# Patient Record
Sex: Male | Born: 1945
Health system: Southern US, Community
[De-identification: ages and names within clinical notes are randomized; demographics above are authoritative.]

## PROBLEM LIST (undated history)

## (undated) DIAGNOSIS — Z87442 Personal history of urinary calculi: Secondary | ICD-10-CM

## (undated) DIAGNOSIS — I499 Cardiac arrhythmia, unspecified: Secondary | ICD-10-CM

## (undated) DIAGNOSIS — D649 Anemia, unspecified: Secondary | ICD-10-CM

## (undated) DIAGNOSIS — E785 Hyperlipidemia, unspecified: Secondary | ICD-10-CM

## (undated) DIAGNOSIS — N400 Enlarged prostate without lower urinary tract symptoms: Secondary | ICD-10-CM

## (undated) DIAGNOSIS — D729 Disorder of white blood cells, unspecified: Secondary | ICD-10-CM

## (undated) DIAGNOSIS — E039 Hypothyroidism, unspecified: Secondary | ICD-10-CM

## (undated) DIAGNOSIS — R001 Bradycardia, unspecified: Secondary | ICD-10-CM

## (undated) DIAGNOSIS — M4800 Spinal stenosis, site unspecified: Secondary | ICD-10-CM

## (undated) DIAGNOSIS — I4891 Unspecified atrial fibrillation: Secondary | ICD-10-CM

## (undated) DIAGNOSIS — I2510 Atherosclerotic heart disease of native coronary artery without angina pectoris: Secondary | ICD-10-CM

## (undated) DIAGNOSIS — K219 Gastro-esophageal reflux disease without esophagitis: Secondary | ICD-10-CM

## (undated) DIAGNOSIS — I219 Acute myocardial infarction, unspecified: Secondary | ICD-10-CM

## (undated) DIAGNOSIS — G4730 Sleep apnea, unspecified: Secondary | ICD-10-CM

## (undated) DIAGNOSIS — C159 Malignant neoplasm of esophagus, unspecified: Secondary | ICD-10-CM

## (undated) DIAGNOSIS — I10 Essential (primary) hypertension: Secondary | ICD-10-CM

## (undated) DIAGNOSIS — Z95820 Peripheral vascular angioplasty status with implants and grafts: Secondary | ICD-10-CM

## (undated) DIAGNOSIS — N200 Calculus of kidney: Secondary | ICD-10-CM

## (undated) HISTORY — PX: CORONARY ANGIOPLASTY: SHX604

## (undated) HISTORY — DX: Hyperlipidemia, unspecified: E78.5

## (undated) HISTORY — DX: Atherosclerotic heart disease of native coronary artery without angina pectoris: I25.10

## (undated) HISTORY — DX: Calculus of kidney: N20.0

## (undated) HISTORY — DX: Spinal stenosis, site unspecified: M48.00

## (undated) HISTORY — PX: HERNIA REPAIR: SHX51

## (undated) HISTORY — DX: Essential (primary) hypertension: I10

## (undated) HISTORY — PX: BACK SURGERY: SHX140

## (undated) HISTORY — DX: Bradycardia, unspecified: R00.1

---

## 2003-09-17 DIAGNOSIS — Z95820 Peripheral vascular angioplasty status with implants and grafts: Secondary | ICD-10-CM

## 2003-09-17 DIAGNOSIS — I219 Acute myocardial infarction, unspecified: Secondary | ICD-10-CM

## 2003-09-17 HISTORY — DX: Acute myocardial infarction, unspecified: I21.9

## 2003-09-17 HISTORY — DX: Peripheral vascular angioplasty status with implants and grafts: Z95.820

## 2005-06-01 ENCOUNTER — Ambulatory Visit: Payer: Self-pay | Admitting: Internal Medicine

## 2005-10-17 ENCOUNTER — Ambulatory Visit (HOSPITAL_COMMUNITY): Admission: RE | Admit: 2005-10-17 | Discharge: 2005-10-18 | Payer: Self-pay | Admitting: Neurosurgery

## 2009-03-10 ENCOUNTER — Emergency Department: Payer: Self-pay | Admitting: Emergency Medicine

## 2011-09-28 ENCOUNTER — Ambulatory Visit: Payer: Self-pay

## 2013-06-03 ENCOUNTER — Ambulatory Visit: Payer: Self-pay | Admitting: Medical

## 2013-06-07 ENCOUNTER — Ambulatory Visit: Payer: Self-pay | Admitting: Physician Assistant

## 2013-06-17 ENCOUNTER — Ambulatory Visit: Payer: Self-pay | Admitting: Physician Assistant

## 2014-09-07 ENCOUNTER — Ambulatory Visit (INDEPENDENT_AMBULATORY_CARE_PROVIDER_SITE_OTHER): Payer: Medicare Other | Admitting: Podiatry

## 2014-09-07 ENCOUNTER — Encounter: Payer: Self-pay | Admitting: Podiatry

## 2014-09-07 ENCOUNTER — Ambulatory Visit (INDEPENDENT_AMBULATORY_CARE_PROVIDER_SITE_OTHER): Payer: Medicare Other

## 2014-09-07 VITALS — BP 133/78 | HR 51 | Resp 16 | Ht 69.0 in | Wt 210.0 lb

## 2014-09-07 DIAGNOSIS — M722 Plantar fascial fibromatosis: Secondary | ICD-10-CM

## 2014-09-07 NOTE — Progress Notes (Signed)
   Subjective:    Patient ID: Corey Perry, male    DOB: 1946/05/28, 68 y.o.   MRN: [id number]  HPI Comments: i want orthotics for my shoes. My toes, heels, lateral sides of both feet hurt. This has been going on since November 2015. They hurt after i get off of work. i have my feet rubbed by my wife every night and soak in epsom salt.      Review of Systems  Hematological: Bruises/bleeds easily.  All other systems reviewed and are negative.      Objective:   Physical Exam: I have reviewed his past mental history medications allergy surgery social history and review of systems. Pulses are strongly palpable bilateral. The logic sensorium is intact per Semmes-Weinstein monofilament. Deep tendon reflexes are intact bilateral and muscle strength is 5 over 5 dorsiflexion plantar flexors and inverters and everters all intrinsic musculature is intact. Orthopedic evaluation of his drains all joints distal to the ankle has a full range of motion without crepitation. Cutaneous evaluation demonstrates supple well-hydrated cutis no erythema edema cellulitis drainage or odor. Radiographic evaluation demonstrates soft tissue increased density at the plantar fascial insertion sites.        Assessment & Plan:  Assessment: Plantar fasciitis bilateral with lateral compensatory syndrome and forefoot capsulitis.  Plan: He was scanned for set of orthotics today I will follow up with him once those come in. He is unable to take NSAIDs.

## 2014-09-28 ENCOUNTER — Ambulatory Visit (INDEPENDENT_AMBULATORY_CARE_PROVIDER_SITE_OTHER): Payer: Medicare Other | Admitting: *Deleted

## 2014-09-28 DIAGNOSIS — M722 Plantar fascial fibromatosis: Secondary | ICD-10-CM

## 2014-09-28 NOTE — Progress Notes (Signed)
Pt presents for orthotic pick up ,written and verbal instructions are given

## 2014-09-28 NOTE — Patient Instructions (Signed)

## 2014-10-26 ENCOUNTER — Ambulatory Visit: Payer: Medicare Other | Admitting: Podiatry

## 2015-06-21 ENCOUNTER — Ambulatory Visit (INDEPENDENT_AMBULATORY_CARE_PROVIDER_SITE_OTHER): Payer: Medicare Other | Admitting: Family Medicine

## 2015-06-21 ENCOUNTER — Encounter: Payer: Self-pay | Admitting: Family Medicine

## 2015-06-21 VITALS — BP 127/68 | HR 52 | Temp 97.8°F | Ht 68.6 in | Wt 213.0 lb

## 2015-06-21 DIAGNOSIS — I10 Essential (primary) hypertension: Secondary | ICD-10-CM | POA: Diagnosis not present

## 2015-06-21 DIAGNOSIS — E079 Disorder of thyroid, unspecified: Secondary | ICD-10-CM

## 2015-06-21 DIAGNOSIS — E785 Hyperlipidemia, unspecified: Secondary | ICD-10-CM | POA: Diagnosis not present

## 2015-06-21 DIAGNOSIS — Z23 Encounter for immunization: Secondary | ICD-10-CM

## 2015-06-21 DIAGNOSIS — K219 Gastro-esophageal reflux disease without esophagitis: Secondary | ICD-10-CM | POA: Diagnosis not present

## 2015-06-21 DIAGNOSIS — E039 Hypothyroidism, unspecified: Secondary | ICD-10-CM | POA: Insufficient documentation

## 2015-06-21 DIAGNOSIS — Z0000 Encounter for general adult medical examination without abnormal findings: Secondary | ICD-10-CM

## 2015-06-21 DIAGNOSIS — I2510 Atherosclerotic heart disease of native coronary artery without angina pectoris: Secondary | ICD-10-CM | POA: Diagnosis not present

## 2015-06-21 DIAGNOSIS — I2583 Coronary atherosclerosis due to lipid rich plaque: Secondary | ICD-10-CM

## 2015-06-21 DIAGNOSIS — Z1211 Encounter for screening for malignant neoplasm of colon: Secondary | ICD-10-CM

## 2015-06-21 DIAGNOSIS — N400 Enlarged prostate without lower urinary tract symptoms: Secondary | ICD-10-CM

## 2015-06-21 LAB — MICROSCOPIC EXAMINATION: Renal Epithel, UA: NONE SEEN /hpf

## 2015-06-21 LAB — URINALYSIS, ROUTINE W REFLEX MICROSCOPIC
Bilirubin, UA: NEGATIVE
Glucose, UA: NEGATIVE
KETONES UA: NEGATIVE
Leukocytes, UA: NEGATIVE
NITRITE UA: NEGATIVE
Protein, UA: NEGATIVE
SPEC GRAV UA: 1.02 (ref 1.005–1.030)
UUROB: 0.2 mg/dL (ref 0.2–1.0)
pH, UA: 5 (ref 5.0–7.5)

## 2015-06-21 MED ORDER — LISINOPRIL 10 MG PO TABS: 10.0000 mg | ORAL_TABLET | Freq: Every day | ORAL | Status: DC

## 2015-06-21 MED ORDER — LEVOTHYROXINE SODIUM 50 MCG PO TABS: 50.0000 ug | ORAL_TABLET | Freq: Every day | ORAL | Status: DC

## 2015-06-21 MED ORDER — SIMVASTATIN 40 MG PO TABS: 40.0000 mg | ORAL_TABLET | Freq: Every day | ORAL | Status: DC

## 2015-06-21 MED ORDER — PANTOPRAZOLE SODIUM 20 MG PO TBEC: 20.0000 mg | DELAYED_RELEASE_TABLET | Freq: Every day | ORAL | Status: DC

## 2015-06-21 NOTE — Assessment & Plan Note (Signed)
The current medical regimen is effective;  continue present plan and medications.  

## 2015-06-21 NOTE — Progress Notes (Signed)
BP 127/68 mmHg  Pulse 52  Temp(Src) 97.8 F (36.6 C)  Ht 5' 8.6" (1.742 m)  Wt 213 lb (96.616 kg)  BMI 31.84 kg/m2  SpO2 96%   Subjective:    Patient ID: Corey Perry, male    DOB: 04/21/1946, 69 y.o.   MRN: [id number]  HPI: Corey Perry is a 69 y.o. male  No chief complaint on file.  patient for annual wellness visit Annual wellness visit metrics met Review of medical problems  Hypertension good control no complaints from medications Hypothyroid doing well on medications Coronary artery disease started on isosorbide no further angina complaints Reflux stable on Protonix  Relevant past medical, surgical, family and social history reviewed and updated as indicated. Interim medical history since our last visit reviewed. Allergies and medications reviewed and updated.  Review of Systems  Constitutional: Negative.   HENT: Negative.   Eyes: Negative.   Respiratory: Negative.   Cardiovascular: Negative.   Gastrointestinal: Negative.   Endocrine: Negative.   Genitourinary: Negative.   Musculoskeletal: Negative.   Skin: Negative.   Allergic/Immunologic: Negative.   Neurological: Negative.   Hematological: Negative.   Psychiatric/Behavioral: Negative.     Per HPI unless specifically indicated above     Objective:    BP 127/68 mmHg  Pulse 52  Temp(Src) 97.8 F (36.6 C)  Ht 5' 8.6" (1.742 m)  Wt 213 lb (96.616 kg)  BMI 31.84 kg/m2  SpO2 96%  Wt Readings from Last 3 Encounters:  06/21/15 213 lb (96.616 kg)  12/20/14 196 lb (88.905 kg)  09/07/14 210 lb (95.255 kg)    Physical Exam  Constitutional: He is oriented to person, place, and time. He appears well-developed and well-nourished.  HENT:  Head: Normocephalic and atraumatic.  Right Ear: External ear normal.  Left Ear: External ear normal.  Eyes: Conjunctivae and EOM are normal. Pupils are equal, round, and reactive to light.  Neck: Normal range of motion. Neck supple.  Cardiovascular: Normal  rate, regular rhythm, normal heart sounds and intact distal pulses.   Pulmonary/Chest: Effort normal and breath sounds normal.  Abdominal: Soft. Bowel sounds are normal. There is no splenomegaly or hepatomegaly.  Genitourinary: Rectum normal and penis normal.  Prostate enlarged  Musculoskeletal: Normal range of motion.  Neurological: He is alert and oriented to person, place, and time. He has normal reflexes.  Skin: No rash noted. No erythema.  Psychiatric: He has a normal mood and affect. His behavior is normal. Judgment and thought content normal.    No results found for this or any previous visit.    Assessment & Plan:   Problem List Items Addressed This Visit      Cardiovascular and Mediastinum   Hypertension   Relevant Medications   isosorbide mononitrate (IMDUR) 30 MG 24 hr tablet   simvastatin (ZOCOR) 40 MG tablet   lisinopril (PRINIVIL,ZESTRIL) 10 MG tablet   Other Relevant Orders   Comprehensive metabolic panel   CBC with Differential/Platelet   Urinalysis, Routine w reflex microscopic (not at [facility])   CAD (coronary artery disease)   Relevant Medications   isosorbide mononitrate (IMDUR) 30 MG 24 hr tablet   simvastatin (ZOCOR) 40 MG tablet   lisinopril (PRINIVIL,ZESTRIL) 10 MG tablet   Other Relevant Orders   Comprehensive metabolic panel   Lipid panel   CBC with Differential/Platelet     Digestive   Acid reflux    The current medical regimen is effective;  continue present plan and medications.  Relevant Medications   pantoprazole (PROTONIX) 20 MG tablet     Endocrine   Hypothyroid   Relevant Medications   levothyroxine (SYNTHROID, LEVOTHROID) 50 MCG tablet     Genitourinary   BPH (benign prostatic hyperplasia)   Relevant Orders   PSA   Urinalysis, Routine w reflex microscopic (not at [facility])     Other   Hyperlipidemia   Relevant Medications   isosorbide mononitrate (IMDUR) 30 MG 24 hr tablet   simvastatin (ZOCOR) 40 MG tablet   lisinopril  (PRINIVIL,ZESTRIL) 10 MG tablet   Other Relevant Orders   Lipid panel   CBC with Differential/Platelet    Other Visit Diagnoses    Immunization due    -  Primary    Relevant Orders    Flu Vaccine QUAD 36+ mos PF IM (Fluarix & Fluzone Quad PF) (Completed)    Colon cancer screening        Relevant Orders    Ambulatory referral to General Surgery    PE (physical exam), annual            Follow up plan: Return in about 6 months (around 12/20/2015), or if symptoms worsen or fail to improve, for Recheck medical problems, BMP, lipid panel, ALT, AST.

## 2015-06-22 ENCOUNTER — Other Ambulatory Visit: Payer: Self-pay

## 2015-06-22 ENCOUNTER — Telehealth: Payer: Self-pay | Admitting: Family Medicine

## 2015-06-22 ENCOUNTER — Encounter: Payer: Self-pay | Admitting: Family Medicine

## 2015-06-22 ENCOUNTER — Telehealth: Payer: Self-pay

## 2015-06-22 DIAGNOSIS — D649 Anemia, unspecified: Secondary | ICD-10-CM

## 2015-06-22 DIAGNOSIS — I129 Hypertensive chronic kidney disease with stage 1 through stage 4 chronic kidney disease, or unspecified chronic kidney disease: Secondary | ICD-10-CM | POA: Insufficient documentation

## 2015-06-22 DIAGNOSIS — N183 Chronic kidney disease, stage 3 (moderate): Secondary | ICD-10-CM

## 2015-06-22 LAB — CBC WITH DIFFERENTIAL/PLATELET
BASOS: 0 %
Basophils Absolute: 0 10*3/uL (ref 0.0–0.2)
EOS (ABSOLUTE): 0.1 10*3/uL (ref 0.0–0.4)
EOS: 2 %
HEMATOCRIT: 40.8 % (ref 37.5–51.0)
Hemoglobin: 14.5 g/dL (ref 12.6–17.7)
Immature Grans (Abs): 0 10*3/uL (ref 0.0–0.1)
Immature Granulocytes: 0 %
LYMPHS ABS: 1.2 10*3/uL (ref 0.7–3.1)
Lymphs: 43 %
MCH: 34.8 pg — ABNORMAL HIGH (ref 26.6–33.0)
MCHC: 35.5 g/dL (ref 31.5–35.7)
MCV: 98 fL — AB (ref 79–97)
MONOS ABS: 0.3 10*3/uL (ref 0.1–0.9)
Monocytes: 10 %
Neutrophils Absolute: 1.2 10*3/uL — ABNORMAL LOW (ref 1.4–7.0)
Neutrophils: 45 %
Platelets: 120 10*3/uL — ABNORMAL LOW (ref 150–379)
RBC: 4.17 x10E6/uL (ref 4.14–5.80)
RDW: 12.9 % (ref 12.3–15.4)
WBC: 2.7 10*3/uL — ABNORMAL LOW (ref 3.4–10.8)

## 2015-06-22 LAB — LIPID PANEL
CHOLESTEROL TOTAL: 130 mg/dL (ref 100–199)
Chol/HDL Ratio: 4.3 ratio units (ref 0.0–5.0)
HDL: 30 mg/dL — AB (ref >39)
LDL Calculated: 61 mg/dL (ref 0–99)
Triglycerides: 195 mg/dL — ABNORMAL HIGH (ref 0–149)
VLDL CHOLESTEROL CAL: 39 mg/dL (ref 5–40)

## 2015-06-22 LAB — COMPREHENSIVE METABOLIC PANEL
ALT: 29 IU/L (ref 0–44)
AST: 27 IU/L (ref 0–40)
Albumin/Globulin Ratio: 2.3 (ref 1.1–2.5)
Albumin: 4.8 g/dL (ref 3.6–4.8)
Alkaline Phosphatase: 46 IU/L (ref 39–117)
BILIRUBIN TOTAL: 0.6 mg/dL (ref 0.0–1.2)
BUN/Creatinine Ratio: 12 (ref 10–22)
BUN: 19 mg/dL (ref 8–27)
CHLORIDE: 101 mmol/L (ref 97–108)
CO2: 23 mmol/L (ref 18–29)
Calcium: 9.4 mg/dL (ref 8.6–10.2)
Creatinine, Ser: 1.55 mg/dL — ABNORMAL HIGH (ref 0.76–1.27)
GFR calc non Af Amer: 45 mL/min/{1.73_m2} — ABNORMAL LOW (ref >59)
GFR, EST AFRICAN AMERICAN: 52 mL/min/{1.73_m2} — AB (ref >59)
GLUCOSE: 93 mg/dL (ref 65–99)
Globulin, Total: 2.1 g/dL (ref 1.5–4.5)
Potassium: 4.2 mmol/L (ref 3.5–5.2)
Sodium: 140 mmol/L (ref 134–144)
TOTAL PROTEIN: 6.9 g/dL (ref 6.0–8.5)

## 2015-06-22 LAB — PSA: Prostate Specific Ag, Serum: 0.4 ng/mL (ref 0.0–4.0)

## 2015-06-22 LAB — TSH: TSH: 3.42 u[IU]/mL (ref 0.450–4.500)

## 2015-06-22 NOTE — Telephone Encounter (Signed)
Phone call Discussed with patient WBC drifting downward Patient will come back for repeat CBC with white count still down we will refer to hematology.

## 2015-06-22 NOTE — Telephone Encounter (Signed)
-----   Message from Wynn Maudlin, [place] sent at 06/22/2015 12:06 PM EDT ----- labs

## 2015-06-22 NOTE — Telephone Encounter (Signed)
Gastroenterology Pre-Procedure Review  Request Date: 07/13/15 Requesting Physician: Dr. Jeananne Rama  PATIENT REVIEW QUESTIONS: The patient responded to the following health history questions as indicated:    1. Are you having any GI issues? no 2. Do you have a personal history of Polyps? no 3. Do you have a family history of Colon Cancer or Polyps? no 4. Diabetes Mellitus? no 5. Joint replacements in the past 12 months?no 6. Major health problems in the past 3 months?no 7. Any artificial heart valves, MVP, or defibrillator?yes (Stents x 4 in 2005)    MEDICATIONS & ALLERGIES:    Patient reports the following regarding taking any anticoagulation/antiplatelet therapy:   Plavix, Coumadin, Eliquis, Xarelto, Lovenox, Pradaxa, Brilinta, or Effient? yes (Plavix '75mg'$ ) Aspirin? yes (ASA '81mg'$ )  Patient confirms/reports the following medications:  Current Outpatient Prescriptions  Medication Sig Dispense Refill  . aspirin 81 MG tablet Take 81 mg by mouth daily.    . clopidogrel (PLAVIX) 75 MG tablet Take by mouth.    . isosorbide mononitrate (IMDUR) 30 MG 24 hr tablet 30 mg daily.  5  . levothyroxine (SYNTHROID, LEVOTHROID) 50 MCG tablet Take 1 tablet (50 mcg total) by mouth daily. 90 tablet 4  . lisinopril (PRINIVIL,ZESTRIL) 10 MG tablet Take 1 tablet (10 mg total) by mouth daily. 90 tablet 4  . pantoprazole (PROTONIX) 20 MG tablet Take 1 tablet (20 mg total) by mouth daily. 90 tablet 4  . simvastatin (ZOCOR) 40 MG tablet Take 1 tablet (40 mg total) by mouth daily. 90 tablet 4   No current facility-administered medications for this visit.    Patient confirms/reports the following allergies:  No Known Allergies  No orders of the defined types were placed in this encounter.    AUTHORIZATION INFORMATION Primary Insurance: 1D#: Group #:  Secondary Insurance: 1D#: Group #:  SCHEDULE INFORMATION: Date: 07/13/15 Time: Location: [place]

## 2015-06-28 ENCOUNTER — Other Ambulatory Visit: Payer: Medicaid Other

## 2015-06-28 DIAGNOSIS — D649 Anemia, unspecified: Secondary | ICD-10-CM

## 2015-06-29 ENCOUNTER — Telehealth: Payer: Self-pay | Admitting: Family Medicine

## 2015-06-29 DIAGNOSIS — D72818 Other decreased white blood cell count: Secondary | ICD-10-CM

## 2015-06-29 DIAGNOSIS — D72819 Decreased white blood cell count, unspecified: Secondary | ICD-10-CM

## 2015-06-29 LAB — CBC WITH DIFFERENTIAL/PLATELET
BASOS: 0 %
Basophils Absolute: 0 10*3/uL (ref 0.0–0.2)
EOS (ABSOLUTE): 0 10*3/uL (ref 0.0–0.4)
Eos: 2 %
Hematocrit: 39.5 % (ref 37.5–51.0)
Hemoglobin: 13.4 g/dL (ref 12.6–17.7)
IMMATURE GRANS (ABS): 0 10*3/uL (ref 0.0–0.1)
IMMATURE GRANULOCYTES: 0 %
LYMPHS: 32 %
Lymphocytes Absolute: 0.8 10*3/uL (ref 0.7–3.1)
MCH: 34.1 pg — ABNORMAL HIGH (ref 26.6–33.0)
MCHC: 33.9 g/dL (ref 31.5–35.7)
MCV: 101 fL — AB (ref 79–97)
Monocytes Absolute: 0.3 10*3/uL (ref 0.1–0.9)
Monocytes: 14 %
NEUTROS PCT: 52 %
Neutrophils Absolute: 1.2 10*3/uL — ABNORMAL LOW (ref 1.4–7.0)
PLATELETS: 104 10*3/uL — AB (ref 150–379)
RBC: 3.93 x10E6/uL — ABNORMAL LOW (ref 4.14–5.80)
RDW: 14.1 % (ref 12.3–15.4)
WBC: 2.4 10*3/uL — CL (ref 3.4–10.8)

## 2015-06-29 NOTE — Telephone Encounter (Signed)
Phone call Discussed with patient WBC dropping. Will refer to hematology to further evaluate.

## 2015-06-29 NOTE — Telephone Encounter (Signed)
Pt was referred to the cancer center at [facility] peen and it should be at [place]

## 2015-07-05 ENCOUNTER — Telehealth: Payer: Self-pay | Admitting: Family Medicine

## 2015-07-05 ENCOUNTER — Encounter: Payer: Self-pay | Admitting: *Deleted

## 2015-07-05 NOTE — Telephone Encounter (Signed)
Pts wife called, pt having colo 07/13/15, questions regarding plavix and aspirin.  Please call.

## 2015-07-12 ENCOUNTER — Inpatient Hospital Stay: Payer: Medicare Other | Attending: Oncology | Admitting: Oncology

## 2015-07-12 ENCOUNTER — Inpatient Hospital Stay: Payer: Medicare Other

## 2015-07-12 VITALS — BP 159/83 | HR 49 | Temp 96.5°F | Ht 69.0 in | Wt 215.6 lb

## 2015-07-12 DIAGNOSIS — G4730 Sleep apnea, unspecified: Secondary | ICD-10-CM | POA: Diagnosis not present

## 2015-07-12 DIAGNOSIS — Z79899 Other long term (current) drug therapy: Secondary | ICD-10-CM | POA: Insufficient documentation

## 2015-07-12 DIAGNOSIS — D72819 Decreased white blood cell count, unspecified: Secondary | ICD-10-CM | POA: Insufficient documentation

## 2015-07-12 DIAGNOSIS — N400 Enlarged prostate without lower urinary tract symptoms: Secondary | ICD-10-CM | POA: Insufficient documentation

## 2015-07-12 DIAGNOSIS — I2510 Atherosclerotic heart disease of native coronary artery without angina pectoris: Secondary | ICD-10-CM | POA: Insufficient documentation

## 2015-07-12 DIAGNOSIS — K219 Gastro-esophageal reflux disease without esophagitis: Secondary | ICD-10-CM | POA: Diagnosis not present

## 2015-07-12 DIAGNOSIS — I129 Hypertensive chronic kidney disease with stage 1 through stage 4 chronic kidney disease, or unspecified chronic kidney disease: Secondary | ICD-10-CM | POA: Insufficient documentation

## 2015-07-12 DIAGNOSIS — R001 Bradycardia, unspecified: Secondary | ICD-10-CM | POA: Diagnosis not present

## 2015-07-12 DIAGNOSIS — D696 Thrombocytopenia, unspecified: Secondary | ICD-10-CM | POA: Insufficient documentation

## 2015-07-12 DIAGNOSIS — M4806 Spinal stenosis, lumbar region: Secondary | ICD-10-CM | POA: Diagnosis not present

## 2015-07-12 DIAGNOSIS — E039 Hypothyroidism, unspecified: Secondary | ICD-10-CM | POA: Insufficient documentation

## 2015-07-12 DIAGNOSIS — Z87891 Personal history of nicotine dependence: Secondary | ICD-10-CM | POA: Insufficient documentation

## 2015-07-12 DIAGNOSIS — E785 Hyperlipidemia, unspecified: Secondary | ICD-10-CM | POA: Diagnosis not present

## 2015-07-12 DIAGNOSIS — Z7982 Long term (current) use of aspirin: Secondary | ICD-10-CM

## 2015-07-12 DIAGNOSIS — N189 Chronic kidney disease, unspecified: Secondary | ICD-10-CM | POA: Diagnosis not present

## 2015-07-12 LAB — CBC
HEMATOCRIT: 43 % (ref 40.0–52.0)
HEMOGLOBIN: 14.5 g/dL (ref 13.0–18.0)
MCH: 33.9 pg (ref 26.0–34.0)
MCHC: 33.8 g/dL (ref 32.0–36.0)
MCV: 100.2 fL — AB (ref 80.0–100.0)
Platelets: 119 10*3/uL — ABNORMAL LOW (ref 150–440)
RBC: 4.29 MIL/uL — AB (ref 4.40–5.90)
RDW: 13.3 % (ref 11.5–14.5)
WBC: 2.9 10*3/uL — ABNORMAL LOW (ref 3.8–10.6)

## 2015-07-12 LAB — IRON AND TIBC
Iron: 106 ug/dL (ref 45–182)
SATURATION RATIOS: 34 % (ref 17.9–39.5)
TIBC: 312 ug/dL (ref 250–450)
UIBC: 207 ug/dL

## 2015-07-12 LAB — FOLATE: Folate: 43 ng/mL (ref >5.9)

## 2015-07-12 LAB — FERRITIN: Ferritin: 101 ng/mL (ref 24–336)

## 2015-07-12 LAB — VITAMIN B12: VITAMIN B 12: 320 pg/mL (ref 180–914)

## 2015-07-12 LAB — LACTATE DEHYDROGENASE: LDH: 101 U/L (ref 98–192)

## 2015-07-12 NOTE — Progress Notes (Signed)
Pt Alert x 3, amb ind w/o assistance.  Denies pain or discomfort.  Wife in room with pt.

## 2015-07-13 ENCOUNTER — Encounter
Admission: RE | Disposition: A | Discharge status: Home / Self Care | Payer: Self-pay | Source: Ambulatory Visit | Attending: Gastroenterology

## 2015-07-13 ENCOUNTER — Ambulatory Visit: Payer: Medicare Other | Admitting: Student in an Organized Health Care Education/Training Program

## 2015-07-13 ENCOUNTER — Ambulatory Visit
Admission: RE | Admit: 2015-07-13 | Discharge: 2015-07-13 | Disposition: A | Discharge status: Home / Self Care | Payer: Medicare Other | Source: Ambulatory Visit | Attending: Gastroenterology | Admitting: Gastroenterology

## 2015-07-13 ENCOUNTER — Other Ambulatory Visit: Payer: Self-pay | Admitting: Gastroenterology

## 2015-07-13 ENCOUNTER — Encounter: Payer: Self-pay | Admitting: *Deleted

## 2015-07-13 DIAGNOSIS — N400 Enlarged prostate without lower urinary tract symptoms: Secondary | ICD-10-CM | POA: Insufficient documentation

## 2015-07-13 DIAGNOSIS — E039 Hypothyroidism, unspecified: Secondary | ICD-10-CM | POA: Insufficient documentation

## 2015-07-13 DIAGNOSIS — I1310 Hypertensive heart and chronic kidney disease without heart failure, with stage 1 through stage 4 chronic kidney disease, or unspecified chronic kidney disease: Secondary | ICD-10-CM | POA: Insufficient documentation

## 2015-07-13 DIAGNOSIS — N189 Chronic kidney disease, unspecified: Secondary | ICD-10-CM | POA: Diagnosis not present

## 2015-07-13 DIAGNOSIS — Z1211 Encounter for screening for malignant neoplasm of colon: Secondary | ICD-10-CM

## 2015-07-13 DIAGNOSIS — Z841 Family history of disorders of kidney and ureter: Secondary | ICD-10-CM | POA: Diagnosis not present

## 2015-07-13 DIAGNOSIS — Z8249 Family history of ischemic heart disease and other diseases of the circulatory system: Secondary | ICD-10-CM | POA: Insufficient documentation

## 2015-07-13 DIAGNOSIS — G4730 Sleep apnea, unspecified: Secondary | ICD-10-CM | POA: Diagnosis not present

## 2015-07-13 DIAGNOSIS — Z79899 Other long term (current) drug therapy: Secondary | ICD-10-CM | POA: Diagnosis not present

## 2015-07-13 DIAGNOSIS — Z955 Presence of coronary angioplasty implant and graft: Secondary | ICD-10-CM | POA: Diagnosis not present

## 2015-07-13 DIAGNOSIS — I2510 Atherosclerotic heart disease of native coronary artery without angina pectoris: Secondary | ICD-10-CM | POA: Insufficient documentation

## 2015-07-13 DIAGNOSIS — D122 Benign neoplasm of ascending colon: Secondary | ICD-10-CM | POA: Diagnosis not present

## 2015-07-13 DIAGNOSIS — M4806 Spinal stenosis, lumbar region: Secondary | ICD-10-CM | POA: Insufficient documentation

## 2015-07-13 DIAGNOSIS — Z833 Family history of diabetes mellitus: Secondary | ICD-10-CM | POA: Diagnosis not present

## 2015-07-13 DIAGNOSIS — K621 Rectal polyp: Secondary | ICD-10-CM | POA: Insufficient documentation

## 2015-07-13 DIAGNOSIS — Z87891 Personal history of nicotine dependence: Secondary | ICD-10-CM | POA: Diagnosis not present

## 2015-07-13 DIAGNOSIS — K219 Gastro-esophageal reflux disease without esophagitis: Secondary | ICD-10-CM | POA: Insufficient documentation

## 2015-07-13 DIAGNOSIS — D729 Disorder of white blood cells, unspecified: Secondary | ICD-10-CM | POA: Insufficient documentation

## 2015-07-13 DIAGNOSIS — K641 Second degree hemorrhoids: Secondary | ICD-10-CM | POA: Insufficient documentation

## 2015-07-13 DIAGNOSIS — R001 Bradycardia, unspecified: Secondary | ICD-10-CM | POA: Insufficient documentation

## 2015-07-13 DIAGNOSIS — E785 Hyperlipidemia, unspecified: Secondary | ICD-10-CM | POA: Insufficient documentation

## 2015-07-13 DIAGNOSIS — Z7982 Long term (current) use of aspirin: Secondary | ICD-10-CM | POA: Diagnosis not present

## 2015-07-13 DIAGNOSIS — K5730 Diverticulosis of large intestine without perforation or abscess without bleeding: Secondary | ICD-10-CM | POA: Diagnosis not present

## 2015-07-13 HISTORY — DX: Disorder of white blood cells, unspecified: D72.9

## 2015-07-13 HISTORY — DX: Cardiac arrhythmia, unspecified: I49.9

## 2015-07-13 HISTORY — PX: COLONOSCOPY WITH PROPOFOL: SHX5780

## 2015-07-13 HISTORY — DX: Gastro-esophageal reflux disease without esophagitis: K21.9

## 2015-07-13 HISTORY — DX: Hypothyroidism, unspecified: E03.9

## 2015-07-13 HISTORY — PX: POLYPECTOMY: SHX5525

## 2015-07-13 HISTORY — DX: Sleep apnea, unspecified: G47.30

## 2015-07-13 HISTORY — DX: Benign prostatic hyperplasia without lower urinary tract symptoms: N40.0

## 2015-07-13 LAB — PROTEIN ELECTROPHORESIS, SERUM
A/G RATIO SPE: 1.4 (ref 0.7–1.7)
ALBUMIN ELP: 4.1 g/dL (ref 2.9–4.4)
ALPHA-1-GLOBULIN: 0.2 g/dL (ref 0.0–0.4)
Alpha-2-Globulin: 0.6 g/dL (ref 0.4–1.0)
Beta Globulin: 1.1 g/dL (ref 0.7–1.3)
GAMMA GLOBULIN: 1.1 g/dL (ref 0.4–1.8)
GLOBULIN, TOTAL: 2.9 g/dL (ref 2.2–3.9)
TOTAL PROTEIN ELP: 7 g/dL (ref 6.0–8.5)

## 2015-07-13 SURGERY — COLONOSCOPY WITH PROPOFOL
Anesthesia: Monitor Anesthesia Care | Wound class: Contaminated

## 2015-07-13 MED ORDER — LACTATED RINGERS IV SOLN
INTRAVENOUS | Status: DC
  Administered 2015-07-13: 09:00:00 via INTRAVENOUS

## 2015-07-13 MED ORDER — PROPOFOL 10 MG/ML IV BOLUS
INTRAVENOUS | Status: DC | PRN
Start: 2015-07-13 — End: 2015-07-13
  Administered 2015-07-13: 50 mg via INTRAVENOUS
  Administered 2015-07-13: 40 mg via INTRAVENOUS
  Administered 2015-07-13: 100 mg via INTRAVENOUS
  Administered 2015-07-13 (×2): 30 mg via INTRAVENOUS

## 2015-07-13 MED ORDER — SIMETHICONE 40 MG/0.6ML PO SUSP
ORAL | Status: DC | PRN
  Administered 2015-07-13: 10:00:00

## 2015-07-13 MED ORDER — LIDOCAINE HCL (CARDIAC) 20 MG/ML IV SOLN
INTRAVENOUS | Status: DC | PRN
  Administered 2015-07-13: 40 mg via INTRAVENOUS

## 2015-07-13 SURGICAL SUPPLY — 28 items
CANISTER SUCT 1200ML W/VALVE (MISCELLANEOUS) ×4 IMPLANT
FCP ESCP3.2XJMB 240X2.8X (MISCELLANEOUS)
FORCEPS BIOP RAD 4 LRG CAP 4 (CUTTING FORCEPS) ×2 IMPLANT
FORCEPS BIOP RJ4 240 W/NDL (MISCELLANEOUS)
FORCEPS ESCP3.2XJMB 240X2.8X (MISCELLANEOUS) IMPLANT
GOWN CVR UNV OPN BCK APRN NK (MISCELLANEOUS) ×4 IMPLANT
GOWN ISOL THUMB LOOP REG UNIV (MISCELLANEOUS) ×8
HEMOCLIP INSTINCT (CLIP) IMPLANT
INJECTOR VARIJECT VIN23 (MISCELLANEOUS) IMPLANT
KIT CO2 TUBING (TUBING) IMPLANT
KIT DEFENDO VALVE AND CONN (KITS) IMPLANT
KIT ENDO PROCEDURE OLY (KITS) ×4 IMPLANT
LIGATOR MULTIBAND 6SHOOTER MBL (MISCELLANEOUS) IMPLANT
MARKER SPOT ENDO TATTOO 5ML (MISCELLANEOUS) IMPLANT
PAD GROUND ADULT SPLIT (MISCELLANEOUS) IMPLANT
SNARE SHORT THROW 13M SML OVAL (MISCELLANEOUS) IMPLANT
SNARE SHORT THROW 30M LRG OVAL (MISCELLANEOUS) IMPLANT
SPOT EX ENDOSCOPIC TATTOO (MISCELLANEOUS)
SUCTION POLY TRAP 4CHAMBER (MISCELLANEOUS) IMPLANT
TRAP SUCTION POLY (MISCELLANEOUS) IMPLANT
TUBING CONN 6MMX3.1M (TUBING)
TUBING SUCTION CONN 0.25 STRL (TUBING) IMPLANT
UNDERPAD 30X60 958B10 (PK) (MISCELLANEOUS) IMPLANT
VALVE BIOPSY ENDO (VALVE) IMPLANT
VARIJECT INJECTOR VIN23 (MISCELLANEOUS)
WATER AUXILLARY (MISCELLANEOUS) IMPLANT
WATER STERILE IRR 250ML POUR (IV SOLUTION) ×4 IMPLANT
WATER STERILE IRR 500ML POUR (IV SOLUTION) IMPLANT

## 2015-07-13 NOTE — H&P (Signed)
[facility]  [address] [place], [place] [address] Phone: [phone] Fax : [phone]  Primary Care Physician:  Golden Pop, MD Primary Gastroenterologist:  Dr. Allen Norris  Pre-Procedure History & Physical: HPI:  Corey Perry is a 69 y.o. male is here for a screening colonoscopy.   Past Medical History  Diagnosis Date  . Hyperlipidemia   . Hypertension   . Chronic kidney disease   . Bradycardia   . CAD (coronary artery disease)   . Spinal stenosis     lumbar  . GERD (gastroesophageal reflux disease)   . Hypothyroidism   . Dysrhythmia     bradycardia - sees Dr. Humphrey Rolls  . BPH (benign prostatic hypertrophy)   . Sleep apnea     mild - SS - Care everywhere -04/18/07  . Abnormal white blood cell (WBC) count     seeing Dr. [name] at [place] on 10/25    Past Surgical History  Procedure Laterality Date  . Coronary angioplasty  10/05 and 12/05    4 DES placed  . Back surgery    . Hernia repair      x3    Prior to Admission medications   Medication Sig Start Date End Date Taking? Authorizing Provider  clopidogrel (PLAVIX) 75 MG tablet Take 75 mg by mouth. AM 03/04/06  Yes Historical Provider, MD  isosorbide mononitrate (IMDUR) 30 MG 24 hr tablet 30 mg daily. AM 06/12/15  Yes Historical Provider, MD  levothyroxine (SYNTHROID, LEVOTHROID) 50 MCG tablet Take 1 tablet (50 mcg total) by mouth daily. 06/21/15  Yes [name], MD  lisinopril (PRINIVIL,ZESTRIL) 10 MG tablet Take 1 tablet (10 mg total) by mouth daily. 06/21/15  Yes [name], MD  multivitamin-iron-minerals-folic acid (CENTRUM) chewable tablet Chew 1 tablet by mouth daily.   Yes Historical Provider, MD  pantoprazole (PROTONIX) 20 MG tablet Take 1 tablet (20 mg total) by mouth daily. 06/21/15  Yes [name], MD  simvastatin (ZOCOR) 40 MG tablet Take 1 tablet (40 mg total) by mouth daily. 06/21/15  Yes [name], MD  aspirin 81 MG tablet Take 81 mg by mouth daily. AM     Historical Provider, MD    Allergies as of 06/22/2015  . (No Known Allergies)    Family History  Problem Relation Age of Onset  . Hypertension Mother   . Heart disease Father   . Hypertension Father   . Kidney disease Father   . Hypertension Brother   . Diabetes Son     Social History   Social History  . Marital Status: Married    Spouse Name: N/A  . Number of Children: N/A  . Years of Education: N/A   Occupational History  . Not on file.   Social History Main Topics  . Smoking status: Former Smoker    Types: Cigarettes    Quit date: 07/16/2004  . Smokeless tobacco: Former Systems developer    Types: Chew  . Alcohol Use: No  . Drug Use: No  . Sexual Activity: Not on file   Other Topics Concern  . Not on file   Social History Narrative    Review of Systems: See HPI, otherwise negative ROS  Physical Exam: BP 153/79 mmHg  Pulse 52  Temp(Src) 98.2 F (36.8 C)  Resp 16  Ht '5\' 9"'$  (1.753 m)  Wt 213 lb (96.616 kg)  BMI 31.44 kg/m2  SpO2 97% General:   Alert,  pleasant and cooperative in NAD Head:  Normocephalic and atraumatic. Neck:  Supple; no masses or thyromegaly. Lungs:  Clear throughout to auscultation.    Heart:  Regular rate and rhythm. Abdomen:  Soft, nontender and nondistended. Normal bowel sounds, without guarding, and without rebound.   Neurologic:  Alert and  oriented x4;  grossly normal neurologically.  Impression/Plan: Corey Perry is now here to undergo a screening colonoscopy.  Risks, benefits, and alternatives regarding colonoscopy have been reviewed with the patient.  Questions have been answered.  All parties agreeable.

## 2015-07-13 NOTE — Anesthesia Preprocedure Evaluation (Signed)
Anesthesia Evaluation  Patient identified by MRN, date of birth, ID band  Reviewed: Allergy & Precautions, H&P , NPO status , Patient's Chart, lab work & pertinent test results  Airway Mallampati: II  TM Distance: >3 FB Neck ROM: full    Dental no notable dental hx.    Pulmonary sleep apnea , former smoker,    Pulmonary exam normal        Cardiovascular hypertension, + CAD   Rhythm:regular Rate:Normal     Neuro/Psych    GI/Hepatic GERD  ,  Endo/Other  Hypothyroidism   Renal/GU      Musculoskeletal   Abdominal   Peds  Hematology   Anesthesia Other Findings   Reproductive/Obstetrics                             Anesthesia Physical Anesthesia Plan  ASA: II  Anesthesia Plan: MAC   Post-op Pain Management:    Induction:   Airway Management Planned:   Additional Equipment:   Intra-op Plan:   Post-operative Plan:   Informed Consent: I have reviewed the patients History and Physical, chart, labs and discussed the procedure including the risks, benefits and alternatives for the proposed anesthesia with the patient or authorized representative who has indicated his/her understanding and acceptance.     Plan Discussed with: CRNA  Anesthesia Plan Comments:         Anesthesia Quick Evaluation

## 2015-07-13 NOTE — Op Note (Signed)
[facility] Patient Name: Corey Perry Procedure Date: 07/13/2015 9:33 AM MRN: [id number] Account #: [id number] Date of Birth: 07/03/1946 Admit Type: Outpatient Age: 69 Room: [facility] OR ROOM 01 Gender: Male Note Status: Finalized Procedure:         Colonoscopy Indications:       Screening for colorectal malignant neoplasm Providers:         Lucilla Lame, MD Referring MD:      [name], MD (Referring MD) Medicines:         Propofol per Anesthesia Complications:     No immediate complications. Procedure:         Pre-Anesthesia Assessment:                    - Prior to the procedure, a History and Physical was                     performed, and patient medications and allergies were                     reviewed. [name] tolerance of previous anesthesia                     was also reviewed. The risks and benefits of the procedure                     and the sedation options and risks were discussed with the                     patient. All questions were answered, and informed consent                     was obtained. Prior Anticoagulants: The patient has taken                     no previous anticoagulant or antiplatelet agents. ASA                     Grade Assessment: II - A patient with mild systemic                     disease. After reviewing the risks and benefits, the                     patient was deemed in satisfactory condition to undergo                     the procedure.                    After obtaining informed consent, the colonoscope was                     passed under direct vision. Throughout the procedure, the                     patient's blood pressure, pulse, and oxygen saturations                     were monitored continuously. The Olympus CF H180AL                     colonoscope (S#: [id number]) was introduced through the anus  and advanced to the the cecum, identified by appendiceal                      orifice and ileocecal valve. The colonoscopy was performed                     without difficulty. The patient tolerated the procedure                     well. The quality of the bowel preparation was excellent. Findings:      The perianal and digital rectal examinations were normal.      A 4 mm polyp was found in the ascending colon. The polyp was sessile.       The polyp was removed with a cold biopsy forceps. Resection and       retrieval were complete.      A 4 mm polyp was found in the rectum. The polyp was sessile. The polyp       was removed with a cold biopsy forceps. Resection and retrieval were       complete.      Non-bleeding internal hemorrhoids were found during retroflexion. The       hemorrhoids were Grade II (internal hemorrhoids that prolapse but reduce       spontaneously).      Multiple small-mouthed diverticula were found in the entire colon. Impression:        - One 4 mm polyp in the ascending colon. Resected and                     retrieved.                    - One 4 mm polyp in the rectum. Resected and retrieved.                    - Non-bleeding internal hemorrhoids.                    - Diverticulosis in the entire examined colon. Recommendation:    - Repeat colonoscopy in 5 years if polyp adenoma and 10                     years if hyperplastic Procedure Code(s): --- Professional ---                    [phone], Colonoscopy, flexible; with biopsy, single or                     multiple Diagnosis Code(s): --- Professional ---                    Z12.11, Encounter for screening for malignant neoplasm of                     colon                    D12.2, Benign neoplasm of ascending colon                    K62.1, Rectal polyp CPT copyright 2014 American [employer] Association. All rights reserved. The codes documented in this report are preliminary and upon coder review may  be revised to meet current compliance requirements. Lucilla Lame,  MD 07/13/2015 9:50:13 AM This report has been signed electronically. Number  of Addenda: 0 Note Initiated On: 07/13/2015 9:33 AM Scope Withdrawal Time: 0 hours 7 minutes 20 seconds  Total Procedure Duration: 0 hours 10 minutes 10 seconds       [facility]

## 2015-07-13 NOTE — Anesthesia Postprocedure Evaluation (Signed)
  Anesthesia Post-op Note  Patient: Corey Perry  Procedure(s) Performed: Procedure(s): COLONOSCOPY WITH PROPOFOL (N/A) POLYPECTOMY  Anesthesia type:MAC  Patient location: PACU  Post pain: Pain level controlled  Post assessment: Post-op Vital signs reviewed, Patient's Cardiovascular Status Stable, Respiratory Function Stable, Patent Airway and No signs of Nausea or vomiting  Post vital signs: Reviewed and stable  Last Vitals:  Filed Vitals:   07/13/15 1000  BP: 131/69  Pulse:   Temp:   Resp:     Level of consciousness: awake, alert  and patient cooperative  Complications: No apparent anesthesia complications

## 2015-07-13 NOTE — Transfer of Care (Signed)
Immediate Anesthesia Transfer of Care Note  Patient: Corey Perry  Procedure(s) Performed: Procedure(s): COLONOSCOPY WITH PROPOFOL (N/A) POLYPECTOMY  Patient Location: PACU  Anesthesia Type: MAC  Level of Consciousness: awake, alert  and patient cooperative  Airway and Oxygen Therapy: Patient Spontanous Breathing and Patient connected to supplemental oxygen  Post-op Assessment: Post-op Vital signs reviewed, Patient's Cardiovascular Status Stable, Respiratory Function Stable, Patent Airway and No signs of Nausea or vomiting  Post-op Vital Signs: Reviewed and stable  Complications: No apparent anesthesia complications

## 2015-07-13 NOTE — Anesthesia Procedure Notes (Signed)
Procedure Name: MAC Performed by: Namrata Dangler Pre-anesthesia Checklist: Patient identified, Emergency Drugs available, Suction available, Timeout performed and Patient being monitored Patient Re-evaluated:Patient Re-evaluated prior to inductionOxygen Delivery Method: Nasal cannula Placement Confirmation: positive ETCO2     

## 2015-07-14 ENCOUNTER — Encounter: Payer: Self-pay | Admitting: Gastroenterology

## 2015-07-17 ENCOUNTER — Encounter: Payer: Self-pay | Admitting: Gastroenterology

## 2015-07-19 LAB — PLATELET ANTIBODY PROFILE, SERUM
HLA Ab Ser Ql EIA: NEGATIVE
IA/IIA Antibody: NEGATIVE
IB/IX ANTIBODY: NEGATIVE
IIB/IIIA Antibody: NEGATIVE

## 2015-07-19 LAB — NEUTROPHIL AB TEST LEVEL 1: NEUTROPHIL SCR/PANEL INTERP.: NEGATIVE

## 2015-07-21 NOTE — Progress Notes (Signed)
[place]  [phone]) [phone] [phone]) [phone]  ID: [name] OB: 1945/12/19  MR#: [id number]  JOA#:[id number]  Patient Care Team: [name], MD as PCP - General (Family Medicine) [name], MD as PCP - Family Medicine (Family Medicine) [name], MD (Specialist) [name], MD as Referring Physician (Internal Medicine) [name], MD as Consulting Physician (Neurosurgery)  CHIEF COMPLAINT:  Chief Complaint  Patient presents with  . New Patient (Initial Visit)    INTERVAL HISTORY: Patient is a 69 year old male who is scheduled for colonoscopy later this week was found to have a decreased white blood cell count on routine blood work. He currently feels well and is asymptomatic. He denies any recent fevers or illnesses. He is good appetite and denies weight loss. He denies any bony pain. He has no neurologic complaints. He denies any chest pain or shortness of breath. He denies any nausea, vomiting, constipation, or diarrhea. He has no urinary complaints. Patient feels at his baseline and offers no specific complaints today.  REVIEW OF SYSTEMS:   Review of Systems  Constitutional: Negative.  Negative for fever, weight loss and malaise/fatigue.  Respiratory: Negative.   Cardiovascular: Negative.   Gastrointestinal: Negative.   Neurological: Negative.  Negative for weakness.    As per HPI. Otherwise, a complete review of systems is negatve.  PAST MEDICAL HISTORY: Past Medical History  Diagnosis Date  . Hyperlipidemia   . Hypertension   . Chronic kidney disease   . Bradycardia   . CAD (coronary artery disease)   . Spinal stenosis     lumbar  . GERD (gastroesophageal reflux disease)   . Hypothyroidism   . Dysrhythmia     bradycardia - sees Dr. Humphrey Rolls  . BPH (benign prostatic hypertrophy)   . Sleep apnea     mild - SS - Care everywhere -04/18/07  . Abnormal white blood cell (WBC) count     seeing Dr. [name] at [place]  on 10/25    PAST SURGICAL HISTORY: Past Surgical History  Procedure Laterality Date  . Coronary angioplasty  10/05 and 12/05    4 DES placed  . Back surgery    . Hernia repair      x3  . Colonoscopy with propofol N/A 07/13/2015    Procedure: COLONOSCOPY WITH PROPOFOL;  Surgeon: Lucilla Lame, MD;  Location: [facility];  Service: Endoscopy;  Laterality: N/A;  . Polypectomy  07/13/2015    Procedure: POLYPECTOMY;  Surgeon: Lucilla Lame, MD;  Location: [facility];  Service: Endoscopy;;    FAMILY HISTORY Family History  Problem Relation Age of Onset  . Hypertension Mother   . Heart disease Father   . Hypertension Father   . Kidney disease Father   . Hypertension Brother   . Diabetes Son        ADVANCED DIRECTIVES:    HEALTH MAINTENANCE: Social History  Substance Use Topics  . Smoking status: Former Smoker    Types: Cigarettes    Quit date: 07/16/2004  . Smokeless tobacco: Former Systems developer    Types: Chew  . Alcohol Use: No     Colonoscopy:  PAP:  Bone density:  Lipid panel:  No Known Allergies  Current Outpatient Prescriptions  Medication Sig Dispense Refill  . aspirin 81 MG tablet Take 81 mg by mouth daily. AM    . clopidogrel (PLAVIX) 75 MG tablet Take 75 mg by mouth. AM    . isosorbide mononitrate (IMDUR) 30 MG 24 hr tablet  30 mg daily. AM  5  . levothyroxine (SYNTHROID, LEVOTHROID) 50 MCG tablet Take 1 tablet (50 mcg total) by mouth daily. 90 tablet 4  . lisinopril (PRINIVIL,ZESTRIL) 10 MG tablet Take 1 tablet (10 mg total) by mouth daily. 90 tablet 4  . multivitamin-iron-minerals-folic acid (CENTRUM) chewable tablet Chew 1 tablet by mouth daily.    . pantoprazole (PROTONIX) 20 MG tablet Take 1 tablet (20 mg total) by mouth daily. 90 tablet 4  . simvastatin (ZOCOR) 40 MG tablet Take 1 tablet (40 mg total) by mouth daily. 90 tablet 4   No current facility-administered medications for this visit.    OBJECTIVE: Filed Vitals:   07/12/15 1114  BP:  159/83  Pulse: 49  Temp: 96.5 F (35.8 C)     Body mass index is 31.83 kg/(m^2).    ECOG FS:0 - Asymptomatic  General: Well-developed, well-nourished, no acute distress. Eyes: Pink conjunctiva, anicteric sclera. HEENT: Normocephalic, moist mucous membranes, clear oropharnyx. Lungs: Clear to auscultation bilaterally. Heart: Regular rate and rhythm. No rubs, murmurs, or gallops. Abdomen: Soft, nontender, nondistended. No organomegaly noted, normoactive bowel sounds. Musculoskeletal: No edema, cyanosis, or clubbing. Neuro: Alert, answering all questions appropriately. Cranial nerves grossly intact. Skin: No rashes or petechiae noted. Psych: Normal affect. Lymphatics: No cervical, calvicular, axillary or inguinal LAD.   LAB RESULTS:  Lab Results  Component Value Date   NA 140 06/21/2015   K 4.2 06/21/2015   CL 101 06/21/2015   CO2 23 06/21/2015   GLUCOSE 93 06/21/2015   BUN 19 06/21/2015   CREATININE 1.55* 06/21/2015   CALCIUM 9.4 06/21/2015   PROT 6.9 06/21/2015   ALBUMIN 4.8 06/21/2015   AST 27 06/21/2015   ALT 29 06/21/2015   ALKPHOS 46 06/21/2015   BILITOT 0.6 06/21/2015   GFRNONAA 45* 06/21/2015   GFRAA 52* 06/21/2015    Lab Results  Component Value Date   WBC 2.9* 07/12/2015   NEUTROABS 1.2* 06/28/2015   HGB 14.5 07/12/2015   HCT 43.0 07/12/2015   MCV 100.2* 07/12/2015   PLT 119* 07/12/2015     STUDIES: No results found.  ASSESSMENT: Leukopenia, thrombocytopenia.  PLAN:    1. Leukopenia: Patient's white blood cell count is decreased, but essentially unchanged. He is also asymptomatic. SPEP and antineutrophil antibodies are negative. The remainder of his blood work is also negative or within normal limits. No intervention is needed at this time. Patient does not require bone marrow biopsy. Return to clinic in 3 months with repeat laboratory work and further evaluation. 2. Thrombocytopenia: Mild. No bone marrow biopsy as above. Platelet antibodies are  negative. Monitor. 3. Colonoscopy:  Okay to proceed from a hematology standpoint.  Patient expressed understanding and was in agreement with this plan. He also understands that He can call clinic at any time with any questions, concerns, or complaints.    [name], MD   07/21/2015 3:41 PM

## 2015-08-04 ENCOUNTER — Other Ambulatory Visit: Payer: Self-pay | Admitting: Family Medicine

## 2015-08-04 NOTE — Telephone Encounter (Signed)
LAST VISIT: 12/20/2014 NEXT APPT: 12/21/2015 (CPE) PRACTICE PARTNER: [id number]  Request for Levothyroxine 41mg tab

## 2015-09-29 ENCOUNTER — Telehealth: Payer: Self-pay | Admitting: Family Medicine

## 2015-09-29 NOTE — Telephone Encounter (Signed)
Pt came in to notify that due to insurance purposes all of his medications need to be transferred to the following:  [name] [address], [place] [address] Phone # [phone]   Thanks.

## 2015-09-29 NOTE — Telephone Encounter (Signed)
Pharmacy updated.

## 2015-10-12 ENCOUNTER — Inpatient Hospital Stay (HOSPITAL_BASED_OUTPATIENT_CLINIC_OR_DEPARTMENT_OTHER): Payer: Medicare Other | Admitting: Oncology

## 2015-10-12 ENCOUNTER — Inpatient Hospital Stay: Payer: Medicare Other | Attending: Oncology

## 2015-10-12 VITALS — BP 167/84 | HR 45 | Temp 97.1°F | Resp 18 | Wt 216.7 lb

## 2015-10-12 DIAGNOSIS — E039 Hypothyroidism, unspecified: Secondary | ICD-10-CM | POA: Diagnosis not present

## 2015-10-12 DIAGNOSIS — Z79899 Other long term (current) drug therapy: Secondary | ICD-10-CM | POA: Diagnosis not present

## 2015-10-12 DIAGNOSIS — N400 Enlarged prostate without lower urinary tract symptoms: Secondary | ICD-10-CM | POA: Diagnosis not present

## 2015-10-12 DIAGNOSIS — I2510 Atherosclerotic heart disease of native coronary artery without angina pectoris: Secondary | ICD-10-CM

## 2015-10-12 DIAGNOSIS — E875 Hyperkalemia: Secondary | ICD-10-CM | POA: Diagnosis not present

## 2015-10-12 DIAGNOSIS — D696 Thrombocytopenia, unspecified: Secondary | ICD-10-CM

## 2015-10-12 DIAGNOSIS — N189 Chronic kidney disease, unspecified: Secondary | ICD-10-CM | POA: Diagnosis not present

## 2015-10-12 DIAGNOSIS — D72819 Decreased white blood cell count, unspecified: Secondary | ICD-10-CM

## 2015-10-12 DIAGNOSIS — K219 Gastro-esophageal reflux disease without esophagitis: Secondary | ICD-10-CM | POA: Insufficient documentation

## 2015-10-12 DIAGNOSIS — G4730 Sleep apnea, unspecified: Secondary | ICD-10-CM

## 2015-10-12 DIAGNOSIS — E785 Hyperlipidemia, unspecified: Secondary | ICD-10-CM

## 2015-10-12 DIAGNOSIS — Z87891 Personal history of nicotine dependence: Secondary | ICD-10-CM | POA: Diagnosis not present

## 2015-10-12 DIAGNOSIS — I129 Hypertensive chronic kidney disease with stage 1 through stage 4 chronic kidney disease, or unspecified chronic kidney disease: Secondary | ICD-10-CM | POA: Insufficient documentation

## 2015-10-12 DIAGNOSIS — M4806 Spinal stenosis, lumbar region: Secondary | ICD-10-CM | POA: Diagnosis not present

## 2015-10-12 DIAGNOSIS — Z7982 Long term (current) use of aspirin: Secondary | ICD-10-CM | POA: Diagnosis not present

## 2015-10-12 DIAGNOSIS — R001 Bradycardia, unspecified: Secondary | ICD-10-CM | POA: Insufficient documentation

## 2015-10-12 LAB — CBC WITH DIFFERENTIAL/PLATELET
Basophils Absolute: 0 10*3/uL (ref 0–0.1)
Basophils Relative: 0 %
EOS ABS: 0.1 10*3/uL (ref 0–0.7)
EOS PCT: 2 %
HCT: 41.5 % (ref 40.0–52.0)
HEMOGLOBIN: 14.4 g/dL (ref 13.0–18.0)
LYMPHS ABS: 1 10*3/uL (ref 1.0–3.6)
Lymphocytes Relative: 36 %
MCH: 34.4 pg — AB (ref 26.0–34.0)
MCHC: 34.8 g/dL (ref 32.0–36.0)
MCV: 99 fL (ref 80.0–100.0)
MONO ABS: 0.4 10*3/uL (ref 0.2–1.0)
MONOS PCT: 13 %
NEUTROS PCT: 49 %
Neutro Abs: 1.4 10*3/uL (ref 1.4–6.5)
Platelets: 100 10*3/uL — ABNORMAL LOW (ref 150–440)
RBC: 4.19 MIL/uL — ABNORMAL LOW (ref 4.40–5.90)
RDW: 14.3 % (ref 11.5–14.5)
WBC: 2.9 10*3/uL — ABNORMAL LOW (ref 3.8–10.6)

## 2015-10-12 NOTE — Progress Notes (Signed)
[facility]  Telephone:(336) [phone] [phone]) [phone]  ID: [name] OB: [birth year]  MR#: [id number]  LEX#:[id number]  Patient Care Team: [name], MD as PCP - General (Family Medicine) [name], MD as PCP - Family Medicine (Family Medicine) [name], MD (Specialist) [name], MD as Referring Physician (Internal Medicine) [name], MD as Consulting Physician (Neurosurgery)  CHIEF COMPLAINT:  Chief Complaint  Patient presents with  . thrombocytopenia  . leukopenia    INTERVAL HISTORY: Patient returns to clinic today for further evaluation and repeat laboratory work. He continues to feel well and is asymptomatic. He denies any recent fevers or illnesses. He has a good appetite and denies weight loss. He denies any bony pain. He has no neurologic complaints. He denies any chest pain or shortness of breath. He denies any nausea, vomiting, constipation, or diarrhea. He has no urinary complaints. Patient feels at his baseline and offers no specific complaints today.  REVIEW OF SYSTEMS:   Review of Systems  Constitutional: Negative.  Negative for fever, weight loss and malaise/fatigue.  Respiratory: Negative.   Cardiovascular: Negative.   Gastrointestinal: Negative.   Neurological: Negative.  Negative for weakness.    As per HPI. Otherwise, a complete review of systems is negatve.  PAST MEDICAL HISTORY: Past Medical History  Diagnosis Date  . Hyperlipidemia   . Hypertension   . Chronic kidney disease   . Bradycardia   . CAD (coronary artery disease)   . Spinal stenosis     lumbar  . GERD (gastroesophageal reflux disease)   . Hypothyroidism   . Dysrhythmia     bradycardia - sees Dr. Humphrey Rolls  . BPH (benign prostatic hypertrophy)   . Sleep apnea     mild - SS - Care everywhere -04/18/07  . Abnormal white blood cell (WBC) count     seeing Dr. [name] at [place] on 10/25    PAST SURGICAL HISTORY: Past Surgical History    Procedure Laterality Date  . Coronary angioplasty  10/05 and 12/05    4 DES placed  . Back surgery    . Hernia repair      x3  . Colonoscopy with propofol N/A 07/13/2015    Procedure: COLONOSCOPY WITH PROPOFOL;  Surgeon: Lucilla Lame, MD;  Location: [facility];  Service: Endoscopy;  Laterality: N/A;  . Polypectomy  07/13/2015    Procedure: POLYPECTOMY;  Surgeon: Lucilla Lame, MD;  Location: [facility];  Service: Endoscopy;;    FAMILY HISTORY Family History  Problem Relation Age of Onset  . Hypertension Mother   . Heart disease Father   . Hypertension Father   . Kidney disease Father   . Hypertension Brother   . Diabetes Son        ADVANCED DIRECTIVES:    HEALTH MAINTENANCE: Social History  Substance Use Topics  . Smoking status: Former Smoker    Types: Cigarettes    Quit date: 07/16/2004  . Smokeless tobacco: Former Systems developer    Types: Chew  . Alcohol Use: No     Colonoscopy:  PAP:  Bone density:  Lipid panel:  No Known Allergies  Current Outpatient Prescriptions  Medication Sig Dispense Refill  . aspirin 81 MG tablet Take 81 mg by mouth daily. AM    . clopidogrel (PLAVIX) 75 MG tablet Take 75 mg by mouth. AM    . isosorbide mononitrate (IMDUR) 30 MG 24 hr tablet 30 mg daily. AM  5  . levothyroxine (SYNTHROID, LEVOTHROID) 50  MCG tablet TAKE 1 TABLET BY MOUTH EVERY DAY 90 tablet 3  . lisinopril (PRINIVIL,ZESTRIL) 10 MG tablet Take 1 tablet (10 mg total) by mouth daily. 90 tablet 4  . multivitamin-iron-minerals-folic acid (CENTRUM) chewable tablet Chew 1 tablet by mouth daily.    . pantoprazole (PROTONIX) 20 MG tablet Take 1 tablet (20 mg total) by mouth daily. 90 tablet 4  . simvastatin (ZOCOR) 40 MG tablet Take 1 tablet (40 mg total) by mouth daily. 90 tablet 4   No current facility-administered medications for this visit.    OBJECTIVE: Filed Vitals:   10/12/15 1050  BP: 167/84  Pulse: 45  Temp: 97.1 F (36.2 C)  Resp: 18     Body mass  index is 31.99 kg/(m^2).    ECOG FS:0 - Asymptomatic  General: Well-developed, well-nourished, no acute distress. Eyes: Pink conjunctiva, anicteric sclera. Lungs: Clear to auscultation bilaterally. Heart: Regular rate and rhythm. No rubs, murmurs, or gallops. Abdomen: Soft, nontender, nondistended. No organomegaly noted, normoactive bowel sounds. Musculoskeletal: No edema, cyanosis, or clubbing. Neuro: Alert, answering all questions appropriately. Cranial nerves grossly intact. Skin: No rashes or petechiae noted. Psych: Normal affect.   LAB RESULTS:  Lab Results  Component Value Date   NA 140 06/21/2015   K 4.2 06/21/2015   CL 101 06/21/2015   CO2 23 06/21/2015   GLUCOSE 93 06/21/2015   BUN 19 06/21/2015   CREATININE 1.55* 06/21/2015   CALCIUM 9.4 06/21/2015   PROT 6.9 06/21/2015   ALBUMIN 4.8 06/21/2015   AST 27 06/21/2015   ALT 29 06/21/2015   ALKPHOS 46 06/21/2015   BILITOT 0.6 06/21/2015   GFRNONAA 45* 06/21/2015   GFRAA 52* 06/21/2015    Lab Results  Component Value Date   WBC 2.9* 10/12/2015   NEUTROABS 1.4 10/12/2015   HGB 14.4 10/12/2015   HCT 41.5 10/12/2015   MCV 99.0 10/12/2015   PLT 100* 10/12/2015     STUDIES: No results found.  ASSESSMENT: Leukopenia, thrombocytopenia.  PLAN:    1. Leukopenia: Patient's white blood cell count is decreased, but essentially unchanged. He is also asymptomatic. SPEP and antineutrophil antibodies are negative. The remainder of his blood work is also negative or within normal limits. No intervention is needed at this time. Patient may have a component of MDS, but this would require bone marrow biopsy to determine. This is not necessary at this time and patient does not wish to undergo this procedure. Would consider one in the future if he became symptomatic or his cytopenias continued to decline. Return to clinic in 6 months with repeat laboratory work and further evaluation. 2. Thrombocytopenia: Mild. No bone marrow  biopsy as above. Platelet antibodies are negative. Monitor. 3. Hypertension: Patient's blood pressure is elevated today, continue treatment per primary care.   Patient expressed understanding and was in agreement with this plan. He also understands that He can call clinic at any time with any questions, concerns, or complaints.    Mayra Reel, NP   10/12/2015 1:44 PM   Patient was seen and evaluated independently and I agree with the assessment and plan as dictated above.  [name], MD 10/14/2015 2:21 PM

## 2015-11-02 ENCOUNTER — Encounter: Payer: Self-pay | Admitting: Family Medicine

## 2015-12-21 ENCOUNTER — Ambulatory Visit: Payer: Medicaid Other | Admitting: Family Medicine

## 2015-12-25 ENCOUNTER — Ambulatory Visit (INDEPENDENT_AMBULATORY_CARE_PROVIDER_SITE_OTHER): Payer: Medicare Other | Admitting: Family Medicine

## 2015-12-25 ENCOUNTER — Encounter: Payer: Self-pay | Admitting: Family Medicine

## 2015-12-25 VITALS — BP 117/63 | HR 57 | Temp 98.4°F | Ht 69.0 in | Wt 214.0 lb

## 2015-12-25 DIAGNOSIS — E785 Hyperlipidemia, unspecified: Secondary | ICD-10-CM

## 2015-12-25 DIAGNOSIS — I2510 Atherosclerotic heart disease of native coronary artery without angina pectoris: Secondary | ICD-10-CM | POA: Diagnosis not present

## 2015-12-25 DIAGNOSIS — I10 Essential (primary) hypertension: Secondary | ICD-10-CM | POA: Diagnosis not present

## 2015-12-25 DIAGNOSIS — I2583 Coronary atherosclerosis due to lipid rich plaque: Secondary | ICD-10-CM

## 2015-12-25 LAB — LP+ALT+AST PICCOLO, WAIVED
ALT (SGPT) PICCOLO, WAIVED: 26 U/L (ref 10–47)
AST (SGOT) PICCOLO, WAIVED: 26 U/L (ref 11–38)
Chol/HDL Ratio Piccolo,Waive: 4.7 mg/dL
Cholesterol Piccolo, Waived: 121 mg/dL (ref <200)
HDL CHOL PICCOLO, WAIVED: 26 mg/dL — AB (ref >59)
LDL Chol Calc Piccolo Waived: 33 mg/dL (ref <100)
Triglycerides Piccolo,Waived: 313 mg/dL — ABNORMAL HIGH (ref <150)
VLDL CHOL CALC PICCOLO,WAIVE: 63 mg/dL — AB (ref <30)

## 2015-12-25 NOTE — Progress Notes (Signed)
BP 117/63 mmHg  Pulse 57  Temp(Src) 98.4 F (36.9 C)  Ht '5\' 9"'$  (1.753 m)  Wt 214 lb (97.07 kg)  BMI 31.59 kg/m2  SpO2 96%   Subjective:    Patient ID: Corey Perry, male    DOB: [birth year], 70 y.o.   MRN: [id number]  HPI: Corey Perry is a 70 y.o. male  Chief Complaint  Patient presents with  . Hyperlipidemia  . Hypertension   Agent doing well all in all no complaints from medication takes simvastatin and lisinopril thyroid medications without problems also Protonix for reflux without problems or side effects Blood pressure good control reviewed notes from Dr. [name] and blood counts stay and stable.  Relevant past medical, surgical, family and social history reviewed and updated as indicated. Interim medical history since our last visit reviewed. Allergies and medications reviewed and updated.  Review of Systems  Constitutional: Negative.   Respiratory: Negative.   Cardiovascular: Negative.     Per HPI unless specifically indicated above     Objective:    BP 117/63 mmHg  Pulse 57  Temp(Src) 98.4 F (36.9 C)  Ht '5\' 9"'$  (1.753 m)  Wt 214 lb (97.07 kg)  BMI 31.59 kg/m2  SpO2 96%  Wt Readings from Last 3 Encounters:  12/25/15 214 lb (97.07 kg)  10/12/15 216 lb 11.4 oz (98.3 kg)  07/13/15 213 lb (96.616 kg)    Physical Exam  Constitutional: He is oriented to person, place, and time. He appears well-developed and well-nourished. No distress.  HENT:  Head: Normocephalic and atraumatic.  Right Ear: Hearing normal.  Left Ear: Hearing normal.  Nose: Nose normal.  Eyes: Conjunctivae and lids are normal. Right eye exhibits no discharge. Left eye exhibits no discharge. No scleral icterus.  Cardiovascular: Normal rate, regular rhythm and normal heart sounds.   Pulmonary/Chest: Effort normal and breath sounds normal. No respiratory distress.  Musculoskeletal: Normal range of motion.  Neurological: He is alert and oriented to person, place, and time.   Skin: Skin is intact. No rash noted.  Psychiatric: He has a normal mood and affect. His speech is normal and behavior is normal. Judgment and thought content normal. Cognition and memory are normal.    Results for orders placed or performed in visit on 10/12/15  CBC with Differential/Platelet  Result Value Ref Range   WBC 2.9 (L) 3.8 - 10.6 K/uL   RBC 4.19 (L) 4.40 - 5.90 MIL/uL   Hemoglobin 14.4 13.0 - 18.0 g/dL   HCT 41.5 40.0 - 52.0 %   MCV 99.0 80.0 - 100.0 fL   MCH 34.4 (H) 26.0 - 34.0 pg   MCHC 34.8 32.0 - 36.0 g/dL   RDW 14.3 11.5 - 14.5 %   Platelets 100 (L) 150 - 440 K/uL   Neutrophils Relative % 49 %   Neutro Abs 1.4 1.4 - 6.5 K/uL   Lymphocytes Relative 36 %   Lymphs Abs 1.0 1.0 - 3.6 K/uL   Monocytes Relative 13 %   Monocytes Absolute 0.4 0.2 - 1.0 K/uL   Eosinophils Relative 2 %   Eosinophils Absolute 0.1 0 - 0.7 K/uL   Basophils Relative 0 %   Basophils Absolute 0.0 0 - 0.1 K/uL      Assessment & Plan:   Problem List Items Addressed This Visit      Cardiovascular and Mediastinum   Hypertension    The current medical regimen is effective;  continue present plan and medications.  Relevant Orders   Basic metabolic panel   LP+ALT+AST Piccolo, Waived   CAD (coronary artery disease)    Followed by cardiology        Other   Hyperlipidemia - Primary    The current medical regimen is effective;  continue present plan and medications.       Relevant Orders   Basic metabolic panel   LP+ALT+AST Piccolo, Waived       Follow up plan: Return in about 6 months (around 06/25/2016) for Physical Exam.

## 2015-12-25 NOTE — Assessment & Plan Note (Signed)
Followed by cardiology 

## 2015-12-25 NOTE — Assessment & Plan Note (Signed)
The current medical regimen is effective;  continue present plan and medications.  

## 2015-12-26 ENCOUNTER — Encounter: Payer: Self-pay | Admitting: Family Medicine

## 2015-12-26 LAB — BASIC METABOLIC PANEL
BUN/Creatinine Ratio: 17 (ref 10–24)
BUN: 25 mg/dL (ref 8–27)
CALCIUM: 9.1 mg/dL (ref 8.6–10.2)
CHLORIDE: 104 mmol/L (ref 96–106)
CO2: 20 mmol/L (ref 18–29)
Creatinine, Ser: 1.48 mg/dL — ABNORMAL HIGH (ref 0.76–1.27)
GFR calc non Af Amer: 48 mL/min/{1.73_m2} — ABNORMAL LOW (ref >59)
GFR, EST AFRICAN AMERICAN: 55 mL/min/{1.73_m2} — AB (ref >59)
GLUCOSE: 87 mg/dL (ref 65–99)
POTASSIUM: 4.6 mmol/L (ref 3.5–5.2)
Sodium: 140 mmol/L (ref 134–144)

## 2016-03-11 ENCOUNTER — Other Ambulatory Visit: Payer: Self-pay | Admitting: Cardiovascular Disease

## 2016-03-12 ENCOUNTER — Ambulatory Visit
Admission: RE | Admit: 2016-03-12 | Discharge: 2016-03-13 | Disposition: A | Discharge status: Home / Self Care | Payer: Medicare Other | Source: Ambulatory Visit | Attending: Cardiovascular Disease | Admitting: Cardiovascular Disease

## 2016-03-12 ENCOUNTER — Ambulatory Visit: Admit: 2016-03-12 | Payer: Self-pay | Admitting: Internal Medicine

## 2016-03-12 ENCOUNTER — Encounter
Admission: RE | Disposition: A | Discharge status: Home / Self Care | Payer: Self-pay | Source: Ambulatory Visit | Attending: Cardiovascular Disease

## 2016-03-12 ENCOUNTER — Other Ambulatory Visit
Admission: RE | Admit: 2016-03-12 | Discharge: 2016-03-12 | Disposition: A | Discharge status: Home / Self Care | Payer: Medicare Other | Source: Ambulatory Visit | Attending: Cardiovascular Disease | Admitting: Cardiovascular Disease

## 2016-03-12 ENCOUNTER — Encounter: Payer: Self-pay | Admitting: *Deleted

## 2016-03-12 DIAGNOSIS — I2510 Atherosclerotic heart disease of native coronary artery without angina pectoris: Secondary | ICD-10-CM | POA: Diagnosis present

## 2016-03-12 DIAGNOSIS — I351 Nonrheumatic aortic (valve) insufficiency: Secondary | ICD-10-CM | POA: Diagnosis not present

## 2016-03-12 DIAGNOSIS — E785 Hyperlipidemia, unspecified: Secondary | ICD-10-CM | POA: Diagnosis not present

## 2016-03-12 DIAGNOSIS — I340 Nonrheumatic mitral (valve) insufficiency: Secondary | ICD-10-CM | POA: Diagnosis not present

## 2016-03-12 DIAGNOSIS — I10 Essential (primary) hypertension: Secondary | ICD-10-CM | POA: Diagnosis not present

## 2016-03-12 DIAGNOSIS — R0602 Shortness of breath: Secondary | ICD-10-CM | POA: Insufficient documentation

## 2016-03-12 DIAGNOSIS — I25119 Atherosclerotic heart disease of native coronary artery with unspecified angina pectoris: Secondary | ICD-10-CM | POA: Diagnosis not present

## 2016-03-12 DIAGNOSIS — Z79899 Other long term (current) drug therapy: Secondary | ICD-10-CM | POA: Insufficient documentation

## 2016-03-12 DIAGNOSIS — Z955 Presence of coronary angioplasty implant and graft: Secondary | ICD-10-CM | POA: Insufficient documentation

## 2016-03-12 DIAGNOSIS — Z7982 Long term (current) use of aspirin: Secondary | ICD-10-CM | POA: Diagnosis not present

## 2016-03-12 DIAGNOSIS — Z8249 Family history of ischemic heart disease and other diseases of the circulatory system: Secondary | ICD-10-CM | POA: Diagnosis not present

## 2016-03-12 DIAGNOSIS — Z87891 Personal history of nicotine dependence: Secondary | ICD-10-CM | POA: Insufficient documentation

## 2016-03-12 DIAGNOSIS — Z7902 Long term (current) use of antithrombotics/antiplatelets: Secondary | ICD-10-CM | POA: Diagnosis not present

## 2016-03-12 DIAGNOSIS — I200 Unstable angina: Secondary | ICD-10-CM | POA: Diagnosis present

## 2016-03-12 HISTORY — PX: CARDIAC CATHETERIZATION: SHX172

## 2016-03-12 LAB — BASIC METABOLIC PANEL
ANION GAP: 8 (ref 5–15)
BUN: 20 mg/dL (ref 6–20)
CO2: 24 mmol/L (ref 22–32)
Calcium: 8.9 mg/dL (ref 8.9–10.3)
Chloride: 105 mmol/L (ref 101–111)
Creatinine, Ser: 1.37 mg/dL — ABNORMAL HIGH (ref 0.61–1.24)
GFR, EST AFRICAN AMERICAN: 59 mL/min — AB (ref >60)
GFR, EST NON AFRICAN AMERICAN: 51 mL/min — AB (ref >60)
GLUCOSE: 114 mg/dL — AB (ref 65–99)
POTASSIUM: 4.4 mmol/L (ref 3.5–5.1)
Sodium: 137 mmol/L (ref 135–145)

## 2016-03-12 LAB — CBC
HEMATOCRIT: 39.3 % — AB (ref 40.0–52.0)
Hemoglobin: 13.6 g/dL (ref 13.0–18.0)
MCH: 34.2 pg — ABNORMAL HIGH (ref 26.0–34.0)
MCHC: 34.5 g/dL (ref 32.0–36.0)
MCV: 98.9 fL (ref 80.0–100.0)
PLATELETS: 101 10*3/uL — AB (ref 150–440)
RBC: 3.97 MIL/uL — AB (ref 4.40–5.90)
RDW: 13.9 % (ref 11.5–14.5)
WBC: 3.1 10*3/uL — AB (ref 3.8–10.6)

## 2016-03-12 SURGERY — CORONARY STENT INTERVENTION
Anesthesia: Moderate Sedation

## 2016-03-12 SURGERY — LEFT HEART CATH AND CORONARY ANGIOGRAPHY
Anesthesia: Moderate Sedation | Laterality: Left

## 2016-03-12 MED ORDER — SODIUM CHLORIDE 0.9 % IV SOLN
0.2500 mg/kg/h | INTRAVENOUS | Status: AC
  Filled 2016-03-12: qty 250

## 2016-03-12 MED ORDER — SODIUM CHLORIDE 0.9 % IV SOLN
250.0000 mg | INTRAVENOUS | Status: DC | PRN
  Administered 2016-03-12: 1.75 mg/kg/h via INTRAVENOUS

## 2016-03-12 MED ORDER — SODIUM CHLORIDE 0.9 % IV SOLN
250.0000 mL | INTRAVENOUS | Status: DC | PRN
  Administered 2016-03-12: 1000 mL via INTRAVENOUS

## 2016-03-12 MED ORDER — SODIUM CHLORIDE 0.9 % IV SOLN: 250.0000 mL | INTRAVENOUS | Status: DC | PRN

## 2016-03-12 MED ORDER — FENTANYL CITRATE (PF) 100 MCG/2ML IJ SOLN
INTRAMUSCULAR | Status: AC
  Filled 2016-03-12: qty 2

## 2016-03-12 MED ORDER — SODIUM CHLORIDE 0.9% FLUSH: 3.0000 mL | Freq: Two times a day (BID) | INTRAVENOUS | Status: DC

## 2016-03-12 MED ORDER — MIDAZOLAM HCL 2 MG/2ML IJ SOLN
INTRAMUSCULAR | Status: DC | PRN
  Administered 2016-03-12 (×2): 1 mg

## 2016-03-12 MED ORDER — SODIUM CHLORIDE 0.9 % WEIGHT BASED INFUSION
3.0000 mL/kg/h | INTRAVENOUS | Status: DC
Start: 2016-03-13 — End: 2016-03-12

## 2016-03-12 MED ORDER — TICAGRELOR 90 MG PO TABS
ORAL_TABLET | ORAL | Status: AC
  Filled 2016-03-12: qty 2

## 2016-03-12 MED ORDER — HEPARIN (PORCINE) IN NACL 2-0.9 UNIT/ML-% IJ SOLN
INTRAMUSCULAR | Status: AC
  Filled 2016-03-12: qty 500

## 2016-03-12 MED ORDER — SODIUM CHLORIDE 0.9 % WEIGHT BASED INFUSION: 3.0000 mL/kg/h | INTRAVENOUS | Status: DC

## 2016-03-12 MED ORDER — TICAGRELOR 90 MG PO TABS
180.0000 mg | ORAL_TABLET | Freq: Once | ORAL | Status: AC
  Administered 2016-03-12: 180 mg via ORAL

## 2016-03-12 MED ORDER — MIDAZOLAM HCL 2 MG/2ML IJ SOLN
INTRAMUSCULAR | Status: DC | PRN
  Administered 2016-03-12: 1 mg via INTRAVENOUS
  Administered 2016-03-12: 0.5 mg via INTRAVENOUS

## 2016-03-12 MED ORDER — SODIUM CHLORIDE 0.9 % WEIGHT BASED INFUSION: 1.0000 mL/kg/h | INTRAVENOUS | Status: DC

## 2016-03-12 MED ORDER — ASPIRIN 81 MG PO CHEW: 81.0000 mg | CHEWABLE_TABLET | ORAL | Status: DC

## 2016-03-12 MED ORDER — TICAGRELOR 90 MG PO TABS
90.0000 mg | ORAL_TABLET | Freq: Two times a day (BID) | ORAL | Status: DC
  Administered 2016-03-13 (×2): 90 mg via ORAL
  Filled 2016-03-12 (×2): qty 1

## 2016-03-12 MED ORDER — IOPAMIDOL (ISOVUE-300) INJECTION 61%
INTRAVENOUS | Status: DC | PRN
  Administered 2016-03-12: 180 mL via INTRA_ARTERIAL

## 2016-03-12 MED ORDER — ASPIRIN 81 MG PO CHEW
81.0000 mg | CHEWABLE_TABLET | Freq: Every day | ORAL | Status: DC
  Administered 2016-03-13: 81 mg via ORAL
  Filled 2016-03-12: qty 1

## 2016-03-12 MED ORDER — MIDAZOLAM HCL 2 MG/2ML IJ SOLN
INTRAMUSCULAR | Status: AC
  Filled 2016-03-12: qty 2

## 2016-03-12 MED ORDER — ONDANSETRON HCL 4 MG/2ML IJ SOLN: 4.0000 mg | Freq: Four times a day (QID) | INTRAMUSCULAR | Status: DC | PRN

## 2016-03-12 MED ORDER — SODIUM CHLORIDE 0.9 % WEIGHT BASED INFUSION: 3.0000 mL/kg/h | INTRAVENOUS | Status: AC

## 2016-03-12 MED ORDER — LABETALOL HCL 5 MG/ML IV SOLN
INTRAVENOUS | Status: DC | PRN
  Administered 2016-03-12 (×2): 10 mg via INTRAVENOUS

## 2016-03-12 MED ORDER — FENTANYL CITRATE (PF) 100 MCG/2ML IJ SOLN
INTRAMUSCULAR | Status: DC | PRN
  Administered 2016-03-12: 25 ug via INTRAVENOUS
  Administered 2016-03-12: 50 ug via INTRAVENOUS
  Administered 2016-03-12: 25 ug via INTRAVENOUS

## 2016-03-12 MED ORDER — SODIUM CHLORIDE 0.9% FLUSH: 3.0000 mL | INTRAVENOUS | Status: DC | PRN

## 2016-03-12 MED ORDER — MORPHINE SULFATE (PF) 2 MG/ML IV SOLN
INTRAVENOUS | Status: AC
  Administered 2016-03-12: 2 mg via INTRAVENOUS
  Filled 2016-03-12: qty 1

## 2016-03-12 MED ORDER — SODIUM CHLORIDE 0.9% FLUSH
3.0000 mL | Freq: Two times a day (BID) | INTRAVENOUS | Status: DC
  Administered 2016-03-13: 3 mL via INTRAVENOUS

## 2016-03-12 MED ORDER — ACETAMINOPHEN 325 MG PO TABS
650.0000 mg | ORAL_TABLET | ORAL | Status: DC | PRN
  Administered 2016-03-13: 650 mg via ORAL
  Filled 2016-03-12: qty 2

## 2016-03-12 MED ORDER — FENTANYL CITRATE (PF) 100 MCG/2ML IJ SOLN
25.0000 ug | Freq: Once | INTRAMUSCULAR | Status: AC
  Administered 2016-03-12: 14:00:00 via INTRAVENOUS

## 2016-03-12 MED ORDER — BIVALIRUDIN BOLUS VIA INFUSION - CUPID
INTRAVENOUS | Status: DC | PRN
  Administered 2016-03-12: 72.15 mg via INTRAVENOUS

## 2016-03-12 MED ORDER — LABETALOL HCL 5 MG/ML IV SOLN
INTRAVENOUS | Status: AC
  Filled 2016-03-12: qty 4

## 2016-03-12 MED ORDER — BIVALIRUDIN 250 MG IV SOLR
INTRAVENOUS | Status: AC
  Filled 2016-03-12: qty 250

## 2016-03-12 MED ORDER — NITROGLYCERIN 5 MG/ML IV SOLN
INTRAVENOUS | Status: AC
  Filled 2016-03-12: qty 10

## 2016-03-12 MED ORDER — FENTANYL CITRATE (PF) 100 MCG/2ML IJ SOLN
INTRAMUSCULAR | Status: DC | PRN
  Administered 2016-03-12: 25 ug via INTRAVENOUS

## 2016-03-12 SURGICAL SUPPLY — 11 items
BALLN TREK RX 3.0X12 (BALLOONS) ×3
BALLOON TREK RX 3.0X12 (BALLOONS) IMPLANT
CATH VISTA GUIDE 6FR XB3.5 (CATHETERS) ×2 IMPLANT
DEVICE CLOSURE MYNXGRIP 6/7F (Vascular Products) ×3 IMPLANT
DEVICE INFLAT 30 PLUS (MISCELLANEOUS) ×2 IMPLANT
KIT MANI 3VAL PERCEP (MISCELLANEOUS) ×3 IMPLANT
PACK CARDIAC CATH (CUSTOM PROCEDURE TRAY) ×3 IMPLANT
SHEATH AVANTI 6FR X 11CM (SHEATH) ×2 IMPLANT
STENT XIENCE ALPINE RX 3.25X15 (Permanent Stent) ×2 IMPLANT
WIRE EMERALD 3MM-J .035X150CM (WIRE) ×3 IMPLANT
WIRE INTUITION PROPEL ST 180CM (WIRE) ×3 IMPLANT

## 2016-03-12 SURGICAL SUPPLY — 14 items
CATH AMP RT 5F (CATHETERS) ×2 IMPLANT
CATH INFINITI 5 FR 3DRC (CATHETERS) ×2 IMPLANT
CATH INFINITI 5 FR JR3.5 (CATHETERS) ×2 IMPLANT
CATH INFINITI 5 FR MPA2 (CATHETERS) ×2 IMPLANT
CATH INFINITI 5FR ANG PIGTAIL (CATHETERS) ×2 IMPLANT
CATH INFINITI 5FR JL4 (CATHETERS) ×2 IMPLANT
CATH INFINITI JR4 5F (CATHETERS) ×2 IMPLANT
KIT MANI 3VAL PERCEP (MISCELLANEOUS) ×3 IMPLANT
KIT TRANSPAC II SGL 4260605 (MISCELLANEOUS) ×2 IMPLANT
NEEDLE PERC 18GX7CM (NEEDLE) ×3 IMPLANT
PACK CARDIAC CATH (CUSTOM PROCEDURE TRAY) ×3 IMPLANT
SHEATH PINNACLE 5F 10CM (SHEATH) ×2 IMPLANT
SUT SILK 0 FSL (SUTURE) ×2 IMPLANT
WIRE EMERALD 3MM-J .035X150CM (WIRE) ×2 IMPLANT

## 2016-03-12 NOTE — Progress Notes (Signed)
Patient admitted to unit. Oriented to room, call bell, and staff. Bed in lowest position.  Full assessment to Epic. Skin assessment verified with Lavella Lemons RN. Telemetry box verification with tele clerk- Box#: [id number]. Will continue to monitor.

## 2016-03-12 NOTE — Progress Notes (Signed)
Report to [facility] tele[facility].  Check right groin for bleeding or hematoma.  Patient will be on bedrest for 2 hours post sheath pull---out of bed at 18:30.  Bilateral pulses are 2's DP's.Marland Kitchen

## 2016-03-13 ENCOUNTER — Encounter: Payer: Self-pay | Admitting: Internal Medicine

## 2016-03-13 DIAGNOSIS — I25119 Atherosclerotic heart disease of native coronary artery with unspecified angina pectoris: Secondary | ICD-10-CM | POA: Diagnosis not present

## 2016-03-13 MED ORDER — ASPIRIN EC 81 MG PO TBEC
81.0000 mg | DELAYED_RELEASE_TABLET | Freq: Every day | ORAL | Status: DC
  Administered 2016-03-13: 81 mg via ORAL
  Filled 2016-03-13: qty 1

## 2016-03-13 MED ORDER — SIMVASTATIN 40 MG PO TABS
40.0000 mg | ORAL_TABLET | Freq: Every day | ORAL | Status: DC
  Administered 2016-03-13: 40 mg via ORAL
  Filled 2016-03-13: qty 1

## 2016-03-13 MED ORDER — PANTOPRAZOLE SODIUM 40 MG PO TBEC
40.0000 mg | DELAYED_RELEASE_TABLET | Freq: Every day | ORAL | Status: DC
  Administered 2016-03-13: 40 mg via ORAL
  Filled 2016-03-13: qty 1

## 2016-03-13 MED ORDER — LISINOPRIL 10 MG PO TABS
10.0000 mg | ORAL_TABLET | Freq: Every day | ORAL | Status: DC
  Administered 2016-03-13: 10 mg via ORAL
  Filled 2016-03-13: qty 1

## 2016-03-13 MED ORDER — TICAGRELOR 90 MG PO TABS: 90.0000 mg | ORAL_TABLET | Freq: Two times a day (BID) | ORAL | Status: DC

## 2016-03-13 MED ORDER — ISOSORBIDE MONONITRATE ER 30 MG PO TB24
30.0000 mg | ORAL_TABLET | Freq: Every day | ORAL | Status: DC
  Administered 2016-03-13: 30 mg via ORAL
  Filled 2016-03-13: qty 1

## 2016-03-13 MED ORDER — LEVOTHYROXINE SODIUM 50 MCG PO TABS: 50.0000 ug | ORAL_TABLET | Freq: Every day | ORAL | Status: DC

## 2016-03-13 NOTE — Care Management (Signed)
Met with patient at bedside. Cardiology has prescribed Brilinta. Coupon given. Patient lives at home with his wife. He is usually independent, active and requires no assistance with adls. He is retired. PCP: Golden Pop. No further needs anticipated.

## 2016-03-13 NOTE — Progress Notes (Signed)
Physician Discharge Summary  Patient ID: Corey Perry MRN: [id number] DOB/AGE: 05/16/1946 70 y.o.  Admit date: 03/12/2016 Discharge date: 03/13/2016  Primary Discharge Diagnosis Angina    Cardiac studies and intervention: Left Heart Cath:    Ramus-1 lesion, 80% stenosed.  Ramus-2 lesion, 80% stenosed.  Mid LAD to Dist LAD lesion, 30% stenosed. The lesion was previously treated with a drug-eluting stent.  Dist LAD lesion, 90% stenosed. Ost RCA lesion, 30% stenosed   Mid LAD lesion, 95% stenosed. Post intervention, there is a 0% residual stenosis. The lesion was not previously treated.  Successful PCI and stent with DES to mid LAD 3.25 x 15 mm xience 13 atm   Hospital Course: Corey Perry was admitted for overnight observation after stenting of the Mid LAD. He experienced some post procedure nausea last evening but is feeling well this morning and was able to eat breakfast. The patient is improved with easier breathing and no chest pressure. The groin is stable, tender but without hematoma, edema, drainage or bruit.   Discharge Exam: Blood pressure 140/55, pulse 64, temperature 98.9 F (37.2 C), temperature source Oral, resp. rate 18, height _0  (1.753 m), weight 211 lb 14.4 oz (96.117 kg), SpO2 95 %.    General appearance: alert, cooperative and appears stated age Head: Normocephalic, without obvious abnormality, atraumatic Resp: clear to auscultation bilaterally  Cardiac: RRR, No murmurs, rubs or gallops Muscuoloskeletal: Moves all extremities Extremities: No clubbing, cyanosis or edema.  Pedal pulses present and strong bilaterally. Right groin is tender, no hematoma, no edema, no drainage, without bruit.   Labs:   Lab Results  Component Value Date   WBC 3.1* 03/12/2016   HGB 13.6 03/12/2016   HCT 39.3* 03/12/2016   MCV 98.9 03/12/2016   PLT 101* 03/12/2016    Recent Labs Lab 03/12/16 0912  NA 137  K 4.4  CL 105  CO2 24  BUN 20  CREATININE 1.37*   CALCIUM 8.9  GLUCOSE 114*    Telemetry:  Sinus bradycardia, 56 bpm  FOLLOW UP PLANS AND APPOINTMENTS    Medication List    ASK your doctor about these medications        aspirin 81 MG tablet  Take 81 mg by mouth daily. AM     GARLIC HIGH POTENCY PO  Take by mouth daily.     isosorbide mononitrate 30 MG 24 hr tablet  Commonly known as:  IMDUR  30 mg daily. AM     levothyroxine 50 MCG tablet  Commonly known as:  SYNTHROID, LEVOTHROID  TAKE 1 TABLET BY MOUTH EVERY DAY     lisinopril 10 MG tablet  Commonly known as:  PRINIVIL,ZESTRIL  Take 1 tablet (10 mg total) by mouth daily.     multivitamin-iron-minerals-folic acid chewable tablet  Chew 1 tablet by mouth daily.     pantoprazole 20 MG tablet  Commonly known as:  PROTONIX  Take 1 tablet (20 mg total) by mouth daily.     PLAVIX 75 MG tablet  Generic drug:  clopidogrel  Take 75 mg by mouth daily. AM     simvastatin 40 MG tablet  Commonly known as:  ZOCOR  Take 1 tablet (40 mg total) by mouth daily.     vitamin C 1000 MG tablet  Take 1,000 mg by mouth daily.     vitamin E 400 UNIT capsule  Generic drug:  vitamin E  Take 400 Units by mouth daily.       STOP TAKING PLAVIX  AND TAKE BRILINTA 90 MG 1 TABLET BY MOUTH TWO TIMES DAILY- Handwritten prescription given.  BRING ALL MEDICATIONS WITH YOU TO FOLLOW UP APPOINTMENTS  FOLLOW UP AT [facility] ON Friday 03/15/2016 AT 11:00  Time spent with patient to include physician time: 20 minutes Signed:  Daune Perch, NP 03/13/2016, 9:27 AM

## 2016-03-13 NOTE — Progress Notes (Signed)
  SUBJECTIVE: Mr. Townley is status post cardiac stent to LAD. He is feeling very well this morning. He had some nausea last evening but this is improved and he was able to eat breakfast this morning. His right groin is mildly tender. His breathing has improved and he has no chest pain or pressure.   Filed Vitals:   03/13/16 0030 03/13/16 0216 03/13/16 0502 03/13/16 0917  BP:   121/50 140/55  Pulse:   49 64  Temp: 102.2 F (39 C) 99.5 F (37.5 C) 99.1 F (37.3 C) 98.9 F (37.2 C)  TempSrc:   Oral Oral  Resp:   16 18  Height:      Weight:      SpO2:   98% 95%    Intake/Output Summary (Last 24 hours) at 03/13/16 0948 Last data filed at 03/13/16 0948  Gross per 24 hour  Intake    240 ml  Output    850 ml  Net   -610 ml    LABS: Basic Metabolic Panel:  Recent Labs  03/12/16 0912  NA 137  K 4.4  CL 105  CO2 24  GLUCOSE 114*  BUN 20  CREATININE 1.37*  CALCIUM 8.9   Liver Function Tests: No results for input(s): AST, ALT, ALKPHOS, BILITOT, PROT, ALBUMIN in the last 72 hours. No results for input(s): LIPASE, AMYLASE in the last 72 hours. CBC:  Recent Labs  03/12/16 0912  WBC 3.1*  HGB 13.6  HCT 39.3*  MCV 98.9  PLT 101*   Cardiac Enzymes: No results for input(s): CKTOTAL, CKMB, CKMBINDEX, TROPONINI in the last 72 hours. BNP: Invalid input(s): POCBNP D-Dimer: No results for input(s): DDIMER in the last 72 hours. Hemoglobin A1C: No results for input(s): HGBA1C in the last 72 hours. Fasting Lipid Panel: No results for input(s): CHOL, HDL, LDLCALC, TRIG, CHOLHDL, LDLDIRECT in the last 72 hours. Thyroid Function Tests: No results for input(s): TSH, T4TOTAL, T3FREE, THYROIDAB in the last 72 hours.  Invalid input(s): FREET3 Anemia Panel: No results for input(s): VITAMINB12, FOLATE, FERRITIN, TIBC, IRON, RETICCTPCT in the last 72 hours.   PHYSICAL EXAM General: Well developed, well nourished, in no acute distress HEENT:  Normocephalic and  atramatic Neck:  No JVD.  Lungs: Clear bilaterally to auscultation and percussion. Heart: HRRR . Normal S1 and S2 without gallops or murmurs.  Abdomen: Bowel sounds are positive, abdomen soft and non-tender  Msk:  Back normal, normal gait. Normal strength and tone for age. Extremities: No clubbing, cyanosis or edema.   Neuro: Alert and oriented X 3. Psych:  Good affect, responds appropriately  TELEMETRY: Sinus bradycardia at 58 bpm  ASSESSMENT AND PLAN: The patient is status post drug-eluting stent to the mid LAD with residual disease, ramus 1 lesion 80% stenosed, ramus 2 lesion 80% stenosed, RCA lesion 30% stenosed. The patient has recovered well and will be discharged home on dual antiplatelet therapy of Brilinta and aspirin 81 mg. He is on simvastatin and an ACE inhibitor. He is not on a beta blocker due to bradycardia.  The patient will follow-up in the office on Friday, June 30 at 11:00  Active Problems:   Unstable angina [facility])    Daune Perch, NP 03/13/2016 9:48 AM

## 2016-03-15 ENCOUNTER — Emergency Department
Admission: EM | Admit: 2016-03-15 | Discharge: 2016-03-15 | Disposition: A | Discharge status: Home / Self Care | Payer: Medicare Other | Attending: Emergency Medicine | Admitting: Emergency Medicine

## 2016-03-15 ENCOUNTER — Telehealth: Payer: Self-pay | Admitting: Gastroenterology

## 2016-03-15 DIAGNOSIS — Z79899 Other long term (current) drug therapy: Secondary | ICD-10-CM | POA: Insufficient documentation

## 2016-03-15 DIAGNOSIS — I129 Hypertensive chronic kidney disease with stage 1 through stage 4 chronic kidney disease, or unspecified chronic kidney disease: Secondary | ICD-10-CM | POA: Diagnosis not present

## 2016-03-15 DIAGNOSIS — Z8719 Personal history of other diseases of the digestive system: Secondary | ICD-10-CM | POA: Diagnosis not present

## 2016-03-15 DIAGNOSIS — E039 Hypothyroidism, unspecified: Secondary | ICD-10-CM | POA: Insufficient documentation

## 2016-03-15 DIAGNOSIS — E785 Hyperlipidemia, unspecified: Secondary | ICD-10-CM | POA: Diagnosis not present

## 2016-03-15 DIAGNOSIS — K611 Rectal abscess: Secondary | ICD-10-CM

## 2016-03-15 DIAGNOSIS — Z7982 Long term (current) use of aspirin: Secondary | ICD-10-CM | POA: Diagnosis not present

## 2016-03-15 DIAGNOSIS — I2510 Atherosclerotic heart disease of native coronary artery without angina pectoris: Secondary | ICD-10-CM | POA: Insufficient documentation

## 2016-03-15 DIAGNOSIS — Z955 Presence of coronary angioplasty implant and graft: Secondary | ICD-10-CM | POA: Insufficient documentation

## 2016-03-15 DIAGNOSIS — N183 Chronic kidney disease, stage 3 (moderate): Secondary | ICD-10-CM | POA: Insufficient documentation

## 2016-03-15 DIAGNOSIS — K644 Residual hemorrhoidal skin tags: Secondary | ICD-10-CM

## 2016-03-15 DIAGNOSIS — Z87891 Personal history of nicotine dependence: Secondary | ICD-10-CM | POA: Diagnosis not present

## 2016-03-15 DIAGNOSIS — L539 Erythematous condition, unspecified: Secondary | ICD-10-CM | POA: Diagnosis present

## 2016-03-15 MED ORDER — LIDOCAINE-EPINEPHRINE (PF) 1 %-1:200000 IJ SOLN
10.0000 mL | Freq: Once | INTRAMUSCULAR | Status: AC
  Administered 2016-03-15: 10 mL
  Filled 2016-03-15: qty 30

## 2016-03-15 MED ORDER — OXYCODONE-ACETAMINOPHEN 5-325 MG PO TABS: 1.0000 | ORAL_TABLET | ORAL | Status: DC | PRN

## 2016-03-15 MED ORDER — OXYCODONE-ACETAMINOPHEN 5-325 MG PO TABS
2.0000 | ORAL_TABLET | Freq: Once | ORAL | Status: AC
  Administered 2016-03-15: 2 via ORAL
  Filled 2016-03-15: qty 2

## 2016-03-15 MED ORDER — SULFAMETHOXAZOLE-TRIMETHOPRIM 800-160 MG PO TABS: 1.0000 | ORAL_TABLET | Freq: Two times a day (BID) | ORAL | Status: DC

## 2016-03-15 NOTE — ED Provider Notes (Signed)
[facility] Emergency Department Provider Note   ____________________________________________  Time seen: Approximately 12:26 PM  I have reviewed the triage vital signs and the nursing notes.   HISTORY  Chief Complaint Rectal Pain   HPI Corey Perry is a 69 y.o. male is here complaining of rectal pain. Patient states that for 4 days he has had rectal pain and today when he was being seen at Dr. Ruben Gottron office he mentioned having pain. Attempts were made to call someone at [facility] surgical and apparently the office is closed. Patient was sent to the emergency room for evaluation. With talking with the patient he does not have any history of constipation. He denies any previous hemorrhoid problems. He rates his pain is 7 out of 10.Pain is increased with sitting or walking. Patient has not found any position that actually relieves his pain.   Past Medical History  Diagnosis Date  . Hyperlipidemia   . Hypertension   . Chronic kidney disease   . Bradycardia   . CAD (coronary artery disease)   . Spinal stenosis     lumbar  . GERD (gastroesophageal reflux disease)   . Hypothyroidism   . Dysrhythmia     bradycardia - sees Dr. Humphrey Rolls  . BPH (benign prostatic hypertrophy)   . Sleep apnea     mild - SS - Care everywhere -04/18/07  . Abnormal white blood cell (WBC) count     seeing Dr. [name] at [place] on 10/25    Patient Active Problem List   Diagnosis Date Noted  . Unstable angina ([place]) 03/12/2016  . Special screening for malignant neoplasms, colon   . Benign neoplasm of ascending colon   . Rectal polyp   . Hypertensive kidney disease with CKD stage III 06/22/2015  . CAD (coronary artery disease) 06/21/2015  . BPH (benign prostatic hyperplasia) 06/21/2015  . Acid reflux 06/21/2015  . Hypothyroid   . Hyperlipidemia   . Hypertension     Past Surgical History  Procedure Laterality Date  . Back surgery    . Hernia repair      x3  . Colonoscopy  with propofol N/A 07/13/2015    Procedure: COLONOSCOPY WITH PROPOFOL;  Surgeon: Lucilla Lame, MD;  Location: [place];  Service: Endoscopy;  Laterality: N/A;  . Polypectomy  07/13/2015    Procedure: POLYPECTOMY;  Surgeon: Lucilla Lame, MD;  Location: [place];  Service: Endoscopy;;  . Coronary angioplasty  10/05 and 12/05    4 DES placed  . Cardiac catheterization Left 03/12/2016    Procedure: Left Heart Cath and Coronary Angiography;  Surgeon: [name], MD;  Location: [place] [facility];  Service: Cardiovascular;  Laterality: Left;  . Cardiac catheterization N/A 03/12/2016    Procedure: Coronary Stent Intervention;  Surgeon: Yolonda Kida, MD;  Location: [place] [facility];  Service: Cardiovascular;  Laterality: N/A;    Current Outpatient Rx  Name  Route  Sig  Dispense  Refill  . Ascorbic Acid (VITAMIN C) 1000 MG tablet   Oral   Take 1,000 mg by mouth daily.         Marland Kitchen aspirin 81 MG tablet   Oral   Take 81 mg by mouth daily. AM         . GARLIC HIGH POTENCY PO   Oral   Take by mouth daily.         . isosorbide mononitrate (IMDUR) 30 MG 24 hr tablet  30 mg daily. AM      5   . levothyroxine (SYNTHROID, LEVOTHROID) 50 MCG tablet      TAKE 1 TABLET BY MOUTH EVERY DAY   90 tablet   3   . lisinopril (PRINIVIL,ZESTRIL) 10 MG tablet   Oral   Take 1 tablet (10 mg total) by mouth daily.   90 tablet   4   . multivitamin-iron-minerals-folic acid (CENTRUM) chewable tablet   Oral   Chew 1 tablet by mouth daily.         Marland Kitchen oxyCODONE-acetaminophen (PERCOCET) 5-325 MG tablet   Oral   Take 1 tablet by mouth every 4 (four) hours as needed for severe pain.   20 tablet   0   . pantoprazole (PROTONIX) 20 MG tablet   Oral   Take 1 tablet (20 mg total) by mouth daily.   90 tablet   4   . simvastatin (ZOCOR) 40 MG tablet   Oral   Take 1 tablet (40 mg total) by mouth daily.   90 tablet   4   . sulfamethoxazole-trimethoprim (BACTRIM  DS,SEPTRA DS) 800-160 MG tablet   Oral   Take 1 tablet by mouth 2 (two) times daily.   20 tablet   0   . ticagrelor (BRILINTA) 90 MG TABS tablet   Oral   Take 1 tablet (90 mg total) by mouth 2 (two) times daily.   180 tablet   3     Dispense as written.   . vitamin E (VITAMIN E) 400 UNIT capsule   Oral   Take 400 Units by mouth daily.           Allergies Review of patient's allergies indicates no known allergies.  Family History  Problem Relation Age of Onset  . Hypertension Mother   . Heart disease Father   . Hypertension Father   . Kidney disease Father   . Hypertension Brother   . Diabetes Son     Social History Social History  Substance Use Topics  . Smoking status: Former Smoker -- 1.00 packs/day for 40 years    Types: Cigarettes    Quit date: 07/16/2004  . Smokeless tobacco: Former Systems developer    Types: Chew  . Alcohol Use: No    Review of Systems Constitutional: No fever/chills Cardiovascular: Denies chest pain. Respiratory: Denies shortness of breath. Gastrointestinal: No abdominal pain.  No nausea, no vomiting.  No diarrhea.  No constipation. Musculoskeletal: Negative for back pain. Skin: Negative for rash.Questionable skin swelling around rectum Neurological: Negative for headaches, focal weakness or numbness.  10-point ROS otherwise negative.  ____________________________________________   PHYSICAL EXAM:  VITAL SIGNS: ED Triage Vitals  Enc Vitals Group     BP 03/15/16 1213 165/76 mmHg     Pulse Rate 03/15/16 1213 55     Resp 03/15/16 1213 18     Temp 03/15/16 1213 98.2 F (36.8 C)     Temp Source 03/15/16 1213 Oral     SpO2 03/15/16 1213 98 %     Weight 03/15/16 1213 215 lb (97.523 kg)     Height 03/15/16 1213 '5\' 9"'$  (1.753 m)     Head Cir --      Peak Flow --      Pain Score 03/15/16 1214 7     Pain Loc --      Pain Edu? --      Excl. in [place]? --     Constitutional: Alert and oriented. Well appearing and in  no acute distress. Eyes:  Conjunctivae are normal. PERRL. EOMI. Head: Atraumatic. Nose: No congestion/rhinnorhea. Neck: No stridor.   Cardiovascular: Normal rate, regular rhythm. Grossly normal heart sounds.  Good peripheral circulation. Respiratory: Normal respiratory effort.  No retractions. Lungs CTAB. Musculoskeletal: Moves upper respiratory studies without any difficulty. Neurologic:  Normal speech and language. No gross focal neurologic deficits are appreciated. No gait instability. Skin:  Skin is warm, dry and intact. Rectal area: There is an external hemorrhoid present with moderate tenderness to palpation. Anteriorly there is an area of erythema and fluctuance. There is no surrounding cellulitis extending from this area. Psychiatric: Mood and affect are normal. Speech and behavior are normal.  ____________________________________________   LABS (all labs ordered are listed, but only abnormal results are displayed)  Labs Reviewed - No data to display   PROCEDURES  Procedure(s) performed: INCISION AND DRAINAGE Performed by: Johnn Hai Consent: Verbal consent obtained. Risks and benefits: risks, benefits and alternatives were discussed Type: abscess  Body area: External hemorrhoid  Anesthesia: local infiltration  Incision was made with a scalpel.  Local anesthetic: lidocaine 1 % with epinephrine  Anesthetic total: 2 ml  Complexity: complex Blunt dissection to break up loculations  Drainage: purulent  Drainage amount: Moderate   Packing material: 1/4 in iodoform gauze  Patient tolerance: Patient tolerated the procedure well with no immediate complications.    Critical Care performed: No  ____________________________________________   INITIAL IMPRESSION / ASSESSMENT AND PLAN / ED COURSE  Pertinent labs & imaging results that were available during my care of the patient were reviewed by me and considered in my medical decision making (see chart for details).  Patient  tolerated the procedure extremely well. There is moderate amount of purulent material drained from the area and area was reduced by half. Patient was placed on Bactrim DS twice a day for 10 days along with Percocet as needed for pain. Patient was encouraged to do sits baths 2-3 times a day. He is also return in 2 days for removal of the packing. He is also given the name of the surgeon on call for hemorrhoid surgery in the future. ____________________________________________   FINAL CLINICAL IMPRESSION(S) / ED DIAGNOSES  Final diagnoses:  Rectal abscess  External hemorrhoid      NEW MEDICATIONS STARTED DURING THIS VISIT:  Discharge Medication List as of 03/15/2016  2:18 PM    START taking these medications   Details  oxyCODONE-acetaminophen (PERCOCET) 5-325 MG tablet Take 1 tablet by mouth every 4 (four) hours as needed for severe pain., Starting 03/15/2016, Until Discontinued, Print    sulfamethoxazole-trimethoprim (BACTRIM DS,SEPTRA DS) 800-160 MG tablet Take 1 tablet by mouth 2 (two) times daily., Starting 03/15/2016, Until Discontinued, Print         Note:  This document was prepared using Dragon voice recognition software and may include unintentional dictation errors.    Johnn Hai, PA-C 03/15/16 [place], MD 03/15/16 [phone]

## 2016-03-15 NOTE — Discharge Instructions (Signed)
Abscess An abscess (boil or furuncle) is an infected area on or under the skin. This area is filled with yellowish-white fluid (pus) and other material (debris). HOME CARE   Only take medicines as told by your doctor.  If you were given antibiotic medicine, take it as directed. Finish the medicine even if you start to feel better.  If gauze is used, follow your doctor's directions for changing the gauze.  To avoid spreading the infection:  Keep your abscess covered with a bandage.  Wash your hands well.  Do not share personal care items, towels, or whirlpools with others.  Avoid skin contact with others.  Keep your skin and clothes clean around the abscess.  Keep all doctor visits as told. GET HELP RIGHT AWAY IF:   You have more pain, puffiness (swelling), or redness in the wound site.  You have more fluid or blood coming from the wound site.  You have muscle aches, chills, or you feel sick.  You have a fever. MAKE SURE YOU:   Understand these instructions.  Will watch your condition.  Will get help right away if you are not doing well or get worse.   This information is not intended to replace advice given to you by your health care provider. Make sure you discuss any questions you have with your health care provider.   Document Released: 02/19/2008 Document Revised: 03/03/2012 Document Reviewed: 11/16/2011 Elsevier Interactive Patient Education 2016 [name].  Hemorrhoids Hemorrhoids are puffy (swollen) veins around the rectum or anus. Hemorrhoids can cause pain, itching, bleeding, or irritation. HOME CARE  Eat foods with fiber, such as whole grains, beans, nuts, fruits, and vegetables. Ask your doctor about taking products with added fiber in them (fibersupplements).  Drink enough fluid to keep your pee (urine) clear or pale yellow.  Exercise often.  Go to the bathroom when you have the urge to poop. Do not wait.  Avoid straining to poop (bowel  movement).  Keep the butt area dry and clean. Use wet toilet paper or moist paper towels.  Medicated creams and medicine inserted into the anus (anal suppository) may be used or applied as told.  Only take medicine as told by your doctor.  Take a warm water bath (sitz bath) for 15-20 minutes to ease pain. Do this 3-4 times a day.  Place ice packs on the area if it is tender or puffy. Use the ice packs between the warm water baths.  Put ice in a plastic bag.  Place a towel between your skin and the bag.  Leave the ice on for 15-20 minutes, 03-04 times a day.  Do not use a donut-shaped pillow or sit on the toilet for a long time. GET HELP RIGHT AWAY IF:   You have more pain that is not controlled by treatment or medicine.  You have bleeding that will not stop.  You have trouble or are unable to poop (bowel movement).  You have pain or puffiness outside the area of the hemorrhoids. MAKE SURE YOU:   Understand these instructions.  Will watch your condition.  Will get help right away if you are not doing well or get worse.   This information is not intended to replace advice given to you by your health care provider. Make sure you discuss any questions you have with your health care provider.   Document Released: 06/11/2008 Document Revised: 08/19/2012 Document Reviewed: 07/14/2012 Elsevier Interactive Patient Education 2016 Corey Perry taking Percocet as  needed for pain. You may take 1 or 2 every 4 hours as needed. Also while taking pain medication begin taking a stool softener to prevent constipation. Bactrim DS twice a day for 10 days for infection. Soak in a tub of warm water often. Return to the emergency room in 2 days for drain removal. Follow-up with Dr. Adonis Perry if any continued problems. Call his office on Monday for an appointment.

## 2016-03-15 NOTE — Telephone Encounter (Signed)
Patient had a colonoscopy with Dr Allen Norris in October 2016. Patient called stating he had seen Dr Humphrey Rolls, his cardiologist, for follow up appointment today. While there patient states he was having a lot of pain in his rectum so he asked Dr Humphrey Rolls to look at it and was told it appears to be a hemorrhoid and that he would need to see a Psychologist, sport and exercise. He called our office, but we did not have a surgeon that could see him today. Patient states that he is in a lot of pain and having trouble walking, so it was suggested to him to go to the ER.

## 2016-03-15 NOTE — ED Notes (Signed)
Pt states that for approx 4 days he has been having rectal pain, pt states that there seems to be a "bag" hanging from his rectum, pt was seen at Dr Laurelyn Sickle office prior to arrival and was sent here because there was no one at [facility] surgical

## 2016-03-17 ENCOUNTER — Encounter: Payer: Self-pay | Admitting: *Deleted

## 2016-03-17 ENCOUNTER — Emergency Department
Admission: EM | Admit: 2016-03-17 | Discharge: 2016-03-17 | Disposition: A | Discharge status: Home / Self Care | Payer: Medicare Other | Attending: Emergency Medicine | Admitting: Emergency Medicine

## 2016-03-17 DIAGNOSIS — N183 Chronic kidney disease, stage 3 (moderate): Secondary | ICD-10-CM | POA: Diagnosis not present

## 2016-03-17 DIAGNOSIS — E039 Hypothyroidism, unspecified: Secondary | ICD-10-CM | POA: Insufficient documentation

## 2016-03-17 DIAGNOSIS — I2510 Atherosclerotic heart disease of native coronary artery without angina pectoris: Secondary | ICD-10-CM | POA: Insufficient documentation

## 2016-03-17 DIAGNOSIS — E785 Hyperlipidemia, unspecified: Secondary | ICD-10-CM | POA: Diagnosis not present

## 2016-03-17 DIAGNOSIS — I129 Hypertensive chronic kidney disease with stage 1 through stage 4 chronic kidney disease, or unspecified chronic kidney disease: Secondary | ICD-10-CM | POA: Diagnosis not present

## 2016-03-17 DIAGNOSIS — Z7982 Long term (current) use of aspirin: Secondary | ICD-10-CM | POA: Insufficient documentation

## 2016-03-17 DIAGNOSIS — Z5189 Encounter for other specified aftercare: Secondary | ICD-10-CM

## 2016-03-17 DIAGNOSIS — Z4801 Encounter for change or removal of surgical wound dressing: Secondary | ICD-10-CM | POA: Diagnosis not present

## 2016-03-17 DIAGNOSIS — Z87891 Personal history of nicotine dependence: Secondary | ICD-10-CM | POA: Insufficient documentation

## 2016-03-17 NOTE — Discharge Instructions (Signed)
Finish antibiotics. Continue sitz baths once a day. Follow-up with your primary care doctor if any continued problems and get referred to a surgeon if any continued problems with hemorrhoids.

## 2016-03-17 NOTE — ED Provider Notes (Signed)
[facility] Emergency Department Provider Note   ____________________________________________  Time seen: Approximately 2:32 PM  I have reviewed the triage vital signs and the nursing notes.   HISTORY  Chief Complaint Wound Check   HPI Corey Perry is a 70 y.o. male is here for recheck of his I&D that was done 2 days ago. Patient states that he is improved greatly and that pain is almost completely subsided.   Past Medical History  Diagnosis Date  . Hyperlipidemia   . Hypertension   . Chronic kidney disease   . Bradycardia   . CAD (coronary artery disease)   . Spinal stenosis     lumbar  . GERD (gastroesophageal reflux disease)   . Hypothyroidism   . Dysrhythmia     bradycardia - sees Dr. Humphrey Rolls  . BPH (benign prostatic hypertrophy)   . Sleep apnea     mild - SS - Care everywhere -04/18/07  . Abnormal white blood cell (WBC) count     seeing Dr. [name] at [place] on 10/25    Patient Active Problem List   Diagnosis Date Noted  . Unstable angina ([place]) 03/12/2016  . Special screening for malignant neoplasms, colon   . Benign neoplasm of ascending colon   . Rectal polyp   . Hypertensive kidney disease with CKD stage III 06/22/2015  . CAD (coronary artery disease) 06/21/2015  . BPH (benign prostatic hyperplasia) 06/21/2015  . Acid reflux 06/21/2015  . Hypothyroid   . Hyperlipidemia   . Hypertension     Past Surgical History  Procedure Laterality Date  . Back surgery    . Hernia repair      x3  . Colonoscopy with propofol N/A 07/13/2015    Procedure: COLONOSCOPY WITH PROPOFOL;  Surgeon: Lucilla Lame, MD;  Location: [place];  Service: Endoscopy;  Laterality: N/A;  . Polypectomy  07/13/2015    Procedure: POLYPECTOMY;  Surgeon: Lucilla Lame, MD;  Location: [place];  Service: Endoscopy;;  . Coronary angioplasty  10/05 and 12/05    4 DES placed  . Cardiac catheterization Left 03/12/2016    Procedure: Left Heart  Cath and Coronary Angiography;  Surgeon: [name], MD;  Location: [place] [facility];  Service: Cardiovascular;  Laterality: Left;  . Cardiac catheterization N/A 03/12/2016    Procedure: Coronary Stent Intervention;  Surgeon: Yolonda Kida, MD;  Location: [place] [facility];  Service: Cardiovascular;  Laterality: N/A;    Current Outpatient Rx  Name  Route  Sig  Dispense  Refill  . Ascorbic Acid (VITAMIN C) 1000 MG tablet   Oral   Take 1,000 mg by mouth daily.         Marland Kitchen aspirin 81 MG tablet   Oral   Take 81 mg by mouth daily. AM         . GARLIC HIGH POTENCY PO   Oral   Take by mouth daily.         . isosorbide mononitrate (IMDUR) 30 MG 24 hr tablet      30 mg daily. AM      5   . levothyroxine (SYNTHROID, LEVOTHROID) 50 MCG tablet      TAKE 1 TABLET BY MOUTH EVERY DAY   90 tablet   3   . lisinopril (PRINIVIL,ZESTRIL) 10 MG tablet   Oral   Take 1 tablet (10 mg total) by mouth daily.   90 tablet   4   . multivitamin-iron-minerals-folic acid (  CENTRUM) chewable tablet   Oral   Chew 1 tablet by mouth daily.         Marland Kitchen oxyCODONE-acetaminophen (PERCOCET) 5-325 MG tablet   Oral   Take 1 tablet by mouth every 4 (four) hours as needed for severe pain.   20 tablet   0   . pantoprazole (PROTONIX) 20 MG tablet   Oral   Take 1 tablet (20 mg total) by mouth daily.   90 tablet   4   . simvastatin (ZOCOR) 40 MG tablet   Oral   Take 1 tablet (40 mg total) by mouth daily.   90 tablet   4   . sulfamethoxazole-trimethoprim (BACTRIM DS,SEPTRA DS) 800-160 MG tablet   Oral   Take 1 tablet by mouth 2 (two) times daily.   20 tablet   0   . ticagrelor (BRILINTA) 90 MG TABS tablet   Oral   Take 1 tablet (90 mg total) by mouth 2 (two) times daily.   180 tablet   3     Dispense as written.   . vitamin E (VITAMIN E) 400 UNIT capsule   Oral   Take 400 Units by mouth daily.           Allergies Review of patient's allergies indicates no known  allergies.  Family History  Problem Relation Age of Onset  . Hypertension Mother   . Heart disease Father   . Hypertension Father   . Kidney disease Father   . Hypertension Brother   . Diabetes Son     Social History Social History  Substance Use Topics  . Smoking status: Former Smoker -- 1.00 packs/day for 40 years    Types: Cigarettes    Quit date: 07/16/2004  . Smokeless tobacco: Former Systems developer    Types: Chew  . Alcohol Use: No    Review of Systems Constitutional: No fever/chills Cardiovascular: Denies chest pain. Respiratory: Denies shortness of breath. Gastrointestinal:  No nausea, no vomiting.   Skin:Positive for wound.   10-point ROS otherwise negative.  ____________________________________________   PHYSICAL EXAM:  VITAL SIGNS: ED Triage Vitals  Enc Vitals Group     BP 03/17/16 1341 120/78 mmHg     Pulse Rate 03/17/16 1341 54     Resp 03/17/16 1341 20     Temp 03/17/16 1341 98.9 F (37.2 C)     Temp Source 03/17/16 1341 Oral     SpO2 03/17/16 1341 99 %     Weight 03/17/16 1341 215 lb (97.523 kg)     Height 03/17/16 1341 '5\' 9"'$  (1.753 m)     Head Cir --      Peak Flow --      Pain Score --      Pain Loc --      Pain Edu? --      Excl. in [place]? --     Constitutional: Alert and oriented. Well appearing and in no acute distress. Head: Atraumatic. Nose: No congestion/rhinnorhea. Neck: No stridor.   Respiratory: Normal respiratory effort.  No retractions.  Musculoskeletal: Moves upper and lower extremities without any difficulty. Normal gait was noted. Neurologic:  Normal speech and language. No gross focal neurologic deficits are appreciated.  Skin:  Skin is warm, dry and intact. External hemorrhoid that was I&D 2 days ago is reduced greatly to approximately less than 1 cm in diameter. There is no continued drainage and iodoform gauze is completely out. There is no tenderness on palpation. Psychiatric: Mood and affect are  normal. Speech and behavior are  normal.  ____________________________________________   LABS (all labs ordered are listed, but only abnormal results are displayed)  Labs Reviewed - No data to display  PROCEDURES  Procedure(s) performed: None  Critical Care performed: No  ____________________________________________   INITIAL IMPRESSION / ASSESSMENT AND PLAN / ED COURSE  Pertinent labs & imaging results that were available during my care of the patient were reviewed by me and considered in my medical decision making (see chart for details).  Patient will follow up with his primary care doctor if any continued problems he'll make arrangements to see a surgeon in the future. Patient will continue taking the antibiotic until completely gone. ____________________________________________   FINAL CLINICAL IMPRESSION(S) / ED DIAGNOSES  Final diagnoses:  Encounter for wound re-check      NEW MEDICATIONS STARTED DURING THIS VISIT:  Discharge Medication List as of 03/17/2016  2:48 PM       Note:  This document was prepared using Dragon voice recognition software and may include unintentional dictation errors.    Johnn Hai, PA-C 03/17/16 [place], MD 03/18/16 [phone]

## 2016-03-17 NOTE — ED Notes (Signed)
Patient here on Friday for hemorrhoid that was lanced by [facility].  Pt states since then the pain has been much improved.  Pt states he is here today for recheck of wound and to have packing removed.

## 2016-03-17 NOTE — ED Notes (Signed)
Pt is here for packing removal from a hemorrhoid

## 2016-04-10 ENCOUNTER — Inpatient Hospital Stay: Payer: Medicare Other | Attending: Oncology | Admitting: Oncology

## 2016-04-10 ENCOUNTER — Inpatient Hospital Stay: Payer: Medicare Other

## 2016-04-10 DIAGNOSIS — I129 Hypertensive chronic kidney disease with stage 1 through stage 4 chronic kidney disease, or unspecified chronic kidney disease: Secondary | ICD-10-CM | POA: Diagnosis not present

## 2016-04-10 DIAGNOSIS — Z79899 Other long term (current) drug therapy: Secondary | ICD-10-CM | POA: Insufficient documentation

## 2016-04-10 DIAGNOSIS — E785 Hyperlipidemia, unspecified: Secondary | ICD-10-CM | POA: Diagnosis not present

## 2016-04-10 DIAGNOSIS — I2510 Atherosclerotic heart disease of native coronary artery without angina pectoris: Secondary | ICD-10-CM | POA: Diagnosis not present

## 2016-04-10 DIAGNOSIS — E039 Hypothyroidism, unspecified: Secondary | ICD-10-CM | POA: Diagnosis not present

## 2016-04-10 DIAGNOSIS — N189 Chronic kidney disease, unspecified: Secondary | ICD-10-CM | POA: Insufficient documentation

## 2016-04-10 DIAGNOSIS — K219 Gastro-esophageal reflux disease without esophagitis: Secondary | ICD-10-CM | POA: Insufficient documentation

## 2016-04-10 DIAGNOSIS — I10 Essential (primary) hypertension: Secondary | ICD-10-CM | POA: Diagnosis not present

## 2016-04-10 DIAGNOSIS — D696 Thrombocytopenia, unspecified: Secondary | ICD-10-CM | POA: Diagnosis not present

## 2016-04-10 DIAGNOSIS — Z87891 Personal history of nicotine dependence: Secondary | ICD-10-CM

## 2016-04-10 DIAGNOSIS — N400 Enlarged prostate without lower urinary tract symptoms: Secondary | ICD-10-CM | POA: Diagnosis not present

## 2016-04-10 DIAGNOSIS — D72819 Decreased white blood cell count, unspecified: Secondary | ICD-10-CM | POA: Diagnosis not present

## 2016-04-10 DIAGNOSIS — G4730 Sleep apnea, unspecified: Secondary | ICD-10-CM | POA: Diagnosis not present

## 2016-04-10 DIAGNOSIS — R001 Bradycardia, unspecified: Secondary | ICD-10-CM | POA: Diagnosis not present

## 2016-04-10 DIAGNOSIS — M4806 Spinal stenosis, lumbar region: Secondary | ICD-10-CM | POA: Insufficient documentation

## 2016-04-10 LAB — CBC WITH DIFFERENTIAL/PLATELET
Basophils Absolute: 0 10*3/uL (ref 0–0.1)
Basophils Relative: 0 %
Eosinophils Absolute: 0.1 10*3/uL (ref 0–0.7)
Eosinophils Relative: 2 %
HCT: 39.1 % — ABNORMAL LOW (ref 40.0–52.0)
Hemoglobin: 13.6 g/dL (ref 13.0–18.0)
Lymphocytes Relative: 34 %
Lymphs Abs: 0.9 10*3/uL — ABNORMAL LOW (ref 1.0–3.6)
MCH: 34.3 pg — ABNORMAL HIGH (ref 26.0–34.0)
MCHC: 34.8 g/dL (ref 32.0–36.0)
MCV: 98.4 fL (ref 80.0–100.0)
Monocytes Absolute: 0.3 10*3/uL (ref 0.2–1.0)
Monocytes Relative: 11 %
Neutro Abs: 1.4 10*3/uL (ref 1.4–6.5)
Neutrophils Relative %: 53 %
Platelets: 82 10*3/uL — ABNORMAL LOW (ref 150–440)
RBC: 3.97 MIL/uL — ABNORMAL LOW (ref 4.40–5.90)
RDW: 14.5 % (ref 11.5–14.5)
WBC: 2.8 10*3/uL — ABNORMAL LOW (ref 3.8–10.6)

## 2016-04-10 NOTE — Progress Notes (Signed)
Corey Perry  [phone]) [phone] [phone]) [phone]  ID: Corey Perry OB: June 17, 1946  MR#: [id number]  DJS#:[id number]  Patient Care Team: [name], MD as PCP - General (Family Medicine) [name], MD as PCP - Family Medicine (Family Medicine) [name], MD (Specialist) [name], MD as Referring Physician (Internal Medicine) [name], MD as Consulting Physician (Neurosurgery)  CHIEF COMPLAINT: Leukopenia, thrombocytopenia.  INTERVAL HISTORY: Patient returns to clinic today for further evaluation and repeat laboratory work. He continues to feel well and is asymptomatic. He denies any recent fevers or illnesses. He has a good appetite and denies weight loss. He denies any bony pain. He has no neurologic complaints. He denies any chest pain or shortness of breath. He denies any nausea, vomiting, constipation, or diarrhea. He has no urinary complaints. Patient feels at his baseline and offers no specific complaints today.  REVIEW OF SYSTEMS:   Review of Systems  Constitutional: Negative.  Negative for fever, malaise/fatigue and weight loss.  Respiratory: Negative.  Negative for shortness of breath.   Cardiovascular: Negative.  Negative for chest pain.  Gastrointestinal: Negative.  Negative for abdominal pain, blood in stool and melena.  Musculoskeletal: Negative.   Neurological: Negative.  Negative for weakness.  Psychiatric/Behavioral: Negative.     As per HPI. Otherwise, a complete review of systems is negatve.  PAST MEDICAL HISTORY: Past Medical History:  Diagnosis Date  . Abnormal white blood cell (WBC) count    seeing Dr. [name] at [place] on 10/25  . BPH (benign prostatic hypertrophy)   . Bradycardia   . CAD (coronary artery disease)   . Chronic kidney disease   . Dysrhythmia    bradycardia - sees Dr. Humphrey Rolls  . GERD (gastroesophageal reflux disease)   . Hyperlipidemia   . Hypertension   . Hypothyroidism   . Sleep  apnea    mild - SS - Care everywhere -04/18/07  . Spinal stenosis    lumbar    PAST SURGICAL HISTORY: Past Surgical History:  Procedure Laterality Date  . BACK SURGERY    . CARDIAC CATHETERIZATION Left 03/12/2016   Procedure: Left Heart Cath and Coronary Angiography;  Surgeon: [name], MD;  Location: [facility];  Service: Cardiovascular;  Laterality: Left;  . CARDIAC CATHETERIZATION N/A 03/12/2016   Procedure: Coronary Stent Intervention;  Surgeon: Yolonda Kida, MD;  Location: [facility];  Service: Cardiovascular;  Laterality: N/A;  . COLONOSCOPY WITH PROPOFOL N/A 07/13/2015   Procedure: COLONOSCOPY WITH PROPOFOL;  Surgeon: Lucilla Lame, MD;  Location: [facility];  Service: Endoscopy;  Laterality: N/A;  . CORONARY ANGIOPLASTY  10/05 and 12/05   4 DES placed  . HERNIA REPAIR     x3  . POLYPECTOMY  07/13/2015   Procedure: POLYPECTOMY;  Surgeon: Lucilla Lame, MD;  Location: [facility];  Service: Endoscopy;;    FAMILY HISTORY Family History  Problem Relation Age of Onset  . Hypertension Mother   . Heart disease Father   . Hypertension Father   . Kidney disease Father   . Hypertension Brother   . Diabetes Son        ADVANCED DIRECTIVES:    HEALTH MAINTENANCE: Social History  Substance Use Topics  . Smoking status: Former Smoker    Packs/day: 1.00    Years: 40.00    Types: Cigarettes    Quit date: 07/16/2004  . Smokeless tobacco: Former Systems developer    Types: Chew  . Alcohol use No  Colonoscopy:  PAP:  Bone density:  Lipid panel:  No Known Allergies  Current Outpatient Prescriptions  Medication Sig Dispense Refill  . Ascorbic Acid (VITAMIN C) 1000 MG tablet Take 1,000 mg by mouth daily.    Marland Kitchen aspirin 81 MG tablet Take 81 mg by mouth daily. AM    . GARLIC HIGH POTENCY PO Take by mouth daily.    . isosorbide mononitrate (IMDUR) 30 MG 24 hr tablet 30 mg daily. AM  5  . levothyroxine (SYNTHROID, LEVOTHROID) 50 MCG tablet  TAKE 1 TABLET BY MOUTH EVERY DAY 90 tablet 3  . lisinopril (PRINIVIL,ZESTRIL) 10 MG tablet Take 1 tablet (10 mg total) by mouth daily. 90 tablet 4  . multivitamin-iron-minerals-folic acid (CENTRUM) chewable tablet Chew 1 tablet by mouth daily.    . nitroGLYCERIN (NITROSTAT) 0.4 MG SL tablet Place 1 tablet under the tongue every 5 (five) minutes as needed.  98  . pantoprazole (PROTONIX) 20 MG tablet Take 1 tablet (20 mg total) by mouth daily. 90 tablet 4  . simvastatin (ZOCOR) 40 MG tablet Take 1 tablet (40 mg total) by mouth daily. 90 tablet 4  . ticagrelor (BRILINTA) 90 MG TABS tablet Take 1 tablet (90 mg total) by mouth 2 (two) times daily. 180 tablet 3  . vitamin E (VITAMIN E) 400 UNIT capsule Take 400 Units by mouth daily.     No current facility-administered medications for this visit.     OBJECTIVE: Vitals:   04/10/16 1048  BP: 130/72  Pulse: (!) 48  Resp: 19  Temp: (!) 96.3 F (35.7 C)     There is no height or weight on file to calculate BMI.    ECOG FS:0 - Asymptomatic  General: Well-developed, well-nourished, no acute distress. Eyes: Pink conjunctiva, anicteric sclera. Lungs: Clear to auscultation bilaterally. Heart: Regular rate and rhythm. No rubs, murmurs, or gallops. Abdomen: Soft, nontender, nondistended. No organomegaly noted, normoactive bowel sounds. Musculoskeletal: No edema, cyanosis, or clubbing. Neuro: Alert, answering all questions appropriately. Cranial nerves grossly intact. Skin: No rashes or petechiae noted. Psych: Normal affect.   LAB RESULTS:  Lab Results  Component Value Date   NA 137 03/12/2016   K 4.4 03/12/2016   CL 105 03/12/2016   CO2 24 03/12/2016   GLUCOSE 114 (H) 03/12/2016   BUN 20 03/12/2016   CREATININE 1.37 (H) 03/12/2016   CALCIUM 8.9 03/12/2016   PROT 6.9 06/21/2015   ALBUMIN 4.8 06/21/2015   AST 26 12/25/2015   ALT 26 12/25/2015   ALKPHOS 46 06/21/2015   BILITOT 0.6 06/21/2015   GFRNONAA 51 (L) 03/12/2016   GFRAA 59 (L)  03/12/2016    Lab Results  Component Value Date   WBC 2.8 (L) 04/10/2016   NEUTROABS 1.4 04/10/2016   HGB 13.6 04/10/2016   HCT 39.1 (L) 04/10/2016   MCV 98.4 04/10/2016   PLT 82 (L) 04/10/2016     STUDIES: No results found.  ASSESSMENT: Leukopenia, thrombocytopenia.  PLAN:    1. Leukopenia: Patient's white blood cell count is decreased, but essentially unchanged. He is also asymptomatic. SPEP and antineutrophil antibodies are negative. The remainder of his blood work is also negative or within normal limits. No intervention is needed at this time. Patient may have a component of MDS, but this would require bone marrow biopsy to determine. This is not necessary at this time and patient does not wish to undergo this procedure. Would consider one in the future if he became symptomatic or his cytopenias continued to decline.  Patient has requested no further follow-up. Continue to monitor white blood cell count and if ANC falls below 1.0 or patient becomes symptomatic please refer him back for further evaluation.  2. Thrombocytopenia: Mild. No bone marrow biopsy as above. Platelet antibodies are negative. No follow-up as above. Please refer patient back if patient develops easy bleeding or bruising or his platelet count falls below 50,000. 3. Hypertension: Patient's blood pressure is within normal limits today.  Patient expressed understanding and was in agreement with this plan. He also understands that He can call clinic at any time with any questions, concerns, or complaints.    [name], MD   04/13/2016 9:55 AM

## 2016-04-10 NOTE — Progress Notes (Signed)
States is feeling well. Offers no complaints. 

## 2016-04-15 ENCOUNTER — Ambulatory Visit: Payer: Medicare Other | Admitting: Family Medicine

## 2016-04-23 ENCOUNTER — Ambulatory Visit (INDEPENDENT_AMBULATORY_CARE_PROVIDER_SITE_OTHER): Payer: Medicare Other | Admitting: Family Medicine

## 2016-04-23 ENCOUNTER — Encounter: Payer: Self-pay | Admitting: Family Medicine

## 2016-04-23 DIAGNOSIS — I2583 Coronary atherosclerosis due to lipid rich plaque: Principal | ICD-10-CM

## 2016-04-23 DIAGNOSIS — K219 Gastro-esophageal reflux disease without esophagitis: Secondary | ICD-10-CM

## 2016-04-23 DIAGNOSIS — I2510 Atherosclerotic heart disease of native coronary artery without angina pectoris: Secondary | ICD-10-CM

## 2016-04-23 NOTE — Assessment & Plan Note (Signed)
Discussed dysphagia and reflux will increase Protonix to 20 mg twice a day. Because of ongoing dysphagia which is getting worse will refer to gastroenterology to further evaluate.

## 2016-04-23 NOTE — Assessment & Plan Note (Signed)
The current medical regimen is effective;  continue present plan and medications.  

## 2016-04-23 NOTE — Progress Notes (Signed)
BP 123/68 (BP Location: Left Arm, Patient Position: Sitting, Cuff Size: Normal)   Pulse (!) 55   Temp 97.4 F (36.3 C)   Wt 215 lb (97.5 kg) Comment: with shoes  SpO2 98%   BMI 31.75 kg/m    Subjective:    Patient ID: Corey Perry, male    DOB: 18-Feb-1946, 70 y.o.   MRN: [id number]  HPI: Corey Perry is a 70 y.o. male  Chief Complaint  Patient presents with  . patient choking when he eats    gets hiccups   Over the last 2 months or so patient has noticed marked dysphagia having trouble swallowing early in the meal and then developing hiccups. Sometimes followed by having to throw up stuck food. Sometimes water and standing up stretching is able to get food down. This problem seems to be getting worse over the last 2 months. Continues to take Protonix 20 mg once a day and other medicine is on med list.  Cardiovascular had a new stent put in about 2 months ago also is doing well without any cardiovascular symptoms. Going back to cardiology later today.  Relevant past medical, surgical, family and social history reviewed and updated as indicated. Interim medical history since our last visit reviewed. Allergies and medications reviewed and updated.  Review of Systems  Constitutional: Negative.   Respiratory: Negative.   Cardiovascular: Negative.     Per HPI unless specifically indicated above     Objective:    BP 123/68 (BP Location: Left Arm, Patient Position: Sitting, Cuff Size: Normal)   Pulse (!) 55   Temp 97.4 F (36.3 C)   Wt 215 lb (97.5 kg) Comment: with shoes  SpO2 98%   BMI 31.75 kg/m   Wt Readings from Last 3 Encounters:  04/23/16 215 lb (97.5 kg)  03/17/16 215 lb (97.5 kg)  03/15/16 215 lb (97.5 kg)    Physical Exam  Constitutional: He is oriented to person, place, and time. He appears well-developed and well-nourished. No distress.  HENT:  Head: Normocephalic and atraumatic.  Right Ear: Hearing and external ear normal.  Left Ear: Hearing  and external ear normal.  Nose: Nose normal.  Mouth/Throat: No oropharyngeal exudate.  Eyes: Conjunctivae and lids are normal. Right eye exhibits no discharge. Left eye exhibits no discharge. No scleral icterus.  Neck: No thyromegaly present.  Cardiovascular: Normal rate, regular rhythm and normal heart sounds.   Pulmonary/Chest: Effort normal and breath sounds normal. No respiratory distress.  Abdominal: Soft. Bowel sounds are normal. He exhibits no distension and no mass. There is no tenderness. There is no rebound and no guarding.  Musculoskeletal: Normal range of motion.  Lymphadenopathy:    He has no cervical adenopathy.  Neurological: He is alert and oriented to person, place, and time.  Skin: Skin is intact. No rash noted.  Psychiatric: He has a normal mood and affect. His speech is normal and behavior is normal. Judgment and thought content normal. Cognition and memory are normal.    Results for orders placed or performed in visit on 04/10/16  CBC with Differential  Result Value Ref Range   WBC 2.8 (L) 3.8 - 10.6 K/uL   RBC 3.97 (L) 4.40 - 5.90 MIL/uL   Hemoglobin 13.6 13.0 - 18.0 g/dL   HCT 39.1 (L) 40.0 - 52.0 %   MCV 98.4 80.0 - 100.0 fL   MCH 34.3 (H) 26.0 - 34.0 pg   MCHC 34.8 32.0 - 36.0 g/dL   RDW  14.5 11.5 - 14.5 %   Platelets 82 (L) 150 - 440 K/uL   Neutrophils Relative % 53% %   Neutro Abs 1.4 1.4 - 6.5 K/uL   Lymphocytes Relative 34% %   Lymphs Abs 0.9 (L) 1.0 - 3.6 K/uL   Monocytes Relative 11% %   Monocytes Absolute 0.3 0.2 - 1.0 K/uL   Eosinophils Relative 2% %   Eosinophils Absolute 0.1 0 - 0.7 K/uL   Basophils Relative 0% %   Basophils Absolute 0.0 0 - 0.1 K/uL      Assessment & Plan:   Problem List Items Addressed This Visit      Cardiovascular and Mediastinum   CAD (coronary artery disease)    The current medical regimen is effective;  continue present plan and medications.         Digestive   Acid reflux    Discussed dysphagia and reflux  will increase Protonix to 20 mg twice a day. Because of ongoing dysphagia which is getting worse will refer to gastroenterology to further evaluate.      Relevant Orders   Ambulatory referral to Gastroenterology    Other Visit Diagnoses   None.      Follow up plan: Return in about 3 months (around 07/24/2016) for Physical Exam.

## 2016-05-17 DIAGNOSIS — C159 Malignant neoplasm of esophagus, unspecified: Secondary | ICD-10-CM

## 2016-05-17 HISTORY — DX: Malignant neoplasm of esophagus, unspecified: C15.9

## 2016-05-22 ENCOUNTER — Other Ambulatory Visit: Payer: Self-pay | Admitting: Gastroenterology

## 2016-05-22 DIAGNOSIS — R1310 Dysphagia, unspecified: Secondary | ICD-10-CM

## 2016-05-24 ENCOUNTER — Ambulatory Visit
Admission: RE | Admit: 2016-05-24 | Discharge: 2016-05-24 | Disposition: A | Discharge status: Home / Self Care | Payer: Medicare Other | Source: Ambulatory Visit | Attending: Gastroenterology | Admitting: Gastroenterology

## 2016-05-24 DIAGNOSIS — K449 Diaphragmatic hernia without obstruction or gangrene: Secondary | ICD-10-CM | POA: Insufficient documentation

## 2016-05-24 DIAGNOSIS — K222 Esophageal obstruction: Secondary | ICD-10-CM | POA: Insufficient documentation

## 2016-05-24 DIAGNOSIS — R1310 Dysphagia, unspecified: Secondary | ICD-10-CM | POA: Diagnosis not present

## 2016-05-29 ENCOUNTER — Encounter (INDEPENDENT_AMBULATORY_CARE_PROVIDER_SITE_OTHER): Payer: Self-pay

## 2016-06-13 ENCOUNTER — Encounter: Payer: Self-pay | Admitting: *Deleted

## 2016-06-14 ENCOUNTER — Telehealth: Payer: Self-pay

## 2016-06-14 ENCOUNTER — Encounter
Admission: RE | Disposition: A | Discharge status: Home / Self Care | Payer: Self-pay | Source: Ambulatory Visit | Attending: Gastroenterology

## 2016-06-14 ENCOUNTER — Ambulatory Visit: Payer: Medicare Other | Admitting: Anesthesiology

## 2016-06-14 ENCOUNTER — Ambulatory Visit
Admission: RE | Admit: 2016-06-14 | Discharge: 2016-06-14 | Disposition: A | Discharge status: Home / Self Care | Payer: Medicare Other | Source: Ambulatory Visit | Attending: Gastroenterology | Admitting: Gastroenterology

## 2016-06-14 ENCOUNTER — Encounter: Payer: Self-pay | Admitting: *Deleted

## 2016-06-14 DIAGNOSIS — N189 Chronic kidney disease, unspecified: Secondary | ICD-10-CM | POA: Insufficient documentation

## 2016-06-14 DIAGNOSIS — K222 Esophageal obstruction: Secondary | ICD-10-CM | POA: Insufficient documentation

## 2016-06-14 DIAGNOSIS — E785 Hyperlipidemia, unspecified: Secondary | ICD-10-CM | POA: Insufficient documentation

## 2016-06-14 DIAGNOSIS — Z7982 Long term (current) use of aspirin: Secondary | ICD-10-CM | POA: Diagnosis not present

## 2016-06-14 DIAGNOSIS — K219 Gastro-esophageal reflux disease without esophagitis: Secondary | ICD-10-CM | POA: Insufficient documentation

## 2016-06-14 DIAGNOSIS — E039 Hypothyroidism, unspecified: Secondary | ICD-10-CM | POA: Insufficient documentation

## 2016-06-14 DIAGNOSIS — I129 Hypertensive chronic kidney disease with stage 1 through stage 4 chronic kidney disease, or unspecified chronic kidney disease: Secondary | ICD-10-CM | POA: Diagnosis not present

## 2016-06-14 DIAGNOSIS — G4730 Sleep apnea, unspecified: Secondary | ICD-10-CM | POA: Insufficient documentation

## 2016-06-14 DIAGNOSIS — N400 Enlarged prostate without lower urinary tract symptoms: Secondary | ICD-10-CM | POA: Insufficient documentation

## 2016-06-14 DIAGNOSIS — I2510 Atherosclerotic heart disease of native coronary artery without angina pectoris: Secondary | ICD-10-CM | POA: Diagnosis not present

## 2016-06-14 DIAGNOSIS — Z79899 Other long term (current) drug therapy: Secondary | ICD-10-CM | POA: Diagnosis not present

## 2016-06-14 DIAGNOSIS — K3189 Other diseases of stomach and duodenum: Secondary | ICD-10-CM | POA: Diagnosis not present

## 2016-06-14 DIAGNOSIS — K2980 Duodenitis without bleeding: Secondary | ICD-10-CM | POA: Insufficient documentation

## 2016-06-14 HISTORY — PX: ESOPHAGOGASTRODUODENOSCOPY (EGD) WITH PROPOFOL: SHX5813

## 2016-06-14 SURGERY — ESOPHAGOGASTRODUODENOSCOPY (EGD) WITH PROPOFOL
Anesthesia: General

## 2016-06-14 MED ORDER — SODIUM CHLORIDE 0.9 % IV SOLN
INTRAVENOUS | Status: DC
Start: 2016-06-14 — End: 2016-06-14
  Administered 2016-06-14: 1000 mL via INTRAVENOUS

## 2016-06-14 MED ORDER — PROPOFOL 10 MG/ML IV BOLUS
INTRAVENOUS | Status: DC | PRN
  Administered 2016-06-14: 50 mg via INTRAVENOUS

## 2016-06-14 MED ORDER — LIDOCAINE HCL (CARDIAC) 20 MG/ML IV SOLN
INTRAVENOUS | Status: DC | PRN
  Administered 2016-06-14: 60 mg via INTRAVENOUS

## 2016-06-14 MED ORDER — PROPOFOL 500 MG/50ML IV EMUL
INTRAVENOUS | Status: DC | PRN
  Administered 2016-06-14: 150 ug/kg/min via INTRAVENOUS

## 2016-06-14 MED ORDER — EPHEDRINE SULFATE 50 MG/ML IJ SOLN
INTRAMUSCULAR | Status: DC | PRN
  Administered 2016-06-14: 10 mg via INTRAVENOUS

## 2016-06-14 MED ORDER — SODIUM CHLORIDE 0.9 % IV SOLN: INTRAVENOUS | Status: DC

## 2016-06-14 MED ORDER — GLYCOPYRROLATE 0.2 MG/ML IJ SOLN
INTRAMUSCULAR | Status: DC | PRN
  Administered 2016-06-14: 0.2 mg via INTRAVENOUS

## 2016-06-14 MED ORDER — MIDAZOLAM HCL 2 MG/2ML IJ SOLN
INTRAMUSCULAR | Status: DC | PRN
  Administered 2016-06-14: 2 mg via INTRAVENOUS

## 2016-06-14 NOTE — Op Note (Signed)
[facility] Gastroenterology Patient Name: Corey Perry Procedure Date: 06/14/2016 10:51 AM MRN: [id number] Account #: [id number] Date of Birth: 1945/12/21 Admit Type: Outpatient Age: 70 Room: [facility] ENDO ROOM 4 Gender: Male Note Status: Finalized Procedure:            Upper GI endoscopy Indications:          Dysphagia Providers:            [name], MD Referring MD:         [name], MD (Referring MD) Medicines:            Monitored Anesthesia Care Complications:        No immediate complications. Procedure:            Pre-Anesthesia Assessment:                       - ASA Grade Assessment: III - A patient with severe                        systemic disease.                       After obtaining informed consent, the endoscope was                        passed under direct vision. Throughout the procedure,                        the patient's blood pressure, pulse, and oxygen                        saturations were monitored continuously. The Endoscope                        was introduced through the mouth, and advanced to the                        third part of duodenum. The upper GI endoscopy was                        performed with moderate difficulty due to narrowing.                        Successful completion of the procedure was aided by                        withdrawing the scope and replacing with the pediatric                        endoscope. The patient tolerated the procedure well.                        The Endoscope was introduced through the mouth, and                        advanced to the third part of duodenum. Findings:      The endoscope was advanced to the distal esophagus, where a       narrowing/stenosis was found. This could not be passed by a standard       upper endoscope and  I switched to a pediatric gastroscope. One severe       stenosis was found. This measured 3-4 mm (inner diameter) x 3 cm (in   length) and was traversed after downsizing scope. There is the       appearance of a mass in the wall of the esophagus in the distal segment       above the GE junction. Biopsies were taken with a cold forceps for       histology.      Patchy mildly erythematous mucosa without bleeding was found in the       gastric antrum. Biopsies were taken with a cold forceps for histology.       Biopsies were taken with a cold forceps for Helicobacter pylori testing.      The cardia and gastric fundus were normal on retroflexion. There is no       evidence of lesion in the cardia.      Patchy mild inflammation characterized by congestion (edema) and       erythema was found in the duodenal bulb. Impression:           - Esophageal stenosis. Biopsied.                       - Erythematous mucosa in the antrum. Biopsied.                       - Duodenitis. Recommendation:       - Await pathology results.                       - Perform an upper endoscopic ultrasound (UEUS) at                        appointment to be scheduled. Procedure Code(s):    --- Professional ---                       [phone], Esophagogastroduodenoscopy, flexible, transoral;                        with biopsy, single or multiple Diagnosis Code(s):    --- Professional ---                       K22.2, Esophageal obstruction                       K31.89, Other diseases of stomach and duodenum                       K29.80, Duodenitis without bleeding                       R13.10, Dysphagia, unspecified CPT copyright 2016 American Medical Association. All rights reserved. The codes documented in this report are preliminary and upon coder review may  be revised to meet current compliance requirements. [name], MD 06/14/2016 11:47:27 AM This report has been signed electronically. Number of Addenda: 0 Note Initiated On: 06/14/2016 10:51 AM      [facility]

## 2016-06-14 NOTE — Telephone Encounter (Signed)
  Oncology Nurse Navigator Documentation Notified Faythe Dingwall at Dr Marton Redwood office that EUS has been scheduled at [facility] 10/3 with Dr Cephas Darby. Navigator Location: [facility] (06/14/16 1500) Navigator Encounter Type: Telephone (06/14/16 1500) Telephone: Outgoing Call;Appt Confirmation/Clarification (06/14/16 1500)                     Coordination of Care: EUS (06/14/16 1500)                  Time Spent with Patient: 15 (06/14/16 1500)

## 2016-06-14 NOTE — Anesthesia Preprocedure Evaluation (Signed)
Anesthesia Evaluation  Patient identified by MRN, date of birth, ID band Patient awake    Reviewed: Allergy & Precautions, H&P , NPO status , Patient's Chart, lab work & pertinent test results, reviewed documented beta blocker date and time   History of Anesthesia Complications Negative for: history of anesthetic complications  Airway Mallampati: III  TM Distance: >3 FB Neck ROM: full    Dental no notable dental hx. (+) Teeth Intact, Caps, Missing   Pulmonary shortness of breath and with exertion, sleep apnea , COPD (likely), neg recent URI, former smoker,    Pulmonary exam normal breath sounds clear to auscultation       Cardiovascular Exercise Tolerance: Good hypertension, On Medications + angina (much improved since recent stent placcement) + CAD, + Past MI and + Cardiac Stents  (-) CABG Normal cardiovascular exam+ dysrhythmias + Valvular Problems/Murmurs  Rhythm:regular Rate:Normal     Neuro/Psych negative neurological ROS  negative psych ROS   GI/Hepatic Neg liver ROS, GERD  ,  Endo/Other  neg diabetesHypothyroidism   Renal/GU CRFRenal disease  negative genitourinary   Musculoskeletal   Abdominal   Peds  Hematology negative hematology ROS (+)   Anesthesia Other Findings Past Medical History: No date: Abnormal white blood cell (WBC) count     Comment: seeing Dr. [name] at [facility] [place] on 10/25 No date: BPH (benign prostatic hypertrophy) No date: Bradycardia No date: CAD (coronary artery disease) No date: Chronic kidney disease No date: Dysrhythmia     Comment: bradycardia - sees Dr. Humphrey Rolls No date: GERD (gastroesophageal reflux disease) No date: Hyperlipidemia No date: Hypertension No date: Hypothyroidism No date: Sleep apnea     Comment: mild - SS - Care everywhere -04/18/07 No date: Spinal stenosis     Comment: lumbar   Reproductive/Obstetrics negative OB ROS                              Anesthesia Physical Anesthesia Plan  ASA: III  Anesthesia Plan: General   Post-op Pain Management:    Induction:   Airway Management Planned:   Additional Equipment:   Intra-op Plan:   Post-operative Plan:   Informed Consent: I have reviewed the patients History and Physical, chart, labs and discussed the procedure including the risks, benefits and alternatives for the proposed anesthesia with the patient or authorized representative who has indicated his/her understanding and acceptance.   Dental Advisory Given  Plan Discussed with: Anesthesiologist, CRNA and Surgeon  Anesthesia Plan Comments:         Anesthesia Quick Evaluation

## 2016-06-14 NOTE — Anesthesia Postprocedure Evaluation (Signed)
Anesthesia Post Note  Patient: FAYETTE HAMADA  Procedure(s) Performed: Procedure(s) (LRB): ESOPHAGOGASTRODUODENOSCOPY (EGD) WITH PROPOFOL (N/A)  Patient location during evaluation: Endoscopy Anesthesia Type: General Level of consciousness: awake and alert Pain management: pain level controlled Vital Signs Assessment: post-procedure vital signs reviewed and stable Respiratory status: spontaneous breathing, nonlabored ventilation and respiratory function stable Cardiovascular status: blood pressure returned to baseline and stable Postop Assessment: no signs of nausea or vomiting Anesthetic complications: no    Last Vitals:  Vitals:   06/14/16 1200 06/14/16 1210  BP: 120/75 124/89  Pulse: 65 62  Resp: 13 17  Temp:      Last Pain:  Vitals:   06/14/16 1130  TempSrc: Tympanic                 Martha Clan

## 2016-06-14 NOTE — Telephone Encounter (Signed)
  Oncology Nurse Navigator Documentation Received call from [place] at Dr Marton Redwood office. Endo was performed today on this patient and an esophageal mass was found. He is on Brilinta and Dr Gustavo Lah has instructed him to continue to hold. Records sent to [facility] for scheduling. They can typically perform within one week. Will notify Dr Gustavo Lah when date is confirmed. Navigator Location: [facility] Onc (06/14/16 1400) Navigator Encounter Type: Telephone (06/14/16 1400) Telephone: Incoming Call (06/14/16 1400)             Barriers/Navigation Needs: Coordination of Care (06/14/16 1400)   Interventions: Coordination of Care (06/14/16 1400)   Coordination of Care: EUS (06/14/16 1400)        Acuity: Level 2 (06/14/16 1400)   Acuity Level 2: Initial guidance, education and coordination as needed;Assistance expediting appointments;Ongoing guidance and education throughout treatment as needed (06/14/16 1400)     Time Spent with Patient: 45 (06/14/16 1400)

## 2016-06-14 NOTE — Transfer of Care (Signed)
Immediate Anesthesia Transfer of Care Note  Patient: Corey Perry  Procedure(s) Performed: Procedure(s): ESOPHAGOGASTRODUODENOSCOPY (EGD) WITH PROPOFOL (N/A)  Patient Location: Endoscopy Unit  Anesthesia Type:General  Level of Consciousness: awake, alert , oriented and patient cooperative  Airway & Oxygen Therapy: Patient Spontanous Breathing and Patient connected to nasal cannula oxygen  Post-op Assessment: Report given to RN, Post -op Vital signs reviewed and stable and Patient moving all extremities X 4  Post vital signs: Reviewed and stable  Last Vitals:  Vitals:   06/14/16 1015 06/14/16 1130  BP: (!) 154/81 126/76  Pulse: (!) 42 60  Temp: 36.1 C (!) 35.7 C    Last Pain:  Vitals:   06/14/16 1130  TempSrc: Tympanic         Complications: No apparent anesthesia complications

## 2016-06-14 NOTE — H&P (Signed)
Outpatient short stay form Pre-procedure 06/14/2016 11:00 AM Corey Sails MD  Primary Physician: Dr. Jeananne Rama  Reason for visit:  EGD  History of present illness:  Patient is a 70 year old male presenting today as above. He has a history of dysphagia that has been less of an issue up until about 2 months ago. It is progressively gotten worse since then. He is regurgitating foods. He has been on a proton pump inhibitor for a number of years. He has long-term issues with reflux.  Patient does have a history of cardiac stents and takes Brilinta as well as aspirin for dual antiplatelet treatment however he is discontinued both of those for about 6 days.  Current Facility-Administered Medications:  .  0.9 %  sodium chloride infusion, , Intravenous, Continuous, Corey Sails, MD, Last Rate: 20 mL/hr at 06/14/16 1032, 1,000 mL at 06/14/16 1032 .  0.9 %  sodium chloride infusion, , Intravenous, Continuous, Corey Sails, MD  Facility-Administered Medications Ordered in Other Encounters:  .  glycopyrrolate (ROBINUL) injection, , Intravenous, Anesthesia Intra-op, Silvana Newness, CRNA, 0.2 mg at 06/14/16 1100  Prescriptions Prior to Admission  Medication Sig Dispense Refill Last Dose  . isosorbide mononitrate (IMDUR) 30 MG 24 hr tablet 30 mg daily. AM  5 06/14/2016 at 0800  . levothyroxine (SYNTHROID, LEVOTHROID) 50 MCG tablet TAKE 1 TABLET BY MOUTH EVERY DAY 90 tablet 3 06/14/2016 at 0800  . lisinopril (PRINIVIL,ZESTRIL) 10 MG tablet Take 1 tablet (10 mg total) by mouth daily. 90 tablet 4 06/14/2016 at 0800  . pantoprazole (PROTONIX) 20 MG tablet Take 1 tablet (20 mg total) by mouth daily. 90 tablet 4 06/14/2016 at 0800  . simvastatin (ZOCOR) 40 MG tablet Take 1 tablet (40 mg total) by mouth daily. 90 tablet 4 06/14/2016 at 0800  . Ascorbic Acid (VITAMIN C) 1000 MG tablet Take 1,000 mg by mouth daily.   Not Taking  . aspirin 81 MG tablet Take 81 mg by mouth daily. AM   Taking  .  multivitamin-iron-minerals-folic acid (CENTRUM) chewable tablet Chew 1 tablet by mouth daily.   Taking  . nitroGLYCERIN (NITROSTAT) 0.4 MG SL tablet Place 1 tablet under the tongue every 5 (five) minutes as needed.  98 Taking  . ticagrelor (BRILINTA) 90 MG TABS tablet Take 1 tablet (90 mg total) by mouth 2 (two) times daily. 180 tablet 3 Taking  . vitamin E (VITAMIN E) 400 UNIT capsule Take 400 Units by mouth daily.   Taking     No Known Allergies   Past Medical History:  Diagnosis Date  . Abnormal white blood cell (WBC) count    seeing Dr. [name] at [place] on 10/25  . BPH (benign prostatic hypertrophy)   . Bradycardia   . CAD (coronary artery disease)   . Chronic kidney disease   . Dysrhythmia    bradycardia - sees Dr. Humphrey Rolls  . GERD (gastroesophageal reflux disease)   . Hyperlipidemia   . Hypertension   . Hypothyroidism   . Sleep apnea    mild - SS - Care everywhere -04/18/07  . Spinal stenosis    lumbar    Review of systems:      Physical Exam    Heart and lungs: Regular rate and rhythm without rub or gallop, lungs are bilaterally clear.    HEENT: Normal cephalic atraumatic eyes are anicteric    Other:     Pertinant exam for procedure: Soft nontender nondistended bowel sounds positive normoactive    Planned proceedures:  EGD and indicated procedures. I have discussed the risks benefits and complications of procedures to include not limited to bleeding, infection, perforation and the risk of sedation and the patient wishes to proceed.    Corey Sails, MD Gastroenterology 06/14/2016  11:00 AM

## 2016-06-17 ENCOUNTER — Encounter: Payer: Self-pay | Admitting: Gastroenterology

## 2016-06-18 ENCOUNTER — Encounter: Payer: Medicare Other | Admitting: Family Medicine

## 2016-06-18 LAB — SURGICAL PATHOLOGY

## 2016-06-20 ENCOUNTER — Other Ambulatory Visit: Payer: Self-pay | Admitting: Gastroenterology

## 2016-06-20 ENCOUNTER — Telehealth: Payer: Self-pay

## 2016-06-20 DIAGNOSIS — C159 Malignant neoplasm of esophagus, unspecified: Secondary | ICD-10-CM

## 2016-06-20 NOTE — Telephone Encounter (Signed)
  Oncology Nurse Navigator Documentation Received call from [place] at [facility]. They are seeing patient at this time. Appt with Dr [name] confirmed with her 10/10 at 10:45  Navigator Location: [facility] (06/20/16 0900) Navigator Encounter Type: Telephone (06/20/16 0900) Telephone: Incoming Call;Appt Confirmation/Clarification (06/20/16 0900)                                        Time Spent with Patient: 15 (06/20/16 0900)

## 2016-06-22 ENCOUNTER — Emergency Department
Admission: EM | Admit: 2016-06-22 | Discharge: 2016-06-22 | Disposition: A | Discharge status: Home / Self Care | Payer: Medicare Other | Attending: Emergency Medicine | Admitting: Emergency Medicine

## 2016-06-22 ENCOUNTER — Encounter: Payer: Self-pay | Admitting: *Deleted

## 2016-06-22 ENCOUNTER — Emergency Department: Payer: Medicare Other

## 2016-06-22 DIAGNOSIS — K5901 Slow transit constipation: Secondary | ICD-10-CM

## 2016-06-22 DIAGNOSIS — Z87891 Personal history of nicotine dependence: Secondary | ICD-10-CM | POA: Diagnosis not present

## 2016-06-22 DIAGNOSIS — I2510 Atherosclerotic heart disease of native coronary artery without angina pectoris: Secondary | ICD-10-CM | POA: Diagnosis not present

## 2016-06-22 DIAGNOSIS — Z79899 Other long term (current) drug therapy: Secondary | ICD-10-CM | POA: Diagnosis not present

## 2016-06-22 DIAGNOSIS — N183 Chronic kidney disease, stage 3 (moderate): Secondary | ICD-10-CM | POA: Insufficient documentation

## 2016-06-22 DIAGNOSIS — I129 Hypertensive chronic kidney disease with stage 1 through stage 4 chronic kidney disease, or unspecified chronic kidney disease: Secondary | ICD-10-CM | POA: Insufficient documentation

## 2016-06-22 DIAGNOSIS — K648 Other hemorrhoids: Secondary | ICD-10-CM

## 2016-06-22 DIAGNOSIS — Z7982 Long term (current) use of aspirin: Secondary | ICD-10-CM | POA: Diagnosis not present

## 2016-06-22 DIAGNOSIS — Z8521 Personal history of malignant neoplasm of larynx: Secondary | ICD-10-CM | POA: Insufficient documentation

## 2016-06-22 DIAGNOSIS — E039 Hypothyroidism, unspecified: Secondary | ICD-10-CM | POA: Insufficient documentation

## 2016-06-22 DIAGNOSIS — K5900 Constipation, unspecified: Secondary | ICD-10-CM | POA: Diagnosis present

## 2016-06-22 MED ORDER — LIDOCAINE HCL 2 % EX GEL
CUTANEOUS | Status: AC
  Administered 2016-06-22: 1
  Filled 2016-06-22: qty 10

## 2016-06-22 MED ORDER — GLYCERIN (LAXATIVE) 2.1 G RE SUPP
RECTAL | Status: AC
  Administered 2016-06-22: 1 via RECTAL
  Filled 2016-06-22: qty 1

## 2016-06-22 MED ORDER — GLYCERIN (LAXATIVE) 2.1 G RE SUPP
1.0000 | Freq: Once | RECTAL | Status: AC
  Administered 2016-06-22: 1 via RECTAL

## 2016-06-22 MED ORDER — DOCUSATE SODIUM 60 MG/15ML PO SYRP: 60.0000 mg | ORAL_SOLUTION | Freq: Every day | ORAL | 0 refills | Status: DC

## 2016-06-22 MED ORDER — LIDOCAINE HCL 2 % EX GEL
1.0000 "application " | Freq: Once | CUTANEOUS | Status: AC
  Administered 2016-06-22: 1

## 2016-06-22 MED ORDER — HYDROCORTISONE 2.5 % RE CREA: TOPICAL_CREAM | RECTAL | 1 refills | Status: DC

## 2016-06-22 NOTE — ED Provider Notes (Signed)
[facility] Emergency Department Provider Note   ____________________________________________   First MD Initiated Contact with Patient 06/22/16 2040     (approximate)  I have reviewed the triage vital signs and the nursing notes.   HISTORY  Chief Complaint Hemorrhoids    HPI Corey Perry is a 70 y.o. male patient complaining of hemorrhoidal pain for 3 days. Patient state  start having constipation after 2 endoscopic exams. Patient state he was diagnosed with cancer of the throat. Patient state he is taking some over-the-counter syrup which is giving him some relief with his bowel movements. Patient is rating his pain as a 10 over 10. Patient state his doctor advised soft food diet to avoid  discomfort in his throat No other palliative measures for this complaint.   Past Medical History:  Diagnosis Date  . Abnormal white blood cell (WBC) count    seeing Dr. [name] at [place] on 10/25  . BPH (benign prostatic hypertrophy)   . Bradycardia   . CAD (coronary artery disease)   . Chronic kidney disease   . Dysrhythmia    bradycardia - sees Dr. Humphrey Rolls  . GERD (gastroesophageal reflux disease)   . Hyperlipidemia   . Hypertension   . Hypothyroidism   . Sleep apnea    mild - SS - Care everywhere -04/18/07  . Spinal stenosis    lumbar    Patient Active Problem List   Diagnosis Date Noted  . Leukopenia 04/10/2016  . Thrombocytopenia ([place]) 04/10/2016  . Unstable angina ([place]) 03/12/2016  . Special screening for malignant neoplasms, colon   . Benign neoplasm of ascending colon   . Rectal polyp   . Hypertensive kidney disease with CKD stage III 06/22/2015  . CAD (coronary artery disease) 06/21/2015  . BPH (benign prostatic hyperplasia) 06/21/2015  . Acid reflux 06/21/2015  . Hypothyroid   . Hyperlipidemia   . Hypertension     Past Surgical History:  Procedure Laterality Date  . BACK SURGERY    . CARDIAC CATHETERIZATION Left 03/12/2016   Procedure: Left Heart Cath and Coronary Angiography;  Surgeon: [name], MD;  Location: [facility];  Service: Cardiovascular;  Laterality: Left;  . CARDIAC CATHETERIZATION N/A 03/12/2016   Procedure: Coronary Stent Intervention;  Surgeon: Yolonda Kida, MD;  Location: [facility];  Service: Cardiovascular;  Laterality: N/A;  . COLONOSCOPY WITH PROPOFOL N/A 07/13/2015   Procedure: COLONOSCOPY WITH PROPOFOL;  Surgeon: Lucilla Lame, MD;  Location: [facility];  Service: Endoscopy;  Laterality: N/A;  . CORONARY ANGIOPLASTY  10/05 and 12/05   4 DES placed  . ESOPHAGOGASTRODUODENOSCOPY (EGD) WITH PROPOFOL N/A 06/14/2016   Procedure: ESOPHAGOGASTRODUODENOSCOPY (EGD) WITH PROPOFOL;  Surgeon: [name], MD;  Location[facility] ENDOSCOPY;  Service: Endoscopy;  Laterality: N/A;  . HERNIA REPAIR     x3  . POLYPECTOMY  07/13/2015   Procedure: POLYPECTOMY;  Surgeon: Lucilla Lame, MD;  Location: [facility];  Service: Endoscopy;;    Prior to Admission medications   Medication Sig Start Date End Date Taking? Authorizing Provider  Ascorbic Acid (VITAMIN C) 1000 MG tablet Take 1,000 mg by mouth daily.    Historical Provider, MD  aspirin 81 MG tablet Take 81 mg by mouth daily. AM    Historical Provider, MD  docusate (COLACE) 60 MG/15ML syrup Take 15 mLs (60 mg total) by mouth daily. 06/22/16   Sable Feil, PA-C  hydrocortisone (ANUSOL-HC) 2.5 % rectal cream Apply rectally 2  times daily 06/22/16 06/22/17  Sable Feil, PA-C  isosorbide mononitrate (IMDUR) 30 MG 24 hr tablet 30 mg daily. AM 06/12/15   Historical Provider, MD  levothyroxine (SYNTHROID, LEVOTHROID) 50 MCG tablet TAKE 1 TABLET BY MOUTH EVERY DAY 08/07/15   [name], MD  lisinopril (PRINIVIL,ZESTRIL) 10 MG tablet Take 1 tablet (10 mg total) by mouth daily. 06/21/15   [name], MD  multivitamin-iron-minerals-folic acid (CENTRUM) chewable tablet Chew 1 tablet by mouth daily.    Historical  Provider, MD  nitroGLYCERIN (NITROSTAT) 0.4 MG SL tablet Place 1 tablet under the tongue every 5 (five) minutes as needed. 03/08/16   Historical Provider, MD  pantoprazole (PROTONIX) 20 MG tablet Take 1 tablet (20 mg total) by mouth daily. 06/21/15   [name], MD  simvastatin (ZOCOR) 40 MG tablet Take 1 tablet (40 mg total) by mouth daily. 06/21/15   [name], MD  ticagrelor (BRILINTA) 90 MG TABS tablet Take 1 tablet (90 mg total) by mouth 2 (two) times daily. 03/13/16   Daune Perch, NP  vitamin E (VITAMIN E) 400 UNIT capsule Take 400 Units by mouth daily.    Historical Provider, MD    Allergies Review of patient's allergies indicates no known allergies.  Family History  Problem Relation Age of Onset  . Hypertension Mother   . Heart disease Father   . Hypertension Father   . Kidney disease Father   . Hypertension Brother   . Diabetes Son     Social History Social History  Substance Use Topics  . Smoking status: Former Smoker    Packs/day: 1.00    Years: 40.00    Types: Cigarettes    Quit date: 07/16/2004  . Smokeless tobacco: Former Systems developer    Types: Chew  . Alcohol use No    Review of Systems Constitutional: No fever/chills Eyes: No visual changes. ENT: No sore throat. Cardiovascular: Denies chest pain. Respiratory: Denies shortness of breath. Gastrointestinal: No abdominal pain.  No nausea, no vomiting.  No diarrhea.  No constipation. Genitourinary: Negative for dysuria. Musculoskeletal: Negative for back pain. Skin: Negative for rash. Neurological: Negative for headaches, focal weakness or numbness.    ____________________________________________   PHYSICAL EXAM:  VITAL SIGNS: ED Triage Vitals   Enc Vitals Group     BP (!) 158/87     Pulse Rate (!) 54     Resp 18     Temp 98.3 F (36.8 C)     Temp Source Oral     SpO2 98 %     Weight 210 lb (95.3 kg)     Height '5\' 9"'$  (1.753 m)     Head Circumference      Peak Flow      Pain Score 10      Pain Loc      Pain Edu?      Excl. in [place]?     Constitutional: Alert and oriented. Well appearing and in no acute distress. Eyes: Conjunctivae are normal. PERRL. EOMI. Head: Atraumatic. Nose: No congestion/rhinnorhea. Mouth/Throat: Mucous membranes are moist.  Oropharynx non-erythematous. Neck: No stridor. No cervical spine tenderness to palpation. Hematological/Lymphatic/Immunilogical: No cervical lymphadenopathy. Cardiovascular: Normal rate, regular rhythm. Grossly normal heart sounds.  Good peripheral circulation. Respiratory: Normal respiratory effort.  No retractions. Lungs CTAB. Gastrointestinal: Soft and nontender. No distention. No abdominal bruits. No CVA tenderness.Palpable internal hemorrhoid but guaiac negative. Musculoskeletal: No lower extremity tenderness nor edema.  No joint effusions. Neurologic:  Normal speech and language.  No gross focal neurologic deficits are appreciated. No gait instability. Skin:  Skin is warm, dry and intact. No rash noted. Psychiatric: Mood and affect are normal. Speech and behavior are normal.  ____________________________________________   LABS (all labs ordered are listed, but only abnormal results are displayed)  Labs Reviewed - No data to display ____________________________________________  EKG   ____________________________________________  RADIOLOGY  K he will be reveals no obstruction. ____________________________________________   PROCEDURES  Procedure(s) performed: None  Procedures  Critical Care performed: No  ____________________________________________   INITIAL IMPRESSION / ASSESSMENT AND PLAN / ED COURSE  Pertinent labs & imaging results that were available during my care of the patient were reviewed by me and considered in my medical decision making (see chart for details).  Hemorrhoid with mild constipation. Discussed x-ray finding with patient. Patient given discharge care instructions. Patient advised  follow-up family doctor for continued care. Patient get a prescription for Colace and Anusol suppositories.  Clinical Course   Patient had a glycerin suppository and in discussed lidocaine inserted rectally.  ____________________________________________   FINAL CLINICAL IMPRESSION(S) / ED DIAGNOSES  Final diagnoses:  Internal hemorrhoid  Constipation by delayed colonic transit      NEW MEDICATIONS STARTED DURING THIS VISIT:  New Prescriptions   DOCUSATE (COLACE) 60 MG/15ML SYRUP    Take 15 mLs (60 mg total) by mouth daily.   HYDROCORTISONE (ANUSOL-HC) 2.5 % RECTAL CREAM    Apply rectally 2 times daily     Note:  This document was prepared using Dragon voice recognition software and may include unintentional dictation errors.    Sable Feil, PA-C 06/22/16 [place], MD 06/22/16 [phone]

## 2016-06-22 NOTE — ED Triage Notes (Addendum)
States hemorrhoid pain, states he has recently been constipated after 2 "scopes", states he took kairo syrup at home with relief but states when he has to have a BM it burns

## 2016-06-23 DIAGNOSIS — C159 Malignant neoplasm of esophagus, unspecified: Secondary | ICD-10-CM | POA: Insufficient documentation

## 2016-06-23 NOTE — Progress Notes (Signed)
[place]  [phone] [phone]  ID: [name] OB: 04-05-1946  MR#: [id number]  QIH#:[id number]  Patient Care Team: [name], MD as PCP - General (Family Medicine) [name], MD as PCP - Family Medicine (Family Medicine) [name], MD (Specialist) [name], MD as Referring Physician (Internal Medicine) [name], MD as Consulting Physician (Neurosurgery)  CHIEF COMPLAINT: Stage IIb adenocarcinoma of the lower third of the esophagus.  INTERVAL HISTORY: Patient returns to clinic today for further evaluation and discussion of his newly diagnosed adenocarcinoma the esophagus. He was previously seen in clinic for thrombocytopenia and leukopenia. Over the past 1-2 months patient has noted progressive difficulty swallowing accompanied with regurgitation of food. He even states he has occasional difficulty swallowing water. Despite this, his weight has remained relatively stable. He is anxious today, but otherwise feels well. He denies any recent fevers or illnesses. He has no neurologic complaints. He denies any chest pain or shortness of breath. He denies any constipation or diarrhea. He has no urinary complaints. Patient offers no further specific complaints today.  REVIEW OF SYSTEMS:   Review of Systems  Constitutional: Negative.  Negative for fever, malaise/fatigue and weight loss.  HENT: Negative.   Respiratory: Negative.  Negative for cough and shortness of breath.   Cardiovascular: Negative.  Negative for chest pain and leg swelling.  Gastrointestinal: Positive for vomiting. Negative for abdominal pain, blood in stool, melena and nausea.  Genitourinary: Negative.   Musculoskeletal: Negative.   Neurological: Negative.  Negative for weakness.  Psychiatric/Behavioral: The patient is nervous/anxious.     As per HPI. Otherwise, a complete review of systems is negative.  PAST MEDICAL HISTORY: Past Medical History:    Diagnosis Date  . Abnormal white blood cell (WBC) count    seeing Dr. [name] at [facility] on 10/25  . BPH (benign prostatic hypertrophy)   . Bradycardia   . CAD (coronary artery disease)   . Chronic kidney disease   . Dysrhythmia    bradycardia - sees Dr. Humphrey Rolls  . Esophageal cancer ([facility]) 05/2016  . GERD (gastroesophageal reflux disease)   . Hyperlipidemia   . Hypertension   . Hypothyroidism   . Sleep apnea    mild - SS - Care everywhere -04/18/07  . Spinal stenosis    lumbar    PAST SURGICAL HISTORY: Past Surgical History:  Procedure Laterality Date  . BACK SURGERY    . CARDIAC CATHETERIZATION Left 03/12/2016   Procedure: Left Heart Cath and Coronary Angiography;  Surgeon: [name], MD;  Location: [facility];  Service: Cardiovascular;  Laterality: Left;  . CARDIAC CATHETERIZATION N/A 03/12/2016   Procedure: Coronary Stent Intervention;  Surgeon: Yolonda Kida, MD;  Location: [facility];  Service: Cardiovascular;  Laterality: N/A;  . COLONOSCOPY WITH PROPOFOL N/A 07/13/2015   Procedure: COLONOSCOPY WITH PROPOFOL;  Surgeon: Lucilla Lame, MD;  Location: [facility];  Service: Endoscopy;  Laterality: N/A;  . CORONARY ANGIOPLASTY  10/05 and 12/05   4 DES placed  . ESOPHAGOGASTRODUODENOSCOPY (EGD) WITH PROPOFOL N/A 06/14/2016   Procedure: ESOPHAGOGASTRODUODENOSCOPY (EGD) WITH PROPOFOL;  Surgeon: [name], MD;  Location: [facility];  Service: Endoscopy;  Laterality: N/A;  . HERNIA REPAIR     x3  . POLYPECTOMY  07/13/2015   Procedure: POLYPECTOMY;  Surgeon: Lucilla Lame, MD;  Location: [facility];  Service: Endoscopy;;    FAMILY HISTORY Family History  Problem Relation Age of Onset  .  Hypertension Mother   . Heart disease Father   . Hypertension Father   . Kidney disease Father   . Hypertension Brother   . Diabetes Son        ADVANCED DIRECTIVES:    HEALTH MAINTENANCE: Social History  Substance Use Topics  .  Smoking status: Former Smoker    Packs/day: 1.00    Years: 40.00    Types: Cigarettes    Quit date: 07/16/2004  . Smokeless tobacco: Former Systems developer    Types: Chew  . Alcohol use No     Colonoscopy:  PAP:  Bone density:  Lipid panel:  No Known Allergies  Current Outpatient Prescriptions  Medication Sig Dispense Refill  . Ascorbic Acid (VITAMIN C) 1000 MG tablet Take 1,000 mg by mouth daily.    Marland Kitchen aspirin 81 MG tablet Take 81 mg by mouth daily. AM    . docusate (COLACE) 60 MG/15ML syrup Take 15 mLs (60 mg total) by mouth daily. 200 mL 0  . hydrocortisone (ANUSOL-HC) 2.5 % rectal cream Apply rectally 2 times daily 30 g 1  . isosorbide mononitrate (IMDUR) 30 MG 24 hr tablet 30 mg daily. AM  5  . levothyroxine (SYNTHROID, LEVOTHROID) 50 MCG tablet Take 1 tablet (50 mcg total) by mouth daily. 90 tablet 4  . lisinopril (PRINIVIL,ZESTRIL) 10 MG tablet Take 1 tablet (10 mg total) by mouth daily. 90 tablet 4  . multivitamin-iron-minerals-folic acid (CENTRUM) chewable tablet Chew 1 tablet by mouth daily.    . nitroGLYCERIN (NITROSTAT) 0.4 MG SL tablet Place 1 tablet under the tongue every 5 (five) minutes as needed.  98  . pantoprazole (PROTONIX) 20 MG tablet Take 1 tablet (20 mg total) by mouth daily. 90 tablet 4  . simvastatin (ZOCOR) 40 MG tablet Take 1 tablet (40 mg total) by mouth daily. 90 tablet 4  . ticagrelor (BRILINTA) 90 MG TABS tablet Take 1 tablet by mouth daily.    . vitamin E (VITAMIN E) 400 UNIT capsule Take 400 Units by mouth daily.     No current facility-administered medications for this visit.     OBJECTIVE: Vitals:   06/25/16 1058  BP: 126/64  Pulse: (!) 50  Resp: 18  Temp: 97.6 F (36.4 C)     Body mass index is 29.68 kg/m.    ECOG FS:0 - Asymptomatic  General: Well-developed, well-nourished, no acute distress. Eyes: Pink conjunctiva, anicteric sclera. Lungs: Clear to auscultation bilaterally. Heart: Regular rate and rhythm. No rubs, murmurs, or  gallops. Abdomen: Soft, nontender, nondistended. No organomegaly noted, normoactive bowel sounds. Musculoskeletal: No edema, cyanosis, or clubbing. Neuro: Alert, answering all questions appropriately. Cranial nerves grossly intact. Skin: No rashes or petechiae noted. Psych: Normal affect.   LAB RESULTS:  Lab Results  Component Value Date   NA 133 (L) 06/25/2016   K 4.5 06/25/2016   CL 99 (L) 06/25/2016   CO2 25 06/25/2016   GLUCOSE 104 (H) 06/25/2016   BUN 19 06/25/2016   CREATININE 1.43 (H) 06/25/2016   CALCIUM 9.3 06/25/2016   PROT 7.6 06/25/2016   ALBUMIN 4.6 06/25/2016   AST 23 06/25/2016   ALT 23 06/25/2016   ALKPHOS 47 06/25/2016   BILITOT 0.8 06/25/2016   GFRNONAA 48 (L) 06/25/2016   GFRAA 56 (L) 06/25/2016    Lab Results  Component Value Date   WBC 3.2 (L) 06/25/2016   NEUTROABS 2.0 06/25/2016   HGB 13.5 06/25/2016   HCT 39.6 (L) 06/25/2016   MCV 100.0 06/25/2016   PLT  112 (L) 06/25/2016     STUDIES: Dg Abdomen 1 View  Result Date: 06/22/2016 CLINICAL DATA:  Burning sensation while trying to have a bowel movement. Recent large bloody stool. EXAM: ABDOMEN - 1 VIEW COMPARISON:  CT scan March 10, 2009 FINDINGS: High attenuation in the pelvis is likely contrast from previous CT imaging within colonic diverticuli. There are probably a few diverticula in the right colon as well. No free air, portal venous gas, or pneumatosis. No bowel obstruction. The lung bases are normal. IMPRESSION: Contrast within colonic diverticuli. No acute abnormalities are seen. Electronically Signed   By: Dorise Bullion III M.D   On: 06/22/2016 21:35   Ct Chest W Contrast  Result Date: 06/26/2016 CLINICAL DATA:  70 year old male with history of esophageal cancer diagnosed by endoscopy at [facility] 06/18/2016. Initial staging examination. EXAM: CT CHEST, ABDOMEN, AND PELVIS WITH CONTRAST TECHNIQUE: Multidetector CT imaging of the chest, abdomen and pelvis was performed following the standard  protocol during bolus administration of intravenous contrast. CONTRAST:  21m ISOVUE-300 IOPAMIDOL (ISOVUE-300) INJECTION 61% COMPARISON:  CT the abdomen and pelvis 03/10/2009. FINDINGS: CT CHEST FINDINGS Cardiovascular: Heart size is normal. There is no significant pericardial fluid, thickening or pericardial calcification. There is aortic atherosclerosis, as well as atherosclerosis of the great vessels of the mediastinum and the coronary arteries, including calcified atherosclerotic plaque in the left anterior descending, ramus intermedius, left circumflex and right coronary arteries. Coronary artery stents are noted in the left anterior descending and ramus intermedius coronary arteries. Mediastinum/Nodes: Distal esophageal mass at the level of the gastroesophageal junction measures approximately 3.0 x 3.1 x 4.9 cm (axial image 51 of series 2 and sagittal image 114 of series 6). More proximal aspect of the esophagus is otherwise unremarkable. No pathologically enlarged mediastinal or hilar lymph nodes. No axillary lymphadenopathy. Lungs/Pleura: There multiple tiny 2-4 mm pulmonary nodules scattered throughout the lungs bilaterally, nonspecific, but favored to be benign. Most of these are associated with the fissures, and are likely to represent subpleural lymph nodes. No other larger more suspicious appearing pulmonary nodules or masses are noted. There is no acute consolidative airspace disease. No pleural effusions. Musculoskeletal: There are no aggressive appearing lytic or blastic lesions noted in the visualized portions of the skeleton. CT ABDOMEN PELVIS FINDINGS Hepatobiliary: No cystic or solid hepatic lesions. No intra or extrahepatic biliary ductal dilatation. Gallbladder is normal in appearance. Pancreas: No pancreatic mass. No pancreatic ductal dilatation. No pancreatic or peripancreatic fluid or inflammatory changes. Spleen: Unremarkable. Adrenals/Urinary Tract: Sub cm low-attenuation lesion in the  upper pole of the left kidney is too small to definitively characterize, but appears similar in retrospect to prior study 03/10/2009, likely a cyst. Right kidney and bilateral adrenal glands are normal in appearance. There is no hydroureteronephrosis. Urinary bladder is normal in appearance. Stomach/Bowel: The mass in the distal esophagus is at the level of the gastroesophageal junction, and may extend to involve the very proximal aspect of the cardia of the stomach (this is uncertain). More distal aspect of the stomach is otherwise unremarkable in appearance. No pathologic dilatation of small bowel or colon. Numerous colonic diverticulae are noted, particularly in the sigmoid colon, without surrounding inflammatory changes to suggest an acute diverticulitis at this time. Normal appendix. Vascular/Lymphatic: Aortic atherosclerosis, with ectasia of the distal infrarenal abdominal aorta which measures up to 2.8 cm in diameter shortly before the level of the aortic bifurcation. Prominent but nonenlarged 7 mm short axis celiac axis lymph node is noted, but is nonspecific. No  lymphadenopathy noted in the abdomen or pelvis. Reproductive: Prostate gland and seminal vesicles are unremarkable in appearance. Other: Small right inguinal hernia containing only fat incidentally noted. No significant volume of ascites. No pneumoperitoneum. Musculoskeletal: There are no aggressive appearing lytic or blastic lesions noted in the visualized portions of the skeleton. IMPRESSION: 1. Distal esophageal mass measuring approximately 3.0 x 3.1 x 4.9 cm centered at the level of the gastroesophageal junction, with potential extension to involve the most proximal aspect of the cardia. There is no surrounding lymphadenopathy, and no definite evidence of metastatic disease in the chest, abdomen or pelvis. 2. Several tiny 2-4 mm pulmonary nodules scattered throughout the lungs bilaterally are considered highly nonspecific. As most of these are  subpleural in location, these are favored to represent subpleural lymph nodes, however, attention on followup studies is recommended to ensure the stability or resolution of these findings. 3. Aortic atherosclerosis, in addition to three-vessel coronary artery disease. Please note that although the presence of coronary artery calcium documents the presence of coronary artery disease, the severity of this disease and any potential stenosis cannot be assessed on this non-gated CT examination. Assessment for potential risk factor modification, dietary therapy or pharmacologic therapy may be warranted, if clinically indicated. 4. Colonic diverticulosis without evidence of acute diverticulitis at this time. 5. Additional incidental findings, as above. Electronically Signed   By: Vinnie Langton M.D.   On: 06/26/2016 11:33   Ct Abdomen Pelvis W Contrast  Result Date: 06/26/2016 CLINICAL DATA:  70 year old male with history of esophageal cancer diagnosed by endoscopy at [facility] 06/18/2016. Initial staging examination. EXAM: CT CHEST, ABDOMEN, AND PELVIS WITH CONTRAST TECHNIQUE: Multidetector CT imaging of the chest, abdomen and pelvis was performed following the standard protocol during bolus administration of intravenous contrast. CONTRAST:  67m ISOVUE-300 IOPAMIDOL (ISOVUE-300) INJECTION 61% COMPARISON:  CT the abdomen and pelvis 03/10/2009. FINDINGS: CT CHEST FINDINGS Cardiovascular: Heart size is normal. There is no significant pericardial fluid, thickening or pericardial calcification. There is aortic atherosclerosis, as well as atherosclerosis of the great vessels of the mediastinum and the coronary arteries, including calcified atherosclerotic plaque in the left anterior descending, ramus intermedius, left circumflex and right coronary arteries. Coronary artery stents are noted in the left anterior descending and ramus intermedius coronary arteries. Mediastinum/Nodes: Distal esophageal mass at the level of the  gastroesophageal junction measures approximately 3.0 x 3.1 x 4.9 cm (axial image 51 of series 2 and sagittal image 114 of series 6). More proximal aspect of the esophagus is otherwise unremarkable. No pathologically enlarged mediastinal or hilar lymph nodes. No axillary lymphadenopathy. Lungs/Pleura: There multiple tiny 2-4 mm pulmonary nodules scattered throughout the lungs bilaterally, nonspecific, but favored to be benign. Most of these are associated with the fissures, and are likely to represent subpleural lymph nodes. No other larger more suspicious appearing pulmonary nodules or masses are noted. There is no acute consolidative airspace disease. No pleural effusions. Musculoskeletal: There are no aggressive appearing lytic or blastic lesions noted in the visualized portions of the skeleton. CT ABDOMEN PELVIS FINDINGS Hepatobiliary: No cystic or solid hepatic lesions. No intra or extrahepatic biliary ductal dilatation. Gallbladder is normal in appearance. Pancreas: No pancreatic mass. No pancreatic ductal dilatation. No pancreatic or peripancreatic fluid or inflammatory changes. Spleen: Unremarkable. Adrenals/Urinary Tract: Sub cm low-attenuation lesion in the upper pole of the left kidney is too small to definitively characterize, but appears similar in retrospect to prior study 03/10/2009, likely a cyst. Right kidney and bilateral adrenal glands are normal in  appearance. There is no hydroureteronephrosis. Urinary bladder is normal in appearance. Stomach/Bowel: The mass in the distal esophagus is at the level of the gastroesophageal junction, and may extend to involve the very proximal aspect of the cardia of the stomach (this is uncertain). More distal aspect of the stomach is otherwise unremarkable in appearance. No pathologic dilatation of small bowel or colon. Numerous colonic diverticulae are noted, particularly in the sigmoid colon, without surrounding inflammatory changes to suggest an acute  diverticulitis at this time. Normal appendix. Vascular/Lymphatic: Aortic atherosclerosis, with ectasia of the distal infrarenal abdominal aorta which measures up to 2.8 cm in diameter shortly before the level of the aortic bifurcation. Prominent but nonenlarged 7 mm short axis celiac axis lymph node is noted, but is nonspecific. No lymphadenopathy noted in the abdomen or pelvis. Reproductive: Prostate gland and seminal vesicles are unremarkable in appearance. Other: Small right inguinal hernia containing only fat incidentally noted. No significant volume of ascites. No pneumoperitoneum. Musculoskeletal: There are no aggressive appearing lytic or blastic lesions noted in the visualized portions of the skeleton. IMPRESSION: 1. Distal esophageal mass measuring approximately 3.0 x 3.1 x 4.9 cm centered at the level of the gastroesophageal junction, with potential extension to involve the most proximal aspect of the cardia. There is no surrounding lymphadenopathy, and no definite evidence of metastatic disease in the chest, abdomen or pelvis. 2. Several tiny 2-4 mm pulmonary nodules scattered throughout the lungs bilaterally are considered highly nonspecific. As most of these are subpleural in location, these are favored to represent subpleural lymph nodes, however, attention on followup studies is recommended to ensure the stability or resolution of these findings. 3. Aortic atherosclerosis, in addition to three-vessel coronary artery disease. Please note that although the presence of coronary artery calcium documents the presence of coronary artery disease, the severity of this disease and any potential stenosis cannot be assessed on this non-gated CT examination. Assessment for potential risk factor modification, dietary therapy or pharmacologic therapy may be warranted, if clinically indicated. 4. Colonic diverticulosis without evidence of acute diverticulitis at this time. 5. Additional incidental findings, as  above. Electronically Signed   By: Vinnie Langton M.D.   On: 06/26/2016 11:33    ASSESSMENT: Stage IIb adenocarcinoma of the lower third of the esophagus.  PLAN:    1. Stage IIb adenocarcinoma of the lower third of the esophagus: Patient had EUS completed at [facility] confirming the stage and diagnosis. CT scan of the chest, abdomen, and pelvis did not reveal any metastatic disease. Patient will benefit from neoadjuvant chemotherapy and XRT, possibly followed by esophagectomy. A referral has been made to [facility] for surgical evaluation. Patient will return to clinic in 1 week to discuss his imaging results and treatment planning. He will also have consultation with radiation oncology at that time. 2. Esophageal obstruction: Patient has progressive difficulty swallowing foods and although he has not lost any weight up to this point, there is concern of maintaining his nutritional status. By report, patient also required a pediatric scope during his EGD secondary to his stenosis. Have recommended G-tube for nutritional support. This will also be necessary if patient undergoes esophagectomy. He plans to further discuss this with and make a final decision next week. 3. Thrombocytopenia: Previously entire workup did not reveal a distinct etiology. No bone marrow biopsy has been performed to this point. Continue to monitor closely, particularly since patient will likely be receiving chemotherapy in the near future.  4. Leukopenia: Patient's white blood cell count  is decreased, but approximately his baseline. Continue to monitor. 5. Renal insufficiency: Patient's creatinine is slightly elevated, possibly secondary to mild dehydration. Monitor.    Patient expressed understanding and was in agreement with this plan. He also understands that He can call clinic at any time with any questions, concerns, or complaints.    [name], MD   06/28/2016 9:41 AM

## 2016-06-24 ENCOUNTER — Ambulatory Visit (INDEPENDENT_AMBULATORY_CARE_PROVIDER_SITE_OTHER): Payer: Medicare Other | Admitting: Family Medicine

## 2016-06-24 ENCOUNTER — Encounter: Payer: Self-pay | Admitting: Family Medicine

## 2016-06-24 VITALS — BP 130/78 | HR 48 | Temp 97.8°F | Ht 70.25 in | Wt 205.6 lb

## 2016-06-24 DIAGNOSIS — C155 Malignant neoplasm of lower third of esophagus: Secondary | ICD-10-CM | POA: Diagnosis not present

## 2016-06-24 DIAGNOSIS — I2510 Atherosclerotic heart disease of native coronary artery without angina pectoris: Secondary | ICD-10-CM

## 2016-06-24 DIAGNOSIS — Z23 Encounter for immunization: Secondary | ICD-10-CM

## 2016-06-24 DIAGNOSIS — I2583 Coronary atherosclerosis due to lipid rich plaque: Secondary | ICD-10-CM | POA: Diagnosis not present

## 2016-06-24 DIAGNOSIS — E7800 Pure hypercholesterolemia, unspecified: Secondary | ICD-10-CM | POA: Diagnosis not present

## 2016-06-24 DIAGNOSIS — K219 Gastro-esophageal reflux disease without esophagitis: Secondary | ICD-10-CM | POA: Diagnosis not present

## 2016-06-24 DIAGNOSIS — D696 Thrombocytopenia, unspecified: Secondary | ICD-10-CM

## 2016-06-24 DIAGNOSIS — Z0000 Encounter for general adult medical examination without abnormal findings: Secondary | ICD-10-CM | POA: Diagnosis not present

## 2016-06-24 DIAGNOSIS — I10 Essential (primary) hypertension: Secondary | ICD-10-CM

## 2016-06-24 MED ORDER — LEVOTHYROXINE SODIUM 50 MCG PO TABS: 50.0000 ug | ORAL_TABLET | Freq: Every day | ORAL | 4 refills | Status: DC

## 2016-06-24 MED ORDER — PANTOPRAZOLE SODIUM 20 MG PO TBEC: 20.0000 mg | DELAYED_RELEASE_TABLET | Freq: Every day | ORAL | 4 refills | Status: DC

## 2016-06-24 MED ORDER — SIMVASTATIN 40 MG PO TABS: 40.0000 mg | ORAL_TABLET | Freq: Every day | ORAL | 4 refills | Status: DC

## 2016-06-24 MED ORDER — LISINOPRIL 10 MG PO TABS: 10.0000 mg | ORAL_TABLET | Freq: Every day | ORAL | 4 refills | Status: DC

## 2016-06-24 NOTE — Progress Notes (Signed)
BP 130/78 (BP Location: Left Arm, Patient Position: Sitting, Cuff Size: Normal)   Pulse (!) 48   Temp 97.8 F (36.6 C)   Ht 5' 10.25" (1.784 m)   Wt 205 lb 9.6 oz (93.3 kg)   SpO2 97%   BMI 29.29 kg/m    Subjective:    Patient ID: Corey Perry, male    DOB: May 13, 1946, 70 y.o.   MRN: [id number]  HPI: Corey Perry is a 70 y.o. male  Chief Complaint  Patient presents with  . Annual Exam  Patient for physical exam just recently diagnosed with esophageal adenocarcinoma has further appointments later this week to further evaluate. Has otherwise been doing well on medications as had problems with hemorrhoids and treatment including rectal evaluation done at the emergency room just a few days ago. Has been given some cream and suppositories which is helped with some of the inflammation. Also has some stool softeners. Other medical issues blood pressure coronary artery disease thyroid all stable.  Relevant past medical, surgical, family and social history reviewed and updated as indicated. Interim medical history since our last visit reviewed. Allergies and medications reviewed and updated.  Review of Systems  Constitutional: Negative.   HENT: Negative.   Eyes: Negative.   Respiratory: Negative.   Cardiovascular: Negative.   Gastrointestinal: Negative.        Dysphagia being managed by food avoidance and fine chewing.  Endocrine: Negative.   Genitourinary: Negative.   Musculoskeletal: Negative.   Skin: Negative.   Allergic/Immunologic: Negative.   Neurological: Negative.   Hematological: Negative.   Psychiatric/Behavioral: Negative.     Per HPI unless specifically indicated above     Objective:    BP 130/78 (BP Location: Left Arm, Patient Position: Sitting, Cuff Size: Normal)   Pulse (!) 48   Temp 97.8 F (36.6 C)   Ht 5' 10.25" (1.784 m)   Wt 205 lb 9.6 oz (93.3 kg)   SpO2 97%   BMI 29.29 kg/m   Wt Readings from Last 3 Encounters:  06/24/16 205 lb 9.6  oz (93.3 kg)  06/22/16 210 lb (95.3 kg)  06/14/16 212 lb (96.2 kg)    Physical Exam  Constitutional: He is oriented to person, place, and time. He appears well-developed and well-nourished.  HENT:  Head: Normocephalic.  Right Ear: External ear normal.  Left Ear: External ear normal.  Nose: Nose normal.  Eyes: Conjunctivae and EOM are normal. Pupils are equal, round, and reactive to light.  Neck: Normal range of motion. Neck supple. No thyromegaly present.  Cardiovascular: Normal rate, regular rhythm, normal heart sounds and intact distal pulses.   Pulmonary/Chest: Effort normal and breath sounds normal.  Abdominal: Soft. Bowel sounds are normal. There is no splenomegaly or hepatomegaly.  Genitourinary: Penis normal.  Musculoskeletal: Normal range of motion.  Lymphadenopathy:    He has no cervical adenopathy.  Neurological: He is alert and oriented to person, place, and time. He has normal reflexes.  Skin: Skin is warm and dry.  Psychiatric: He has a normal mood and affect. His behavior is normal. Judgment and thought content normal.    Results for orders placed or performed during the hospital encounter of 06/14/16  Surgical pathology  Result Value Ref Range   SURGICAL PATHOLOGY      Surgical Pathology CASE: [id number] PATIENT: [name] Surgical Pathology Report     SPECIMEN SUBMITTED: A. Stomach, antrum and body; cbx B. Esophagus, distal; cbx  CLINICAL HISTORY: None provided  PRE-OPERATIVE DIAGNOSIS:  Dysphagia  POST-OPERATIVE DIAGNOSIS: Distal esophageal stenosis probable esophageal mass, duodenitis     DIAGNOSIS: A. STOMACH, ANTRUM AND BODY; COLD BIOPSY: - ANTRAL AND OXYNTIC MUCOSA WITH MILD CHRONIC GASTRITIS, NONSPECIFIC. - NEGATIVE FOR H. PYLORI, DYSPLASIA, AND MALIGNANCY.  B. ESOPHAGUS, DISTAL; COLD BIOPSY: - POORLY DIFFERENTIATED ADENOCARCINOMA.  Comment: The poorly differentiated carcinoma identified in the biopsy of the distal esophagus  (specimen B) undermines unremarkable squamous mucosa. Background Barretts / intestinal metaplasia is not identified. A limited panel of immunohistochemical stains was performed to subclassify the poorly differentiated carcinoma identified on HE stained sections. The carcinoma d emonstrates the following pattern of immunoreactivity: Cytokeratin 7: Positive CEA: Positive p40: Negative The above pattern of staining is consistent with poorly differentiated adenocarcinoma. Stain controls worked appropriately Preliminary results were communicated to Dr. Gustavo Lah on 06/17/2016.   GROSS DESCRIPTION:  A. Labeled: antrum and body C BX  Tissue fragment(s): 4  Size: 0.2-0.25 cm  Description: pink-tan  Entirely submitted in 1 cassette(s).   B. Labeled: distal esophagus C BX  Tissue fragment(s): multiple  Size: aggregate, 0.7 x 0.2 x 0.1 cm  Description: pink to tan fragments  Entirely submitted in 1 cassette(s).    Final Diagnosis performed by Quay Burow, MD.  Electronically signed 06/18/2016 2:30:07PM    The electronic signature indicates that the named Attending Pathologist has evaluated the specimen  Technical component performed at [facility], [address], [place], [place] [address] Lab: [id number] Dir: Darrick Penna. Evette Doffing, MD   Professional component performed at [facility], [facility], [place], [place], [place] [address] Lab: [phone] Dir: Dellia Nims. Reuel Derby, MD        Assessment & Plan:   Problem List Items Addressed This Visit      Cardiovascular and Mediastinum   Hypertension    The current medical regimen is effective;  continue present plan and medications.       Relevant Medications   simvastatin (ZOCOR) 40 MG tablet   lisinopril (PRINIVIL,ZESTRIL) 10 MG tablet   CAD (coronary artery disease)    The current medical regimen is effective;  continue present plan and medications.       Relevant Medications   simvastatin  (ZOCOR) 40 MG tablet   lisinopril (PRINIVIL,ZESTRIL) 10 MG tablet     Digestive   Acid reflux   Relevant Medications   pantoprazole (PROTONIX) 20 MG tablet   Esophageal cancer (HCC)    Workup in progress        Other   Hyperlipidemia   Relevant Medications   simvastatin (ZOCOR) 40 MG tablet   lisinopril (PRINIVIL,ZESTRIL) 10 MG tablet   Thrombocytopenia (HCC)    Followed by hematology       Other Visit Diagnoses    Need for influenza vaccination    -  Primary   Relevant Orders   Flu vaccine HIGH DOSE PF (Fluzone High dose) (Completed)   Annual physical exam       Relevant Orders   CBC with Differential/Platelet   Comprehensive metabolic panel   Lipid panel   PSA   Urinalysis, Routine w reflex microscopic (not at [facility])   TSH   Flu vaccine HIGH DOSE PF (Fluzone High dose) (Completed)       Follow up plan: Return in about 6 months (around 12/23/2016) for BMP.

## 2016-06-24 NOTE — Assessment & Plan Note (Signed)
Work-up in progress 

## 2016-06-24 NOTE — Assessment & Plan Note (Signed)
Followed by hematology 

## 2016-06-24 NOTE — Assessment & Plan Note (Signed)
The current medical regimen is effective;  continue present plan and medications.  

## 2016-06-25 ENCOUNTER — Inpatient Hospital Stay: Payer: Medicare Other

## 2016-06-25 ENCOUNTER — Inpatient Hospital Stay: Payer: Medicare Other | Attending: Oncology | Admitting: Oncology

## 2016-06-25 VITALS — BP 126/64 | HR 50 | Temp 97.6°F | Resp 18 | Wt 208.3 lb

## 2016-06-25 DIAGNOSIS — E785 Hyperlipidemia, unspecified: Secondary | ICD-10-CM | POA: Insufficient documentation

## 2016-06-25 DIAGNOSIS — I2510 Atherosclerotic heart disease of native coronary artery without angina pectoris: Secondary | ICD-10-CM | POA: Diagnosis not present

## 2016-06-25 DIAGNOSIS — M48061 Spinal stenosis, lumbar region without neurogenic claudication: Secondary | ICD-10-CM | POA: Diagnosis not present

## 2016-06-25 DIAGNOSIS — I129 Hypertensive chronic kidney disease with stage 1 through stage 4 chronic kidney disease, or unspecified chronic kidney disease: Secondary | ICD-10-CM | POA: Diagnosis not present

## 2016-06-25 DIAGNOSIS — F419 Anxiety disorder, unspecified: Secondary | ICD-10-CM | POA: Diagnosis not present

## 2016-06-25 DIAGNOSIS — I700 Atherosclerosis of aorta: Secondary | ICD-10-CM | POA: Insufficient documentation

## 2016-06-25 DIAGNOSIS — K222 Esophageal obstruction: Secondary | ICD-10-CM | POA: Insufficient documentation

## 2016-06-25 DIAGNOSIS — N189 Chronic kidney disease, unspecified: Secondary | ICD-10-CM | POA: Insufficient documentation

## 2016-06-25 DIAGNOSIS — R001 Bradycardia, unspecified: Secondary | ICD-10-CM

## 2016-06-25 DIAGNOSIS — D72819 Decreased white blood cell count, unspecified: Secondary | ICD-10-CM | POA: Insufficient documentation

## 2016-06-25 DIAGNOSIS — E039 Hypothyroidism, unspecified: Secondary | ICD-10-CM | POA: Diagnosis not present

## 2016-06-25 DIAGNOSIS — D696 Thrombocytopenia, unspecified: Secondary | ICD-10-CM | POA: Diagnosis not present

## 2016-06-25 DIAGNOSIS — Z87891 Personal history of nicotine dependence: Secondary | ICD-10-CM | POA: Insufficient documentation

## 2016-06-25 DIAGNOSIS — Z79899 Other long term (current) drug therapy: Secondary | ICD-10-CM | POA: Insufficient documentation

## 2016-06-25 DIAGNOSIS — E860 Dehydration: Secondary | ICD-10-CM | POA: Diagnosis not present

## 2016-06-25 DIAGNOSIS — R918 Other nonspecific abnormal finding of lung field: Secondary | ICD-10-CM | POA: Insufficient documentation

## 2016-06-25 DIAGNOSIS — K4090 Unilateral inguinal hernia, without obstruction or gangrene, not specified as recurrent: Secondary | ICD-10-CM | POA: Diagnosis not present

## 2016-06-25 DIAGNOSIS — R1310 Dysphagia, unspecified: Secondary | ICD-10-CM | POA: Diagnosis not present

## 2016-06-25 DIAGNOSIS — G4730 Sleep apnea, unspecified: Secondary | ICD-10-CM | POA: Diagnosis not present

## 2016-06-25 DIAGNOSIS — K219 Gastro-esophageal reflux disease without esophagitis: Secondary | ICD-10-CM | POA: Diagnosis not present

## 2016-06-25 DIAGNOSIS — K5730 Diverticulosis of large intestine without perforation or abscess without bleeding: Secondary | ICD-10-CM | POA: Diagnosis not present

## 2016-06-25 DIAGNOSIS — R112 Nausea with vomiting, unspecified: Secondary | ICD-10-CM | POA: Diagnosis not present

## 2016-06-25 DIAGNOSIS — C155 Malignant neoplasm of lower third of esophagus: Secondary | ICD-10-CM | POA: Diagnosis present

## 2016-06-25 DIAGNOSIS — K921 Melena: Secondary | ICD-10-CM | POA: Diagnosis not present

## 2016-06-25 DIAGNOSIS — N400 Enlarged prostate without lower urinary tract symptoms: Secondary | ICD-10-CM | POA: Insufficient documentation

## 2016-06-25 DIAGNOSIS — Z5111 Encounter for antineoplastic chemotherapy: Secondary | ICD-10-CM | POA: Insufficient documentation

## 2016-06-25 DIAGNOSIS — Z7982 Long term (current) use of aspirin: Secondary | ICD-10-CM | POA: Insufficient documentation

## 2016-06-25 LAB — COMPREHENSIVE METABOLIC PANEL
ALBUMIN: 4.6 g/dL (ref 3.5–5.0)
ALT: 23 U/L (ref 17–63)
ANION GAP: 9 (ref 5–15)
AST: 23 U/L (ref 15–41)
Alkaline Phosphatase: 47 U/L (ref 38–126)
BUN: 19 mg/dL (ref 6–20)
CO2: 25 mmol/L (ref 22–32)
Calcium: 9.3 mg/dL (ref 8.9–10.3)
Chloride: 99 mmol/L — ABNORMAL LOW (ref 101–111)
Creatinine, Ser: 1.43 mg/dL — ABNORMAL HIGH (ref 0.61–1.24)
GFR calc Af Amer: 56 mL/min — ABNORMAL LOW (ref >60)
GFR calc non Af Amer: 48 mL/min — ABNORMAL LOW (ref >60)
GLUCOSE: 104 mg/dL — AB (ref 65–99)
POTASSIUM: 4.5 mmol/L (ref 3.5–5.1)
SODIUM: 133 mmol/L — AB (ref 135–145)
TOTAL PROTEIN: 7.6 g/dL (ref 6.5–8.1)
Total Bilirubin: 0.8 mg/dL (ref 0.3–1.2)

## 2016-06-25 LAB — CBC WITH DIFFERENTIAL/PLATELET
BASOS PCT: 0 %
Basophils Absolute: 0 10*3/uL (ref 0–0.1)
EOS ABS: 0 10*3/uL (ref 0–0.7)
Eosinophils Relative: 1 %
HCT: 39.6 % — ABNORMAL LOW (ref 40.0–52.0)
Hemoglobin: 13.5 g/dL (ref 13.0–18.0)
Lymphocytes Relative: 20 %
Lymphs Abs: 0.6 10*3/uL — ABNORMAL LOW (ref 1.0–3.6)
MCH: 34.2 pg — ABNORMAL HIGH (ref 26.0–34.0)
MCHC: 34.2 g/dL (ref 32.0–36.0)
MCV: 100 fL (ref 80.0–100.0)
MONO ABS: 0.5 10*3/uL (ref 0.2–1.0)
MONOS PCT: 15 %
NEUTROS PCT: 64 %
Neutro Abs: 2 10*3/uL (ref 1.4–6.5)
PLATELETS: 112 10*3/uL — AB (ref 150–440)
RBC: 3.96 MIL/uL — ABNORMAL LOW (ref 4.40–5.90)
RDW: 14.3 % (ref 11.5–14.5)
WBC: 3.2 10*3/uL — ABNORMAL LOW (ref 3.8–10.6)

## 2016-06-25 NOTE — Progress Notes (Signed)
Recently had EUS performed. Here for results. Offers no complaints.

## 2016-06-25 NOTE — Progress Notes (Signed)
  Oncology Nurse Navigator Documentation Met with Corey Perry and his spouse along with Dr. [name]. Introduced Therapist, nutritional and provided contact information for future questions. education, support. Will send referral to Dr. [name] at [facility] and Dietician referral for PEG tube education Navigator Location: [facility]c (06/25/16 1200) Navigator Encounter Type: Initial MedOnc (06/25/16 1200)             Treatment Phase: Pre-Tx/Tx Discussion (06/25/16 1200) Barriers/Navigation Needs: Coordination of Care (06/25/16 1200)   Interventions: Coordination of Care;Referrals (06/25/16 1200) Referrals: Nutrition/dietician;Other (Dr. [name]) (06/25/16 1200)          Acuity: Level 3 (06/25/16 1200)     Acuity Level 3: Nutritional support;Coordination of multimodality treatment;Emotional needs;Ongoing guidance and education provided throughout treatment (06/25/16 1200)   Time Spent with Patient: 60 (06/25/16 1200)

## 2016-06-25 NOTE — Progress Notes (Signed)
ALERT: Recent [facility] Treatment decision is outdated. Please await next [facility] decision 

## 2016-06-26 ENCOUNTER — Ambulatory Visit
Admission: RE | Admit: 2016-06-26 | Discharge: 2016-06-26 | Disposition: A | Discharge status: Home / Self Care | Payer: Medicare Other | Source: Ambulatory Visit | Attending: Gastroenterology | Admitting: Gastroenterology

## 2016-06-26 DIAGNOSIS — I2510 Atherosclerotic heart disease of native coronary artery without angina pectoris: Secondary | ICD-10-CM | POA: Diagnosis not present

## 2016-06-26 DIAGNOSIS — I700 Atherosclerosis of aorta: Secondary | ICD-10-CM | POA: Insufficient documentation

## 2016-06-26 DIAGNOSIS — C159 Malignant neoplasm of esophagus, unspecified: Secondary | ICD-10-CM | POA: Diagnosis present

## 2016-06-26 DIAGNOSIS — K5730 Diverticulosis of large intestine without perforation or abscess without bleeding: Secondary | ICD-10-CM | POA: Diagnosis not present

## 2016-06-26 DIAGNOSIS — R918 Other nonspecific abnormal finding of lung field: Secondary | ICD-10-CM | POA: Diagnosis not present

## 2016-06-26 HISTORY — DX: Malignant neoplasm of esophagus, unspecified: C15.9

## 2016-06-26 MED ORDER — IOPAMIDOL (ISOVUE-300) INJECTION 61%
85.0000 mL | Freq: Once | INTRAVENOUS | Status: AC | PRN
  Administered 2016-06-26: 85 mL via INTRAVENOUS

## 2016-06-27 NOTE — Progress Notes (Signed)
  Oncology Nurse Navigator Documentation Request made with radiology to share Ct with [facility] via powershare Navigator Location: [facility] (06/27/16 1100) Navigator Encounter Type: Letter/Fax/Email;Diagnostic Results (06/27/16 1100)                                          Time Spent with Patient: 15 (06/27/16 1100)

## 2016-06-28 NOTE — Progress Notes (Signed)
  Oncology Nurse Navigator Documentation Referral has been sent to Dr. Elenor Quinones at [facility] for surgical evaluation.  Navigator Location: [facility] (06/28/16 1100) Navigator Encounter Type: Letter/Fax/Email (06/28/16 1100)                                          Time Spent with Patient: 15 (06/28/16 1100)

## 2016-07-01 NOTE — Progress Notes (Signed)
[place]  Telephone:(336) [phone] Fax:(336) [phone]  ID: Corey Perry OB: [birth year]  MR#: [id number]  PNT#:[id number]  Patient Care Team: [name], MD as PCP - General (Family Medicine) [name], MD as PCP - Family Medicine (Family Medicine) [name], MD (Specialist) [name], MD as Referring Physician (Internal Medicine) [name], MD as Consulting Physician (Neurosurgery)  CHIEF COMPLAINT: Stage IIb adenocarcinoma of the lower third of the esophagus.  INTERVAL HISTORY: Patient returns to clinic today for further evaluation and treatment planning. He continues to have difficulty swallowing accompanied with regurgitation of food. He even states he has occasional difficulty swallowing water. Despite this, his weight has remained relatively stable. He continues to be anxious, but otherwise feels well. He denies any recent fevers or illnesses. He has no neurologic complaints. He denies any chest pain or shortness of breath. He denies any constipation or diarrhea. He has no urinary complaints. Patient offers no further specific complaints today.  REVIEW OF SYSTEMS:   Review of Systems  Constitutional: Negative.  Negative for fever, malaise/fatigue and weight loss.  HENT: Negative.   Respiratory: Negative.  Negative for cough and shortness of breath.   Cardiovascular: Negative.  Negative for chest pain and leg swelling.  Gastrointestinal: Positive for vomiting. Negative for abdominal pain, blood in stool, melena and nausea.  Genitourinary: Negative.   Musculoskeletal: Negative.   Neurological: Negative.  Negative for weakness.  Psychiatric/Behavioral: The patient is nervous/anxious.     As per HPI. Otherwise, a complete review of systems is negative.  PAST MEDICAL HISTORY: Past Medical History:  Diagnosis Date  . Abnormal white blood cell (WBC) count    seeing Dr. [name] at [place] on 10/25  . BPH (benign prostatic hypertrophy)    . Bradycardia   . CAD (coronary artery disease)   . Chronic kidney disease   . Dysrhythmia    bradycardia - sees Dr. Humphrey Rolls  . Esophageal cancer ([place]) 05/2016  . GERD (gastroesophageal reflux disease)   . Hyperlipidemia   . Hypertension   . Hypothyroidism   . Sleep apnea    mild - SS - Care everywhere -04/18/07  . Spinal stenosis    lumbar    PAST SURGICAL HISTORY: Past Surgical History:  Procedure Laterality Date  . BACK SURGERY    . CARDIAC CATHETERIZATION Left 03/12/2016   Procedure: Left Heart Cath and Coronary Angiography;  Surgeon: [name], MD;  Location: [place] [facility];  Service: Cardiovascular;  Laterality: Left;  . CARDIAC CATHETERIZATION N/A 03/12/2016   Procedure: Coronary Stent Intervention;  Surgeon: Yolonda Kida, MD;  Location: [place] [facility];  Service: Cardiovascular;  Laterality: N/A;  . COLONOSCOPY WITH PROPOFOL N/A 07/13/2015   Procedure: COLONOSCOPY WITH PROPOFOL;  Surgeon: Lucilla Lame, MD;  Location: [place];  Service: Endoscopy;  Laterality: N/A;  . CORONARY ANGIOPLASTY  10/05 and 12/05   4 DES placed  . ESOPHAGOGASTRODUODENOSCOPY (EGD) WITH PROPOFOL N/A 06/14/2016   Procedure: ESOPHAGOGASTRODUODENOSCOPY (EGD) WITH PROPOFOL;  Surgeon: [name], MD;  Location[facility] END[facility]COPY;  Service: Endoscopy;  Laterality: N/A;  . HERNIA REPAIR     x3  . POLYPECTOMY  07/13/2015   Procedure: POLYPECTOMY;  Surgeon: Lucilla Lame, MD;  Location: [place];  Service: Endoscopy;;    FAMILY HISTORY Family History  Problem Relation Age of Onset  . Hypertension Mother   . Heart disease Father   . Hypertension Father   . Kidney disease Father   .  Hypertension Brother   . Diabetes Son        ADVANCED DIRECTIVES:    HEALTH MAINTENANCE: Social History  Substance Use Topics  . Smoking status: Former Smoker    Packs/day: 1.00    Years: 40.00    Types: Cigarettes    Quit date: 07/16/2004  . Smokeless tobacco: Former  Systems developer    Types: Chew  . Alcohol use No     Colonoscopy:  PAP:  Bone density:  Lipid panel:  No Known Allergies  Current Outpatient Prescriptions  Medication Sig Dispense Refill  . Ascorbic Acid (VITAMIN C) 1000 MG tablet Take 1,000 mg by mouth daily.    Marland Kitchen aspirin 81 MG tablet Take 81 mg by mouth daily. AM    . docusate (COLACE) 60 MG/15ML syrup Take 15 mLs (60 mg total) by mouth daily. 200 mL 0  . hydrocortisone (ANUSOL-HC) 2.5 % rectal cream Apply rectally 2 times daily 30 g 1  . isosorbide mononitrate (IMDUR) 30 MG 24 hr tablet 30 mg daily. AM  5  . levothyroxine (SYNTHROID, LEVOTHROID) 50 MCG tablet Take 1 tablet (50 mcg total) by mouth daily. 90 tablet 4  . lisinopril (PRINIVIL,ZESTRIL) 10 MG tablet Take 1 tablet (10 mg total) by mouth daily. 90 tablet 4  . multivitamin-iron-minerals-folic acid (CENTRUM) chewable tablet Chew 1 tablet by mouth daily.    . nitroGLYCERIN (NITROSTAT) 0.4 MG SL tablet Place 1 tablet under the tongue every 5 (five) minutes as needed.  98  . pantoprazole (PROTONIX) 20 MG tablet Take 1 tablet (20 mg total) by mouth daily. 90 tablet 4  . simvastatin (ZOCOR) 40 MG tablet Take 1 tablet (40 mg total) by mouth daily. 90 tablet 4  . ticagrelor (BRILINTA) 90 MG TABS tablet Take 1 tablet by mouth daily.    . vitamin E (VITAMIN E) 400 UNIT capsule Take 400 Units by mouth daily.     No current facility-administered medications for this visit.     OBJECTIVE: There were no vitals filed for this visit.   There is no height or weight on file to calculate BMI.    ECOG FS:0 - Asymptomatic  General: Well-developed, well-nourished, no acute distress. Eyes: Pink conjunctiva, anicteric sclera. Lungs: Clear to auscultation bilaterally. Heart: Regular rate and rhythm. No rubs, murmurs, or gallops. Abdomen: Soft, nontender, nondistended. No organomegaly noted, normoactive bowel sounds. Musculoskeletal: No edema, cyanosis, or clubbing. Neuro: Alert, answering all  questions appropriately. Cranial nerves grossly intact. Skin: No rashes or petechiae noted. Psych: Normal affect.   LAB RESULTS:  Lab Results  Component Value Date   NA 133 (L) 06/25/2016   K 4.5 06/25/2016   CL 99 (L) 06/25/2016   CO2 25 06/25/2016   GLUCOSE 104 (H) 06/25/2016   BUN 19 06/25/2016   CREATININE 1.43 (H) 06/25/2016   CALCIUM 9.3 06/25/2016   PROT 7.6 06/25/2016   ALBUMIN 4.6 06/25/2016   AST 23 06/25/2016   ALT 23 06/25/2016   ALKPHOS 47 06/25/2016   BILITOT 0.8 06/25/2016   GFRNONAA 48 (L) 06/25/2016   GFRAA 56 (L) 06/25/2016    Lab Results  Component Value Date   WBC 3.2 (L) 06/25/2016   NEUTROABS 2.0 06/25/2016   HGB 13.5 06/25/2016   HCT 39.6 (L) 06/25/2016   MCV 100.0 06/25/2016   PLT 112 (L) 06/25/2016     STUDIES: Dg Abdomen 1 View  Result Date: 06/22/2016 CLINICAL DATA:  Burning sensation while trying to have a bowel movement. Recent large bloody stool.  EXAM: ABDOMEN - 1 VIEW COMPARISON:  CT scan March 10, 2009 FINDINGS: High attenuation in the pelvis is likely contrast from previous CT imaging within colonic diverticuli. There are probably a few diverticula in the right colon as well. No free air, portal venous gas, or pneumatosis. No bowel obstruction. The lung bases are normal. IMPRESSION: Contrast within colonic diverticuli. No acute abnormalities are seen. Electronically Signed   By: Dorise Bullion III M.D   On: 06/22/2016 21:35   Ct Chest W Contrast  Result Date: 06/26/2016 CLINICAL DATA:  70 year old male with history of esophageal cancer diagnosed by endoscopy at [facility] 06/18/2016. Initial staging examination. EXAM: CT CHEST, ABDOMEN, AND PELVIS WITH CONTRAST TECHNIQUE: Multidetector CT imaging of the chest, abdomen and pelvis was performed following the standard protocol during bolus administration of intravenous contrast. CONTRAST:  81m ISOVUE-300 IOPAMIDOL (ISOVUE-300) INJECTION 61% COMPARISON:  CT the abdomen and pelvis 03/10/2009.  FINDINGS: CT CHEST FINDINGS Cardiovascular: Heart size is normal. There is no significant pericardial fluid, thickening or pericardial calcification. There is aortic atherosclerosis, as well as atherosclerosis of the great vessels of the mediastinum and the coronary arteries, including calcified atherosclerotic plaque in the left anterior descending, ramus intermedius, left circumflex and right coronary arteries. Coronary artery stents are noted in the left anterior descending and ramus intermedius coronary arteries. Mediastinum/Nodes: Distal esophageal mass at the level of the gastroesophageal junction measures approximately 3.0 x 3.1 x 4.9 cm (axial image 51 of series 2 and sagittal image 114 of series 6). More proximal aspect of the esophagus is otherwise unremarkable. No pathologically enlarged mediastinal or hilar lymph nodes. No axillary lymphadenopathy. Lungs/Pleura: There multiple tiny 2-4 mm pulmonary nodules scattered throughout the lungs bilaterally, nonspecific, but favored to be benign. Most of these are associated with the fissures, and are likely to represent subpleural lymph nodes. No other larger more suspicious appearing pulmonary nodules or masses are noted. There is no acute consolidative airspace disease. No pleural effusions. Musculoskeletal: There are no aggressive appearing lytic or blastic lesions noted in the visualized portions of the skeleton. CT ABDOMEN PELVIS FINDINGS Hepatobiliary: No cystic or solid hepatic lesions. No intra or extrahepatic biliary ductal dilatation. Gallbladder is normal in appearance. Pancreas: No pancreatic mass. No pancreatic ductal dilatation. No pancreatic or peripancreatic fluid or inflammatory changes. Spleen: Unremarkable. Adrenals/Urinary Tract: Sub cm low-attenuation lesion in the upper pole of the left kidney is too small to definitively characterize, but appears similar in retrospect to prior study 03/10/2009, likely a cyst. Right kidney and bilateral  adrenal glands are normal in appearance. There is no hydroureteronephrosis. Urinary bladder is normal in appearance. Stomach/Bowel: The mass in the distal esophagus is at the level of the gastroesophageal junction, and may extend to involve the very proximal aspect of the cardia of the stomach (this is uncertain). More distal aspect of the stomach is otherwise unremarkable in appearance. No pathologic dilatation of small bowel or colon. Numerous colonic diverticulae are noted, particularly in the sigmoid colon, without surrounding inflammatory changes to suggest an acute diverticulitis at this time. Normal appendix. Vascular/Lymphatic: Aortic atherosclerosis, with ectasia of the distal infrarenal abdominal aorta which measures up to 2.8 cm in diameter shortly before the level of the aortic bifurcation. Prominent but nonenlarged 7 mm short axis celiac axis lymph node is noted, but is nonspecific. No lymphadenopathy noted in the abdomen or pelvis. Reproductive: Prostate gland and seminal vesicles are unremarkable in appearance. Other: Small right inguinal hernia containing only fat incidentally noted. No significant volume of ascites.  No pneumoperitoneum. Musculoskeletal: There are no aggressive appearing lytic or blastic lesions noted in the visualized portions of the skeleton. IMPRESSION: 1. Distal esophageal mass measuring approximately 3.0 x 3.1 x 4.9 cm centered at the level of the gastroesophageal junction, with potential extension to involve the most proximal aspect of the cardia. There is no surrounding lymphadenopathy, and no definite evidence of metastatic disease in the chest, abdomen or pelvis. 2. Several tiny 2-4 mm pulmonary nodules scattered throughout the lungs bilaterally are considered highly nonspecific. As most of these are subpleural in location, these are favored to represent subpleural lymph nodes, however, attention on followup studies is recommended to ensure the stability or resolution of  these findings. 3. Aortic atherosclerosis, in addition to three-vessel coronary artery disease. Please note that although the presence of coronary artery calcium documents the presence of coronary artery disease, the severity of this disease and any potential stenosis cannot be assessed on this non-gated CT examination. Assessment for potential risk factor modification, dietary therapy or pharmacologic therapy may be warranted, if clinically indicated. 4. Colonic diverticulosis without evidence of acute diverticulitis at this time. 5. Additional incidental findings, as above. Electronically Signed   By: Vinnie Langton M.D.   On: 06/26/2016 11:33   Ct Abdomen Pelvis W Contrast  Result Date: 06/26/2016 CLINICAL DATA:  70 year old male with history of esophageal cancer diagnosed by endoscopy at [facility] 06/18/2016. Initial staging examination. EXAM: CT CHEST, ABDOMEN, AND PELVIS WITH CONTRAST TECHNIQUE: Multidetector CT imaging of the chest, abdomen and pelvis was performed following the standard protocol during bolus administration of intravenous contrast. CONTRAST:  53m ISOVUE-300 IOPAMIDOL (ISOVUE-300) INJECTION 61% COMPARISON:  CT the abdomen and pelvis 03/10/2009. FINDINGS: CT CHEST FINDINGS Cardiovascular: Heart size is normal. There is no significant pericardial fluid, thickening or pericardial calcification. There is aortic atherosclerosis, as well as atherosclerosis of the great vessels of the mediastinum and the coronary arteries, including calcified atherosclerotic plaque in the left anterior descending, ramus intermedius, left circumflex and right coronary arteries. Coronary artery stents are noted in the left anterior descending and ramus intermedius coronary arteries. Mediastinum/Nodes: Distal esophageal mass at the level of the gastroesophageal junction measures approximately 3.0 x 3.1 x 4.9 cm (axial image 51 of series 2 and sagittal image 114 of series 6). More proximal aspect of the esophagus is  otherwise unremarkable. No pathologically enlarged mediastinal or hilar lymph nodes. No axillary lymphadenopathy. Lungs/Pleura: There multiple tiny 2-4 mm pulmonary nodules scattered throughout the lungs bilaterally, nonspecific, but favored to be benign. Most of these are associated with the fissures, and are likely to represent subpleural lymph nodes. No other larger more suspicious appearing pulmonary nodules or masses are noted. There is no acute consolidative airspace disease. No pleural effusions. Musculoskeletal: There are no aggressive appearing lytic or blastic lesions noted in the visualized portions of the skeleton. CT ABDOMEN PELVIS FINDINGS Hepatobiliary: No cystic or solid hepatic lesions. No intra or extrahepatic biliary ductal dilatation. Gallbladder is normal in appearance. Pancreas: No pancreatic mass. No pancreatic ductal dilatation. No pancreatic or peripancreatic fluid or inflammatory changes. Spleen: Unremarkable. Adrenals/Urinary Tract: Sub cm low-attenuation lesion in the upper pole of the left kidney is too small to definitively characterize, but appears similar in retrospect to prior study 03/10/2009, likely a cyst. Right kidney and bilateral adrenal glands are normal in appearance. There is no hydroureteronephrosis. Urinary bladder is normal in appearance. Stomach/Bowel: The mass in the distal esophagus is at the level of the gastroesophageal junction, and may extend to involve the  very proximal aspect of the cardia of the stomach (this is uncertain). More distal aspect of the stomach is otherwise unremarkable in appearance. No pathologic dilatation of small bowel or colon. Numerous colonic diverticulae are noted, particularly in the sigmoid colon, without surrounding inflammatory changes to suggest an acute diverticulitis at this time. Normal appendix. Vascular/Lymphatic: Aortic atherosclerosis, with ectasia of the distal infrarenal abdominal aorta which measures up to 2.8 cm in diameter  shortly before the level of the aortic bifurcation. Prominent but nonenlarged 7 mm short axis celiac axis lymph node is noted, but is nonspecific. No lymphadenopathy noted in the abdomen or pelvis. Reproductive: Prostate gland and seminal vesicles are unremarkable in appearance. Other: Small right inguinal hernia containing only fat incidentally noted. No significant volume of ascites. No pneumoperitoneum. Musculoskeletal: There are no aggressive appearing lytic or blastic lesions noted in the visualized portions of the skeleton. IMPRESSION: 1. Distal esophageal mass measuring approximately 3.0 x 3.1 x 4.9 cm centered at the level of the gastroesophageal junction, with potential extension to involve the most proximal aspect of the cardia. There is no surrounding lymphadenopathy, and no definite evidence of metastatic disease in the chest, abdomen or pelvis. 2. Several tiny 2-4 mm pulmonary nodules scattered throughout the lungs bilaterally are considered highly nonspecific. As most of these are subpleural in location, these are favored to represent subpleural lymph nodes, however, attention on followup studies is recommended to ensure the stability or resolution of these findings. 3. Aortic atherosclerosis, in addition to three-vessel coronary artery disease. Please note that although the presence of coronary artery calcium documents the presence of coronary artery disease, the severity of this disease and any potential stenosis cannot be assessed on this non-gated CT examination. Assessment for potential risk factor modification, dietary therapy or pharmacologic therapy may be warranted, if clinically indicated. 4. Colonic diverticulosis without evidence of acute diverticulitis at this time. 5. Additional incidental findings, as above. Electronically Signed   By: Trudie Reed M.D.   On: 06/26/2016 11:33   Nm Pet Image Initial (pi) Skull Base To Thigh  Result Date: 07/03/2016 CLINICAL DATA:  Initial  treatment strategy for esophageal carcinoma. EXAM: NUCLEAR MEDICINE PET SKULL BASE TO THIGH TECHNIQUE: 12.1 mCi F-18 FDG was injected intravenously. Full-ring PET imaging was performed from the skull base to thigh after the radiotracer. CT data was obtained and used for attenuation correction and anatomic localization. FASTING BLOOD GLUCOSE:  Value: 100 mg/dl COMPARISON:  CT 59/09/7239 FINDINGS: NECK No hypermetabolic lymph nodes in the neck. CHEST No hypermetabolic mediastinal or hilar nodes. No suspicious pulmonary nodules on the CT scan. ABDOMEN/PELVIS Hypermetabolic thickening the gastroesophageal junction measures 3.5 cm with SUV max equals 7.5. No hypermetabolic gastrohepatic ligament lymph nodes. No hypermetabolic foci within the liver. No hypermetabolic abdominal lymph nodes, periaortic lymph nodes or pelvic lymph nodes. Incidental finding of multiple sigmoid diverticula. Atherosclerotic calcification of the aorta. SKELETON No focal hypermetabolic activity to suggest skeletal metastasis. IMPRESSION: 1. Hypermetabolic thickening at the gastroesophageal junction consists with primary esophageal adenocarcinoma. 2. No evidence of local nodal metastasis within the gastrohepatic ligament or mediastinum. 3. No evidence of liver metastasis or distant metastasis Electronically Signed   By: Genevive Bi M.D.   On: 07/03/2016 15:07    ASSESSMENT: Stage IIb adenocarcinoma of the lower third of the esophagus.  PLAN:    1. Stage IIb adenocarcinoma of the lower third of the esophagus: Patient had EUS completed at [facility] confirming the stage and diagnosis. PET scan results reviewed independently and  reported as above with no obvious evidence of metastatic disease.  Patient will benefit from neoadjuvant chemotherapy and XRT, possibly followed by esophagectomy. A referral has been made to [facility] for surgical evaluation, patient appointment is on July 16, 2016. Return to clinic on July 09, 2016 to initiate cycle 1 of weekly carboplatinum and Taxol along with concurrent daily XRT which will start on July 15, 2016. Port placement has been requested, but this will not delay initiation of treatment. 2. Esophageal obstruction: Patient has progressive difficulty swallowing foods and although he has not lost any weight up to this point, there is concern of maintaining his nutritional status. By report, patient also required a pediatric scope during his EGD secondary to his stenosis. Patient had consultation with dietary today. He will require a J-tube for nutritional support, which will likely be placed by his surgeon performing the esophagectomy. This will also be necessary if patient undergoes esophagectomy. 3. Thrombocytopenia: Previously entire workup did not reveal a distinct etiology. No bone marrow biopsy has been performed to this point. Continue to monitor closely. 4. Leukopenia: Patient's white blood cell count is decreased, but approximately his baseline. Continue to monitor. 5. Renal insufficiency: Patient's creatinine is slightly elevated, possibly secondary to mild dehydration. Monitor.   Patient expressed understanding and was in agreement with this plan. He also understands that He can call clinic at any time with any questions, concerns, or complaints.    [name], MD   07/05/2016 1:09 PM

## 2016-07-02 ENCOUNTER — Other Ambulatory Visit: Payer: Self-pay | Admitting: *Deleted

## 2016-07-02 ENCOUNTER — Inpatient Hospital Stay (HOSPITAL_BASED_OUTPATIENT_CLINIC_OR_DEPARTMENT_OTHER): Payer: Medicare Other | Admitting: Oncology

## 2016-07-02 ENCOUNTER — Ambulatory Visit
Admission: RE | Admit: 2016-07-02 | Discharge: 2016-07-02 | Disposition: A | Discharge status: Home / Self Care | Payer: Medicare Other | Source: Ambulatory Visit | Attending: Radiation Oncology | Admitting: Radiation Oncology

## 2016-07-02 ENCOUNTER — Encounter: Payer: Self-pay | Admitting: Radiation Oncology

## 2016-07-02 ENCOUNTER — Other Ambulatory Visit: Payer: Self-pay | Admitting: Oncology

## 2016-07-02 VITALS — BP 135/74 | HR 47 | Temp 98.0°F | Wt 207.5 lb

## 2016-07-02 DIAGNOSIS — K222 Esophageal obstruction: Secondary | ICD-10-CM | POA: Diagnosis not present

## 2016-07-02 DIAGNOSIS — N189 Chronic kidney disease, unspecified: Secondary | ICD-10-CM

## 2016-07-02 DIAGNOSIS — Z87891 Personal history of nicotine dependence: Secondary | ICD-10-CM | POA: Diagnosis not present

## 2016-07-02 DIAGNOSIS — G4730 Sleep apnea, unspecified: Secondary | ICD-10-CM | POA: Insufficient documentation

## 2016-07-02 DIAGNOSIS — E785 Hyperlipidemia, unspecified: Secondary | ICD-10-CM

## 2016-07-02 DIAGNOSIS — D696 Thrombocytopenia, unspecified: Secondary | ICD-10-CM | POA: Diagnosis not present

## 2016-07-02 DIAGNOSIS — I2510 Atherosclerotic heart disease of native coronary artery without angina pectoris: Secondary | ICD-10-CM | POA: Diagnosis not present

## 2016-07-02 DIAGNOSIS — E039 Hypothyroidism, unspecified: Secondary | ICD-10-CM | POA: Insufficient documentation

## 2016-07-02 DIAGNOSIS — I129 Hypertensive chronic kidney disease with stage 1 through stage 4 chronic kidney disease, or unspecified chronic kidney disease: Secondary | ICD-10-CM | POA: Insufficient documentation

## 2016-07-02 DIAGNOSIS — M4800 Spinal stenosis, site unspecified: Secondary | ICD-10-CM | POA: Insufficient documentation

## 2016-07-02 DIAGNOSIS — Z7982 Long term (current) use of aspirin: Secondary | ICD-10-CM | POA: Diagnosis not present

## 2016-07-02 DIAGNOSIS — I700 Atherosclerosis of aorta: Secondary | ICD-10-CM

## 2016-07-02 DIAGNOSIS — C155 Malignant neoplasm of lower third of esophagus: Secondary | ICD-10-CM | POA: Diagnosis present

## 2016-07-02 DIAGNOSIS — R1310 Dysphagia, unspecified: Secondary | ICD-10-CM

## 2016-07-02 DIAGNOSIS — D72819 Decreased white blood cell count, unspecified: Secondary | ICD-10-CM | POA: Insufficient documentation

## 2016-07-02 DIAGNOSIS — I499 Cardiac arrhythmia, unspecified: Secondary | ICD-10-CM | POA: Insufficient documentation

## 2016-07-02 DIAGNOSIS — K921 Melena: Secondary | ICD-10-CM

## 2016-07-02 DIAGNOSIS — R918 Other nonspecific abnormal finding of lung field: Secondary | ICD-10-CM

## 2016-07-02 DIAGNOSIS — N400 Enlarged prostate without lower urinary tract symptoms: Secondary | ICD-10-CM | POA: Insufficient documentation

## 2016-07-02 DIAGNOSIS — Z79899 Other long term (current) drug therapy: Secondary | ICD-10-CM | POA: Insufficient documentation

## 2016-07-02 DIAGNOSIS — R001 Bradycardia, unspecified: Secondary | ICD-10-CM

## 2016-07-02 DIAGNOSIS — K4090 Unilateral inguinal hernia, without obstruction or gangrene, not specified as recurrent: Secondary | ICD-10-CM

## 2016-07-02 DIAGNOSIS — R112 Nausea with vomiting, unspecified: Secondary | ICD-10-CM

## 2016-07-02 DIAGNOSIS — K219 Gastro-esophageal reflux disease without esophagitis: Secondary | ICD-10-CM | POA: Insufficient documentation

## 2016-07-02 DIAGNOSIS — F419 Anxiety disorder, unspecified: Secondary | ICD-10-CM

## 2016-07-02 DIAGNOSIS — E860 Dehydration: Secondary | ICD-10-CM

## 2016-07-02 DIAGNOSIS — Z510 Encounter for antineoplastic radiation therapy: Secondary | ICD-10-CM | POA: Insufficient documentation

## 2016-07-02 DIAGNOSIS — K5730 Diverticulosis of large intestine without perforation or abscess without bleeding: Secondary | ICD-10-CM

## 2016-07-02 DIAGNOSIS — M48061 Spinal stenosis, lumbar region without neurogenic claudication: Secondary | ICD-10-CM

## 2016-07-02 NOTE — Progress Notes (Signed)
START ON PATHWAY REGIMEN - Gastroesophageal  [id number]: Carboplatin + Paclitaxel (2/50) Weekly (x 5-6 Weeks) with Concurrent RT   Administer weekly during RT:     Paclitaxel (Taxol(R)) 50 mg/m2 in 250 mL NS IV over 60 minutes weekly with concurrent RT Dose Mod: None     Carboplatin (Paraplatin(R)) AUC=2 in 100 mL NS IV over 30 minutes weekly with concurrent RT Dose Mod: None Additional Orders: Given with concurrent chemoradiotherapy. GCSF therapy with RT is a relative contraindication.  **Always confirm dose/schedule in your pharmacy ordering system**    Patient Characteristics: [place], Adenocarcinoma, T3 or Higher or N+, Surgical Candidate - Preoperative Therapy Histology: Adenocarcinoma Disease Classification: [place] AJCC T Stage: 3 AJCC M Stage: 0 AJCC N Stage: 0 AJCC Stage Grouping: IIB Current Disease Status: No Distant Mets or Local Recurrence Tumor Grade: X Patient Characteristics: Surgical Candidate - Preoperative Therapy  Intent of Therapy: Curative Intent, Discussed with Patient

## 2016-07-02 NOTE — Progress Notes (Signed)
Written and verbal information given regarding side effects, lab appointments and hygiene.  Patient, wife and daughter verbalized understanding of all information; questions answered to their satisfaction.

## 2016-07-02 NOTE — Progress Notes (Signed)
  Oncology Nurse Navigator Documentation Attended consult with Dr Baruch Gouty today for initial Rad Onc. Notified that his appt with Dr Elenor Quinones has been scheduled for 10/31. They were called yesterday and unable to take the call. Plan for PET 10/18, and simulation 10/19. Currently meeting with dietician, Joli for nutritional needs. Chemo planning with Dr [name] to follow.  Navigator Location: [facility] (07/02/16 1200) Navigator Encounter Type: Initial RadOnc (07/02/16 1200)             Treatment Phase: Pre-Tx/Tx Discussion (07/02/16 1200)                            Time Spent with Patient: 90 (07/02/16 1200)

## 2016-07-02 NOTE — Consult Note (Signed)
NEW PATIENT EVALUATION  Name: Corey Perry  MRN: [id number]  Date:   07/02/2016     DOB: [birth year]   This 70 y.o. 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  REFERRING PHYSICIAN: [name], MD  CHIEF COMPLAINT:  Chief Complaint  Patient presents with  . Esophageal Cancer    Initial Consult    DIAGNOSIS: The encounter diagnosis was Malignant neoplasm of lower third of esophagus ([place]).   PREVIOUS INVESTIGATIONS:  CT scans reviewed PET CT scan ordered Pathology reports reviewed Clinical notes reviewed  HPI: Patient is a 70 year old male who has been followed by medical oncology for leukopenia and thrombocytopenia for the past year. He is slowly developed progressive dysphagia with regurgitation of his food. His weight has remained relatively stable. He underwent upper endoscopy and was found to have an obstructing mass in the distal esophagus 39 cm from the incisors. Biopsy was positive for poorly differentiated adenocarcinoma. Anoscopic ultrasound showed partially circumferential mass with sonographic evidence suggesting invasion into the adventitia layer 5. There was one abnormal lymph node visualized in the lower paraesophageal mediastinum. Lesion was staged a T3 N0. Patient has been set up for evaluation at [facility] for surgery. He is seen today for consideration of preoperative chemoradiation. He continues have problems with dysphasia. No specific abdominal pain.  PLANNED TREATMENT REGIMEN: Chemoradiation in preoperative mode  PAST MEDICAL HISTORY:  has a past medical history of Abnormal white blood cell (WBC) count; BPH (benign prostatic hypertrophy); Bradycardia; CAD (coronary artery disease); Chronic kidney disease; Dysrhythmia; Esophageal cancer ([place]) (05/2016); GERD (gastroesophageal reflux disease); Hyperlipidemia; Hypertension; Hypothyroidism; Sleep apnea; and Spinal stenosis.    PAST  SURGICAL HISTORY:  Past Surgical History:  Procedure Laterality Date  . BACK SURGERY    . CARDIAC CATHETERIZATION Left 03/12/2016   Procedure: Left Heart Cath and Coronary Angiography;  Surgeon: [name], MD;  Location: [place] [facility];  Service: Cardiovascular;  Laterality: Left;  . CARDIAC CATHETERIZATION N/A 03/12/2016   Procedure: Coronary Stent Intervention;  Surgeon: Yolonda Kida, MD;  Location: [place] [facility];  Service: Cardiovascular;  Laterality: N/A;  . COLONOSCOPY WITH PROPOFOL N/A 07/13/2015   Procedure: COLONOSCOPY WITH PROPOFOL;  Surgeon: Lucilla Lame, MD;  Location: [place];  Service: Endoscopy;  Laterality: N/A;  . CORONARY ANGIOPLASTY  10/05 and 12/05   4 DES placed  . ESOPHAGOGASTRODUODENOSCOPY (EGD) WITH PROPOFOL N/A 06/14/2016   Procedure: ESOPHAGOGASTRODUODENOSCOPY (EGD) WITH PROPOFOL;  Surgeon: [name], MD;  Location: [facility] ENDOS[facility];  Service: Endoscopy;  Laterality: N/A;  . HERNIA REPAIR     x3  . POLYPECTOMY  07/13/2015   Procedure: POLYPECTOMY;  Surgeon: Lucilla Lame, MD;  Location: [place];  Service: Endoscopy;;    FAMILY HISTORY: family history includes Diabetes in his son; Heart disease in his father; Hypertension in his brother, father, and mother; Kidney disease in his father.  SOCIAL HISTORY:  reports that he quit smoking about 11 years ago. His smoking use included Cigarettes. He has a 40.00 pack-year smoking history. He has quit using smokeless tobacco. His smokeless tobacco use included Chew. He reports that he does not drink alcohol or use drugs.  ALLERGIES: Review of patient's allergies indicates no known allergies.  MEDICATIONS:  Current Outpatient Prescriptions  Medication Sig Dispense Refill  . Ascorbic Acid (VITAMIN C) 1000 MG tablet Take 1,000 mg by mouth daily.    Marland Kitchen aspirin 81 MG tablet  Take 81 mg by mouth daily. AM    . docusate (COLACE) 60 MG/15ML syrup Take 15 mLs (60 mg total) by mouth  daily. 200 mL 0  . hydrocortisone (ANUSOL-HC) 2.5 % rectal cream Apply rectally 2 times daily 30 g 1  . isosorbide mononitrate (IMDUR) 30 MG 24 hr tablet 30 mg daily. AM  5  . levothyroxine (SYNTHROID, LEVOTHROID) 50 MCG tablet Take 1 tablet (50 mcg total) by mouth daily. 90 tablet 4  . lisinopril (PRINIVIL,ZESTRIL) 10 MG tablet Take 1 tablet (10 mg total) by mouth daily. 90 tablet 4  . multivitamin-iron-minerals-folic acid (CENTRUM) chewable tablet Chew 1 tablet by mouth daily.    . nitroGLYCERIN (NITROSTAT) 0.4 MG SL tablet Place 1 tablet under the tongue every 5 (five) minutes as needed.  98  . pantoprazole (PROTONIX) 20 MG tablet Take 1 tablet (20 mg total) by mouth daily. 90 tablet 4  . simvastatin (ZOCOR) 40 MG tablet Take 1 tablet (40 mg total) by mouth daily. 90 tablet 4  . ticagrelor (BRILINTA) 90 MG TABS tablet Take 1 tablet by mouth daily.    . vitamin E (VITAMIN E) 400 UNIT capsule Take 400 Units by mouth daily.     No current facility-administered medications for this encounter.     ECOG PERFORMANCE STATUS:  1 - Symptomatic but completely ambulatory  REVIEW OF SYSTEMS: Except for the dysphasia and regurgitation Patient denies any weight loss, fatigue, weakness, fever, chills or night sweats. Patient denies any loss of vision, blurred vision. Patient denies any ringing  of the ears or hearing loss. No irregular heartbeat. Patient denies heart murmur or history of fainting. Patient denies any chest pain or pain radiating to her upper extremities. Patient denies any shortness of breath, difficulty breathing at night, cough or hemoptysis. Patient denies any swelling in the lower legs. Patient denies any nausea vomiting, vomiting of blood, or coffee ground material in the vomitus. Patient denies any stomach pain. Patient states has had normal bowel movements no significant constipation or diarrhea. Patient denies any dysuria, hematuria or significant nocturia. Patient denies any problems  walking, swelling in the joints or loss of balance. Patient denies any skin changes, loss of hair or loss of weight. Patient denies any excessive worrying or anxiety or significant depression. Patient denies any problems with insomnia. Patient denies excessive thirst, polyuria, polydipsia. Patient denies any swollen glands, patient denies easy bruising or easy bleeding. Patient denies any recent infections, allergies or URI. Patient "s visual fields have not changed significantly in recent time.    PHYSICAL EXAM: BP 135/74   Pulse (!) 47   Temp 98 F (36.7 C)   Wt 207 lb 7.3 oz (94.1 kg)   BMI 29.55 kg/m  No evidence of cervical or supraclavicular adenopathy is noted. Well-developed well-nourished patient in NAD. HEENT reveals PERLA, EOMI, discs not visualized.  Oral cavity is clear. No oral mucosal lesions are identified. Neck is clear without evidence of cervical or supraclavicular adenopathy. Lungs are clear to A&P. Cardiac examination is essentially unremarkable with regular rate and rhythm without murmur rub or thrill. Abdomen is benign with no organomegaly or masses noted. Motor sensory and DTR levels are equal and symmetric in the upper and lower extremities. Cranial nerves II through XII are grossly intact. Proprioception is intact. No peripheral adenopathy or edema is identified. No motor or sensory levels are noted. Crude visual fields are within normal range.  LABORATORY DATA: Pathology reports reviewed    RADIOLOGY RESULTS: CT scans  reviewed PET CT scan has been ordered   IMPRESSION: Stage IIB adenocarcinoma of the distal esophagus in 70 year old male  PLAN: At this time I have recommended chemoradiation in the preoperative setting. Would plan on delivering 5000 cGy using I MRT radiation therapy to the areas of distal esophageal involvement of his adenocarcinoma. I've ordered a PET CT scan for later this week and will CT symptoms him shortly thereafter. Would plan on delivering 5000  cGy over 5 weeks. Risks and benefits of treatment including increased dysphasia alteration of blood counts skin reaction fatigue and possible nausea all were discussed in detail with the patient and his family. I personally set up and ordered CT simulation after his PET/CT scan. Patient will continue his appointments at [facility] with surgical oncologist in anticipation of surgery after chemoradiation.There will be extra effort by both professional staff as well as technical staff to coordinate and manage concurrent chemoradiation and ensuing side effects during his treatments. Patient also may benefit from a feeding tube which we discussed and that will be decided in the near future depending on his ability to continue to swallow appropriately.  I would like to take this opportunity to thank you for allowing me to participate in the care of your patient.Armstead Peaks., MD

## 2016-07-02 NOTE — Progress Notes (Signed)
  Reason for Assessment: Referral for nutrition counseling and possible feeding tube  ASSESSMENT:  70 y.o male with new diagnosis of stage IIb adenocarcinoma of lower third esophagus.  Planning chemotherapy and radiation therapy followed by esophagectomy.     Pt with history of HTN, CAD, angina, GERD, CKD stage III, hypothyroid, HLD, cardiac stents  Food/Nutrition History: Pt reports for the last 2 months intake has decreased due to unable for foods to pass through esophagus.  Has been limited to eating soups, Boost 2 times per day, mashed potatoes (soupy), pudding, thin oatmeal, cheerios.  Has to mash foods really fine in order for them to stay down.  Wife and daughter present with pt during visit. Noted pediatric scope was used for EGD.  Medications: Vit C, colace, MVI with Fe, Vit E  Labs: Na 133, glucose 104, BUN 25, creatinine 1.43   Ht: 70 "  Wt: 209 pounds with shoes on today in clinic   4% wt loss in the last 3 months  UBW: 218 pounds (about 3 months ago)  BMI: 29.4  Nutrition-Focused physical exam completed. Findings are WDL for fat depletion, muscle depletion, and edema.    Estimated Energy Needs  Kcals: 3552 kcals/d Protein: 94-112 g/d Fluid: 237m/d  NUTRITION DIAGNOSIS: Inadequate oral nutrition intake related to cancer and cancer related illness as evidenced by 4% wt loss and ability to only eat liquid foods.  GOAL: Pt to meet at least 90% of estimated nutritional needs.  MONITOR: Wt trends, oral intake, decision regarding feeding tube placement  INTERVENTION:  1) Discussed easy to chew and swallow foods, liquid diet and ways to increase calories and protein.  Handouts and recipes provided to pt.   2) Discussed J-tube vs G-tube feeding tube in this patient population with MD FGrayland Ormondas not to interfere with need for esophagectomy in the future.  Also discussed feeding tube with pt and family, continuous nutrition for number of hours (ie ?? 12-14 hour or more)  during the day.  Bolus feeding not an option with J tube.  Pt and family to think about feeding tube.  Discussed that pt would still be able to eat orally as desired   NEXT VISIT: once decision has been made to proceed with feeding tube  Selenne Coggin B. AZenia Resides REl Dorado LRamsey(pager) Weekend/On-Call pager ([phone]

## 2016-07-02 NOTE — Patient Instructions (Signed)

## 2016-07-03 ENCOUNTER — Telehealth: Payer: Self-pay

## 2016-07-03 ENCOUNTER — Encounter
Admission: RE | Admit: 2016-07-03 | Discharge: 2016-07-03 | Disposition: A | Discharge status: Home / Self Care | Payer: Medicare Other | Source: Ambulatory Visit | Attending: Radiation Oncology | Admitting: Radiation Oncology

## 2016-07-03 DIAGNOSIS — C155 Malignant neoplasm of lower third of esophagus: Secondary | ICD-10-CM | POA: Insufficient documentation

## 2016-07-03 LAB — GLUCOSE, CAPILLARY: Glucose-Capillary: 100 mg/dL — ABNORMAL HIGH (ref 65–99)

## 2016-07-03 MED ORDER — FLUDEOXYGLUCOSE F - 18 (FDG) INJECTION
12.0900 | Freq: Once | INTRAVENOUS | Status: AC | PRN
  Administered 2016-07-03: 12.09 via INTRAVENOUS

## 2016-07-03 NOTE — Telephone Encounter (Signed)
Patient was called back and told to hold Brilinta and ASA until after surgery. Patient's wife verbalized understanding.

## 2016-07-03 NOTE — Telephone Encounter (Signed)
Received notification from Tillie Rung (Nurse) at [facility] that patient will need a port placement prior to beginning Chemo on 07/09/16.   Spoke with Dr. Adonis Huguenin in regards to this. We will see patient tomorrow in clinic and case will be booked for Dr. Hampton Abbot on 07/08/16.   Spoke with patient and patient's wife. They are in agreement with this plan. Patient placed on clinic schedule for tomorrow and placed on OR schedule for 07/08/16.  Please send surgery sheet over.

## 2016-07-04 ENCOUNTER — Ambulatory Visit
Admission: RE | Admit: 2016-07-04 | Discharge: 2016-07-04 | Disposition: A | Discharge status: Home / Self Care | Payer: Medicare Other | Source: Ambulatory Visit | Attending: Radiation Oncology | Admitting: Radiation Oncology

## 2016-07-04 ENCOUNTER — Inpatient Hospital Stay: Payer: Medicare Other

## 2016-07-04 DIAGNOSIS — C155 Malignant neoplasm of lower third of esophagus: Secondary | ICD-10-CM | POA: Diagnosis not present

## 2016-07-04 NOTE — Telephone Encounter (Signed)
I have sent surgery form to the OR and will contact the patient with Pre admit information once obtained.   I have requested a pre admit time for the patient on 07/05/16 after his appointment in the [facility] with Dr Adonis Huguenin.

## 2016-07-05 ENCOUNTER — Encounter: Payer: Self-pay | Admitting: General Surgery

## 2016-07-05 ENCOUNTER — Other Ambulatory Visit: Payer: Self-pay

## 2016-07-05 ENCOUNTER — Ambulatory Visit (INDEPENDENT_AMBULATORY_CARE_PROVIDER_SITE_OTHER): Payer: Medicare Other | Admitting: General Surgery

## 2016-07-05 ENCOUNTER — Telehealth: Payer: Self-pay | Admitting: Surgery

## 2016-07-05 VITALS — BP 126/67 | HR 50 | Temp 97.9°F | Ht 69.0 in | Wt 207.0 lb

## 2016-07-05 DIAGNOSIS — Z01818 Encounter for other preprocedural examination: Secondary | ICD-10-CM

## 2016-07-05 DIAGNOSIS — C155 Malignant neoplasm of lower third of esophagus: Secondary | ICD-10-CM | POA: Diagnosis not present

## 2016-07-05 MED ORDER — PROCHLORPERAZINE MALEATE 10 MG PO TABS: 10.0000 mg | ORAL_TABLET | Freq: Four times a day (QID) | ORAL | 1 refills | Status: DC | PRN

## 2016-07-05 MED ORDER — LIDOCAINE-PRILOCAINE 2.5-2.5 % EX CREA: TOPICAL_CREAM | CUTANEOUS | 3 refills | Status: DC

## 2016-07-05 MED ORDER — ONDANSETRON HCL 8 MG PO TABS: 8.0000 mg | ORAL_TABLET | Freq: Two times a day (BID) | ORAL | 1 refills | Status: DC | PRN

## 2016-07-05 NOTE — Telephone Encounter (Signed)
Pt advised of pre op date/time and sx date. Sx: 07/08/16 with Dr Stephanie Acre placement.  Pre op: Patient will need to arrive at 8:30am the day of surgery.

## 2016-07-05 NOTE — Patient Instructions (Signed)
Please refer to your blue sheet for surgery information. Your surgery is scheduled for 07/08/2016 with Dr. Hampton Abbot. Please call our office if you have any questions or concerns

## 2016-07-05 NOTE — Progress Notes (Signed)
Patient ID: Corey Perry, male   DOB: [birth year], 70 y.o.   MRN: [id number]  CC: esophageal CA  HPI Corey Perry is a 70 y.o. male with known esophageal cancer presents to clinic today to discuss chemotherapy port placement. Patient moves recently diagnosed with esophageal cancer with a multi-month progressive worsening and inability to swallow with regurgitation of food. In spite of his difficulty with swallowing he has been able to maintain his weight. He denies any pain to his chest or his lower abdomen. He also denies any shortness of breath. He denies any fevers, chills, diarrhea, constipation. His primary complaint is of nausea and occasional vomiting. Patient is anxious due to his cancer diagnosis that his ready to get on with treating and beating the disease.  HPI  Past Medical History:  Diagnosis Date  . Abnormal white blood cell (WBC) count    seeing Dr. [name] at [place] on 10/25  . BPH (benign prostatic hypertrophy)   . Bradycardia   . CAD (coronary artery disease)   . Chronic kidney disease   . Dysrhythmia    bradycardia - sees Dr. Humphrey Rolls  . Esophageal cancer ([place]) 05/2016  . GERD (gastroesophageal reflux disease)   . Hyperlipidemia   . Hypertension   . Hypothyroidism   . Sleep apnea    mild - SS - Care everywhere -04/18/07  . Spinal stenosis    lumbar    Past Surgical History:  Procedure Laterality Date  . BACK SURGERY    . CARDIAC CATHETERIZATION Left 03/12/2016   Procedure: Left Heart Cath and Coronary Angiography;  Surgeon: [name], MD;  Location: [place] [facility];  Service: Cardiovascular;  Laterality: Left;  . CARDIAC CATHETERIZATION N/A 03/12/2016   Procedure: Coronary Stent Intervention;  Surgeon: Yolonda Kida, MD;  Location: [place] [facility];  Service: Cardiovascular;  Laterality: N/A;  . COLONOSCOPY WITH PROPOFOL N/A 07/13/2015   Procedure: COLONOSCOPY WITH PROPOFOL;  Surgeon: Lucilla Lame, MD;  Location: [place];   Service: Endoscopy;  Laterality: N/A;  . CORONARY ANGIOPLASTY  10/05 and 12/05   4 DES placed  . ESOPHAGOGASTRODUODENOSCOPY (EGD) WITH PROPOFOL N/A 06/14/2016   Procedure: ESOPHAGOGASTRODUODENOSCOPY (EGD) WITH PROPOFOL;  Surgeon: [name], MD;  Location: [facility] ENDOSCOPY;  Service: Endoscopy;  Laterality: N/A;  . HERNIA REPAIR     x3  . POLYPECTOMY  07/13/2015   Procedure: POLYPECTOMY;  Surgeon: Lucilla Lame, MD;  Location: [place];  Service: Endoscopy;;    Family History  Problem Relation Age of Onset  . Hypertension Mother   . Heart disease Father   . Hypertension Father   . Kidney disease Father   . Hypertension Brother   . Diabetes Son     Social History Social History  Substance Use Topics  . Smoking status: Former Smoker    Packs/day: 1.00    Years: 40.00    Types: Cigarettes    Quit date: 07/16/2004  . Smokeless tobacco: Former Systems developer    Types: Chew  . Alcohol use No    No Known Allergies  Current Outpatient Prescriptions  Medication Sig Dispense Refill  . Ascorbic Acid (VITAMIN C) 1000 MG tablet Take 1,000 mg by mouth daily.    Marland Kitchen aspirin 81 MG tablet Take 81 mg by mouth daily. AM    . docusate (COLACE) 60 MG/15ML syrup Take 15 mLs (60 mg total) by mouth daily. 200 mL 0  . hydrocortisone (ANUSOL-HC) 2.5 % rectal cream Apply rectally 2  times daily 30 g 1  . isosorbide mononitrate (IMDUR) 30 MG 24 hr tablet 30 mg daily. AM  5  . levothyroxine (SYNTHROID, LEVOTHROID) 50 MCG tablet Take 1 tablet (50 mcg total) by mouth daily. 90 tablet 4  . lisinopril (PRINIVIL,ZESTRIL) 10 MG tablet Take 1 tablet (10 mg total) by mouth daily. 90 tablet 4  . multivitamin-iron-minerals-folic acid (CENTRUM) chewable tablet Chew 1 tablet by mouth daily.    . nitroGLYCERIN (NITROSTAT) 0.4 MG SL tablet Place 1 tablet under the tongue every 5 (five) minutes as needed.  98  . pantoprazole (PROTONIX) 20 MG tablet Take 1 tablet (20 mg total) by mouth daily. 90 tablet 4  .  simvastatin (ZOCOR) 40 MG tablet Take 1 tablet (40 mg total) by mouth daily. 90 tablet 4  . ticagrelor (BRILINTA) 90 MG TABS tablet Take 1 tablet by mouth daily.    . vitamin E (VITAMIN E) 400 UNIT capsule Take 400 Units by mouth daily.     No current facility-administered medications for this visit.      Review of Systems A Multi-point review of systems was asked and was negative except for the findings documented in the history of present illness  Physical Exam Blood pressure 126/67, pulse (!) 50, temperature 97.9 F (36.6 C), temperature source Oral, height '5\' 9"'$  (1.753 m), weight 93.9 kg (207 lb). CONSTITUTIONAL: No acute distress. EYES: Pupils are equal, round, and reactive to light, Sclera are non-icteric. EARS, NOSE, MOUTH AND THROAT: The oropharynx is clear. The oral mucosa is pink and moist. Hearing is intact to voice. LYMPH NODES:  Lymph nodes in the neck are normal. RESPIRATORY:  Lungs are clear. There is normal respiratory effort, with equal breath sounds bilaterally, and without pathologic use of accessory muscles. CARDIOVASCULAR: Heart is regular without murmurs, gallops, or rubs. GI: The abdomen is soft, nontender, and nondistended. There are no palpable masses. There is no hepatosplenomegaly. There are normal bowel sounds in all quadrants. GU: Rectal deferred.   MUSCULOSKELETAL: Normal muscle strength and tone. No cyanosis or edema.   SKIN: Turgor is good and there are no pathologic skin lesions or ulcers. NEUROLOGIC: Motor and sensation is grossly normal. Cranial nerves are grossly intact. PSYCH:  Oriented to person, place and time. Affect is normal.  Data Reviewed Images and labs reviewed. Labs show a mild leukopenia at 3.2, a mild thrombocytopenia at 112, mild renal insufficiency with a creatinine of 1.43, chloride is low at 99, sodium is low 133. There are no coags currently review. Imaging shows evidence of his distal esophageal cancer. There is no current evidence of  metastatic spread. I have personally reviewed the patient's imaging, laboratory findings and medical records.    Assessment    Esophageal cancer    Plan    70 year old male with esophageal cancer. Patient in clinic today to discuss port placement so that he can begin chemotherapy. Discussed the procedure of a port placement in detail to include the risks, benefits, alternatives. Specifically the risks discussed were pain, bleeding, infection, damage to the artery, pneumothorax, possible need for chest tube, possible need for inpatient stay versus additional surgical therapy for any of the above, complications. Patient voiced understanding and desired to proceed. He is currently scheduled for port placement on Monday morning so that he can begin chemotherapy on Tuesday. Discussed at length of this will likely be performed by my partner Dr. Hampton Abbot and they voiced understanding..    Time spent with the patient was 45 minutes, with more  than 50% of the time spent in face-to-face education, counseling and care coordination.     Clayburn Pert, MD FACS General Surgeon 07/05/2016, 10:45 AM

## 2016-07-08 ENCOUNTER — Ambulatory Visit: Payer: Medicare Other

## 2016-07-08 ENCOUNTER — Ambulatory Visit: Payer: Medicare Other | Admitting: Anesthesiology

## 2016-07-08 ENCOUNTER — Encounter: Payer: Self-pay | Admitting: *Deleted

## 2016-07-08 ENCOUNTER — Inpatient Hospital Stay: Payer: Medicare Other

## 2016-07-08 ENCOUNTER — Encounter
Admission: RE | Disposition: A | Discharge status: Home / Self Care | Payer: Self-pay | Source: Ambulatory Visit | Attending: Surgery

## 2016-07-08 ENCOUNTER — Inpatient Hospital Stay: Payer: Medicare Other | Admitting: Oncology

## 2016-07-08 ENCOUNTER — Ambulatory Visit
Admission: RE | Admit: 2016-07-08 | Discharge: 2016-07-08 | Disposition: A | Discharge status: Home / Self Care | Payer: Medicare Other | Source: Ambulatory Visit | Attending: Surgery | Admitting: Surgery

## 2016-07-08 DIAGNOSIS — Z87891 Personal history of nicotine dependence: Secondary | ICD-10-CM | POA: Insufficient documentation

## 2016-07-08 DIAGNOSIS — C159 Malignant neoplasm of esophagus, unspecified: Secondary | ICD-10-CM | POA: Insufficient documentation

## 2016-07-08 DIAGNOSIS — N189 Chronic kidney disease, unspecified: Secondary | ICD-10-CM | POA: Diagnosis not present

## 2016-07-08 DIAGNOSIS — Z79899 Other long term (current) drug therapy: Secondary | ICD-10-CM | POA: Diagnosis not present

## 2016-07-08 DIAGNOSIS — K219 Gastro-esophageal reflux disease without esophagitis: Secondary | ICD-10-CM | POA: Insufficient documentation

## 2016-07-08 DIAGNOSIS — Z955 Presence of coronary angioplasty implant and graft: Secondary | ICD-10-CM | POA: Insufficient documentation

## 2016-07-08 DIAGNOSIS — Z452 Encounter for adjustment and management of vascular access device: Secondary | ICD-10-CM | POA: Insufficient documentation

## 2016-07-08 DIAGNOSIS — Z95828 Presence of other vascular implants and grafts: Secondary | ICD-10-CM

## 2016-07-08 DIAGNOSIS — I129 Hypertensive chronic kidney disease with stage 1 through stage 4 chronic kidney disease, or unspecified chronic kidney disease: Secondary | ICD-10-CM | POA: Insufficient documentation

## 2016-07-08 DIAGNOSIS — Z7902 Long term (current) use of antithrombotics/antiplatelets: Secondary | ICD-10-CM | POA: Insufficient documentation

## 2016-07-08 DIAGNOSIS — E785 Hyperlipidemia, unspecified: Secondary | ICD-10-CM | POA: Diagnosis not present

## 2016-07-08 DIAGNOSIS — E039 Hypothyroidism, unspecified: Secondary | ICD-10-CM | POA: Insufficient documentation

## 2016-07-08 DIAGNOSIS — Z9889 Other specified postprocedural states: Secondary | ICD-10-CM | POA: Diagnosis not present

## 2016-07-08 DIAGNOSIS — I2510 Atherosclerotic heart disease of native coronary artery without angina pectoris: Secondary | ICD-10-CM | POA: Diagnosis not present

## 2016-07-08 DIAGNOSIS — N400 Enlarged prostate without lower urinary tract symptoms: Secondary | ICD-10-CM | POA: Insufficient documentation

## 2016-07-08 DIAGNOSIS — Z7982 Long term (current) use of aspirin: Secondary | ICD-10-CM | POA: Insufficient documentation

## 2016-07-08 DIAGNOSIS — G4730 Sleep apnea, unspecified: Secondary | ICD-10-CM | POA: Insufficient documentation

## 2016-07-08 HISTORY — PX: PORTACATH PLACEMENT: SHX2246

## 2016-07-08 SURGERY — INSERTION, TUNNELED CENTRAL VENOUS DEVICE, WITH PORT
Anesthesia: General

## 2016-07-08 MED ORDER — BUPIVACAINE HCL (PF) 0.5 % IJ SOLN
INTRAMUSCULAR | Status: AC
  Filled 2016-07-08: qty 30

## 2016-07-08 MED ORDER — HEPARIN SODIUM (PORCINE) 5000 UNIT/ML IJ SOLN
INTRAMUSCULAR | Status: AC
  Filled 2016-07-08: qty 1

## 2016-07-08 MED ORDER — PROPOFOL 500 MG/50ML IV EMUL
INTRAVENOUS | Status: DC | PRN
  Administered 2016-07-08: 50 ug/kg/min via INTRAVENOUS

## 2016-07-08 MED ORDER — BUPIVACAINE HCL (PF) 0.5 % IJ SOLN
INTRAMUSCULAR | Status: DC | PRN
  Administered 2016-07-08: 5.5 mL

## 2016-07-08 MED ORDER — PROPOFOL 10 MG/ML IV BOLUS
INTRAVENOUS | Status: DC | PRN
  Administered 2016-07-08 (×8): 10 mg via INTRAVENOUS

## 2016-07-08 MED ORDER — LIDOCAINE HCL (PF) 1 % IJ SOLN
INTRAMUSCULAR | Status: AC
  Filled 2016-07-08: qty 30

## 2016-07-08 MED ORDER — OXYCODONE HCL 5 MG PO TABS
5.0000 mg | ORAL_TABLET | Freq: Once | ORAL | Status: DC | PRN
Start: 2016-07-08 — End: 2016-07-08

## 2016-07-08 MED ORDER — SODIUM CHLORIDE 0.9 % IJ SOLN
INTRAMUSCULAR | Status: AC
  Filled 2016-07-08: qty 50

## 2016-07-08 MED ORDER — OXYCODONE-ACETAMINOPHEN 5-325 MG PO TABS: 1.0000 | ORAL_TABLET | Freq: Four times a day (QID) | ORAL | 0 refills | Status: DC | PRN

## 2016-07-08 MED ORDER — SODIUM CHLORIDE 0.9 % IJ SOLN
INTRAMUSCULAR | Status: AC
Start: 2016-07-08 — End: 2016-07-08
  Filled 2016-07-08: qty 50

## 2016-07-08 MED ORDER — FENTANYL CITRATE (PF) 100 MCG/2ML IJ SOLN
INTRAMUSCULAR | Status: DC | PRN
  Administered 2016-07-08: 50 ug via INTRAVENOUS
  Administered 2016-07-08: 25 ug via INTRAVENOUS

## 2016-07-08 MED ORDER — ONDANSETRON HCL 4 MG/2ML IJ SOLN
INTRAMUSCULAR | Status: DC | PRN
  Administered 2016-07-08: 4 mg via INTRAVENOUS

## 2016-07-08 MED ORDER — LACTATED RINGERS IV SOLN
INTRAVENOUS | Status: DC | PRN
  Administered 2016-07-08: 10:00:00 via INTRAVENOUS

## 2016-07-08 MED ORDER — LIDOCAINE HCL 1 % IJ SOLN
INTRAMUSCULAR | Status: DC | PRN
  Administered 2016-07-08: 5.5 mL

## 2016-07-08 MED ORDER — CEFAZOLIN SODIUM-DEXTROSE 2-4 GM/100ML-% IV SOLN
INTRAVENOUS | Status: AC
  Filled 2016-07-08: qty 100

## 2016-07-08 MED ORDER — MIDAZOLAM HCL 2 MG/2ML IJ SOLN
INTRAMUSCULAR | Status: DC | PRN
  Administered 2016-07-08 (×2): 1 mg via INTRAVENOUS

## 2016-07-08 MED ORDER — BUPIVACAINE-EPINEPHRINE (PF) 0.25% -1:200000 IJ SOLN
INTRAMUSCULAR | Status: AC
  Filled 2016-07-08: qty 30

## 2016-07-08 MED ORDER — SODIUM CHLORIDE 0.9 % IV SOLN
INTRAVENOUS | Status: DC | PRN
  Administered 2016-07-08: 9 mL via INTRAMUSCULAR

## 2016-07-08 MED ORDER — FENTANYL CITRATE (PF) 100 MCG/2ML IJ SOLN: 25.0000 ug | INTRAMUSCULAR | Status: DC | PRN

## 2016-07-08 MED ORDER — CHLORHEXIDINE GLUCONATE CLOTH 2 % EX PADS: 6.0000 | MEDICATED_PAD | Freq: Once | CUTANEOUS | Status: DC

## 2016-07-08 MED ORDER — GLYCOPYRROLATE 0.2 MG/ML IJ SOLN
INTRAMUSCULAR | Status: DC | PRN
  Administered 2016-07-08: .2 mg via INTRAVENOUS

## 2016-07-08 MED ORDER — OXYCODONE HCL 5 MG/5ML PO SOLN: 5.0000 mg | Freq: Once | ORAL | Status: DC | PRN

## 2016-07-08 MED ORDER — CEFAZOLIN SODIUM-DEXTROSE 2-4 GM/100ML-% IV SOLN
2.0000 g | INTRAVENOUS | Status: AC
  Administered 2016-07-08: 2 g via INTRAVENOUS

## 2016-07-08 SURGICAL SUPPLY — 29 items
ADH LQ OCL WTPRF AMP STRL LF (MISCELLANEOUS)
ADHESIVE MASTISOL STRL (MISCELLANEOUS) ×1 IMPLANT
CANISTER SUCT 1200ML W/VALVE (MISCELLANEOUS) ×3 IMPLANT
CHLORAPREP W/TINT 26ML (MISCELLANEOUS) ×3 IMPLANT
CLOSURE WOUND 1/2 X4 (GAUZE/BANDAGES/DRESSINGS)
COVER LIGHT HANDLE STERIS (MISCELLANEOUS) ×6 IMPLANT
DRAPE C-ARM XRAY 36X54 (DRAPES) ×3 IMPLANT
ELECT REM PT RETURN 9FT ADLT (ELECTROSURGICAL) ×3
ELECTRODE REM PT RTRN 9FT ADLT (ELECTROSURGICAL) ×1 IMPLANT
GAUZE SPONGE 4X4 12PLY STRL (GAUZE/BANDAGES/DRESSINGS) IMPLANT
GLOVE BIO SURGEON STRL SZ7.5 (GLOVE) ×3 IMPLANT
GLOVE INDICATOR 8.0 STRL GRN (GLOVE) ×3 IMPLANT
GOWN STRL REUS W/ TWL LRG LVL3 (GOWN DISPOSABLE) ×2 IMPLANT
GOWN STRL REUS W/TWL LRG LVL3 (GOWN DISPOSABLE) ×6
KIT PORT POWER 8FR ISP CVUE (Catheter) ×2 IMPLANT
KIT RM TURNOVER STRD PROC AR (KITS) ×3 IMPLANT
LABEL OR SOLS (LABEL) IMPLANT
LIQUID BAND (GAUZE/BANDAGES/DRESSINGS) ×3 IMPLANT
NDL FILTER BLUNT 18X1 1/2 (NEEDLE) ×1 IMPLANT
NEEDLE FILTER BLUNT 18X 1/2SAF (NEEDLE) ×2
NEEDLE FILTER BLUNT 18X1 1/2 (NEEDLE) ×1 IMPLANT
PACK PORT-A-CATH (MISCELLANEOUS) ×3 IMPLANT
PORT POWER 8FR ISP MICROINTR (INTRODUCER) ×3 IMPLANT
STRIP CLOSURE SKIN 1/2X4 (GAUZE/BANDAGES/DRESSINGS) IMPLANT
SUT MNCRL AB 4-0 PS2 18 (SUTURE) ×5 IMPLANT
SUT PROLENE 3 0 SH DA (SUTURE) ×6 IMPLANT
SUT VIC AB 3-0 SH 27 (SUTURE) ×6
SUT VIC AB 3-0 SH 27X BRD (SUTURE) ×1 IMPLANT
SYR 3ML LL SCALE MARK (SYRINGE) ×3 IMPLANT

## 2016-07-08 NOTE — Anesthesia Procedure Notes (Signed)
Date/Time: 07/08/2016 10:20 AM Performed by: Allean Found Pre-anesthesia Checklist: Patient identified, Emergency Drugs available, Suction available, Patient being monitored and Timeout performed Patient Re-evaluated:Patient Re-evaluated prior to inductionOxygen Delivery Method: Nasal cannula Intubation Type: IV induction Placement Confirmation: positive ETCO2 Dental Injury: Teeth and Oropharynx as per pre-operative assessment

## 2016-07-08 NOTE — H&P (View-Only) (Signed)
Patient ID: Corey Perry, male   DOB: 20-Jun-1946, 70 y.o.   MRN: [id number]  CC: esophageal CA  HPI Corey Perry is a 70 y.o. male with known esophageal cancer presents to clinic today to discuss chemotherapy port placement. Patient moves recently diagnosed with esophageal cancer with a multi-month progressive worsening and inability to swallow with regurgitation of food. In spite of his difficulty with swallowing he has been able to maintain his weight. He denies any pain to his chest or his lower abdomen. He also denies any shortness of breath. He denies any fevers, chills, diarrhea, constipation. His primary complaint is of nausea and occasional vomiting. Patient is anxious due to his cancer diagnosis that his ready to get on with treating and beating the disease.  HPI  Past Medical History:  Diagnosis Date  . Abnormal white blood cell (WBC) count    seeing Dr. [name] at [place] on 10/25  . BPH (benign prostatic hypertrophy)   . Bradycardia   . CAD (coronary artery disease)   . Chronic kidney disease   . Dysrhythmia    bradycardia - sees Dr. Humphrey Rolls  . Esophageal cancer ([place]) 05/2016  . GERD (gastroesophageal reflux disease)   . Hyperlipidemia   . Hypertension   . Hypothyroidism   . Sleep apnea    mild - SS - Care everywhere -04/18/07  . Spinal stenosis    lumbar    Past Surgical History:  Procedure Laterality Date  . BACK SURGERY    . CARDIAC CATHETERIZATION Left 03/12/2016   Procedure: Left Heart Cath and Coronary Angiography;  Surgeon: [name], MD;  Location: [place] [facility];  Service: Cardiovascular;  Laterality: Left;  . CARDIAC CATHETERIZATION N/A 03/12/2016   Procedure: Coronary Stent Intervention;  Surgeon: Yolonda Kida, MD;  Location: [place] [facility];  Service: Cardiovascular;  Laterality: N/A;  . COLONOSCOPY WITH PROPOFOL N/A 07/13/2015   Procedure: COLONOSCOPY WITH PROPOFOL;  Surgeon: Lucilla Lame, MD;  Location: [place];   Service: Endoscopy;  Laterality: N/A;  . CORONARY ANGIOPLASTY  10/05 and 12/05   4 DES placed  . ESOPHAGOGASTRODUODENOSCOPY (EGD) WITH PROPOFOL N/A 06/14/2016   Procedure: ESOPHAGOGASTRODUODENOSCOPY (EGD) WITH PROPOFOL;  Surgeon: [name], MD;  Location: [facility]COPY;  Service: Endoscopy;  Laterality: N/A;  . HERNIA REPAIR     x3  . POLYPECTOMY  07/13/2015   Procedure: POLYPECTOMY;  Surgeon: Lucilla Lame, MD;  Location: [place];  Service: Endoscopy;;    Family History  Problem Relation Age of Onset  . Hypertension Mother   . Heart disease Father   . Hypertension Father   . Kidney disease Father   . Hypertension Brother   . Diabetes Son     Social History Social History  Substance Use Topics  . Smoking status: Former Smoker    Packs/day: 1.00    Years: 40.00    Types: Cigarettes    Quit date: 07/16/2004  . Smokeless tobacco: Former Systems developer    Types: Chew  . Alcohol use No    No Known Allergies  Current Outpatient Prescriptions  Medication Sig Dispense Refill  . Ascorbic Acid (VITAMIN C) 1000 MG tablet Take 1,000 mg by mouth daily.    Marland Kitchen aspirin 81 MG tablet Take 81 mg by mouth daily. AM    . docusate (COLACE) 60 MG/15ML syrup Take 15 mLs (60 mg total) by mouth daily. 200 mL 0  . hydrocortisone (ANUSOL-HC) 2.5 % rectal cream Apply rectally 2  times daily 30 g 1  . isosorbide mononitrate (IMDUR) 30 MG 24 hr tablet 30 mg daily. AM  5  . levothyroxine (SYNTHROID, LEVOTHROID) 50 MCG tablet Take 1 tablet (50 mcg total) by mouth daily. 90 tablet 4  . lisinopril (PRINIVIL,ZESTRIL) 10 MG tablet Take 1 tablet (10 mg total) by mouth daily. 90 tablet 4  . multivitamin-iron-minerals-folic acid (CENTRUM) chewable tablet Chew 1 tablet by mouth daily.    . nitroGLYCERIN (NITROSTAT) 0.4 MG SL tablet Place 1 tablet under the tongue every 5 (five) minutes as needed.  98  . pantoprazole (PROTONIX) 20 MG tablet Take 1 tablet (20 mg total) by mouth daily. 90 tablet 4  .  simvastatin (ZOCOR) 40 MG tablet Take 1 tablet (40 mg total) by mouth daily. 90 tablet 4  . ticagrelor (BRILINTA) 90 MG TABS tablet Take 1 tablet by mouth daily.    . vitamin E (VITAMIN E) 400 UNIT capsule Take 400 Units by mouth daily.     No current facility-administered medications for this visit.      Review of Systems A Multi-point review of systems was asked and was negative except for the findings documented in the history of present illness  Physical Exam Blood pressure 126/67, pulse (!) 50, temperature 97.9 F (36.6 C), temperature source Oral, height '5\' 9"'$  (1.753 m), weight 93.9 kg (207 lb). CONSTITUTIONAL: No acute distress. EYES: Pupils are equal, round, and reactive to light, Sclera are non-icteric. EARS, NOSE, MOUTH AND THROAT: The oropharynx is clear. The oral mucosa is pink and moist. Hearing is intact to voice. LYMPH NODES:  Lymph nodes in the neck are normal. RESPIRATORY:  Lungs are clear. There is normal respiratory effort, with equal breath sounds bilaterally, and without pathologic use of accessory muscles. CARDIOVASCULAR: Heart is regular without murmurs, gallops, or rubs. GI: The abdomen is soft, nontender, and nondistended. There are no palpable masses. There is no hepatosplenomegaly. There are normal bowel sounds in all quadrants. GU: Rectal deferred.   MUSCULOSKELETAL: Normal muscle strength and tone. No cyanosis or edema.   SKIN: Turgor is good and there are no pathologic skin lesions or ulcers. NEUROLOGIC: Motor and sensation is grossly normal. Cranial nerves are grossly intact. PSYCH:  Oriented to person, place and time. Affect is normal.  Data Reviewed Images and labs reviewed. Labs show a mild leukopenia at 3.2, a mild thrombocytopenia at 112, mild renal insufficiency with a creatinine of 1.43, chloride is low at 99, sodium is low 133. There are no coags currently review. Imaging shows evidence of his distal esophageal cancer. There is no current evidence of  metastatic spread. I have personally reviewed the patient's imaging, laboratory findings and medical records.    Assessment    Esophageal cancer    Plan    70 year old male with esophageal cancer. Patient in clinic today to discuss port placement so that he can begin chemotherapy. Discussed the procedure of a port placement in detail to include the risks, benefits, alternatives. Specifically the risks discussed were pain, bleeding, infection, damage to the artery, pneumothorax, possible need for chest tube, possible need for inpatient stay versus additional surgical therapy for any of the above, complications. Patient voiced understanding and desired to proceed. He is currently scheduled for port placement on Monday morning so that he can begin chemotherapy on Tuesday. Discussed at length of this will likely be performed by my partner Dr. Hampton Abbot and they voiced understanding..    Time spent with the patient was 45 minutes, with more  than 50% of the time spent in face-to-face education, counseling and care coordination.     Clayburn Pert, MD FACS General Surgeon 07/05/2016, 10:45 AM

## 2016-07-08 NOTE — Anesthesia Preprocedure Evaluation (Addendum)
Anesthesia Evaluation  Patient identified by MRN, date of birth, ID band Patient awake    Reviewed: Allergy & Precautions, H&P , NPO status , Patient's Chart, lab work & pertinent test results  History of Anesthesia Complications Negative for: history of anesthetic complications  Airway Mallampati: III  TM Distance: <3 FB Neck ROM: limited    Dental  (+) Poor Dentition, Chipped   Pulmonary neg shortness of breath, sleep apnea , former smoker,    Pulmonary exam normal breath sounds clear to auscultation       Cardiovascular Exercise Tolerance: Good hypertension, + angina + CAD and + Cardiac Stents  (-) DOE Normal cardiovascular exam+ dysrhythmias  Rhythm:regular Rate:Normal     Neuro/Psych negative neurological ROS  negative psych ROS   GI/Hepatic negative GI ROS, Neg liver ROS, GERD  Controlled,  Endo/Other  Hypothyroidism   Renal/GU Renal disease  negative genitourinary   Musculoskeletal   Abdominal   Peds  Hematology negative hematology ROS (+)   Anesthesia Other Findings Past Medical History: No date: Abnormal white blood cell (WBC) count     Comment: seeing Dr. [name] at [facility] [place] on 10/25 No date: BPH (benign prostatic hypertrophy) No date: Bradycardia No date: CAD (coronary artery disease) No date: Chronic kidney disease No date: Dysrhythmia     Comment: bradycardia - sees Dr. Humphrey Rolls 05/2016: Esophageal cancer ([place]) No date: GERD (gastroesophageal reflux disease) No date: Hyperlipidemia No date: Hypertension No date: Hypothyroidism No date: Sleep apnea     Comment: mild - SS - Care everywhere -04/18/07 No date: Spinal stenosis     Comment: lumbar  Past Surgical History: No date: BACK SURGERY 03/12/2016: CARDIAC CATHETERIZATION Left     Comment: Procedure: Left Heart Cath and Coronary               Angiography;  Surgeon: [name], MD;                Location: [place] [facility];  Service:               Cardiovascular;  Laterality: Left; 03/12/2016: CARDIAC CATHETERIZATION N/A     Comment: Procedure: Coronary Stent Intervention;                Surgeon: Yolonda Kida, MD;  Location: [place] [facility];  Service: Cardiovascular;                Laterality: N/A; 07/13/2015: COLONOSCOPY WITH PROPOFOL N/A     Comment: Procedure: COLONOSCOPY WITH PROPOFOL;                Surgeon: Lucilla Lame, MD;  Location: [place];  Service: Endoscopy;  Laterality:              N/A; 10/05 and 12/05: CORONARY ANGIOPLASTY     Comment: 4 DES placed 06/14/2016: ESOPHAGOGASTRODUODENOSCOPY (EGD) WITH PROPOFOL N/A     Comment: Procedure: ESOPHAGOGASTRODUODENOSCOPY (EGD)               WITH PROPOFOL;  Surgeon: [name], MD;              Location: [facility] ENDOSCOPY;  Service: Endoscopy;               Laterality: N/A; No date: HERNIA REPAIR     Comment: x3 07/13/2015: POLYPECTOMY  Comment: Procedure: POLYPECTOMY;  Surgeon: Lucilla Lame,              MD;  Location: [place];  Service:               Endoscopy;;  BMI    Body Mass Index:  30.57 kg/m      Reproductive/Obstetrics negative OB ROS                             Anesthesia Physical Anesthesia Plan  ASA: IV  Anesthesia Plan: General and MAC   Post-op Pain Management:    Induction:   Airway Management Planned:   Additional Equipment:   Intra-op Plan:   Post-operative Plan:   Informed Consent: I have reviewed the patients History and Physical, chart, labs and discussed the procedure including the risks, benefits and alternatives for the proposed anesthesia with the patient or authorized representative who has indicated his/her understanding and acceptance.   Dental Advisory Given  Plan Discussed with: Anesthesiologist, CRNA and Surgeon  Anesthesia Plan Comments: (Patient reports having chest pain on 07/06/16 that resolved with nitroglycerin.   No chest pain today or yesterday.  He says that he has chest pain like this every couple of months.  Phone discussion with the patients cardiologist Dr. Humphrey Rolls involving this patients cardiac health, cardiac stent and anticoagulation.  Dr. Chancy Milroy feels that it is safe to proceed with the procedure.  He would like the patient to follow up with him in the clinic this week.  Patient and wife voiced understanding.  Patient and wife informed that he is higher risk for complications from anesthesia during this procedure due to his medical history including but no limited to MI leading to death today or in the next month.  They voiced understanding. )       Anesthesia Quick Evaluation

## 2016-07-08 NOTE — Anesthesia Postprocedure Evaluation (Signed)
Anesthesia Post Note  Patient: Corey Perry  Procedure(s) Performed: Procedure(s) (LRB): INSERTION PORT-A-CATH (N/A)  Patient location during evaluation: PACU Anesthesia Type: General Level of consciousness: awake and alert Pain management: pain level controlled Vital Signs Assessment: post-procedure vital signs reviewed and stable Respiratory status: spontaneous breathing, nonlabored ventilation, respiratory function stable and patient connected to nasal cannula oxygen Cardiovascular status: blood pressure returned to baseline and stable Postop Assessment: no signs of nausea or vomiting Anesthetic complications: no    Last Vitals:  Vitals:   07/08/16 1252 07/08/16 1312  BP: (!) 155/75 139/75  Pulse: (!) 52 (!) 50  Resp: 16 16  Temp: 37.1 C     Last Pain:  Vitals:   07/08/16 1312  TempSrc:   PainSc: 0-No pain                 Precious Haws [place]it[place]

## 2016-07-08 NOTE — Progress Notes (Signed)
[place]  [phone]) [phone] Fax:(336) [phone]  ID: Corey Perry OB: Mar 23, 1946  MR#: [id number]  [facility]#:[id number]  Patient Care Team: [name], MD as PCP - General (Family Medicine) [name], MD as PCP - Family Medicine (Family Medicine) [name], MD (Specialist) [name], MD as Referring Physician (Internal Medicine) [name], MD as Consulting Physician (Neurosurgery)  CHIEF COMPLAINT: Stage IIb adenocarcinoma of the lower third of the esophagus.  INTERVAL HISTORY: Patient returns to clinic today for further evaluation and consideration of cycle 1 of carboplatinum and Taxol. He will initiate concurrent XRT within the next week. He continues to have difficulty swallowing accompanied with regurgitation of food. He even states he has occasional difficulty swallowing water. Despite this, his weight has remained relatively stable. He continues to be anxious, but otherwise feels well. He denies any recent fevers or illnesses. He has no neurologic complaints. He denies any chest pain or shortness of breath. He denies any constipation or diarrhea. He has no urinary complaints. Patient offers no further specific complaints today.  REVIEW OF SYSTEMS:   Review of Systems  Constitutional: Negative.  Negative for fever, malaise/fatigue and weight loss.  HENT: Negative.   Respiratory: Negative.  Negative for cough and shortness of breath.   Cardiovascular: Negative.  Negative for chest pain and leg swelling.  Gastrointestinal: Positive for vomiting. Negative for abdominal pain, blood in stool, melena and nausea.  Genitourinary: Negative.   Musculoskeletal: Negative.   Neurological: Negative.  Negative for weakness.  Psychiatric/Behavioral: The patient is nervous/anxious.     As per HPI. Otherwise, a complete review of systems is negative.  PAST MEDICAL HISTORY: Past Medical History:  Diagnosis Date  . Abnormal white blood cell (WBC)  count    seeing Dr. [name] at [place] on 10/25  . BPH (benign prostatic hypertrophy)   . Bradycardia   . CAD (coronary artery disease)   . Chronic kidney disease   . Dysrhythmia    bradycardia - sees Dr. Humphrey Rolls  . Esophageal cancer ([place]) 05/2016  . GERD (gastroesophageal reflux disease)   . Hyperlipidemia   . Hypertension   . Hypothyroidism   . Sleep apnea    mild - SS - Care everywhere -04/18/07  . Spinal stenosis    lumbar    PAST SURGICAL HISTORY: Past Surgical History:  Procedure Laterality Date  . BACK SURGERY    . CARDIAC CATHETERIZATION Left 03/12/2016   Procedure: Left Heart Cath and Coronary Angiography;  Surgeon: [name], MD;  Location: [facility];  Service: Cardiovascular;  Laterality: Left;  . CARDIAC CATHETERIZATION N/A 03/12/2016   Procedure: Coronary Stent Intervention;  Surgeon: Yolonda Kida, MD;  Location: [facility];  Service: Cardiovascular;  Laterality: N/A;  . COLONOSCOPY WITH PROPOFOL N/A 07/13/2015   Procedure: COLONOSCOPY WITH PROPOFOL;  Surgeon: Lucilla Lame, MD;  Location: [facility];  Service: Endoscopy;  Laterality: N/A;  . CORONARY ANGIOPLASTY  10/05 and 12/05   4 DES placed  . ESOPHAGOGASTRODUODENOSCOPY (EGD) WITH PROPOFOL N/A 06/14/2016   Procedure: ESOPHAGOGASTRODUODENOSCOPY (EGD) WITH PROPOFOL;  Surgeon: [name], MD;  Location: [facility];  Service: Endoscopy;  Laterality: N/A;  . HERNIA REPAIR     x3  . POLYPECTOMY  07/13/2015   Procedure: POLYPECTOMY;  Surgeon: Lucilla Lame, MD;  Location: [place];  Service: Endoscopy;;  . PORTACATH PLACEMENT N/A 07/08/2016   Procedure: INSERTION PORT-A-CATH;  Surgeon: Olean Ree, MD;  Location: [facility];  Service: General;  Laterality: N/A;    FAMILY HISTORY Family History  Problem Relation Age of Onset  . Hypertension Mother   . Heart disease Father   . Hypertension Father   . Kidney disease Father   . Hypertension Brother   . Diabetes  Son        ADVANCED DIRECTIVES:    HEALTH MAINTENANCE: Social History  Substance Use Topics  . Smoking status: Former Smoker    Packs/day: 1.00    Years: 40.00    Types: Cigarettes    Quit date: 07/16/2004  . Smokeless tobacco: Former Systems developer    Types: Chew  . Alcohol use No     Colonoscopy:  PAP:  Bone density:  Lipid panel:  No Known Allergies  Current Outpatient Prescriptions  Medication Sig Dispense Refill  . Ascorbic Acid (VITAMIN C) 1000 MG tablet Take 1,000 mg by mouth daily.    Marland Kitchen aspirin 81 MG tablet Take 81 mg by mouth daily. AM    . docusate (COLACE) 60 MG/15ML syrup Take 15 mLs (60 mg total) by mouth daily. 200 mL 0  . hydrocortisone (ANUSOL-HC) 2.5 % rectal cream Apply rectally 2 times daily 30 g 1  . isosorbide mononitrate (IMDUR) 30 MG 24 hr tablet 30 mg daily. AM  5  . levothyroxine (SYNTHROID, LEVOTHROID) 50 MCG tablet Take 1 tablet (50 mcg total) by mouth daily. 90 tablet 4  . lidocaine-prilocaine (EMLA) cream Apply to affected area once 30 g 3  . lisinopril (PRINIVIL,ZESTRIL) 10 MG tablet Take 1 tablet (10 mg total) by mouth daily. 90 tablet 4  . multivitamin-iron-minerals-folic acid (CENTRUM) chewable tablet Chew 1 tablet by mouth daily.    . nitroGLYCERIN (NITROSTAT) 0.4 MG SL tablet Place 1 tablet under the tongue every 5 (five) minutes as needed.  98  . ondansetron (ZOFRAN) 8 MG tablet Take 1 tablet (8 mg total) by mouth 2 (two) times daily as needed for refractory nausea / vomiting. 30 tablet 1  . oxyCODONE-acetaminophen (ROXICET) 5-325 MG tablet Take 1 tablet by mouth every 6 (six) hours as needed for moderate pain or severe pain. 15 tablet 0  . pantoprazole (PROTONIX) 20 MG tablet Take 1 tablet (20 mg total) by mouth daily. 90 tablet 4  . prochlorperazine (COMPAZINE) 10 MG tablet Take 1 tablet (10 mg total) by mouth every 6 (six) hours as needed (Nausea or vomiting). 30 tablet 1  . simvastatin (ZOCOR) 40 MG tablet Take 1 tablet (40 mg total) by mouth  daily. 90 tablet 4  . ticagrelor (BRILINTA) 90 MG TABS tablet Take 1 tablet by mouth daily.    . vitamin E (VITAMIN E) 400 UNIT capsule Take 400 Units by mouth daily.     No current facility-administered medications for this visit.     OBJECTIVE: Vitals:   07/09/16 0913  BP: 139/73  Pulse: (!) 47  Resp: 18  Temp: 97.1 F (36.2 C)     Body mass index is 30.68 kg/m.    ECOG FS:0 - Asymptomatic  General: Well-developed, well-nourished, no acute distress. Eyes: Pink conjunctiva, anicteric sclera. Lungs: Clear to auscultation bilaterally. Heart: Regular rate and rhythm. No rubs, murmurs, or gallops. Abdomen: Soft, nontender, nondistended. No organomegaly noted, normoactive bowel sounds. Musculoskeletal: No edema, cyanosis, or clubbing. Neuro: Alert, answering all questions appropriately. Cranial nerves grossly intact. Skin: No rashes or petechiae noted. Psych: Normal affect.   LAB RESULTS:  Lab Results  Component Value Date   NA 132 (L) 07/09/2016   K  4.4 07/09/2016   CL 101 07/09/2016   CO2 25 07/09/2016   GLUCOSE 121 (H) 07/09/2016   BUN 22 (H) 07/09/2016   CREATININE 1.42 (H) 07/09/2016   CALCIUM 8.8 (L) 07/09/2016   PROT 7.2 07/09/2016   ALBUMIN 4.4 07/09/2016   AST 26 07/09/2016   ALT 25 07/09/2016   ALKPHOS 42 07/09/2016   BILITOT 0.8 07/09/2016   GFRNONAA 49 (L) 07/09/2016   GFRAA 57 (L) 07/09/2016    Lab Results  Component Value Date   WBC 2.8 (L) 07/09/2016   NEUTROABS 1.8 07/09/2016   HGB 12.9 (L) 07/09/2016   HCT 36.7 (L) 07/09/2016   MCV 98.8 07/09/2016   PLT 92 (L) 07/09/2016     STUDIES: Dg Chest 1 View  Result Date: 07/08/2016 CLINICAL DATA:  Port-A-Cath placement. EXAM: CHEST 1 VIEW COMPARISON:  None. FINDINGS: The left subclavian Port-A-Cath tip is 7 cm above the carina and likely just into the right atrium. No pneumothorax or hematoma is identified. The lungs are clear. No pleural effusion. IMPRESSION: Left subclavian Port-A-Cath tip is  likely just below the cavoatrial junction and in the right atrium. Electronically Signed   By: Marijo Sanes M.D.   On: 07/08/2016 12:29   Dg Abdomen 1 View  Result Date: 06/22/2016 CLINICAL DATA:  Burning sensation while trying to have a bowel movement. Recent large bloody stool. EXAM: ABDOMEN - 1 VIEW COMPARISON:  CT scan March 10, 2009 FINDINGS: High attenuation in the pelvis is likely contrast from previous CT imaging within colonic diverticuli. There are probably a few diverticula in the right colon as well. No free air, portal venous gas, or pneumatosis. No bowel obstruction. The lung bases are normal. IMPRESSION: Contrast within colonic diverticuli. No acute abnormalities are seen. Electronically Signed   By: Dorise Bullion III M.D   On: 06/22/2016 21:35   Ct Chest W Contrast  Result Date: 06/26/2016 CLINICAL DATA:  70 year old male with history of esophageal cancer diagnosed by endoscopy at [facility] 06/18/2016. Initial staging examination. EXAM: CT CHEST, ABDOMEN, AND PELVIS WITH CONTRAST TECHNIQUE: Multidetector CT imaging of the chest, abdomen and pelvis was performed following the standard protocol during bolus administration of intravenous contrast. CONTRAST:  66m ISOVUE-300 IOPAMIDOL (ISOVUE-300) INJECTION 61% COMPARISON:  CT the abdomen and pelvis 03/10/2009. FINDINGS: CT CHEST FINDINGS Cardiovascular: Heart size is normal. There is no significant pericardial fluid, thickening or pericardial calcification. There is aortic atherosclerosis, as well as atherosclerosis of the great vessels of the mediastinum and the coronary arteries, including calcified atherosclerotic plaque in the left anterior descending, ramus intermedius, left circumflex and right coronary arteries. Coronary artery stents are noted in the left anterior descending and ramus intermedius coronary arteries. Mediastinum/Nodes: Distal esophageal mass at the level of the gastroesophageal junction measures approximately 3.0 x 3.1 x 4.9  cm (axial image 51 of series 2 and sagittal image 114 of series 6). More proximal aspect of the esophagus is otherwise unremarkable. No pathologically enlarged mediastinal or hilar lymph nodes. No axillary lymphadenopathy. Lungs/Pleura: There multiple tiny 2-4 mm pulmonary nodules scattered throughout the lungs bilaterally, nonspecific, but favored to be benign. Most of these are associated with the fissures, and are likely to represent subpleural lymph nodes. No other larger more suspicious appearing pulmonary nodules or masses are noted. There is no acute consolidative airspace disease. No pleural effusions. Musculoskeletal: There are no aggressive appearing lytic or blastic lesions noted in the visualized portions of the skeleton. CT ABDOMEN PELVIS FINDINGS Hepatobiliary: No cystic or solid hepatic  lesions. No intra or extrahepatic biliary ductal dilatation. Gallbladder is normal in appearance. Pancreas: No pancreatic mass. No pancreatic ductal dilatation. No pancreatic or peripancreatic fluid or inflammatory changes. Spleen: Unremarkable. Adrenals/Urinary Tract: Sub cm low-attenuation lesion in the upper pole of the left kidney is too small to definitively characterize, but appears similar in retrospect to prior study 03/10/2009, likely a cyst. Right kidney and bilateral adrenal glands are normal in appearance. There is no hydroureteronephrosis. Urinary bladder is normal in appearance. Stomach/Bowel: The mass in the distal esophagus is at the level of the gastroesophageal junction, and may extend to involve the very proximal aspect of the cardia of the stomach (this is uncertain). More distal aspect of the stomach is otherwise unremarkable in appearance. No pathologic dilatation of small bowel or colon. Numerous colonic diverticulae are noted, particularly in the sigmoid colon, without surrounding inflammatory changes to suggest an acute diverticulitis at this time. Normal appendix. Vascular/Lymphatic: Aortic  atherosclerosis, with ectasia of the distal infrarenal abdominal aorta which measures up to 2.8 cm in diameter shortly before the level of the aortic bifurcation. Prominent but nonenlarged 7 mm short axis celiac axis lymph node is noted, but is nonspecific. No lymphadenopathy noted in the abdomen or pelvis. Reproductive: Prostate gland and seminal vesicles are unremarkable in appearance. Other: Small right inguinal hernia containing only fat incidentally noted. No significant volume of ascites. No pneumoperitoneum. Musculoskeletal: There are no aggressive appearing lytic or blastic lesions noted in the visualized portions of the skeleton. IMPRESSION: 1. Distal esophageal mass measuring approximately 3.0 x 3.1 x 4.9 cm centered at the level of the gastroesophageal junction, with potential extension to involve the most proximal aspect of the cardia. There is no surrounding lymphadenopathy, and no definite evidence of metastatic disease in the chest, abdomen or pelvis. 2. Several tiny 2-4 mm pulmonary nodules scattered throughout the lungs bilaterally are considered highly nonspecific. As most of these are subpleural in location, these are favored to represent subpleural lymph nodes, however, attention on followup studies is recommended to ensure the stability or resolution of these findings. 3. Aortic atherosclerosis, in addition to three-vessel coronary artery disease. Please note that although the presence of coronary artery calcium documents the presence of coronary artery disease, the severity of this disease and any potential stenosis cannot be assessed on this non-gated CT examination. Assessment for potential risk factor modification, dietary therapy or pharmacologic therapy may be warranted, if clinically indicated. 4. Colonic diverticulosis without evidence of acute diverticulitis at this time. 5. Additional incidental findings, as above. Electronically Signed   By: Vinnie Langton M.D.   On: 06/26/2016  11:33   Ct Abdomen Pelvis W Contrast  Result Date: 06/26/2016 CLINICAL DATA:  70 year old male with history of esophageal cancer diagnosed by endoscopy at [facility] 06/18/2016. Initial staging examination. EXAM: CT CHEST, ABDOMEN, AND PELVIS WITH CONTRAST TECHNIQUE: Multidetector CT imaging of the chest, abdomen and pelvis was performed following the standard protocol during bolus administration of intravenous contrast. CONTRAST:  57m ISOVUE-300 IOPAMIDOL (ISOVUE-300) INJECTION 61% COMPARISON:  CT the abdomen and pelvis 03/10/2009. FINDINGS: CT CHEST FINDINGS Cardiovascular: Heart size is normal. There is no significant pericardial fluid, thickening or pericardial calcification. There is aortic atherosclerosis, as well as atherosclerosis of the great vessels of the mediastinum and the coronary arteries, including calcified atherosclerotic plaque in the left anterior descending, ramus intermedius, left circumflex and right coronary arteries. Coronary artery stents are noted in the left anterior descending and ramus intermedius coronary arteries. Mediastinum/Nodes: Distal esophageal mass at the  level of the gastroesophageal junction measures approximately 3.0 x 3.1 x 4.9 cm (axial image 51 of series 2 and sagittal image 114 of series 6). More proximal aspect of the esophagus is otherwise unremarkable. No pathologically enlarged mediastinal or hilar lymph nodes. No axillary lymphadenopathy. Lungs/Pleura: There multiple tiny 2-4 mm pulmonary nodules scattered throughout the lungs bilaterally, nonspecific, but favored to be benign. Most of these are associated with the fissures, and are likely to represent subpleural lymph nodes. No other larger more suspicious appearing pulmonary nodules or masses are noted. There is no acute consolidative airspace disease. No pleural effusions. Musculoskeletal: There are no aggressive appearing lytic or blastic lesions noted in the visualized portions of the skeleton. CT ABDOMEN  PELVIS FINDINGS Hepatobiliary: No cystic or solid hepatic lesions. No intra or extrahepatic biliary ductal dilatation. Gallbladder is normal in appearance. Pancreas: No pancreatic mass. No pancreatic ductal dilatation. No pancreatic or peripancreatic fluid or inflammatory changes. Spleen: Unremarkable. Adrenals/Urinary Tract: Sub cm low-attenuation lesion in the upper pole of the left kidney is too small to definitively characterize, but appears similar in retrospect to prior study 03/10/2009, likely a cyst. Right kidney and bilateral adrenal glands are normal in appearance. There is no hydroureteronephrosis. Urinary bladder is normal in appearance. Stomach/Bowel: The mass in the distal esophagus is at the level of the gastroesophageal junction, and may extend to involve the very proximal aspect of the cardia of the stomach (this is uncertain). More distal aspect of the stomach is otherwise unremarkable in appearance. No pathologic dilatation of small bowel or colon. Numerous colonic diverticulae are noted, particularly in the sigmoid colon, without surrounding inflammatory changes to suggest an acute diverticulitis at this time. Normal appendix. Vascular/Lymphatic: Aortic atherosclerosis, with ectasia of the distal infrarenal abdominal aorta which measures up to 2.8 cm in diameter shortly before the level of the aortic bifurcation. Prominent but nonenlarged 7 mm short axis celiac axis lymph node is noted, but is nonspecific. No lymphadenopathy noted in the abdomen or pelvis. Reproductive: Prostate gland and seminal vesicles are unremarkable in appearance. Other: Small right inguinal hernia containing only fat incidentally noted. No significant volume of ascites. No pneumoperitoneum. Musculoskeletal: There are no aggressive appearing lytic or blastic lesions noted in the visualized portions of the skeleton. IMPRESSION: 1. Distal esophageal mass measuring approximately 3.0 x 3.1 x 4.9 cm centered at the level of the  gastroesophageal junction, with potential extension to involve the most proximal aspect of the cardia. There is no surrounding lymphadenopathy, and no definite evidence of metastatic disease in the chest, abdomen or pelvis. 2. Several tiny 2-4 mm pulmonary nodules scattered throughout the lungs bilaterally are considered highly nonspecific. As most of these are subpleural in location, these are favored to represent subpleural lymph nodes, however, attention on followup studies is recommended to ensure the stability or resolution of these findings. 3. Aortic atherosclerosis, in addition to three-vessel coronary artery disease. Please note that although the presence of coronary artery calcium documents the presence of coronary artery disease, the severity of this disease and any potential stenosis cannot be assessed on this non-gated CT examination. Assessment for potential risk factor modification, dietary therapy or pharmacologic therapy may be warranted, if clinically indicated. 4. Colonic diverticulosis without evidence of acute diverticulitis at this time. 5. Additional incidental findings, as above. Electronically Signed   By: Vinnie Langton M.D.   On: 06/26/2016 11:33   Nm Pet Image Initial (pi) Skull Base To Thigh  Result Date: 07/03/2016 CLINICAL DATA:  Initial treatment strategy  for esophageal carcinoma. EXAM: NUCLEAR MEDICINE PET SKULL BASE TO THIGH TECHNIQUE: 12.1 mCi F-18 FDG was injected intravenously. Full-ring PET imaging was performed from the skull base to thigh after the radiotracer. CT data was obtained and used for attenuation correction and anatomic localization. FASTING BLOOD GLUCOSE:  Value: 100 mg/dl COMPARISON:  CT 06/26/2016 FINDINGS: NECK No hypermetabolic lymph nodes in the neck. CHEST No hypermetabolic mediastinal or hilar nodes. No suspicious pulmonary nodules on the CT scan. ABDOMEN/PELVIS Hypermetabolic thickening the gastroesophageal junction measures 3.5 cm with SUV max equals  7.5. No hypermetabolic gastrohepatic ligament lymph nodes. No hypermetabolic foci within the liver. No hypermetabolic abdominal lymph nodes, periaortic lymph nodes or pelvic lymph nodes. Incidental finding of multiple sigmoid diverticula. Atherosclerotic calcification of the aorta. SKELETON No focal hypermetabolic activity to suggest skeletal metastasis. IMPRESSION: 1. Hypermetabolic thickening at the gastroesophageal junction consists with primary esophageal adenocarcinoma. 2. No evidence of local nodal metastasis within the gastrohepatic ligament or mediastinum. 3. No evidence of liver metastasis or distant metastasis Electronically Signed   By: Suzy Bouchard M.D.   On: 07/03/2016 15:07   Dg C-arm 1-60 Min-no Report  Result Date: 07/08/2016 CLINICAL DATA: surgery, port placement C-ARM 1-60 MINUTES Fluoroscopy was utilized by the requesting physician.  No radiographic interpretation.    ASSESSMENT: Stage IIb adenocarcinoma of the lower third of the esophagus.  PLAN:    1. Stage IIb adenocarcinoma of the lower third of the esophagus: Patient had EUS completed at [facility] confirming the stage and diagnosis. PET scan results reviewed independently and reported as above with no obvious evidence of metastatic disease.  Patient will benefit from neoadjuvant chemotherapy and XRT, possibly followed by esophagectomy. A referral has been made to [facility] for surgical evaluation, patient appointment is on July 16, 2016. Proceed with cycle 1 of weekly carboplatinum and Taxol today. Patient will initiate XRT on July 15, 2016. Port placement has been requested, but this will not delay initiation of treatment. 2. Esophageal obstruction: Patient has progressive difficulty swallowing foods and although he has not lost any weight up to this point, there is concern of maintaining his nutritional status. By report, patient also required a pediatric scope during his EGD secondary to his stenosis.  Patient had consultation with dietary. He will require a J-tube for nutritional support, which will likely be placed by his surgeon performing the esophagectomy. This will also be necessary if patient undergoes esophagectomy. 3. Thrombocytopenia: Previously entire workup did not reveal a distinct etiology. No bone marrow biopsy has been performed to this point. Continue to monitor closely. 4. Leukopenia: Patient's white blood cell count is decreased, but approximately his baseline. Continue to monitor. 5. Renal insufficiency: Patient's creatinine is slightly elevated, possibly secondary to mild dehydration. Monitor.   Patient expressed understanding and was in agreement with this plan. He also understands that He can call clinic at any time with any questions, concerns, or complaints.    [name], MD   07/13/2016 12:08 PM

## 2016-07-08 NOTE — Discharge Instructions (Signed)
AMBULATORY SURGERY  DISCHARGE INSTRUCTIONS   1) The drugs that you were given will stay in your system until tomorrow so for the next 24 hours you should not:  A) Drive an automobile B) Make any legal decisions C) Drink any alcoholic beverage   2) You may resume regular meals tomorrow.  Today it is better to start with liquids and gradually work up to solid foods.  You may eat anything you prefer, but it is better to start with liquids, then soup and crackers, and gradually work up to solid foods.   3) Please notify your doctor immediately if you have any unusual bleeding, trouble breathing, redness and pain at the surgery site, drainage, fever, or pain not relieved by medication.   Please contact your physician with any problems or Same Day Surgery at [phone], Monday through Friday 6 am to 4 pm, or [place] at [facility] number at [phone].  Implanted Port Insertion, Care After Refer to this sheet in the next few weeks. These instructions provide you with information on caring for yourself after your procedure. Your health care provider may also give you more specific instructions. Your treatment has been planned according to current medical practices, but problems sometimes occur. Call your health care provider if you have any problems or questions after your procedure. WHAT TO EXPECT AFTER THE PROCEDURE After your procedure, it is typical to have the following:   Discomfort at the port insertion site. Ice packs to the area will help.  Bruising on the skin over the port. This will subside in 3-4 days. HOME CARE INSTRUCTIONS  After your port is placed, you will get a manufacturer's information card. The card has information about your port. Keep this card with you at all times.   Know what kind of port you have. There are many types of ports available.   Wear a medical alert bracelet in case of an emergency. This can help alert health care workers that you have a  port.   The port can stay in for as long as your health care provider believes it is necessary.   A home health care nurse may give medicines and take care of the port.   You or a family member can get special training and directions for giving medicine and taking care of the port at home.  SEEK MEDICAL CARE IF:   Your port does not flush or you are unable to get a blood return.   You have a fever or chills. SEEK IMMEDIATE MEDICAL CARE IF:  You have new fluid or pus coming from your incision.   You notice a bad smell coming from your incision site.   You have swelling, pain, or more redness at the incision or port site.   You have chest pain or shortness of breath.   This information is not intended to replace advice given to you by your health care provider. Make sure you discuss any questions you have with your health care provider.   Document Released: 06/23/2013 Document Revised: 09/07/2013 Document Reviewed: 06/23/2013 Elsevier Interactive Patient Education [employer].

## 2016-07-08 NOTE — Transfer of Care (Signed)
Immediate Anesthesia Transfer of Care Note  Patient: BRANDOM KERWIN  Procedure(s) Performed: Procedure(s): INSERTION PORT-A-CATH (N/A)  Patient Location: PACU  Anesthesia Type:MAC  Level of Consciousness: awake  Airway & Oxygen Therapy: Patient Spontanous Breathing and Patient connected to nasal cannula oxygen  Post-op Assessment: Report given to RN and Post -op Vital signs reviewed and stable  Post vital signs: Reviewed and stable  Last Vitals:  Vitals:   07/08/16 0835 07/08/16 1207  BP: (!) 144/83   Pulse: (!) 50 (!) (P) 54  Resp: 18 (P) 15  Temp: 36.7 C (P) 36.1 C    Last Pain:  Vitals:   07/08/16 0835  TempSrc: Oral         Complications: No apparent anesthesia complications

## 2016-07-08 NOTE — Op Note (Signed)
  Procedure Date:  07/08/2016  Pre-operative Diagnosis:  Esophageal cancer  Post-operative Diagnosis: Esophageal cancer  Procedure:  Left subclavian port-a-cath placement  Surgeon:  [name], MD  Anesthesia:  General endotracheal  Estimated Blood Loss:  5 ml  Specimens:  None  Complications:  None  Indications for Procedure:  This is a 70 y.o. male who requires a port-a-cath for chemotherapy.  The risks of bleeding, infection, injury to surrounding structures, thrombosis, nonfunction, pneumothorax, hemothorax, and need for further procedures were discussed with the patient and was willing to proceed.  Description of Procedure: The patient was correctly identified in the preoperative area and brought into the operating room.  The patient was placed supine with VTE prophylaxis in place.  Appropriate time-outs were performed.  Anesthesia was induced.  Appropriate antibiotics were infused.  The left chest and neck were prepped and draped in usual sterile fashion. The patient was placed in Trendelenburg position and local anesthetic was infiltrated into the skin and subcutaneous tissues in the anterior chest wall. The large bore needle was placed into the subclavian vein without difficulty and then the Seldinger wire was advanced. Fluoroscopy was utilized to confirm that the Seldinger wire was in the superior vena cava.  An incision was made and a port pocket developed with blunt and electrocautery dissection. The introducer dilator was placed over the Seldinger wire the wire was removed. The previously flushed catheter was placed into the introducer dilator and the peel-away sheath was removed. The catheter length was confirmed and trimmed utilizing fluoroscopy for proper positioning.   At that point, it was noted that one of the instruments had not been properly sterilized.  Decision was made to remove all instruments and from the sterile field and bring a new sterile set.  A new  port-a-cath kit was also opened.  The new wire was introduced through the previous catheter and confirmed in good place via fluoroscopy.  The catheter was removed over the wire and disposed of with the previous kit.  A new peel-away introducer was placed over the wire and a new catheter was placed and flushed and cut appropriately.  The catheter was then attached to the previously flushed port. The port was placed into the pocket. The port was held in with 3-0 Prolenes and flushed for function and heparin locked.  The wound was closed with interrupted 3-0 Vicryl followed by 4-0 subcuticular Monocryl sutures and sealed with DermaBond.  The patient was emerged from anesthesia and brought to the recovery room for further management.  A chest x-ray was ordered.  The patient tolerated the procedure well and all counts were correct at the end of the case.   [name], MD

## 2016-07-08 NOTE — Interval H&P Note (Signed)
History and Physical Interval Note:  07/08/2016 10:04 AM  Corey Perry  has presented today for surgery, with the diagnosis of esophageal cancer  The various methods of treatment have been discussed with the patient and family. After consideration of risks, benefits and other options for treatment, the patient has consented to  Procedure(s): INSERTION PORT-A-CATH (N/A) as a surgical intervention .  [name] history has been reviewed, patient examined, no change in status, stable for surgery.  I have reviewed the patient's chart and labs.  Questions were answered to the patient's satisfaction.     Vartan Kerins

## 2016-07-09 ENCOUNTER — Encounter: Payer: Self-pay | Admitting: Surgery

## 2016-07-09 ENCOUNTER — Inpatient Hospital Stay: Payer: Medicare Other

## 2016-07-09 ENCOUNTER — Inpatient Hospital Stay (HOSPITAL_BASED_OUTPATIENT_CLINIC_OR_DEPARTMENT_OTHER): Payer: Medicare Other | Admitting: Oncology

## 2016-07-09 VITALS — BP 139/73 | HR 47 | Temp 97.1°F | Resp 18 | Wt 207.8 lb

## 2016-07-09 VITALS — BP 100/60 | HR 48

## 2016-07-09 DIAGNOSIS — C155 Malignant neoplasm of lower third of esophagus: Secondary | ICD-10-CM

## 2016-07-09 DIAGNOSIS — R1310 Dysphagia, unspecified: Secondary | ICD-10-CM | POA: Diagnosis not present

## 2016-07-09 DIAGNOSIS — N400 Enlarged prostate without lower urinary tract symptoms: Secondary | ICD-10-CM

## 2016-07-09 DIAGNOSIS — R001 Bradycardia, unspecified: Secondary | ICD-10-CM

## 2016-07-09 DIAGNOSIS — R112 Nausea with vomiting, unspecified: Secondary | ICD-10-CM

## 2016-07-09 DIAGNOSIS — E039 Hypothyroidism, unspecified: Secondary | ICD-10-CM

## 2016-07-09 DIAGNOSIS — K222 Esophageal obstruction: Secondary | ICD-10-CM

## 2016-07-09 DIAGNOSIS — K921 Melena: Secondary | ICD-10-CM

## 2016-07-09 DIAGNOSIS — D696 Thrombocytopenia, unspecified: Secondary | ICD-10-CM | POA: Diagnosis not present

## 2016-07-09 DIAGNOSIS — E860 Dehydration: Secondary | ICD-10-CM

## 2016-07-09 DIAGNOSIS — F419 Anxiety disorder, unspecified: Secondary | ICD-10-CM

## 2016-07-09 DIAGNOSIS — I129 Hypertensive chronic kidney disease with stage 1 through stage 4 chronic kidney disease, or unspecified chronic kidney disease: Secondary | ICD-10-CM

## 2016-07-09 DIAGNOSIS — K4090 Unilateral inguinal hernia, without obstruction or gangrene, not specified as recurrent: Secondary | ICD-10-CM

## 2016-07-09 DIAGNOSIS — Z79899 Other long term (current) drug therapy: Secondary | ICD-10-CM

## 2016-07-09 DIAGNOSIS — E785 Hyperlipidemia, unspecified: Secondary | ICD-10-CM

## 2016-07-09 DIAGNOSIS — R918 Other nonspecific abnormal finding of lung field: Secondary | ICD-10-CM

## 2016-07-09 DIAGNOSIS — K5730 Diverticulosis of large intestine without perforation or abscess without bleeding: Secondary | ICD-10-CM

## 2016-07-09 DIAGNOSIS — M48061 Spinal stenosis, lumbar region without neurogenic claudication: Secondary | ICD-10-CM

## 2016-07-09 DIAGNOSIS — Z87891 Personal history of nicotine dependence: Secondary | ICD-10-CM

## 2016-07-09 DIAGNOSIS — K219 Gastro-esophageal reflux disease without esophagitis: Secondary | ICD-10-CM

## 2016-07-09 DIAGNOSIS — Z7982 Long term (current) use of aspirin: Secondary | ICD-10-CM

## 2016-07-09 DIAGNOSIS — I700 Atherosclerosis of aorta: Secondary | ICD-10-CM

## 2016-07-09 DIAGNOSIS — G4730 Sleep apnea, unspecified: Secondary | ICD-10-CM

## 2016-07-09 DIAGNOSIS — I2510 Atherosclerotic heart disease of native coronary artery without angina pectoris: Secondary | ICD-10-CM

## 2016-07-09 DIAGNOSIS — N189 Chronic kidney disease, unspecified: Secondary | ICD-10-CM

## 2016-07-09 DIAGNOSIS — D72819 Decreased white blood cell count, unspecified: Secondary | ICD-10-CM

## 2016-07-09 LAB — COMPREHENSIVE METABOLIC PANEL
ALT: 25 U/L (ref 17–63)
ANION GAP: 6 (ref 5–15)
AST: 26 U/L (ref 15–41)
Albumin: 4.4 g/dL (ref 3.5–5.0)
Alkaline Phosphatase: 42 U/L (ref 38–126)
BUN: 22 mg/dL — ABNORMAL HIGH (ref 6–20)
CHLORIDE: 101 mmol/L (ref 101–111)
CO2: 25 mmol/L (ref 22–32)
CREATININE: 1.42 mg/dL — AB (ref 0.61–1.24)
Calcium: 8.8 mg/dL — ABNORMAL LOW (ref 8.9–10.3)
GFR calc non Af Amer: 49 mL/min — ABNORMAL LOW (ref >60)
GFR, EST AFRICAN AMERICAN: 57 mL/min — AB (ref >60)
Glucose, Bld: 121 mg/dL — ABNORMAL HIGH (ref 65–99)
POTASSIUM: 4.4 mmol/L (ref 3.5–5.1)
SODIUM: 132 mmol/L — AB (ref 135–145)
Total Bilirubin: 0.8 mg/dL (ref 0.3–1.2)
Total Protein: 7.2 g/dL (ref 6.5–8.1)

## 2016-07-09 LAB — CBC WITH DIFFERENTIAL/PLATELET
BASOS ABS: 0 10*3/uL (ref 0–0.1)
BASOS PCT: 0 %
Eosinophils Absolute: 0 10*3/uL (ref 0–0.7)
Eosinophils Relative: 1 %
HCT: 36.7 % — ABNORMAL LOW (ref 40.0–52.0)
Hemoglobin: 12.9 g/dL — ABNORMAL LOW (ref 13.0–18.0)
LYMPHS PCT: 23 %
Lymphs Abs: 0.7 10*3/uL — ABNORMAL LOW (ref 1.0–3.6)
MCH: 34.8 pg — AB (ref 26.0–34.0)
MCHC: 35.2 g/dL (ref 32.0–36.0)
MCV: 98.8 fL (ref 80.0–100.0)
MONOS PCT: 11 %
Monocytes Absolute: 0.3 10*3/uL (ref 0.2–1.0)
NEUTROS ABS: 1.8 10*3/uL (ref 1.4–6.5)
NEUTROS PCT: 65 %
PLATELETS: 92 10*3/uL — AB (ref 150–440)
RBC: 3.71 MIL/uL — ABNORMAL LOW (ref 4.40–5.90)
RDW: 14.4 % (ref 11.5–14.5)
WBC: 2.8 10*3/uL — ABNORMAL LOW (ref 3.8–10.6)

## 2016-07-09 MED ORDER — SODIUM CHLORIDE 0.9% FLUSH
10.0000 mL | INTRAVENOUS | Status: DC | PRN
  Administered 2016-07-09: 10 mL
  Filled 2016-07-09: qty 10

## 2016-07-09 MED ORDER — SODIUM CHLORIDE 0.9 % IV SOLN
Freq: Once | INTRAVENOUS | Status: AC
  Administered 2016-07-09: 11:00:00 via INTRAVENOUS
  Filled 2016-07-09: qty 1000

## 2016-07-09 MED ORDER — HEPARIN SOD (PORK) LOCK FLUSH 100 UNIT/ML IV SOLN
500.0000 [IU] | Freq: Once | INTRAVENOUS | Status: AC | PRN
  Administered 2016-07-09: 500 [IU]
  Filled 2016-07-09: qty 5

## 2016-07-09 MED ORDER — SODIUM CHLORIDE 0.9 % IV SOLN
181.2000 mg | Freq: Once | INTRAVENOUS | Status: AC
  Administered 2016-07-09: 180 mg via INTRAVENOUS
  Filled 2016-07-09: qty 18

## 2016-07-09 MED ORDER — DIPHENHYDRAMINE HCL 50 MG/ML IJ SOLN
25.0000 mg | Freq: Once | INTRAMUSCULAR | Status: AC
  Administered 2016-07-09: 25 mg via INTRAVENOUS
  Filled 2016-07-09: qty 1

## 2016-07-09 MED ORDER — DEXAMETHASONE SODIUM PHOSPHATE 10 MG/ML IJ SOLN
10.0000 mg | Freq: Once | INTRAMUSCULAR | Status: AC
  Administered 2016-07-09: 10 mg via INTRAVENOUS
  Filled 2016-07-09: qty 1

## 2016-07-09 MED ORDER — PALONOSETRON HCL INJECTION 0.25 MG/5ML
0.2500 mg | Freq: Once | INTRAVENOUS | Status: AC
  Administered 2016-07-09: 0.25 mg via INTRAVENOUS
  Filled 2016-07-09: qty 5

## 2016-07-09 MED ORDER — FAMOTIDINE IN NACL 20-0.9 MG/50ML-% IV SOLN
20.0000 mg | Freq: Once | INTRAVENOUS | Status: AC
  Administered 2016-07-09: 20 mg via INTRAVENOUS
  Filled 2016-07-09: qty 50

## 2016-07-09 MED ORDER — SODIUM CHLORIDE 0.9 % IV SOLN: 10.0000 mg | Freq: Once | INTRAVENOUS | Status: DC

## 2016-07-09 MED ORDER — PACLITAXEL CHEMO INJECTION 300 MG/50ML
45.0000 mg/m2 | Freq: Once | INTRAVENOUS | Status: AC
  Administered 2016-07-09: 96 mg via INTRAVENOUS
  Filled 2016-07-09: qty 16

## 2016-07-09 NOTE — Progress Notes (Signed)
States is feeling well. Had PAC placed yesterday.

## 2016-07-10 ENCOUNTER — Telehealth: Payer: Self-pay

## 2016-07-10 NOTE — Telephone Encounter (Signed)
  Oncology Nurse Navigator Documentation Patient requested CD of PET to take to [facility] for consult with Dr. Elenor Quinones. CD ordered and will be available for pick up cancer center front desk. Navigator Location: [facility] (07/10/16 1300)   )Navigator Encounter Type: Telephone;Diagnostic Results (07/10/16 1300)                                                    Time Spent with Patient: 15 (07/10/16 1300)

## 2016-07-15 ENCOUNTER — Ambulatory Visit (INDEPENDENT_AMBULATORY_CARE_PROVIDER_SITE_OTHER): Payer: Medicare Other | Admitting: General Surgery

## 2016-07-15 ENCOUNTER — Ambulatory Visit
Admission: RE | Admit: 2016-07-15 | Discharge: 2016-07-15 | Disposition: A | Discharge status: Home / Self Care | Payer: Medicare Other | Source: Ambulatory Visit | Attending: Radiation Oncology | Admitting: Radiation Oncology

## 2016-07-15 ENCOUNTER — Encounter: Payer: Self-pay | Admitting: General Surgery

## 2016-07-15 VITALS — BP 133/69 | HR 64 | Temp 98.1°F | Ht 69.0 in | Wt 208.0 lb

## 2016-07-15 DIAGNOSIS — Z4889 Encounter for other specified surgical aftercare: Secondary | ICD-10-CM

## 2016-07-15 NOTE — Progress Notes (Signed)
Outpatient Surgical Follow Up  07/15/2016  Corey Perry is an 70 y.o. male.   Chief Complaint  Patient presents with  . Routine Post Op    HPI: 70 year old male returns for follow-up 1 week status post chemotherapy port placement. Patient reports doing very well. They've artery access is poor and he started surgery treatment for his esophageal cancer. He currently denies any pain, fever, chills, nausea, vomiting, chest pain, shortness breath, diarrhea, constipation. He's been doing very well and has been very appreciative of his care.  Past Medical History:  Diagnosis Date  . Abnormal white blood cell (WBC) count    seeing Dr. [name] at [place] on 10/25  . BPH (benign prostatic hypertrophy)   . Bradycardia   . CAD (coronary artery disease)   . Chronic kidney disease   . Dysrhythmia    bradycardia - sees Dr. Humphrey Rolls  . Esophageal cancer ([place]) 05/2016  . GERD (gastroesophageal reflux disease)   . Hyperlipidemia   . Hypertension   . Hypothyroidism   . Sleep apnea    mild - SS - Care everywhere -04/18/07  . Spinal stenosis    lumbar    Past Surgical History:  Procedure Laterality Date  . BACK SURGERY    . CARDIAC CATHETERIZATION Left 03/12/2016   Procedure: Left Heart Cath and Coronary Angiography;  Surgeon: [name], MD;  Location: [place] [facility];  Service: Cardiovascular;  Laterality: Left;  . CARDIAC CATHETERIZATION N/A 03/12/2016   Procedure: Coronary Stent Intervention;  Surgeon: Yolonda Kida, MD;  Location: [place] [facility];  Service: Cardiovascular;  Laterality: N/A;  . COLONOSCOPY WITH PROPOFOL N/A 07/13/2015   Procedure: COLONOSCOPY WITH PROPOFOL;  Surgeon: Lucilla Lame, MD;  Location: [facility];  Service: Endoscopy;  Laterality: N/A;  . CORONARY ANGIOPLASTY  10/05 and 12/05   4 DES placed  . ESOPHAGOGASTRODUODENOSCOPY (EGD) WITH PROPOFOL N/A 06/14/2016   Procedure: ESOPHAGOGASTRODUODENOSCOPY (EGD) WITH PROPOFOL;  Surgeon: [name], MD;  Location: [facility];  Service: Endoscopy;  Laterality: N/A;  . HERNIA REPAIR     x3  . POLYPECTOMY  07/13/2015   Procedure: POLYPECTOMY;  Surgeon: Lucilla Lame, MD;  Location: [place];  Service: Endoscopy;;  . PORTACATH PLACEMENT N/A 07/08/2016   Procedure: INSERTION PORT-A-CATH;  Surgeon: Olean Ree, MD;  Location: [facility];  Service: General;  Laterality: N/A;    Family History  Problem Relation Age of Onset  . Hypertension Mother   . Heart disease Father   . Hypertension Father   . Kidney disease Father   . Hypertension Brother   . Diabetes Son     Social History:  reports that he quit smoking about 12 years ago. His smoking use included Cigarettes. He has a 40.00 pack-year smoking history. He has quit using smokeless tobacco. His smokeless tobacco use included Chew. He reports that he does not drink alcohol or use drugs.  Allergies: No Known Allergies  Medications reviewed.    ROS A multipoint review of systems was completed, all pertinent positives and negatives are documented within the history of present illness remainder are negative.   BP 133/69   Pulse 64   Temp 98.1 F (36.7 C) (Oral)   Ht '5\' 9"'$  (1.753 m)   Wt 94.3 kg (208 lb)   BMI 30.72 kg/m   Physical Exam Gen.: No acute distress Neck: Supple and nontender Chest: Clear to auscultation. Left subclavian Chemo-Port in place with Dermabond over the incisions. No evidence  of infection or fluid collection. Marker from radiation present in the lower chest. Heart: Regular rate and rhythm Abdomen: Soft, nontender, nondistended    No results found for this or any previous visit (from the past 48 hour(s)). No results found.  Assessment/Plan:  1. Aftercare following surgery 70 year old male status post left subclavian Chemo-Port placement. Doing very well. Discussed at length the signs and symptoms of infection and for him to be seen immediately by provider should they occur.  Otherwise discussed there is no need for further follow-up with our department as he is going to be seen by surgical oncology later this week at a tertiary care center. Patient will follow-up in clinic on an as-needed basis or after he completed all therapy and is ready for his port removed.     Clayburn Pert, MD FACS General Surgeon  07/15/2016,10:12 AM

## 2016-07-15 NOTE — Patient Instructions (Signed)
Please call with any questions or concerns.

## 2016-07-16 ENCOUNTER — Ambulatory Visit
Admission: RE | Admit: 2016-07-16 | Discharge: 2016-07-16 | Disposition: A | Discharge status: Home / Self Care | Payer: Medicare Other | Source: Ambulatory Visit | Attending: Radiation Oncology | Admitting: Radiation Oncology

## 2016-07-16 ENCOUNTER — Ambulatory Visit: Payer: Medicare Other

## 2016-07-16 DIAGNOSIS — C155 Malignant neoplasm of lower third of esophagus: Secondary | ICD-10-CM | POA: Diagnosis not present

## 2016-07-17 ENCOUNTER — Inpatient Hospital Stay: Payer: Medicare Other

## 2016-07-17 ENCOUNTER — Ambulatory Visit: Payer: Medicare Other

## 2016-07-17 ENCOUNTER — Inpatient Hospital Stay (HOSPITAL_BASED_OUTPATIENT_CLINIC_OR_DEPARTMENT_OTHER): Payer: Medicare Other | Admitting: Oncology

## 2016-07-17 ENCOUNTER — Ambulatory Visit
Admission: RE | Admit: 2016-07-17 | Discharge: 2016-07-17 | Disposition: A | Discharge status: Home / Self Care | Payer: Medicare Other | Source: Ambulatory Visit | Attending: Radiation Oncology | Admitting: Radiation Oncology

## 2016-07-17 ENCOUNTER — Inpatient Hospital Stay: Payer: Medicare Other | Attending: Oncology

## 2016-07-17 VITALS — BP 109/56 | HR 50 | Resp 20

## 2016-07-17 VITALS — BP 126/75 | HR 54 | Temp 96.5°F | Resp 18 | Wt 206.8 lb

## 2016-07-17 DIAGNOSIS — F419 Anxiety disorder, unspecified: Secondary | ICD-10-CM

## 2016-07-17 DIAGNOSIS — R1310 Dysphagia, unspecified: Secondary | ICD-10-CM

## 2016-07-17 DIAGNOSIS — D696 Thrombocytopenia, unspecified: Secondary | ICD-10-CM | POA: Insufficient documentation

## 2016-07-17 DIAGNOSIS — K921 Melena: Secondary | ICD-10-CM | POA: Diagnosis not present

## 2016-07-17 DIAGNOSIS — E785 Hyperlipidemia, unspecified: Secondary | ICD-10-CM | POA: Diagnosis not present

## 2016-07-17 DIAGNOSIS — R918 Other nonspecific abnormal finding of lung field: Secondary | ICD-10-CM | POA: Insufficient documentation

## 2016-07-17 DIAGNOSIS — Z7982 Long term (current) use of aspirin: Secondary | ICD-10-CM | POA: Insufficient documentation

## 2016-07-17 DIAGNOSIS — E039 Hypothyroidism, unspecified: Secondary | ICD-10-CM

## 2016-07-17 DIAGNOSIS — Z87891 Personal history of nicotine dependence: Secondary | ICD-10-CM

## 2016-07-17 DIAGNOSIS — D72819 Decreased white blood cell count, unspecified: Secondary | ICD-10-CM | POA: Diagnosis not present

## 2016-07-17 DIAGNOSIS — C155 Malignant neoplasm of lower third of esophagus: Secondary | ICD-10-CM

## 2016-07-17 DIAGNOSIS — Z5111 Encounter for antineoplastic chemotherapy: Secondary | ICD-10-CM | POA: Diagnosis not present

## 2016-07-17 DIAGNOSIS — G4730 Sleep apnea, unspecified: Secondary | ICD-10-CM | POA: Diagnosis not present

## 2016-07-17 DIAGNOSIS — K5730 Diverticulosis of large intestine without perforation or abscess without bleeding: Secondary | ICD-10-CM | POA: Diagnosis not present

## 2016-07-17 DIAGNOSIS — R112 Nausea with vomiting, unspecified: Secondary | ICD-10-CM | POA: Diagnosis not present

## 2016-07-17 DIAGNOSIS — K222 Esophageal obstruction: Secondary | ICD-10-CM | POA: Insufficient documentation

## 2016-07-17 DIAGNOSIS — K219 Gastro-esophageal reflux disease without esophagitis: Secondary | ICD-10-CM

## 2016-07-17 DIAGNOSIS — I2510 Atherosclerotic heart disease of native coronary artery without angina pectoris: Secondary | ICD-10-CM

## 2016-07-17 DIAGNOSIS — I129 Hypertensive chronic kidney disease with stage 1 through stage 4 chronic kidney disease, or unspecified chronic kidney disease: Secondary | ICD-10-CM

## 2016-07-17 DIAGNOSIS — M48061 Spinal stenosis, lumbar region without neurogenic claudication: Secondary | ICD-10-CM

## 2016-07-17 DIAGNOSIS — N189 Chronic kidney disease, unspecified: Secondary | ICD-10-CM | POA: Insufficient documentation

## 2016-07-17 DIAGNOSIS — N400 Enlarged prostate without lower urinary tract symptoms: Secondary | ICD-10-CM | POA: Insufficient documentation

## 2016-07-17 DIAGNOSIS — R001 Bradycardia, unspecified: Secondary | ICD-10-CM

## 2016-07-17 DIAGNOSIS — Z923 Personal history of irradiation: Secondary | ICD-10-CM | POA: Diagnosis not present

## 2016-07-17 DIAGNOSIS — I700 Atherosclerosis of aorta: Secondary | ICD-10-CM | POA: Diagnosis not present

## 2016-07-17 DIAGNOSIS — K4090 Unilateral inguinal hernia, without obstruction or gangrene, not specified as recurrent: Secondary | ICD-10-CM | POA: Diagnosis not present

## 2016-07-17 LAB — CBC WITH DIFFERENTIAL/PLATELET
BASOS PCT: 1 %
Basophils Absolute: 0 10*3/uL (ref 0–0.1)
Eosinophils Absolute: 0 10*3/uL (ref 0–0.7)
Eosinophils Relative: 1 %
HEMATOCRIT: 38.5 % — AB (ref 40.0–52.0)
Hemoglobin: 13.3 g/dL (ref 13.0–18.0)
Lymphocytes Relative: 23 %
Lymphs Abs: 0.6 10*3/uL — ABNORMAL LOW (ref 1.0–3.6)
MCH: 34.5 pg — ABNORMAL HIGH (ref 26.0–34.0)
MCHC: 34.6 g/dL (ref 32.0–36.0)
MCV: 99.8 fL (ref 80.0–100.0)
MONO ABS: 0.3 10*3/uL (ref 0.2–1.0)
MONOS PCT: 12 %
NEUTROS ABS: 1.6 10*3/uL (ref 1.4–6.5)
Neutrophils Relative %: 63 %
Platelets: 101 10*3/uL — ABNORMAL LOW (ref 150–440)
RBC: 3.86 MIL/uL — ABNORMAL LOW (ref 4.40–5.90)
RDW: 13.8 % (ref 11.5–14.5)
WBC: 2.6 10*3/uL — ABNORMAL LOW (ref 3.8–10.6)

## 2016-07-17 LAB — COMPREHENSIVE METABOLIC PANEL
ALBUMIN: 4.5 g/dL (ref 3.5–5.0)
ALT: 25 U/L (ref 17–63)
ANION GAP: 7 (ref 5–15)
AST: 25 U/L (ref 15–41)
Alkaline Phosphatase: 45 U/L (ref 38–126)
BILIRUBIN TOTAL: 0.7 mg/dL (ref 0.3–1.2)
BUN: 23 mg/dL — ABNORMAL HIGH (ref 6–20)
CO2: 24 mmol/L (ref 22–32)
Calcium: 9.4 mg/dL (ref 8.9–10.3)
Chloride: 102 mmol/L (ref 101–111)
Creatinine, Ser: 1.32 mg/dL — ABNORMAL HIGH (ref 0.61–1.24)
GFR, EST NON AFRICAN AMERICAN: 53 mL/min — AB (ref >60)
Glucose, Bld: 113 mg/dL — ABNORMAL HIGH (ref 65–99)
POTASSIUM: 4.5 mmol/L (ref 3.5–5.1)
Sodium: 133 mmol/L — ABNORMAL LOW (ref 135–145)
TOTAL PROTEIN: 7.5 g/dL (ref 6.5–8.1)

## 2016-07-17 MED ORDER — DIPHENHYDRAMINE HCL 50 MG/ML IJ SOLN
25.0000 mg | Freq: Once | INTRAMUSCULAR | Status: AC
  Administered 2016-07-17: 25 mg via INTRAVENOUS
  Filled 2016-07-17: qty 1

## 2016-07-17 MED ORDER — CARBOPLATIN CHEMO INJECTION 450 MG/45ML
180.0000 mg | Freq: Once | INTRAVENOUS | Status: AC
  Administered 2016-07-17: 180 mg via INTRAVENOUS
  Filled 2016-07-17 (×2): qty 18

## 2016-07-17 MED ORDER — SODIUM CHLORIDE 0.9 % IV SOLN: 10.0000 mg | Freq: Once | INTRAVENOUS | Status: DC

## 2016-07-17 MED ORDER — HEPARIN SOD (PORK) LOCK FLUSH 100 UNIT/ML IV SOLN
500.0000 [IU] | Freq: Once | INTRAVENOUS | Status: AC | PRN
  Administered 2016-07-17: 500 [IU]
  Filled 2016-07-17: qty 5

## 2016-07-17 MED ORDER — PALONOSETRON HCL INJECTION 0.25 MG/5ML
0.2500 mg | Freq: Once | INTRAVENOUS | Status: AC
  Administered 2016-07-17: 0.25 mg via INTRAVENOUS
  Filled 2016-07-17: qty 5

## 2016-07-17 MED ORDER — SODIUM CHLORIDE 0.9 % IV SOLN
Freq: Once | INTRAVENOUS | Status: AC
  Administered 2016-07-17: 11:00:00 via INTRAVENOUS
  Filled 2016-07-17: qty 1000

## 2016-07-17 MED ORDER — DEXAMETHASONE SODIUM PHOSPHATE 10 MG/ML IJ SOLN
10.0000 mg | Freq: Once | INTRAMUSCULAR | Status: AC
  Administered 2016-07-17: 10 mg via INTRAVENOUS
  Filled 2016-07-17: qty 3
  Filled 2016-07-17: qty 1

## 2016-07-17 MED ORDER — FAMOTIDINE IN NACL 20-0.9 MG/50ML-% IV SOLN
20.0000 mg | Freq: Once | INTRAVENOUS | Status: AC
  Administered 2016-07-17: 20 mg via INTRAVENOUS
  Filled 2016-07-17: qty 50

## 2016-07-17 MED ORDER — SODIUM CHLORIDE 0.9 % IV SOLN
45.0000 mg/m2 | Freq: Once | INTRAVENOUS | Status: AC
  Administered 2016-07-17: 96 mg via INTRAVENOUS
  Filled 2016-07-17: qty 16

## 2016-07-17 NOTE — Progress Notes (Signed)
[place]  [phone]) [phone] [phone]  ID: [name] OB: [birth year]  MR#: [id number]  URK#:[id number]  Patient Care Team: [name], MD as PCP - General (Family Medicine) [name], MD as PCP - Family Medicine (Family Medicine) [name], MD (Specialist) [name], MD as Referring Physician (Internal Medicine) [name], MD as Consulting Physician (Neurosurgery)  CHIEF COMPLAINT: Stage IIb adenocarcinoma of the lower third of the esophagus.  INTERVAL HISTORY: Patient returns to clinic today for further evaluation and consideration of cycle 2 of carboplatinum and Taxol. He tolerated his first treatment well without significant side effects. He is tolerating his daily XRT as well. He continues to have difficulty swallowing accompanied with regurgitation of food. He even states he has occasional difficulty swallowing water. His weight has remained relatively stable. He denies any recent fevers or illnesses. He has no neurologic complaints. He denies any chest pain or shortness of breath. He denies any constipation or diarrhea. He has no urinary complaints. Patient offers no further specific complaints today.  REVIEW OF SYSTEMS:   Review of Systems  Constitutional: Negative.  Negative for fever, malaise/fatigue and weight loss.  HENT: Negative.   Respiratory: Negative.  Negative for cough and shortness of breath.   Cardiovascular: Negative.  Negative for chest pain and leg swelling.  Gastrointestinal: Positive for vomiting. Negative for abdominal pain, blood in stool, melena and nausea.  Genitourinary: Negative.   Musculoskeletal: Negative.   Neurological: Negative.  Negative for weakness.  Psychiatric/Behavioral: The patient is nervous/anxious.     As per HPI. Otherwise, a complete review of systems is negative.  PAST MEDICAL HISTORY: Past Medical History:  Diagnosis Date  . Abnormal white blood cell (WBC) count    seeing Dr. [name] at [facility] on 10/25  . BPH (benign prostatic hypertrophy)   . Bradycardia   . CAD (coronary artery disease)   . Chronic kidney disease   . Dysrhythmia    bradycardia - sees Dr. Humphrey Rolls  . Esophageal cancer ([place]) 05/2016  . GERD (gastroesophageal reflux disease)   . Hyperlipidemia   . Hypertension   . Hypothyroidism   . Sleep apnea    mild - SS - Care everywhere -04/18/07  . Spinal stenosis    lumbar    PAST SURGICAL HISTORY: Past Surgical History:  Procedure Laterality Date  . BACK SURGERY    . CARDIAC CATHETERIZATION Left 03/12/2016   Procedure: Left Heart Cath and Coronary Angiography;  Surgeon: [name], MD;  Location: [facility];  Service: Cardiovascular;  Laterality: Left;  . CARDIAC CATHETERIZATION N/A 03/12/2016   Procedure: Coronary Stent Intervention;  Surgeon: Yolonda Kida, MD;  Location: [facility];  Service: Cardiovascular;  Laterality: N/A;  . COLONOSCOPY WITH PROPOFOL N/A 07/13/2015   Procedure: COLONOSCOPY WITH PROPOFOL;  Surgeon: Lucilla Lame, MD;  Location: [facility];  Service: Endoscopy;  Laterality: N/A;  . CORONARY ANGIOPLASTY  10/05 and 12/05   4 DES placed  . ESOPHAGOGASTRODUODENOSCOPY (EGD) WITH PROPOFOL N/A 06/14/2016   Procedure: ESOPHAGOGASTRODUODENOSCOPY (EGD) WITH PROPOFOL;  Surgeon: [name], MD;  Location: [facility];  Service: Endoscopy;  Laterality: N/A;  . HERNIA REPAIR     x3  . POLYPECTOMY  07/13/2015   Procedure: POLYPECTOMY;  Surgeon: Lucilla Lame, MD;  Location: [place] [facility];  Service: Endoscopy;;  . PORTACATH PLACEMENT N/A 07/08/2016   Procedure: INSERTION PORT-A-CATH;  Surgeon: Olean Ree, MD;  Location: [facility];  Service: General;  Laterality: N/A;    FAMILY HISTORY Family History  Problem Relation Age of Onset  . Hypertension Mother   . Heart disease Father   . Hypertension Father   . Kidney disease Father   . Hypertension Brother   . Diabetes Son         ADVANCED DIRECTIVES:    HEALTH MAINTENANCE: Social History  Substance Use Topics  . Smoking status: Former Smoker    Packs/day: 1.00    Years: 40.00    Types: Cigarettes    Quit date: 07/16/2004  . Smokeless tobacco: Former Systems developer    Types: Chew  . Alcohol use No     Colonoscopy:  PAP:  Bone density:  Lipid panel:  No Known Allergies  Current Outpatient Prescriptions  Medication Sig Dispense Refill  . aspirin 81 MG tablet Take 81 mg by mouth daily. AM    . docusate (COLACE) 60 MG/15ML syrup Take 15 mLs (60 mg total) by mouth daily. 200 mL 0  . isosorbide mononitrate (IMDUR) 30 MG 24 hr tablet 30 mg daily. AM  5  . levothyroxine (SYNTHROID, LEVOTHROID) 50 MCG tablet Take 1 tablet (50 mcg total) by mouth daily. 90 tablet 4  . lisinopril (PRINIVIL,ZESTRIL) 10 MG tablet Take 1 tablet (10 mg total) by mouth daily. 90 tablet 4  . multivitamin-iron-minerals-folic acid (CENTRUM) chewable tablet Chew 1 tablet by mouth daily.    . nitroGLYCERIN (NITROSTAT) 0.4 MG SL tablet Place 1 tablet under the tongue every 5 (five) minutes as needed.  98  . ondansetron (ZOFRAN) 8 MG tablet Take 1 tablet (8 mg total) by mouth 2 (two) times daily as needed for refractory nausea / vomiting. 30 tablet 1  . pantoprazole (PROTONIX) 20 MG tablet Take 1 tablet (20 mg total) by mouth daily. 90 tablet 4  . simvastatin (ZOCOR) 40 MG tablet Take 1 tablet (40 mg total) by mouth daily. 90 tablet 4  . ticagrelor (BRILINTA) 90 MG TABS tablet Take 1 tablet by mouth daily.    Marland Kitchen lidocaine-prilocaine (EMLA) cream Apply to affected area once (Patient not taking: Reported on 07/17/2016) 30 g 3   No current facility-administered medications for this visit.    Facility-Administered Medications Ordered in Other Visits  Medication Dose Route Frequency Provider Last Rate Last Dose  . heparin lock flush 100 unit/mL  500 Units Intracatheter Once PRN [name], MD        OBJECTIVE: Vitals:   07/17/16 0908   BP: 126/75  Pulse: (!) 54  Resp: 18  Temp: (!) 96.5 F (35.8 C)     Body mass index is 30.54 kg/m.    ECOG FS:0 - Asymptomatic  General: Well-developed, well-nourished, no acute distress. Eyes: Pink conjunctiva, anicteric sclera. Lungs: Clear to auscultation bilaterally. Heart: Regular rate and rhythm. No rubs, murmurs, or gallops. Abdomen: Soft, nontender, nondistended. No organomegaly noted, normoactive bowel sounds. Musculoskeletal: No edema, cyanosis, or clubbing. Neuro: Alert, answering all questions appropriately. Cranial nerves grossly intact. Skin: No rashes or petechiae noted. Psych: Normal affect.   LAB RESULTS:  Lab Results  Component Value Date   NA 133 (L) 07/17/2016   K 4.5 07/17/2016   CL 102 07/17/2016   CO2 24 07/17/2016   GLUCOSE 113 (H) 07/17/2016   BUN 23 (H) 07/17/2016   CREATININE 1.32 (H) 07/17/2016   CALCIUM 9.4 07/17/2016   PROT 7.5 07/17/2016   ALBUMIN 4.5 07/17/2016   AST 25 07/17/2016   ALT 25 07/17/2016   ALKPHOS 45 07/17/2016  BILITOT 0.7 07/17/2016   GFRNONAA 53 (L) 07/17/2016   GFRAA >60 07/17/2016    Lab Results  Component Value Date   WBC 2.6 (L) 07/17/2016   NEUTROABS 1.6 07/17/2016   HGB 13.3 07/17/2016   HCT 38.5 (L) 07/17/2016   MCV 99.8 07/17/2016   PLT 101 (L) 07/17/2016     STUDIES: Dg Chest 1 View  Result Date: 07/08/2016 CLINICAL DATA:  Port-A-Cath placement. EXAM: CHEST 1 VIEW COMPARISON:  None. FINDINGS: The left subclavian Port-A-Cath tip is 7 cm above the carina and likely just into the right atrium. No pneumothorax or hematoma is identified. The lungs are clear. No pleural effusion. IMPRESSION: Left subclavian Port-A-Cath tip is likely just below the cavoatrial junction and in the right atrium. Electronically Signed   By: Marijo Sanes M.D.   On: 07/08/2016 12:29   Dg Abdomen 1 View  Result Date: 06/22/2016 CLINICAL DATA:  Burning sensation while trying to have a bowel movement. Recent large bloody stool.  EXAM: ABDOMEN - 1 VIEW COMPARISON:  CT scan March 10, 2009 FINDINGS: High attenuation in the pelvis is likely contrast from previous CT imaging within colonic diverticuli. There are probably a few diverticula in the right colon as well. No free air, portal venous gas, or pneumatosis. No bowel obstruction. The lung bases are normal. IMPRESSION: Contrast within colonic diverticuli. No acute abnormalities are seen. Electronically Signed   By: Dorise Bullion III M.D   On: 06/22/2016 21:35   Ct Chest W Contrast  Result Date: 06/26/2016 CLINICAL DATA:  70 year old male with history of esophageal cancer diagnosed by endoscopy at [facility] 06/18/2016. Initial staging examination. EXAM: CT CHEST, ABDOMEN, AND PELVIS WITH CONTRAST TECHNIQUE: Multidetector CT imaging of the chest, abdomen and pelvis was performed following the standard protocol during bolus administration of intravenous contrast. CONTRAST:  13m ISOVUE-300 IOPAMIDOL (ISOVUE-300) INJECTION 61% COMPARISON:  CT the abdomen and pelvis 03/10/2009. FINDINGS: CT CHEST FINDINGS Cardiovascular: Heart size is normal. There is no significant pericardial fluid, thickening or pericardial calcification. There is aortic atherosclerosis, as well as atherosclerosis of the great vessels of the mediastinum and the coronary arteries, including calcified atherosclerotic plaque in the left anterior descending, ramus intermedius, left circumflex and right coronary arteries. Coronary artery stents are noted in the left anterior descending and ramus intermedius coronary arteries. Mediastinum/Nodes: Distal esophageal mass at the level of the gastroesophageal junction measures approximately 3.0 x 3.1 x 4.9 cm (axial image 51 of series 2 and sagittal image 114 of series 6). More proximal aspect of the esophagus is otherwise unremarkable. No pathologically enlarged mediastinal or hilar lymph nodes. No axillary lymphadenopathy. Lungs/Pleura: There multiple tiny 2-4 mm pulmonary nodules  scattered throughout the lungs bilaterally, nonspecific, but favored to be benign. Most of these are associated with the fissures, and are likely to represent subpleural lymph nodes. No other larger more suspicious appearing pulmonary nodules or masses are noted. There is no acute consolidative airspace disease. No pleural effusions. Musculoskeletal: There are no aggressive appearing lytic or blastic lesions noted in the visualized portions of the skeleton. CT ABDOMEN PELVIS FINDINGS Hepatobiliary: No cystic or solid hepatic lesions. No intra or extrahepatic biliary ductal dilatation. Gallbladder is normal in appearance. Pancreas: No pancreatic mass. No pancreatic ductal dilatation. No pancreatic or peripancreatic fluid or inflammatory changes. Spleen: Unremarkable. Adrenals/Urinary Tract: Sub cm low-attenuation lesion in the upper pole of the left kidney is too small to definitively characterize, but appears similar in retrospect to prior study 03/10/2009, likely a cyst. Right kidney  and bilateral adrenal glands are normal in appearance. There is no hydroureteronephrosis. Urinary bladder is normal in appearance. Stomach/Bowel: The mass in the distal esophagus is at the level of the gastroesophageal junction, and may extend to involve the very proximal aspect of the cardia of the stomach (this is uncertain). More distal aspect of the stomach is otherwise unremarkable in appearance. No pathologic dilatation of small bowel or colon. Numerous colonic diverticulae are noted, particularly in the sigmoid colon, without surrounding inflammatory changes to suggest an acute diverticulitis at this time. Normal appendix. Vascular/Lymphatic: Aortic atherosclerosis, with ectasia of the distal infrarenal abdominal aorta which measures up to 2.8 cm in diameter shortly before the level of the aortic bifurcation. Prominent but nonenlarged 7 mm short axis celiac axis lymph node is noted, but is nonspecific. No lymphadenopathy noted  in the abdomen or pelvis. Reproductive: Prostate gland and seminal vesicles are unremarkable in appearance. Other: Small right inguinal hernia containing only fat incidentally noted. No significant volume of ascites. No pneumoperitoneum. Musculoskeletal: There are no aggressive appearing lytic or blastic lesions noted in the visualized portions of the skeleton. IMPRESSION: 1. Distal esophageal mass measuring approximately 3.0 x 3.1 x 4.9 cm centered at the level of the gastroesophageal junction, with potential extension to involve the most proximal aspect of the cardia. There is no surrounding lymphadenopathy, and no definite evidence of metastatic disease in the chest, abdomen or pelvis. 2. Several tiny 2-4 mm pulmonary nodules scattered throughout the lungs bilaterally are considered highly nonspecific. As most of these are subpleural in location, these are favored to represent subpleural lymph nodes, however, attention on followup studies is recommended to ensure the stability or resolution of these findings. 3. Aortic atherosclerosis, in addition to three-vessel coronary artery disease. Please note that although the presence of coronary artery calcium documents the presence of coronary artery disease, the severity of this disease and any potential stenosis cannot be assessed on this non-gated CT examination. Assessment for potential risk factor modification, dietary therapy or pharmacologic therapy may be warranted, if clinically indicated. 4. Colonic diverticulosis without evidence of acute diverticulitis at this time. 5. Additional incidental findings, as above. Electronically Signed   By: Vinnie Langton M.D.   On: 06/26/2016 11:33   Ct Abdomen Pelvis W Contrast  Result Date: 06/26/2016 CLINICAL DATA:  70 year old male with history of esophageal cancer diagnosed by endoscopy at [facility] 06/18/2016. Initial staging examination. EXAM: CT CHEST, ABDOMEN, AND PELVIS WITH CONTRAST TECHNIQUE: Multidetector CT  imaging of the chest, abdomen and pelvis was performed following the standard protocol during bolus administration of intravenous contrast. CONTRAST:  38m ISOVUE-300 IOPAMIDOL (ISOVUE-300) INJECTION 61% COMPARISON:  CT the abdomen and pelvis 03/10/2009. FINDINGS: CT CHEST FINDINGS Cardiovascular: Heart size is normal. There is no significant pericardial fluid, thickening or pericardial calcification. There is aortic atherosclerosis, as well as atherosclerosis of the great vessels of the mediastinum and the coronary arteries, including calcified atherosclerotic plaque in the left anterior descending, ramus intermedius, left circumflex and right coronary arteries. Coronary artery stents are noted in the left anterior descending and ramus intermedius coronary arteries. Mediastinum/Nodes: Distal esophageal mass at the level of the gastroesophageal junction measures approximately 3.0 x 3.1 x 4.9 cm (axial image 51 of series 2 and sagittal image 114 of series 6). More proximal aspect of the esophagus is otherwise unremarkable. No pathologically enlarged mediastinal or hilar lymph nodes. No axillary lymphadenopathy. Lungs/Pleura: There multiple tiny 2-4 mm pulmonary nodules scattered throughout the lungs bilaterally, nonspecific, but favored to be benign.  Most of these are associated with the fissures, and are likely to represent subpleural lymph nodes. No other larger more suspicious appearing pulmonary nodules or masses are noted. There is no acute consolidative airspace disease. No pleural effusions. Musculoskeletal: There are no aggressive appearing lytic or blastic lesions noted in the visualized portions of the skeleton. CT ABDOMEN PELVIS FINDINGS Hepatobiliary: No cystic or solid hepatic lesions. No intra or extrahepatic biliary ductal dilatation. Gallbladder is normal in appearance. Pancreas: No pancreatic mass. No pancreatic ductal dilatation. No pancreatic or peripancreatic fluid or inflammatory changes. Spleen:  Unremarkable. Adrenals/Urinary Tract: Sub cm low-attenuation lesion in the upper pole of the left kidney is too small to definitively characterize, but appears similar in retrospect to prior study 03/10/2009, likely a cyst. Right kidney and bilateral adrenal glands are normal in appearance. There is no hydroureteronephrosis. Urinary bladder is normal in appearance. Stomach/Bowel: The mass in the distal esophagus is at the level of the gastroesophageal junction, and may extend to involve the very proximal aspect of the cardia of the stomach (this is uncertain). More distal aspect of the stomach is otherwise unremarkable in appearance. No pathologic dilatation of small bowel or colon. Numerous colonic diverticulae are noted, particularly in the sigmoid colon, without surrounding inflammatory changes to suggest an acute diverticulitis at this time. Normal appendix. Vascular/Lymphatic: Aortic atherosclerosis, with ectasia of the distal infrarenal abdominal aorta which measures up to 2.8 cm in diameter shortly before the level of the aortic bifurcation. Prominent but nonenlarged 7 mm short axis celiac axis lymph node is noted, but is nonspecific. No lymphadenopathy noted in the abdomen or pelvis. Reproductive: Prostate gland and seminal vesicles are unremarkable in appearance. Other: Small right inguinal hernia containing only fat incidentally noted. No significant volume of ascites. No pneumoperitoneum. Musculoskeletal: There are no aggressive appearing lytic or blastic lesions noted in the visualized portions of the skeleton. IMPRESSION: 1. Distal esophageal mass measuring approximately 3.0 x 3.1 x 4.9 cm centered at the level of the gastroesophageal junction, with potential extension to involve the most proximal aspect of the cardia. There is no surrounding lymphadenopathy, and no definite evidence of metastatic disease in the chest, abdomen or pelvis. 2. Several tiny 2-4 mm pulmonary nodules scattered throughout the  lungs bilaterally are considered highly nonspecific. As most of these are subpleural in location, these are favored to represent subpleural lymph nodes, however, attention on followup studies is recommended to ensure the stability or resolution of these findings. 3. Aortic atherosclerosis, in addition to three-vessel coronary artery disease. Please note that although the presence of coronary artery calcium documents the presence of coronary artery disease, the severity of this disease and any potential stenosis cannot be assessed on this non-gated CT examination. Assessment for potential risk factor modification, dietary therapy or pharmacologic therapy may be warranted, if clinically indicated. 4. Colonic diverticulosis without evidence of acute diverticulitis at this time. 5. Additional incidental findings, as above. Electronically Signed   By: Vinnie Langton M.D.   On: 06/26/2016 11:33   Nm Pet Image Initial (pi) Skull Base To Thigh  Result Date: 07/03/2016 CLINICAL DATA:  Initial treatment strategy for esophageal carcinoma. EXAM: NUCLEAR MEDICINE PET SKULL BASE TO THIGH TECHNIQUE: 12.1 mCi F-18 FDG was injected intravenously. Full-ring PET imaging was performed from the skull base to thigh after the radiotracer. CT data was obtained and used for attenuation correction and anatomic localization. FASTING BLOOD GLUCOSE:  Value: 100 mg/dl COMPARISON:  CT 06/26/2016 FINDINGS: NECK No hypermetabolic lymph nodes in the neck.  CHEST No hypermetabolic mediastinal or hilar nodes. No suspicious pulmonary nodules on the CT scan. ABDOMEN/PELVIS Hypermetabolic thickening the gastroesophageal junction measures 3.5 cm with SUV max equals 7.5. No hypermetabolic gastrohepatic ligament lymph nodes. No hypermetabolic foci within the liver. No hypermetabolic abdominal lymph nodes, periaortic lymph nodes or pelvic lymph nodes. Incidental finding of multiple sigmoid diverticula. Atherosclerotic calcification of the aorta.  SKELETON No focal hypermetabolic activity to suggest skeletal metastasis. IMPRESSION: 1. Hypermetabolic thickening at the gastroesophageal junction consists with primary esophageal adenocarcinoma. 2. No evidence of local nodal metastasis within the gastrohepatic ligament or mediastinum. 3. No evidence of liver metastasis or distant metastasis Electronically Signed   By: Suzy Bouchard M.D.   On: 07/03/2016 15:07   Dg C-arm 1-60 Min-no Report  Result Date: 07/08/2016 CLINICAL DATA: surgery, port placement C-ARM 1-60 MINUTES Fluoroscopy was utilized by the requesting physician.  No radiographic interpretation.    ASSESSMENT: Stage IIb adenocarcinoma of the lower third of the esophagus.  PLAN:    1. Stage IIb adenocarcinoma of the lower third of the esophagus: Patient had EUS completed at [facility] confirming the stage and diagnosis. PET scan results reviewed independently and reported as above with no obvious evidence of metastatic disease.  Patient will benefit from neoadjuvant chemotherapy and XRT, possibly followed by esophagectomy. He was seen at [facility] yesterday and it was determined that no feeding tube is necessary at this time. Patient has a follow-up appointment in early January for preadmission screening and further evaluation for esophagectomy. Proceed with cycle 2 of weekly carboplatinum and Taxol today. Continue daily XRT. Port placement has been requested, but this will not delay initiation of treatment. 2. Esophageal obstruction: Patient has progressive difficulty swallowing foods and although he has not lost any weight up to this point, there is concern of maintaining his nutritional status. By report, patient also required a pediatric scope during his EGD secondary to his stenosis. Patient had consultation with dietary. No feeding tube is necessary at this point, but will consider one in the future if his nutritional status declines. 3. Thrombocytopenia: Previously  entire workup did not reveal a distinct etiology. No bone marrow biopsy has been performed to this point. Continue to monitor closely. 4. Leukopenia: Patient's white blood cell count is decreased, but approximately his baseline. Continue to monitor. 5. Renal insufficiency: Patient's creatinine is slightly elevated, possibly secondary to mild dehydration. Monitor.   Patient expressed understanding and was in agreement with this plan. He also understands that He can call clinic at any time with any questions, concerns, or complaints.    [name], MD   07/17/2016 1:55 PM

## 2016-07-17 NOTE — Progress Notes (Signed)
States is feeling well. Offers no complaints. Tolerated first chemo treatment well without side effects.

## 2016-07-18 ENCOUNTER — Ambulatory Visit
Admission: RE | Admit: 2016-07-18 | Discharge: 2016-07-18 | Disposition: A | Discharge status: Home / Self Care | Payer: Medicare Other | Source: Ambulatory Visit | Attending: Radiation Oncology | Admitting: Radiation Oncology

## 2016-07-18 ENCOUNTER — Other Ambulatory Visit: Payer: Self-pay | Admitting: *Deleted

## 2016-07-18 ENCOUNTER — Ambulatory Visit: Payer: Medicare Other

## 2016-07-18 DIAGNOSIS — C155 Malignant neoplasm of lower third of esophagus: Secondary | ICD-10-CM | POA: Diagnosis not present

## 2016-07-18 MED ORDER — PROCHLORPERAZINE MALEATE 10 MG PO TABS: 10.0000 mg | ORAL_TABLET | Freq: Four times a day (QID) | ORAL | 0 refills | Status: DC | PRN

## 2016-07-19 ENCOUNTER — Ambulatory Visit
Admission: RE | Admit: 2016-07-19 | Discharge: 2016-07-19 | Disposition: A | Discharge status: Home / Self Care | Payer: Medicare Other | Source: Ambulatory Visit | Attending: Radiation Oncology | Admitting: Radiation Oncology

## 2016-07-19 ENCOUNTER — Ambulatory Visit: Payer: Medicare Other

## 2016-07-19 DIAGNOSIS — C155 Malignant neoplasm of lower third of esophagus: Secondary | ICD-10-CM | POA: Diagnosis not present

## 2016-07-22 ENCOUNTER — Ambulatory Visit
Admission: RE | Admit: 2016-07-22 | Discharge: 2016-07-22 | Disposition: A | Discharge status: Home / Self Care | Payer: Medicare Other | Source: Ambulatory Visit | Attending: Radiation Oncology | Admitting: Radiation Oncology

## 2016-07-22 ENCOUNTER — Ambulatory Visit: Payer: Medicare Other

## 2016-07-22 DIAGNOSIS — C155 Malignant neoplasm of lower third of esophagus: Secondary | ICD-10-CM | POA: Diagnosis not present

## 2016-07-23 ENCOUNTER — Ambulatory Visit
Admission: RE | Admit: 2016-07-23 | Discharge: 2016-07-23 | Disposition: A | Discharge status: Home / Self Care | Payer: Medicare Other | Source: Ambulatory Visit | Attending: Radiation Oncology | Admitting: Radiation Oncology

## 2016-07-23 ENCOUNTER — Other Ambulatory Visit: Payer: Self-pay | Admitting: *Deleted

## 2016-07-23 ENCOUNTER — Ambulatory Visit: Payer: Medicare Other

## 2016-07-23 DIAGNOSIS — C155 Malignant neoplasm of lower third of esophagus: Secondary | ICD-10-CM | POA: Diagnosis not present

## 2016-07-23 MED ORDER — SUCRALFATE 1 G PO TABS: 1.0000 g | ORAL_TABLET | Freq: Three times a day (TID) | ORAL | 3 refills | Status: DC

## 2016-07-24 ENCOUNTER — Ambulatory Visit: Payer: Medicare Other

## 2016-07-24 ENCOUNTER — Inpatient Hospital Stay (HOSPITAL_BASED_OUTPATIENT_CLINIC_OR_DEPARTMENT_OTHER): Payer: Medicare Other | Admitting: Oncology

## 2016-07-24 ENCOUNTER — Inpatient Hospital Stay: Payer: Medicare Other

## 2016-07-24 ENCOUNTER — Ambulatory Visit
Admission: RE | Admit: 2016-07-24 | Discharge: 2016-07-24 | Disposition: A | Discharge status: Home / Self Care | Payer: Medicare Other | Source: Ambulatory Visit | Attending: Radiation Oncology | Admitting: Radiation Oncology

## 2016-07-24 VITALS — BP 119/66 | HR 71 | Temp 97.2°F | Resp 18 | Wt 201.9 lb

## 2016-07-24 DIAGNOSIS — I2510 Atherosclerotic heart disease of native coronary artery without angina pectoris: Secondary | ICD-10-CM

## 2016-07-24 DIAGNOSIS — R112 Nausea with vomiting, unspecified: Secondary | ICD-10-CM

## 2016-07-24 DIAGNOSIS — K219 Gastro-esophageal reflux disease without esophagitis: Secondary | ICD-10-CM

## 2016-07-24 DIAGNOSIS — E785 Hyperlipidemia, unspecified: Secondary | ICD-10-CM

## 2016-07-24 DIAGNOSIS — I129 Hypertensive chronic kidney disease with stage 1 through stage 4 chronic kidney disease, or unspecified chronic kidney disease: Secondary | ICD-10-CM

## 2016-07-24 DIAGNOSIS — R918 Other nonspecific abnormal finding of lung field: Secondary | ICD-10-CM

## 2016-07-24 DIAGNOSIS — D696 Thrombocytopenia, unspecified: Secondary | ICD-10-CM | POA: Diagnosis not present

## 2016-07-24 DIAGNOSIS — N189 Chronic kidney disease, unspecified: Secondary | ICD-10-CM

## 2016-07-24 DIAGNOSIS — G4730 Sleep apnea, unspecified: Secondary | ICD-10-CM

## 2016-07-24 DIAGNOSIS — R001 Bradycardia, unspecified: Secondary | ICD-10-CM

## 2016-07-24 DIAGNOSIS — K222 Esophageal obstruction: Secondary | ICD-10-CM

## 2016-07-24 DIAGNOSIS — C155 Malignant neoplasm of lower third of esophagus: Secondary | ICD-10-CM | POA: Diagnosis not present

## 2016-07-24 DIAGNOSIS — E039 Hypothyroidism, unspecified: Secondary | ICD-10-CM

## 2016-07-24 DIAGNOSIS — M48061 Spinal stenosis, lumbar region without neurogenic claudication: Secondary | ICD-10-CM

## 2016-07-24 DIAGNOSIS — D72819 Decreased white blood cell count, unspecified: Secondary | ICD-10-CM

## 2016-07-24 DIAGNOSIS — K4090 Unilateral inguinal hernia, without obstruction or gangrene, not specified as recurrent: Secondary | ICD-10-CM

## 2016-07-24 DIAGNOSIS — Z87891 Personal history of nicotine dependence: Secondary | ICD-10-CM

## 2016-07-24 DIAGNOSIS — K5730 Diverticulosis of large intestine without perforation or abscess without bleeding: Secondary | ICD-10-CM

## 2016-07-24 DIAGNOSIS — R1310 Dysphagia, unspecified: Secondary | ICD-10-CM

## 2016-07-24 DIAGNOSIS — Z7982 Long term (current) use of aspirin: Secondary | ICD-10-CM

## 2016-07-24 DIAGNOSIS — F419 Anxiety disorder, unspecified: Secondary | ICD-10-CM

## 2016-07-24 DIAGNOSIS — I700 Atherosclerosis of aorta: Secondary | ICD-10-CM

## 2016-07-24 DIAGNOSIS — Z923 Personal history of irradiation: Secondary | ICD-10-CM

## 2016-07-24 DIAGNOSIS — K921 Melena: Secondary | ICD-10-CM

## 2016-07-24 DIAGNOSIS — N400 Enlarged prostate without lower urinary tract symptoms: Secondary | ICD-10-CM

## 2016-07-24 LAB — CBC WITH DIFFERENTIAL/PLATELET
BASOS PCT: 0 %
Basophils Absolute: 0 10*3/uL (ref 0–0.1)
EOS ABS: 0 10*3/uL (ref 0–0.7)
EOS PCT: 1 %
HCT: 37.5 % — ABNORMAL LOW (ref 40.0–52.0)
HEMOGLOBIN: 13 g/dL (ref 13.0–18.0)
LYMPHS ABS: 0.4 10*3/uL — AB (ref 1.0–3.6)
Lymphocytes Relative: 17 %
MCH: 34.5 pg — AB (ref 26.0–34.0)
MCHC: 34.5 g/dL (ref 32.0–36.0)
MCV: 100 fL (ref 80.0–100.0)
MONOS PCT: 12 %
Monocytes Absolute: 0.3 10*3/uL (ref 0.2–1.0)
NEUTROS PCT: 70 %
Neutro Abs: 1.5 10*3/uL (ref 1.4–6.5)
PLATELETS: 103 10*3/uL — AB (ref 150–440)
RBC: 3.75 MIL/uL — AB (ref 4.40–5.90)
RDW: 14 % (ref 11.5–14.5)
WBC: 2.2 10*3/uL — AB (ref 3.8–10.6)

## 2016-07-24 LAB — COMPREHENSIVE METABOLIC PANEL
ALBUMIN: 4.4 g/dL (ref 3.5–5.0)
ALK PHOS: 41 U/L (ref 38–126)
ALT: 23 U/L (ref 17–63)
ANION GAP: 5 (ref 5–15)
AST: 23 U/L (ref 15–41)
BUN: 24 mg/dL — ABNORMAL HIGH (ref 6–20)
CHLORIDE: 104 mmol/L (ref 101–111)
CO2: 26 mmol/L (ref 22–32)
Calcium: 9.2 mg/dL (ref 8.9–10.3)
Creatinine, Ser: 1.28 mg/dL — ABNORMAL HIGH (ref 0.61–1.24)
GFR calc non Af Amer: 55 mL/min — ABNORMAL LOW (ref >60)
GLUCOSE: 109 mg/dL — AB (ref 65–99)
POTASSIUM: 4.8 mmol/L (ref 3.5–5.1)
SODIUM: 135 mmol/L (ref 135–145)
Total Bilirubin: 0.8 mg/dL (ref 0.3–1.2)
Total Protein: 7.3 g/dL (ref 6.5–8.1)

## 2016-07-24 MED ORDER — SODIUM CHLORIDE 0.9% FLUSH
10.0000 mL | INTRAVENOUS | Status: DC | PRN
  Filled 2016-07-24: qty 10

## 2016-07-24 MED ORDER — PALONOSETRON HCL INJECTION 0.25 MG/5ML
0.2500 mg | Freq: Once | INTRAVENOUS | Status: AC
  Administered 2016-07-24: 0.25 mg via INTRAVENOUS
  Filled 2016-07-24: qty 5

## 2016-07-24 MED ORDER — SODIUM CHLORIDE 0.9 % IV SOLN
Freq: Once | INTRAVENOUS | Status: AC
  Administered 2016-07-24: 11:00:00 via INTRAVENOUS
  Filled 2016-07-24: qty 1000

## 2016-07-24 MED ORDER — HEPARIN SOD (PORK) LOCK FLUSH 100 UNIT/ML IV SOLN
500.0000 [IU] | Freq: Once | INTRAVENOUS | Status: AC | PRN
  Administered 2016-07-24: 500 [IU]
  Filled 2016-07-24: qty 5

## 2016-07-24 MED ORDER — FAMOTIDINE IN NACL 20-0.9 MG/50ML-% IV SOLN
20.0000 mg | Freq: Once | INTRAVENOUS | Status: AC
  Administered 2016-07-24: 20 mg via INTRAVENOUS
  Filled 2016-07-24: qty 50

## 2016-07-24 MED ORDER — SODIUM CHLORIDE 0.9 % IV SOLN
45.0000 mg/m2 | Freq: Once | INTRAVENOUS | Status: AC
  Administered 2016-07-24: 96 mg via INTRAVENOUS
  Filled 2016-07-24: qty 16

## 2016-07-24 MED ORDER — SODIUM CHLORIDE 0.9 % IV SOLN: 10.0000 mg | Freq: Once | INTRAVENOUS | Status: DC

## 2016-07-24 MED ORDER — DEXAMETHASONE SODIUM PHOSPHATE 10 MG/ML IJ SOLN
10.0000 mg | Freq: Once | INTRAMUSCULAR | Status: AC
  Administered 2016-07-24: 10 mg via INTRAVENOUS
  Filled 2016-07-24: qty 3
  Filled 2016-07-24: qty 1

## 2016-07-24 MED ORDER — DIPHENHYDRAMINE HCL 50 MG/ML IJ SOLN
25.0000 mg | Freq: Once | INTRAMUSCULAR | Status: AC
  Administered 2016-07-24: 25 mg via INTRAVENOUS
  Filled 2016-07-24: qty 1

## 2016-07-24 MED ORDER — SODIUM CHLORIDE 0.9 % IJ SOLN
10.0000 mL | Freq: Once | INTRAMUSCULAR | Status: AC
  Administered 2016-07-24: 10 mL via INTRAVENOUS
  Filled 2016-07-24: qty 10

## 2016-07-24 MED ORDER — SODIUM CHLORIDE 0.9 % IV SOLN
180.0000 mg | Freq: Once | INTRAVENOUS | Status: AC
  Administered 2016-07-24: 180 mg via INTRAVENOUS
  Filled 2016-07-24: qty 18

## 2016-07-24 MED ORDER — HEPARIN SOD (PORK) LOCK FLUSH 100 UNIT/ML IV SOLN: 500.0000 [IU] | Freq: Once | INTRAVENOUS | Status: DC

## 2016-07-24 NOTE — Progress Notes (Signed)
Offers no complaints  

## 2016-07-24 NOTE — Progress Notes (Signed)
Corey Perry  [phone] [phone]) [phone]  ID: Corey Perry OB: 1946/08/07  MR#: [id number]  KGS#:[id number]  Patient Care Team: [name], MD as PCP - General (Family Medicine) [name], MD as PCP - Family Medicine (Family Medicine) [name], MD (Specialist) [name], MD as Referring Physician (Internal Medicine) [name], MD as Consulting Physician (Neurosurgery)  CHIEF COMPLAINT: Stage IIb adenocarcinoma of the lower third of the esophagus.  INTERVAL HISTORY: Patient returns to clinic today for further evaluation and consideration of cycle 3 of carboplatinum and Taxol. He is tolerating his treatments well without significant side effects. He is tolerating his daily XRT as well. He continues to have difficulty swallowing accompanied with regurgitation of food, but states this is mildly improved. His weight continues to trend down slowly. He denies any recent fevers or illnesses. He has no neurologic complaints. He denies any chest pain or shortness of breath. He denies any constipation or diarrhea. He has no urinary complaints. Patient offers no further specific complaints today.  REVIEW OF SYSTEMS:   Review of Systems  Constitutional: Positive for weight loss. Negative for fever and malaise/fatigue.  HENT: Negative.   Respiratory: Negative.  Negative for cough and shortness of breath.   Cardiovascular: Negative.  Negative for chest pain and leg swelling.  Gastrointestinal: Positive for vomiting. Negative for abdominal pain, blood in stool, melena and nausea.  Genitourinary: Negative.   Musculoskeletal: Negative.   Neurological: Negative.  Negative for weakness.  Psychiatric/Behavioral: The patient is nervous/anxious.     As per HPI. Otherwise, a complete review of systems is negative.  PAST MEDICAL HISTORY: Past Medical History:  Diagnosis Date  . Abnormal white blood cell (WBC) count    seeing Dr. [name] at  [facility] on 10/25  . BPH (benign prostatic hypertrophy)   . Bradycardia   . CAD (coronary artery disease)   . Chronic kidney disease   . Dysrhythmia    bradycardia - sees Dr. Humphrey Rolls  . Esophageal cancer ([place]) 05/2016  . GERD (gastroesophageal reflux disease)   . Hyperlipidemia   . Hypertension   . Hypothyroidism   . Sleep apnea    mild - SS - Care everywhere -04/18/07  . Spinal stenosis    lumbar    PAST SURGICAL HISTORY: Past Surgical History:  Procedure Laterality Date  . BACK SURGERY    . CARDIAC CATHETERIZATION Left 03/12/2016   Procedure: Left Heart Cath and Coronary Angiography;  Surgeon: [name], MD;  Location: [facility];  Service: Cardiovascular;  Laterality: Left;  . CARDIAC CATHETERIZATION N/A 03/12/2016   Procedure: Coronary Stent Intervention;  Surgeon: Yolonda Kida, MD;  Location: [place] [facility];  Service: Cardiovascular;  Laterality: N/A;  . COLONOSCOPY WITH PROPOFOL N/A 07/13/2015   Procedure: COLONOSCOPY WITH PROPOFOL;  Surgeon: Lucilla Lame, MD;  Location: [facility];  Service: Endoscopy;  Laterality: N/A;  . CORONARY ANGIOPLASTY  10/05 and 12/05   4 DES placed  . ESOPHAGOGASTRODUODENOSCOPY (EGD) WITH PROPOFOL N/A 06/14/2016   Procedure: ESOPHAGOGASTRODUODENOSCOPY (EGD) WITH PROPOFOL;  Surgeon: [name], MD;  Location: [facility] END[facility]COPY;  Service: Endoscopy;  Laterality: N/A;  . HERNIA REPAIR     x3  . POLYPECTOMY  07/13/2015   Procedure: POLYPECTOMY;  Surgeon: Lucilla Lame, MD;  Location: [place];  Service: Endoscopy;;  . PORTACATH PLACEMENT N/A 07/08/2016   Procedure: INSERTION PORT-A-CATH;  Surgeon: Olean Ree, MD;  Location: [facility];  Service: General;  Laterality:  N/A;    FAMILY HISTORY Family History  Problem Relation Age of Onset  . Hypertension Mother   . Heart disease Father   . Hypertension Father   . Kidney disease Father   . Hypertension Brother   . Diabetes Son        ADVANCED  DIRECTIVES:    HEALTH MAINTENANCE: Social History  Substance Use Topics  . Smoking status: Former Smoker    Packs/day: 1.00    Years: 40.00    Types: Cigarettes    Quit date: 07/16/2004  . Smokeless tobacco: Former Systems developer    Types: Chew  . Alcohol use No     Colonoscopy:  PAP:  Bone density:  Lipid panel:  No Known Allergies  Current Outpatient Prescriptions  Medication Sig Dispense Refill  . aspirin 81 MG tablet Take 81 mg by mouth daily. AM    . docusate (COLACE) 60 MG/15ML syrup Take 15 mLs (60 mg total) by mouth daily. 200 mL 0  . isosorbide mononitrate (IMDUR) 30 MG 24 hr tablet 30 mg daily. AM  5  . levothyroxine (SYNTHROID, LEVOTHROID) 50 MCG tablet Take 1 tablet (50 mcg total) by mouth daily. 90 tablet 4  . lisinopril (PRINIVIL,ZESTRIL) 10 MG tablet Take 1 tablet (10 mg total) by mouth daily. 90 tablet 4  . multivitamin-iron-minerals-folic acid (CENTRUM) chewable tablet Chew 1 tablet by mouth daily.    . nitroGLYCERIN (NITROSTAT) 0.4 MG SL tablet Place 1 tablet under the tongue every 5 (five) minutes as needed.  98  . ondansetron (ZOFRAN) 8 MG tablet Take 1 tablet (8 mg total) by mouth 2 (two) times daily as needed for refractory nausea / vomiting. 30 tablet 1  . pantoprazole (PROTONIX) 20 MG tablet Take 1 tablet (20 mg total) by mouth daily. 90 tablet 4  . prochlorperazine (COMPAZINE) 10 MG tablet Take 1 tablet (10 mg total) by mouth every 6 (six) hours as needed for nausea or vomiting. 30 tablet 0  . simvastatin (ZOCOR) 40 MG tablet Take 1 tablet (40 mg total) by mouth daily. 90 tablet 4  . sucralfate (CARAFATE) 1 g tablet Take 1 tablet (1 g total) by mouth 3 (three) times daily. Dissolve in 2-3 tbsp of warm water; swish and swallow 90 tablet 3  . ticagrelor (BRILINTA) 90 MG TABS tablet Take 1 tablet by mouth daily.     No current facility-administered medications for this visit.     OBJECTIVE: Vitals:   07/24/16 0857  BP: 119/66  Pulse: 71  Resp: 18  Temp:  97.2 F (36.2 C)     Body mass index is 29.82 kg/m.    ECOG FS:0 - Asymptomatic  General: Well-developed, well-nourished, no acute distress. Eyes: Pink conjunctiva, anicteric sclera. Lungs: Clear to auscultation bilaterally. Heart: Regular rate and rhythm. No rubs, murmurs, or gallops. Abdomen: Soft, nontender, nondistended. No organomegaly noted, normoactive bowel sounds. Musculoskeletal: No edema, cyanosis, or clubbing. Neuro: Alert, answering all questions appropriately. Cranial nerves grossly intact. Skin: No rashes or petechiae noted. Psych: Normal affect.   LAB RESULTS:  Lab Results  Component Value Date   NA 133 (L) 07/17/2016   K 4.5 07/17/2016   CL 102 07/17/2016   CO2 24 07/17/2016   GLUCOSE 113 (H) 07/17/2016   BUN 23 (H) 07/17/2016   CREATININE 1.32 (H) 07/17/2016   CALCIUM 9.4 07/17/2016   PROT 7.5 07/17/2016   ALBUMIN 4.5 07/17/2016   AST 25 07/17/2016   ALT 25 07/17/2016   ALKPHOS 45 07/17/2016  BILITOT 0.7 07/17/2016   GFRNONAA 53 (L) 07/17/2016   GFRAA >60 07/17/2016    Lab Results  Component Value Date   WBC 2.2 (L) 07/24/2016   NEUTROABS 1.5 07/24/2016   HGB 13.0 07/24/2016   HCT 37.5 (L) 07/24/2016   MCV 100.0 07/24/2016   PLT 103 (L) 07/24/2016     STUDIES: Dg Chest 1 View  Result Date: 07/08/2016 CLINICAL DATA:  Port-A-Cath placement. EXAM: CHEST 1 VIEW COMPARISON:  None. FINDINGS: The left subclavian Port-A-Cath tip is 7 cm above the carina and likely just into the right atrium. No pneumothorax or hematoma is identified. The lungs are clear. No pleural effusion. IMPRESSION: Left subclavian Port-A-Cath tip is likely just below the cavoatrial junction and in the right atrium. Electronically Signed   By: Marijo Sanes M.D.   On: 07/08/2016 12:29   Ct Chest W Contrast  Result Date: 06/26/2016 CLINICAL DATA:  70 year old male with history of esophageal cancer diagnosed by endoscopy at [facility] 06/18/2016. Initial staging examination. EXAM: CT  CHEST, ABDOMEN, AND PELVIS WITH CONTRAST TECHNIQUE: Multidetector CT imaging of the chest, abdomen and pelvis was performed following the standard protocol during bolus administration of intravenous contrast. CONTRAST:  37m ISOVUE-300 IOPAMIDOL (ISOVUE-300) INJECTION 61% COMPARISON:  CT the abdomen and pelvis 03/10/2009. FINDINGS: CT CHEST FINDINGS Cardiovascular: Heart size is normal. There is no significant pericardial fluid, thickening or pericardial calcification. There is aortic atherosclerosis, as well as atherosclerosis of the great vessels of the mediastinum and the coronary arteries, including calcified atherosclerotic plaque in the left anterior descending, ramus intermedius, left circumflex and right coronary arteries. Coronary artery stents are noted in the left anterior descending and ramus intermedius coronary arteries. Mediastinum/Nodes: Distal esophageal mass at the level of the gastroesophageal junction measures approximately 3.0 x 3.1 x 4.9 cm (axial image 51 of series 2 and sagittal image 114 of series 6). More proximal aspect of the esophagus is otherwise unremarkable. No pathologically enlarged mediastinal or hilar lymph nodes. No axillary lymphadenopathy. Lungs/Pleura: There multiple tiny 2-4 mm pulmonary nodules scattered throughout the lungs bilaterally, nonspecific, but favored to be benign. Most of these are associated with the fissures, and are likely to represent subpleural lymph nodes. No other larger more suspicious appearing pulmonary nodules or masses are noted. There is no acute consolidative airspace disease. No pleural effusions. Musculoskeletal: There are no aggressive appearing lytic or blastic lesions noted in the visualized portions of the skeleton. CT ABDOMEN PELVIS FINDINGS Hepatobiliary: No cystic or solid hepatic lesions. No intra or extrahepatic biliary ductal dilatation. Gallbladder is normal in appearance. Pancreas: No pancreatic mass. No pancreatic ductal dilatation.  No pancreatic or peripancreatic fluid or inflammatory changes. Spleen: Unremarkable. Adrenals/Urinary Tract: Sub cm low-attenuation lesion in the upper pole of the left kidney is too small to definitively characterize, but appears similar in retrospect to prior study 03/10/2009, likely a cyst. Right kidney and bilateral adrenal glands are normal in appearance. There is no hydroureteronephrosis. Urinary bladder is normal in appearance. Stomach/Bowel: The mass in the distal esophagus is at the level of the gastroesophageal junction, and may extend to involve the very proximal aspect of the cardia of the stomach (this is uncertain). More distal aspect of the stomach is otherwise unremarkable in appearance. No pathologic dilatation of small bowel or colon. Numerous colonic diverticulae are noted, particularly in the sigmoid colon, without surrounding inflammatory changes to suggest an acute diverticulitis at this time. Normal appendix. Vascular/Lymphatic: Aortic atherosclerosis, with ectasia of the distal infrarenal abdominal aorta which measures  up to 2.8 cm in diameter shortly before the level of the aortic bifurcation. Prominent but nonenlarged 7 mm short axis celiac axis lymph node is noted, but is nonspecific. No lymphadenopathy noted in the abdomen or pelvis. Reproductive: Prostate gland and seminal vesicles are unremarkable in appearance. Other: Small right inguinal hernia containing only fat incidentally noted. No significant volume of ascites. No pneumoperitoneum. Musculoskeletal: There are no aggressive appearing lytic or blastic lesions noted in the visualized portions of the skeleton. IMPRESSION: 1. Distal esophageal mass measuring approximately 3.0 x 3.1 x 4.9 cm centered at the level of the gastroesophageal junction, with potential extension to involve the most proximal aspect of the cardia. There is no surrounding lymphadenopathy, and no definite evidence of metastatic disease in the chest, abdomen or  pelvis. 2. Several tiny 2-4 mm pulmonary nodules scattered throughout the lungs bilaterally are considered highly nonspecific. As most of these are subpleural in location, these are favored to represent subpleural lymph nodes, however, attention on followup studies is recommended to ensure the stability or resolution of these findings. 3. Aortic atherosclerosis, in addition to three-vessel coronary artery disease. Please note that although the presence of coronary artery calcium documents the presence of coronary artery disease, the severity of this disease and any potential stenosis cannot be assessed on this non-gated CT examination. Assessment for potential risk factor modification, dietary therapy or pharmacologic therapy may be warranted, if clinically indicated. 4. Colonic diverticulosis without evidence of acute diverticulitis at this time. 5. Additional incidental findings, as above. Electronically Signed   By: Vinnie Langton M.D.   On: 06/26/2016 11:33   Ct Abdomen Pelvis W Contrast  Result Date: 06/26/2016 CLINICAL DATA:  70 year old male with history of esophageal cancer diagnosed by endoscopy at [facility] 06/18/2016. Initial staging examination. EXAM: CT CHEST, ABDOMEN, AND PELVIS WITH CONTRAST TECHNIQUE: Multidetector CT imaging of the chest, abdomen and pelvis was performed following the standard protocol during bolus administration of intravenous contrast. CONTRAST:  49m ISOVUE-300 IOPAMIDOL (ISOVUE-300) INJECTION 61% COMPARISON:  CT the abdomen and pelvis 03/10/2009. FINDINGS: CT CHEST FINDINGS Cardiovascular: Heart size is normal. There is no significant pericardial fluid, thickening or pericardial calcification. There is aortic atherosclerosis, as well as atherosclerosis of the great vessels of the mediastinum and the coronary arteries, including calcified atherosclerotic plaque in the left anterior descending, ramus intermedius, left circumflex and right coronary arteries. Coronary artery  stents are noted in the left anterior descending and ramus intermedius coronary arteries. Mediastinum/Nodes: Distal esophageal mass at the level of the gastroesophageal junction measures approximately 3.0 x 3.1 x 4.9 cm (axial image 51 of series 2 and sagittal image 114 of series 6). More proximal aspect of the esophagus is otherwise unremarkable. No pathologically enlarged mediastinal or hilar lymph nodes. No axillary lymphadenopathy. Lungs/Pleura: There multiple tiny 2-4 mm pulmonary nodules scattered throughout the lungs bilaterally, nonspecific, but favored to be benign. Most of these are associated with the fissures, and are likely to represent subpleural lymph nodes. No other larger more suspicious appearing pulmonary nodules or masses are noted. There is no acute consolidative airspace disease. No pleural effusions. Musculoskeletal: There are no aggressive appearing lytic or blastic lesions noted in the visualized portions of the skeleton. CT ABDOMEN PELVIS FINDINGS Hepatobiliary: No cystic or solid hepatic lesions. No intra or extrahepatic biliary ductal dilatation. Gallbladder is normal in appearance. Pancreas: No pancreatic mass. No pancreatic ductal dilatation. No pancreatic or peripancreatic fluid or inflammatory changes. Spleen: Unremarkable. Adrenals/Urinary Tract: Sub cm low-attenuation lesion in the upper pole  of the left kidney is too small to definitively characterize, but appears similar in retrospect to prior study 03/10/2009, likely a cyst. Right kidney and bilateral adrenal glands are normal in appearance. There is no hydroureteronephrosis. Urinary bladder is normal in appearance. Stomach/Bowel: The mass in the distal esophagus is at the level of the gastroesophageal junction, and may extend to involve the very proximal aspect of the cardia of the stomach (this is uncertain). More distal aspect of the stomach is otherwise unremarkable in appearance. No pathologic dilatation of small bowel or  colon. Numerous colonic diverticulae are noted, particularly in the sigmoid colon, without surrounding inflammatory changes to suggest an acute diverticulitis at this time. Normal appendix. Vascular/Lymphatic: Aortic atherosclerosis, with ectasia of the distal infrarenal abdominal aorta which measures up to 2.8 cm in diameter shortly before the level of the aortic bifurcation. Prominent but nonenlarged 7 mm short axis celiac axis lymph node is noted, but is nonspecific. No lymphadenopathy noted in the abdomen or pelvis. Reproductive: Prostate gland and seminal vesicles are unremarkable in appearance. Other: Small right inguinal hernia containing only fat incidentally noted. No significant volume of ascites. No pneumoperitoneum. Musculoskeletal: There are no aggressive appearing lytic or blastic lesions noted in the visualized portions of the skeleton. IMPRESSION: 1. Distal esophageal mass measuring approximately 3.0 x 3.1 x 4.9 cm centered at the level of the gastroesophageal junction, with potential extension to involve the most proximal aspect of the cardia. There is no surrounding lymphadenopathy, and no definite evidence of metastatic disease in the chest, abdomen or pelvis. 2. Several tiny 2-4 mm pulmonary nodules scattered throughout the lungs bilaterally are considered highly nonspecific. As most of these are subpleural in location, these are favored to represent subpleural lymph nodes, however, attention on followup studies is recommended to ensure the stability or resolution of these findings. 3. Aortic atherosclerosis, in addition to three-vessel coronary artery disease. Please note that although the presence of coronary artery calcium documents the presence of coronary artery disease, the severity of this disease and any potential stenosis cannot be assessed on this non-gated CT examination. Assessment for potential risk factor modification, dietary therapy or pharmacologic therapy may be warranted, if  clinically indicated. 4. Colonic diverticulosis without evidence of acute diverticulitis at this time. 5. Additional incidental findings, as above. Electronically Signed   By: Vinnie Langton M.D.   On: 06/26/2016 11:33   Nm Pet Image Initial (pi) Skull Base To Thigh  Result Date: 07/03/2016 CLINICAL DATA:  Initial treatment strategy for esophageal carcinoma. EXAM: NUCLEAR MEDICINE PET SKULL BASE TO THIGH TECHNIQUE: 12.1 mCi F-18 FDG was injected intravenously. Full-ring PET imaging was performed from the skull base to thigh after the radiotracer. CT data was obtained and used for attenuation correction and anatomic localization. FASTING BLOOD GLUCOSE:  Value: 100 mg/dl COMPARISON:  CT 06/26/2016 FINDINGS: NECK No hypermetabolic lymph nodes in the neck. CHEST No hypermetabolic mediastinal or hilar nodes. No suspicious pulmonary nodules on the CT scan. ABDOMEN/PELVIS Hypermetabolic thickening the gastroesophageal junction measures 3.5 cm with SUV max equals 7.5. No hypermetabolic gastrohepatic ligament lymph nodes. No hypermetabolic foci within the liver. No hypermetabolic abdominal lymph nodes, periaortic lymph nodes or pelvic lymph nodes. Incidental finding of multiple sigmoid diverticula. Atherosclerotic calcification of the aorta. SKELETON No focal hypermetabolic activity to suggest skeletal metastasis. IMPRESSION: 1. Hypermetabolic thickening at the gastroesophageal junction consists with primary esophageal adenocarcinoma. 2. No evidence of local nodal metastasis within the gastrohepatic ligament or mediastinum. 3. No evidence of liver metastasis or distant  metastasis Electronically Signed   By: Suzy Bouchard M.D.   On: 07/03/2016 15:07   Dg C-arm 1-60 Min-no Report  Result Date: 07/08/2016 CLINICAL DATA: surgery, port placement C-ARM 1-60 MINUTES Fluoroscopy was utilized by the requesting physician.  No radiographic interpretation.    ASSESSMENT: Stage IIb adenocarcinoma of the lower third of  the esophagus.  PLAN:    1. Stage IIb adenocarcinoma of the lower third of the esophagus: Patient had EUS completed at [facility] confirming the stage and diagnosis. PET scan results reviewed independently and reported as above with no obvious evidence of metastatic disease.  Patient will benefit from neoadjuvant chemotherapy and XRT, possibly followed by esophagectomy. He was seen at [facility] yesterday and it was determined that no feeding tube is necessary at this time. Patient has a follow-up appointment in early January for preadmission screening and further evaluation for esophagectomy. Proceed with cycle 3 of weekly carboplatinum and Taxol today. Continue daily XRT which will be completed on August 26, 2016. Port placement has been requested, but this will not delay initiation of treatment. 2. Esophageal obstruction: Patient has progressive difficulty swallowing foods and although he has not lost any weight up to this point, there is concern of maintaining his nutritional status. By report, patient also required a pediatric scope during his EGD secondary to his stenosis. Patient had consultation with dietary. No feeding tube is necessary at this point, but will consider one in the future if his nutritional status declines. 3. Thrombocytopenia: Previously entire workup did not reveal a distinct etiology. No bone marrow biopsy has been performed to this point. Continue to monitor closely. 4. Leukopenia: Patient's white blood cell count is decreased, but approximately his baseline. Continue to monitor. 5. Renal insufficiency: Patient's creatinine is slightly elevated, possibly secondary to mild dehydration. Monitor.   Patient expressed understanding and was in agreement with this plan. He also understands that He can call clinic at any time with any questions, concerns, or complaints.    [name], MD   07/24/2016 10:11 AM

## 2016-07-25 ENCOUNTER — Ambulatory Visit: Payer: Medicare Other

## 2016-07-25 ENCOUNTER — Ambulatory Visit
Admission: RE | Admit: 2016-07-25 | Discharge: 2016-07-25 | Disposition: A | Discharge status: Home / Self Care | Payer: Medicare Other | Source: Ambulatory Visit | Attending: Radiation Oncology | Admitting: Radiation Oncology

## 2016-07-25 DIAGNOSIS — C155 Malignant neoplasm of lower third of esophagus: Secondary | ICD-10-CM | POA: Diagnosis not present

## 2016-07-26 ENCOUNTER — Ambulatory Visit: Payer: Medicare Other

## 2016-07-26 ENCOUNTER — Ambulatory Visit
Admission: RE | Admit: 2016-07-26 | Discharge: 2016-07-26 | Disposition: A | Discharge status: Home / Self Care | Payer: Medicare Other | Source: Ambulatory Visit | Attending: Radiation Oncology | Admitting: Radiation Oncology

## 2016-07-26 DIAGNOSIS — C155 Malignant neoplasm of lower third of esophagus: Secondary | ICD-10-CM | POA: Diagnosis not present

## 2016-07-29 ENCOUNTER — Other Ambulatory Visit: Payer: Self-pay | Admitting: *Deleted

## 2016-07-29 ENCOUNTER — Ambulatory Visit: Payer: Medicare Other

## 2016-07-29 ENCOUNTER — Ambulatory Visit
Admission: RE | Admit: 2016-07-29 | Discharge: 2016-07-29 | Disposition: A | Discharge status: Home / Self Care | Payer: Medicare Other | Source: Ambulatory Visit | Attending: Radiation Oncology | Admitting: Radiation Oncology

## 2016-07-29 DIAGNOSIS — C155 Malignant neoplasm of lower third of esophagus: Secondary | ICD-10-CM | POA: Diagnosis not present

## 2016-07-29 MED ORDER — DEXAMETHASONE 4 MG PO TABS: 4.0000 mg | ORAL_TABLET | Freq: Two times a day (BID) | ORAL | 0 refills | Status: DC

## 2016-07-30 ENCOUNTER — Ambulatory Visit: Payer: Medicare Other

## 2016-07-30 ENCOUNTER — Ambulatory Visit
Admission: RE | Admit: 2016-07-30 | Discharge: 2016-07-30 | Disposition: A | Discharge status: Home / Self Care | Payer: Medicare Other | Source: Ambulatory Visit | Attending: Radiation Oncology | Admitting: Radiation Oncology

## 2016-07-30 DIAGNOSIS — C155 Malignant neoplasm of lower third of esophagus: Secondary | ICD-10-CM | POA: Diagnosis not present

## 2016-07-30 NOTE — Progress Notes (Signed)
[place]  Telephone:(336) [phone] Fax:(336) [phone]  ID: Corey Perry OB: [birth year]  MR#: [id number]  VPX#:[id number]  Patient Care Team: Corey Maple, MD as PCP - General (Family Medicine) Corey Maple, MD as PCP - Family Medicine (Family Medicine) Corey Leys, MD (Specialist) Corey Pyo, MD as Referring Physician (Internal Medicine) Corey Chimera, MD as Consulting Physician (Neurosurgery)  CHIEF COMPLAINT: Stage IIb adenocarcinoma of the lower third of the esophagus.  INTERVAL HISTORY: Patient returns to clinic today for further evaluation and consideration of cycle 4 of carboplatinum and Taxol. He is tolerating his treatments well without significant side effects. He is tolerating his daily XRT as well. He continues to have difficulty swallowing accompanied with regurgitation of food, but states this continues to improve. His weight continues to trend down slowly. He denies any recent fevers or illnesses. He has no neurologic complaints. He denies any chest pain or shortness of breath. He denies any constipation or diarrhea. He has no urinary complaints. Patient offers no further specific complaints today.  REVIEW OF SYSTEMS:   Review of Systems  Constitutional: Positive for weight loss. Negative for fever and malaise/fatigue.  HENT: Negative.   Respiratory: Negative.  Negative for cough and shortness of breath.   Cardiovascular: Negative.  Negative for chest pain and leg swelling.  Gastrointestinal: Positive for vomiting. Negative for abdominal pain, blood in stool, melena and nausea.  Genitourinary: Negative.   Musculoskeletal: Negative.   Neurological: Negative.  Negative for weakness.  Psychiatric/Behavioral: The patient is nervous/anxious.     As per HPI. Otherwise, a complete review of systems is negative.  PAST MEDICAL HISTORY: Past Medical History:  Diagnosis Date  . Abnormal white blood cell (WBC) count    seeing Corey Perry at  [place] on 10/25  . BPH (benign prostatic hypertrophy)   . Bradycardia   . CAD (coronary artery disease)   . Chronic kidney disease   . Dysrhythmia    bradycardia - sees Corey Perry  . Esophageal cancer ([place]) 05/2016  . GERD (gastroesophageal reflux disease)   . Hyperlipidemia   . Hypertension   . Hypothyroidism   . Sleep apnea    mild - SS - Care everywhere -04/18/07  . Spinal stenosis    lumbar    PAST SURGICAL HISTORY: Past Surgical History:  Procedure Laterality Date  . BACK SURGERY    . CARDIAC CATHETERIZATION Left 03/12/2016   Procedure: Left Heart Cath and Coronary Angiography;  Surgeon: [name], MD;  Location: [place] [facility];  Service: Cardiovascular;  Laterality: Left;  . CARDIAC CATHETERIZATION N/A 03/12/2016   Procedure: Coronary Stent Intervention;  Surgeon: Yolonda Kida, MD;  Location: [place] [facility];  Service: Cardiovascular;  Laterality: N/A;  . COLONOSCOPY WITH PROPOFOL N/A 07/13/2015   Procedure: COLONOSCOPY WITH PROPOFOL;  Surgeon: Lucilla Lame, MD;  Location: [place];  Service: Endoscopy;  Laterality: N/A;  . CORONARY ANGIOPLASTY  10/05 and 12/05   4 DES placed  . ESOPHAGOGASTRODUODENOSCOPY (EGD) WITH PROPOFOL N/A 06/14/2016   Procedure: ESOPHAGOGASTRODUODENOSCOPY (EGD) WITH PROPOFOL;  Surgeon: Corey Sails, MD;  Location[facility];  Service: Endoscopy;  Laterality: N/A;  . HERNIA REPAIR     x3  . POLYPECTOMY  07/13/2015   Procedure: POLYPECTOMY;  Surgeon: Lucilla Lame, MD;  Location: [place];  Service: Endoscopy;;  . PORTACATH PLACEMENT N/A 07/08/2016   Procedure: INSERTION PORT-A-CATH;  Surgeon: Corey Ree, MD;  Location: [facility] ORS;  Service: General;  Laterality:  N/A;    FAMILY HISTORY Family History  Problem Relation Age of Onset  . Hypertension Mother   . Heart disease Father   . Hypertension Father   . Kidney disease Father   . Hypertension Brother   . Diabetes Son        ADVANCED  DIRECTIVES:    HEALTH MAINTENANCE: Social History  Substance Use Topics  . Smoking status: Former Smoker    Packs/day: 1.00    Years: 40.00    Types: Cigarettes    Quit date: 07/16/2004  . Smokeless tobacco: Former Systems developer    Types: Chew  . Alcohol use No     Colonoscopy:  PAP:  Bone density:  Lipid panel:  No Known Allergies  Current Outpatient Prescriptions  Medication Sig Dispense Refill  . aspirin 81 MG tablet Take 81 mg by mouth daily. AM    . dexamethasone (DECADRON) 4 MG tablet Take 1 tablet (4 mg total) by mouth 2 (two) times daily with a meal. 30 tablet 0  . docusate (COLACE) 60 MG/15ML syrup Take 15 mLs (60 mg total) by mouth daily. 200 mL 0  . isosorbide mononitrate (IMDUR) 30 MG 24 hr tablet 30 mg daily. AM  5  . levothyroxine (SYNTHROID, LEVOTHROID) 50 MCG tablet Take 1 tablet (50 mcg total) by mouth daily. 90 tablet 4  . lisinopril (PRINIVIL,ZESTRIL) 10 MG tablet Take 1 tablet (10 mg total) by mouth daily. 90 tablet 4  . multivitamin-iron-minerals-folic acid (CENTRUM) chewable tablet Chew 1 tablet by mouth daily.    . nitroGLYCERIN (NITROSTAT) 0.4 MG SL tablet Place 1 tablet under the tongue every 5 (five) minutes as needed.  98  . ondansetron (ZOFRAN) 8 MG tablet Take 1 tablet (8 mg total) by mouth 2 (two) times daily as needed for refractory nausea / vomiting. 30 tablet 1  . pantoprazole (PROTONIX) 20 MG tablet Take 1 tablet (20 mg total) by mouth daily. 90 tablet 4  . prochlorperazine (COMPAZINE) 10 MG tablet Take 1 tablet (10 mg total) by mouth every 6 (six) hours as needed for nausea or vomiting. 30 tablet 0  . simvastatin (ZOCOR) 40 MG tablet Take 1 tablet (40 mg total) by mouth daily. 90 tablet 4  . sucralfate (CARAFATE) 1 g tablet Take 1 tablet (1 g total) by mouth 3 (three) times daily. Dissolve in 2-3 tbsp of warm water; swish and swallow 90 tablet 3  . ticagrelor (BRILINTA) 90 MG TABS tablet Take 1 tablet by mouth daily.     No current  facility-administered medications for this visit.    Facility-Administered Medications Ordered in Other Visits  Medication Dose Route Frequency Provider Last Rate Last Dose  . sodium chloride flush (NS) 0.9 % injection 10 mL  10 mL Intracatheter PRN [name], MD   10 mL at 07/31/16 1044    OBJECTIVE: Vitals:   07/31/16 0949  BP: 108/62  Pulse: 62  Resp: 18  Temp: 97.4 F (36.3 C)     Body mass index is 29.72 kg/m.    ECOG FS:0 - Asymptomatic  General: Well-developed, well-nourished, no acute distress. Eyes: Pink conjunctiva, anicteric sclera. Lungs: Clear to auscultation bilaterally. Heart: Regular rate and rhythm. No rubs, murmurs, or gallops. Abdomen: Soft, nontender, nondistended. No organomegaly noted, normoactive bowel sounds. Musculoskeletal: No edema, cyanosis, or clubbing. Neuro: Alert, answering all questions appropriately. Cranial nerves grossly intact. Skin: No rashes or petechiae noted. Psych: Normal affect.   LAB RESULTS:  Lab Results  Component Value Date  NA 136 07/31/2016   K 4.2 07/31/2016   CL 105 07/31/2016   CO2 25 07/31/2016   GLUCOSE 115 (H) 07/31/2016   BUN 31 (H) 07/31/2016   CREATININE 1.33 (H) 07/31/2016   CALCIUM 9.1 07/31/2016   PROT 7.0 07/31/2016   ALBUMIN 4.2 07/31/2016   AST 24 07/31/2016   ALT 21 07/31/2016   ALKPHOS 37 (L) 07/31/2016   BILITOT 0.7 07/31/2016   GFRNONAA 53 (L) 07/31/2016   GFRAA >60 07/31/2016    Lab Results  Component Value Date   WBC 4.2 07/31/2016   NEUTROABS 3.7 07/31/2016   HGB 12.1 (L) 07/31/2016   HCT 35.0 (L) 07/31/2016   MCV 100.1 (H) 07/31/2016   PLT 75 (L) 07/31/2016     STUDIES: Dg Chest 1 View  Result Date: 07/08/2016 CLINICAL DATA:  Port-A-Cath placement. EXAM: CHEST 1 VIEW COMPARISON:  None. FINDINGS: The left subclavian Port-A-Cath tip is 7 cm above the carina and likely just into the right atrium. No pneumothorax or hematoma is identified. The lungs are clear. No pleural  effusion. IMPRESSION: Left subclavian Port-A-Cath tip is likely just below the cavoatrial junction and in the right atrium. Electronically Signed   By: Marijo Sanes M.D.   On: 07/08/2016 12:29   Nm Pet Image Initial (pi) Skull Base To Thigh  Result Date: 07/03/2016 CLINICAL DATA:  Initial treatment strategy for esophageal carcinoma. EXAM: NUCLEAR MEDICINE PET SKULL BASE TO THIGH TECHNIQUE: 12.1 mCi F-18 FDG was injected intravenously. Full-ring PET imaging was performed from the skull base to thigh after the radiotracer. CT data was obtained and used for attenuation correction and anatomic localization. FASTING BLOOD GLUCOSE:  Value: 100 mg/dl COMPARISON:  CT 06/26/2016 FINDINGS: NECK No hypermetabolic lymph nodes in the neck. CHEST No hypermetabolic mediastinal or hilar nodes. No suspicious pulmonary nodules on the CT scan. ABDOMEN/PELVIS Hypermetabolic thickening the gastroesophageal junction measures 3.5 cm with SUV max equals 7.5. No hypermetabolic gastrohepatic ligament lymph nodes. No hypermetabolic foci within the liver. No hypermetabolic abdominal lymph nodes, periaortic lymph nodes or pelvic lymph nodes. Incidental finding of multiple sigmoid diverticula. Atherosclerotic calcification of the aorta. SKELETON No focal hypermetabolic activity to suggest skeletal metastasis. IMPRESSION: 1. Hypermetabolic thickening at the gastroesophageal junction consists with primary esophageal adenocarcinoma. 2. No evidence of local nodal metastasis within the gastrohepatic ligament or mediastinum. 3. No evidence of liver metastasis or distant metastasis Electronically Signed   By: Suzy Bouchard M.D.   On: 07/03/2016 15:07   Dg C-arm 1-60 Min-no Report  Result Date: 07/08/2016 CLINICAL DATA: surgery, port placement C-ARM 1-60 MINUTES Fluoroscopy was utilized by the requesting physician.  No radiographic interpretation.    ASSESSMENT: Stage IIb adenocarcinoma of the lower third of the esophagus.  PLAN:     1. Stage IIb adenocarcinoma of the lower third of the esophagus: Patient had EUS completed at [facility] confirming the stage and diagnosis. PET scan results reviewed independently and reported as above with no obvious evidence of metastatic disease.  Patient will benefit from neoadjuvant chemotherapy and XRT, possibly followed by esophagectomy. He was seen at [facility] yesterday and it was determined that no feeding tube is necessary at this time. Patient has a follow-up appointment in early January for preadmission screening and further evaluation for esophagectomy. Proceed with cycle 4 of weekly carboplatinum and Taxol today despite thrombocytopenia. Continue daily XRT which will be completed on August 26, 2016. Return to clinic in 2 weeks for consideration of cycle 5.  2. Esophageal obstruction: There  is concern of maintaining his nutritional status. By report, patient also required a pediatric scope during his EGD secondary to his stenosis. Patient had consultation with dietary. No feeding tube is necessary at this point, but will consider one in the future if his nutritional status declines. 3. Thrombocytopenia: Previously entire workup did not reveal a distinct etiology. No bone marrow biopsy has been performed to this point. Continue to monitor closely. 4. Leukopenia: Patient's white blood cell count is now within normal limits. Monitor.  5. Renal insufficiency: Patient's creatinine is slightly elevated, possibly secondary to mild dehydration. Monitor.   Patient expressed understanding and was in agreement with this plan. He also understands that He can call clinic at any time with any questions, concerns, or complaints.    [name], MD   07/31/2016 3:00 PM

## 2016-07-31 ENCOUNTER — Inpatient Hospital Stay (HOSPITAL_BASED_OUTPATIENT_CLINIC_OR_DEPARTMENT_OTHER): Payer: Medicare Other | Admitting: Oncology

## 2016-07-31 ENCOUNTER — Ambulatory Visit: Payer: Medicare Other

## 2016-07-31 ENCOUNTER — Inpatient Hospital Stay: Payer: Medicare Other

## 2016-07-31 ENCOUNTER — Ambulatory Visit
Admission: RE | Admit: 2016-07-31 | Discharge: 2016-07-31 | Disposition: A | Discharge status: Home / Self Care | Payer: Medicare Other | Source: Ambulatory Visit | Attending: Radiation Oncology | Admitting: Radiation Oncology

## 2016-07-31 VITALS — BP 131/73 | HR 42 | Temp 95.8°F | Resp 18

## 2016-07-31 VITALS — BP 108/62 | HR 62 | Temp 97.4°F | Resp 18 | Wt 201.3 lb

## 2016-07-31 DIAGNOSIS — R918 Other nonspecific abnormal finding of lung field: Secondary | ICD-10-CM

## 2016-07-31 DIAGNOSIS — K4090 Unilateral inguinal hernia, without obstruction or gangrene, not specified as recurrent: Secondary | ICD-10-CM

## 2016-07-31 DIAGNOSIS — D72819 Decreased white blood cell count, unspecified: Secondary | ICD-10-CM

## 2016-07-31 DIAGNOSIS — R001 Bradycardia, unspecified: Secondary | ICD-10-CM

## 2016-07-31 DIAGNOSIS — G4730 Sleep apnea, unspecified: Secondary | ICD-10-CM

## 2016-07-31 DIAGNOSIS — N189 Chronic kidney disease, unspecified: Secondary | ICD-10-CM

## 2016-07-31 DIAGNOSIS — E785 Hyperlipidemia, unspecified: Secondary | ICD-10-CM

## 2016-07-31 DIAGNOSIS — D696 Thrombocytopenia, unspecified: Secondary | ICD-10-CM

## 2016-07-31 DIAGNOSIS — R1310 Dysphagia, unspecified: Secondary | ICD-10-CM

## 2016-07-31 DIAGNOSIS — K921 Melena: Secondary | ICD-10-CM

## 2016-07-31 DIAGNOSIS — K222 Esophageal obstruction: Secondary | ICD-10-CM | POA: Diagnosis not present

## 2016-07-31 DIAGNOSIS — N400 Enlarged prostate without lower urinary tract symptoms: Secondary | ICD-10-CM

## 2016-07-31 DIAGNOSIS — C155 Malignant neoplasm of lower third of esophagus: Secondary | ICD-10-CM

## 2016-07-31 DIAGNOSIS — R112 Nausea with vomiting, unspecified: Secondary | ICD-10-CM

## 2016-07-31 DIAGNOSIS — M48061 Spinal stenosis, lumbar region without neurogenic claudication: Secondary | ICD-10-CM

## 2016-07-31 DIAGNOSIS — Z7982 Long term (current) use of aspirin: Secondary | ICD-10-CM

## 2016-07-31 DIAGNOSIS — E039 Hypothyroidism, unspecified: Secondary | ICD-10-CM

## 2016-07-31 DIAGNOSIS — K219 Gastro-esophageal reflux disease without esophagitis: Secondary | ICD-10-CM

## 2016-07-31 DIAGNOSIS — F419 Anxiety disorder, unspecified: Secondary | ICD-10-CM

## 2016-07-31 DIAGNOSIS — I2510 Atherosclerotic heart disease of native coronary artery without angina pectoris: Secondary | ICD-10-CM

## 2016-07-31 DIAGNOSIS — K5730 Diverticulosis of large intestine without perforation or abscess without bleeding: Secondary | ICD-10-CM

## 2016-07-31 DIAGNOSIS — Z923 Personal history of irradiation: Secondary | ICD-10-CM

## 2016-07-31 DIAGNOSIS — Z87891 Personal history of nicotine dependence: Secondary | ICD-10-CM

## 2016-07-31 DIAGNOSIS — I129 Hypertensive chronic kidney disease with stage 1 through stage 4 chronic kidney disease, or unspecified chronic kidney disease: Secondary | ICD-10-CM

## 2016-07-31 DIAGNOSIS — I700 Atherosclerosis of aorta: Secondary | ICD-10-CM

## 2016-07-31 LAB — COMPREHENSIVE METABOLIC PANEL
ALBUMIN: 4.2 g/dL (ref 3.5–5.0)
ALT: 21 U/L (ref 17–63)
AST: 24 U/L (ref 15–41)
Alkaline Phosphatase: 37 U/L — ABNORMAL LOW (ref 38–126)
Anion gap: 6 (ref 5–15)
BUN: 31 mg/dL — ABNORMAL HIGH (ref 6–20)
CHLORIDE: 105 mmol/L (ref 101–111)
CO2: 25 mmol/L (ref 22–32)
CREATININE: 1.33 mg/dL — AB (ref 0.61–1.24)
Calcium: 9.1 mg/dL (ref 8.9–10.3)
GFR calc non Af Amer: 53 mL/min — ABNORMAL LOW (ref >60)
GLUCOSE: 115 mg/dL — AB (ref 65–99)
Potassium: 4.2 mmol/L (ref 3.5–5.1)
SODIUM: 136 mmol/L (ref 135–145)
Total Bilirubin: 0.7 mg/dL (ref 0.3–1.2)
Total Protein: 7 g/dL (ref 6.5–8.1)

## 2016-07-31 LAB — CBC WITH DIFFERENTIAL/PLATELET
Basophils Absolute: 0 10*3/uL (ref 0–0.1)
Basophils Relative: 0 %
EOS ABS: 0 10*3/uL (ref 0–0.7)
EOS PCT: 0 %
HCT: 35 % — ABNORMAL LOW (ref 40.0–52.0)
Hemoglobin: 12.1 g/dL — ABNORMAL LOW (ref 13.0–18.0)
LYMPHS ABS: 0.2 10*3/uL — AB (ref 1.0–3.6)
LYMPHS PCT: 4 %
MCH: 34.6 pg — AB (ref 26.0–34.0)
MCHC: 34.5 g/dL (ref 32.0–36.0)
MCV: 100.1 fL — AB (ref 80.0–100.0)
MONO ABS: 0.3 10*3/uL (ref 0.2–1.0)
Monocytes Relative: 8 %
Neutro Abs: 3.7 10*3/uL (ref 1.4–6.5)
Neutrophils Relative %: 88 %
PLATELETS: 75 10*3/uL — AB (ref 150–440)
RBC: 3.5 MIL/uL — AB (ref 4.40–5.90)
RDW: 14.2 % (ref 11.5–14.5)
WBC: 4.2 10*3/uL (ref 3.8–10.6)

## 2016-07-31 MED ORDER — SODIUM CHLORIDE 0.9% FLUSH
10.0000 mL | INTRAVENOUS | Status: DC | PRN
  Administered 2016-07-31: 10 mL
  Filled 2016-07-31: qty 10

## 2016-07-31 MED ORDER — PACLITAXEL CHEMO INJECTION 300 MG/50ML
45.0000 mg/m2 | Freq: Once | INTRAVENOUS | Status: AC
  Administered 2016-07-31: 96 mg via INTRAVENOUS
  Filled 2016-07-31: qty 16

## 2016-07-31 MED ORDER — HEPARIN SOD (PORK) LOCK FLUSH 100 UNIT/ML IV SOLN
500.0000 [IU] | Freq: Once | INTRAVENOUS | Status: AC | PRN
  Administered 2016-07-31: 500 [IU]
  Filled 2016-07-31: qty 5

## 2016-07-31 MED ORDER — FAMOTIDINE IN NACL 20-0.9 MG/50ML-% IV SOLN
20.0000 mg | Freq: Once | INTRAVENOUS | Status: AC
  Administered 2016-07-31: 20 mg via INTRAVENOUS
  Filled 2016-07-31: qty 50

## 2016-07-31 MED ORDER — DIPHENHYDRAMINE HCL 50 MG/ML IJ SOLN
25.0000 mg | Freq: Once | INTRAMUSCULAR | Status: AC
  Administered 2016-07-31: 25 mg via INTRAVENOUS
  Filled 2016-07-31: qty 1

## 2016-07-31 MED ORDER — CARBOPLATIN CHEMO INJECTION 450 MG/45ML
180.0000 mg | Freq: Once | INTRAVENOUS | Status: AC
  Administered 2016-07-31: 180 mg via INTRAVENOUS
  Filled 2016-07-31: qty 18

## 2016-07-31 MED ORDER — DEXAMETHASONE SODIUM PHOSPHATE 10 MG/ML IJ SOLN
10.0000 mg | Freq: Once | INTRAMUSCULAR | Status: AC
  Administered 2016-07-31: 10 mg via INTRAVENOUS
  Filled 2016-07-31: qty 1

## 2016-07-31 MED ORDER — PALONOSETRON HCL INJECTION 0.25 MG/5ML
0.2500 mg | Freq: Once | INTRAVENOUS | Status: AC
  Administered 2016-07-31: 0.25 mg via INTRAVENOUS
  Filled 2016-07-31: qty 5

## 2016-07-31 MED ORDER — SODIUM CHLORIDE 0.9 % IV SOLN
10.0000 mg | Freq: Once | INTRAVENOUS | Status: DC
Start: 2016-07-31 — End: 2016-07-31

## 2016-07-31 MED ORDER — SODIUM CHLORIDE 0.9 % IV SOLN
Freq: Once | INTRAVENOUS | Status: AC
  Administered 2016-07-31: 11:00:00 via INTRAVENOUS
  Filled 2016-07-31: qty 1000

## 2016-07-31 NOTE — Progress Notes (Signed)
Patient is having right side jaw pain that radiates down the chest to epigastric for the past week.  Also having daily nosebleeds.  Dr. Baruch Gouty prescribed Carafate and Decadron to help with the dysphagia and they have helped with increase in appetite.

## 2016-08-01 ENCOUNTER — Ambulatory Visit
Admission: RE | Admit: 2016-08-01 | Discharge: 2016-08-01 | Disposition: A | Discharge status: Home / Self Care | Payer: Medicare Other | Source: Ambulatory Visit | Attending: Radiation Oncology | Admitting: Radiation Oncology

## 2016-08-01 ENCOUNTER — Other Ambulatory Visit: Payer: Self-pay | Admitting: *Deleted

## 2016-08-01 ENCOUNTER — Ambulatory Visit: Payer: Medicare Other

## 2016-08-01 DIAGNOSIS — C155 Malignant neoplasm of lower third of esophagus: Secondary | ICD-10-CM | POA: Diagnosis not present

## 2016-08-01 MED ORDER — OXYCODONE-ACETAMINOPHEN 5-325 MG PO TABS: 1.0000 | ORAL_TABLET | Freq: Four times a day (QID) | ORAL | 0 refills | Status: DC | PRN

## 2016-08-02 ENCOUNTER — Ambulatory Visit: Payer: Medicare Other

## 2016-08-02 ENCOUNTER — Ambulatory Visit
Admission: RE | Admit: 2016-08-02 | Discharge: 2016-08-02 | Disposition: A | Discharge status: Home / Self Care | Payer: Medicare Other | Source: Ambulatory Visit | Attending: Radiation Oncology | Admitting: Radiation Oncology

## 2016-08-02 DIAGNOSIS — C155 Malignant neoplasm of lower third of esophagus: Secondary | ICD-10-CM | POA: Diagnosis not present

## 2016-08-05 ENCOUNTER — Ambulatory Visit: Payer: Medicare Other

## 2016-08-05 ENCOUNTER — Ambulatory Visit
Admission: RE | Admit: 2016-08-05 | Discharge: 2016-08-05 | Disposition: A | Discharge status: Home / Self Care | Payer: Medicare Other | Source: Ambulatory Visit | Attending: Radiation Oncology | Admitting: Radiation Oncology

## 2016-08-05 DIAGNOSIS — C155 Malignant neoplasm of lower third of esophagus: Secondary | ICD-10-CM | POA: Diagnosis not present

## 2016-08-06 ENCOUNTER — Ambulatory Visit: Payer: Medicare Other

## 2016-08-06 ENCOUNTER — Ambulatory Visit
Admission: RE | Admit: 2016-08-06 | Discharge: 2016-08-06 | Disposition: A | Discharge status: Home / Self Care | Payer: Medicare Other | Source: Ambulatory Visit | Attending: Radiation Oncology | Admitting: Radiation Oncology

## 2016-08-06 DIAGNOSIS — C155 Malignant neoplasm of lower third of esophagus: Secondary | ICD-10-CM | POA: Diagnosis not present

## 2016-08-07 ENCOUNTER — Ambulatory Visit: Payer: Medicare Other

## 2016-08-12 ENCOUNTER — Ambulatory Visit
Admission: RE | Admit: 2016-08-12 | Discharge: 2016-08-12 | Disposition: A | Discharge status: Home / Self Care | Payer: Medicare Other | Source: Ambulatory Visit | Attending: Radiation Oncology | Admitting: Radiation Oncology

## 2016-08-12 ENCOUNTER — Ambulatory Visit: Payer: Medicare Other

## 2016-08-12 DIAGNOSIS — C155 Malignant neoplasm of lower third of esophagus: Secondary | ICD-10-CM | POA: Diagnosis not present

## 2016-08-13 ENCOUNTER — Ambulatory Visit: Payer: Medicare Other

## 2016-08-13 ENCOUNTER — Ambulatory Visit
Admission: RE | Admit: 2016-08-13 | Discharge: 2016-08-13 | Disposition: A | Discharge status: Home / Self Care | Payer: Medicare Other | Source: Ambulatory Visit | Attending: Radiation Oncology | Admitting: Radiation Oncology

## 2016-08-13 DIAGNOSIS — C155 Malignant neoplasm of lower third of esophagus: Secondary | ICD-10-CM | POA: Diagnosis not present

## 2016-08-13 NOTE — Progress Notes (Signed)
[place]  Telephone:(336) [phone] Fax:(336) [phone]  ID: Corey Perry OB: [birth year]  MR#: [id number]  MWN#:[id number]  Patient Care Team: [name], MD as PCP - General (Family Medicine) [name], MD as PCP - Family Medicine (Family Medicine) [name], MD (Specialist) [name], MD as Referring Physician (Internal Medicine) [name], MD as Consulting Physician (Neurosurgery)  CHIEF COMPLAINT: Stage IIb adenocarcinoma of the lower third of the esophagus.  INTERVAL HISTORY: Patient returns to clinic today for further evaluation and consideration of cycle 5 of carboplatinum and Taxol. He is tolerating his treatments well without significant side effects. He is tolerating his daily XRT as well. He continues to have difficulty swallowing accompanied with regurgitation of food, but states this continues to improve. His weight has stabilized slightly. He denies any recent fevers or illnesses. He has no neurologic complaints. He denies any chest pain or shortness of breath. He denies any constipation or diarrhea. He has no urinary complaints. Patient offers no further specific complaints today.  REVIEW OF SYSTEMS:   Review of Systems  Constitutional: Positive for weight loss. Negative for fever and malaise/fatigue.  HENT: Negative.   Respiratory: Negative.  Negative for cough and shortness of breath.   Cardiovascular: Negative.  Negative for chest pain and leg swelling.  Gastrointestinal: Positive for vomiting. Negative for abdominal pain, blood in stool, melena and nausea.  Genitourinary: Negative.   Musculoskeletal: Negative.   Neurological: Negative.  Negative for weakness.  Psychiatric/Behavioral: The patient is nervous/anxious.     As per HPI. Otherwise, a complete review of systems is negative.  PAST MEDICAL HISTORY: Past Medical History:  Diagnosis Date  . Abnormal white blood cell (WBC) count    seeing Dr. [name] at [place]  on 10/25  . BPH (benign prostatic hypertrophy)   . Bradycardia   . CAD (coronary artery disease)   . Chronic kidney disease   . Dysrhythmia    bradycardia - sees Dr. Humphrey Rolls  . Esophageal cancer ([place]) 05/2016  . GERD (gastroesophageal reflux disease)   . Hyperlipidemia   . Hypertension   . Hypothyroidism   . Sleep apnea    mild - SS - Care everywhere -04/18/07  . Spinal stenosis    lumbar    PAST SURGICAL HISTORY: Past Surgical History:  Procedure Laterality Date  . BACK SURGERY    . CARDIAC CATHETERIZATION Left 03/12/2016   Procedure: Left Heart Cath and Coronary Angiography;  Surgeon: [name], MD;  Location: [place] [facility];  Service: Cardiovascular;  Laterality: Left;  . CARDIAC CATHETERIZATION N/A 03/12/2016   Procedure: Coronary Stent Intervention;  Surgeon: Yolonda Kida, MD;  Location: [place] [facility];  Service: Cardiovascular;  Laterality: N/A;  . COLONOSCOPY WITH PROPOFOL N/A 07/13/2015   Procedure: COLONOSCOPY WITH PROPOFOL;  Surgeon: Lucilla Lame, MD;  Location: [place];  Service: Endoscopy;  Laterality: N/A;  . CORONARY ANGIOPLASTY  10/05 and 12/05   4 DES placed  . ESOPHAGOGASTRODUODENOSCOPY (EGD) WITH PROPOFOL N/A 06/14/2016   Procedure: ESOPHAGOGASTRODUODENOSCOPY (EGD) WITH PROPOFOL;  Surgeon: [name], MD;  Location: [facility] ENDOSCOPY;  Service: Endoscopy;  Laterality: N/A;  . HERNIA REPAIR     x3  . POLYPECTOMY  07/13/2015   Procedure: POLYPECTOMY;  Surgeon: Lucilla Lame, MD;  Location: [place];  Service: Endoscopy;;  . PORTACATH PLACEMENT N/A 07/08/2016   Procedure: INSERTION PORT-A-CATH;  Surgeon: Olean Ree, MD;  Location: [facility] ORS;  Service: General;  Laterality: N/A;  FAMILY HISTORY Family History  Problem Relation Age of Onset  . Hypertension Mother   . Heart disease Father   . Hypertension Father   . Kidney disease Father   . Hypertension Brother   . Diabetes Son        ADVANCED DIRECTIVES:     HEALTH MAINTENANCE: Social History  Substance Use Topics  . Smoking status: Former Smoker    Packs/day: 1.00    Years: 40.00    Types: Cigarettes    Quit date: 07/16/2004  . Smokeless tobacco: Former Systems developer    Types: Chew  . Alcohol use No     Colonoscopy:  PAP:  Bone density:  Lipid panel:  No Known Allergies  Current Outpatient Prescriptions  Medication Sig Dispense Refill  . aspirin 81 MG tablet Take 81 mg by mouth daily. AM    . dexamethasone (DECADRON) 4 MG tablet Take 1 tablet (4 mg total) by mouth 2 (two) times daily with a meal. 30 tablet 0  . docusate (COLACE) 60 MG/15ML syrup Take 15 mLs (60 mg total) by mouth daily. 200 mL 0  . isosorbide mononitrate (IMDUR) 30 MG 24 hr tablet 30 mg daily. AM  5  . levothyroxine (SYNTHROID, LEVOTHROID) 50 MCG tablet Take 1 tablet (50 mcg total) by mouth daily. 90 tablet 4  . lisinopril (PRINIVIL,ZESTRIL) 10 MG tablet Take 1 tablet (10 mg total) by mouth daily. 90 tablet 4  . multivitamin-iron-minerals-folic acid (CENTRUM) chewable tablet Chew 1 tablet by mouth daily.    . nitroGLYCERIN (NITROSTAT) 0.4 MG SL tablet Place 1 tablet under the tongue every 5 (five) minutes as needed.  98  . ondansetron (ZOFRAN) 8 MG tablet Take 1 tablet (8 mg total) by mouth 2 (two) times daily as needed for refractory nausea / vomiting. 30 tablet 1  . pantoprazole (PROTONIX) 20 MG tablet Take 1 tablet (20 mg total) by mouth daily. 90 tablet 4  . prochlorperazine (COMPAZINE) 10 MG tablet Take 1 tablet (10 mg total) by mouth every 6 (six) hours as needed for nausea or vomiting. 30 tablet 0  . simvastatin (ZOCOR) 40 MG tablet Take 1 tablet (40 mg total) by mouth daily. 90 tablet 4  . sucralfate (CARAFATE) 1 g tablet Take 1 tablet (1 g total) by mouth 3 (three) times daily. Dissolve in 2-3 tbsp of warm water; swish and swallow 90 tablet 3  . ticagrelor (BRILINTA) 90 MG TABS tablet Take 1 tablet by mouth daily.    Marland Kitchen oxyCODONE-acetaminophen (PERCOCET/ROXICET)  5-325 MG tablet Take 1 tablet by mouth every 6 (six) hours as needed for severe pain. 60 tablet 0   No current facility-administered medications for this visit.    Facility-Administered Medications Ordered in Other Visits  Medication Dose Route Frequency Provider Last Rate Last Dose  . sodium chloride flush (NS) 0.9 % injection 10 mL  10 mL Intracatheter PRN [name], MD   10 mL at 08/14/16 0930    OBJECTIVE: Vitals:   08/14/16 0938  BP: 110/75  Pulse: 64  Resp: 18  Temp: (!) 96.6 F (35.9 C)     Body mass index is 28.57 kg/m.    ECOG FS:0 - Asymptomatic  General: Well-developed, well-nourished, no acute distress. Eyes: Pink conjunctiva, anicteric sclera. Lungs: Clear to auscultation bilaterally. Heart: Regular rate and rhythm. No rubs, murmurs, or gallops. Abdomen: Soft, nontender, nondistended. No organomegaly noted, normoactive bowel sounds. Musculoskeletal: No edema, cyanosis, or clubbing. Neuro: Alert, answering all questions appropriately. Cranial nerves grossly intact.  Skin: No rashes or petechiae noted. Psych: Normal affect.   LAB RESULTS:  Lab Results  Component Value Date   NA 131 (L) 08/14/2016   K 4.8 08/14/2016   CL 100 (L) 08/14/2016   CO2 24 08/14/2016   GLUCOSE 119 (H) 08/14/2016   BUN 34 (H) 08/14/2016   CREATININE 1.50 (H) 08/14/2016   CALCIUM 9.5 08/14/2016   PROT 7.3 08/14/2016   ALBUMIN 4.4 08/14/2016   AST 22 08/14/2016   ALT 23 08/14/2016   ALKPHOS 40 08/14/2016   BILITOT 0.7 08/14/2016   GFRNONAA 46 (L) 08/14/2016   GFRAA 53 (L) 08/14/2016    Lab Results  Component Value Date   WBC 1.9 (L) 08/14/2016   NEUTROABS 1.5 08/14/2016   HGB 11.8 (L) 08/14/2016   HCT 33.3 (L) 08/14/2016   MCV 99.7 08/14/2016   PLT 112 (L) 08/14/2016     STUDIES: No results found.  ASSESSMENT: Stage IIb adenocarcinoma of the lower third of the esophagus.  PLAN:    1. Stage IIb adenocarcinoma of the lower third of the esophagus: Patient had  EUS completed at [facility] confirming the stage and diagnosis. PET scan results reviewed independently and reported as above with no obvious evidence of metastatic disease.  Patient will benefit from neoadjuvant chemotherapy and XRT, possibly followed by esophagectomy. He was seen at [facility] yesterday and it was determined that no feeding tube is necessary at this time. Patient has a follow-up appointment in early January for preadmission screening and further evaluation for esophagectomy. Proceed with cycle 5 of weekly carboplatinum and Taxol today. Continue daily XRT which will be completed on August 27, 2016. Return to clinic in 1 week for consideration of cycle 6.  2. Esophageal obstruction: There is concern of maintaining his nutritional status. By report, patient also required a pediatric scope during his EGD secondary to his stenosis. Patient had consultation with dietary. No feeding tube is necessary at this point, but will consider one in the future if his nutritional status declines. 3. Thrombocytopenia: Previously entire workup did not reveal a distinct etiology. No bone marrow biopsy has been performed to this point. Continue to monitor closely. 4. Leukopenia: Decreased, proceed with treatment as above. Monitor.  5. Renal insufficiency: Patient's creatinine is slightly elevated, possibly secondary to mild dehydration. Monitor.   Patient expressed understanding and was in agreement with this plan. He also understands that He can call clinic at any time with any questions, concerns, or complaints.    [name], MD   08/18/2016 8:09 AM

## 2016-08-14 ENCOUNTER — Ambulatory Visit
Admission: RE | Admit: 2016-08-14 | Discharge: 2016-08-14 | Disposition: A | Discharge status: Home / Self Care | Payer: Medicare Other | Source: Ambulatory Visit | Attending: Radiation Oncology | Admitting: Radiation Oncology

## 2016-08-14 ENCOUNTER — Ambulatory Visit: Payer: Medicare Other

## 2016-08-14 ENCOUNTER — Inpatient Hospital Stay (HOSPITAL_BASED_OUTPATIENT_CLINIC_OR_DEPARTMENT_OTHER): Payer: Medicare Other | Admitting: Oncology

## 2016-08-14 ENCOUNTER — Inpatient Hospital Stay: Payer: Medicare Other

## 2016-08-14 VITALS — BP 128/71 | HR 65

## 2016-08-14 VITALS — BP 110/75 | HR 64 | Temp 96.6°F | Resp 18 | Wt 193.5 lb

## 2016-08-14 DIAGNOSIS — I700 Atherosclerosis of aorta: Secondary | ICD-10-CM

## 2016-08-14 DIAGNOSIS — E785 Hyperlipidemia, unspecified: Secondary | ICD-10-CM

## 2016-08-14 DIAGNOSIS — K222 Esophageal obstruction: Secondary | ICD-10-CM

## 2016-08-14 DIAGNOSIS — N189 Chronic kidney disease, unspecified: Secondary | ICD-10-CM

## 2016-08-14 DIAGNOSIS — N400 Enlarged prostate without lower urinary tract symptoms: Secondary | ICD-10-CM

## 2016-08-14 DIAGNOSIS — C155 Malignant neoplasm of lower third of esophagus: Secondary | ICD-10-CM

## 2016-08-14 DIAGNOSIS — K219 Gastro-esophageal reflux disease without esophagitis: Secondary | ICD-10-CM

## 2016-08-14 DIAGNOSIS — R1310 Dysphagia, unspecified: Secondary | ICD-10-CM | POA: Diagnosis not present

## 2016-08-14 DIAGNOSIS — K921 Melena: Secondary | ICD-10-CM

## 2016-08-14 DIAGNOSIS — I2510 Atherosclerotic heart disease of native coronary artery without angina pectoris: Secondary | ICD-10-CM

## 2016-08-14 DIAGNOSIS — Z7982 Long term (current) use of aspirin: Secondary | ICD-10-CM

## 2016-08-14 DIAGNOSIS — G4730 Sleep apnea, unspecified: Secondary | ICD-10-CM

## 2016-08-14 DIAGNOSIS — R001 Bradycardia, unspecified: Secondary | ICD-10-CM

## 2016-08-14 DIAGNOSIS — Z87891 Personal history of nicotine dependence: Secondary | ICD-10-CM

## 2016-08-14 DIAGNOSIS — Z923 Personal history of irradiation: Secondary | ICD-10-CM

## 2016-08-14 DIAGNOSIS — D696 Thrombocytopenia, unspecified: Secondary | ICD-10-CM | POA: Diagnosis not present

## 2016-08-14 DIAGNOSIS — K5730 Diverticulosis of large intestine without perforation or abscess without bleeding: Secondary | ICD-10-CM

## 2016-08-14 DIAGNOSIS — R918 Other nonspecific abnormal finding of lung field: Secondary | ICD-10-CM

## 2016-08-14 DIAGNOSIS — K4090 Unilateral inguinal hernia, without obstruction or gangrene, not specified as recurrent: Secondary | ICD-10-CM

## 2016-08-14 DIAGNOSIS — F419 Anxiety disorder, unspecified: Secondary | ICD-10-CM

## 2016-08-14 DIAGNOSIS — E039 Hypothyroidism, unspecified: Secondary | ICD-10-CM

## 2016-08-14 DIAGNOSIS — M48061 Spinal stenosis, lumbar region without neurogenic claudication: Secondary | ICD-10-CM

## 2016-08-14 DIAGNOSIS — R112 Nausea with vomiting, unspecified: Secondary | ICD-10-CM

## 2016-08-14 DIAGNOSIS — D72819 Decreased white blood cell count, unspecified: Secondary | ICD-10-CM

## 2016-08-14 DIAGNOSIS — I129 Hypertensive chronic kidney disease with stage 1 through stage 4 chronic kidney disease, or unspecified chronic kidney disease: Secondary | ICD-10-CM

## 2016-08-14 LAB — COMPREHENSIVE METABOLIC PANEL
ALK PHOS: 40 U/L (ref 38–126)
ALT: 23 U/L (ref 17–63)
ANION GAP: 7 (ref 5–15)
AST: 22 U/L (ref 15–41)
Albumin: 4.4 g/dL (ref 3.5–5.0)
BILIRUBIN TOTAL: 0.7 mg/dL (ref 0.3–1.2)
BUN: 34 mg/dL — ABNORMAL HIGH (ref 6–20)
CALCIUM: 9.5 mg/dL (ref 8.9–10.3)
CO2: 24 mmol/L (ref 22–32)
CREATININE: 1.5 mg/dL — AB (ref 0.61–1.24)
Chloride: 100 mmol/L — ABNORMAL LOW (ref 101–111)
GFR calc non Af Amer: 46 mL/min — ABNORMAL LOW (ref >60)
GFR, EST AFRICAN AMERICAN: 53 mL/min — AB (ref >60)
GLUCOSE: 119 mg/dL — AB (ref 65–99)
Potassium: 4.8 mmol/L (ref 3.5–5.1)
Sodium: 131 mmol/L — ABNORMAL LOW (ref 135–145)
TOTAL PROTEIN: 7.3 g/dL (ref 6.5–8.1)

## 2016-08-14 LAB — CBC WITH DIFFERENTIAL/PLATELET
Basophils Absolute: 0 10*3/uL (ref 0–0.1)
Basophils Relative: 0 %
Eosinophils Absolute: 0 10*3/uL (ref 0–0.7)
Eosinophils Relative: 0 %
HEMATOCRIT: 33.3 % — AB (ref 40.0–52.0)
HEMOGLOBIN: 11.8 g/dL — AB (ref 13.0–18.0)
LYMPHS ABS: 0.1 10*3/uL — AB (ref 1.0–3.6)
LYMPHS PCT: 6 %
MCH: 35.3 pg — AB (ref 26.0–34.0)
MCHC: 35.4 g/dL (ref 32.0–36.0)
MCV: 99.7 fL (ref 80.0–100.0)
MONOS PCT: 15 %
Monocytes Absolute: 0.3 10*3/uL (ref 0.2–1.0)
NEUTROS ABS: 1.5 10*3/uL (ref 1.4–6.5)
NEUTROS PCT: 79 %
Platelets: 112 10*3/uL — ABNORMAL LOW (ref 150–440)
RBC: 3.34 MIL/uL — AB (ref 4.40–5.90)
RDW: 14.7 % — ABNORMAL HIGH (ref 11.5–14.5)
WBC: 1.9 10*3/uL — AB (ref 3.8–10.6)

## 2016-08-14 MED ORDER — PALONOSETRON HCL INJECTION 0.25 MG/5ML
0.2500 mg | Freq: Once | INTRAVENOUS | Status: AC
  Administered 2016-08-14: 0.25 mg via INTRAVENOUS
  Filled 2016-08-14: qty 5

## 2016-08-14 MED ORDER — SODIUM CHLORIDE 0.9 % IV SOLN
Freq: Once | INTRAVENOUS | Status: AC
  Administered 2016-08-14: 10:00:00 via INTRAVENOUS
  Filled 2016-08-14: qty 1000

## 2016-08-14 MED ORDER — FAMOTIDINE IN NACL 20-0.9 MG/50ML-% IV SOLN
20.0000 mg | Freq: Once | INTRAVENOUS | Status: AC
  Administered 2016-08-14: 20 mg via INTRAVENOUS
  Filled 2016-08-14: qty 50

## 2016-08-14 MED ORDER — SODIUM CHLORIDE 0.9 % IV SOLN
45.0000 mg/m2 | Freq: Once | INTRAVENOUS | Status: AC
  Administered 2016-08-14: 96 mg via INTRAVENOUS
  Filled 2016-08-14: qty 16

## 2016-08-14 MED ORDER — SODIUM CHLORIDE 0.9% FLUSH
10.0000 mL | INTRAVENOUS | Status: DC | PRN
  Administered 2016-08-14: 10 mL
  Filled 2016-08-14: qty 10

## 2016-08-14 MED ORDER — DEXAMETHASONE SODIUM PHOSPHATE 10 MG/ML IJ SOLN
10.0000 mg | Freq: Once | INTRAMUSCULAR | Status: AC
  Administered 2016-08-14: 10 mg via INTRAVENOUS
  Filled 2016-08-14: qty 1

## 2016-08-14 MED ORDER — DIPHENHYDRAMINE HCL 50 MG/ML IJ SOLN
25.0000 mg | Freq: Once | INTRAMUSCULAR | Status: AC
  Administered 2016-08-14: 25 mg via INTRAVENOUS
  Filled 2016-08-14: qty 1

## 2016-08-14 MED ORDER — DEXAMETHASONE SODIUM PHOSPHATE 100 MG/10ML IJ SOLN: 10.0000 mg | Freq: Once | INTRAMUSCULAR | Status: DC

## 2016-08-14 MED ORDER — SODIUM CHLORIDE 0.9 % IV SOLN
180.0000 mg | Freq: Once | INTRAVENOUS | Status: AC
  Administered 2016-08-14: 180 mg via INTRAVENOUS
  Filled 2016-08-14: qty 18

## 2016-08-14 MED ORDER — HEPARIN SOD (PORK) LOCK FLUSH 100 UNIT/ML IV SOLN
500.0000 [IU] | Freq: Once | INTRAVENOUS | Status: AC | PRN
  Administered 2016-08-14: 500 [IU]
  Filled 2016-08-14: qty 5

## 2016-08-14 NOTE — Progress Notes (Signed)
Offers no complaints  

## 2016-08-15 ENCOUNTER — Ambulatory Visit: Payer: Medicare Other

## 2016-08-15 ENCOUNTER — Other Ambulatory Visit: Payer: Self-pay | Admitting: *Deleted

## 2016-08-15 ENCOUNTER — Ambulatory Visit
Admission: RE | Admit: 2016-08-15 | Discharge: 2016-08-15 | Disposition: A | Discharge status: Home / Self Care | Payer: Medicare Other | Source: Ambulatory Visit | Attending: Radiation Oncology | Admitting: Radiation Oncology

## 2016-08-15 DIAGNOSIS — C155 Malignant neoplasm of lower third of esophagus: Secondary | ICD-10-CM | POA: Diagnosis not present

## 2016-08-15 MED ORDER — OXYCODONE-ACETAMINOPHEN 5-325 MG PO TABS: 1.0000 | ORAL_TABLET | Freq: Four times a day (QID) | ORAL | 0 refills | Status: DC | PRN

## 2016-08-16 ENCOUNTER — Ambulatory Visit: Payer: Medicare Other

## 2016-08-16 ENCOUNTER — Ambulatory Visit
Admission: RE | Admit: 2016-08-16 | Discharge: 2016-08-16 | Disposition: A | Discharge status: Home / Self Care | Payer: Medicare Other | Source: Ambulatory Visit | Attending: Radiation Oncology | Admitting: Radiation Oncology

## 2016-08-16 DIAGNOSIS — C155 Malignant neoplasm of lower third of esophagus: Secondary | ICD-10-CM | POA: Diagnosis not present

## 2016-08-19 ENCOUNTER — Ambulatory Visit
Admission: RE | Admit: 2016-08-19 | Discharge: 2016-08-19 | Disposition: A | Discharge status: Home / Self Care | Payer: Medicare Other | Source: Ambulatory Visit | Attending: Radiation Oncology | Admitting: Radiation Oncology

## 2016-08-19 ENCOUNTER — Ambulatory Visit: Payer: Medicare Other

## 2016-08-19 DIAGNOSIS — C155 Malignant neoplasm of lower third of esophagus: Secondary | ICD-10-CM | POA: Diagnosis not present

## 2016-08-20 ENCOUNTER — Ambulatory Visit: Payer: Medicare Other

## 2016-08-20 ENCOUNTER — Ambulatory Visit
Admission: RE | Admit: 2016-08-20 | Discharge: 2016-08-20 | Disposition: A | Discharge status: Home / Self Care | Payer: Medicare Other | Source: Ambulatory Visit | Attending: Radiation Oncology | Admitting: Radiation Oncology

## 2016-08-20 DIAGNOSIS — C155 Malignant neoplasm of lower third of esophagus: Secondary | ICD-10-CM | POA: Diagnosis not present

## 2016-08-20 NOTE — Progress Notes (Signed)
[place]  [phone]) [phone] Fax:(336) [phone]  ID: Corey Perry OB: 01-29-1946  MR#: [id number]  AOZ#:[id number]  Patient Care Team: [name], MD as PCP - General (Family Medicine) [name], MD as PCP - Family Medicine (Family Medicine) [name], MD (Specialist) [name], MD as Referring Physician (Internal Medicine) [name], MD as Consulting Physician (Neurosurgery)  CHIEF COMPLAINT: Stage IIb adenocarcinoma of the lower third of the esophagus.  INTERVAL HISTORY: Patient returns to clinic today for further evaluation and consideration of cycle 6 of 6 of carboplatinum and Taxol. He is tolerating his treatments well without significant side effects. He is tolerating his daily XRT as well. His swallowing is much better today and he has been able to tolerate soft solid foods for the past few days.He still continues to have episodes of vomiting but they are getting better. His weight has stabilized. He denies any recent fevers or illnesses. He has no neurologic complaints. He denies any chest pain or shortness of breath. He denies any constipation or diarrhea. He has no urinary complaints. Patient offers no further specific complaints today.  REVIEW OF SYSTEMS:   Review of Systems  Constitutional: Negative for fever, malaise/fatigue and weight loss.  HENT: Negative.   Respiratory: Negative.  Negative for cough and shortness of breath.   Cardiovascular: Negative.  Negative for chest pain and leg swelling.  Gastrointestinal: Positive for vomiting. Negative for abdominal pain, blood in stool, melena and nausea.  Genitourinary: Negative.   Musculoskeletal: Negative.   Neurological: Negative.  Negative for weakness.  Psychiatric/Behavioral: The patient is nervous/anxious.     As per HPI. Otherwise, a complete review of systems is negative.  PAST MEDICAL HISTORY: Past Medical History:  Diagnosis Date  . Abnormal white blood cell  (WBC) count    seeing Dr. [name] at [place] on 10/25  . BPH (benign prostatic hypertrophy)   . Bradycardia   . CAD (coronary artery disease)   . Chronic kidney disease   . Dysrhythmia    bradycardia - sees Dr. Humphrey Rolls  . Esophageal cancer ([place]) 05/2016  . GERD (gastroesophageal reflux disease)   . Hyperlipidemia   . Hypertension   . Hypothyroidism   . Sleep apnea    mild - SS - Care everywhere -04/18/07  . Spinal stenosis    lumbar    PAST SURGICAL HISTORY: Past Surgical History:  Procedure Laterality Date  . BACK SURGERY    . CARDIAC CATHETERIZATION Left 03/12/2016   Procedure: Left Heart Cath and Coronary Angiography;  Surgeon: [name], MD;  Location: [place] [facility];  Service: Cardiovascular;  Laterality: Left;  . CARDIAC CATHETERIZATION N/A 03/12/2016   Procedure: Coronary Stent Intervention;  Surgeon: Yolonda Kida, MD;  Location: [place] [facility];  Service: Cardiovascular;  Laterality: N/A;  . COLONOSCOPY WITH PROPOFOL N/A 07/13/2015   Procedure: COLONOSCOPY WITH PROPOFOL;  Surgeon: Lucilla Lame, MD;  Location: [place];  Service: Endoscopy;  Laterality: N/A;  . CORONARY ANGIOPLASTY  10/05 and 12/05   4 DES placed  . ESOPHAGOGASTRODUODENOSCOPY (EGD) WITH PROPOFOL N/A 06/14/2016   Procedure: ESOPHAGOGASTRODUODENOSCOPY (EGD) WITH PROPOFOL;  Surgeon: [name], MD;  Location: [facility] ENDOSCOPY;  Service: Endoscopy;  Laterality: N/A;  . HERNIA REPAIR     x3  . POLYPECTOMY  07/13/2015   Procedure: POLYPECTOMY;  Surgeon: Lucilla Lame, MD;  Location: [place];  Service: Endoscopy;;  . PORTACATH PLACEMENT N/A 07/08/2016   Procedure: INSERTION PORT-A-CATH;  Surgeon: Olean Ree, MD;  Location: [facility] ORS;  Service: General;  Laterality: N/A;    FAMILY HISTORY Family History  Problem Relation Age of Onset  . Hypertension Mother   . Heart disease Father   . Hypertension Father   . Kidney disease Father   . Hypertension Brother   .  Diabetes Son        ADVANCED DIRECTIVES:    HEALTH MAINTENANCE: Social History  Substance Use Topics  . Smoking status: Former Smoker    Packs/day: 1.00    Years: 40.00    Types: Cigarettes    Quit date: 07/16/2004  . Smokeless tobacco: Former Systems developer    Types: Chew  . Alcohol use No     Colonoscopy:  PAP:  Bone density:  Lipid panel:  No Known Allergies  Current Outpatient Prescriptions  Medication Sig Dispense Refill  . aspirin 81 MG tablet Take 81 mg by mouth daily. AM    . dexamethasone (DECADRON) 4 MG tablet Take 1 tablet (4 mg total) by mouth 2 (two) times daily with a meal. 30 tablet 0  . docusate (COLACE) 60 MG/15ML syrup Take 15 mLs (60 mg total) by mouth daily. 200 mL 0  . isosorbide mononitrate (IMDUR) 30 MG 24 hr tablet 30 mg daily. AM  5  . levothyroxine (SYNTHROID, LEVOTHROID) 50 MCG tablet Take 1 tablet (50 mcg total) by mouth daily. 90 tablet 4  . lisinopril (PRINIVIL,ZESTRIL) 10 MG tablet Take 1 tablet (10 mg total) by mouth daily. 90 tablet 4  . multivitamin-iron-minerals-folic acid (CENTRUM) chewable tablet Chew 1 tablet by mouth daily.    . nitroGLYCERIN (NITROSTAT) 0.4 MG SL tablet Place 1 tablet under the tongue every 5 (five) minutes as needed.  98  . ondansetron (ZOFRAN) 8 MG tablet Take 1 tablet (8 mg total) by mouth 2 (two) times daily as needed for refractory nausea / vomiting. 30 tablet 1  . oxyCODONE-acetaminophen (PERCOCET/ROXICET) 5-325 MG tablet Take 1 tablet by mouth every 6 (six) hours as needed for severe pain. 60 tablet 0  . pantoprazole (PROTONIX) 20 MG tablet Take 1 tablet (20 mg total) by mouth daily. 90 tablet 4  . prochlorperazine (COMPAZINE) 10 MG tablet Take 1 tablet (10 mg total) by mouth every 6 (six) hours as needed for nausea or vomiting. 30 tablet 0  . simvastatin (ZOCOR) 40 MG tablet Take 1 tablet (40 mg total) by mouth daily. 90 tablet 4  . sucralfate (CARAFATE) 1 g tablet Take 1 tablet (1 g total) by mouth 3 (three) times  daily. Dissolve in 2-3 tbsp of warm water; swish and swallow 90 tablet 3  . ticagrelor (BRILINTA) 90 MG TABS tablet Take 1 tablet by mouth daily.     No current facility-administered medications for this visit.    Facility-Administered Medications Ordered in Other Visits  Medication Dose Route Frequency Provider Last Rate Last Dose  . heparin lock flush 100 unit/mL  500 Units Intravenous Once [name], MD      . sodium chloride 0.9 % injection 10 mL  10 mL Intravenous Once [name], MD      . sodium chloride flush (NS) 0.9 % injection 10 mL  10 mL Intracatheter PRN [name], MD   10 mL at 08/14/16 0930    OBJECTIVE: Vitals:   08/21/16 1041  BP: 109/74  Pulse: 84  Resp: 18  Temp: 98.3 F (36.8 C)     Body mass index is 28.86 kg/m.  ECOG FS:0 - Asymptomatic  General: Well-developed, well-nourished, no acute distress. Eyes: Pink conjunctiva, anicteric sclera. Lungs: Clear to auscultation bilaterally. Heart: Regular rate and rhythm. No rubs, murmurs, or gallops. Abdomen: Soft, nontender, nondistended. No organomegaly noted, normoactive bowel sounds. Musculoskeletal: No edema, cyanosis, or clubbing. Neuro: Alert, answering all questions appropriately. Cranial nerves grossly intact. Skin: No rashes or petechiae noted. Psych: Normal affect.   LAB RESULTS:  Lab Results  Component Value Date   NA 132 (L) 08/21/2016   K 4.3 08/21/2016   CL 100 (L) 08/21/2016   CO2 25 08/21/2016   GLUCOSE 136 (H) 08/21/2016   BUN 27 (H) 08/21/2016   CREATININE 1.47 (H) 08/21/2016   CALCIUM 9.2 08/21/2016   PROT 6.7 08/21/2016   ALBUMIN 4.1 08/21/2016   AST 23 08/21/2016   ALT 22 08/21/2016   ALKPHOS 41 08/21/2016   BILITOT 0.5 08/21/2016   GFRNONAA 47 (L) 08/21/2016   GFRAA 54 (L) 08/21/2016    Lab Results  Component Value Date   WBC 1.8 (L) 08/21/2016   NEUTROABS 1.3 (L) 08/21/2016   HGB 11.7 (L) 08/21/2016   HCT 33.3 (L) 08/21/2016   MCV 101.6 (H)  08/21/2016   PLT 169 08/21/2016     STUDIES: No results found.  ASSESSMENT: Stage IIb adenocarcinoma of the lower third of the esophagus.  PLAN:    1. Stage IIb adenocarcinoma of the lower third of the esophagus: Patient had EUS completed at [facility] confirming the stage and diagnosis. He was seen at [facility] in November and it was determined that no feeding tube is necessary at this time. Patient has a follow-up appointment on January 4th for preadmission screening and further evaluation for esophagectomy. Proceed with cycle 6 of weekly carboplatinum and Taxol today. Continue daily XRT which will be completed on August 27, 2016. Return to clinic one week after surgery.  2. Esophageal obstruction: There is concern of maintaining his nutritional status. By report, patient also required a pediatric scope during his EGD secondary to his stenosis. Patient had consultation with dietary. No feeding tube is necessary at this point, but will consider one in the future if his nutritional status declines. Dietary intake continues to improve. He is eating soft solid food and able to tolerate to date. 3. Thrombocytopenia: Previously entire workup did not reveal a distinct etiology. No bone marrow biopsy has been performed to this point. Continue to monitor closely. Adequate to treat today. 4. Leukopenia: Decreased, proceed with treatment as above. Monitor.  5. Renal insufficiency: Patient's creatinine is slightly elevated, possibly secondary to mild dehydration. Monitor.   Patient expressed understanding and was in agreement with this plan. He also understands that He can call clinic at any time with any questions, concerns, or complaints.   Faythe Casa, RN 08/21/2016 11:18 AM  Patient was seen and evaluated independently and I agree with the assessment and plan as dictated above. Patient's platelet count is now within normal limits. Continue to monitor closely.  [name], MD  08/23/16 4:46 PM

## 2016-08-21 ENCOUNTER — Ambulatory Visit: Payer: Medicare Other

## 2016-08-21 ENCOUNTER — Ambulatory Visit
Admission: RE | Admit: 2016-08-21 | Discharge: 2016-08-21 | Disposition: A | Discharge status: Home / Self Care | Payer: Medicare Other | Source: Ambulatory Visit | Attending: Radiation Oncology | Admitting: Radiation Oncology

## 2016-08-21 ENCOUNTER — Inpatient Hospital Stay: Payer: Medicare Other

## 2016-08-21 ENCOUNTER — Inpatient Hospital Stay: Payer: Medicare Other | Attending: Oncology

## 2016-08-21 ENCOUNTER — Inpatient Hospital Stay (HOSPITAL_BASED_OUTPATIENT_CLINIC_OR_DEPARTMENT_OTHER): Payer: Medicare Other | Admitting: Oncology

## 2016-08-21 VITALS — BP 109/74 | HR 84 | Temp 98.3°F | Resp 18 | Wt 195.4 lb

## 2016-08-21 DIAGNOSIS — D696 Thrombocytopenia, unspecified: Secondary | ICD-10-CM

## 2016-08-21 DIAGNOSIS — R112 Nausea with vomiting, unspecified: Secondary | ICD-10-CM | POA: Diagnosis not present

## 2016-08-21 DIAGNOSIS — Z7982 Long term (current) use of aspirin: Secondary | ICD-10-CM | POA: Diagnosis not present

## 2016-08-21 DIAGNOSIS — Z5111 Encounter for antineoplastic chemotherapy: Secondary | ICD-10-CM | POA: Insufficient documentation

## 2016-08-21 DIAGNOSIS — N189 Chronic kidney disease, unspecified: Secondary | ICD-10-CM

## 2016-08-21 DIAGNOSIS — K222 Esophageal obstruction: Secondary | ICD-10-CM | POA: Diagnosis not present

## 2016-08-21 DIAGNOSIS — I2510 Atherosclerotic heart disease of native coronary artery without angina pectoris: Secondary | ICD-10-CM | POA: Diagnosis not present

## 2016-08-21 DIAGNOSIS — G4730 Sleep apnea, unspecified: Secondary | ICD-10-CM

## 2016-08-21 DIAGNOSIS — M48061 Spinal stenosis, lumbar region without neurogenic claudication: Secondary | ICD-10-CM

## 2016-08-21 DIAGNOSIS — C155 Malignant neoplasm of lower third of esophagus: Secondary | ICD-10-CM | POA: Diagnosis not present

## 2016-08-21 DIAGNOSIS — D72819 Decreased white blood cell count, unspecified: Secondary | ICD-10-CM | POA: Diagnosis not present

## 2016-08-21 DIAGNOSIS — N400 Enlarged prostate without lower urinary tract symptoms: Secondary | ICD-10-CM | POA: Insufficient documentation

## 2016-08-21 DIAGNOSIS — I129 Hypertensive chronic kidney disease with stage 1 through stage 4 chronic kidney disease, or unspecified chronic kidney disease: Secondary | ICD-10-CM | POA: Insufficient documentation

## 2016-08-21 DIAGNOSIS — F419 Anxiety disorder, unspecified: Secondary | ICD-10-CM

## 2016-08-21 DIAGNOSIS — C801 Malignant (primary) neoplasm, unspecified: Secondary | ICD-10-CM

## 2016-08-21 DIAGNOSIS — E039 Hypothyroidism, unspecified: Secondary | ICD-10-CM

## 2016-08-21 DIAGNOSIS — R001 Bradycardia, unspecified: Secondary | ICD-10-CM | POA: Insufficient documentation

## 2016-08-21 DIAGNOSIS — Z87891 Personal history of nicotine dependence: Secondary | ICD-10-CM

## 2016-08-21 DIAGNOSIS — E785 Hyperlipidemia, unspecified: Secondary | ICD-10-CM | POA: Insufficient documentation

## 2016-08-21 DIAGNOSIS — K219 Gastro-esophageal reflux disease without esophagitis: Secondary | ICD-10-CM | POA: Insufficient documentation

## 2016-08-21 DIAGNOSIS — Z79899 Other long term (current) drug therapy: Secondary | ICD-10-CM | POA: Diagnosis not present

## 2016-08-21 LAB — CBC WITH DIFFERENTIAL/PLATELET
BASOS PCT: 0 %
Basophils Absolute: 0 10*3/uL (ref 0–0.1)
Eosinophils Absolute: 0 10*3/uL (ref 0–0.7)
Eosinophils Relative: 0 %
HEMATOCRIT: 33.3 % — AB (ref 40.0–52.0)
Hemoglobin: 11.7 g/dL — ABNORMAL LOW (ref 13.0–18.0)
LYMPHS ABS: 0.2 10*3/uL — AB (ref 1.0–3.6)
LYMPHS PCT: 9 %
MCH: 35.8 pg — AB (ref 26.0–34.0)
MCHC: 35.2 g/dL (ref 32.0–36.0)
MCV: 101.6 fL — AB (ref 80.0–100.0)
MONO ABS: 0.4 10*3/uL (ref 0.2–1.0)
MONOS PCT: 20 %
NEUTROS ABS: 1.3 10*3/uL — AB (ref 1.4–6.5)
Neutrophils Relative %: 71 %
Platelets: 169 10*3/uL (ref 150–440)
RBC: 3.28 MIL/uL — ABNORMAL LOW (ref 4.40–5.90)
RDW: 16.2 % — AB (ref 11.5–14.5)
WBC: 1.8 10*3/uL — ABNORMAL LOW (ref 3.8–10.6)

## 2016-08-21 LAB — COMPREHENSIVE METABOLIC PANEL
ALT: 22 U/L (ref 17–63)
ANION GAP: 7 (ref 5–15)
AST: 23 U/L (ref 15–41)
Albumin: 4.1 g/dL (ref 3.5–5.0)
Alkaline Phosphatase: 41 U/L (ref 38–126)
BILIRUBIN TOTAL: 0.5 mg/dL (ref 0.3–1.2)
BUN: 27 mg/dL — ABNORMAL HIGH (ref 6–20)
CALCIUM: 9.2 mg/dL (ref 8.9–10.3)
CO2: 25 mmol/L (ref 22–32)
Chloride: 100 mmol/L — ABNORMAL LOW (ref 101–111)
Creatinine, Ser: 1.47 mg/dL — ABNORMAL HIGH (ref 0.61–1.24)
GFR, EST AFRICAN AMERICAN: 54 mL/min — AB (ref >60)
GFR, EST NON AFRICAN AMERICAN: 47 mL/min — AB (ref >60)
Glucose, Bld: 136 mg/dL — ABNORMAL HIGH (ref 65–99)
POTASSIUM: 4.3 mmol/L (ref 3.5–5.1)
Sodium: 132 mmol/L — ABNORMAL LOW (ref 135–145)
TOTAL PROTEIN: 6.7 g/dL (ref 6.5–8.1)

## 2016-08-21 MED ORDER — SODIUM CHLORIDE 0.9 % IV SOLN: 10.0000 mg | Freq: Once | INTRAVENOUS | Status: DC

## 2016-08-21 MED ORDER — DIPHENHYDRAMINE HCL 50 MG/ML IJ SOLN
25.0000 mg | Freq: Once | INTRAMUSCULAR | Status: AC
  Administered 2016-08-21: 25 mg via INTRAVENOUS
  Filled 2016-08-21: qty 1

## 2016-08-21 MED ORDER — FAMOTIDINE IN NACL 20-0.9 MG/50ML-% IV SOLN
20.0000 mg | Freq: Once | INTRAVENOUS | Status: AC
  Administered 2016-08-21: 20 mg via INTRAVENOUS
  Filled 2016-08-21: qty 50

## 2016-08-21 MED ORDER — HEPARIN SOD (PORK) LOCK FLUSH 100 UNIT/ML IV SOLN
500.0000 [IU] | Freq: Once | INTRAVENOUS | Status: AC
  Administered 2016-08-21: 500 [IU] via INTRAVENOUS
  Filled 2016-08-21: qty 5

## 2016-08-21 MED ORDER — PALONOSETRON HCL INJECTION 0.25 MG/5ML
0.2500 mg | Freq: Once | INTRAVENOUS | Status: AC
  Administered 2016-08-21: 0.25 mg via INTRAVENOUS

## 2016-08-21 MED ORDER — SODIUM CHLORIDE 0.9 % IV SOLN
Freq: Once | INTRAVENOUS | Status: AC
  Administered 2016-08-21: 12:00:00 via INTRAVENOUS
  Filled 2016-08-21: qty 1000

## 2016-08-21 MED ORDER — SODIUM CHLORIDE 0.9 % IV SOLN
45.0000 mg/m2 | Freq: Once | INTRAVENOUS | Status: AC
  Administered 2016-08-21: 96 mg via INTRAVENOUS
  Filled 2016-08-21: qty 16

## 2016-08-21 MED ORDER — SODIUM CHLORIDE 0.9 % IJ SOLN
10.0000 mL | Freq: Once | INTRAMUSCULAR | Status: AC
  Administered 2016-08-21: 10 mL via INTRAVENOUS
  Filled 2016-08-21: qty 10

## 2016-08-21 MED ORDER — SODIUM CHLORIDE 0.9 % IV SOLN
176.8000 mg | Freq: Once | INTRAVENOUS | Status: AC
  Administered 2016-08-21: 180 mg via INTRAVENOUS
  Filled 2016-08-21: qty 18

## 2016-08-21 MED ORDER — DEXAMETHASONE SODIUM PHOSPHATE 10 MG/ML IJ SOLN
10.0000 mg | Freq: Once | INTRAMUSCULAR | Status: AC
  Administered 2016-08-21: 10 mg via INTRAVENOUS
  Filled 2016-08-21: qty 3
  Filled 2016-08-21: qty 1

## 2016-08-21 NOTE — Progress Notes (Signed)
Offers no complaints. States is feeling well. 

## 2016-08-22 ENCOUNTER — Ambulatory Visit
Admission: RE | Admit: 2016-08-22 | Discharge: 2016-08-22 | Disposition: A | Discharge status: Home / Self Care | Payer: Medicare Other | Source: Ambulatory Visit | Attending: Radiation Oncology | Admitting: Radiation Oncology

## 2016-08-22 DIAGNOSIS — C155 Malignant neoplasm of lower third of esophagus: Secondary | ICD-10-CM | POA: Diagnosis not present

## 2016-08-23 ENCOUNTER — Ambulatory Visit
Admission: RE | Admit: 2016-08-23 | Discharge: 2016-08-23 | Disposition: A | Discharge status: Home / Self Care | Payer: Medicare Other | Source: Ambulatory Visit | Attending: Radiation Oncology | Admitting: Radiation Oncology

## 2016-08-23 DIAGNOSIS — C155 Malignant neoplasm of lower third of esophagus: Secondary | ICD-10-CM | POA: Diagnosis not present

## 2016-08-26 ENCOUNTER — Ambulatory Visit: Payer: Medicare Other

## 2016-08-26 ENCOUNTER — Ambulatory Visit
Admission: RE | Admit: 2016-08-26 | Discharge: 2016-08-26 | Disposition: A | Discharge status: Home / Self Care | Payer: Medicare Other | Source: Ambulatory Visit | Attending: Radiation Oncology | Admitting: Radiation Oncology

## 2016-08-26 DIAGNOSIS — C155 Malignant neoplasm of lower third of esophagus: Secondary | ICD-10-CM | POA: Diagnosis not present

## 2016-08-27 ENCOUNTER — Ambulatory Visit: Payer: Medicare Other

## 2016-08-27 ENCOUNTER — Ambulatory Visit
Admission: RE | Admit: 2016-08-27 | Discharge: 2016-08-27 | Disposition: A | Discharge status: Home / Self Care | Payer: Medicare Other | Source: Ambulatory Visit | Attending: Radiation Oncology | Admitting: Radiation Oncology

## 2016-08-27 DIAGNOSIS — C155 Malignant neoplasm of lower third of esophagus: Secondary | ICD-10-CM | POA: Diagnosis not present

## 2016-09-23 NOTE — Progress Notes (Signed)
[place]  Telephone:(336) [phone] Fax:(336) [phone]  ID: [name] OB: 1946/05/22  MR#: [id number]  UXN#:[id number]  Patient Care Team: [name], MD as PCP - General (Family Medicine) [name], MD as PCP - Family Medicine (Family Medicine) [name], MD (Specialist) [name], MD as Referring Physician (Internal Medicine) [name], MD as Consulting Physician (Neurosurgery)  CHIEF COMPLAINT: Stage IIb adenocarcinoma of the lower third of the esophagus.  INTERVAL HISTORY: Patient returns to clinic today for further evaluation.  He is scheduled for an esophagectomy at [facility] on 10/07/16. He has completed XRT and 6 cycles of carboplatinum and Taxol, tolerating his treatments well without significant side effects.  His swallowing is much improved and he reports being able to tolerate solid foods. He has occasional feelings of nausea, especially in the morning, controlled with zofran, and reports no recent vomiting.   His weight has increased 16 pounds since 08/21/16. He denies any recent fevers or illnesses. He has no neurologic complaints. He denies any chest pain or shortness of breath. He denies any constipation or diarrhea. He reports nocturia 2-3 times per night, no other urinary complaints. Patient offers no further specific complaints today.  REVIEW OF SYSTEMS:   Review of Systems  Constitutional: Negative for fever, malaise/fatigue and weight loss.  HENT: Negative.   Respiratory: Negative.  Negative for cough and shortness of breath.   Cardiovascular: Negative.  Negative for chest pain and leg swelling.  Gastrointestinal: Positive for nausea. Negative for abdominal pain, blood in stool, constipation, diarrhea, melena and vomiting.  Genitourinary: Negative for frequency and urgency.       Nocturia  Musculoskeletal: Negative.   Neurological: Negative.  Negative for tingling, weakness and headaches.  Psychiatric/Behavioral: The patient  does not have insomnia.     As per HPI. Otherwise, a complete review of systems is negative.  PAST MEDICAL HISTORY: Past Medical History:  Diagnosis Date  . Abnormal white blood cell (WBC) count    seeing Dr. [name] at [place] on 10/25  . BPH (benign prostatic hypertrophy)   . Bradycardia   . CAD (coronary artery disease)   . Chronic kidney disease   . Dysrhythmia    bradycardia - sees Dr. Humphrey Rolls  . Esophageal cancer ([place]) 05/2016  . GERD (gastroesophageal reflux disease)   . Hyperlipidemia   . Hypertension   . Hypothyroidism   . Sleep apnea    mild - SS - Care everywhere -04/18/07  . Spinal stenosis    lumbar    PAST SURGICAL HISTORY: Past Surgical History:  Procedure Laterality Date  . BACK SURGERY    . CARDIAC CATHETERIZATION Left 03/12/2016   Procedure: Left Heart Cath and Coronary Angiography;  Surgeon: [name], MD;  Location: [place] [facility];  Service: Cardiovascular;  Laterality: Left;  . CARDIAC CATHETERIZATION N/A 03/12/2016   Procedure: Coronary Stent Intervention;  Surgeon: Yolonda Kida, MD;  Location: [place] [facility];  Service: Cardiovascular;  Laterality: N/A;  . COLONOSCOPY WITH PROPOFOL N/A 07/13/2015   Procedure: COLONOSCOPY WITH PROPOFOL;  Surgeon: Lucilla Lame, MD;  Location: [place];  Service: Endoscopy;  Laterality: N/A;  . CORONARY ANGIOPLASTY  10/05 and 12/05   4 DES placed  . ESOPHAGOGASTRODUODENOSCOPY (EGD) WITH PROPOFOL N/A 06/14/2016   Procedure: ESOPHAGOGASTRODUODENOSCOPY (EGD) WITH PROPOFOL;  Surgeon: [name], MD;  Location: [facility] ENDOSCOPY;  Service: Endoscopy;  Laterality: N/A;  . HERNIA REPAIR     x3  . POLYPECTOMY  07/13/2015   Procedure: POLYPECTOMY;  Surgeon: Lucilla Lame, MD;  Location: [place];  Service: Endoscopy;;  . PORTACATH PLACEMENT N/A 07/08/2016   Procedure: INSERTION PORT-A-CATH;  Surgeon: Olean Ree, MD;  Location: [facility] ORS;  Service: General;  Laterality: N/A;    FAMILY  HISTORY Family History  Problem Relation Age of Onset  . Hypertension Mother   . Heart disease Father   . Hypertension Father   . Kidney disease Father   . Hypertension Brother   . Diabetes Son        ADVANCED DIRECTIVES:    HEALTH MAINTENANCE: Social History  Substance Use Topics  . Smoking status: Former Smoker    Packs/day: 1.00    Years: 40.00    Types: Cigarettes    Quit date: 07/16/2004  . Smokeless tobacco: Former Systems developer    Types: Chew  . Alcohol use No     Colonoscopy:  PAP:  Bone density:  Lipid panel:  No Known Allergies  Current Outpatient Prescriptions  Medication Sig Dispense Refill  . aspirin 81 MG tablet Take 81 mg by mouth daily. AM    . docusate (COLACE) 60 MG/15ML syrup Take 15 mLs (60 mg total) by mouth daily. 200 mL 0  . isosorbide mononitrate (IMDUR) 30 MG 24 hr tablet 30 mg daily. AM  5  . levothyroxine (SYNTHROID, LEVOTHROID) 50 MCG tablet Take 1 tablet (50 mcg total) by mouth daily. 90 tablet 4  . lisinopril (PRINIVIL,ZESTRIL) 10 MG tablet Take 1 tablet (10 mg total) by mouth daily. 90 tablet 4  . multivitamin-iron-minerals-folic acid (CENTRUM) chewable tablet Chew 1 tablet by mouth daily.    . nitroGLYCERIN (NITROSTAT) 0.4 MG SL tablet Place 1 tablet under the tongue every 5 (five) minutes as needed.  98  . oxyCODONE-acetaminophen (PERCOCET/ROXICET) 5-325 MG tablet Take 1 tablet by mouth every 6 (six) hours as needed for severe pain. 60 tablet 0  . pantoprazole (PROTONIX) 20 MG tablet Take 1 tablet (20 mg total) by mouth daily. 90 tablet 4  . simvastatin (ZOCOR) 40 MG tablet Take 1 tablet (40 mg total) by mouth daily. 90 tablet 4  . ticagrelor (BRILINTA) 90 MG TABS tablet Take 1 tablet by mouth daily.    Marland Kitchen dexamethasone (DECADRON) 4 MG tablet Take 1 tablet (4 mg total) by mouth 2 (two) times daily with a meal. 30 tablet 0  . ondansetron (ZOFRAN) 8 MG tablet Take 1 tablet (8 mg total) by mouth 2 (two) times daily as needed for refractory nausea /  vomiting. (Patient not taking: Reported on 09/25/2016) 30 tablet 1  . prochlorperazine (COMPAZINE) 10 MG tablet Take 1 tablet (10 mg total) by mouth every 6 (six) hours as needed for nausea or vomiting. (Patient not taking: Reported on 09/25/2016) 30 tablet 0  . sucralfate (CARAFATE) 1 g tablet Take 1 tablet (1 g total) by mouth 3 (three) times daily. Dissolve in 2-3 tbsp of warm water; swish and swallow (Patient not taking: Reported on 09/25/2016) 90 tablet 3   No current facility-administered medications for this visit.    Facility-Administered Medications Ordered in Other Visits  Medication Dose Route Frequency Provider Last Rate Last Dose  . sodium chloride flush (NS) 0.9 % injection 10 mL  10 mL Intracatheter PRN [name], MD   10 mL at 08/14/16 0930    OBJECTIVE: Vitals:   09/25/16 1430  BP: 112/73  Pulse: 80  Resp: 18  Temp: 98.2 F (36.8 C)     Body mass  index is 31.24 kg/m.    ECOG FS:0 - Asymptomatic  General: Well-developed, well-nourished, no acute distress. Eyes: Pink conjunctiva, anicteric sclera. Lungs: Clear to auscultation bilaterally. Heart: Regular rate and rhythm. No rubs, murmurs, or gallops. Abdomen: Soft, nontender, nondistended. No organomegaly noted, normoactive bowel sounds. Musculoskeletal: No edema, cyanosis, or clubbing. Neuro: Alert, answering all questions appropriately. Cranial nerves grossly intact. Skin: No rashes or petechiae noted. Psych: Normal affect.   LAB RESULTS:  Lab Results  Component Value Date   NA 132 (L) 08/21/2016   K 4.3 08/21/2016   CL 100 (L) 08/21/2016   CO2 25 08/21/2016   GLUCOSE 136 (H) 08/21/2016   BUN 27 (H) 08/21/2016   CREATININE 1.47 (H) 08/21/2016   CALCIUM 9.2 08/21/2016   PROT 6.7 08/21/2016   ALBUMIN 4.1 08/21/2016   AST 23 08/21/2016   ALT 22 08/21/2016   ALKPHOS 41 08/21/2016   BILITOT 0.5 08/21/2016   GFRNONAA 47 (L) 08/21/2016   GFRAA 54 (L) 08/21/2016    Lab Results  Component Value  Date   WBC 1.8 (L) 08/21/2016   NEUTROABS 1.3 (L) 08/21/2016   HGB 11.7 (L) 08/21/2016   HCT 33.3 (L) 08/21/2016   MCV 101.6 (H) 08/21/2016   PLT 169 08/21/2016     STUDIES: No results found.  ASSESSMENT: Stage IIb adenocarcinoma of the lower third of the esophagus.  PLAN:    1. Stage IIb adenocarcinoma of the lower third of the esophagus: Patient had EUS completed at [facility] confirming the stage and diagnosis. He was seen at [facility] in November and it was determined that no feeding tube is necessary at this time. Patient had preadmission screening and labs at [facility] on January 9th and has esophagectomy scheduled on October 07, 2016. Six cycles of weekly carboplatinum and Taxol completed on 08/21/16.  Daily XRT completed on 08/27/16. Return to clinic six weeks after surgery for labs and re-assessment.  2. Esophageal obstruction: There is concern of maintaining his nutritional status. By report, patient also required a pediatric scope during his EGD secondary to his stenosis. Patient had consultation with dietary. No feeding tube is necessary at this point, but will consider one in the future if his nutritional status declines. Dietary intake continues to improve. He is eating soft solid food and able to tolerate to date. 3. Thrombocytopenia: Most recent platelet count was within normal limits. Previously entire workup did not reveal a distinct etiology. No bone marrow biopsy has been performed to this point. Continue to monitor closely.  4. Renal insufficiency: Patient's creatinine is slightly elevated, possibly secondary to mild dehydration. Monitor. 5. Nocturia: Discussed drinking more fluids earlier in the day, stopping around dinnertime.   Patient expressed understanding and was in agreement with this plan. He also understands that He can call clinic at any time with any questions, concerns, or complaints.   Lucendia Herrlich, NP  09/27/16 12:53 PM   Patient was seen and  evaluated independently and I agree with the assessment and plan as dictated above.  [name], MD 09/27/16 12:54 PM

## 2016-09-24 DIAGNOSIS — Z87891 Personal history of nicotine dependence: Secondary | ICD-10-CM | POA: Diagnosis not present

## 2016-09-24 DIAGNOSIS — C159 Malignant neoplasm of esophagus, unspecified: Secondary | ICD-10-CM | POA: Diagnosis not present

## 2016-09-24 DIAGNOSIS — C155 Malignant neoplasm of lower third of esophagus: Secondary | ICD-10-CM | POA: Diagnosis not present

## 2016-09-24 DIAGNOSIS — K228 Other specified diseases of esophagus: Secondary | ICD-10-CM | POA: Diagnosis not present

## 2016-09-24 DIAGNOSIS — I252 Old myocardial infarction: Secondary | ICD-10-CM | POA: Diagnosis not present

## 2016-09-24 DIAGNOSIS — I4581 Long QT syndrome: Secondary | ICD-10-CM | POA: Diagnosis not present

## 2016-09-24 DIAGNOSIS — I10 Essential (primary) hypertension: Secondary | ICD-10-CM | POA: Diagnosis not present

## 2016-09-25 ENCOUNTER — Inpatient Hospital Stay: Payer: Medicare Other

## 2016-09-25 ENCOUNTER — Inpatient Hospital Stay: Payer: Medicare Other | Attending: Oncology | Admitting: Oncology

## 2016-09-25 ENCOUNTER — Ambulatory Visit
Admission: RE | Admit: 2016-09-25 | Discharge: 2016-09-25 | Disposition: A | Discharge status: Home / Self Care | Payer: Medicare Other | Source: Ambulatory Visit | Attending: Radiation Oncology | Admitting: Radiation Oncology

## 2016-09-25 ENCOUNTER — Ambulatory Visit: Payer: Self-pay | Admitting: Radiation Oncology

## 2016-09-25 VITALS — BP 112/73 | HR 80 | Temp 98.2°F | Resp 18 | Wt 211.5 lb

## 2016-09-25 DIAGNOSIS — R110 Nausea: Secondary | ICD-10-CM | POA: Diagnosis not present

## 2016-09-25 DIAGNOSIS — C155 Malignant neoplasm of lower third of esophagus: Secondary | ICD-10-CM

## 2016-09-25 DIAGNOSIS — K222 Esophageal obstruction: Secondary | ICD-10-CM

## 2016-09-25 DIAGNOSIS — Z79899 Other long term (current) drug therapy: Secondary | ICD-10-CM | POA: Diagnosis not present

## 2016-09-25 DIAGNOSIS — G4730 Sleep apnea, unspecified: Secondary | ICD-10-CM

## 2016-09-25 DIAGNOSIS — Z7982 Long term (current) use of aspirin: Secondary | ICD-10-CM

## 2016-09-25 DIAGNOSIS — Z923 Personal history of irradiation: Secondary | ICD-10-CM | POA: Diagnosis not present

## 2016-09-25 DIAGNOSIS — K219 Gastro-esophageal reflux disease without esophagitis: Secondary | ICD-10-CM | POA: Diagnosis not present

## 2016-09-25 DIAGNOSIS — R000 Tachycardia, unspecified: Secondary | ICD-10-CM | POA: Diagnosis not present

## 2016-09-25 DIAGNOSIS — M48061 Spinal stenosis, lumbar region without neurogenic claudication: Secondary | ICD-10-CM | POA: Diagnosis not present

## 2016-09-25 DIAGNOSIS — I129 Hypertensive chronic kidney disease with stage 1 through stage 4 chronic kidney disease, or unspecified chronic kidney disease: Secondary | ICD-10-CM

## 2016-09-25 DIAGNOSIS — I2510 Atherosclerotic heart disease of native coronary artery without angina pectoris: Secondary | ICD-10-CM | POA: Diagnosis not present

## 2016-09-25 DIAGNOSIS — E039 Hypothyroidism, unspecified: Secondary | ICD-10-CM | POA: Diagnosis not present

## 2016-09-25 DIAGNOSIS — R351 Nocturia: Secondary | ICD-10-CM

## 2016-09-25 DIAGNOSIS — D696 Thrombocytopenia, unspecified: Secondary | ICD-10-CM

## 2016-09-25 DIAGNOSIS — Z87891 Personal history of nicotine dependence: Secondary | ICD-10-CM | POA: Diagnosis not present

## 2016-09-25 DIAGNOSIS — N400 Enlarged prostate without lower urinary tract symptoms: Secondary | ICD-10-CM | POA: Diagnosis not present

## 2016-09-25 DIAGNOSIS — N189 Chronic kidney disease, unspecified: Secondary | ICD-10-CM

## 2016-09-25 DIAGNOSIS — E785 Hyperlipidemia, unspecified: Secondary | ICD-10-CM

## 2016-09-25 DIAGNOSIS — Z9221 Personal history of antineoplastic chemotherapy: Secondary | ICD-10-CM | POA: Insufficient documentation

## 2016-09-25 NOTE — Progress Notes (Signed)
Radiation Oncology Follow up Note  Name: Corey Perry   Date:   09/25/2016 MRN:  [id number] DOB: 01-Apr-1946    This 71 y.o. male presents to the clinic today for one-month follow-up status post chemoradiation for stage IIB (T3 N0 M0) adenocarcinoma the distal esophagus.  REFERRING PROVIDER: [name], MD  HPI: Patient is a 71 year old male now out 1 month having completed concurrent chemoradiation for a T3 N0 distal esophageal adenocarcinoma. Seen today in routine follow-up he is doing well. He is putting on weight he is having no significant dysphagia. He's been seen at [facility] for resection had a PET CT scan showing Persistent thickening of the distal esophagus with decreased FDG  activity when compared to prior, most consistent with interval treatment  response. He is scheduled for surgery later this month.   2.No definite evidence of distal FDG-avid metastatic disease.   COMPLICATIONS OF TREATMENT: none  FOLLOW UP COMPLIANCE: keeps appointments   PHYSICAL EXAM:  There were no vitals taken for this visit. Well-developed well-nourished patient in NAD. HEENT reveals PERLA, EOMI, discs not visualized.  Oral cavity is clear. No oral mucosal lesions are identified. Neck is clear without evidence of cervical or supraclavicular adenopathy. Lungs are clear to A&P. Cardiac examination is essentially unremarkable with regular rate and rhythm without murmur rub or thrill. Abdomen is benign with no organomegaly or masses noted. Motor sensory and DTR levels are equal and symmetric in the upper and lower extremities. Cranial nerves II through XII are grossly intact. Proprioception is intact. No peripheral adenopathy or edema is identified. No motor or sensory levels are noted. Crude visual fields are within normal range.  RADIOLOGY RESULTS: PET CT scan results reviewed  PLAN: Present time patient will undergo distal esophagectomy. I've asked to see him that in  approximately 3 months for follow-up and will review his pathology at that time. He or has a follow-up with medical oncology for possible continuation of chemotherapy. Patient otherwise is doing extremely well with obvious excellent response by PET CT criteria. Patient knows to call with any concerns.  I would like to take this opportunity to thank you for allowing me to participate in the care of your patient.Armstead Peaks., MD

## 2016-09-25 NOTE — Progress Notes (Signed)
Patient is here for follow up, he is doing well

## 2016-10-07 DIAGNOSIS — N179 Acute kidney failure, unspecified: Secondary | ICD-10-CM | POA: Diagnosis not present

## 2016-10-07 DIAGNOSIS — I2510 Atherosclerotic heart disease of native coronary artery without angina pectoris: Secondary | ICD-10-CM | POA: Diagnosis present

## 2016-10-07 DIAGNOSIS — Z6830 Body mass index (BMI) 30.0-30.9, adult: Secondary | ICD-10-CM | POA: Diagnosis not present

## 2016-10-07 DIAGNOSIS — D696 Thrombocytopenia, unspecified: Secondary | ICD-10-CM | POA: Diagnosis present

## 2016-10-07 DIAGNOSIS — Z955 Presence of coronary angioplasty implant and graft: Secondary | ICD-10-CM | POA: Diagnosis not present

## 2016-10-07 DIAGNOSIS — Z7982 Long term (current) use of aspirin: Secondary | ICD-10-CM | POA: Diagnosis not present

## 2016-10-07 DIAGNOSIS — R918 Other nonspecific abnormal finding of lung field: Secondary | ICD-10-CM | POA: Diagnosis not present

## 2016-10-07 DIAGNOSIS — I252 Old myocardial infarction: Secondary | ICD-10-CM | POA: Diagnosis not present

## 2016-10-07 DIAGNOSIS — J9811 Atelectasis: Secondary | ICD-10-CM | POA: Diagnosis not present

## 2016-10-07 DIAGNOSIS — J939 Pneumothorax, unspecified: Secondary | ICD-10-CM | POA: Diagnosis not present

## 2016-10-07 DIAGNOSIS — Z9889 Other specified postprocedural states: Secondary | ICD-10-CM | POA: Diagnosis not present

## 2016-10-07 DIAGNOSIS — N400 Enlarged prostate without lower urinary tract symptoms: Secondary | ICD-10-CM | POA: Diagnosis present

## 2016-10-07 DIAGNOSIS — K219 Gastro-esophageal reflux disease without esophagitis: Secondary | ICD-10-CM | POA: Diagnosis present

## 2016-10-07 DIAGNOSIS — Z923 Personal history of irradiation: Secondary | ICD-10-CM | POA: Diagnosis not present

## 2016-10-07 DIAGNOSIS — E669 Obesity, unspecified: Secondary | ICD-10-CM | POA: Diagnosis present

## 2016-10-07 DIAGNOSIS — C159 Malignant neoplasm of esophagus, unspecified: Secondary | ICD-10-CM | POA: Diagnosis present

## 2016-10-07 DIAGNOSIS — I4891 Unspecified atrial fibrillation: Secondary | ICD-10-CM | POA: Diagnosis not present

## 2016-10-07 DIAGNOSIS — E785 Hyperlipidemia, unspecified: Secondary | ICD-10-CM | POA: Diagnosis present

## 2016-10-07 DIAGNOSIS — T8172XA Complication of vein following a procedure, not elsewhere classified, initial encounter: Secondary | ICD-10-CM | POA: Diagnosis not present

## 2016-10-07 DIAGNOSIS — J90 Pleural effusion, not elsewhere classified: Secondary | ICD-10-CM | POA: Diagnosis not present

## 2016-10-07 DIAGNOSIS — Z9221 Personal history of antineoplastic chemotherapy: Secondary | ICD-10-CM | POA: Diagnosis not present

## 2016-10-07 DIAGNOSIS — E039 Hypothyroidism, unspecified: Secondary | ICD-10-CM | POA: Diagnosis present

## 2016-10-07 DIAGNOSIS — I129 Hypertensive chronic kidney disease with stage 1 through stage 4 chronic kidney disease, or unspecified chronic kidney disease: Secondary | ICD-10-CM | POA: Diagnosis present

## 2016-10-07 DIAGNOSIS — G8918 Other acute postprocedural pain: Secondary | ICD-10-CM | POA: Diagnosis not present

## 2016-10-07 DIAGNOSIS — C155 Malignant neoplasm of lower third of esophagus: Secondary | ICD-10-CM | POA: Diagnosis not present

## 2016-10-07 DIAGNOSIS — N183 Chronic kidney disease, stage 3 (moderate): Secondary | ICD-10-CM | POA: Diagnosis present

## 2016-10-07 DIAGNOSIS — I808 Phlebitis and thrombophlebitis of other sites: Secondary | ICD-10-CM | POA: Diagnosis not present

## 2016-10-07 DIAGNOSIS — R109 Unspecified abdominal pain: Secondary | ICD-10-CM | POA: Diagnosis not present

## 2016-10-07 DIAGNOSIS — K222 Esophageal obstruction: Secondary | ICD-10-CM | POA: Diagnosis not present

## 2016-10-07 HISTORY — PX: ESOPHAGECTOMY: SUR457

## 2016-11-05 DIAGNOSIS — I2510 Atherosclerotic heart disease of native coronary artery without angina pectoris: Secondary | ICD-10-CM | POA: Diagnosis not present

## 2016-11-05 DIAGNOSIS — I129 Hypertensive chronic kidney disease with stage 1 through stage 4 chronic kidney disease, or unspecified chronic kidney disease: Secondary | ICD-10-CM | POA: Diagnosis not present

## 2016-11-05 DIAGNOSIS — D696 Thrombocytopenia, unspecified: Secondary | ICD-10-CM | POA: Diagnosis not present

## 2016-11-05 DIAGNOSIS — N183 Chronic kidney disease, stage 3 (moderate): Secondary | ICD-10-CM | POA: Diagnosis not present

## 2016-11-05 DIAGNOSIS — R918 Other nonspecific abnormal finding of lung field: Secondary | ICD-10-CM | POA: Diagnosis not present

## 2016-11-05 DIAGNOSIS — C159 Malignant neoplasm of esophagus, unspecified: Secondary | ICD-10-CM | POA: Diagnosis not present

## 2016-11-18 ENCOUNTER — Other Ambulatory Visit: Payer: Self-pay | Admitting: Family Medicine

## 2016-11-18 DIAGNOSIS — I10 Essential (primary) hypertension: Secondary | ICD-10-CM

## 2016-11-18 MED ORDER — LEVOTHYROXINE SODIUM 50 MCG PO TABS: 50.0000 ug | ORAL_TABLET | Freq: Every day | ORAL | 0 refills | Status: DC

## 2016-11-18 MED ORDER — LISINOPRIL 10 MG PO TABS: 10.0000 mg | ORAL_TABLET | Freq: Every day | ORAL | 0 refills | Status: DC

## 2016-11-18 NOTE — Telephone Encounter (Signed)
Pt would like a refill for lisinopril (PRINIVIL,ZESTRIL) 10 MG tablet sent to [facility] [place].

## 2016-11-18 NOTE — Telephone Encounter (Signed)
Pt also needs refill for levothyroxine (SYNTHROID, LEVOTHROID) 50 MCG tablet.

## 2016-11-18 NOTE — Addendum Note (Signed)
Addended by: Gerda Diss A on: 11/18/2016 09:29 AM   Modules accepted: Orders

## 2016-11-18 NOTE — Telephone Encounter (Signed)
Last OV: 06/24/16 Next OV: 01/02/17  Lab Results  Component Value Date   TSH 3.420 06/21/2015   BMP Latest Ref Rng & Units 08/21/2016 08/14/2016 07/31/2016  Glucose 65 - 99 mg/dL 136(H) 119(H) 115(H)  BUN 6 - 20 mg/dL 27(H) 34(H) 31(H)  Creatinine 0.61 - 1.24 mg/dL 1.47(H) 1.50(H) 1.33(H)  BUN/Creat Ratio 10 - 24 - - -  Sodium 135 - 145 mmol/L 132(L) 131(L) 136  Potassium 3.5 - 5.1 mmol/L 4.3 4.8 4.2  Chloride 101 - 111 mmol/L 100(L) 100(L) 105  CO2 22 - 32 mmol/L '25 24 25  '$ Calcium 8.9 - 10.3 mg/dL 9.2 9.5 9.1

## 2016-11-20 ENCOUNTER — Encounter: Payer: Self-pay | Admitting: Radiation Oncology

## 2016-11-20 ENCOUNTER — Ambulatory Visit
Admission: RE | Admit: 2016-11-20 | Discharge: 2016-11-20 | Disposition: A | Discharge status: Home / Self Care | Payer: Medicare Other | Source: Ambulatory Visit | Attending: Radiation Oncology | Admitting: Radiation Oncology

## 2016-11-20 ENCOUNTER — Other Ambulatory Visit: Payer: Medicare Other

## 2016-11-20 ENCOUNTER — Ambulatory Visit: Payer: Medicare Other | Admitting: Oncology

## 2016-11-20 VITALS — BP 128/79 | HR 90 | Temp 97.3°F | Wt 191.7 lb

## 2016-11-20 DIAGNOSIS — Z87891 Personal history of nicotine dependence: Secondary | ICD-10-CM | POA: Diagnosis not present

## 2016-11-20 DIAGNOSIS — Z923 Personal history of irradiation: Secondary | ICD-10-CM | POA: Diagnosis not present

## 2016-11-20 DIAGNOSIS — C155 Malignant neoplasm of lower third of esophagus: Secondary | ICD-10-CM | POA: Diagnosis not present

## 2016-11-20 NOTE — Progress Notes (Signed)
Radiation Oncology Follow up Note  Name: Corey Perry   Date:   11/20/2016 MRN:  [id number] DOB: 13-Jul-1946    This 71 y.o. male presents to the clinic today for 3 month follow-up status post concurrent chemoradiation for esophageal cancer.  REFERRING PROVIDER: [name], MD  HPI: Patient is a 71 year old male now out 3 months having completed concurrent chemoradiation a neoadjuvant fashion for T3 N0 distal esophageal adenocarcinoma. He underwent resection at [facility]. Showing no lymph node involvement margins negative and incidentally found an 8 mm gastric intestinal stromal tumor in muscularis propria. He is doing well. He's having no pain at this point no specific dysphagia. Feeding tube was discontinued.  COMPLICATIONS OF TREATMENT: none  FOLLOW UP COMPLIANCE: keeps appointments   PHYSICAL EXAM:  BP 128/79   Pulse 90   Temp 97.3 F (36.3 C)   Wt 191 lb 11 oz (87 kg)   BMI 28.31 kg/m  Well-developed well-nourished patient in NAD. HEENT reveals PERLA, EOMI, discs not visualized.  Oral cavity is clear. No oral mucosal lesions are identified. Neck is clear without evidence of cervical or supraclavicular adenopathy. Lungs are clear to A&P. Cardiac examination is essentially unremarkable with regular rate and rhythm without murmur rub or thrill. Abdomen is benign with no organomegaly or masses noted. Motor sensory and DTR levels are equal and symmetric in the upper and lower extremities. Cranial nerves II through XII are grossly intact. Proprioception is intact. No peripheral adenopathy or edema is identified. No motor or sensory levels are noted. Crude visual fields are within normal range.  RADIOLOGY RESULTS: No current films for review  PLAN: Present time patient is doing well status post neoadjuvant chemoradiation followed by esophagectomy. He's having no significant side effects or problems at this time. Pathology report is excellent. I am please was overall progress. I've  asked to see him in 4-5 months for follow-up. Patient is to call with any concerns.  I would like to take this opportunity to thank you for allowing me to participate in the care of your patient.Armstead Peaks., MD

## 2016-11-21 ENCOUNTER — Telehealth: Payer: Self-pay | Admitting: Family Medicine

## 2016-11-21 MED ORDER — CLOPIDOGREL BISULFATE 75 MG PO TABS: 75.0000 mg | ORAL_TABLET | Freq: Every day | ORAL | 1 refills | Status: DC

## 2016-11-21 NOTE — Telephone Encounter (Signed)
Plavix sent to his pharmacy.

## 2016-11-21 NOTE — Telephone Encounter (Signed)
Medication never prescribed by provider in our practice. Our records show pt has been off medication for awhile. Attempted to reach pt. NO answer. VM left for pt to return call.

## 2016-11-21 NOTE — Telephone Encounter (Signed)
Pt needs refill for plavix [facility] to [facility].

## 2016-11-21 NOTE — Telephone Encounter (Signed)
Spoke to pt's wife. She stated that Dr. Jeananne Rama told them they would take over pt's medication management as he had been released from the cardiologist and was on the tail end of having to see his oncologist. Stated that he had been taking Brilinta but it was too expensive so they switched back to Plavix, but he only has 2 pills left. Please advise.

## 2016-11-25 DIAGNOSIS — I2510 Atherosclerotic heart disease of native coronary artery without angina pectoris: Secondary | ICD-10-CM | POA: Diagnosis not present

## 2016-11-25 DIAGNOSIS — E785 Hyperlipidemia, unspecified: Secondary | ICD-10-CM | POA: Diagnosis not present

## 2016-11-25 DIAGNOSIS — I10 Essential (primary) hypertension: Secondary | ICD-10-CM | POA: Diagnosis not present

## 2016-11-27 NOTE — Progress Notes (Signed)
Corey Perry  [phone] Fax:(336) [phone]  ID: [name] OB: Dec 10, 1945  MR#: [id number]  MVE#:[id number]  Patient Care Team: [name], MD as PCP - General (Family Medicine) [name], MD as PCP - Family Medicine (Family Medicine) [name], MD (Specialist) [name], MD as Referring Physician (Internal Medicine) [name], MD as Consulting Physician (Neurosurgery)  CHIEF COMPLAINT: Stage IIb adenocarcinoma of the lower third of the esophagus.  INTERVAL HISTORY: Patient returns to clinic today for further evaluation and postoperative follow-up. He completed his esophagectomy without significant complications. He currently feels well and is nearly back to his baseline.  His swallowing is much improved and he reports being able to tolerate solid foods. He denies any recent fevers or illnesses. He has no neurologic complaints. He denies any chest pain or shortness of breath. He denies any nausea, vomiting, constipation, or diarrhea. He reports nocturia 2-3 times per night which is chronic, no other urinary complaints. Patient offers no further specific complaints today.  REVIEW OF SYSTEMS:   Review of Systems  Constitutional: Negative for fever, malaise/fatigue and weight loss.  HENT: Negative.   Respiratory: Negative.  Negative for cough and shortness of breath.   Cardiovascular: Negative.  Negative for chest pain and leg swelling.  Gastrointestinal: Negative for abdominal pain, blood in stool, constipation, diarrhea, melena, nausea and vomiting.  Genitourinary: Negative for frequency and urgency.       Nocturia  Musculoskeletal: Negative.   Neurological: Negative.  Negative for tingling, weakness and headaches.  Psychiatric/Behavioral: The patient does not have insomnia.     As per HPI. Otherwise, a complete review of systems is negative.  PAST MEDICAL HISTORY: Past Medical History:  Diagnosis Date  . Abnormal white  blood cell (WBC) count    seeing Dr. [name] at [place] on 10/25  . BPH (benign prostatic hypertrophy)   . Bradycardia   . CAD (coronary artery disease)   . Chronic kidney disease   . Dysrhythmia    bradycardia - sees Dr. Humphrey Rolls  . Esophageal cancer ([place]) 05/2016  . GERD (gastroesophageal reflux disease)   . Hyperlipidemia   . Hypertension   . Hypothyroidism   . Sleep apnea    mild - SS - Care everywhere -04/18/07  . Spinal stenosis    lumbar    PAST SURGICAL HISTORY: Past Surgical History:  Procedure Laterality Date  . BACK SURGERY    . CARDIAC CATHETERIZATION Left 03/12/2016   Procedure: Left Heart Cath and Coronary Angiography;  Surgeon: [name], MD;  Location: [facility];  Service: Cardiovascular;  Laterality: Left;  . CARDIAC CATHETERIZATION N/A 03/12/2016   Procedure: Coronary Stent Intervention;  Surgeon: Yolonda Kida, MD;  Location: [facility];  Service: Cardiovascular;  Laterality: N/A;  . COLONOSCOPY WITH PROPOFOL N/A 07/13/2015   Procedure: COLONOSCOPY WITH PROPOFOL;  Surgeon: Lucilla Lame, MD;  Location: [facility];  Service: Endoscopy;  Laterality: N/A;  . CORONARY ANGIOPLASTY  10/05 and 12/05   4 DES placed  . ESOPHAGOGASTRODUODENOSCOPY (EGD) WITH PROPOFOL N/A 06/14/2016   Procedure: ESOPHAGOGASTRODUODENOSCOPY (EGD) WITH PROPOFOL;  Surgeon: [name], MD;  Location: [facility];  Service: Endoscopy;  Laterality: N/A;  . HERNIA REPAIR     x3  . POLYPECTOMY  07/13/2015   Procedure: POLYPECTOMY;  Surgeon: Lucilla Lame, MD;  Location: [facility];  Service: Endoscopy;;  . PORTACATH PLACEMENT N/A 07/08/2016   Procedure: INSERTION PORT-A-CATH;  Surgeon: Olean Ree, MD;  Location: [facility];  Service: General;  Laterality: N/A;    FAMILY HISTORY Family History  Problem Relation Age of Onset  . Hypertension Mother   . Heart disease Father   . Hypertension Father   . Kidney disease Father   . Hypertension  Brother   . Diabetes Son        ADVANCED DIRECTIVES:    HEALTH MAINTENANCE: Social History  Substance Use Topics  . Smoking status: Former Smoker    Packs/day: 1.00    Years: 40.00    Types: Cigarettes    Quit date: 07/16/2004  . Smokeless tobacco: Former Systems developer    Types: Chew  . Alcohol use No     Colonoscopy:  PAP:  Bone density:  Lipid panel:  No Known Allergies  Current Outpatient Prescriptions  Medication Sig Dispense Refill  . aspirin 81 MG tablet Take 81 mg by mouth daily. AM    . clopidogrel (PLAVIX) 75 MG tablet Take 1 tablet (75 mg total) by mouth daily. 30 tablet 1  . dexamethasone (DECADRON) 4 MG tablet Take 1 tablet (4 mg total) by mouth 2 (two) times daily with a meal. 30 tablet 0  . docusate (COLACE) 60 MG/15ML syrup Take 15 mLs (60 mg total) by mouth daily. 200 mL 0  . isosorbide mononitrate (IMDUR) 30 MG 24 hr tablet 30 mg daily. AM  5  . levothyroxine (SYNTHROID, LEVOTHROID) 50 MCG tablet Take 1 tablet (50 mcg total) by mouth daily. 90 tablet 0  . lisinopril (PRINIVIL,ZESTRIL) 10 MG tablet Take 1 tablet (10 mg total) by mouth daily. 90 tablet 0  . multivitamin-iron-minerals-folic acid (CENTRUM) chewable tablet Chew 1 tablet by mouth daily.    . nitroGLYCERIN (NITROSTAT) 0.4 MG SL tablet Place 1 tablet under the tongue every 5 (five) minutes as needed.  98  . ondansetron (ZOFRAN) 8 MG tablet Take 1 tablet (8 mg total) by mouth 2 (two) times daily as needed for refractory nausea / vomiting. 30 tablet 1  . oxyCODONE-acetaminophen (PERCOCET/ROXICET) 5-325 MG tablet Take 1 tablet by mouth every 6 (six) hours as needed for severe pain. 60 tablet 0  . pantoprazole (PROTONIX) 20 MG tablet Take 1 tablet (20 mg total) by mouth daily. 90 tablet 4  . prochlorperazine (COMPAZINE) 10 MG tablet Take 1 tablet (10 mg total) by mouth every 6 (six) hours as needed for nausea or vomiting. 30 tablet 0  . simvastatin (ZOCOR) 40 MG tablet Take 1 tablet (40 mg total) by mouth  daily. 90 tablet 4   No current facility-administered medications for this visit.    Facility-Administered Medications Ordered in Other Visits  Medication Dose Route Frequency Provider Last Rate Last Dose  . sodium chloride flush (NS) 0.9 % injection 10 mL  10 mL Intracatheter PRN [name], MD   10 mL at 08/14/16 0930    OBJECTIVE: Vitals:   11/28/16 1440  BP: 113/71  Pulse: 85  Resp: 18  Temp: 98.2 F (36.8 C)     Body mass index is 28.18 kg/m.    ECOG FS:0 - Asymptomatic  General: Well-developed, well-nourished, no acute distress. Eyes: Pink conjunctiva, anicteric sclera. Lungs: Clear to auscultation bilaterally. Heart: Regular rate and rhythm. No rubs, murmurs, or gallops. Abdomen: Soft, nontender, nondistended. No organomegaly noted, normoactive bowel sounds. Musculoskeletal: No edema, cyanosis, or clubbing. Neuro: Alert, answering all questions appropriately. Cranial nerves grossly intact. Skin: No rashes or petechiae noted. Psych: Normal affect.   LAB RESULTS:  Lab Results  Component  Value Date   NA 136 11/28/2016   K 3.4 (L) 11/28/2016   CL 105 11/28/2016   CO2 26 11/28/2016   GLUCOSE 103 (H) 11/28/2016   BUN 14 11/28/2016   CREATININE 1.23 11/28/2016   CALCIUM 9.0 11/28/2016   PROT 7.2 11/28/2016   ALBUMIN 4.1 11/28/2016   AST 23 11/28/2016   ALT 21 11/28/2016   ALKPHOS 57 11/28/2016   BILITOT 0.6 11/28/2016   GFRNONAA 58 (L) 11/28/2016   GFRAA >60 11/28/2016    Lab Results  Component Value Date   WBC 4.0 11/28/2016   NEUTROABS 3.0 11/28/2016   HGB 10.5 (L) 11/28/2016   HCT 31.1 (L) 11/28/2016   MCV 100.3 (H) 11/28/2016   PLT 171 11/28/2016     STUDIES: No results found.  ASSESSMENT: Pathologic Stage IIb (T3, N0, M0) adenocarcinoma of the lower third of the esophagus.  PLAN:    1. Stage IIb adenocarcinoma of the lower third of the esophagus: Patient had EUS completed at [facility] confirming the stage and diagnosis. He  completed 6 cycles of weekly carboplatinum and Taxol completed on August 21, 2016 and daily XRT completed on August 27, 2016. Patient had his esophagectomy on October 07, 2016. He has imaging scheduled at [facility] in approximately 2 months. Patient will then follow-up 1-2 days later for further evaluation.  2. Thrombocytopenia: Most recent platelet count was within normal limits. Previously entire workup did not reveal a distinct etiology. No bone marrow biopsy has been performed to this point. Continue to monitor closely.  4. Renal insufficiency: Patient's creatinine is now within normal limits, monitor.  5. Nocturia: Consider urology referral if symptoms continue to be disruptive.  6. Port: Have sent a referral to surgery for port removal.  Patient expressed understanding and was in agreement with this plan. He also understands that He can call clinic at any time with any questions, concerns, or complaints.    [name], MD 12/01/16 10:33 AM

## 2016-11-28 ENCOUNTER — Inpatient Hospital Stay: Payer: Medicare Other | Attending: Oncology | Admitting: Oncology

## 2016-11-28 ENCOUNTER — Inpatient Hospital Stay: Payer: Medicare Other

## 2016-11-28 VITALS — BP 113/71 | HR 85 | Temp 98.2°F | Resp 18 | Wt 190.8 lb

## 2016-11-28 DIAGNOSIS — C155 Malignant neoplasm of lower third of esophagus: Secondary | ICD-10-CM

## 2016-11-28 DIAGNOSIS — D696 Thrombocytopenia, unspecified: Secondary | ICD-10-CM

## 2016-11-28 DIAGNOSIS — E039 Hypothyroidism, unspecified: Secondary | ICD-10-CM | POA: Insufficient documentation

## 2016-11-28 DIAGNOSIS — Z79899 Other long term (current) drug therapy: Secondary | ICD-10-CM

## 2016-11-28 DIAGNOSIS — N400 Enlarged prostate without lower urinary tract symptoms: Secondary | ICD-10-CM | POA: Diagnosis not present

## 2016-11-28 DIAGNOSIS — G4730 Sleep apnea, unspecified: Secondary | ICD-10-CM | POA: Insufficient documentation

## 2016-11-28 DIAGNOSIS — K219 Gastro-esophageal reflux disease without esophagitis: Secondary | ICD-10-CM | POA: Diagnosis not present

## 2016-11-28 DIAGNOSIS — I2510 Atherosclerotic heart disease of native coronary artery without angina pectoris: Secondary | ICD-10-CM | POA: Insufficient documentation

## 2016-11-28 DIAGNOSIS — M48061 Spinal stenosis, lumbar region without neurogenic claudication: Secondary | ICD-10-CM

## 2016-11-28 DIAGNOSIS — Z87891 Personal history of nicotine dependence: Secondary | ICD-10-CM | POA: Diagnosis not present

## 2016-11-28 DIAGNOSIS — Z7982 Long term (current) use of aspirin: Secondary | ICD-10-CM | POA: Diagnosis not present

## 2016-11-28 DIAGNOSIS — N189 Chronic kidney disease, unspecified: Secondary | ICD-10-CM

## 2016-11-28 DIAGNOSIS — E785 Hyperlipidemia, unspecified: Secondary | ICD-10-CM | POA: Insufficient documentation

## 2016-11-28 DIAGNOSIS — R351 Nocturia: Secondary | ICD-10-CM | POA: Diagnosis not present

## 2016-11-28 DIAGNOSIS — I129 Hypertensive chronic kidney disease with stage 1 through stage 4 chronic kidney disease, or unspecified chronic kidney disease: Secondary | ICD-10-CM | POA: Diagnosis not present

## 2016-11-28 DIAGNOSIS — R001 Bradycardia, unspecified: Secondary | ICD-10-CM

## 2016-11-28 DIAGNOSIS — Z95828 Presence of other vascular implants and grafts: Secondary | ICD-10-CM

## 2016-11-28 LAB — CBC WITH DIFFERENTIAL/PLATELET
Basophils Absolute: 0 10*3/uL (ref 0–0.1)
Basophils Relative: 1 %
EOS PCT: 2 %
Eosinophils Absolute: 0.1 10*3/uL (ref 0–0.7)
HEMATOCRIT: 31.1 % — AB (ref 40.0–52.0)
Hemoglobin: 10.5 g/dL — ABNORMAL LOW (ref 13.0–18.0)
LYMPHS ABS: 0.5 10*3/uL — AB (ref 1.0–3.6)
LYMPHS PCT: 12 %
MCH: 33.8 pg (ref 26.0–34.0)
MCHC: 33.7 g/dL (ref 32.0–36.0)
MCV: 100.3 fL — AB (ref 80.0–100.0)
MONO ABS: 0.4 10*3/uL (ref 0.2–1.0)
Monocytes Relative: 10 %
Neutro Abs: 3 10*3/uL (ref 1.4–6.5)
Neutrophils Relative %: 75 %
PLATELETS: 171 10*3/uL (ref 150–440)
RBC: 3.1 MIL/uL — AB (ref 4.40–5.90)
RDW: 17.4 % — ABNORMAL HIGH (ref 11.5–14.5)
WBC: 4 10*3/uL (ref 3.8–10.6)

## 2016-11-28 LAB — COMPREHENSIVE METABOLIC PANEL
ALT: 21 U/L (ref 17–63)
AST: 23 U/L (ref 15–41)
Albumin: 4.1 g/dL (ref 3.5–5.0)
Alkaline Phosphatase: 57 U/L (ref 38–126)
Anion gap: 5 (ref 5–15)
BILIRUBIN TOTAL: 0.6 mg/dL (ref 0.3–1.2)
BUN: 14 mg/dL (ref 6–20)
CHLORIDE: 105 mmol/L (ref 101–111)
CO2: 26 mmol/L (ref 22–32)
CREATININE: 1.23 mg/dL (ref 0.61–1.24)
Calcium: 9 mg/dL (ref 8.9–10.3)
GFR calc Af Amer: >60 mL/min (ref >60)
GFR, EST NON AFRICAN AMERICAN: 58 mL/min — AB (ref >60)
Glucose, Bld: 103 mg/dL — ABNORMAL HIGH (ref 65–99)
Potassium: 3.4 mmol/L — ABNORMAL LOW (ref 3.5–5.1)
Sodium: 136 mmol/L (ref 135–145)
TOTAL PROTEIN: 7.2 g/dL (ref 6.5–8.1)

## 2016-11-28 MED ORDER — SODIUM CHLORIDE 0.9% FLUSH
10.0000 mL | INTRAVENOUS | Status: DC | PRN
  Administered 2016-11-28: 10 mL via INTRAVENOUS
  Filled 2016-11-28: qty 10

## 2016-11-28 MED ORDER — HEPARIN SOD (PORK) LOCK FLUSH 100 UNIT/ML IV SOLN
500.0000 [IU] | Freq: Once | INTRAVENOUS | Status: AC
  Administered 2016-11-28: 500 [IU] via INTRAVENOUS

## 2016-11-28 NOTE — Progress Notes (Signed)
Patient here today for follow up.   

## 2016-12-21 ENCOUNTER — Emergency Department: Payer: Medicare Other

## 2016-12-21 ENCOUNTER — Emergency Department
Admission: EM | Admit: 2016-12-21 | Discharge: 2016-12-21 | Disposition: A | Discharge status: Home / Self Care | Payer: Medicare Other | Attending: Emergency Medicine | Admitting: Emergency Medicine

## 2016-12-21 ENCOUNTER — Encounter: Payer: Self-pay | Admitting: Radiology

## 2016-12-21 DIAGNOSIS — I2510 Atherosclerotic heart disease of native coronary artery without angina pectoris: Secondary | ICD-10-CM | POA: Insufficient documentation

## 2016-12-21 DIAGNOSIS — Z79899 Other long term (current) drug therapy: Secondary | ICD-10-CM | POA: Insufficient documentation

## 2016-12-21 DIAGNOSIS — N183 Chronic kidney disease, stage 3 (moderate): Secondary | ICD-10-CM | POA: Insufficient documentation

## 2016-12-21 DIAGNOSIS — I129 Hypertensive chronic kidney disease with stage 1 through stage 4 chronic kidney disease, or unspecified chronic kidney disease: Secondary | ICD-10-CM | POA: Insufficient documentation

## 2016-12-21 DIAGNOSIS — Z87891 Personal history of nicotine dependence: Secondary | ICD-10-CM | POA: Diagnosis not present

## 2016-12-21 DIAGNOSIS — Z7982 Long term (current) use of aspirin: Secondary | ICD-10-CM | POA: Diagnosis not present

## 2016-12-21 DIAGNOSIS — E039 Hypothyroidism, unspecified: Secondary | ICD-10-CM | POA: Insufficient documentation

## 2016-12-21 DIAGNOSIS — R1030 Lower abdominal pain, unspecified: Secondary | ICD-10-CM | POA: Diagnosis present

## 2016-12-21 DIAGNOSIS — N200 Calculus of kidney: Secondary | ICD-10-CM | POA: Diagnosis not present

## 2016-12-21 DIAGNOSIS — R109 Unspecified abdominal pain: Secondary | ICD-10-CM | POA: Diagnosis not present

## 2016-12-21 LAB — CBC
HCT: 35.1 % — ABNORMAL LOW (ref 40.0–52.0)
Hemoglobin: 11.6 g/dL — ABNORMAL LOW (ref 13.0–18.0)
MCH: 32.6 pg (ref 26.0–34.0)
MCHC: 33.1 g/dL (ref 32.0–36.0)
MCV: 98.4 fL (ref 80.0–100.0)
PLATELETS: 153 10*3/uL (ref 150–440)
RBC: 3.57 MIL/uL — ABNORMAL LOW (ref 4.40–5.90)
RDW: 16.6 % — AB (ref 11.5–14.5)
WBC: 4.8 10*3/uL (ref 3.8–10.6)

## 2016-12-21 LAB — COMPREHENSIVE METABOLIC PANEL
ALT: 15 U/L — ABNORMAL LOW (ref 17–63)
AST: 26 U/L (ref 15–41)
Albumin: 4.1 g/dL (ref 3.5–5.0)
Alkaline Phosphatase: 49 U/L (ref 38–126)
Anion gap: 7 (ref 5–15)
BUN: 12 mg/dL (ref 6–20)
CHLORIDE: 105 mmol/L (ref 101–111)
CO2: 25 mmol/L (ref 22–32)
Calcium: 9.2 mg/dL (ref 8.9–10.3)
Creatinine, Ser: 1.36 mg/dL — ABNORMAL HIGH (ref 0.61–1.24)
GFR, EST AFRICAN AMERICAN: 59 mL/min — AB (ref >60)
GFR, EST NON AFRICAN AMERICAN: 51 mL/min — AB (ref >60)
Glucose, Bld: 124 mg/dL — ABNORMAL HIGH (ref 65–99)
POTASSIUM: 3.7 mmol/L (ref 3.5–5.1)
Sodium: 137 mmol/L (ref 135–145)
Total Bilirubin: 0.7 mg/dL (ref 0.3–1.2)
Total Protein: 7.1 g/dL (ref 6.5–8.1)

## 2016-12-21 LAB — URINALYSIS, COMPLETE (UACMP) WITH MICROSCOPIC
Bacteria, UA: NONE SEEN
Bilirubin Urine: NEGATIVE
Glucose, UA: NEGATIVE mg/dL
Ketones, ur: NEGATIVE mg/dL
LEUKOCYTES UA: NEGATIVE
NITRITE: NEGATIVE
Protein, ur: 30 mg/dL — AB
SPECIFIC GRAVITY, URINE: 1.024 (ref 1.005–1.030)
pH: 5 (ref 5.0–8.0)

## 2016-12-21 LAB — TROPONIN I: Troponin I: <0.03 ng/mL (ref <0.03)

## 2016-12-21 LAB — LIPASE, BLOOD: LIPASE: 16 U/L (ref 11–51)

## 2016-12-21 MED ORDER — MORPHINE SULFATE (PF) 4 MG/ML IV SOLN
INTRAVENOUS | Status: AC
  Filled 2016-12-21: qty 1

## 2016-12-21 MED ORDER — ONDANSETRON HCL 4 MG/2ML IJ SOLN
INTRAMUSCULAR | Status: AC
  Filled 2016-12-21: qty 2

## 2016-12-21 MED ORDER — MORPHINE SULFATE (PF) 4 MG/ML IV SOLN
INTRAVENOUS | Status: AC
  Administered 2016-12-21: 4 mg via INTRAVENOUS
  Filled 2016-12-21: qty 1

## 2016-12-21 MED ORDER — ONDANSETRON HCL 4 MG/2ML IJ SOLN
4.0000 mg | Freq: Once | INTRAMUSCULAR | Status: AC
  Administered 2016-12-21: 4 mg via INTRAVENOUS
  Filled 2016-12-21: qty 2

## 2016-12-21 MED ORDER — TAMSULOSIN HCL 0.4 MG PO CAPS: 0.4000 mg | ORAL_CAPSULE | Freq: Every day | ORAL | 0 refills | Status: DC

## 2016-12-21 MED ORDER — ONDANSETRON HCL 4 MG/2ML IJ SOLN
4.0000 mg | Freq: Once | INTRAMUSCULAR | Status: AC
  Administered 2016-12-21: 4 mg via INTRAVENOUS

## 2016-12-21 MED ORDER — MORPHINE SULFATE (PF) 4 MG/ML IV SOLN
4.0000 mg | Freq: Once | INTRAVENOUS | Status: AC
Start: 2016-12-21 — End: 2016-12-21
  Administered 2016-12-21: 4 mg via INTRAVENOUS

## 2016-12-21 MED ORDER — MORPHINE SULFATE (PF) 4 MG/ML IV SOLN
4.0000 mg | Freq: Once | INTRAVENOUS | Status: AC
  Administered 2016-12-21: 4 mg via INTRAVENOUS

## 2016-12-21 MED ORDER — OXYCODONE HCL 5 MG PO TABS: 5.0000 mg | ORAL_TABLET | Freq: Four times a day (QID) | ORAL | 0 refills | Status: DC | PRN

## 2016-12-21 MED ORDER — MORPHINE SULFATE (PF) 2 MG/ML IV SOLN
2.0000 mg | Freq: Once | INTRAVENOUS | Status: AC
  Administered 2016-12-21: 2 mg via INTRAVENOUS
  Filled 2016-12-21: qty 1

## 2016-12-21 MED ORDER — IOPAMIDOL (ISOVUE-300) INJECTION 61%
30.0000 mL | Freq: Once | INTRAVENOUS | Status: AC | PRN
Start: 2016-12-21 — End: 2016-12-21
  Administered 2016-12-21: 30 mL via ORAL

## 2016-12-21 MED ORDER — MORPHINE SULFATE (PF) 4 MG/ML IV SOLN
4.0000 mg | Freq: Once | INTRAVENOUS | Status: AC
  Administered 2016-12-21: 4 mg via INTRAVENOUS
  Filled 2016-12-21: qty 1

## 2016-12-21 MED ORDER — ONDANSETRON 4 MG PO TBDP: 4.0000 mg | ORAL_TABLET | Freq: Four times a day (QID) | ORAL | 0 refills | Status: DC | PRN

## 2016-12-21 MED ORDER — IOPAMIDOL (ISOVUE-300) INJECTION 61%
100.0000 mL | Freq: Once | INTRAVENOUS | Status: AC | PRN
  Administered 2016-12-21: 100 mL via INTRAVENOUS

## 2016-12-21 NOTE — ED Notes (Signed)
ED Provider at bedside. 

## 2016-12-21 NOTE — ED Provider Notes (Signed)
[facility] Emergency Department Provider Note   ____________________________________________   First MD Initiated Contact with Patient 12/21/16 1219     (approximate)  I have reviewed the triage vital signs and the nursing notes.   HISTORY  Chief Complaint Abdominal Pain    HPI Corey Perry is a 71 y.o. male here for evaluation of sudden onset pain in his lower abdomen that started immediately after eating breakfast at about 10 AM. He reports a severe pain with unremitting nausea but no vomiting, but frequent retching that started just after eating today. Describes as severe tenderness to 10 hard describe pain in the lower abdomen. He does report having had hernia surgery in his 54s on his groin, and he's noticed that he gets swollen areas in the groin off and on and feels there is swelling in the right groin today but the pain is very hard to describe deeper feeling. He reports the majority of the pain seems very deep in the left pelvis.  No chest pain or trouble breathing. No fevers or chills. She had a slight dry cough for several days but denies productive cough. No numbness or tingling or weakness or cold or blue feet or legs. Denies pain in the back.  Recent history including radiation, chemotherapy and esophageal surgery  Past Medical History:  Diagnosis Date  . Abnormal white blood cell (WBC) count    seeing Dr. [name] at [place] on 10/25  . BPH (benign prostatic hypertrophy)   . Bradycardia   . CAD (coronary artery disease)   . Chronic kidney disease   . Dysrhythmia    bradycardia - sees Dr. Humphrey Rolls  . Esophageal cancer ([place]) 05/2016  . GERD (gastroesophageal reflux disease)   . Hyperlipidemia   . Hypertension   . Hypothyroidism   . Sleep apnea    mild - SS - Care everywhere -04/18/07  . Spinal stenosis    lumbar    Patient Active Problem List   Diagnosis Date Noted  . Port catheter in place   . Esophageal cancer ([place]) 06/23/2016   . Leukopenia 04/10/2016  . Thrombocytopenia ([place]) 04/10/2016  . Unstable angina ([place]) 03/12/2016  . Special screening for malignant neoplasms, colon   . Benign neoplasm of ascending colon   . Rectal polyp   . Hypertensive kidney disease with CKD stage III 06/22/2015  . CAD (coronary artery disease) 06/21/2015  . BPH (benign prostatic hyperplasia) 06/21/2015  . Acid reflux 06/21/2015  . Hypothyroid   . Hyperlipidemia   . Hypertension     Past Surgical History:  Procedure Laterality Date  . BACK SURGERY    . CARDIAC CATHETERIZATION Left 03/12/2016   Procedure: Left Heart Cath and Coronary Angiography;  Surgeon: [name], MD;  Location: [place] [facility];  Service: Cardiovascular;  Laterality: Left;  . CARDIAC CATHETERIZATION N/A 03/12/2016   Procedure: Coronary Stent Intervention;  Surgeon: Yolonda Kida, MD;  Location: [place] [facility];  Service: Cardiovascular;  Laterality: N/A;  . COLONOSCOPY WITH PROPOFOL N/A 07/13/2015   Procedure: COLONOSCOPY WITH PROPOFOL;  Surgeon: Lucilla Lame, MD;  Location: [place];  Service: Endoscopy;  Laterality: N/A;  . CORONARY ANGIOPLASTY  10/05 and 12/05   4 DES placed  . ESOPHAGOGASTRODUODENOSCOPY (EGD) WITH PROPOFOL N/A 06/14/2016   Procedure: ESOPHAGOGASTRODUODENOSCOPY (EGD) WITH PROPOFOL;  Surgeon: [name], MD;  Location: [facility];  Service: Endoscopy;  Laterality: N/A;  . HERNIA REPAIR     x3  .  POLYPECTOMY  07/13/2015   Procedure: POLYPECTOMY;  Surgeon: Lucilla Lame, MD;  Location: [place] [facility];  Service: Endoscopy;;  . PORTACATH PLACEMENT N/A 07/08/2016   Procedure: INSERTION PORT-A-CATH;  Surgeon: Olean Ree, MD;  Location: [facility];  Service: General;  Laterality: N/A;    Prior to Admission medications   Medication Sig Start Date End Date Taking? Authorizing Provider  acetaminophen (TYLENOL) 500 MG tablet Take 1,000 mg by mouth daily. Takes with isosorbide   Yes Historical Provider, MD    aspirin 81 MG tablet Take 81 mg by mouth daily. AM   Yes Historical Provider, MD  clopidogrel (PLAVIX) 75 MG tablet Take 1 tablet (75 mg total) by mouth daily. 11/21/16  Yes Megan P Johnson, DO  isosorbide mononitrate (IMDUR) 30 MG 24 hr tablet 30 mg daily. AM 06/12/15  Yes Historical Provider, MD  levothyroxine (SYNTHROID, LEVOTHROID) 50 MCG tablet Take 1 tablet (50 mcg total) by mouth daily. 11/18/16  Yes Volney American, PA-C  lisinopril (PRINIVIL,ZESTRIL) 10 MG tablet Take 1 tablet (10 mg total) by mouth daily. 11/18/16  Yes Volney American, PA-C  multivitamin-iron-minerals-folic acid (CENTRUM) chewable tablet Chew 1 tablet by mouth daily.   Yes Historical Provider, MD  nitroGLYCERIN (NITROSTAT) 0.4 MG SL tablet Place 1 tablet under the tongue every 5 (five) minutes as needed. 03/08/16  Yes Historical Provider, MD  ondansetron (ZOFRAN) 8 MG tablet Take 1 tablet (8 mg total) by mouth 2 (two) times daily as needed for refractory nausea / vomiting. 07/05/16  Yes [name], MD  pantoprazole (PROTONIX) 20 MG tablet Take 1 tablet (20 mg total) by mouth daily. 06/24/16  Yes [name], MD  prochlorperazine (COMPAZINE) 10 MG tablet Take 1 tablet (10 mg total) by mouth every 6 (six) hours as needed for nausea or vomiting. 07/18/16  Yes [name], MD  simvastatin (ZOCOR) 40 MG tablet Take 1 tablet (40 mg total) by mouth daily. 06/24/16  Yes [name], MD  docusate (COLACE) 60 MG/15ML syrup Take 15 mLs (60 mg total) by mouth daily. Patient not taking: Reported on 12/21/2016 06/22/16   Sable Feil, PA-C  ondansetron (ZOFRAN ODT) 4 MG disintegrating tablet Take 1 tablet (4 mg total) by mouth every 6 (six) hours as needed for nausea or vomiting. 12/21/16   Delman Kitten, MD  oxyCODONE (OXY IR/ROXICODONE) 5 MG immediate release tablet Take 1 tablet (5 mg total) by mouth every 6 (six) hours as needed for severe pain. 12/21/16   Delman Kitten, MD  oxyCODONE-acetaminophen (PERCOCET/ROXICET)  5-325 MG tablet Take 1 tablet by mouth every 6 (six) hours as needed for severe pain. Patient not taking: Reported on 12/21/2016 08/15/16   Noreene Filbert, MD  tamsulosin (FLOMAX) 0.4 MG CAPS capsule Take 1 capsule (0.4 mg total) by mouth daily. 12/21/16   Delman Kitten, MD    Allergies Patient has no known allergies.  Family History  Problem Relation Age of Onset  . Hypertension Mother   . Heart disease Father   . Hypertension Father   . Kidney disease Father   . Hypertension Brother   . Diabetes Son     Social History Social History  Substance Use Topics  . Smoking status: Former Smoker    Packs/day: 1.00    Years: 40.00    Types: Cigarettes    Quit date: 07/16/2004  . Smokeless tobacco: Former Systems developer    Types: Chew  . Alcohol use No    Review of Systems Constitutional: No  fever/chills Eyes: No visual changes. ENT: No sore throat. Cardiovascular: Denies chest pain. Respiratory: Denies shortness of breath. Gastrointestinal: No diarrhea.  Frequent constipation  Genitourinary: Negative for dysuria. No pain in the buttock or around the rectum. Musculoskeletal: Negative for back pain. Skin: Negative for rash. Neurological: Negative for headaches, focal weakness or numbness.  10-point ROS otherwise negative.  ____________________________________________   PHYSICAL EXAM:  VITAL SIGNS: ED Triage Vitals [12/21/16 1201]  Enc Vitals Group     BP (!) 136/106     Pulse Rate 60     Resp 20     Temp 97.5 F (36.4 C)     Temp Source Oral     SpO2 98 %     Weight 186 lb (84.4 kg)     Height '5\' 9"'$  (1.753 m)     Head Circumference      Peak Flow      Pain Score      Pain Loc      Pain Edu?      Excl. in [place]?     Constitutional: Alert and oriented. Sitting up, actively dry heaving. Appears in severe pain reporting pain across his lower abdomen. Eyes: Conjunctivae are normal. PERRL. EOMI. Head: Atraumatic. Nose: No congestion/rhinnorhea. Mouth/Throat: Mucous membranes are  moist.  Oropharynx non-erythematous. Neck: No stridor.   Cardiovascular: Normal rate, regular rhythm. Grossly normal heart sounds.  Good peripheral circulation. Respiratory: Normal respiratory effort.  No retractions. Lungs CTAB. Gastrointestinal: Soft and nontender throughout the upper abdomen with a clean dry and healed midline epigastric incision.. No distention. He reports moderate tenderness to palpation throughout the lower abdomen more so on the left, . The left inguinal canal is free of any mass or hernia. The right inguinal canal demonstrates a soft probable hernia, however does not appear tender to palpation and there is no significant swelling or overlying erythema. The testicles are nontender and the penis normal bilateral No CVA tenderness. Musculoskeletal: No lower extremity tenderness nor edema.   Neurologic:  Normal speech and language. No gross focal neurologic deficits are appreciated.  Skin:  Skin is warm, dry and intact. No rash noted. Psychiatric: Mood and affect are normal. Speech and behavior are normal.  ____________________________________________   LABS (all labs ordered are listed, but only abnormal results are displayed)  Labs Reviewed  CBC - Abnormal; Notable for the following:       Result Value   RBC 3.57 (*)    Hemoglobin 11.6 (*)    HCT 35.1 (*)    RDW 16.6 (*)    All other components within normal limits  COMPREHENSIVE METABOLIC PANEL - Abnormal; Notable for the following:    Glucose, Bld 124 (*)    Creatinine, Ser 1.36 (*)    ALT 15 (*)    GFR calc non Af Amer 51 (*)    GFR calc Af Amer 59 (*)    All other components within normal limits  URINALYSIS, COMPLETE (UACMP) WITH MICROSCOPIC - Abnormal; Notable for the following:    Color, Urine AMBER (*)    APPearance TURBID (*)    Hgb urine dipstick SMALL (*)    Protein, ur 30 (*)    Squamous Epithelial / LPF 0-5 (*)    All other components within normal limits  URINE CULTURE  LIPASE, BLOOD    TROPONIN I   ____________________________________________  EKG    ____________________________________________  RADIOLOGY  Ct Abdomen Pelvis W Contrast  Result Date: 12/21/2016 CLINICAL DATA:  Severe lower abdominal  pain. History of RIGHT inguinal hernia and esophageal cancer. EXAM: CT ABDOMEN AND PELVIS WITH CONTRAST TECHNIQUE: Multidetector CT imaging of the abdomen and pelvis was performed using the standard protocol following bolus administration of intravenous contrast. CONTRAST:  172m ISOVUE-300 IOPAMIDOL (ISOVUE-300) INJECTION 61% COMPARISON:  CT abdomen and pelvis June 26, 2016 FINDINGS: LOWER CHEST: RIGHT subpleural atelectasis or scarring. Heart size is normal, moderate partially imaged coronary artery calcifications. HEPATOBILIARY: Liver and gallbladder are normal. PANCREAS: Normal. SPLEEN: Normal. ADRENALS/URINARY TRACT: Kidneys are orthotopic, slightly delayed LEFT nephrogram. Punctate LEFT interpolar cortical nephrolithiasis solid renal masses. 13 mm LEFT upper pole cyst. Mild LEFT hydroureteronephrosis, punctate LEFT ureterovesicular junction calculus. The unopacified ureters are normal in course and caliber. Delayed imaging through the kidneys demonstrates symmetric prompt contrast excretion within the proximal urinary collecting system. Urinary bladder is partially distended and unremarkable. Normal adrenal glands. STOMACH/BOWEL: Interval esophagectomy and gastric pull up with dependent debris, incompletely assessed. Severe sigmoid diverticulosis, at a few additional scattered colonic diverticula. The appendix is not discretely identified, however there are no inflammatory changes in the right lower quadrant. VASCULAR/LYMPHATIC: Ectatic and brain aorta to 2.6 cm. Mild calcific atherosclerosis. No lymphadenopathy by CT size criteria. REPRODUCTIVE: Normal. OTHER: Trace free fluid in the pelvis. Mild omental fat stranding subjacent to laparotomy is likely postoperative. No focal fluid  collection. MUSCULOSKELETAL: Nonacute. Anterior abdominal wall scarring. Small fat containing RIGHT inguinal hernia, no bowel. Status post LEFT and possibly RIGHT inguinal herniorrhaphy. Crescentic sclerosis bilateral femoral heads concerning for avascular necrosis without collapse. Moderate sacroiliac osteoarthrosis. Grade 1 L4-5 anterolisthesis, status post laminectomy. IMPRESSION: Punctate LEFT ureterovesicular junction calculus with mild residual obstructive uropathy and renal dysfunction. Small RIGHT fat containing inguinal hernia, no bowel within hernia sac. Interval esophagectomy and gastric pull-up. Electronically Signed   By: CElon AlasM.D.   On: 12/21/2016 16:01   Dg Chest Portable 1 View  Result Date: 12/21/2016 CLINICAL DATA:  71year old male with a history of abdominal pain and known hernia EXAM: PORTABLE CHEST 1 VIEW COMPARISON:  Chest x-ray 07/08/2016 FINDINGS: Cardiomediastinal silhouette unchanged. No evidence of central vascular congestion. No pneumothorax. No pleural effusion. No confluent airspace disease. Similar pattern of interstitial opacities, favored to represent chronic changes. Unchanged left chest wall port catheter via subclavian approach with the tip appearing to terminate superior cavoatrial junction. No displaced fracture. IMPRESSION: No radiographic evidence of acute cardiopulmonary disease. Electronically Signed   By: JCorrie MckusickD.O.   On: 12/21/2016 13:04    ____________________________________________   PROCEDURES  Procedure(s) performed: None  Procedures  Critical Care performed: No  ____________________________________________   INITIAL IMPRESSION / ASSESSMENT AND PLAN / ED COURSE  Pertinent labs & imaging results that were available during my care of the patient were reviewed by me and considered in my medical decision making (see chart for details).  Abdominal pain. Severe, associated nausea and vomiting sudden onset. He reports a  deep-seated left pelvic discomfort. Doesn't hernia present in the right inguinal canal, but is soft reducible and nontender.  Differential diagnosis quite broad given the patient's previous history and recent cancer treatment and surgery. However his symptomatology appears to be primarily located in the lower pelvis. No fevers or infectious symptoms. No chest pain or cardiac symptoms.  ----------------------------------------- 4:39 PM on 12/21/2016 -----------------------------------------  Patient resting comfortably. Reports he feels much better. CT scan demonstrates stone, punctate in the left UVJ which appears to explain his pain and symptoms well. Renal function is preserved. No evidence of infection. Pain well controlled no  further emesis. Patient and wife comfortable with plan for discharge and close follow-up with his primary and urology.  I will prescribe the patient a narcotic pain medicine due to their condition which I anticipate will cause at least moderate pain short term. I discussed with the patient safe use of narcotic pain medicines, and that they are not to drive, work in dangerous areas, or ever take more than prescribed (no more than 1 pill every 6 hours). We discussed that this is the type of medication that can be  overdosed on and the risks of this type of medicine. Patient is very agreeable to only use as prescribed and to never use more than prescribed.  Return precautions and treatment recommendations and follow-up discussed with the patient who is agreeable with the plan.       ____________________________________________   FINAL CLINICAL IMPRESSION(S) / ED DIAGNOSES  Final diagnoses:  Kidney stone on left side      NEW MEDICATIONS STARTED DURING THIS VISIT:  New Prescriptions   ONDANSETRON (ZOFRAN ODT) 4 MG DISINTEGRATING TABLET    Take 1 tablet (4 mg total) by mouth every 6 (six) hours as needed for nausea or vomiting.   OXYCODONE (OXY IR/ROXICODONE) 5  MG IMMEDIATE RELEASE TABLET    Take 1 tablet (5 mg total) by mouth every 6 (six) hours as needed for severe pain.   TAMSULOSIN (FLOMAX) 0.4 MG CAPS CAPSULE    Take 1 capsule (0.4 mg total) by mouth daily.     Note:  This document was prepared using Dragon voice recognition software and may include unintentional dictation errors.     Delman Kitten, MD 12/21/16 [phone]

## 2016-12-21 NOTE — ED Triage Notes (Signed)
Pt states that he is having pain in his lower abd and groin

## 2016-12-21 NOTE — Discharge Instructions (Signed)
You have been seen in the Emergency Department (ED) today for pain that we believe based on your workup, is caused by kidney stones.  As we have discussed, please drink plenty of fluids.  Please make a follow up appointment with the physician(s) listed elsewhere in this documentation.  You may take pain medication as needed but ONLY as prescribed.  Please also take your prescribed Flomax daily.   Take it with meals to minimize stomach discomfort.  Please see your doctor as soon as possible as stones may take 1-3 weeks to pass and you may require additional care or medications.  Do not drink alcohol, drive or participate in any other potentially dangerous activities while taking opiate pain medication as it may make you sleepy. Do not take this medication with any other sedating medications, either prescription or over-the-counter. If you were prescribed Percocet or Vicodin, do not take these with acetaminophen (Tylenol) as it is already contained within these medications.   This medication is an opiate (or narcotic) pain medication and can be habit forming.  Use it as little as possible to achieve adequate pain control.  Do not use or use it with extreme caution if you have a history of opiate abuse or dependence.  If you are on a pain contract with your primary care doctor or a pain specialist, be sure to let them know you were prescribed this medication today from the [facility] Emergency Department.  This medication is intended for your use only - do not give any to anyone else and keep it in a secure place where nobody else, especially children, have access to it.  It will also cause or worsen constipation, so you may want to consider taking an over-the-counter stool softener while you are taking this medication.  Return to the Emergency Department (ED) or call your doctor if you have any worsening pain, fever, painful urination, are unable to urinate, or develop other symptoms that concern  you.

## 2016-12-21 NOTE — ED Notes (Signed)
X-ray at bedside

## 2016-12-23 ENCOUNTER — Ambulatory Visit: Payer: Medicare Other | Admitting: Family Medicine

## 2016-12-23 LAB — URINE CULTURE
CULTURE: NO GROWTH
Special Requests: NORMAL

## 2016-12-25 ENCOUNTER — Ambulatory Visit (INDEPENDENT_AMBULATORY_CARE_PROVIDER_SITE_OTHER): Payer: Medicare Other | Admitting: Urology

## 2016-12-25 ENCOUNTER — Encounter: Payer: Self-pay | Admitting: Urology

## 2016-12-25 VITALS — BP 122/77 | HR 68 | Ht 69.0 in | Wt 183.4 lb

## 2016-12-25 DIAGNOSIS — N132 Hydronephrosis with renal and ureteral calculous obstruction: Secondary | ICD-10-CM | POA: Diagnosis not present

## 2016-12-25 DIAGNOSIS — R3129 Other microscopic hematuria: Secondary | ICD-10-CM

## 2016-12-25 DIAGNOSIS — N200 Calculus of kidney: Secondary | ICD-10-CM | POA: Diagnosis not present

## 2016-12-25 DIAGNOSIS — N201 Calculus of ureter: Secondary | ICD-10-CM

## 2016-12-25 LAB — URINALYSIS, COMPLETE
Bilirubin, UA: NEGATIVE
Glucose, UA: NEGATIVE
Ketones, UA: NEGATIVE
NITRITE UA: NEGATIVE
PH UA: 6 (ref 5.0–7.5)
Specific Gravity, UA: 1.02 (ref 1.005–1.030)
UUROB: 0.2 mg/dL (ref 0.2–1.0)

## 2016-12-25 LAB — MICROSCOPIC EXAMINATION

## 2016-12-25 NOTE — Progress Notes (Signed)
12/25/2016 10:55 AM   Corey Perry 1946/07/12 [id number]  Referring provider: [name], MD [address] [place], [place] [address]  No chief complaint on file.   HPI: Consultation for ureteral stone. Patient developed left lower abdominal pain about a week ago. CT scan of the abdomen and pelvis was done which revealed left hydronephrosis, less dilation of the ureter with a punctate stone at the left UVJ. There were no other stones. The stone was not readily visible on the axial images but was visible more on the coronal. His UA showed 6-30 white cells but no red cells. Culture was negative. White count was 4.8, BUN 12, creatinine 1.36. His pain resolved. He saw some "dark slivers" in the screen. He had esophageal cancer with chemo/radiation then esophagectomy Jan 2018. UA today with 30 rbc's / hpf.   He's on flomax. No prior stones.   PMH: Past Medical History:  Diagnosis Date  . Abnormal white blood cell (WBC) count    seeing Dr. [name] at [place] on 10/25  . BPH (benign prostatic hypertrophy)   . Bradycardia   . CAD (coronary artery disease)   . Chronic kidney disease   . Dysrhythmia    bradycardia - sees Dr. Humphrey Rolls  . Esophageal cancer ([place]) 05/2016  . GERD (gastroesophageal reflux disease)   . Hyperlipidemia   . Hypertension   . Hypothyroidism   . Sleep apnea    mild - SS - Care everywhere -04/18/07  . Spinal stenosis    lumbar    Surgical History: Past Surgical History:  Procedure Laterality Date  . BACK SURGERY    . CARDIAC CATHETERIZATION Left 03/12/2016   Procedure: Left Heart Cath and Coronary Angiography;  Surgeon: [name], MD;  Location: [facility];  Service: Cardiovascular;  Laterality: Left;  . CARDIAC CATHETERIZATION N/A 03/12/2016   Procedure: Coronary Stent Intervention;  Surgeon: Yolonda Kida, MD;  Location: [facility];  Service: Cardiovascular;  Laterality: N/A;  . COLONOSCOPY WITH PROPOFOL N/A 07/13/2015   Procedure: COLONOSCOPY WITH PROPOFOL;  Surgeon: Lucilla Lame, MD;  Location: [facility];  Service: Endoscopy;  Laterality: N/A;  . CORONARY ANGIOPLASTY  10/05 and 12/05   4 DES placed  . ESOPHAGOGASTRODUODENOSCOPY (EGD) WITH PROPOFOL N/A 06/14/2016   Procedure: ESOPHAGOGASTRODUODENOSCOPY (EGD) WITH PROPOFOL;  Surgeon: [name], MD;  Location[facility];  Service: Endoscopy;  Laterality: N/A;  . HERNIA REPAIR     x3  . POLYPECTOMY  07/13/2015   Procedure: POLYPECTOMY;  Surgeon: Lucilla Lame, MD;  Location: [facility];  Service: Endoscopy;;  . PORTACATH PLACEMENT N/A 07/08/2016   Procedure: INSERTION PORT-A-CATH;  Surgeon: Olean Ree, MD;  Location: [facility];  Service: General;  Laterality: N/A;    Home Medications:  Allergies as of 12/25/2016   No Known Allergies     Medication List       Accurate as of 12/25/16 10:55 AM. Always use your most recent med list.          acetaminophen 500 MG tablet Commonly known as:  TYLENOL Take 1,000 mg by mouth daily. Takes with isosorbide   aspirin 81 MG tablet Take 81 mg by mouth daily. AM   clopidogrel 75 MG tablet Commonly known as:  PLAVIX Take 1 tablet (75 mg total) by mouth daily.   docusate 60 MG/15ML syrup Commonly known as:  COLACE Take 15 mLs (60 mg total) by mouth daily.   isosorbide mononitrate 30 MG  24 hr tablet Commonly known as:  IMDUR 30 mg daily. AM   levothyroxine 50 MCG tablet Commonly known as:  SYNTHROID, LEVOTHROID Take 1 tablet (50 mcg total) by mouth daily.   lisinopril 10 MG tablet Commonly known as:  PRINIVIL,ZESTRIL Take 1 tablet (10 mg total) by mouth daily.   multivitamin-iron-minerals-folic acid chewable tablet Chew 1 tablet by mouth daily.   nitroGLYCERIN 0.4 MG SL tablet Commonly known as:  NITROSTAT Place 1 tablet under the tongue every 5 (five) minutes as needed.   ondansetron 4 MG disintegrating tablet Commonly known as:  ZOFRAN ODT Take 1 tablet (4 mg  total) by mouth every 6 (six) hours as needed for nausea or vomiting.   ondansetron 8 MG tablet Commonly known as:  ZOFRAN Take 1 tablet (8 mg total) by mouth 2 (two) times daily as needed for refractory nausea / vomiting.   oxyCODONE 5 MG immediate release tablet Commonly known as:  Oxy IR/ROXICODONE Take 1 tablet (5 mg total) by mouth every 6 (six) hours as needed for severe pain.   oxyCODONE-acetaminophen 5-325 MG tablet Commonly known as:  PERCOCET/ROXICET Take 1 tablet by mouth every 6 (six) hours as needed for severe pain.   pantoprazole 20 MG tablet Commonly known as:  PROTONIX Take 1 tablet (20 mg total) by mouth daily.   prochlorperazine 10 MG tablet Commonly known as:  COMPAZINE Take 1 tablet (10 mg total) by mouth every 6 (six) hours as needed for nausea or vomiting.   simvastatin 40 MG tablet Commonly known as:  ZOCOR Take 1 tablet (40 mg total) by mouth daily.   tamsulosin 0.4 MG Caps capsule Commonly known as:  FLOMAX Take 1 capsule (0.4 mg total) by mouth daily.       Allergies: No Known Allergies  Family History: Family History  Problem Relation Age of Onset  . Hypertension Mother   . Heart disease Father   . Hypertension Father   . Kidney disease Father   . Hypertension Brother   . Diabetes Son     Social History:  reports that he quit smoking about 12 years ago. His smoking use included Cigarettes. He has a 40.00 pack-year smoking history. He has quit using smokeless tobacco. His smokeless tobacco use included Chew. He reports that he does not drink alcohol or use drugs.  ROS:                                        Physical Exam: There were no vitals taken for this visit.  Constitutional:  Alert and oriented, No acute distress. HEENT: [place] AT, moist mucus membranes.  Trachea midline, no masses. Cardiovascular: No clubbing, cyanosis, or edema. Respiratory: Normal respiratory effort, no increased work of breathing. GI:  Abdomen is soft, nontender, nondistended, no abdominal masses GU: No CVA tenderness. Skin: No rashes, bruises or suspicious lesions. Lymph: No cervical or inguinal adenopathy. Neurologic: Grossly intact, no focal deficits, moving all 4 extremities. Psychiatric: Normal mood and affect.  Laboratory Data: Lab Results  Component Value Date   WBC 4.8 12/21/2016   HGB 11.6 (L) 12/21/2016   HCT 35.1 (L) 12/21/2016   MCV 98.4 12/21/2016   PLT 153 12/21/2016    Lab Results  Component Value Date   CREATININE 1.36 (H) 12/21/2016    No results found for: PSA  No results found for: TESTOSTERONE  No results found for: HGBA1C  Urinalysis    Component Value Date/Time   COLORURINE AMBER (A) 12/21/2016 1230   APPEARANCEUR TURBID (A) 12/21/2016 1230   APPEARANCEUR Clear 06/21/2015 1132   LABSPEC 1.024 12/21/2016 1230   PHURINE 5.0 12/21/2016 1230   GLUCOSEU NEGATIVE 12/21/2016 1230   HGBUR SMALL (A) 12/21/2016 1230   BILIRUBINUR NEGATIVE 12/21/2016 1230   BILIRUBINUR Negative 06/21/2015 [place] 12/21/2016 1230   PROTEINUR 30 (A) 12/21/2016 1230   NITRITE NEGATIVE 12/21/2016 1230   LEUKOCYTESUR NEGATIVE 12/21/2016 1230   LEUKOCYTESUR Negative 06/21/2015 1132    Pertinent Imaging: CT scan   Assessment & Plan:    1- left ureteral stone -  Likely passed, but UA with > 30 rbc's. Discussed dietary changes to prevent stones.    2. Left hydronephrosis - set up a renal u/s in 2-3 weeks to ensure resolution of hydro.   3. MH - likely from stone but discussed importance of f/u.   There are no diagnoses linked to this encounter.  No Follow-up on file.  Festus Aloe, [facility] [address] [place], [place] [address] [phone]

## 2016-12-27 ENCOUNTER — Telehealth: Payer: Self-pay

## 2016-12-27 NOTE — Telephone Encounter (Signed)
Received Port Removal orders from Dr. [name]. Please call patient and schedule to see with Dr. Hampton Abbot to discuss port removal for next available appointment please.

## 2016-12-30 NOTE — Telephone Encounter (Signed)
Scheduled on 01/09/17 to see Dr. Hampton Abbot

## 2017-01-02 ENCOUNTER — Ambulatory Visit: Payer: Self-pay | Admitting: Family Medicine

## 2017-01-02 ENCOUNTER — Ambulatory Visit
Admission: RE | Admit: 2017-01-02 | Discharge: 2017-01-02 | Disposition: A | Discharge status: Home / Self Care | Payer: Medicare Other | Source: Ambulatory Visit | Attending: Urology | Admitting: Urology

## 2017-01-02 DIAGNOSIS — R3129 Other microscopic hematuria: Secondary | ICD-10-CM | POA: Diagnosis not present

## 2017-01-02 DIAGNOSIS — N132 Hydronephrosis with renal and ureteral calculous obstruction: Secondary | ICD-10-CM | POA: Diagnosis not present

## 2017-01-09 ENCOUNTER — Ambulatory Visit (INDEPENDENT_AMBULATORY_CARE_PROVIDER_SITE_OTHER): Payer: Medicare Other | Admitting: Surgery

## 2017-01-09 ENCOUNTER — Encounter: Payer: Self-pay | Admitting: Surgery

## 2017-01-09 VITALS — BP 147/76 | HR 53 | Temp 97.6°F | Wt 180.0 lb

## 2017-01-09 DIAGNOSIS — Z08 Encounter for follow-up examination after completed treatment for malignant neoplasm: Secondary | ICD-10-CM | POA: Diagnosis not present

## 2017-01-09 DIAGNOSIS — Z95828 Presence of other vascular implants and grafts: Secondary | ICD-10-CM

## 2017-01-09 DIAGNOSIS — Z8501 Personal history of malignant neoplasm of esophagus: Secondary | ICD-10-CM

## 2017-01-09 NOTE — Progress Notes (Signed)
01/09/2017  History of Present Illness: Corey Perry is a 71 y.o. male with a history of esophageal cancer, s/p portacath placement on 10/23.  He had his chemotherapy and his esophagectomy already and is ready for port to be removed.  He denies any pain from portacath.  Denies any left upper extremity swelling or range of motion difficulties. He is tolerating a diet with good appetite and stable weight.  Feeding tube was removed a couple months ago.   Past Medical History: Past Medical History:  Diagnosis Date  . Abnormal white blood cell (WBC) count    seeing Dr. [name] at [place] on 10/25  . BPH (benign prostatic hypertrophy)   . Bradycardia   . CAD (coronary artery disease)   . Chronic kidney disease   . Dysrhythmia    bradycardia - sees Dr. Humphrey Rolls  . Esophageal cancer ([place]) 05/2016  . GERD (gastroesophageal reflux disease)   . Hyperlipidemia   . Hypertension   . Hypothyroidism   . Sleep apnea    mild - SS - Care everywhere -04/18/07  . Spinal stenosis    lumbar     Past Surgical History: Past Surgical History:  Procedure Laterality Date  . BACK SURGERY    . CARDIAC CATHETERIZATION Left 03/12/2016   Procedure: Left Heart Cath and Coronary Angiography;  Surgeon: [name], MD;  Location: [facility];  Service: Cardiovascular;  Laterality: Left;  . CARDIAC CATHETERIZATION N/A 03/12/2016   Procedure: Coronary Stent Intervention;  Surgeon: Yolonda Kida, MD;  Location: [facility];  Service: Cardiovascular;  Laterality: N/A;  . COLONOSCOPY WITH PROPOFOL N/A 07/13/2015   Procedure: COLONOSCOPY WITH PROPOFOL;  Surgeon: Lucilla Lame, MD;  Location: [facility];  Service: Endoscopy;  Laterality: N/A;  . CORONARY ANGIOPLASTY  10/05 and 12/05   4 DES placed  . ESOPHAGOGASTRODUODENOSCOPY (EGD) WITH PROPOFOL N/A 06/14/2016   Procedure: ESOPHAGOGASTRODUODENOSCOPY (EGD) WITH PROPOFOL;  Surgeon: [name], MD;  Location[facility];   Service: Endoscopy;  Laterality: N/A;  . HERNIA REPAIR     x3  . POLYPECTOMY  07/13/2015   Procedure: POLYPECTOMY;  Surgeon: Lucilla Lame, MD;  Location: [facility];  Service: Endoscopy;;  . PORTACATH PLACEMENT N/A 07/08/2016   Procedure: INSERTION PORT-A-CATH;  Surgeon: Olean Ree, MD;  Location: [facility];  Service: General;  Laterality: N/A;    Home Medications: Prior to Admission medications   Medication Sig Start Date End Date Taking? Authorizing Provider  aspirin 81 MG tablet Take 81 mg by mouth daily. AM   Yes Historical Provider, MD  clopidogrel (PLAVIX) 75 MG tablet Take 1 tablet (75 mg total) by mouth daily. 11/21/16  Yes Megan P Johnson, DO  isosorbide mononitrate (IMDUR) 30 MG 24 hr tablet 30 mg daily. AM 06/12/15  Yes Historical Provider, MD  levothyroxine (SYNTHROID, LEVOTHROID) 50 MCG tablet Take 1 tablet (50 mcg total) by mouth daily. 11/18/16  Yes Volney American, PA-C  lisinopril (PRINIVIL,ZESTRIL) 10 MG tablet Take 1 tablet (10 mg total) by mouth daily. 11/18/16  Yes Volney American, PA-C  multivitamin-iron-minerals-folic acid (CENTRUM) chewable tablet Chew 1 tablet by mouth daily.   Yes Historical Provider, MD  nitroGLYCERIN (NITROSTAT) 0.4 MG SL tablet Place 1 tablet under the tongue every 5 (five) minutes as needed. 03/08/16  Yes Historical Provider, MD  pantoprazole (PROTONIX) 20 MG tablet Take 1 tablet (20 mg total) by mouth daily. 06/24/16  Yes [name], MD  simvastatin (ZOCOR) 40  MG tablet Take 1 tablet (40 mg total) by mouth daily. 06/24/16  Yes [name], MD  tamsulosin (FLOMAX) 0.4 MG CAPS capsule Take 1 capsule (0.4 mg total) by mouth daily. 12/21/16  Yes Delman Kitten, MD    Allergies: No Known Allergies  Review of Systems: Review of Systems  Constitutional: Negative for chills and fever.  Respiratory: Negative for shortness of breath.   Cardiovascular: Negative for chest pain.  Gastrointestinal: Negative for abdominal pain, nausea and  vomiting.  Musculoskeletal: Negative for myalgias.  Skin: Negative for rash.    Physical Exam BP (!) 147/76   Pulse (!) 53   Temp 97.6 F (36.4 C) (Oral)   Wt 81.6 kg (180 lb)   BMI 26.58 kg/m  CONSTITUTIONAL: No acute distress, well nourished and in good spirits. HEENT:  Normocephalic, atraumatic, extraocular motion intact. CHEST:  Left subclavian portacath in place.  Left chest incision is well healed without evidence of infection.  Palpable catheter going underneath the left clavicle. RESPIRATORY:  Lungs are clear, and breath sounds are equal bilaterally. Normal respiratory effort without pathologic use of accessory muscles. CARDIOVASCULAR: Heart is regular without murmurs, gallops, or rubs. GI: The abdomen is soft, nondistended, nontender. PSYCH:  Alert and oriented to person, place and time. Affect is normal.  Labs/Imaging: None  Assessment and Plan: This is a 71 y.o. male w/ hx of esophageal cancer, s/p neoadjuvant chemoradiation and esophagectomy.   --Discussed with patient that procedure of removing the port-a-cath as outpatient procedure and discussed the risks and benefits of the procedure including bleeding, infection.  Informed the patient that he would need to be off his Plavix for 5 days before surgery but that he can continue his Aspirin.  Patient will be scheduled for May 3rd, 2018, in AM.  All questions have been answered.   Corey Perry, Corey Perry

## 2017-01-09 NOTE — Patient Instructions (Signed)
Please look at your blue sheet in case you have any questions about your procedure.  Remember to stop your Plavix five days prior to your procedure. Last dose will be on 01/11/2017.   We will see you back after your procedure to make sure that everything well.

## 2017-01-09 NOTE — Addendum Note (Signed)
Addended by: Riki Sheer on: 01/09/2017 10:20 AM   Modules accepted: Orders, SmartSet

## 2017-01-10 ENCOUNTER — Telehealth: Payer: Self-pay

## 2017-01-10 NOTE — Telephone Encounter (Signed)
Patient has been advised of Surgery Date as well as Pre-Admission appointment date, time, and location.  Surgery Date: 01/16/17  Pre-admit Appointment: 01/13/17 from 1p-5p (Phone)  Patient has been advised to call [phone] the day before surgery between 1-3pm to obtain arrival time.  Patient was told once again to begin holding Plavix on 01/12/17 and continue taking Aspirin throughout surgery period per Dr. Hampton Abbot. Patient and wife verbalize understanding of all information given.

## 2017-01-10 NOTE — Telephone Encounter (Signed)
NO authorization required for CPT code [phone]. Patient has Medicare/BCBS supplement plan F.

## 2017-01-13 ENCOUNTER — Encounter
Admission: RE | Admit: 2017-01-13 | Discharge: 2017-01-13 | Disposition: A | Discharge status: Home / Self Care | Payer: Medicare Other | Source: Ambulatory Visit | Attending: Surgery | Admitting: Surgery

## 2017-01-13 HISTORY — DX: Personal history of urinary calculi: Z87.442

## 2017-01-13 HISTORY — DX: Unspecified atrial fibrillation: I48.91

## 2017-01-13 HISTORY — DX: Acute myocardial infarction, unspecified: I21.9

## 2017-01-13 NOTE — Pre-Procedure Instructions (Signed)
ECG 12-lead1/24/2018 [place] Component Name Value Ref Range  Vent Rate (bpm) 133   QRS Interval (msec) 90   QT Interval (msec) 272   QTc (msec) 404   Result Narrative  Atrial fibrillation Nonspecific T wave abnormalities, Inferolateral leads Prolonged QT Abnormal ECG  When compared with ECG of 24-Sep-2016 14:53, Atrial fibrillation has replaced Normal sinus rhythm Inferior-posterior infarct is now absent I reviewed and concur with this report. Electronically signed [id number], MD, Nathaneil Canary [phone]) on 10/11/2016 1:37:29 AM  Status Results Details    Hospital Encounter on 10/07/2016 [place]")' href="[email or web address]/">Encounter Summary

## 2017-01-13 NOTE — Patient Instructions (Signed)
  Your procedure is scheduled on: 01-16-17 Report to Same Day Surgery 2nd floor medical mall [facility] Entrance-take elevator on left to 2nd floor.  Check in with surgery information desk.) To find out your arrival time please call [phone] between 1PM - 3PM on 01-15-17  Remember: Instructions that are not followed completely may result in serious medical risk, up to and including death, or upon the discretion of your surgeon and anesthesiologist your surgery may need to be rescheduled.    _x___ 1. Do not eat food or drink liquids after midnight. No gum chewing or                              hard candies.     __x__ 2. No Alcohol for 24 hours before or after surgery.   __x__3. No Smoking for 24 prior to surgery.   ____  4. Bring all medications with you on the day of surgery if instructed.    __x__ 5. Notify your doctor if there is any change in your medical condition     (cold, fever, infections).     Do not wear jewelry, make-up, hairpins, clips or nail polish.  Do not wear lotions, powders, or perfumes. You may wear deodorant.  Do not shave 48 hours prior to surgery. Men may shave face and neck.  Do not bring valuables to the hospital.    [facility] is not responsible for any belongings or valuables.               Contacts, dentures or bridgework may not be worn into surgery.  Leave your suitcase in the car. After surgery it may be brought to your room.  For patients admitted to the hospital, discharge time is determined by your                       treatment team.   Patients discharged the day of surgery will not be allowed to drive home.  You will need someone to drive you home and stay with you the night of your procedure.    Please read over the following fact sheets that you were given:     _x___ Take anti-hypertensive (unless it includes a diuretic), cardiac, seizure, asthma,     anti-reflux and psychiatric medicines. These include:  1. ISOSORBIDE  2.  SIMVASTATIN  3.LISINOPRIL  4.PANTOPRAZOLE  5.TAKE AN EXTRA PANTOPRAZOLE AT BEDTIME THE NIGHT BEFORE SURGERY  6.  ____Fleets enema or Magnesium Citrate as directed.   ____ Use CHG Soap or sage wipes as directed on instruction sheet   ____ Use inhalers on the day of surgery and bring to hospital day of surgery  ____ Stop Metformin and Janumet 2 days prior to surgery.    ____ Take 1/2 of usual insulin dose the night before surgery and none on the morning     surgery.   _x___ Follow recommendations from Cardiologist, Pulmonologist or PCP regarding stopping Aspirin, Coumadin, Pllavix ,Eliquis, Effient, or Pradaxa, and Pletal-PT STOPPED PLAVIX 01-12-17 AS INSTRUCTED BY DR PISCOYA- PT TO STILL CONTINUE 81 MG ASA PER DR PISCOYA-DO NOT TAKE AM OF SURGERY  X____Stop Anti-inflammatories such as Advil, Aleve, Ibuprofen, Motrin, Naproxen, Naprosyn, Goodies powders or aspirin products NOW-OK to take Tylenol   ____ Stop supplements until after surgery.     ____ Bring C-Pap to the hospital.

## 2017-01-14 ENCOUNTER — Telehealth: Payer: Self-pay

## 2017-01-14 NOTE — Telephone Encounter (Signed)
Festus Aloe, MD  Lestine Box, LPN        Notify patient renal u/s was normal - no stones or blockage of kidneys seen -- f/u as planned to recheck urine    [facility]

## 2017-01-15 ENCOUNTER — Encounter: Payer: Self-pay | Admitting: *Deleted

## 2017-01-15 NOTE — Pre-Procedure Instructions (Signed)
Corey Perry  Cardiac catheterization  Order# [id number]  Reading physician: Yolonda Kida, MD Ordering physician: Yolonda Kida, MD Study date: 03/12/16  Physicians   Panel Physicians Referring Physician Case Authorizing Physician  Yolonda Kida, MD (Primary)  Yolonda Kida, MD  Procedures   Coronary Stent Intervention  Conclusion    Mid LAD lesion, 95% stenosed. Post intervention, there is a 0% residual stenosis. The lesion was not previously treated.  Successful PCI and stent with DES to mid LAD 3.25 x 15 mm xience 13 atm    Complications   Complications documented in old activity   Patient diagnostic performed by Dr. Humphrey Rolls after careful review films and evaluation the patient I decided to proceed with intervention of the LAD lesion. The patient was brought back to the catheter lab 5 French sheath was replaced for 6 [place]. The patient had XB 35 with outside holes was used for guide support intuision wire was used to traverse the lesion. Angiomax used for anticoagulation he was loaded with Brilinta 180 mg artery gotten 81 mg of aspirin predilatation was performed with a 3.0 X 12 trek. DES 3.25 x 15 mm xience was deployed at 13 atm reducing the lesion from 95 down to 0%. Guide wire were removed final shots were taken sheath was removed and minx deployed. Patient received labetalol at the end of the case to help reduce his blood pressure. Total sedation time was 29 minutes she received 2 mg of Versed and 50 g of fentanyl. 180 mg contrast reveals. Angiomax was continued at reduced rate of 0.25 mg/kg for an additional 2 hours. Patient was transported to telemetry for recovery and case was turned over to Dr. Humphrey Rolls.Estimated blood loss <50 mL. There were no immediate complications during the procedure. Conclusion  Successful PCI and stent of mid LAD lesion with DES 3.25 x 15 mm xience deployed at 13 atm lesion was reduced from 95 down to 0%      Coronary Findings    Dominance: Right  Left Anterior Descending  Mid LAD lesion, 95% stenosed. The lesion is type C Discrete located at the major branch. The lesion was not previously treated. Pressure wire/FFR was not performed on the lesionIVUS was not performed on the lesion.  PCI: The pre-interventional distal flow is normal (TIMI 3). Pre-stent angioplasty was performed. A drug-eluting stent was placed. Minimum lumen area: 3.3 mm. The strut is apposed. Post-stent angioplasty was performed. Lesion length: 12 mm. Maximum pressure: 13 atm. The post-interventional distal flow is normal (TIMI 3). The intervention was successful. No complications occurred at this lesion. Pressure wire/FFR was not performed on the lesion. IVUS was not performed on the lesion. FVR was not performed on the lesion. No optical coherence tomography (OCT) was performed.  There is no residual stenosis post intervention.  Coronary Diagrams   Diagnostic Diagram       Post-Intervention Diagram       Implants     Permanent Stent  Stent Xience Alpine Rx 3.25x15 - [id number] - Implanted    Inventory item: Stent Xience Alpine Rx 3.25x15 Model/Cat number: [id number]  Manufacturer: [employer] Lot number: [id number]  Device identifier: [id number] Device identifier type: GS1  Area Of Implantation: Mid LAD    As of 03/12/2016   Status: Implanted      Vascular Products  Device Closure Mynxgrip 6/53f- [id number] Implanted    Inventory item: Device Closure Mynxgrip 6/736fodel/Cat number: [id number]: [employer] Lot number:  [id number]  Device identifier: [id number] Device identifier type: HIBC  Area Of Implantation: Groin    As of 03/12/2016   Status: Implanted      PACS Images   Show images for Cardiac catheterization   Link to Procedure Log   Procedure Log    Hemo Data   AO Systolic Cath Pressure AO Diastolic Cath Pressure AO Mean Cath Pressure  166 74 mmHg 115 mmHg  167 77 mmHg 110 mmHg  188 85  mmHg 126 mmHg  Order-Level Documents:   There are no order-level documents.  Encounter-Level Documents - 03/12/2016:   Scan on 03/14/2016 1:40 PM by Provider Default, MD  Scan on 03/14/2016 1:13 PM by Provider Default, MD  Scan on 03/14/2016 11:42 AM by Provider Default, MD  Scan on 03/14/2016 11:13 AM by Provider Default, MD  Scan on 03/14/2016 10:51 AM by Provider Default, MD  Scan on 03/14/2016 10:42 AM by Provider Default, MD  Scan on 03/13/2016 9:03 AM by Provider Default, MD  Scan on 03/12/2016 5:04 PM by Provider Default, MD  Scan on 03/12/2016 5:04 PM by Provider Default, MD  Scan on 03/12/2016 4:50 PM by Provider Default, MD  Scan on 03/12/2016 4:49 PM by Provider Default, MD  Scan on 03/12/2016 4:45 PM by Provider Default, MD  Scan on 03/12/2016 11:23 AM by Provider Default, MD  Electronic signature on 03/12/2016 8:51 AM  Electronic signature on 03/12/2016 8:51 AM      Signed   Electronically signed by Yolonda Kida, MD on 03/12/16 at 55 EDT  External Result Report   External Result Report

## 2017-01-15 NOTE — Telephone Encounter (Signed)
LMOM

## 2017-01-16 ENCOUNTER — Ambulatory Visit
Admission: RE | Admit: 2017-01-16 | Discharge: 2017-01-16 | Disposition: A | Discharge status: Home / Self Care | Payer: Medicare Other | Source: Ambulatory Visit | Attending: Surgery | Admitting: Surgery

## 2017-01-16 ENCOUNTER — Ambulatory Visit: Payer: Medicare Other | Admitting: Anesthesiology

## 2017-01-16 ENCOUNTER — Encounter
Admission: RE | Disposition: A | Discharge status: Home / Self Care | Payer: Self-pay | Source: Ambulatory Visit | Attending: Surgery

## 2017-01-16 ENCOUNTER — Encounter: Payer: Self-pay | Admitting: *Deleted

## 2017-01-16 DIAGNOSIS — Z7982 Long term (current) use of aspirin: Secondary | ICD-10-CM | POA: Diagnosis not present

## 2017-01-16 DIAGNOSIS — N400 Enlarged prostate without lower urinary tract symptoms: Secondary | ICD-10-CM | POA: Insufficient documentation

## 2017-01-16 DIAGNOSIS — Z923 Personal history of irradiation: Secondary | ICD-10-CM | POA: Diagnosis not present

## 2017-01-16 DIAGNOSIS — Z8501 Personal history of malignant neoplasm of esophagus: Secondary | ICD-10-CM | POA: Insufficient documentation

## 2017-01-16 DIAGNOSIS — E039 Hypothyroidism, unspecified: Secondary | ICD-10-CM | POA: Insufficient documentation

## 2017-01-16 DIAGNOSIS — I10 Essential (primary) hypertension: Secondary | ICD-10-CM | POA: Diagnosis not present

## 2017-01-16 DIAGNOSIS — K219 Gastro-esophageal reflux disease without esophagitis: Secondary | ICD-10-CM | POA: Insufficient documentation

## 2017-01-16 DIAGNOSIS — Z452 Encounter for adjustment and management of vascular access device: Secondary | ICD-10-CM | POA: Diagnosis not present

## 2017-01-16 DIAGNOSIS — E785 Hyperlipidemia, unspecified: Secondary | ICD-10-CM | POA: Diagnosis not present

## 2017-01-16 DIAGNOSIS — Z9221 Personal history of antineoplastic chemotherapy: Secondary | ICD-10-CM | POA: Diagnosis not present

## 2017-01-16 DIAGNOSIS — I25719 Atherosclerosis of autologous vein coronary artery bypass graft(s) with unspecified angina pectoris: Secondary | ICD-10-CM | POA: Diagnosis not present

## 2017-01-16 DIAGNOSIS — G4730 Sleep apnea, unspecified: Secondary | ICD-10-CM | POA: Insufficient documentation

## 2017-01-16 DIAGNOSIS — Z79899 Other long term (current) drug therapy: Secondary | ICD-10-CM | POA: Insufficient documentation

## 2017-01-16 DIAGNOSIS — Z9889 Other specified postprocedural states: Secondary | ICD-10-CM | POA: Insufficient documentation

## 2017-01-16 DIAGNOSIS — I129 Hypertensive chronic kidney disease with stage 1 through stage 4 chronic kidney disease, or unspecified chronic kidney disease: Secondary | ICD-10-CM | POA: Insufficient documentation

## 2017-01-16 DIAGNOSIS — N189 Chronic kidney disease, unspecified: Secondary | ICD-10-CM | POA: Insufficient documentation

## 2017-01-16 DIAGNOSIS — Z449 Encounter for fitting and adjustment of unspecified external prosthetic device: Secondary | ICD-10-CM | POA: Diagnosis not present

## 2017-01-16 DIAGNOSIS — Z955 Presence of coronary angioplasty implant and graft: Secondary | ICD-10-CM | POA: Insufficient documentation

## 2017-01-16 DIAGNOSIS — I2510 Atherosclerotic heart disease of native coronary artery without angina pectoris: Secondary | ICD-10-CM | POA: Diagnosis not present

## 2017-01-16 DIAGNOSIS — Z95828 Presence of other vascular implants and grafts: Secondary | ICD-10-CM

## 2017-01-16 DIAGNOSIS — Z7902 Long term (current) use of antithrombotics/antiplatelets: Secondary | ICD-10-CM | POA: Insufficient documentation

## 2017-01-16 DIAGNOSIS — Z86010 Personal history of colonic polyps: Secondary | ICD-10-CM | POA: Insufficient documentation

## 2017-01-16 DIAGNOSIS — I480 Paroxysmal atrial fibrillation: Secondary | ICD-10-CM | POA: Diagnosis not present

## 2017-01-16 HISTORY — PX: PORT-A-CATH REMOVAL: SHX5289

## 2017-01-16 SURGERY — REMOVAL PORT-A-CATH
Anesthesia: Monitor Anesthesia Care

## 2017-01-16 MED ORDER — HYDROCODONE-ACETAMINOPHEN 5-325 MG PO TABS
1.0000 | ORAL_TABLET | Freq: Four times a day (QID) | ORAL | 0 refills | Status: DC | PRN
Start: 2017-01-16 — End: 2017-01-22

## 2017-01-16 MED ORDER — ACETAMINOPHEN 500 MG PO TABS
1000.0000 mg | ORAL_TABLET | ORAL | Status: AC
  Administered 2017-01-16: 1000 mg via ORAL

## 2017-01-16 MED ORDER — PROPOFOL 500 MG/50ML IV EMUL
INTRAVENOUS | Status: AC
  Filled 2017-01-16: qty 50

## 2017-01-16 MED ORDER — ACETAMINOPHEN 10 MG/ML IV SOLN
INTRAVENOUS | Status: AC
  Filled 2017-01-16: qty 100

## 2017-01-16 MED ORDER — CEFAZOLIN SODIUM-DEXTROSE 2-4 GM/100ML-% IV SOLN
INTRAVENOUS | Status: AC
  Filled 2017-01-16: qty 100

## 2017-01-16 MED ORDER — OXYCODONE HCL 5 MG/5ML PO SOLN: 5.0000 mg | Freq: Once | ORAL | Status: DC | PRN

## 2017-01-16 MED ORDER — FENTANYL CITRATE (PF) 100 MCG/2ML IJ SOLN
INTRAMUSCULAR | Status: DC | PRN
  Administered 2017-01-16 (×2): 50 ug via INTRAVENOUS

## 2017-01-16 MED ORDER — FENTANYL CITRATE (PF) 100 MCG/2ML IJ SOLN: 25.0000 ug | INTRAMUSCULAR | Status: DC | PRN

## 2017-01-16 MED ORDER — CEFAZOLIN SODIUM-DEXTROSE 2-4 GM/100ML-% IV SOLN
2.0000 g | INTRAVENOUS | Status: AC
  Administered 2017-01-16: 2 g via INTRAVENOUS

## 2017-01-16 MED ORDER — CHLORHEXIDINE GLUCONATE CLOTH 2 % EX PADS
6.0000 | MEDICATED_PAD | Freq: Once | CUTANEOUS | Status: AC
  Administered 2017-01-16: 6 via TOPICAL

## 2017-01-16 MED ORDER — CHLORHEXIDINE GLUCONATE CLOTH 2 % EX PADS: 6.0000 | MEDICATED_PAD | Freq: Once | CUTANEOUS | Status: DC

## 2017-01-16 MED ORDER — FENTANYL CITRATE (PF) 100 MCG/2ML IJ SOLN
INTRAMUSCULAR | Status: AC
  Filled 2017-01-16: qty 2

## 2017-01-16 MED ORDER — ACETAMINOPHEN 500 MG PO TABS
ORAL_TABLET | ORAL | Status: AC
  Filled 2017-01-16: qty 2

## 2017-01-16 MED ORDER — PROPOFOL 500 MG/50ML IV EMUL
INTRAVENOUS | Status: DC | PRN
  Administered 2017-01-16: 50 ug/kg/min via INTRAVENOUS

## 2017-01-16 MED ORDER — ONDANSETRON HCL 4 MG/2ML IJ SOLN
INTRAMUSCULAR | Status: DC | PRN
  Administered 2017-01-16: 4 mg via INTRAVENOUS

## 2017-01-16 MED ORDER — MIDAZOLAM HCL 2 MG/2ML IJ SOLN
INTRAMUSCULAR | Status: DC | PRN
  Administered 2017-01-16: 2 mg via INTRAVENOUS

## 2017-01-16 MED ORDER — BUPIVACAINE-EPINEPHRINE 0.25% -1:200000 IJ SOLN
INTRAMUSCULAR | Status: DC | PRN
  Administered 2017-01-16: 20 mL

## 2017-01-16 MED ORDER — OXYCODONE HCL 5 MG PO TABS: 5.0000 mg | ORAL_TABLET | Freq: Once | ORAL | Status: DC | PRN

## 2017-01-16 MED ORDER — LACTATED RINGERS IV SOLN
INTRAVENOUS | Status: DC
  Administered 2017-01-16 (×2): via INTRAVENOUS

## 2017-01-16 MED ORDER — GLYCOPYRROLATE 0.2 MG/ML IJ SOLN
INTRAMUSCULAR | Status: AC
  Filled 2017-01-16: qty 1

## 2017-01-16 MED ORDER — MIDAZOLAM HCL 2 MG/2ML IJ SOLN
INTRAMUSCULAR | Status: AC
  Filled 2017-01-16: qty 2

## 2017-01-16 MED ORDER — BUPIVACAINE-EPINEPHRINE (PF) 0.25% -1:200000 IJ SOLN
INTRAMUSCULAR | Status: AC
  Filled 2017-01-16: qty 30

## 2017-01-16 SURGICAL SUPPLY — 28 items
ADH SKN CLS APL DERMABOND .7 (GAUZE/BANDAGES/DRESSINGS) ×1
BLADE SURG 15 STRL LF DISP TIS (BLADE) ×1 IMPLANT
BLADE SURG 15 STRL SS (BLADE) ×3
CANISTER SUCT 1200ML W/VALVE (MISCELLANEOUS) IMPLANT
CHLORAPREP W/TINT 26ML (MISCELLANEOUS) ×3 IMPLANT
DERMABOND ADVANCED (GAUZE/BANDAGES/DRESSINGS) ×2
DERMABOND ADVANCED .7 DNX12 (GAUZE/BANDAGES/DRESSINGS) ×1 IMPLANT
DRAPE LAPAROTOMY 77X122 PED (DRAPES) ×3 IMPLANT
ELECT CAUTERY BLADE 6.4 (BLADE) ×3 IMPLANT
ELECT REM PT RETURN 9FT ADLT (ELECTROSURGICAL) ×3
ELECTRODE REM PT RTRN 9FT ADLT (ELECTROSURGICAL) ×1 IMPLANT
GLOVE SURG SYN 7.0 (GLOVE) ×3 IMPLANT
GLOVE SURG SYN 7.0 PF PI (GLOVE) ×1 IMPLANT
GLOVE SURG SYN 7.5  E (GLOVE) ×2
GLOVE SURG SYN 7.5 E (GLOVE) ×1 IMPLANT
GLOVE SURG SYN 7.5 PF PI (GLOVE) ×1 IMPLANT
GOWN STRL REUS W/ TWL LRG LVL3 (GOWN DISPOSABLE) ×2 IMPLANT
GOWN STRL REUS W/TWL LRG LVL3 (GOWN DISPOSABLE) ×6
LABEL OR SOLS (LABEL) ×1 IMPLANT
NDL SAFETY 25GX1.5 (NEEDLE) ×3 IMPLANT
NS IRRIG 500ML POUR BTL (IV SOLUTION) ×3 IMPLANT
PACK BASIN MINOR ARMC (MISCELLANEOUS) ×3 IMPLANT
SUT MNCRL 4-0 (SUTURE) ×3
SUT MNCRL 4-0 27XMFL (SUTURE) ×1
SUT VIC AB 3-0 SH 27 (SUTURE) ×3
SUT VIC AB 3-0 SH 27X BRD (SUTURE) ×1 IMPLANT
SUTURE MNCRL 4-0 27XMF (SUTURE) ×1 IMPLANT
SYRINGE 10CC LL (SYRINGE) ×3 IMPLANT

## 2017-01-16 NOTE — Anesthesia Preprocedure Evaluation (Signed)
Anesthesia Evaluation  Patient identified by MRN, date of birth, ID band Patient awake    Reviewed: Allergy & Precautions, H&P , NPO status , Patient's Chart, lab work & pertinent test results  History of Anesthesia Complications Negative for: history of anesthetic complications  Airway Mallampati: III  TM Distance: <3 FB Neck ROM: limited    Dental  (+) Poor Dentition, Chipped   Pulmonary neg shortness of breath, sleep apnea , former smoker,    Pulmonary exam normal breath sounds clear to auscultation       Cardiovascular Exercise Tolerance: Good hypertension, + angina + CAD, + Past MI and + Cardiac Stents  (-) DOE Normal cardiovascular exam+ dysrhythmias  Rhythm:regular Rate:Normal     Neuro/Psych negative neurological ROS  negative psych ROS   GI/Hepatic negative GI ROS, Neg liver ROS, GERD  Controlled,  Endo/Other  Hypothyroidism   Renal/GU Renal disease  negative genitourinary   Musculoskeletal   Abdominal   Peds  Hematology negative hematology ROS (+)   Anesthesia Other Findings Past Medical History: No date: Abnormal white blood cell (WBC) count     Comment: seeing Dr. [name] at [facility] [place] on 10/25 No date: BPH (benign prostatic hypertrophy) No date: Bradycardia No date: CAD (coronary artery disease) No date: Chronic kidney disease No date: Dysrhythmia     Comment: bradycardia - sees Dr. Humphrey Rolls 05/2016: Esophageal cancer ([place]) No date: GERD (gastroesophageal reflux disease) No date: Hyperlipidemia No date: Hypertension No date: Hypothyroidism No date: Sleep apnea     Comment: mild - SS - Care everywhere -04/18/07 No date: Spinal stenosis     Comment: lumbar  Past Surgical History: No date: BACK SURGERY 03/12/2016: CARDIAC CATHETERIZATION Left     Comment: Procedure: Left Heart Cath and Coronary               Angiography;  Surgeon: [name], MD;                Location: [place] [facility];   Service:               Cardiovascular;  Laterality: Left; 03/12/2016: CARDIAC CATHETERIZATION N/A     Comment: Procedure: Coronary Stent Intervention;                Surgeon: Yolonda Kida, MD;  Location: [place] [facility];  Service: Cardiovascular;                Laterality: N/A; 07/13/2015: COLONOSCOPY WITH PROPOFOL N/A     Comment: Procedure: COLONOSCOPY WITH PROPOFOL;                Surgeon: Lucilla Lame, MD;  Location: [place];  Service: Endoscopy;  Laterality:              N/A; 10/05 and 12/05: CORONARY ANGIOPLASTY     Comment: 4 DES placed 06/14/2016: ESOPHAGOGASTRODUODENOSCOPY (EGD) WITH PROPOFOL N/A     Comment: Procedure: ESOPHAGOGASTRODUODENOSCOPY (EGD)               WITH PROPOFOL;  Surgeon: [name], MD;              Location: [facility];  Service: Endoscopy;               Laterality: N/A; No date: HERNIA REPAIR     Comment:  x3 07/13/2015: POLYPECTOMY     Comment: Procedure: POLYPECTOMY;  Surgeon: Lucilla Lame,              MD;  Location: [place];  Service:               Endoscopy;;  BMI    Body Mass Index:  30.57 kg/m      Reproductive/Obstetrics negative OB ROS                             Anesthesia Physical  Anesthesia Plan  ASA: IV  Anesthesia Plan: General and MAC   Post-op Pain Management:    Induction: Intravenous  Airway Management Planned: Natural Airway and Nasal Cannula  Additional Equipment:   Intra-op Plan: Utilization of Controlled Hypotension per surrgeon request  Post-operative Plan:   Informed Consent: I have reviewed the patients History and Physical, chart, labs and discussed the procedure including the risks, benefits and alternatives for the proposed anesthesia with the patient or authorized representative who has indicated his/her understanding and acceptance.   Dental Advisory Given  Plan Discussed with: Anesthesiologist, CRNA and  Surgeon  Anesthesia Plan Comments: (Patient has had new stent placed since the last time we saw him  Patient and wife informed that he is higher risk for complications from anesthesia during this procedure due to his medical history including but no limited to MI leading to death today or in the next month.  They voiced understanding. )       Anesthesia Quick Evaluation

## 2017-01-16 NOTE — Anesthesia Post-op Follow-up Note (Cosign Needed)
Anesthesia QCDR form completed.        

## 2017-01-16 NOTE — Interval H&P Note (Signed)
History and Physical Interval Note:  01/16/2017 6:23 AM  Corey Perry  has presented today for surgery, with the diagnosis of PORT IN PLACE  The various methods of treatment have been discussed with the patient and family. After consideration of risks, benefits and other options for treatment, the patient has consented to  Procedure(s): REMOVAL PORT-A-CATH (N/A) as a surgical intervention .  [name] history has been reviewed, patient examined, no change in status, stable for surgery.  I have reviewed the patient's chart and labs.  Questions were answered to the patient's satisfaction.     Trevionne Advani

## 2017-01-16 NOTE — Transfer of Care (Signed)
Immediate Anesthesia Transfer of Care Note  Patient: Corey Perry  Procedure(s) Performed: Procedure(s): REMOVAL PORT-A-CATH (N/A)  Patient Location: PACU  Anesthesia Type:General  Level of Consciousness: sedated  Airway & Oxygen Therapy: Patient Spontanous Breathing and Patient connected to nasal cannula oxygen  Post-op Assessment: Report given to RN and Post -op Vital signs reviewed and stable  Post vital signs: Reviewed and stable  Last Vitals:  Vitals:   01/16/17 0609  BP: 139/78  Pulse: (!) 59  Resp: 20  Temp: 36.4 C    Last Pain:  Vitals:   01/16/17 0609  TempSrc: Tympanic         Complications: No apparent anesthesia complications

## 2017-01-16 NOTE — Anesthesia Postprocedure Evaluation (Signed)
Anesthesia Post Note  Patient: DENZIL MCEACHRON  Procedure(s) Performed: Procedure(s) (LRB): REMOVAL PORT-A-CATH (N/A)  Patient location during evaluation: PACU Anesthesia Type: MAC Level of consciousness: awake and alert Pain management: pain level controlled Vital Signs Assessment: post-procedure vital signs reviewed and stable Respiratory status: spontaneous breathing, nonlabored ventilation, respiratory function stable and patient connected to nasal cannula oxygen Cardiovascular status: blood pressure returned to baseline and stable Postop Assessment: no signs of nausea or vomiting Anesthetic complications: no     Last Vitals:  Vitals:   01/16/17 0904 01/16/17 0925  BP: (!) 142/71 (!) 146/69  Pulse: (!) 47 (!) 49  Resp: 12 14  Temp: 36.4 C     Last Pain:  Vitals:   01/16/17 0904  TempSrc: Temporal  PainSc:                  Precious Haws Shelie Lansing

## 2017-01-16 NOTE — Progress Notes (Signed)
EKG completed and given to [facility]

## 2017-01-16 NOTE — Discharge Instructions (Signed)

## 2017-01-16 NOTE — H&P (View-Only) (Signed)
01/09/2017  History of Present Illness: Corey Perry is a 71 y.o. male with a history of esophageal cancer, s/p portacath placement on 10/23.  He had his chemotherapy and his esophagectomy already and is ready for port to be removed.  He denies any pain from portacath.  Denies any left upper extremity swelling or range of motion difficulties. He is tolerating a diet with good appetite and stable weight.  Feeding tube was removed a couple months ago.   Past Medical History: Past Medical History:  Diagnosis Date  . Abnormal white blood cell (WBC) count    seeing Dr. [name] at [place] on 10/25  . BPH (benign prostatic hypertrophy)   . Bradycardia   . CAD (coronary artery disease)   . Chronic kidney disease   . Dysrhythmia    bradycardia - sees Dr. Humphrey Rolls  . Esophageal cancer ([place]) 05/2016  . GERD (gastroesophageal reflux disease)   . Hyperlipidemia   . Hypertension   . Hypothyroidism   . Sleep apnea    mild - SS - Care everywhere -04/18/07  . Spinal stenosis    lumbar     Past Surgical History: Past Surgical History:  Procedure Laterality Date  . BACK SURGERY    . CARDIAC CATHETERIZATION Left 03/12/2016   Procedure: Left Heart Cath and Coronary Angiography;  Surgeon: [name], MD;  Location: [place] [facility];  Service: Cardiovascular;  Laterality: Left;  . CARDIAC CATHETERIZATION N/A 03/12/2016   Procedure: Coronary Stent Intervention;  Surgeon: Yolonda Kida, MD;  Location: [place] [facility];  Service: Cardiovascular;  Laterality: N/A;  . COLONOSCOPY WITH PROPOFOL N/A 07/13/2015   Procedure: COLONOSCOPY WITH PROPOFOL;  Surgeon: Lucilla Lame, MD;  Location: [place];  Service: Endoscopy;  Laterality: N/A;  . CORONARY ANGIOPLASTY  10/05 and 12/05   4 DES placed  . ESOPHAGOGASTRODUODENOSCOPY (EGD) WITH PROPOFOL N/A 06/14/2016   Procedure: ESOPHAGOGASTRODUODENOSCOPY (EGD) WITH PROPOFOL;  Surgeon: [name], MD;  Location: [facility] END[facility]COPY;   Service: Endoscopy;  Laterality: N/A;  . HERNIA REPAIR     x3  . POLYPECTOMY  07/13/2015   Procedure: POLYPECTOMY;  Surgeon: Lucilla Lame, MD;  Location: [place];  Service: Endoscopy;;  . PORTACATH PLACEMENT N/A 07/08/2016   Procedure: INSERTION PORT-A-CATH;  Surgeon: Olean Ree, MD;  Location: [facility];  Service: General;  Laterality: N/A;    Home Medications: Prior to Admission medications   Medication Sig Start Date End Date Taking? Authorizing Provider  aspirin 81 MG tablet Take 81 mg by mouth daily. AM   Yes Historical Provider, MD  clopidogrel (PLAVIX) 75 MG tablet Take 1 tablet (75 mg total) by mouth daily. 11/21/16  Yes Megan P Johnson, DO  isosorbide mononitrate (IMDUR) 30 MG 24 hr tablet 30 mg daily. AM 06/12/15  Yes Historical Provider, MD  levothyroxine (SYNTHROID, LEVOTHROID) 50 MCG tablet Take 1 tablet (50 mcg total) by mouth daily. 11/18/16  Yes Volney American, PA-C  lisinopril (PRINIVIL,ZESTRIL) 10 MG tablet Take 1 tablet (10 mg total) by mouth daily. 11/18/16  Yes Volney American, PA-C  multivitamin-iron-minerals-folic acid (CENTRUM) chewable tablet Chew 1 tablet by mouth daily.   Yes Historical Provider, MD  nitroGLYCERIN (NITROSTAT) 0.4 MG SL tablet Place 1 tablet under the tongue every 5 (five) minutes as needed. 03/08/16  Yes Historical Provider, MD  pantoprazole (PROTONIX) 20 MG tablet Take 1 tablet (20 mg total) by mouth daily. 06/24/16  Yes [name], MD  simvastatin (ZOCOR) 40  MG tablet Take 1 tablet (40 mg total) by mouth daily. 06/24/16  Yes [name], MD  tamsulosin (FLOMAX) 0.4 MG CAPS capsule Take 1 capsule (0.4 mg total) by mouth daily. 12/21/16  Yes Delman Kitten, MD    Allergies: No Known Allergies  Review of Systems: Review of Systems  Constitutional: Negative for chills and fever.  Respiratory: Negative for shortness of breath.   Cardiovascular: Negative for chest pain.  Gastrointestinal: Negative for abdominal pain, nausea and  vomiting.  Musculoskeletal: Negative for myalgias.  Skin: Negative for rash.    Physical Exam BP (!) 147/76   Pulse (!) 53   Temp 97.6 F (36.4 C) (Oral)   Wt 81.6 kg (180 lb)   BMI 26.58 kg/m  CONSTITUTIONAL: No acute distress, well nourished and in good spirits. HEENT:  Normocephalic, atraumatic, extraocular motion intact. CHEST:  Left subclavian portacath in place.  Left chest incision is well healed without evidence of infection.  Palpable catheter going underneath the left clavicle. RESPIRATORY:  Lungs are clear, and breath sounds are equal bilaterally. Normal respiratory effort without pathologic use of accessory muscles. CARDIOVASCULAR: Heart is regular without murmurs, gallops, or rubs. GI: The abdomen is soft, nondistended, nontender. PSYCH:  Alert and oriented to person, place and time. Affect is normal.  Labs/Imaging: None  Assessment and Plan: This is a 71 y.o. male w/ hx of esophageal cancer, s/p neoadjuvant chemoradiation and esophagectomy.   --Discussed with patient that procedure of removing the port-a-cath as outpatient procedure and discussed the risks and benefits of the procedure including bleeding, infection.  Informed the patient that he would need to be off his Plavix for 5 days before surgery but that he can continue his Aspirin.  Patient will be scheduled for May 3rd, 2018, in AM.  All questions have been answered.   [name], [name]

## 2017-01-16 NOTE — Progress Notes (Signed)
Dr

## 2017-01-16 NOTE — Op Note (Signed)
  Procedure Date:  01/16/2017  Pre-operative Diagnosis:  Port-a-cath in place  Post-operative Diagnosis:  Port-a-cath in place  Procedure:  Removal of left subclavian port-a-cath  Surgeon:  [name], MD  Anesthesia:  MAC + 20 ml of local anesthetic  Estimated Blood Loss:  2 ml  Specimens:  Port and capsule  Complications:  None  Indications for Procedure:  This is a 71 y.o. male with a history of esophageal cancer, who had a port-a-cath placed last year.  He is done with chemotherapy now and needs the port-a-cath removed.  The risks of bleeding, infection, and injury to surrounding structures were discussed with the patient and was willing to proceed.  Description of Procedure: The patient was correctly identified in the preoperative area and brought into the operating room.  The patient was placed supine with VTE prophylaxis in place.  Appropriate time-outs were performed.  Anesthesia was induced and the patient was sedated.  Appropriate antibiotics were infused.  The left chest and neck were prepped and draped in usual sterile fashion. Local anesthetic was infiltrated into the skin and subcutaneous tissues at the prior port incision. The scar was excised sharply and with cautery.  Dissection was carried out subcutaneously and around the port using cautery in order to free the port.  The two prolene sutures holding it in place were cut and removed.  The port was removed with the catheter and pressure was held at the subclavian vein insertion site for 5 minutes for hemostasis.  The capsule around the port was excised as well and sent with the port to pathology.  Hemostasis was controlled with cautery.  The wound was then irrigated and cleaned.  More local anesthetic was infused for a total of 20 ml.  The wound was then closed in two layers with interrupted 3-0 Vicryl followed by 4-0 subcuticular Monocryl sutures and sealed with DermaBond.  The patient was emerged from anesthesia and  brought to the recovery room for further management.  The patient tolerated the procedure well and all counts were correct at the end of the case.   [name], MD

## 2017-01-16 NOTE — Telephone Encounter (Signed)
No answer. Vm full. - will send a letter.

## 2017-01-17 LAB — SURGICAL PATHOLOGY

## 2017-01-22 ENCOUNTER — Encounter: Payer: Self-pay | Admitting: Family Medicine

## 2017-01-22 ENCOUNTER — Ambulatory Visit (INDEPENDENT_AMBULATORY_CARE_PROVIDER_SITE_OTHER): Payer: Medicare Other | Admitting: Family Medicine

## 2017-01-22 VITALS — BP 120/73 | HR 69 | Wt 176.6 lb

## 2017-01-22 DIAGNOSIS — C155 Malignant neoplasm of lower third of esophagus: Secondary | ICD-10-CM | POA: Diagnosis not present

## 2017-01-22 DIAGNOSIS — I10 Essential (primary) hypertension: Secondary | ICD-10-CM

## 2017-01-22 DIAGNOSIS — E039 Hypothyroidism, unspecified: Secondary | ICD-10-CM

## 2017-01-22 MED ORDER — CLOPIDOGREL BISULFATE 75 MG PO TABS: 75.0000 mg | ORAL_TABLET | Freq: Every day | ORAL | 2 refills | Status: DC

## 2017-01-22 MED ORDER — LEVOTHYROXINE SODIUM 50 MCG PO TABS: 50.0000 ug | ORAL_TABLET | Freq: Every day | ORAL | 2 refills | Status: DC

## 2017-01-22 MED ORDER — LISINOPRIL 10 MG PO TABS: 10.0000 mg | ORAL_TABLET | ORAL | 2 refills | Status: DC

## 2017-01-22 NOTE — Assessment & Plan Note (Signed)
The current medical regimen is effective;  continue present plan and medications.  

## 2017-01-22 NOTE — Progress Notes (Signed)
   BP 120/73   Pulse 69   Wt 176 lb 9.6 oz (80.1 kg)   SpO2 98%   BMI 26.08 kg/m    Subjective:    Patient ID: Corey Perry, male    DOB: [birth year], 71 y.o.   MRN: [id number]  HPI: Corey Perry is a 71 y.o. male  Chief Complaint  Patient presents with  . Follow-up  . Hypertension  Patient follow-up blood pressure doing well spends her whole lot with esophageal surgery for cancer and chemotherapy. Patient recovering well, is able to eat and drink without problems. Taking medications without problems. Patient also with hypothyroid hasn't had TSH checked in some time will check again today. Patient otherwise doing well with medications Relevant past medical, surgical, family and social history reviewed and updated as indicated. Interim medical history since our last visit reviewed. Allergies and medications reviewed and updated.  Review of Systems  Constitutional: Negative.   Respiratory: Negative.   Cardiovascular: Negative.     Per HPI unless specifically indicated above     Objective:    BP 120/73   Pulse 69   Wt 176 lb 9.6 oz (80.1 kg)   SpO2 98%   BMI 26.08 kg/m   Wt Readings from Last 3 Encounters:  01/22/17 176 lb 9.6 oz (80.1 kg)  01/09/17 180 lb (81.6 kg)  12/25/16 183 lb 6.4 oz (83.2 kg)    Physical Exam  Constitutional: He is oriented to person, place, and time. He appears well-developed and well-nourished.  HENT:  Head: Normocephalic and atraumatic.  Eyes: Conjunctivae and EOM are normal.  Neck: Normal range of motion.  Cardiovascular: Normal rate, regular rhythm and normal heart sounds.   Pulmonary/Chest: Effort normal and breath sounds normal.  Musculoskeletal: Normal range of motion.  Neurological: He is alert and oriented to person, place, and time.  Skin: No erythema.  Psychiatric: He has a normal mood and affect. His behavior is normal. Judgment and thought content normal.        Assessment & Plan:   Problem List Items  Addressed This Visit      Cardiovascular and Mediastinum   Hypertension - Primary    The current medical regimen is effective;  continue present plan and medications.       Relevant Medications   lisinopril (PRINIVIL,ZESTRIL) 10 MG tablet   Other Relevant Orders   Basic metabolic panel     Digestive   Esophageal cancer ([place])    Followed by [facility] Drs. getting good reports.      Relevant Medications   clopidogrel (PLAVIX) 75 MG tablet     Endocrine   Hypothyroid    The current medical regimen is effective;  continue present plan and medications.       Relevant Medications   levothyroxine (SYNTHROID, LEVOTHROID) 50 MCG tablet   Other Relevant Orders   TSH       Follow up plan: Return in about 6 months (around 07/25/2017) for Physical Exam.

## 2017-01-22 NOTE — Assessment & Plan Note (Signed)
Followed by [facility] Drs. getting good reports.

## 2017-01-23 LAB — BASIC METABOLIC PANEL
BUN/Creatinine Ratio: 11 (ref 10–24)
BUN: 14 mg/dL (ref 8–27)
CALCIUM: 9.1 mg/dL (ref 8.6–10.2)
CHLORIDE: 105 mmol/L (ref 96–106)
CO2: 26 mmol/L (ref 18–29)
Creatinine, Ser: 1.22 mg/dL (ref 0.76–1.27)
GFR calc non Af Amer: 60 mL/min/{1.73_m2} (ref >59)
GFR, EST AFRICAN AMERICAN: 69 mL/min/{1.73_m2} (ref >59)
GLUCOSE: 99 mg/dL (ref 65–99)
Potassium: 3.6 mmol/L (ref 3.5–5.2)
Sodium: 143 mmol/L (ref 134–144)

## 2017-01-29 ENCOUNTER — Ambulatory Visit (INDEPENDENT_AMBULATORY_CARE_PROVIDER_SITE_OTHER): Payer: Medicare Other | Admitting: Surgery

## 2017-01-29 ENCOUNTER — Encounter: Payer: Self-pay | Admitting: Surgery

## 2017-01-29 VITALS — BP 150/73 | HR 53 | Temp 97.7°F | Wt 177.0 lb

## 2017-01-29 DIAGNOSIS — Z09 Encounter for follow-up examination after completed treatment for conditions other than malignant neoplasm: Secondary | ICD-10-CM

## 2017-01-29 LAB — TSH: TSH: 1.35 u[IU]/mL (ref 0.450–4.500)

## 2017-01-29 NOTE — Patient Instructions (Signed)
Please give us a call in case you have any questions or concerns.  

## 2017-01-29 NOTE — Progress Notes (Signed)
S/p port removal by Dr. Hampton Abbot 5/3 No issues  wound healing well, no infection  A/P Doing well F/U prn No surgical issues

## 2017-01-30 ENCOUNTER — Encounter: Payer: Medicare Other | Admitting: Surgery

## 2017-01-30 DIAGNOSIS — Z8501 Personal history of malignant neoplasm of esophagus: Secondary | ICD-10-CM | POA: Diagnosis not present

## 2017-01-30 DIAGNOSIS — R911 Solitary pulmonary nodule: Secondary | ICD-10-CM | POA: Diagnosis not present

## 2017-01-30 DIAGNOSIS — C159 Malignant neoplasm of esophagus, unspecified: Secondary | ICD-10-CM | POA: Diagnosis not present

## 2017-01-30 DIAGNOSIS — Z87891 Personal history of nicotine dependence: Secondary | ICD-10-CM | POA: Diagnosis not present

## 2017-01-30 DIAGNOSIS — Z9049 Acquired absence of other specified parts of digestive tract: Secondary | ICD-10-CM | POA: Diagnosis not present

## 2017-01-30 DIAGNOSIS — Z08 Encounter for follow-up examination after completed treatment for malignant neoplasm: Secondary | ICD-10-CM | POA: Diagnosis not present

## 2017-02-02 DIAGNOSIS — N200 Calculus of kidney: Secondary | ICD-10-CM | POA: Insufficient documentation

## 2017-02-02 NOTE — Progress Notes (Deleted)
02/04/2017 1:31 PM   Jenny Reichmann Orlando Penner Mar 30, 1946 [id number]  Referring provider: [name], MD [address] [place], [place] [address]  CC: Follow UP Nephrolithiasis  HPI:  1 - Left Ureteral Stone - s/p medical passsage of left 78m UVJ stone 12/2016. CT with no addiitonal stones. No prior colic. F/U [facility] resolved hydro.  PMH sig for esophageal cancer / esophagectomy, CAD/Plavix. His PCP is MGolden PopMD.  Today "JKamarrion is seen in f/u above. INterval renal UKoreaconirms no left hydro and stone free.   PMH: Past Medical History:  Diagnosis Date  . Abnormal white blood cell (WBC) count    seeing Dr. F[facility]land Ormondat [facility]  . Atrial fibrillation ([facility]   . BPH (benign prostatic hypertrophy)   . Bradycardia   . CAD (coronary artery disease)   . Dysrhythmia    bradycardia - sees Dr. KHumphrey Rolls . [place]ophageal [place]  . GERD (gastroesophageal reflux disease)   . History of kidney stones   . Hyperlipidemia   . Hypertension   . Hypothyroidism   . [place] 2005  . Sleep apnea    mild-NO CPAP  . Spinal stenosis    lumbar    Surgical History: Past Surgical History:  Procedure Laterality Date  . BACK SURGERY    . CARDIAC CATHETERIZATION Left 03/12/2016   Procedure: Left Heart Cath and Coronary Angiography;  Surgeon: SDionisio David MD;  [facility];  Service: Cardiovascular;  Laterality: Left;  . CARDIAC CATHETERIZATION N/A 03/12/2016   Procedure: Coronary Stent Intervention;  Surgeon: DYolonda Kida MD;  [facility];  Service: Cardiovascular;  Laterality: N/A;  . COLONOSCOPY WITH PROPOFOL N/A 07/13/2015   Procedure: COLONOSCOPY WITH PROPOFOL;  Surgeon: DLucilla Lame MD;  [place] [facility]  Service: Endoscopy;  Laterality: N/A;  . CORONARY ANGIOPLASTY  10/05 and 12/05   4 DES placed  . ESOPHAGECTOMY  10/07/2016   @ [facility]  . ESOPHAGOGASTRODUODENOSCOPY (EGD) WITH PROPOFOL N/A  06/14/2016   Procedure: ESOPHAGOGASTRODUODENOSCOPY (EGD) WITH PROPOFOL;  Surgeon: MLollie Sails MD;  [facility];  Service: Endoscopy;  Laterality: N/A;  . HERNIA REPAIR     x3  . POLYPECTOMY  07/13/2015   Procedure: POLYPECTOMY;  Surgeon: DLucilla Lame MD;  [place] [facility]  Service: Endoscopy;;  . PORT-A-CATH REMOVAL N/A 01/16/2017   Procedure: REMOVAL PORT-A-CATH;  Surgeon: JOlean Ree MD;  Location: [facility];  Service: General;  Laterality: N/A;  . PORTACATH PLACEMENT N/A 07/08/2016   Procedure: INSERTION PORT-A-CATH;  Surgeon: JOlean Ree MD;  Location: [facility];  Service: General;  Laterality: N/A;    Home Medications:  Allergies as of 02/04/2017   No Known Allergies     Medication List       Accurate as of 02/02/17  1:31 PM. Always use your most recent med list.          acetaminophen 500 MG tablet Commonly known as:  TYLENOL Take 500 mg by mouth every 6 (six) hours as needed for mild pain or moderate pain.   aspirin 81 MG tablet Take 81 mg by mouth daily. AM   clopidogrel 75 MG tablet Commonly known as:  PLAVIX Take 1 tablet (75 mg total) by mouth daily.   isosorbide mononitrate 30 MG 24 hr tablet Commonly known as:  IMDUR Take 30 mg by mouth daily. AM   levothyroxine 50 MCG tablet Commonly known as:  SYNTHROID,  LEVOTHROID Take 1 tablet (50 mcg total) by mouth daily.   lisinopril 10 MG tablet Commonly known as:  PRINIVIL,ZESTRIL Take 1 tablet (10 mg total) by mouth every morning.   multivitamin-iron-minerals-folic acid chewable tablet Chew 1 tablet by mouth daily.   nitroGLYCERIN 0.4 MG SL tablet Commonly known as:  NITROSTAT Place 1 tablet under the tongue every 5 (five) minutes as needed for chest pain.   pantoprazole 20 MG tablet Commonly known as:  PROTONIX Take 1 tablet (20 mg total) by mouth daily.   simvastatin 40 MG tablet Commonly known as:  ZOCOR Take 1 tablet (40 mg total) by mouth daily.        Allergies: No Known Allergies  Family History: Family History  Problem Relation Age of Onset  . Hypertension Mother   . Dementia Mother   . Heart disease Father   . Hypertension Father   . Kidney disease Father   . Hypertension Brother   . Heart disease Brother   . Diabetes Son   . Hypertension Son   . Hyperlipidemia Son   . Hyperlipidemia Daughter   . Hypertension Daughter     Social History:  reports that he quit smoking about 12 years ago. His smoking use included Cigarettes. He has a 40.00 pack-year smoking history. He has quit using smokeless tobacco. His smokeless tobacco use included Chew. He reports that he does not drink alcohol or use drugs.      Review of Systems  Gastrointestinal (upper)  : Negative for upper GI symptoms  Gastrointestinal (lower) : Negative for lower GI symptoms  Constitutional : Negative for symptoms  Skin: Negative for skin symptoms  Eyes: Negative for eye symptoms  Ear/Nose/Throat : Negative for Ear/Nose/Throat symptoms  Hematologic/Lymphatic: Negative for Hematologic/Lymphatic symptoms  Cardiovascular : Negative for cardiovascular symptoms  Respiratory : Negative for respiratory symptoms  Endocrine: Negative for endocrine symptoms  Musculoskeletal: Negative for musculoskeletal symptoms  Neurological: Negative for neurological symptoms  Psychologic: Negative for psychiatric symptoms   Physical Exam: There were no vitals taken for this visit.  Constitutional:  Alert and oriented, No acute distress. HEENT: [place] AT, moist mucus membranes.  Trachea midline, no masses. Cardiovascular: No clubbing, cyanosis, or edema. Respiratory: Normal respiratory effort, no increased work of breathing. GI: Abdomen is soft, nontender, nondistended, no abdominal masses GU: No CVA tenderness. Skin: No rashes, bruises or suspicious lesions. Lymph: No cervical or inguinal adenopathy. Neurologic: Grossly intact, no focal deficits,  moving all 4 extremities. Psychiatric: Normal mood and affect.  Laboratory Data: Lab Results  Component Value Date   WBC 4.8 12/21/2016   HGB 11.6 (L) 12/21/2016   HCT 35.1 (L) 12/21/2016   MCV 98.4 12/21/2016   PLT 153 12/21/2016    Lab Results  Component Value Date   CREATININE 1.22 01/22/2017    No results found for: PSA  No results found for: TESTOSTERONE  No results found for: HGBA1C  Urinalysis    Component Value Date/Time   COLORURINE AMBER (A) 12/21/2016 1230   APPEARANCEUR Clear 12/25/2016 1103   LABSPEC 1.024 12/21/2016 1230   PHURINE 5.0 12/21/2016 1230   GLUCOSEU Negative 12/25/2016 1103   HGBUR SMALL (A) 12/21/2016 1230   BILIRUBINUR Negative 12/25/2016 1103   KETONESUR NEGATIVE 12/21/2016 1230   PROTEINUR 3+ (A) 12/25/2016 1103   PROTEINUR 30 (A) 12/21/2016 1230   NITRITE Negative 12/25/2016 1103   NITRITE NEGATIVE 12/21/2016 1230   LEUKOCYTESUR Trace (A) 12/25/2016 1103    Pertinent Imaging: CT scan  Assessment & Plan:    1- left ureteral stone -  Resolved by sbejctive and objective measures. General dietary recs reinforced as well as <50% future lifetime risk of recurecne.  RTC Urol PRN  Alexis Frock, MD  [place] [address] [place], [place] [address] [phone]

## 2017-02-04 ENCOUNTER — Ambulatory Visit: Payer: Medicare Other | Admitting: Oncology

## 2017-02-04 ENCOUNTER — Ambulatory Visit: Payer: Medicare Other

## 2017-02-17 NOTE — Progress Notes (Deleted)
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  INTERVAL HISTORY: Patient returns to clinic today for further evaluation and postoperative follow-up. He completed his esophagectomy without significant complications. He currently feels well and is nearly back to his baseline.  His swallowing is much improved and he reports being able to tolerate solid foods. He denies any recent fevers or illnesses. He has no neurologic complaints. He denies any chest pain or shortness of breath. He denies any nausea, vomiting, constipation, or diarrhea. He reports nocturia 2-3 times per night which is chronic, no other urinary complaints. Patient offers no further specific complaints today.  REVIEW OF SYSTEMS:   Review of Systems  Constitutional: Negative for fever, malaise/fatigue and weight loss.  HENT: Negative.   Respiratory: Negative.  Negative for cough and shortness of breath.   Cardiovascular: Negative.  Negative for chest pain and leg swelling.  Gastrointestinal: Negative for abdominal pain, blood in stool, constipation, diarrhea, melena, nausea and vomiting.  Genitourinary: Negative for frequency and urgency.       Nocturia  Musculoskeletal: Negative.   Neurological: Negative.  Negative for tingling, weakness and headaches.  Psychiatric/Behavioral: The patient does not have insomnia.     As per HPI. Otherwise, a complete review of systems is negative.  PAST MEDICAL HISTORY: Past Medical History:  Diagnosis Date  . Abnormal  white blood cell (WBC) count    seeing Dr. [name] at [facility] on 10/25  . Atrial fibrillation ([place])   . BPH (benign prostatic hypertrophy)   . Bradycardia   . CAD (coronary artery disease)   . Dysrhythmia    bradycardia - sees Dr. Humphrey Rolls  . Esophageal cancer ([place]) 05/2016  . GERD (gastroesophageal reflux disease)   . History of kidney stones   . Hyperlipidemia   . Hypertension   . Hypothyroidism   . Myocardial infarction ([place]) 2005  . Sleep apnea    mild-NO CPAP  . Spinal stenosis    lumbar    PAST SURGICAL HISTORY: Past Surgical History:  Procedure Laterality Date  . BACK SURGERY    . CARDIAC CATHETERIZATION Left 03/12/2016   Procedure: Left Heart Cath and Coronary Angiography;  Surgeon: [name], MD;  Location: [place] [facility];  Service: Cardiovascular;  Laterality: Left;  . CARDIAC CATHETERIZATION N/A 03/12/2016   Procedure: Coronary Stent Intervention;  Surgeon: Yolonda Kida, MD;  Location: [facility];  Service: Cardiovascular;  Laterality: N/A;  . COLONOSCOPY WITH PROPOFOL N/A 07/13/2015   Procedure: COLONOSCOPY WITH PROPOFOL;  Surgeon: Lucilla Lame, MD;  Location: [facility];  Service: Endoscopy;  Laterality: N/A;  . CORONARY ANGIOPLASTY  10/05 and 12/05   4 DES placed  . ESOPHAGECTOMY  10/07/2016   @ [facility]  . ESOPHAGOGASTRODUODENOSCOPY (EGD) WITH PROPOFOL N/A 06/14/2016   Procedure: ESOPHAGOGASTRODUODENOSCOPY (EGD) WITH PROPOFOL;  Surgeon: [name], MD;  [facility];  Service: Endoscopy;  Laterality: N/A;  . HERNIA REPAIR     x3  . POLYPECTOMY  07/13/2015   Procedure: POLYPECTOMY;  Surgeon: Lucilla Lame, MD;  Location: [facility];  Service:  Endoscopy;;  . PORT-A-CATH REMOVAL N/A 01/16/2017   Procedure: REMOVAL PORT-A-CATH;  Surgeon: Olean Ree, MD;  Location: [facility];  Service: General;  Laterality: N/A;  . PORTACATH PLACEMENT N/A 07/08/2016   Procedure: INSERTION PORT-A-CATH;  Surgeon: Olean Ree, MD;   Location: [facility];  Service: General;  Laterality: N/A;    FAMILY HISTORY Family History  Problem Relation Age of Onset  . Hypertension Mother   . Dementia Mother   . Heart disease Father   . Hypertension Father   . Kidney disease Father   . Hypertension Brother   . Heart disease Brother   . Diabetes Son   . Hypertension Son   . Hyperlipidemia Son   . Hyperlipidemia Daughter   . Hypertension Daughter        ADVANCED DIRECTIVES:    HEALTH MAINTENANCE: Social History  Substance Use Topics  . Smoking status: Former Smoker    Packs/day: 1.00    Years: 40.00    Types: Cigarettes    Quit date: 07/16/2004  . Smokeless tobacco: Former Systems developer    Types: Chew  . Alcohol use No     Colonoscopy:  PAP:  Bone density:  Lipid panel:  No Known Allergies  Current Outpatient Prescriptions  Medication Sig Dispense Refill  . acetaminophen (TYLENOL) 500 MG tablet Take 500 mg by mouth every 6 (six) hours as needed for mild pain or moderate pain.    Marland Kitchen aspirin 81 MG tablet Take 81 mg by mouth daily. AM    . clopidogrel (PLAVIX) 75 MG tablet Take 1 tablet (75 mg total) by mouth daily. 90 tablet 2  . isosorbide mononitrate (IMDUR) 30 MG 24 hr tablet Take 30 mg by mouth daily. AM  5  . levothyroxine (SYNTHROID, LEVOTHROID) 50 MCG tablet Take 1 tablet (50 mcg total) by mouth daily. 90 tablet 2  . lisinopril (PRINIVIL,ZESTRIL) 10 MG tablet Take 1 tablet (10 mg total) by mouth every morning. 90 tablet 2  . multivitamin-iron-minerals-folic acid (CENTRUM) chewable tablet Chew 1 tablet by mouth daily.    . nitroGLYCERIN (NITROSTAT) 0.4 MG SL tablet Place 1 tablet under the tongue every 5 (five) minutes as needed for chest pain.   98  . pantoprazole (PROTONIX) 20 MG tablet Take 1 tablet (20 mg total) by mouth daily. (Patient taking differently: Take 20 mg by mouth every morning. ) 90 tablet 4  . simvastatin (ZOCOR) 40 MG tablet Take 1 tablet (40 mg total) by mouth daily. (Patient taking  differently: Take 40 mg by mouth every morning. ) 90 tablet 4   No current facility-administered medications for this visit.     OBJECTIVE: There were no vitals filed for this visit.   There is no height or weight on file to calculate BMI.    ECOG FS:0 - Asymptomatic  General: Well-developed, well-nourished, no acute distress. Eyes: Pink conjunctiva, anicteric sclera. Lungs: Clear to auscultation bilaterally. Heart: Regular rate and rhythm. No rubs, murmurs, or gallops. Abdomen: Soft, nontender, nondistended. No organomegaly noted, normoactive bowel sounds. Musculoskeletal: No edema, cyanosis, or clubbing. Neuro: Alert, answering all questions appropriately. Cranial nerves grossly intact. Skin: No rashes or petechiae noted. Psych: Normal affect.   LAB RESULTS:  Lab Results  Component Value Date   NA 143 01/22/2017   K 3.6 01/22/2017   CL 105 01/22/2017   CO2 26 01/22/2017   GLUCOSE 99 01/22/2017   BUN 14 01/22/2017   CREATININE 1.22 01/22/2017   CALCIUM 9.1 01/22/2017   PROT 7.1 12/21/2016  ALBUMIN 4.1 12/21/2016   AST 26 12/21/2016   ALT 15 (L) 12/21/2016   ALKPHOS 49 12/21/2016   BILITOT 0.7 12/21/2016   GFRNONAA 60 01/22/2017   GFRAA 69 01/22/2017    Lab Results  Component Value Date   WBC 4.8 12/21/2016   NEUTROABS 3.0 11/28/2016   HGB 11.6 (L) 12/21/2016   HCT 35.1 (L) 12/21/2016   MCV 98.4 12/21/2016   PLT 153 12/21/2016     STUDIES: No results found.  ASSESSMENT: Pathologic Stage IIb (T3, N0, M0) adenocarcinoma of the lower third of the esophagus.  PLAN:    1. Stage IIb adenocarcinoma of the lower third of the esophagus: Patient had EUS completed at [facility] confirming the stage and diagnosis. He completed 6 cycles of weekly carboplatinum and Taxol completed on August 21, 2016 and daily XRT completed on August 27, 2016. Patient had his esophagectomy on October 07, 2016. He has imaging scheduled at [facility] in approximately 2 months.  Patient will then follow-up 1-2 days later for further evaluation.  2. Thrombocytopenia: Most recent platelet count was within normal limits. Previously entire workup did not reveal a distinct etiology. No bone marrow biopsy has been performed to this point. Continue to monitor closely.  4. Renal insufficiency: Patient's creatinine is now within normal limits, monitor.  5. Nocturia: Consider urology referral if symptoms continue to be disruptive.  6. Port: Have sent a referral to surgery for port removal.  Patient expressed understanding and was in agreement with this plan. He also understands that He can call clinic at any time with any questions, concerns, or complaints.    [name], MD 02/17/17 12:58 PM

## 2017-02-18 ENCOUNTER — Inpatient Hospital Stay: Payer: Medicare Other | Admitting: Oncology

## 2017-02-18 ENCOUNTER — Inpatient Hospital Stay: Payer: Medicare Other

## 2017-02-21 ENCOUNTER — Other Ambulatory Visit: Payer: Self-pay | Admitting: Family Medicine

## 2017-02-21 DIAGNOSIS — E039 Hypothyroidism, unspecified: Secondary | ICD-10-CM

## 2017-02-21 NOTE — Telephone Encounter (Signed)
Patient needs a refill sent on his Levothyroxine sent to [facility] in [employer]   [place]

## 2017-02-21 NOTE — Telephone Encounter (Signed)
Last OV: 01/22/17 Next OV: 07/29/17  Lab Results  Component Value Date   TSH 1.350 01/22/2017

## 2017-02-22 MED ORDER — LEVOTHYROXINE SODIUM 50 MCG PO TABS: 50.0000 ug | ORAL_TABLET | Freq: Every day | ORAL | 2 refills | Status: DC

## 2017-02-27 ENCOUNTER — Ambulatory Visit
Admission: EM | Admit: 2017-02-27 | Discharge: 2017-02-27 | Disposition: A | Discharge status: Home / Self Care | Payer: Medicare Other | Attending: Emergency Medicine | Admitting: Emergency Medicine

## 2017-02-27 ENCOUNTER — Encounter: Payer: Self-pay | Admitting: *Deleted

## 2017-02-27 DIAGNOSIS — E785 Hyperlipidemia, unspecified: Secondary | ICD-10-CM | POA: Diagnosis not present

## 2017-02-27 DIAGNOSIS — E039 Hypothyroidism, unspecified: Secondary | ICD-10-CM | POA: Diagnosis not present

## 2017-02-27 DIAGNOSIS — N183 Chronic kidney disease, stage 3 (moderate): Secondary | ICD-10-CM | POA: Diagnosis not present

## 2017-02-27 DIAGNOSIS — W57XXXA Bitten or stung by nonvenomous insect and other nonvenomous arthropods, initial encounter: Secondary | ICD-10-CM | POA: Insufficient documentation

## 2017-02-27 DIAGNOSIS — K219 Gastro-esophageal reflux disease without esophagitis: Secondary | ICD-10-CM | POA: Insufficient documentation

## 2017-02-27 DIAGNOSIS — S80861A Insect bite (nonvenomous), right lower leg, initial encounter: Secondary | ICD-10-CM | POA: Insufficient documentation

## 2017-02-27 DIAGNOSIS — Z7982 Long term (current) use of aspirin: Secondary | ICD-10-CM | POA: Diagnosis not present

## 2017-02-27 DIAGNOSIS — I252 Old myocardial infarction: Secondary | ICD-10-CM | POA: Diagnosis not present

## 2017-02-27 DIAGNOSIS — Z7902 Long term (current) use of antithrombotics/antiplatelets: Secondary | ICD-10-CM | POA: Insufficient documentation

## 2017-02-27 DIAGNOSIS — I2510 Atherosclerotic heart disease of native coronary artery without angina pectoris: Secondary | ICD-10-CM | POA: Insufficient documentation

## 2017-02-27 DIAGNOSIS — Z8501 Personal history of malignant neoplasm of esophagus: Secondary | ICD-10-CM | POA: Insufficient documentation

## 2017-02-27 DIAGNOSIS — D72819 Decreased white blood cell count, unspecified: Secondary | ICD-10-CM | POA: Insufficient documentation

## 2017-02-27 DIAGNOSIS — I129 Hypertensive chronic kidney disease with stage 1 through stage 4 chronic kidney disease, or unspecified chronic kidney disease: Secondary | ICD-10-CM | POA: Diagnosis not present

## 2017-02-27 DIAGNOSIS — R21 Rash and other nonspecific skin eruption: Secondary | ICD-10-CM | POA: Diagnosis not present

## 2017-02-27 DIAGNOSIS — Z79899 Other long term (current) drug therapy: Secondary | ICD-10-CM | POA: Insufficient documentation

## 2017-02-27 DIAGNOSIS — Z87891 Personal history of nicotine dependence: Secondary | ICD-10-CM | POA: Diagnosis not present

## 2017-02-27 DIAGNOSIS — D696 Thrombocytopenia, unspecified: Secondary | ICD-10-CM | POA: Diagnosis not present

## 2017-02-27 LAB — CBC WITH DIFFERENTIAL/PLATELET
Basophils Absolute: 0 10*3/uL (ref 0–0.1)
Basophils Relative: 1 %
Eosinophils Absolute: 0.1 10*3/uL (ref 0–0.7)
Eosinophils Relative: 5 %
HEMATOCRIT: 35 % — AB (ref 40.0–52.0)
Hemoglobin: 11.3 g/dL — ABNORMAL LOW (ref 13.0–18.0)
LYMPHS ABS: 0.6 10*3/uL — AB (ref 1.0–3.6)
LYMPHS PCT: 22 %
MCH: 29.7 pg (ref 26.0–34.0)
MCHC: 32.3 g/dL (ref 32.0–36.0)
MCV: 92 fL (ref 80.0–100.0)
MONOS PCT: 10 %
Monocytes Absolute: 0.3 10*3/uL (ref 0.2–1.0)
NEUTROS ABS: 1.8 10*3/uL (ref 1.4–6.5)
Neutrophils Relative %: 62 %
Platelets: 139 10*3/uL — ABNORMAL LOW (ref 150–440)
RBC: 3.8 MIL/uL — ABNORMAL LOW (ref 4.40–5.90)
RDW: 17.3 % — AB (ref 11.5–14.5)
WBC: 2.9 10*3/uL — ABNORMAL LOW (ref 3.8–10.6)

## 2017-02-27 MED ORDER — DOXYCYCLINE HYCLATE 100 MG PO CAPS: 100.0000 mg | ORAL_CAPSULE | Freq: Two times a day (BID) | ORAL | 0 refills | Status: AC

## 2017-02-27 NOTE — ED Triage Notes (Signed)
Pt removed multiple ticks from his right leg 10 days ago. Now c/o areas to right leg that are red, edematous, and painful. No other body areas involved.

## 2017-02-27 NOTE — ED Provider Notes (Signed)
HPI  SUBJECTIVE:  Corey Perry is a 71 y.o. male who presents with a rash on his right lower extremity after removing 10 black small engorged tick's about 10 days ago after working in tall weeds. He states that they were on for at least 24 hours. He denies fevers, bodyaches, headaches, flulike symptoms, joint aches. He reports that the rash on his medial right lower extremity is painful described as tightness with swelling. He tried Neosporin and alcohol without improvement of symptoms. No aggravating factors. He has a past medical history of hypertension, chronic kidney disease stage III, esophageal cancer, MI, coronary artery disease status post 3-4 stents. He is on Plavix and aspirin. Also per chart review he has a history of thrombocytopenia, leukopenia.    Past Medical History:  Diagnosis Date  . Abnormal white blood cell (WBC) count    seeing Dr. [name] at [place] on 10/25  . Atrial fibrillation ([place])   . BPH (benign prostatic hypertrophy)   . Bradycardia   . CAD (coronary artery disease)   . Dysrhythmia    bradycardia - sees Dr. Humphrey Rolls  . Esophageal cancer ([place]) 05/2016  . GERD (gastroesophageal reflux disease)   . History of kidney stones   . Hyperlipidemia   . Hypertension   . Hypothyroidism   . Myocardial infarction ([place]) 2005  . Sleep apnea    mild-NO CPAP  . Spinal stenosis    lumbar    Past Surgical History:  Procedure Laterality Date  . BACK SURGERY    . CARDIAC CATHETERIZATION Left 03/12/2016   Procedure: Left Heart Cath and Coronary Angiography;  Surgeon: [name], MD;  Location: [place] [facility];  Service: Cardiovascular;  Laterality: Left;  . CARDIAC CATHETERIZATION N/A 03/12/2016   Procedure: Coronary Stent Intervention;  Surgeon: Yolonda Kida, MD;  Location: [place] [facility];  Service: Cardiovascular;  Laterality: N/A;  . COLONOSCOPY WITH PROPOFOL N/A 07/13/2015   Procedure: COLONOSCOPY WITH PROPOFOL;  Surgeon: Lucilla Lame, MD;   Location: [place];  Service: Endoscopy;  Laterality: N/A;  . CORONARY ANGIOPLASTY  10/05 and 12/05   4 DES placed  . ESOPHAGECTOMY  10/07/2016   @ [facility]  . ESOPHAGOGASTRODUODENOSCOPY (EGD) WITH PROPOFOL N/A 06/14/2016   Procedure: ESOPHAGOGASTRODUODENOSCOPY (EGD) WITH PROPOFOL;  Surgeon: [name], MD;  Location: [facility];  Service: Endoscopy;  Laterality: N/A;  . HERNIA REPAIR     x3  . POLYPECTOMY  07/13/2015   Procedure: POLYPECTOMY;  Surgeon: Lucilla Lame, MD;  Location: [place];  Service: Endoscopy;;  . PORT-A-CATH REMOVAL N/A 01/16/2017   Procedure: REMOVAL PORT-A-CATH;  Surgeon: Olean Ree, MD;  Location: [facility];  Service: General;  Laterality: N/A;  . PORTACATH PLACEMENT N/A 07/08/2016   Procedure: INSERTION PORT-A-CATH;  Surgeon: Olean Ree, MD;  Location: [facility];  Service: General;  Laterality: N/A;    Family History  Problem Relation Age of Onset  . Hypertension Mother   . Dementia Mother   . Heart disease Father   . Hypertension Father   . Kidney disease Father   . Hypertension Brother   . Heart disease Brother   . Diabetes Son   . Hypertension Son   . Hyperlipidemia Son   . Hyperlipidemia Daughter   . Hypertension Daughter     Social History  Substance Use Topics  . Smoking status: Former Smoker    Packs/day: 1.00    Years: 40.00    Types: Cigarettes    Quit date:  07/16/2004  . Smokeless tobacco: Former Systems developer    Types: Chew  . Alcohol use No    No current facility-administered medications for this encounter.   Current Outpatient Prescriptions:  .  aspirin 81 MG tablet, Take 81 mg by mouth daily. AM, Disp: , Rfl:  .  clopidogrel (PLAVIX) 75 MG tablet, Take 1 tablet (75 mg total) by mouth daily., Disp: 90 tablet, Rfl: 2 .  isosorbide mononitrate (IMDUR) 30 MG 24 hr tablet, Take 30 mg by mouth daily. AM, Disp: , Rfl: 5 .  levothyroxine (SYNTHROID, LEVOTHROID) 50 MCG tablet, Take 1 tablet (50 mcg total) by mouth  daily., Disp: 90 tablet, Rfl: 2 .  lisinopril (PRINIVIL,ZESTRIL) 10 MG tablet, Take 1 tablet (10 mg total) by mouth every morning., Disp: 90 tablet, Rfl: 2 .  multivitamin-iron-minerals-folic acid (CENTRUM) chewable tablet, Chew 1 tablet by mouth daily., Disp: , Rfl:  .  pantoprazole (PROTONIX) 20 MG tablet, Take 1 tablet (20 mg total) by mouth daily. (Patient taking differently: Take 20 mg by mouth every morning. ), Disp: 90 tablet, Rfl: 4 .  simvastatin (ZOCOR) 40 MG tablet, Take 1 tablet (40 mg total) by mouth daily. (Patient taking differently: Take 40 mg by mouth every morning. ), Disp: 90 tablet, Rfl: 4 .  acetaminophen (TYLENOL) 500 MG tablet, Take 500 mg by mouth every 6 (six) hours as needed for mild pain or moderate pain., Disp: , Rfl:  .  doxycycline (VIBRAMYCIN) 100 MG capsule, Take 1 capsule (100 mg total) by mouth 2 (two) times daily., Disp: 20 capsule, Rfl: 0 .  nitroGLYCERIN (NITROSTAT) 0.4 MG SL tablet, Place 1 tablet under the tongue every 5 (five) minutes as needed for chest pain. , Disp: , Rfl: 98  No Known Allergies   ROS  As noted in HPI.   Physical Exam  BP (!) 158/75 (BP Location: Left Arm)   Pulse 62   Temp 97.8 F (36.6 C) (Oral)   Resp 16   Ht 5\' 9"  (1.753 m)   Wt 166 lb (75.3 kg)   SpO2 98%   BMI 24.51 kg/m   Constitutional: Well developed, well nourished, no acute distress Eyes:  EOMI, conjunctiva normal bilaterally HENT: Normocephalic, atraumatic,mucus membranes moist Respiratory: Normal inspiratory effort Cardiovascular: Normal rate GI: nondistended skin: +9 x 9.5 cm nontender, non-blanchable petechial bull's-eye rash on his medial light lower extremity see pictures. Also several hyperpigmented nontender areas consistent with tick bites scattered over his right lower extremity. No rash on the palms of his hands       Musculoskeletal: no deformities Neurologic: Alert & oriented x 3, no focal neuro deficits Psychiatric: Speech and behavior  appropriate   ED Course   Medications - No data to display  Orders Placed This Encounter  Procedures  . CBC with Differential    Standing Status:   Standing    Number of Occurrences:   1  . [place] spotted fvr abs pnl(IgG+IgM)    Standing Status:   Standing    Number of Occurrences:   1  . B. burgdorfi antibodies    Standing Status:   Standing    Number of Occurrences:   1    Results for orders placed or performed during the hospital encounter of 02/27/17 (from the past 24 hour(s))  CBC with Differential     Status: Abnormal   Collection Time: 02/27/17  4:09 PM  Result Value Ref Range   WBC 2.9 (L) 3.8 - 10.6 K/uL   RBC  3.80 (L) 4.40 - 5.90 MIL/uL   Hemoglobin 11.3 (L) 13.0 - 18.0 g/dL   HCT 35.0 (L) 40.0 - 52.0 %   MCV 92.0 80.0 - 100.0 fL   MCH 29.7 26.0 - 34.0 pg   MCHC 32.3 32.0 - 36.0 g/dL   RDW 17.3 (H) 11.5 - 14.5 %   Platelets 139 (L) 150 - 440 K/uL   Neutrophils Relative % 62 %   Neutro Abs 1.8 1.4 - 6.5 K/uL   Lymphocytes Relative 22 %   Lymphs Abs 0.6 (L) 1.0 - 3.6 K/uL   Monocytes Relative 10 %   Monocytes Absolute 0.3 0.2 - 1.0 K/uL   Eosinophils Relative 5 %   Eosinophils Absolute 0.1 0 - 0.7 K/uL   Basophils Relative 1 %   Basophils Absolute 0.0 0 - 0.1 K/uL   No results found.  ED Clinical Impression  Tick bite, initial encounter  Rash and nonspecific skin eruption  Leukopenia, unspecified type  Thrombocytopenia [facility])   ED Assessment/Plan  We'll check CBC because of the petechial rash, but also [facility] spotted and Lyme disease titers. Plan to send home with doxycycline for 10 days to treat for presumed tick borne illness. will extend the doxycycline if his Lyme titers come back positive. It does not need to be renally dosed  Previous outside labs reviewed. Last wbc count 4.8, platelets 153 on April 2018. BMP on 01/22/17 unremarkable with normal kidney function.  Patient with leukopenia and mild thrombocytopenia.Pt to f/u with   Hematologist, Dr. [name] for this. He has an appt with him already on 6/26, but advised patient to call him and notify the hematologist of these recent numbers.  Discussed labs, MDM, plan and followup with patient. Discussed sn/sx that should prompt return to the ED. Patient agrees with plan.   Meds ordered this encounter  Medications  . doxycycline (VIBRAMYCIN) 100 MG capsule    Sig: Take 1 capsule (100 mg total) by mouth 2 (two) times daily.    Dispense:  20 capsule    Refill:  0    *This clinic note was created using [employer]. Therefore, there may be occasional mistakes despite careful proofreading.  ?   Melynda Ripple, MD 02/27/17 1649

## 2017-02-28 LAB — B. BURGDORFI ANTIBODIES

## 2017-03-01 LAB — ROCKY MTN SPOTTED FVR ABS PNL(IGG+IGM)
RMSF IGG: NEGATIVE
RMSF IGM: 0.87 {index} (ref 0.00–0.89)

## 2017-03-10 NOTE — Progress Notes (Signed)
[place]  [phone] [phone]  ID: Corey Perry OB: May 05, 1946  MR#: [id number]  OFB#:[id number]  Patient Care Team: [name], MD as PCP - General (Family Medicine) [name], MD as PCP - Family Medicine (Family Medicine) [name], MD (Specialist) [name], MD as Referring Physician (Internal Medicine) [name], MD as Consulting Physician (Neurosurgery)  CHIEF COMPLAINT: Stage IIb adenocarcinoma of the lower third of the esophagus.  INTERVAL HISTORY: Patient returns to clinic today for further evaluation and discussion of his imaging results. He continues to feel well and is back to his baseline.  His swallowing is much improved and he reports being able to tolerate solid foods. He denies any recent fevers or illnesses. He has no neurologic complaints. He denies any chest pain or shortness of breath. He denies any nausea, vomiting, constipation, or diarrhea. He reports nocturia 2-3 times per night which is chronic, no other urinary complaints. Patient offers no specific complaints today.  REVIEW OF SYSTEMS:   Review of Systems  Constitutional: Negative for fever, malaise/fatigue and weight loss.  HENT: Negative.   Respiratory: Negative.  Negative for cough and shortness of breath.   Cardiovascular: Negative.  Negative for chest pain and leg swelling.  Gastrointestinal: Negative for abdominal pain, blood in stool, constipation, diarrhea, melena, nausea and vomiting.  Genitourinary: Negative for frequency and urgency.       Nocturia  Musculoskeletal: Negative.   Neurological: Negative.  Negative for tingling, weakness and headaches.  Psychiatric/Behavioral: The patient does not have insomnia.     As per HPI. Otherwise, a complete review of systems is negative.  PAST MEDICAL HISTORY: Past Medical History:  Diagnosis Date  . Abnormal white blood cell (WBC) count    seeing Dr. [name] at [place] on  10/25  . Atrial fibrillation ([place])   . BPH (benign prostatic hypertrophy)   . Bradycardia   . CAD (coronary artery disease)   . Dysrhythmia    bradycardia - sees Dr. Humphrey Rolls  . Esophageal cancer ([place]) 05/2016  . GERD (gastroesophageal reflux disease)   . History of kidney stones   . Hyperlipidemia   . Hypertension   . Hypothyroidism   . Myocardial infarction ([place]) 2005  . Sleep apnea    mild-NO CPAP  . Spinal stenosis    lumbar    PAST SURGICAL HISTORY: Past Surgical History:  Procedure Laterality Date  . BACK SURGERY    . CARDIAC CATHETERIZATION Left 03/12/2016   Procedure: Left Heart Cath and Coronary Angiography;  Surgeon: [name], MD;  Location: [place] [facility];  Service: Cardiovascular;  Laterality: Left;  . CARDIAC CATHETERIZATION N/A 03/12/2016   Procedure: Coronary Stent Intervention;  Surgeon: Yolonda Kida, MD;  Location: [employer] [facility];  Service: Cardiovascular;  Laterality: N/A;  . COLONOSCOPY WITH PROPOFOL N/A 07/13/2015   Procedure: COLONOSCOPY WITH PROPOFOL;  Surgeon: Lucilla Lame, MD;  Location: [place];  Service: Endoscopy;  Laterality: N/A;  . CORONARY ANGIOPLASTY  10/05 and 12/05   4 DES placed  . ESOPHAGECTOMY  10/07/2016   @ [facility]  . ESOPHAGOGASTRODUODENOSCOPY (EGD) WITH PROPOFOL N/A 06/14/2016   Procedure: ESOPHAGOGASTRODUODENOSCOPY (EGD) WITH PROPOFOL;  Surgeon: [name], MD;  Location[facility];  Service: Endoscopy;  Laterality: N/A;  . HERNIA REPAIR     x3  . POLYPECTOMY  07/13/2015   Procedure: POLYPECTOMY;  Surgeon: Lucilla Lame, MD;  Location: [place];  Service: Endoscopy;;  . PORT-A-CATH REMOVAL  N/A 01/16/2017   Procedure: REMOVAL PORT-A-CATH;  Surgeon: Olean Ree, MD;  Location: [facility];  Service: General;  Laterality: N/A;  . PORTACATH PLACEMENT N/A 07/08/2016   Procedure: INSERTION PORT-A-CATH;  Surgeon: Olean Ree, MD;  Location: [facility];  Service: General;  Laterality: N/A;     FAMILY HISTORY Family History  Problem Relation Age of Onset  . Hypertension Mother   . Dementia Mother   . Heart disease Father   . Hypertension Father   . Kidney disease Father   . Hypertension Brother   . Heart disease Brother   . Diabetes Son   . Hypertension Son   . Hyperlipidemia Son   . Hyperlipidemia Daughter   . Hypertension Daughter        ADVANCED DIRECTIVES:    HEALTH MAINTENANCE: Social History  Substance Use Topics  . Smoking status: Former Smoker    Packs/day: 1.00    Years: 40.00    Types: Cigarettes    Quit date: 07/16/2004  . Smokeless tobacco: Former Systems developer    Types: Chew  . Alcohol use No     Colonoscopy:  PAP:  Bone density:  Lipid panel:  No Known Allergies  Current Outpatient Prescriptions  Medication Sig Dispense Refill  . acetaminophen (TYLENOL) 500 MG tablet Take 500 mg by mouth every 6 (six) hours as needed for mild pain or moderate pain.    Marland Kitchen aspirin 81 MG tablet Take 81 mg by mouth daily. AM    . clopidogrel (PLAVIX) 75 MG tablet Take 1 tablet (75 mg total) by mouth daily. 90 tablet 2  . isosorbide mononitrate (IMDUR) 30 MG 24 hr tablet Take 30 mg by mouth daily. AM  5  . levothyroxine (SYNTHROID, LEVOTHROID) 50 MCG tablet Take 1 tablet (50 mcg total) by mouth daily. 90 tablet 2  . lisinopril (PRINIVIL,ZESTRIL) 10 MG tablet Take 1 tablet (10 mg total) by mouth every morning. 90 tablet 2  . multivitamin-iron-minerals-folic acid (CENTRUM) chewable tablet Chew 1 tablet by mouth daily.    . nitroGLYCERIN (NITROSTAT) 0.4 MG SL tablet Place 1 tablet under the tongue every 5 (five) minutes as needed for chest pain.   98  . pantoprazole (PROTONIX) 20 MG tablet Take 1 tablet (20 mg total) by mouth daily. (Patient taking differently: Take 20 mg by mouth every morning. ) 90 tablet 4  . simvastatin (ZOCOR) 40 MG tablet Take 1 tablet (40 mg total) by mouth daily. (Patient taking differently: Take 40 mg by mouth every morning. ) 90 tablet 4    No current facility-administered medications for this visit.     OBJECTIVE: Vitals:   03/11/17 1203  BP: 134/80  Pulse: (!) 57  Resp: 20  Temp: (!) 96.2 F (35.7 C)     Body mass index is 24.35 kg/m.    ECOG FS:0 - Asymptomatic  General: Well-developed, well-nourished, no acute distress. Eyes: Pink conjunctiva, anicteric sclera. Lungs: Clear to auscultation bilaterally. Heart: Regular rate and rhythm. No rubs, murmurs, or gallops. Abdomen: Soft, nontender, nondistended. No organomegaly noted, normoactive bowel sounds. Musculoskeletal: No edema, cyanosis, or clubbing. Neuro: Alert, answering all questions appropriately. Cranial nerves grossly intact. Skin: No rashes or petechiae noted. Psych: Normal affect.   LAB RESULTS:  Lab Results  Component Value Date   NA 138 03/11/2017   K 4.2 03/11/2017   CL 105 03/11/2017   CO2 26 03/11/2017   GLUCOSE 105 (H) 03/11/2017   BUN 17 03/11/2017   CREATININE 1.30 (H) 03/11/2017   CALCIUM  9.4 03/11/2017   PROT 7.6 03/11/2017   ALBUMIN 3.9 03/11/2017   AST 23 03/11/2017   ALT 16 (L) 03/11/2017   ALKPHOS 61 03/11/2017   BILITOT 0.8 03/11/2017   GFRNONAA 54 (L) 03/11/2017   GFRAA >60 03/11/2017    Lab Results  Component Value Date   WBC 3.8 03/11/2017   NEUTROABS 2.5 03/11/2017   HGB 11.6 (L) 03/11/2017   HCT 34.2 (L) 03/11/2017   MCV 91.1 03/11/2017   PLT 140 (L) 03/11/2017     STUDIES: No results found.  ASSESSMENT: Pathologic Stage IIb (T3, N0, M0) adenocarcinoma of the lower third of the esophagus.  PLAN:    1. Stage IIb adenocarcinoma of the lower third of the esophagus: Patient had EUS completed at [facility] confirming the stage and diagnosis. He completed 6 cycles of weekly carboplatinum and Taxol completed on August 21, 2016 and daily XRT completed on August 27, 2016. Patient had his esophagectomy on October 07, 2016. Imaging from [facility] did not reveal any evidence of progressive or  recurrent disease. Return to clinic in 6 months after his repeat imaging for further evaluation.  2. Thrombocytopenia: Mild. Previously entire workup did not reveal a distinct etiology. No bone marrow biopsy has been performed to this point. Continue to monitor closely.  4. Renal insufficiency: Mild, monitor.  5. Nocturia: Consider urology referral if symptoms continue to be disruptive.   Patient expressed understanding and was in agreement with this plan. He also understands that He can call clinic at any time with any questions, concerns, or complaints.    [name], MD 03/14/17 4:28 PM

## 2017-03-11 ENCOUNTER — Inpatient Hospital Stay: Payer: Medicare Other | Attending: Oncology | Admitting: Oncology

## 2017-03-11 ENCOUNTER — Inpatient Hospital Stay: Payer: Medicare Other

## 2017-03-11 VITALS — BP 134/80 | HR 57 | Temp 96.2°F | Resp 20 | Wt 164.9 lb

## 2017-03-11 DIAGNOSIS — N400 Enlarged prostate without lower urinary tract symptoms: Secondary | ICD-10-CM | POA: Diagnosis not present

## 2017-03-11 DIAGNOSIS — R351 Nocturia: Secondary | ICD-10-CM | POA: Insufficient documentation

## 2017-03-11 DIAGNOSIS — D696 Thrombocytopenia, unspecified: Secondary | ICD-10-CM | POA: Diagnosis not present

## 2017-03-11 DIAGNOSIS — Z79899 Other long term (current) drug therapy: Secondary | ICD-10-CM | POA: Diagnosis not present

## 2017-03-11 DIAGNOSIS — Z7982 Long term (current) use of aspirin: Secondary | ICD-10-CM

## 2017-03-11 DIAGNOSIS — Z87891 Personal history of nicotine dependence: Secondary | ICD-10-CM | POA: Diagnosis not present

## 2017-03-11 DIAGNOSIS — N289 Disorder of kidney and ureter, unspecified: Secondary | ICD-10-CM | POA: Diagnosis not present

## 2017-03-11 DIAGNOSIS — M48061 Spinal stenosis, lumbar region without neurogenic claudication: Secondary | ICD-10-CM

## 2017-03-11 DIAGNOSIS — I4891 Unspecified atrial fibrillation: Secondary | ICD-10-CM

## 2017-03-11 DIAGNOSIS — G4730 Sleep apnea, unspecified: Secondary | ICD-10-CM

## 2017-03-11 DIAGNOSIS — Z8501 Personal history of malignant neoplasm of esophagus: Secondary | ICD-10-CM | POA: Diagnosis not present

## 2017-03-11 DIAGNOSIS — I10 Essential (primary) hypertension: Secondary | ICD-10-CM | POA: Diagnosis not present

## 2017-03-11 DIAGNOSIS — K219 Gastro-esophageal reflux disease without esophagitis: Secondary | ICD-10-CM | POA: Diagnosis not present

## 2017-03-11 DIAGNOSIS — E039 Hypothyroidism, unspecified: Secondary | ICD-10-CM | POA: Diagnosis not present

## 2017-03-11 DIAGNOSIS — R001 Bradycardia, unspecified: Secondary | ICD-10-CM | POA: Diagnosis not present

## 2017-03-11 DIAGNOSIS — I2510 Atherosclerotic heart disease of native coronary artery without angina pectoris: Secondary | ICD-10-CM | POA: Diagnosis not present

## 2017-03-11 DIAGNOSIS — C155 Malignant neoplasm of lower third of esophagus: Secondary | ICD-10-CM

## 2017-03-11 DIAGNOSIS — Z87442 Personal history of urinary calculi: Secondary | ICD-10-CM | POA: Insufficient documentation

## 2017-03-11 DIAGNOSIS — I252 Old myocardial infarction: Secondary | ICD-10-CM | POA: Diagnosis not present

## 2017-03-11 DIAGNOSIS — E785 Hyperlipidemia, unspecified: Secondary | ICD-10-CM

## 2017-03-11 LAB — CBC WITH DIFFERENTIAL/PLATELET
Basophils Absolute: 0 10*3/uL (ref 0–0.1)
Basophils Relative: 1 %
EOS ABS: 0.2 10*3/uL (ref 0–0.7)
Eosinophils Relative: 6 %
HCT: 34.2 % — ABNORMAL LOW (ref 40.0–52.0)
HEMOGLOBIN: 11.6 g/dL — AB (ref 13.0–18.0)
LYMPHS ABS: 0.6 10*3/uL — AB (ref 1.0–3.6)
LYMPHS PCT: 16 %
MCH: 30.8 pg (ref 26.0–34.0)
MCHC: 33.8 g/dL (ref 32.0–36.0)
MCV: 91.1 fL (ref 80.0–100.0)
MONOS PCT: 11 %
Monocytes Absolute: 0.4 10*3/uL (ref 0.2–1.0)
NEUTROS PCT: 66 %
Neutro Abs: 2.5 10*3/uL (ref 1.4–6.5)
Platelets: 140 10*3/uL — ABNORMAL LOW (ref 150–440)
RBC: 3.75 MIL/uL — ABNORMAL LOW (ref 4.40–5.90)
RDW: 17.4 % — ABNORMAL HIGH (ref 11.5–14.5)
WBC: 3.8 10*3/uL (ref 3.8–10.6)

## 2017-03-11 LAB — COMPREHENSIVE METABOLIC PANEL
ALK PHOS: 61 U/L (ref 38–126)
ALT: 16 U/L — ABNORMAL LOW (ref 17–63)
ANION GAP: 7 (ref 5–15)
AST: 23 U/L (ref 15–41)
Albumin: 3.9 g/dL (ref 3.5–5.0)
BILIRUBIN TOTAL: 0.8 mg/dL (ref 0.3–1.2)
BUN: 17 mg/dL (ref 6–20)
CALCIUM: 9.4 mg/dL (ref 8.9–10.3)
CO2: 26 mmol/L (ref 22–32)
CREATININE: 1.3 mg/dL — AB (ref 0.61–1.24)
Chloride: 105 mmol/L (ref 101–111)
GFR, EST NON AFRICAN AMERICAN: 54 mL/min — AB (ref >60)
Glucose, Bld: 105 mg/dL — ABNORMAL HIGH (ref 65–99)
Potassium: 4.2 mmol/L (ref 3.5–5.1)
SODIUM: 138 mmol/L (ref 135–145)
TOTAL PROTEIN: 7.6 g/dL (ref 6.5–8.1)

## 2017-03-11 NOTE — Progress Notes (Signed)
Patient recently seen for follow up at [facility] with CT scans. Patient denies any concerns today.

## 2017-04-01 ENCOUNTER — Telehealth: Payer: Self-pay | Admitting: Family Medicine

## 2017-04-01 NOTE — Telephone Encounter (Signed)
Pt agreed to contact oncologist. Wife did state he has an appetite but everything just goes straight through him.

## 2017-04-01 NOTE — Telephone Encounter (Signed)
Spoke to wife. Pt has been suffering from esophageal cancer. He is doing better, but he has chronic diahrrea and is continuing to lose weight. Pt has went from ~ 207 to about 160. Wife would like to know if something can be called in to help with this. Please advise.

## 2017-04-01 NOTE — Telephone Encounter (Signed)
They really should talk to his oncologist about this. If that is a problem, we can give him some medicine to help stimulate his appetite- but his cancer doctor should do that as well.

## 2017-04-03 DIAGNOSIS — I2510 Atherosclerotic heart disease of native coronary artery without angina pectoris: Secondary | ICD-10-CM | POA: Diagnosis not present

## 2017-04-03 DIAGNOSIS — R079 Chest pain, unspecified: Secondary | ICD-10-CM | POA: Diagnosis not present

## 2017-04-03 DIAGNOSIS — E785 Hyperlipidemia, unspecified: Secondary | ICD-10-CM | POA: Diagnosis not present

## 2017-04-03 DIAGNOSIS — I10 Essential (primary) hypertension: Secondary | ICD-10-CM | POA: Diagnosis not present

## 2017-04-07 NOTE — Telephone Encounter (Addendum)
Patient was transferred to provider for telephone conversation.    Phone call Discussed with patient weight loss hasn't gotten back with oncology yet. Discuss use of probiotics to help with diarrhea and is not helping get back with oncology.

## 2017-04-07 NOTE — Telephone Encounter (Signed)
Call pt 

## 2017-04-16 DIAGNOSIS — H2513 Age-related nuclear cataract, bilateral: Secondary | ICD-10-CM | POA: Diagnosis not present

## 2017-04-22 ENCOUNTER — Ambulatory Visit: Payer: Medicare Other | Attending: Radiation Oncology | Admitting: Radiation Oncology

## 2017-07-02 ENCOUNTER — Ambulatory Visit (INDEPENDENT_AMBULATORY_CARE_PROVIDER_SITE_OTHER): Payer: Medicare Other

## 2017-07-02 VITALS — BP 137/66 | HR 42 | Temp 97.8°F | Resp 16 | Ht 69.0 in | Wt 162.6 lb

## 2017-07-02 DIAGNOSIS — Z23 Encounter for immunization: Secondary | ICD-10-CM

## 2017-07-02 DIAGNOSIS — Z0000 Encounter for general adult medical examination without abnormal findings: Secondary | ICD-10-CM

## 2017-07-02 NOTE — Progress Notes (Signed)
Subjective:   Corey Perry is a 71 y.o. male who presents for Medicare Annual/Subsequent preventive examination.  Review of Systems:  Cardiac Risk Factors include: male gender;advanced age (>67men, >46 women);dyslipidemia;hypertension     Objective:    Vitals: BP 137/66 (BP Location: Left Arm, Patient Position: Sitting)   Pulse (!) 42   Temp 97.8 F (36.6 C) (Oral)   Resp 16   Ht 5\' 9"  (1.753 m)   Wt 162 lb 9.6 oz (73.8 kg)   BMI 24.01 kg/m   Body mass index is 24.01 kg/m.  Tobacco History  Smoking Status  . Former Smoker  . Packs/day: 1.00  . Years: 40.00  . Types: Cigarettes  . Quit date: 07/16/2004  Smokeless Tobacco  . Former Systems developer  . Types: Chew     Counseling given: Not Answered   Past Medical History:  Diagnosis Date  . Abnormal white blood cell (WBC) count    seeing Dr. [name] at [facility] on 10/25  . Atrial fibrillation ([place])   . BPH (benign prostatic hypertrophy)   . Bradycardia   . CAD (coronary artery disease)   . Dysrhythmia    bradycardia - sees Dr. Humphrey Rolls  . Esophageal cancer ([place]) 05/2016  . GERD (gastroesophageal reflux disease)   . History of kidney stones   . Hyperlipidemia   . Hypertension   . Hypothyroidism   . Myocardial infarction ([place]) 2005  . Sleep apnea    mild-NO CPAP  . Spinal stenosis    lumbar   Past Surgical History:  Procedure Laterality Date  . BACK SURGERY    . CARDIAC CATHETERIZATION Left 03/12/2016   Procedure: Left Heart Cath and Coronary Angiography;  Surgeon: [name], MD;  Location: [facility];  Service: Cardiovascular;  Laterality: Left;  . CARDIAC CATHETERIZATION N/A 03/12/2016   Procedure: Coronary Stent Intervention;  Surgeon: Yolonda Kida, MD;  Location: [facility];  Service: Cardiovascular;  Laterality: N/A;  . COLONOSCOPY WITH PROPOFOL N/A 07/13/2015   Procedure: COLONOSCOPY WITH PROPOFOL;  Surgeon: Lucilla Lame, MD;  Location: [facility];  Service: Endoscopy;   Laterality: N/A;  . CORONARY ANGIOPLASTY  10/05 and 12/05   4 DES placed  . ESOPHAGECTOMY  10/07/2016   @ [facility]  . ESOPHAGOGASTRODUODENOSCOPY (EGD) WITH PROPOFOL N/A 06/14/2016   Procedure: ESOPHAGOGASTRODUODENOSCOPY (EGD) WITH PROPOFOL;  Surgeon: [name], MD;  Location: [facility];  Service: Endoscopy;  Laterality: N/A;  . HERNIA REPAIR     x3  . POLYPECTOMY  07/13/2015   Procedure: POLYPECTOMY;  Surgeon: Lucilla Lame, MD;  Location: [place];  Service: Endoscopy;;  . PORT-A-CATH REMOVAL N/A 01/16/2017   Procedure: REMOVAL PORT-A-CATH;  Surgeon: Olean Ree, MD;  Location: [facility];  Service: General;  Laterality: N/A;  . PORTACATH PLACEMENT N/A 07/08/2016   Procedure: INSERTION PORT-A-CATH;  Surgeon: Olean Ree, MD;  Location: [facility];  Service: General;  Laterality: N/A;   Family History  Problem Relation Age of Onset  . Hypertension Mother   . Dementia Mother   . Heart disease Father   . Hypertension Father   . Kidney disease Father   . Hypertension Brother   . Heart disease Brother   . Diabetes Son   . Hypertension Son   . Hyperlipidemia Son   . Hyperlipidemia Daughter   . Hypertension Daughter    History  Sexual Activity  . Sexual activity: Not on file    Outpatient Encounter Prescriptions as of 07/02/2017  Medication Sig  . acetaminophen (TYLENOL) 500 MG tablet Take 500 mg by mouth every 6 (six) hours as needed for mild pain or moderate pain.  Marland Kitchen aspirin 81 MG tablet Take 81 mg by mouth daily. AM  . clopidogrel (PLAVIX) 75 MG tablet Take 1 tablet (75 mg total) by mouth daily.  Marland Kitchen levothyroxine (SYNTHROID, LEVOTHROID) 50 MCG tablet Take 1 tablet (50 mcg total) by mouth daily.  Marland Kitchen lisinopril (PRINIVIL,ZESTRIL) 10 MG tablet Take 1 tablet (10 mg total) by mouth every morning.  . multivitamin-iron-minerals-folic acid (CENTRUM) chewable tablet Chew 1 tablet by mouth daily.  . pantoprazole (PROTONIX) 20 MG tablet Take 1 tablet (20 mg total) by mouth  daily. (Patient taking differently: Take 20 mg by mouth every morning. )  . simvastatin (ZOCOR) 40 MG tablet Take 1 tablet (40 mg total) by mouth daily. (Patient taking differently: Take 40 mg by mouth every morning. )  . isosorbide mononitrate (IMDUR) 30 MG 24 hr tablet Take 30 mg by mouth daily. AM  . nitroGLYCERIN (NITROSTAT) 0.4 MG SL tablet Place 1 tablet under the tongue every 5 (five) minutes as needed for chest pain.    No facility-administered encounter medications on file as of 07/02/2017.     Activities of Daily Living In your present state of health, do you have any difficulty performing the following activities: 07/02/2017 01/13/2017  Hearing? Y N  Vision? N N  Difficulty concentrating or making decisions? Y N  Walking or climbing stairs? N N  Dressing or bathing? N N  Doing errands, shopping? N N  Preparing Food and eating ? N -  Using the Toilet? N -  In the past six months, have you accidently leaked urine? N -  Do you have problems with loss of bowel control? N -  Managing your Medications? N -  Managing your Finances? N -  Housekeeping or managing your Housekeeping? N -  Some recent data might be hidden    Patient Care Team: [name], MD as PCP - General (Family Medicine) [name], MD as PCP - Family Medicine (Family Medicine) [name], MD as Consulting Physician (Oncology) [name], MD as Referring Physician   Assessment:     Exercise Activities and Dietary recommendations Current Exercise Habits: The patient does not participate in regular exercise at present, Exercise limited by: None identified  Goals    None     Fall Risk Fall Risk  07/02/2017 01/22/2017 06/24/2016 06/21/2015  Falls in the past year? No No No No   Depression Screen PHQ 2/9 Scores 07/02/2017 06/24/2016 06/21/2015  PHQ - 2 Score 0 0 0    Cognitive Function     6CIT Screen 07/02/2017  What Year? 0 points  What month? 0 points  What time? 0 points    Count back from 20 0 points  Months in reverse 0 points  Repeat phrase 2 points  Total Score 2    Immunization History  Administered Date(s) Administered  . Influenza, High Dose Seasonal PF 06/24/2016, 07/02/2017  . Influenza,inj,Quad PF,6+ Mos 06/21/2015  . Influenza-Unspecified 10/18/2016  . Pneumococcal Conjugate-13 06/14/2014  . Pneumococcal-Unspecified 04/09/2005, 09/28/2009  . Tdap 05/26/2013  . Zoster 06/08/2008   Screening Tests Health Maintenance  Topic Date Due  . Hepatitis C Screening  12/19/2017 (Originally [birth year])  . COLONOSCOPY  07/12/2020  . TETANUS/TDAP  05/27/2023  . INFLUENZA VACCINE  Completed      Plan:    I have personally reviewed and addressed  the Medicare Annual Wellness questionnaire and have noted the following in the patient's chart:  A. Medical and social history B. Use of alcohol, tobacco or illicit drugs  C. Current medications and supplements D. Functional ability and status E.  Nutritional status F.  Physical activity G. Advance directives H. List of other physicians I.  Hospitalizations, surgeries, and ER visits in previous 12 months J.  [place] such as hearing and vision if needed, cognitive and depression L. Referrals and appointments   In addition, I have reviewed and discussed with patient certain preventive protocols, quality metrics, and best practice recommendations. A written personalized care plan for preventive services as well as general preventive health recommendations were provided to patient.   Signed,  Tyler Aas, LPN Nurse Health Advisor   MD Recommendations: Patient out of Imdur, unsure of who prescribed this.

## 2017-07-02 NOTE — Patient Instructions (Addendum)
Corey Perry , Thank you for taking time to come for your Medicare Wellness Visit. I appreciate your ongoing commitment to your health goals. Please review the following plan we discussed and let me know if I can assist you in the future.   Screening recommendations/referrals: Colonoscopy: completed 01/22/2017 Recommended yearly ophthalmology/optometry visit for glaucoma screening and checkup Recommended yearly dental visit for hygiene and checkup  Vaccinations: Influenza vaccine: done today  Pneumococcal vaccine: up to date Tdap vaccine: up to date Shingles vaccine: up to date  Advanced directives: Please bring a copy of your health care power of attorney and living will to the office at your convenience.  Conditions/risks identified: none  Next appointment: Follow up on 07/29/2017 at 1:00pm with Dr.Crissman. Follow up in one year for your annual wellness exam.   Preventive Care 71 Years and Older, Male Preventive care refers to lifestyle choices and visits with your health care provider that can promote health and wellness. What does preventive care include?  A yearly physical exam. This is also called an annual well check.  Dental exams once or twice a year.  Routine eye exams. Ask your health care provider how often you should have your eyes checked.  Personal lifestyle choices, including:  Daily care of your teeth and gums.  Regular physical activity.  Eating a healthy diet.  Avoiding tobacco and drug use.  Limiting alcohol use.  Practicing safe sex.  Taking low doses of aspirin every day.  Taking vitamin and mineral supplements as recommended by your health care provider. What happens during an annual well check? The services and screenings done by your health care provider during your annual well check will depend on your age, overall health, lifestyle risk factors, and family history of disease. Counseling  Your health care provider may ask you questions  about your:  Alcohol use.  Tobacco use.  Drug use.  Emotional well-being.  Home and relationship well-being.  Sexual activity.  Eating habits.  History of falls.  Memory and ability to understand (cognition).  Work and work Statistician. Screening  You may have the following tests or measurements:  Height, weight, and BMI.  Blood pressure.  Lipid and cholesterol levels. These may be checked every 5 years, or more frequently if you are over 34 years old.  Skin check.  Lung cancer screening. You may have this screening every year starting at age 70 if you have a 30-pack-year history of smoking and currently smoke or have quit within the past 15 years.  Fecal occult blood test (FOBT) of the stool. You may have this test every year starting at age 47.  Flexible sigmoidoscopy or colonoscopy. You may have a sigmoidoscopy every 5 years or a colonoscopy every 10 years starting at age 65.  Prostate cancer screening. Recommendations will vary depending on your family history and other risks.  Hepatitis C blood test.  Hepatitis B blood test.  Sexually transmitted disease (STD) testing.  Diabetes screening. This is done by checking your blood sugar (glucose) after you have not eaten for a while (fasting). You may have this done every 1-3 years.  Abdominal aortic aneurysm (AAA) screening. You may need this if you are a current or former smoker.  Osteoporosis. You may be screened starting at age 43 if you are at high risk. Talk with your health care provider about your test results, treatment options, and if necessary, the need for more tests. Vaccines  Your health care provider may recommend certain vaccines, such as:  Influenza vaccine. This is recommended every year.  Tetanus, diphtheria, and acellular pertussis (Tdap, Td) vaccine. You may need a Td booster every 10 years.  Zoster vaccine. You may need this after age 44.  Pneumococcal 13-valent conjugate (PCV13)  vaccine. One dose is recommended after age 64.  Pneumococcal polysaccharide (PPSV23) vaccine. One dose is recommended after age 59. Talk to your health care provider about which screenings and vaccines you need and how often you need them. This information is not intended to replace advice given to you by your health care provider. Make sure you discuss any questions you have with your health care provider. Document Released: 09/29/2015 Document Revised: 05/22/2016 Document Reviewed: 07/04/2015 Elsevier Interactive Patient Education  2017 [place] Prevention in the Home Falls can cause injuries. They can happen to people of all ages. There are many things you can do to make your home safe and to help prevent falls. What can I do on the outside of my home?  Regularly fix the edges of walkways and driveways and fix any cracks.  Remove anything that might make you trip as you walk through a door, such as a raised step or threshold.  Trim any bushes or trees on the path to your home.  Use bright outdoor lighting.  Clear any walking paths of anything that might make someone trip, such as rocks or tools.  Regularly check to see if handrails are loose or broken. Make sure that both sides of any steps have handrails.  Any raised decks and porches should have guardrails on the edges.  Have any leaves, snow, or ice cleared regularly.  Use sand or salt on walking paths during winter.  Clean up any spills in your garage right away. This includes oil or grease spills. What can I do in the bathroom?  Use night lights.  Install grab bars by the toilet and in the tub and shower. Do not use towel bars as grab bars.  Use non-skid mats or decals in the tub or shower.  If you need to sit down in the shower, use a plastic, non-slip stool.  Keep the floor dry. Clean up any water that spills on the floor as soon as it happens.  Remove soap buildup in the tub or shower  regularly.  Attach bath mats securely with double-sided non-slip rug tape.  Do not have throw rugs and other things on the floor that can make you trip. What can I do in the bedroom?  Use night lights.  Make sure that you have a light by your bed that is easy to reach.  Do not use any sheets or blankets that are too big for your bed. They should not hang down onto the floor.  Have a firm chair that has side arms. You can use this for support while you get dressed.  Do not have throw rugs and other things on the floor that can make you trip. What can I do in the kitchen?  Clean up any spills right away.  Avoid walking on wet floors.  Keep items that you use a lot in easy-to-reach places.  If you need to reach something above you, use a strong step stool that has a grab bar.  Keep electrical cords out of the way.  Do not use floor polish or wax that makes floors slippery. If you must use wax, use non-skid floor wax.  Do not have throw rugs and other things on the floor that  can make you trip. What can I do with my stairs?  Do not leave any items on the stairs.  Make sure that there are handrails on both sides of the stairs and use them. Fix handrails that are broken or loose. Make sure that handrails are as long as the stairways.  Check any carpeting to make sure that it is firmly attached to the stairs. Fix any carpet that is loose or worn.  Avoid having throw rugs at the top or bottom of the stairs. If you do have throw rugs, attach them to the floor with carpet tape.  Make sure that you have a light switch at the top of the stairs and the bottom of the stairs. If you do not have them, ask someone to add them for you. What else can I do to help prevent falls?  Wear shoes that:  Do not have high heels.  Have rubber bottoms.  Are comfortable and fit you well.  Are closed at the toe. Do not wear sandals.  If you use a stepladder:  Make sure that it is fully  opened. Do not climb a closed stepladder.  Make sure that both sides of the stepladder are locked into place.  Ask someone to hold it for you, if possible.  Clearly mark and make sure that you can see:  Any grab bars or handrails.  First and last steps.  Where the edge of each step is.  Use tools that help you move around (mobility aids) if they are needed. These include:  Canes.  Walkers.  Scooters.  Crutches.  Turn on the lights when you go into a dark area. Replace any light bulbs as soon as they burn out.  Set up your furniture so you have a clear path. Avoid moving your furniture around.  If any of your floors are uneven, fix them.  If there are any pets around you, be aware of where they are.  Review your medicines with your doctor. Some medicines can make you feel dizzy. This can increase your chance of falling. Ask your doctor what other things that you can do to help prevent falls. This information is not intended to replace advice given to you by your health care provider. Make sure you discuss any questions you have with your health care provider. Document Released: 06/29/2009 Document Revised: 02/08/2016 Document Reviewed: 10/07/2014 Elsevier Interactive Patient Education  2017 [place].  Influenza (Flu) Vaccine (Inactivated or Recombinant): What You Need to Know 1. Why get vaccinated? Influenza ("flu") is a contagious disease that spreads around the [place] every year, usually between October and May. Flu is caused by influenza viruses, and is spread mainly by coughing, sneezing, and close contact. Anyone can get flu. Flu strikes suddenly and can last several days. Symptoms vary by age, but can include:  fever/chills  sore throat  muscle aches  fatigue  cough  headache  runny or stuffy nose  Flu can also lead to pneumonia and blood infections, and cause diarrhea and seizures in children. If you have a medical condition, such as heart  or lung disease, flu can make it worse. Flu is more dangerous for some people. Infants and young children, people 51 years of age and older, pregnant women, and people with certain health conditions or a weakened immune system are at greatest risk. Each year thousands of people in the [place] die from flu, and many more are hospitalized. Flu vaccine can:  keep you from  getting flu,  make flu less severe if you do get it, and  keep you from spreading flu to your family and other people. 2. Inactivated and recombinant flu vaccines A dose of flu vaccine is recommended every flu season. Children 6 months through 6 years of age may need two doses during the same flu season. Everyone else needs only one dose each flu season. Some inactivated flu vaccines contain a very small amount of a mercury-based preservative called thimerosal. Studies have not shown thimerosal in vaccines to be harmful, but flu vaccines that do not contain thimerosal are available. There is no live flu virus in flu shots. They cannot cause the flu. There are many flu viruses, and they are always changing. Each year a new flu vaccine is made to protect against three or four viruses that are likely to cause disease in the upcoming flu season. But even when the vaccine doesn't exactly match these viruses, it may still provide some protection. Flu vaccine cannot prevent:  flu that is caused by a virus not covered by the vaccine, or  illnesses that look like flu but are not.  It takes about 2 weeks for protection to develop after vaccination, and protection lasts through the flu season. 3. Some people should not get this vaccine Tell the person who is giving you the vaccine:  If you have any severe, life-threatening allergies. If you ever had a life-threatening allergic reaction after a dose of flu vaccine, or have a severe allergy to any part of this vaccine, you may be advised not to get vaccinated. Most, but not all,  types of flu vaccine contain a small amount of egg protein.  If you ever had Guillain-Barr Syndrome (also called GBS). Some people with a history of GBS should not get this vaccine. This should be discussed with your doctor.  If you are not feeling well. It is usually okay to get flu vaccine when you have a mild illness, but you might be asked to come back when you feel better.  4. Risks of a vaccine reaction With any medicine, including vaccines, there is a chance of reactions. These are usually mild and go away on their own, but serious reactions are also possible. Most people who get a flu shot do not have any problems with it. Minor problems following a flu shot include:  soreness, redness, or swelling where the shot was given  hoarseness  sore, red or itchy eyes  cough  fever  aches  headache  itching  fatigue  If these problems occur, they usually begin soon after the shot and last 1 or 2 days. More serious problems following a flu shot can include the following:  There may be a small increased risk of Guillain-Barre Syndrome (GBS) after inactivated flu vaccine. This risk has been estimated at 1 or 2 additional cases per million people vaccinated. This is much lower than the risk of severe complications from flu, which can be prevented by flu vaccine.  Young children who get the flu shot along with pneumococcal vaccine (PCV13) and/or DTaP vaccine at the same time might be slightly more likely to have a seizure caused by fever. Ask your doctor for more information. Tell your doctor if a child who is getting flu vaccine has ever had a seizure.  Problems that could happen after any injected vaccine:  People sometimes faint after a medical procedure, including vaccination. Sitting or lying down for about 15 minutes can help prevent fainting, and  injuries caused by a fall. Tell your doctor if you feel dizzy, or have vision changes or ringing in the ears.  Some people get  severe pain in the shoulder and have difficulty moving the arm where a shot was given. This happens very rarely.  Any medication can cause a severe allergic reaction. Such reactions from a vaccine are very rare, estimated at about 1 in a million doses, and would happen within a few minutes to a few hours after the vaccination. As with any medicine, there is a very remote chance of a vaccine causing a serious injury or death. The safety of vaccines is always being monitored. For more information, visit: http://www.aguilar.org/ 5. What if there is a serious reaction? What should I look for? Look for anything that concerns you, such as signs of a severe allergic reaction, very high fever, or unusual behavior. Signs of a severe allergic reaction can include hives, swelling of the face and throat, difficulty breathing, a fast heartbeat, dizziness, and weakness. These would start a few minutes to a few hours after the vaccination. What should I do?  If you think it is a severe allergic reaction or other emergency that can't wait, call 9-1-1 and get the person to the nearest hospital. Otherwise, call your doctor.  Reactions should be reported to the Vaccine Adverse Event Reporting System (VAERS). Your doctor should file this report, or you can do it yourself through the VAERS web site at www.vaers.SamedayNews.es, or by calling (843) 133-1240. ? VAERS does not give medical advice. 6. The National Vaccine Injury Compensation Program The [employer] Vaccine Injury Compensation Program (VICP) is a federal program that was created to compensate people who may have been injured by certain vaccines. Persons who believe they may have been injured by a vaccine can learn about the program and about filing a claim by calling (614)246-6721 or visiting the [place] website at GoldCloset.com.ee. There is a time limit to file a claim for compensation. 7. How can I learn more?  Ask your healthcare provider. He or  she can give you the vaccine package insert or suggest other sources of information.  Call your local or state health department.  Contact the Centers for Disease Control and Prevention (CDC): ? Call 619-232-1502 (1-800-CDC-INFO) or ? Visit CDC's website at https://gibson.com/ Vaccine Information Statement, Inactivated Influenza Vaccine (04/22/2014) This information is not intended to replace advice given to you by your health care provider. Make sure you discuss any questions you have with your health care provider. Document Released: 06/27/2006 Document Revised: 05/23/2016 Document Reviewed: 05/23/2016 Elsevier Interactive Patient Education  2017 [name].

## 2017-07-12 ENCOUNTER — Encounter: Payer: Self-pay | Admitting: Gynecology

## 2017-07-12 ENCOUNTER — Ambulatory Visit: Payer: Medicare Other

## 2017-07-12 ENCOUNTER — Ambulatory Visit
Admission: EM | Admit: 2017-07-12 | Discharge: 2017-07-12 | Disposition: A | Discharge status: Home / Self Care | Payer: Medicare Other | Attending: Family Medicine | Admitting: Family Medicine

## 2017-07-12 DIAGNOSIS — Z87891 Personal history of nicotine dependence: Secondary | ICD-10-CM | POA: Insufficient documentation

## 2017-07-12 DIAGNOSIS — D696 Thrombocytopenia, unspecified: Secondary | ICD-10-CM | POA: Insufficient documentation

## 2017-07-12 DIAGNOSIS — Y9301 Activity, walking, marching and hiking: Secondary | ICD-10-CM | POA: Insufficient documentation

## 2017-07-12 DIAGNOSIS — M25561 Pain in right knee: Secondary | ICD-10-CM | POA: Diagnosis not present

## 2017-07-12 DIAGNOSIS — Z7982 Long term (current) use of aspirin: Secondary | ICD-10-CM | POA: Diagnosis not present

## 2017-07-12 DIAGNOSIS — I129 Hypertensive chronic kidney disease with stage 1 through stage 4 chronic kidney disease, or unspecified chronic kidney disease: Secondary | ICD-10-CM | POA: Diagnosis not present

## 2017-07-12 DIAGNOSIS — S8991XA Unspecified injury of right lower leg, initial encounter: Secondary | ICD-10-CM | POA: Diagnosis not present

## 2017-07-12 DIAGNOSIS — D72819 Decreased white blood cell count, unspecified: Secondary | ICD-10-CM | POA: Diagnosis not present

## 2017-07-12 DIAGNOSIS — R001 Bradycardia, unspecified: Secondary | ICD-10-CM | POA: Diagnosis not present

## 2017-07-12 DIAGNOSIS — N183 Chronic kidney disease, stage 3 (moderate): Secondary | ICD-10-CM | POA: Insufficient documentation

## 2017-07-12 DIAGNOSIS — I4891 Unspecified atrial fibrillation: Secondary | ICD-10-CM | POA: Insufficient documentation

## 2017-07-12 DIAGNOSIS — E785 Hyperlipidemia, unspecified: Secondary | ICD-10-CM | POA: Diagnosis not present

## 2017-07-12 DIAGNOSIS — I25110 Atherosclerotic heart disease of native coronary artery with unstable angina pectoris: Secondary | ICD-10-CM | POA: Insufficient documentation

## 2017-07-12 DIAGNOSIS — I252 Old myocardial infarction: Secondary | ICD-10-CM | POA: Insufficient documentation

## 2017-07-12 DIAGNOSIS — N400 Enlarged prostate without lower urinary tract symptoms: Secondary | ICD-10-CM | POA: Diagnosis not present

## 2017-07-12 DIAGNOSIS — E039 Hypothyroidism, unspecified: Secondary | ICD-10-CM | POA: Diagnosis not present

## 2017-07-12 DIAGNOSIS — Z79899 Other long term (current) drug therapy: Secondary | ICD-10-CM | POA: Diagnosis not present

## 2017-07-12 DIAGNOSIS — Z8501 Personal history of malignant neoplasm of esophagus: Secondary | ICD-10-CM | POA: Diagnosis not present

## 2017-07-12 DIAGNOSIS — K219 Gastro-esophageal reflux disease without esophagitis: Secondary | ICD-10-CM | POA: Insufficient documentation

## 2017-07-12 DIAGNOSIS — Z87442 Personal history of urinary calculi: Secondary | ICD-10-CM | POA: Diagnosis not present

## 2017-07-12 MED ORDER — MELOXICAM 7.5 MG PO TABS: 7.5000 mg | ORAL_TABLET | Freq: Every day | ORAL | 0 refills | Status: DC

## 2017-07-12 NOTE — ED Provider Notes (Signed)
[facility]    CSN: [id number] Arrival date & time: 07/12/17  1536     History   Chief Complaint Chief Complaint  Patient presents with  . Knee Pain    HPI Corey Perry is a 71 y.o. male.   Patient is a 71 year old male who presents complaining of intermittent knee pain on the right. Patient states her week ago he felt a little pop while walking and that occasionally he will feel pain similar to "electric shock" moving through his knee joint while walking. Patient states he is not taking any medicines for this. Patient denies any trauma or previous knee injuries as as well as denies any issues with his left knee. Patient reports his work throughout his life rate involved him being on his feet most of the time. Reports that most of his pain is felt on the medial side of his knee. Also reports occasional grinding all walking.       Past Medical History:  Diagnosis Date  . Abnormal white blood cell (WBC) count    seeing Dr. [name] at [place] on 10/25  . Atrial fibrillation ([place])   . BPH (benign prostatic hypertrophy)   . Bradycardia   . CAD (coronary artery disease)   . Dysrhythmia    bradycardia - sees Dr. Humphrey Rolls  . Esophageal cancer ([place]) 05/2016  . GERD (gastroesophageal reflux disease)   . History of kidney stones   . Hyperlipidemia   . Hypertension   . Hypothyroidism   . Myocardial infarction ([place]) 2005  . Sleep apnea    mild-NO CPAP  . Spinal stenosis    lumbar    Patient Active Problem List   Diagnosis Date Noted  . Nephrolithiasis 02/02/2017  . Port-A-Cath in place   . Port catheter in place   . Esophageal cancer ([place]) 06/23/2016  . Leukopenia 04/10/2016  . Thrombocytopenia ([place]) 04/10/2016  . Unstable angina ([place]) 03/12/2016  . Hypertensive kidney disease with CKD stage III ([place]) 06/22/2015  . CAD (coronary artery disease) 06/21/2015  . BPH (benign prostatic hyperplasia) 06/21/2015  . Acid reflux 06/21/2015  . Hypothyroid   .  Hyperlipidemia   . Hypertension     Past Surgical History:  Procedure Laterality Date  . BACK SURGERY    . CARDIAC CATHETERIZATION Left 03/12/2016   Procedure: Left Heart Cath and Coronary Angiography;  Surgeon: [name], MD;  Location: [place] [facility];  Service: Cardiovascular;  Laterality: Left;  . CARDIAC CATHETERIZATION N/A 03/12/2016   Procedure: Coronary Stent Intervention;  Surgeon: Yolonda Kida, MD;  Location: [place] [facility];  Service: Cardiovascular;  Laterality: N/A;  . COLONOSCOPY WITH PROPOFOL N/A 07/13/2015   Procedure: COLONOSCOPY WITH PROPOFOL;  Surgeon: Lucilla Lame, MD;  Location: [place];  Service: Endoscopy;  Laterality: N/A;  . CORONARY ANGIOPLASTY  10/05 and 12/05   4 DES placed  . ESOPHAGECTOMY  10/07/2016   @ [facility]  . ESOPHAGOGASTRODUODENOSCOPY (EGD) WITH PROPOFOL N/A 06/14/2016   Procedure: ESOPHAGOGASTRODUODENOSCOPY (EGD) WITH PROPOFOL;  Surgeon: [name], MD;  Location: [facility];  Service: Endoscopy;  Laterality: N/A;  . HERNIA REPAIR     x3  . POLYPECTOMY  07/13/2015   Procedure: POLYPECTOMY;  Surgeon: Lucilla Lame, MD;  Location: [place];  Service: Endoscopy;;  . PORT-A-CATH REMOVAL N/A 01/16/2017   Procedure: REMOVAL PORT-A-CATH;  Surgeon: Olean Ree, MD;  Location: [facility];  Service: General;  Laterality: N/A;  . PORTACATH PLACEMENT N/A 07/08/2016  Procedure: INSERTION PORT-A-CATH;  Surgeon: Olean Ree, MD;  Location: [facility];  Service: General;  Laterality: N/A;       Home Medications    Prior to Admission medications   Medication Sig Start Date End Date Taking? Authorizing Provider  acetaminophen (TYLENOL) 500 MG tablet Take 500 mg by mouth every 6 (six) hours as needed for mild pain or moderate pain.   Yes [provider]  aspirin 81 MG tablet Take 81 mg by mouth daily. AM   Yes [provider]  clopidogrel (PLAVIX) 75 MG tablet Take 1 tablet (75 mg total) by mouth  daily. 01/22/17  Yes Crissman, Jeannette How, MD  isosorbide mononitrate (IMDUR) 30 MG 24 hr tablet Take 30 mg by mouth daily. AM 06/12/15  Yes [provider]  levothyroxine (SYNTHROID, LEVOTHROID) 50 MCG tablet Take 1 tablet (50 mcg total) by mouth daily. 02/22/17  Yes Crissman, Jeannette How, MD  lisinopril (PRINIVIL,ZESTRIL) 10 MG tablet Take 1 tablet (10 mg total) by mouth every morning. 01/22/17  Yes Crissman, Jeannette How, MD  multivitamin-iron-minerals-folic acid (CENTRUM) chewable tablet Chew 1 tablet by mouth daily.   Yes [provider]  nitroGLYCERIN (NITROSTAT) 0.4 MG SL tablet Place 1 tablet under the tongue every 5 (five) minutes as needed for chest pain.  03/08/16  Yes [provider]  pantoprazole (PROTONIX) 20 MG tablet Take 1 tablet (20 mg total) by mouth daily. Patient taking differently: Take 20 mg by mouth every morning.  06/24/16  Yes Crissman, Jeannette How, MD  simvastatin (ZOCOR) 40 MG tablet Take 1 tablet (40 mg total) by mouth daily. Patient taking differently: Take 40 mg by mouth every morning.  06/24/16  Yes Crissman, Jeannette How, MD  meloxicam (MOBIC) 7.5 MG tablet Take 1 tablet (7.5 mg total) by mouth daily. 07/12/17   Luvenia Redden, PA-C    Family History Family History  Problem Relation Age of Onset  . Hypertension Mother   . Dementia Mother   . Heart disease Father   . Hypertension Father   . Kidney disease Father   . Hypertension Brother   . Heart disease Brother   . Diabetes Son   . Hypertension Son   . Hyperlipidemia Son   . Hyperlipidemia Daughter   . Hypertension Daughter     Social History Social History  Substance Use Topics  . Smoking status: Former Smoker    Packs/day: 1.00    Years: 40.00    Types: Cigarettes    Quit date: 07/16/2004  . Smokeless tobacco: Former Systems developer    Types: Chew  . Alcohol use No     Allergies   Patient has no known allergies.   Review of Systems Review of Systems  As noted above in history of present illness.  Other systems reviewed and found to be negative   Physical Exam Triage Vital Signs ED Triage Vitals  Enc Vitals Group     BP 07/12/17 1547 (!) 148/74     Pulse Rate 07/12/17 1547 (!) 52     Resp 07/12/17 1547 16     Temp 07/12/17 1547 97.8 F (36.6 C)     Temp Source 07/12/17 1547 Oral     SpO2 07/12/17 1547 99 %     Weight 07/12/17 1549 162 lb (73.5 kg)     Height --      Head Circumference --      Peak Flow --      Pain Score 07/12/17 1549 4  Pain Loc --      Pain Edu? --      Excl. in [place]? --    No data found.   Updated Vital Signs BP (!) 148/74 (BP Location: Left Arm)   Pulse (!) 52   Temp 97.8 F (36.6 C) (Oral)   Resp 16   Wt 162 lb (73.5 kg)   SpO2 99%   BMI 23.92 kg/m   Physical Exam  Constitutional: He appears well-developed and well-nourished. No distress.  HENT:  Head: Normocephalic and atraumatic.  Eyes: Pupils are equal, round, and reactive to light. EOM are normal.  Pulmonary/Chest: No respiratory distress.  Musculoskeletal:       Right knee: He exhibits normal range of motion, no swelling, no effusion, no deformity and normal alignment. No medial joint line, no lateral joint line and no patellar tendon tenderness noted.     UC Treatments / Results  Labs (all labs ordered are listed, but only abnormal results are displayed) Labs Reviewed - No data to display  EKG  EKG Interpretation None       Radiology No results found.  Procedures Procedures (including critical care time)  Medications Ordered in UC Medications - No data to display   Initial Impression / Assessment and Plan / UC Course  I have reviewed the triage vital signs and the nursing notes.  Pertinent labs & imaging results that were available during my care of the patient were reviewed by me and considered in my medical decision making (see chart for details).    Patient with intermittent knee pain while walking started about a week and half ago. Knee exam today  negative the patient reports he's got no pain today.   Final Clinical Impressions(s) / UC Diagnoses   Final diagnoses:  Right knee pain, unspecified chronicity   X-ray without any obvious deformities or fractures. Mild arthritis to the medial tibial plateau. Will have patient follow with orthopedics if no improvement. New Prescriptions New Prescriptions   MELOXICAM (MOBIC) 7.5 MG TABLET    Take 1 tablet (7.5 mg total) by mouth daily.     Controlled Substance Prescriptions [place] Controlled Substance Registry consulted? Not Applicable   Luvenia Redden, PA-C 07/12/17 1644

## 2017-07-12 NOTE — ED Triage Notes (Signed)
Patient c/o right knee pain x 1 week. Per patient no injury to his right knee.

## 2017-07-12 NOTE — Discharge Instructions (Signed)
-  Meloxicam: one tablet daily for pain -can use ice as needed for knee pain -some arthritis on xray -follow up with orthopedics if no improvement

## 2017-07-21 ENCOUNTER — Other Ambulatory Visit: Payer: Self-pay

## 2017-07-21 DIAGNOSIS — E7800 Pure hypercholesterolemia, unspecified: Secondary | ICD-10-CM

## 2017-07-21 MED ORDER — SIMVASTATIN 40 MG PO TABS: 40.0000 mg | ORAL_TABLET | Freq: Every day | ORAL | 0 refills | Status: DC

## 2017-07-21 NOTE — Telephone Encounter (Signed)
Last OV: 01/22/17 Next OV: 07/29/17  Lab Results  Component Value Date   CHOL 121 12/25/2015   HDL 30 (L) 06/21/2015   LDLCALC 61 06/21/2015   TRIG 313 (H) 12/25/2015   CHOLHDL 4.3 06/21/2015   Lab Results  Component Value Date   CREATININE 1.30 (H) 03/11/2017   BUN 17 03/11/2017   NA 138 03/11/2017   K 4.2 03/11/2017   CL 105 03/11/2017   CO2 26 03/11/2017

## 2017-07-29 ENCOUNTER — Ambulatory Visit (INDEPENDENT_AMBULATORY_CARE_PROVIDER_SITE_OTHER): Payer: Medicare Other | Admitting: Family Medicine

## 2017-07-29 ENCOUNTER — Encounter: Payer: Self-pay | Admitting: Family Medicine

## 2017-07-29 VITALS — BP 139/77 | HR 51 | Wt 164.0 lb

## 2017-07-29 DIAGNOSIS — E039 Hypothyroidism, unspecified: Secondary | ICD-10-CM

## 2017-07-29 DIAGNOSIS — C155 Malignant neoplasm of lower third of esophagus: Secondary | ICD-10-CM

## 2017-07-29 DIAGNOSIS — N400 Enlarged prostate without lower urinary tract symptoms: Secondary | ICD-10-CM

## 2017-07-29 DIAGNOSIS — I2583 Coronary atherosclerosis due to lipid rich plaque: Secondary | ICD-10-CM | POA: Diagnosis not present

## 2017-07-29 DIAGNOSIS — K4091 Unilateral inguinal hernia, without obstruction or gangrene, recurrent: Secondary | ICD-10-CM | POA: Insufficient documentation

## 2017-07-29 DIAGNOSIS — Z125 Encounter for screening for malignant neoplasm of prostate: Secondary | ICD-10-CM

## 2017-07-29 DIAGNOSIS — K219 Gastro-esophageal reflux disease without esophagitis: Secondary | ICD-10-CM | POA: Diagnosis not present

## 2017-07-29 DIAGNOSIS — K4090 Unilateral inguinal hernia, without obstruction or gangrene, not specified as recurrent: Secondary | ICD-10-CM | POA: Diagnosis not present

## 2017-07-29 DIAGNOSIS — I129 Hypertensive chronic kidney disease with stage 1 through stage 4 chronic kidney disease, or unspecified chronic kidney disease: Secondary | ICD-10-CM

## 2017-07-29 DIAGNOSIS — E7800 Pure hypercholesterolemia, unspecified: Secondary | ICD-10-CM | POA: Diagnosis not present

## 2017-07-29 DIAGNOSIS — D696 Thrombocytopenia, unspecified: Secondary | ICD-10-CM | POA: Diagnosis not present

## 2017-07-29 DIAGNOSIS — Z0000 Encounter for general adult medical examination without abnormal findings: Secondary | ICD-10-CM | POA: Diagnosis not present

## 2017-07-29 DIAGNOSIS — I10 Essential (primary) hypertension: Secondary | ICD-10-CM | POA: Diagnosis not present

## 2017-07-29 DIAGNOSIS — N183 Chronic kidney disease, stage 3 (moderate): Secondary | ICD-10-CM | POA: Diagnosis not present

## 2017-07-29 DIAGNOSIS — I2510 Atherosclerotic heart disease of native coronary artery without angina pectoris: Secondary | ICD-10-CM

## 2017-07-29 DIAGNOSIS — Z7189 Other specified counseling: Secondary | ICD-10-CM | POA: Insufficient documentation

## 2017-07-29 MED ORDER — PANTOPRAZOLE SODIUM 20 MG PO TBEC: 20.0000 mg | DELAYED_RELEASE_TABLET | Freq: Every day | ORAL | 4 refills | Status: DC

## 2017-07-29 MED ORDER — SIMVASTATIN 40 MG PO TABS: 40.0000 mg | ORAL_TABLET | Freq: Every day | ORAL | 4 refills | Status: DC

## 2017-07-29 MED ORDER — LEVOTHYROXINE SODIUM 50 MCG PO TABS: 50.0000 ug | ORAL_TABLET | Freq: Every day | ORAL | 4 refills | Status: DC

## 2017-07-29 MED ORDER — CLOPIDOGREL BISULFATE 75 MG PO TABS: 75.0000 mg | ORAL_TABLET | Freq: Every day | ORAL | 4 refills | Status: DC

## 2017-07-29 MED ORDER — LISINOPRIL 10 MG PO TABS: 10.0000 mg | ORAL_TABLET | ORAL | 4 refills | Status: DC

## 2017-07-29 NOTE — Assessment & Plan Note (Signed)
The current medical regimen is effective;  continue present plan and medications.  

## 2017-07-29 NOTE — Assessment & Plan Note (Signed)
Followed by hematology and reported as stable.

## 2017-07-29 NOTE — Assessment & Plan Note (Signed)
he is followed by [facility] and doing well. Has completed radiation

## 2017-07-29 NOTE — Assessment & Plan Note (Signed)
A voluntary discussion about advance care planning including the explanation and discussion of advance directives was extensively discussed  with the patient.  Explanation about the health care proxy and Living will was reviewed and packet with forms with explanation of how to fill them out was given.    

## 2017-07-29 NOTE — Assessment & Plan Note (Signed)
Refer to surgery

## 2017-07-29 NOTE — Assessment & Plan Note (Signed)
Stable. no meds. 

## 2017-07-29 NOTE — Progress Notes (Signed)
BP 139/77   Pulse (!) 51   Wt 164 lb (74.4 kg)   SpO2 99%   BMI 24.22 kg/m    Subjective:    Patient ID: Corey Perry, male    DOB: 1946-09-10, 71 y.o.   MRN: [id number]  HPI: Corey Perry is a 71 y.o. male  Annual exam  Patient's biggest concern today is recurrence of right inguinal hernia. This is somewhat uncomfortable and wants to have it repaired. Patient's had 3 previous hernia surgeries in the 67s.Has otherwise done well. Blood pressure doing well without complaints. Patient's thyroid medicines doing well no complaints taking faithfully no heart symptoms or complaints taking medications without problems. Reflux does okay mostly unless some dietary indiscretion. Simvastatin also does well for cholesterol with no complaints or side effects. Taking meloxicam for knee arthritis which is helping.  Relevant past medical, surgical, family and social history reviewed and updated as indicated. Interim medical history since our last visit reviewed. Allergies and medications reviewed and updated.  Review of Systems  Constitutional: Negative.   HENT: Negative.   Eyes: Negative.   Respiratory: Negative.   Cardiovascular: Negative.   Gastrointestinal: Negative.   Endocrine: Negative.   Genitourinary: Negative.   Musculoskeletal: Negative.   Skin: Negative.   Allergic/Immunologic: Negative.   Neurological: Negative.   Hematological: Negative.   Psychiatric/Behavioral: Negative.     Per HPI unless specifically indicated above     Objective:    BP 139/77   Pulse (!) 51   Wt 164 lb (74.4 kg)   SpO2 99%   BMI 24.22 kg/m   Wt Readings from Last 3 Encounters:  07/29/17 164 lb (74.4 kg)  07/12/17 162 lb (73.5 kg)  07/02/17 162 lb 9.6 oz (73.8 kg)    Physical Exam  Constitutional: He is oriented to person, place, and time. He appears well-developed and well-nourished.  HENT:  Head: Normocephalic and atraumatic.  Right Ear: External ear normal.  Left Ear:  External ear normal.  Eyes: Conjunctivae and EOM are normal. Pupils are equal, round, and reactive to light.  Neck: Normal range of motion. Neck supple.  Cardiovascular: Normal rate, regular rhythm, normal heart sounds and intact distal pulses.  Pulmonary/Chest: Effort normal and breath sounds normal.  Abdominal: Soft. Bowel sounds are normal. There is no splenomegaly or hepatomegaly.  Genitourinary: Rectum normal and penis normal.  Genitourinary Comments: Right inguinal hernia and BPH changes  Musculoskeletal: Normal range of motion.  Neurological: He is alert and oriented to person, place, and time. He has normal reflexes.  Skin: No rash noted. No erythema.  Psychiatric: He has a normal mood and affect. His behavior is normal. Judgment and thought content normal.    Results for orders placed or performed in visit on 03/11/17  CBC with Differential  Result Value Ref Range   WBC 3.8 3.8 - 10.6 K/uL   RBC 3.75 (L) 4.40 - 5.90 MIL/uL   Hemoglobin 11.6 (L) 13.0 - 18.0 g/dL   HCT 34.2 (L) 40.0 - 52.0 %   MCV 91.1 80.0 - 100.0 fL   MCH 30.8 26.0 - 34.0 pg   MCHC 33.8 32.0 - 36.0 g/dL   RDW 17.4 (H) 11.5 - 14.5 %   Platelets 140 (L) 150 - 440 K/uL   Neutrophils Relative % 66 %   Neutro Abs 2.5 1.4 - 6.5 K/uL   Lymphocytes Relative 16 %   Lymphs Abs 0.6 (L) 1.0 - 3.6 K/uL   Monocytes Relative 11 %  Monocytes Absolute 0.4 0.2 - 1.0 K/uL   Eosinophils Relative 6 %   Eosinophils Absolute 0.2 0 - 0.7 K/uL   Basophils Relative 1 %   Basophils Absolute 0.0 0 - 0.1 K/uL  Comprehensive metabolic panel  Result Value Ref Range   Sodium 138 135 - 145 mmol/L   Potassium 4.2 3.5 - 5.1 mmol/L   Chloride 105 101 - 111 mmol/L   CO2 26 22 - 32 mmol/L   Glucose, Bld 105 (H) 65 - 99 mg/dL   BUN 17 6 - 20 mg/dL   Creatinine, Ser 1.30 (H) 0.61 - 1.24 mg/dL   Calcium 9.4 8.9 - 10.3 mg/dL   Total Protein 7.6 6.5 - 8.1 g/dL   Albumin 3.9 3.5 - 5.0 g/dL   AST 23 15 - 41 U/L   ALT 16 (L) 17 - 63 U/L    Alkaline Phosphatase 61 38 - 126 U/L   Total Bilirubin 0.8 0.3 - 1.2 mg/dL   GFR calc non Af Amer 54 (L) >60 mL/min   GFR calc Af Amer >60 >60 mL/min   Anion gap 7 5 - 15      Assessment & Plan:   Problem List Items Addressed This Visit      Cardiovascular and Mediastinum   Hypertension    The current medical regimen is effective;  continue present plan and medications.       Relevant Medications   lisinopril (PRINIVIL,ZESTRIL) 10 MG tablet   simvastatin (ZOCOR) 40 MG tablet   Other Relevant Orders   CBC with Differential/Platelet   Comprehensive metabolic panel   Urinalysis, Routine w reflex microscopic   CAD (coronary artery disease)    The current medical regimen is effective;  continue present plan and medications.       Relevant Medications   lisinopril (PRINIVIL,ZESTRIL) 10 MG tablet   simvastatin (ZOCOR) 40 MG tablet     Digestive   Acid reflux    The current medical regimen is effective;  continue present plan and medications.       Relevant Medications   pantoprazole (PROTONIX) 20 MG tablet   Esophageal cancer ([place])    he is followed by [facility] and doing well. Has completed radiation      Relevant Medications   clopidogrel (PLAVIX) 75 MG tablet     Endocrine   Hypothyroid    The current medical regimen is effective;  continue present plan and medications.       Relevant Medications   levothyroxine (SYNTHROID, LEVOTHROID) 50 MCG tablet   Other Relevant Orders   TSH     Genitourinary   BPH (benign prostatic hyperplasia)    Stable no meds      Relevant Orders   PSA   Hypertensive kidney disease with CKD stage III ([place])    The current medical regimen is effective;  continue present plan and medications.         Other   Hyperlipidemia    The current medical regimen is effective;  continue present plan and medications.       Relevant Medications   lisinopril (PRINIVIL,ZESTRIL) 10 MG tablet   simvastatin (ZOCOR) 40 MG tablet     Other Relevant Orders   CBC with Differential/Platelet   Comprehensive metabolic panel   Lipid panel   Urinalysis, Routine w reflex microscopic   Thrombocytopenia ([place])    Followed by hematology and reported as stable.      Advanced care planning/counseling discussion    A  voluntary discussion about advance care planning including the explanation and discussion of advance directives was extensively discussed  with the patient.  Explanation about the health care proxy and Living will was reviewed and packet with forms with explanation of how to fill them out was given.        Inguinal hernia    Refer to surgery      Relevant Orders   Ambulatory referral to General Surgery    Other Visit Diagnoses    Prostate cancer screening    -  Primary   Relevant Orders   PSA   Urinalysis, Routine w reflex microscopic   PE (physical exam), annual           Follow up plan: Return in 6 months (on 01/26/2018) for BMP,  Lipids, ALT, AST.

## 2017-07-30 ENCOUNTER — Encounter: Payer: Self-pay | Admitting: General Surgery

## 2017-08-04 DIAGNOSIS — I2510 Atherosclerotic heart disease of native coronary artery without angina pectoris: Secondary | ICD-10-CM | POA: Diagnosis not present

## 2017-08-04 DIAGNOSIS — I10 Essential (primary) hypertension: Secondary | ICD-10-CM | POA: Diagnosis not present

## 2017-08-04 DIAGNOSIS — E785 Hyperlipidemia, unspecified: Secondary | ICD-10-CM | POA: Diagnosis not present

## 2017-08-04 DIAGNOSIS — R0789 Other chest pain: Secondary | ICD-10-CM | POA: Diagnosis not present

## 2017-08-05 DIAGNOSIS — Z87891 Personal history of nicotine dependence: Secondary | ICD-10-CM | POA: Diagnosis not present

## 2017-08-05 DIAGNOSIS — Z08 Encounter for follow-up examination after completed treatment for malignant neoplasm: Secondary | ICD-10-CM | POA: Diagnosis not present

## 2017-08-05 DIAGNOSIS — Z923 Personal history of irradiation: Secondary | ICD-10-CM | POA: Diagnosis not present

## 2017-08-05 DIAGNOSIS — C159 Malignant neoplasm of esophagus, unspecified: Secondary | ICD-10-CM | POA: Diagnosis not present

## 2017-08-05 DIAGNOSIS — Z9221 Personal history of antineoplastic chemotherapy: Secondary | ICD-10-CM | POA: Diagnosis not present

## 2017-08-05 DIAGNOSIS — I2510 Atherosclerotic heart disease of native coronary artery without angina pectoris: Secondary | ICD-10-CM | POA: Diagnosis not present

## 2017-08-05 DIAGNOSIS — K8689 Other specified diseases of pancreas: Secondary | ICD-10-CM | POA: Diagnosis not present

## 2017-08-05 DIAGNOSIS — N2889 Other specified disorders of kidney and ureter: Secondary | ICD-10-CM | POA: Diagnosis not present

## 2017-08-05 DIAGNOSIS — Z9049 Acquired absence of other specified parts of digestive tract: Secondary | ICD-10-CM | POA: Diagnosis not present

## 2017-08-05 DIAGNOSIS — R918 Other nonspecific abnormal finding of lung field: Secondary | ICD-10-CM | POA: Diagnosis not present

## 2017-08-12 DIAGNOSIS — R079 Chest pain, unspecified: Secondary | ICD-10-CM | POA: Diagnosis not present

## 2017-08-13 ENCOUNTER — Encounter: Payer: Self-pay | Admitting: General Surgery

## 2017-08-13 ENCOUNTER — Ambulatory Visit (INDEPENDENT_AMBULATORY_CARE_PROVIDER_SITE_OTHER): Payer: Medicare Other | Admitting: General Surgery

## 2017-08-13 VITALS — BP 120/66 | HR 68 | Resp 12 | Ht 69.0 in | Wt 159.0 lb

## 2017-08-13 DIAGNOSIS — K4091 Unilateral inguinal hernia, without obstruction or gangrene, recurrent: Secondary | ICD-10-CM

## 2017-08-13 DIAGNOSIS — I2583 Coronary atherosclerosis due to lipid rich plaque: Secondary | ICD-10-CM

## 2017-08-13 DIAGNOSIS — I2510 Atherosclerotic heart disease of native coronary artery without angina pectoris: Secondary | ICD-10-CM | POA: Diagnosis not present

## 2017-08-13 NOTE — Progress Notes (Signed)
Patient ID: Corey Perry, male   DOB: 05-08-1946, 71 y.o.   MRN: [id number]  Chief Complaint  Patient presents with  . Hernia    HPI Corey Perry is a 71 y.o. male here today for a evaluation of right inguinal hernia . Patient noticed this area about six months ago. No change in size or no pain. Wife, Meryl Crutch is present at visit.  The patient had a cardiac event in July 2017 requiring stent placement. Shortly after this he was diagnosed with esophageal cancer.He underwent preoperative chemoradiation locally and subsequently underwent esophagectomy in January 2018 at [facility]. During his treatment he lost a significant amount of weight in spite of having a feeding tube placed. Weight is stable at present.  The patient previously had had bilateral inguinal hernias completed by Patsey Berthold, M.D. This would've been before 1986.  HPI  Past Medical History:  Diagnosis Date  . Abnormal white blood cell (WBC) count    seeing Dr. [name] at [facility] on 10/25  . Atrial fibrillation ([place])   . BPH (benign prostatic hypertrophy)   . Bradycardia   . CAD (coronary artery disease)   . Dysrhythmia    bradycardia - sees Dr. Humphrey Rolls  . Esophageal cancer ([place]) 05/2016  . GERD (gastroesophageal reflux disease)   . History of kidney stones   . Hyperlipidemia   . Hypertension   . Hypothyroidism   . Myocardial infarction ([place]) 2005  . Sleep apnea    mild-NO CPAP  . Spinal stenosis    lumbar    Past Surgical History:  Procedure Laterality Date  . BACK SURGERY    . CARDIAC CATHETERIZATION Left 03/12/2016   Procedure: Left Heart Cath and Coronary Angiography;  Surgeon: [name], MD;  Location: [place] [facility];  Service: Cardiovascular;  Laterality: Left;  . CARDIAC CATHETERIZATION N/A 03/12/2016   Procedure: Coronary Stent Intervention;  Surgeon: Yolonda Kida, MD;  Location: [place] [facility];  Service: Cardiovascular;  Laterality: N/A;  . COLONOSCOPY WITH PROPOFOL  N/A 07/13/2015   Procedure: COLONOSCOPY WITH PROPOFOL;  Surgeon: Lucilla Lame, MD;  Location: [place];  Service: Endoscopy;  Laterality: N/A;  . CORONARY ANGIOPLASTY  10/05 and 12/05   4 DES placed  . ESOPHAGECTOMY  10/07/2016   @ [facility]  . ESOPHAGOGASTRODUODENOSCOPY (EGD) WITH PROPOFOL N/A 06/14/2016   Procedure: ESOPHAGOGASTRODUODENOSCOPY (EGD) WITH PROPOFOL;  Surgeon: [name], MD;  Location[facility];  Service: Endoscopy;  Laterality: N/A;  . HERNIA REPAIR Bilateral    x3  . POLYPECTOMY  07/13/2015   Procedure: POLYPECTOMY;  Surgeon: Lucilla Lame, MD;  Location: [facility];  Service: Endoscopy;;  . PORT-A-CATH REMOVAL N/A 01/16/2017   Procedure: REMOVAL PORT-A-CATH;  Surgeon: Olean Ree, MD;  Location: [facility];  Service: General;  Laterality: N/A;  . PORTACATH PLACEMENT N/A 07/08/2016   Procedure: INSERTION PORT-A-CATH;  Surgeon: Olean Ree, MD;  Location: [facility];  Service: General;  Laterality: N/A;    Family History  Problem Relation Age of Onset  . Hypertension Mother   . Dementia Mother   . Heart disease Father   . Hypertension Father   . Kidney disease Father   . Hypertension Brother   . Heart disease Brother   . Diabetes Son   . Hypertension Son   . Hyperlipidemia Son   . Hyperlipidemia Daughter   . Hypertension Daughter     Social History Social History   Tobacco Use  . Smoking status: Former  Smoker    Packs/day: 1.00    Years: 40.00    Pack years: 40.00    Types: Cigarettes    Last attempt to quit: 07/16/2004    Years since quitting: 13.0  . Smokeless tobacco: Former Systems developer    Types: Chew  Substance Use Topics  . Alcohol use: No    Alcohol/week: 0.0 oz  . Drug use: No    No Known Allergies  Current Outpatient Medications  Medication Sig Dispense Refill  . acetaminophen (TYLENOL) 500 MG tablet Take 500 mg by mouth every 6 (six) hours as needed for mild pain or moderate pain.    Marland Kitchen aspirin 81 MG tablet Take 81 mg by  mouth daily. AM    . clopidogrel (PLAVIX) 75 MG tablet Take 1 tablet (75 mg total) daily by mouth. 90 tablet 4  . isosorbide mononitrate (IMDUR) 30 MG 24 hr tablet Take 30 mg by mouth daily. AM  5  . levothyroxine (SYNTHROID, LEVOTHROID) 50 MCG tablet Take 1 tablet (50 mcg total) daily by mouth. 90 tablet 4  . lisinopril (PRINIVIL,ZESTRIL) 10 MG tablet Take 1 tablet (10 mg total) every morning by mouth. 90 tablet 4  . multivitamin-iron-minerals-folic acid (CENTRUM) chewable tablet Chew 1 tablet by mouth daily.    . nitroGLYCERIN (NITROSTAT) 0.4 MG SL tablet Place 1 tablet under the tongue every 5 (five) minutes as needed for chest pain.   98  . pantoprazole (PROTONIX) 20 MG tablet Take 1 tablet (20 mg total) daily by mouth. 90 tablet 4  . simvastatin (ZOCOR) 40 MG tablet Take 1 tablet (40 mg total) daily by mouth. 90 tablet 4   No current facility-administered medications for this visit.     Review of Systems Review of Systems  Constitutional: Negative.   Respiratory: Negative.   Cardiovascular: Negative.     Blood pressure 120/66, pulse 68, resp. rate 12, height 5\' 9"  (1.753 m), weight 159 lb (72.1 kg).  Physical Exam Physical Exam  Constitutional: He appears well-developed and well-nourished.  Eyes: Conjunctivae are normal. No scleral icterus.  Neck: Neck supple.  Cardiovascular: Normal rate, regular rhythm and normal heart sounds.  Pulmonary/Chest: Effort normal and breath sounds normal.  Abdominal: Soft. Normal appearance and bowel sounds are normal. There is no tenderness. A hernia is present. Hernia confirmed positive in the right inguinal area.  Genitourinary:     Lymphadenopathy:    He has no cervical adenopathy.  Skin: Skin is warm and dry.    Data Reviewed 12/21/2016(3 months postop) C showed a hemoglobin of 11.6 with an MCV of 98 Blood cell count of 4800. Platelet count of 153,000. [facility] metabolic panel the same date showed a creatinine of 1.36 with an  estimated GFR 51. Normal electrolytes. Operative note for the 10/07/2016 esophagogastrectomy reviewed. Pathology showed 3.2 cm residual tumor, pT3,N0.   Assessment    Recurrent right inguinal hernia.    Plan    The patient had underwent placement of a drug-eluting stent for a 95%mid LAD lesion. 0 stenosis after placement. Presently managed on aspirin and Plavix.     Elective repair of his hernia is appropriate.e is scheduled to meet with his cardiologist on 08/15/2017. We'll confirm that the patient may discontinue his Plavix one week priorto surgery. Aspirin may be continued.  A fax was sent to his cardiologist this afternoon informing him of plans for surgery.   Hernia precautions and incarceration were discussed with the patient. If they develop symptoms of an incarcerated hernia, they were  encouraged to seek prompt medical attention.  I have recommended repair of the hernia using mesh on an outpatient basis in the near future. The risk of infection was reviewed. The role of prosthetic mesh to minimize the risk of recurrence was reviewed.  Patient to stop is Plavix one week for surgery.  HPI, Physical Exam, Assessment and Plan have been scribed under the direction and in the presence of Hervey Ard, MD.  Gaspar Cola, CMA  I have completed the exam and reviewed the above documentation for accuracy and completeness.  I agree with the above.  Haematologist has been used and any errors in dictation or transcription are unintentional.  Hervey Ard, M.D., F.A.C.S.  Robert Bellow 08/13/2017, 9:32 PM  Patient's surgery has been scheduled for 08-26-17 at [facility]. Patient has been asked to stop Plavix seven days prior to procedure. It is okay for patient to continue an 81 mg aspirin once daily.   Dominga Ferry, CMA

## 2017-08-13 NOTE — Patient Instructions (Addendum)
Patient to stop is Plavix one week for surgery. Stay on aspirin    Inguinal Hernia, Adult An inguinal hernia is when fat or the intestines push through the area where the leg meets the lower abdomen (groin) and create a rounded lump (bulge). This condition develops over time. There are three types of inguinal hernias. These types include:  Hernias that can be pushed back into the belly (are reducible).  Hernias that are not reducible (are incarcerated).  Hernias that are not reducible and lose their blood supply (are strangulated). This type of hernia requires emergency surgery.  What are the causes? This condition is caused by having a weak spot in the muscles or tissue. This weakness lets the hernia poke through. This condition can be triggered by:  Suddenly straining the muscles of the lower abdomen.  Lifting heavy objects.  Straining to have a bowel movement. Difficult bowel movements (constipation) can lead to this.  Coughing.  What increases the risk? This condition is more likely to develop in:  Men.  Pregnant women.  People who: ? Are overweight. ? Work in jobs that require long periods of standing or heavy lifting. ? Have had an inguinal hernia before. ? Smoke or have lung disease. These factors can lead to long-lasting (chronic) coughing.  What are the signs or symptoms? Symptoms can depend on the size of the hernia. Often, a small inguinal hernia has no symptoms. Symptoms of a larger hernia include:  A lump in the groin. This is easier to see when the person is standing. It might not be visible when he or she is lying down.  Pain or burning in the groin. This occurs especially when lifting, straining, or coughing.  A dull ache or a feeling of pressure in the groin.  A lump in the scrotum in men.  Symptoms of a strangulated inguinal hernia can include:  A bulge in the groin that is very painful and tender to the touch.  A bulge that turns red or  purple.  Fever, nausea, and vomiting.  The inability to have a bowel movement or to pass gas.  How is this diagnosed? This condition is diagnosed with a medical history and physical exam. Your health care provider may feel your groin area and ask you to cough. How is this treated? Treatment for this condition varies depending on the size of your hernia and whether you have symptoms. If you do not have symptoms, your health care provider may have you watch your hernia carefully and come in for follow-up visits. If your hernia is larger or if you have symptoms, your treatment will include surgery. Follow these instructions at home: Lifestyle  Drink enough fluid to keep your urine clear or pale yellow.  Eat a diet that includes a lot of fiber. Eat plenty of fruits, vegetables, and whole grains. Talk with your health care provider if you have questions.  Avoid lifting heavy objects.  Avoid standing for long periods of time.  Do not use tobacco products, including cigarettes, chewing tobacco, or e-cigarettes. If you need help quitting, ask your health care provider.  Maintain a healthy weight. General instructions  Do not try to force the hernia back in.  Watch your hernia for any changes in color or size. Let your health care provider know if any changes occur.  Take over-the-counter and prescription medicines only as told by your health care provider.  Keep all follow-up visits as told by your health care provider. This is important.  Contact a health care provider if:  You have a fever.  You have new symptoms.  Your symptoms get worse. Get help right away if:  You have pain in the groin that suddenly gets worse.  A bulge in the groin gets bigger suddenly and does not go down.  You are a man and you have a sudden pain in the scrotum, or the size of your scrotum suddenly changes.  A bulge in the groin area becomes red or purple and is painful to the touch.  You have  nausea or vomiting that does not go away.  You feel your heart beating a lot more quickly than normal.  You cannot have a bowel movement or pass gas. This information is not intended to replace advice given to you by your health care provider. Make sure you discuss any questions you have with your health care provider. Document Released: 01/19/2009 Document Revised: 02/08/2016 Document Reviewed: 07/13/2014 Elsevier Interactive Patient Education  2018 [name].

## 2017-08-15 DIAGNOSIS — Z9861 Coronary angioplasty status: Secondary | ICD-10-CM | POA: Diagnosis not present

## 2017-08-15 DIAGNOSIS — E785 Hyperlipidemia, unspecified: Secondary | ICD-10-CM | POA: Diagnosis not present

## 2017-08-15 DIAGNOSIS — I2510 Atherosclerotic heart disease of native coronary artery without angina pectoris: Secondary | ICD-10-CM | POA: Diagnosis not present

## 2017-08-15 DIAGNOSIS — R9430 Abnormal result of cardiovascular function study, unspecified: Secondary | ICD-10-CM | POA: Diagnosis not present

## 2017-08-15 DIAGNOSIS — I10 Essential (primary) hypertension: Secondary | ICD-10-CM | POA: Diagnosis not present

## 2017-08-15 DIAGNOSIS — R0602 Shortness of breath: Secondary | ICD-10-CM | POA: Diagnosis not present

## 2017-08-18 ENCOUNTER — Encounter
Admission: RE | Admit: 2017-08-18 | Discharge: 2017-08-18 | Disposition: A | Discharge status: Home / Self Care | Payer: Medicare Other | Source: Ambulatory Visit | Attending: General Surgery | Admitting: General Surgery

## 2017-08-18 ENCOUNTER — Other Ambulatory Visit: Payer: Self-pay

## 2017-08-18 DIAGNOSIS — I10 Essential (primary) hypertension: Secondary | ICD-10-CM | POA: Diagnosis not present

## 2017-08-18 DIAGNOSIS — E785 Hyperlipidemia, unspecified: Secondary | ICD-10-CM | POA: Diagnosis not present

## 2017-08-18 DIAGNOSIS — R0602 Shortness of breath: Secondary | ICD-10-CM | POA: Diagnosis not present

## 2017-08-18 DIAGNOSIS — I340 Nonrheumatic mitral (valve) insufficiency: Secondary | ICD-10-CM | POA: Diagnosis not present

## 2017-08-18 DIAGNOSIS — I2510 Atherosclerotic heart disease of native coronary artery without angina pectoris: Secondary | ICD-10-CM | POA: Diagnosis not present

## 2017-08-18 DIAGNOSIS — C3490 Malignant neoplasm of unspecified part of unspecified bronchus or lung: Secondary | ICD-10-CM | POA: Diagnosis not present

## 2017-08-18 DIAGNOSIS — I351 Nonrheumatic aortic (valve) insufficiency: Secondary | ICD-10-CM | POA: Diagnosis not present

## 2017-08-18 DIAGNOSIS — Z01812 Encounter for preprocedural laboratory examination: Secondary | ICD-10-CM | POA: Diagnosis not present

## 2017-08-18 HISTORY — DX: Peripheral vascular angioplasty status with implants and grafts: Z95.820

## 2017-08-18 LAB — CBC WITH DIFFERENTIAL/PLATELET
BASOS PCT: 1 %
Basophils Absolute: 0 10*3/uL (ref 0–0.1)
EOS ABS: 0.1 10*3/uL (ref 0–0.7)
EOS PCT: 3 %
HCT: 37.3 % — ABNORMAL LOW (ref 40.0–52.0)
HEMOGLOBIN: 12.4 g/dL — AB (ref 13.0–18.0)
Lymphocytes Relative: 19 %
Lymphs Abs: 0.7 10*3/uL — ABNORMAL LOW (ref 1.0–3.6)
MCH: 31.8 pg (ref 26.0–34.0)
MCHC: 33.1 g/dL (ref 32.0–36.0)
MCV: 96 fL (ref 80.0–100.0)
Monocytes Absolute: 0.4 10*3/uL (ref 0.2–1.0)
Monocytes Relative: 10 %
NEUTROS PCT: 67 %
Neutro Abs: 2.6 10*3/uL (ref 1.4–6.5)
PLATELETS: 158 10*3/uL (ref 150–440)
RBC: 3.89 MIL/uL — AB (ref 4.40–5.90)
RDW: 14.9 % — ABNORMAL HIGH (ref 11.5–14.5)
WBC: 3.8 10*3/uL (ref 3.8–10.6)

## 2017-08-18 LAB — BASIC METABOLIC PANEL
Anion gap: 8 (ref 5–15)
BUN: 15 mg/dL (ref 6–20)
CALCIUM: 8.9 mg/dL (ref 8.9–10.3)
CO2: 26 mmol/L (ref 22–32)
CREATININE: 1.04 mg/dL (ref 0.61–1.24)
Chloride: 106 mmol/L (ref 101–111)
GFR calc non Af Amer: >60 mL/min (ref >60)
Glucose, Bld: 116 mg/dL — ABNORMAL HIGH (ref 65–99)
Potassium: 3.9 mmol/L (ref 3.5–5.1)
SODIUM: 140 mmol/L (ref 135–145)

## 2017-08-18 NOTE — Patient Instructions (Signed)
Your procedure is scheduled on: Tuesday, August 26, 2017 Report to Same Day Surgery on the 2nd floor in the [facility]. To find out your arrival time, please call [phone] between 1PM - 3PM on: Monday, December 10  REMEMBER: Instructions that are not followed completely may result in serious medical risk, up to and including death; or upon the discretion of your surgeon and anesthesiologist your surgery may need to be rescheduled.  Do not eat food after midnight the night before your procedure.  No gum chewing or hard candies.  You may however, drink CLEAR liquids up to 2 hours before you are scheduled to arrive at the hospital for your procedure.  Do not drink clear liquids within 2 hours of the start of your surgery.  Clear liquids include: - water  - apple juice without pulp - clear gatorade - black coffee or tea (Do NOT add anything to the coffee or tea) Do NOT drink anything that is not on this list.  No Alcohol for 24 hours before or after surgery.  No Smoking including e-cigarettes for 24 hours prior to surgery. No chewable tobacco products for at least 6 hours prior to surgery. No nicotine patches on the day of surgery.  Notify your doctor if there is any change in your medical condition (cold, fever, infection).  Do not wear jewelry, make-up, hairpins, clips or nail polish.  Do not wear lotions, powders, or perfumes. You may wear deodorant.  Do not shave 48 hours prior to surgery. Men may shave face and neck.  Contacts and dentures may not be worn into surgery.  Do not bring valuables to the hospital. [facility] is not responsible for any belongings or valuables.   TAKE THESE MEDICATIONS THE MORNING OF SURGERY WITH A SIP OF WATER:  1.  LEVOTHYROXINE 2.  PANTROPRAZOLE (take one tablet the night before surgery and one tablet the morning of surgery - helps to prevent nausea after surgery)  Use CHG Soap as directed on instruction sheet.  Follow  recommendations from Cardiologist or PCP regarding stopping Aspirin, Plavix.  Stop Plavix 1 week prior to surgery, last dose today August 18, 2017.  Continue taking the baby aspirin.  Stop Anti-inflammatories such as Advil, Aleve, Ibuprofen, Motrin, Naproxen, Naprosyn, Goodie powder, or aspirin products. (May take Tylenol or Acetaminophen if needed.)  Stop ANY OVER THE COUNTER supplements until after surgery. (May continue multivitamin.)  If you are being discharged the day of surgery, you will not be allowed to drive home. You will need someone to drive you home and stay with you that night.   If you are taking public transportation, you will need to have a responsible adult to with you.  Please call the number above if you have any questions about these instructions.

## 2017-08-19 DIAGNOSIS — C3490 Malignant neoplasm of unspecified part of unspecified bronchus or lung: Secondary | ICD-10-CM | POA: Diagnosis not present

## 2017-08-26 ENCOUNTER — Telehealth: Payer: Self-pay

## 2017-08-26 NOTE — Telephone Encounter (Signed)
Spoke with the patient's wife and the patient would like to reschedule his surgery for today due to the weather. The patient is now scheduled for surgery at [facility] on 08/28/17. He will continue to hold his Plavix and stay on the 81 mg Aspirin. He is aware to call the day before surgery to get his arrival time. The OR has been notified of schedule change.

## 2017-08-27 MED ORDER — CEFAZOLIN SODIUM-DEXTROSE 2-4 GM/100ML-% IV SOLN
2.0000 g | INTRAVENOUS | Status: AC
  Administered 2017-08-28: 2 g via INTRAVENOUS

## 2017-08-28 ENCOUNTER — Ambulatory Visit: Payer: Medicare Other | Admitting: Certified Registered"

## 2017-08-28 ENCOUNTER — Other Ambulatory Visit: Payer: Self-pay

## 2017-08-28 ENCOUNTER — Ambulatory Visit
Admission: RE | Admit: 2017-08-28 | Discharge: 2017-08-28 | Disposition: A | Discharge status: Home / Self Care | Payer: Medicare Other | Source: Ambulatory Visit | Attending: General Surgery | Admitting: General Surgery

## 2017-08-28 ENCOUNTER — Encounter
Admission: RE | Disposition: A | Discharge status: Home / Self Care | Payer: Self-pay | Source: Ambulatory Visit | Attending: General Surgery

## 2017-08-28 DIAGNOSIS — N183 Chronic kidney disease, stage 3 (moderate): Secondary | ICD-10-CM | POA: Diagnosis not present

## 2017-08-28 DIAGNOSIS — K219 Gastro-esophageal reflux disease without esophagitis: Secondary | ICD-10-CM | POA: Diagnosis not present

## 2017-08-28 DIAGNOSIS — Z87891 Personal history of nicotine dependence: Secondary | ICD-10-CM | POA: Diagnosis not present

## 2017-08-28 DIAGNOSIS — I10 Essential (primary) hypertension: Secondary | ICD-10-CM | POA: Insufficient documentation

## 2017-08-28 DIAGNOSIS — Z7982 Long term (current) use of aspirin: Secondary | ICD-10-CM | POA: Insufficient documentation

## 2017-08-28 DIAGNOSIS — Z7902 Long term (current) use of antithrombotics/antiplatelets: Secondary | ICD-10-CM | POA: Insufficient documentation

## 2017-08-28 DIAGNOSIS — Z79890 Hormone replacement therapy (postmenopausal): Secondary | ICD-10-CM | POA: Insufficient documentation

## 2017-08-28 DIAGNOSIS — Z955 Presence of coronary angioplasty implant and graft: Secondary | ICD-10-CM | POA: Diagnosis not present

## 2017-08-28 DIAGNOSIS — I2510 Atherosclerotic heart disease of native coronary artery without angina pectoris: Secondary | ICD-10-CM | POA: Insufficient documentation

## 2017-08-28 DIAGNOSIS — Z79899 Other long term (current) drug therapy: Secondary | ICD-10-CM | POA: Insufficient documentation

## 2017-08-28 DIAGNOSIS — E039 Hypothyroidism, unspecified: Secondary | ICD-10-CM | POA: Insufficient documentation

## 2017-08-28 DIAGNOSIS — I252 Old myocardial infarction: Secondary | ICD-10-CM | POA: Diagnosis not present

## 2017-08-28 DIAGNOSIS — G4730 Sleep apnea, unspecified: Secondary | ICD-10-CM | POA: Insufficient documentation

## 2017-08-28 DIAGNOSIS — E785 Hyperlipidemia, unspecified: Secondary | ICD-10-CM | POA: Insufficient documentation

## 2017-08-28 DIAGNOSIS — I4891 Unspecified atrial fibrillation: Secondary | ICD-10-CM | POA: Insufficient documentation

## 2017-08-28 DIAGNOSIS — I129 Hypertensive chronic kidney disease with stage 1 through stage 4 chronic kidney disease, or unspecified chronic kidney disease: Secondary | ICD-10-CM | POA: Diagnosis not present

## 2017-08-28 DIAGNOSIS — K4091 Unilateral inguinal hernia, without obstruction or gangrene, recurrent: Secondary | ICD-10-CM | POA: Insufficient documentation

## 2017-08-28 DIAGNOSIS — C155 Malignant neoplasm of lower third of esophagus: Secondary | ICD-10-CM

## 2017-08-28 HISTORY — PX: INGUINAL HERNIA REPAIR: SHX194

## 2017-08-28 SURGERY — REPAIR, HERNIA, INGUINAL, ADULT
Anesthesia: General | Laterality: Right | Wound class: Clean

## 2017-08-28 MED ORDER — EPHEDRINE SULFATE 50 MG/ML IJ SOLN
INTRAMUSCULAR | Status: DC | PRN
  Administered 2017-08-28 (×2): 10 mg via INTRAVENOUS

## 2017-08-28 MED ORDER — ONDANSETRON HCL 4 MG/2ML IJ SOLN
INTRAMUSCULAR | Status: DC | PRN
  Administered 2017-08-28: 4 mg via INTRAVENOUS

## 2017-08-28 MED ORDER — DEXAMETHASONE SODIUM PHOSPHATE 10 MG/ML IJ SOLN
INTRAMUSCULAR | Status: AC
  Filled 2017-08-28: qty 1

## 2017-08-28 MED ORDER — FENTANYL CITRATE (PF) 100 MCG/2ML IJ SOLN: 25.0000 ug | INTRAMUSCULAR | Status: DC | PRN

## 2017-08-28 MED ORDER — LACTATED RINGERS IV SOLN
INTRAVENOUS | Status: DC
  Administered 2017-08-28: 10:00:00 via INTRAVENOUS

## 2017-08-28 MED ORDER — CEFAZOLIN SODIUM-DEXTROSE 2-4 GM/100ML-% IV SOLN
INTRAVENOUS | Status: DC
Start: 2017-08-28 — End: 2017-08-28
  Filled 2017-08-28: qty 100

## 2017-08-28 MED ORDER — BUPIVACAINE-EPINEPHRINE (PF) 0.5% -1:200000 IJ SOLN
INTRAMUSCULAR | Status: DC | PRN
  Administered 2017-08-28: 5 mL via PERINEURAL
  Administered 2017-08-28: 20 mL via PERINEURAL

## 2017-08-28 MED ORDER — ACETAMINOPHEN 10 MG/ML IV SOLN
INTRAVENOUS | Status: DC | PRN
Start: 2017-08-28 — End: 2017-08-28
  Administered 2017-08-28: 1000 mg via INTRAVENOUS

## 2017-08-28 MED ORDER — PROPOFOL 10 MG/ML IV BOLUS
INTRAVENOUS | Status: DC | PRN
  Administered 2017-08-28: 150 mg via INTRAVENOUS

## 2017-08-28 MED ORDER — FENTANYL CITRATE (PF) 100 MCG/2ML IJ SOLN
INTRAMUSCULAR | Status: DC | PRN
  Administered 2017-08-28: 50 ug via INTRAVENOUS

## 2017-08-28 MED ORDER — BUPIVACAINE-EPINEPHRINE (PF) 0.5% -1:200000 IJ SOLN
INTRAMUSCULAR | Status: AC
  Filled 2017-08-28: qty 30

## 2017-08-28 MED ORDER — ONDANSETRON HCL 4 MG/2ML IJ SOLN: 4.0000 mg | Freq: Once | INTRAMUSCULAR | Status: DC | PRN

## 2017-08-28 MED ORDER — CEFAZOLIN SODIUM-DEXTROSE 2-4 GM/100ML-% IV SOLN
INTRAVENOUS | Status: AC
  Filled 2017-08-28: qty 100

## 2017-08-28 MED ORDER — LACTATED RINGERS IV SOLN
INTRAVENOUS | Status: DC | PRN
  Administered 2017-08-28: 11:00:00 via INTRAVENOUS

## 2017-08-28 MED ORDER — MIDAZOLAM HCL 2 MG/2ML IJ SOLN
INTRAMUSCULAR | Status: AC
  Filled 2017-08-28: qty 2

## 2017-08-28 MED ORDER — ACETAMINOPHEN 10 MG/ML IV SOLN
INTRAVENOUS | Status: AC
  Filled 2017-08-28: qty 100

## 2017-08-28 MED ORDER — KETOROLAC TROMETHAMINE 30 MG/ML IJ SOLN
INTRAMUSCULAR | Status: AC
  Filled 2017-08-28: qty 2

## 2017-08-28 MED ORDER — HYDROCODONE-ACETAMINOPHEN 5-325 MG PO TABS: 1.0000 | ORAL_TABLET | ORAL | 0 refills | Status: DC | PRN

## 2017-08-28 MED ORDER — CLOPIDOGREL BISULFATE 75 MG PO TABS: 75.0000 mg | ORAL_TABLET | Freq: Every day | ORAL | 4 refills | Status: DC

## 2017-08-28 MED ORDER — ONDANSETRON HCL 4 MG/2ML IJ SOLN
INTRAMUSCULAR | Status: AC
  Filled 2017-08-28: qty 2

## 2017-08-28 MED ORDER — FENTANYL CITRATE (PF) 100 MCG/2ML IJ SOLN
INTRAMUSCULAR | Status: AC
  Filled 2017-08-28: qty 2

## 2017-08-28 MED ORDER — KETOROLAC TROMETHAMINE 30 MG/ML IJ SOLN
INTRAMUSCULAR | Status: DC | PRN
  Administered 2017-08-28: 30 mg

## 2017-08-28 MED ORDER — DEXAMETHASONE SODIUM PHOSPHATE 10 MG/ML IJ SOLN
INTRAMUSCULAR | Status: DC | PRN
  Administered 2017-08-28: 10 mg via INTRAVENOUS

## 2017-08-28 SURGICAL SUPPLY — 37 items
BLADE SURG 15 STRL SS SAFETY (BLADE) ×6 IMPLANT
CANISTER SUCT 1200ML W/VALVE (MISCELLANEOUS) ×3 IMPLANT
CHLORAPREP W/TINT 26ML (MISCELLANEOUS) ×3 IMPLANT
CLOSURE WOUND 1/2 X4 (GAUZE/BANDAGES/DRESSINGS) ×1
DECANTER SPIKE VIAL GLASS SM (MISCELLANEOUS) ×3 IMPLANT
DRAIN PENROSE 1/4X12 LTX (DRAIN) ×3 IMPLANT
DRAPE LAPAROTOMY 100X77 ABD (DRAPES) ×3 IMPLANT
DRSG TEGADERM 4X4.75 (GAUZE/BANDAGES/DRESSINGS) ×3 IMPLANT
DRSG TELFA 4X3 1S NADH ST (GAUZE/BANDAGES/DRESSINGS) ×3 IMPLANT
ELECT CAUTERY BLADE TIP 2.5 (TIP) ×3
ELECT REM PT RETURN 9FT ADLT (ELECTROSURGICAL) ×3
ELECTRODE CAUTERY BLDE TIP 2.5 (TIP) IMPLANT
ELECTRODE REM PT RTRN 9FT ADLT (ELECTROSURGICAL) ×1 IMPLANT
GLOVE BIO SURGEON STRL SZ7.5 (GLOVE) ×5 IMPLANT
GLOVE INDICATOR 8.0 STRL GRN (GLOVE) ×5 IMPLANT
GOWN STRL REUS W/ TWL LRG LVL3 (GOWN DISPOSABLE) ×2 IMPLANT
GOWN STRL REUS W/TWL LRG LVL3 (GOWN DISPOSABLE) ×6
KIT RM TURNOVER STRD PROC AR (KITS) ×3 IMPLANT
LABEL OR SOLS (LABEL) ×3 IMPLANT
MESH HERNIA 1.6X1.9 PLUG LRG (Mesh General) ×1 IMPLANT
MESH HERNIA PLUG LRG (Mesh General) ×2 IMPLANT
NDL HYPO 25X1 1.5 SAFETY (NEEDLE) ×1 IMPLANT
NEEDLE HYPO 22GX1.5 SAFETY (NEEDLE) ×6 IMPLANT
NEEDLE HYPO 25X1 1.5 SAFETY (NEEDLE) ×3 IMPLANT
PACK BASIN MINOR ARMC (MISCELLANEOUS) ×3 IMPLANT
STRIP CLOSURE SKIN 1/2X4 (GAUZE/BANDAGES/DRESSINGS) ×2 IMPLANT
SUT SURGILON 0 BLK (SUTURE) ×6 IMPLANT
SUT VIC AB 2-0 SH 27 (SUTURE) ×3
SUT VIC AB 2-0 SH 27XBRD (SUTURE) ×1 IMPLANT
SUT VIC AB 3-0 54X BRD REEL (SUTURE) ×1 IMPLANT
SUT VIC AB 3-0 BRD 54 (SUTURE) ×3
SUT VIC AB 3-0 SH 27 (SUTURE) ×3
SUT VIC AB 3-0 SH 27X BRD (SUTURE) ×1 IMPLANT
SUT VIC AB 4-0 FS2 27 (SUTURE) ×3 IMPLANT
SWABSTK COMLB BENZOIN TINCTURE (MISCELLANEOUS) ×3 IMPLANT
SYR 3ML LL SCALE MARK (SYRINGE) ×3 IMPLANT
SYR CONTROL 10ML (SYRINGE) ×6 IMPLANT

## 2017-08-28 NOTE — Anesthesia Postprocedure Evaluation (Signed)
Anesthesia Post Note  Patient: Corey Perry  Procedure(s) Performed: HERNIA REPAIR INGUINAL ADULT (Right )  Patient location during evaluation: PACU Anesthesia Type: General Level of consciousness: awake and alert Pain management: pain level controlled Vital Signs Assessment: post-procedure vital signs reviewed and stable Respiratory status: spontaneous breathing and respiratory function stable Cardiovascular status: stable Anesthetic complications: no     Last Vitals:  Vitals:   08/28/17 1019 08/28/17 1148  BP: (!) 185/81 (!) 143/72  Pulse: (!) 52 62  Resp: 14   Temp: (!) 36.4 C 36.5 C  SpO2: 100%     Last Pain:  Vitals:   08/28/17 1019  TempSrc: Temporal                 KEPHART,WILLIAM K

## 2017-08-28 NOTE — Anesthesia Post-op Follow-up Note (Signed)
Anesthesia QCDR form completed.        

## 2017-08-28 NOTE — Transfer of Care (Signed)
Immediate Anesthesia Transfer of Care Note  Patient: Corey Perry  Procedure(s) Performed: HERNIA REPAIR INGUINAL ADULT (Right )  Patient Location: PACU  Anesthesia Type:General  Level of Consciousness: awake, alert  and oriented  Airway & Oxygen Therapy: Patient Spontanous Breathing and Patient connected to face mask oxygen  Post-op Assessment: Report given to RN and Post -op Vital signs reviewed and stable  Post vital signs: Reviewed and stable  Last Vitals:  Vitals:   08/28/17 1019 08/28/17 1148  BP: (!) 185/81 (!) 143/72  Pulse: (!) 52 (!) 59  Resp: 14 18  Temp: (!) 36.4 C 36.5 C  SpO2: 100% 100%    Last Pain:  Vitals:   08/28/17 1019  TempSrc: Temporal         Complications: No apparent anesthesia complications

## 2017-08-28 NOTE — Anesthesia Procedure Notes (Signed)
Procedure Name: LMA Insertion Date/Time: 08/28/2017 10:52 AM Performed by: Hedda Slade, CRNA Pre-anesthesia Checklist: Patient identified, Patient being monitored, Timeout performed, Emergency Drugs available and Suction available Patient Re-evaluated:Patient Re-evaluated prior to induction Oxygen Delivery Method: Circle system utilized Preoxygenation: Pre-oxygenation with 100% oxygen Induction Type: IV induction Ventilation: Mask ventilation without difficulty LMA: LMA inserted LMA Size: 5.0 Tube type: Oral Number of attempts: 1 Placement Confirmation: positive ETCO2 and breath sounds checked- equal and bilateral Tube secured with: Tape Dental Injury: Teeth and Oropharynx as per pre-operative assessment

## 2017-08-28 NOTE — Op Note (Signed)
Preoperative diagnosis: Recurrent right inguinal hernia.  Postoperative diagnosis: Same.  Operative procedure: Repair of right direct inguinal hernia with large Bard PerFix plug.  Operating Surgeon: Hervey Ard, MD.  Anesthesia: General by LMA, Marcaine 0.5% with 1-200,000 units of epinephrine, 30 cc, Toradol 30 mg.  Estimated blood loss: Less than 5 cc.  Clinical note: This 71 year old male underwent repair of bilateral inguinal hernias in the 1980s.  He is developed a right recurrence.  He was admitted for elective repair.  Air was removed from the surgical site prior to presentation of the operating theater.  He received Kefzol prior to the procedure.  Operative note: With the patient under adequate general anesthesia the abdomen with ChloraPrep and draped.  Field block anesthesia was established.  Marcaine was infiltrated along the planned incision site.  A 5-6 cm incision along the anticipated course of the inguinal canal was carried down through skin subtendinous tissue.  Hemostasis was electrocautery.  The external oblique was opened.  Moderate scarring was noted.  The cord was mobilized.  No evidence of recurrent indirect hernia.  There was an obvious direct hernia.  The area was opened and the preperitoneal space cleared.  A large PerFix plug was placed.  This was anchored with a stitch to the pubic tubercle and then to the deep fascia with 0 Surgilon.  An onlay patch of polypropylene was laid along the inguinal floor.  This again was anchored to the pubic tubercle.  Interrupted 0 Surgilon sutures were placed along the inguinal ligament.  The medial and superior borders were anchored to the transverse abdominis aponeurosis.  Lateral slit was closed with a single stitch.  Toradol was placed into the wound.  The external oblique was closed with a running 2-0 Vicryl suture.  Scarpa's fascia was closed with a running 3-0 Vicryl suture.  The skin was closed with a running 4-0 Vicryl  subarticular suture.  Benzoin, Steri-Strips, Telfa and Tegaderm dressing applied.  The patient tolerated the procedure well and was taken to the recovery room in stable condition.

## 2017-08-28 NOTE — Anesthesia Preprocedure Evaluation (Signed)
Anesthesia Evaluation  Patient identified by MRN, date of birth, ID band Patient awake    Reviewed: Allergy & Precautions, H&P , NPO status , Patient's Chart, lab work & pertinent test results, reviewed documented beta blocker date and time   Airway Mallampati: II  TM Distance: <3 FB Neck ROM: full    Dental  (+) Teeth Intact   Pulmonary neg pulmonary ROS, sleep apnea and Continuous Positive Airway Pressure Ventilation , former smoker,    Pulmonary exam normal        Cardiovascular Exercise Tolerance: Good hypertension, + CAD and + Past MI  negative cardio ROS Normal cardiovascular exam+ dysrhythmias Atrial Fibrillation  Rate:Normal     Neuro/Psych negative neurological ROS  negative psych ROS   GI/Hepatic negative GI ROS, Neg liver ROS, GERD  Medicated,  Endo/Other  negative endocrine ROSHypothyroidism   Renal/GU Renal diseasenegative Renal ROS  negative genitourinary   Musculoskeletal   Abdominal   Peds  Hematology negative hematology ROS (+)   Anesthesia Other Findings   Reproductive/Obstetrics negative OB ROS                             Anesthesia Physical Anesthesia Plan  ASA: III  Anesthesia Plan: General LMA   Post-op Pain Management:    Induction:   PONV Risk Score and Plan: 3  Airway Management Planned:   Additional Equipment:   Intra-op Plan:   Post-operative Plan:   Informed Consent: I have reviewed the patients History and Physical, chart, labs and discussed the procedure including the risks, benefits and alternatives for the proposed anesthesia with the patient or authorized representative who has indicated his/her understanding and acceptance.     Plan Discussed with: CRNA  Anesthesia Plan Comments:         Anesthesia Quick Evaluation

## 2017-08-28 NOTE — H&P (Signed)
Healthy male for right inguinal hernia repair. No change in clinical condition since office exam.

## 2017-08-28 NOTE — Discharge Instructions (Signed)
Eye Surgery Discharge Instructions  Expect mild scratchy sensation or mild soreness. DO NOT RUB YOUR EYE!  The day of surgery:  Minimal physical activity, but bed rest is not required  No reading, computer work, or close hand work  No bending, lifting, or straining.  May watch TV  For 24 hours:  No driving, legal decisions, or alcoholic beverages  Safety precautions  Eat anything you prefer: It is better to start with liquids, then soup then solid foods.  _____ Eye patch should be worn until postoperative exam tomorrow.  ____ Solar shield eyeglasses should be worn for comfort in the sunlight/patch while sleeping  Resume all regular medications including aspirin or Coumadin if these were discontinued prior to surgery. You may shower, bathe, shave, or wash your hair. Tylenol may be taken for mild discomfort.  Call your doctor if you experience significant pain, nausea, or vomiting, fever > 101 or other signs of infection. [phone] or [phone] Specific instructions:  Follow-up Information    [facility] Follow up in 2 week(s).   Specialty:  General Surgery Contact information: [address] [place] [phone]

## 2017-08-29 ENCOUNTER — Encounter: Payer: Self-pay | Admitting: General Surgery

## 2017-09-08 ENCOUNTER — Other Ambulatory Visit: Payer: Self-pay

## 2017-09-08 ENCOUNTER — Emergency Department
Admission: EM | Admit: 2017-09-08 | Discharge: 2017-09-08 | Disposition: A | Discharge status: Home / Self Care | Payer: Medicare Other | Attending: Emergency Medicine | Admitting: Emergency Medicine

## 2017-09-08 ENCOUNTER — Encounter: Payer: Self-pay | Admitting: Emergency Medicine

## 2017-09-08 DIAGNOSIS — I2510 Atherosclerotic heart disease of native coronary artery without angina pectoris: Secondary | ICD-10-CM | POA: Insufficient documentation

## 2017-09-08 DIAGNOSIS — N183 Chronic kidney disease, stage 3 (moderate): Secondary | ICD-10-CM | POA: Diagnosis not present

## 2017-09-08 DIAGNOSIS — Z87891 Personal history of nicotine dependence: Secondary | ICD-10-CM | POA: Insufficient documentation

## 2017-09-08 DIAGNOSIS — E039 Hypothyroidism, unspecified: Secondary | ICD-10-CM | POA: Diagnosis not present

## 2017-09-08 DIAGNOSIS — Z7982 Long term (current) use of aspirin: Secondary | ICD-10-CM | POA: Diagnosis not present

## 2017-09-08 DIAGNOSIS — K6289 Other specified diseases of anus and rectum: Secondary | ICD-10-CM | POA: Diagnosis present

## 2017-09-08 DIAGNOSIS — I252 Old myocardial infarction: Secondary | ICD-10-CM | POA: Diagnosis not present

## 2017-09-08 DIAGNOSIS — Z7902 Long term (current) use of antithrombotics/antiplatelets: Secondary | ICD-10-CM | POA: Insufficient documentation

## 2017-09-08 DIAGNOSIS — I129 Hypertensive chronic kidney disease with stage 1 through stage 4 chronic kidney disease, or unspecified chronic kidney disease: Secondary | ICD-10-CM | POA: Diagnosis not present

## 2017-09-08 DIAGNOSIS — Z79899 Other long term (current) drug therapy: Secondary | ICD-10-CM | POA: Insufficient documentation

## 2017-09-08 DIAGNOSIS — K643 Fourth degree hemorrhoids: Secondary | ICD-10-CM | POA: Insufficient documentation

## 2017-09-08 MED ORDER — HYDROCORTISONE ACETATE 25 MG RE SUPP: 25.0000 mg | Freq: Two times a day (BID) | RECTAL | 0 refills | Status: DC

## 2017-09-08 NOTE — ED Triage Notes (Signed)
Pt reports rectal pain and hemorrhoid that began two days ago. Denies any rectal bleeding. Pt reports has used OTC medication without relief. Ambulatory to triage, no apparent distress noted.

## 2017-09-08 NOTE — ED Notes (Signed)
Pt reports hemorrhoids for the last 24 hours - had hemorrhoid lanced last June and he feels the same now

## 2017-09-08 NOTE — ED Provider Notes (Signed)
[facility] Emergency Department Provider Note  ____________________________________________  Time seen: Approximately 9:16 PM  I have reviewed the triage vital signs and the nursing notes.   HISTORY  Chief Complaint Rectal Pain and Hemorrhoids    HPI Corey Perry is a 71 y.o. male presents to the emergency department with rectal pain.  Patient has perceived hemorrhoids.  Patient underwent incision and drainage for a thrombosed hemorrhoid approximately 1 year ago.  Patient reports that he has significant pain with ambulation and pain is fairly improved with rest.  No alleviating measures have been attempted.   Past Medical History:  Diagnosis Date  . Abnormal white blood cell (WBC) count    seeing Dr. [name] at [place] on 10/25  . Atrial fibrillation ([place])   . BPH (benign prostatic hypertrophy)   . Bradycardia   . CAD (coronary artery disease)   . Dysrhythmia    bradycardia - sees Dr. Humphrey Rolls  . Esophageal cancer ([place]) 05/2016  . GERD (gastroesophageal reflux disease)   . History of kidney stones   . Hyperlipidemia   . Hypertension   . Hypothyroidism   . Myocardial infarction ([place]) 2005  . S/P angioplasty with stent 2005   x 4 vessels  . Sleep apnea    mild-NO CPAP  . Spinal stenosis    lumbar    Patient Active Problem List   Diagnosis Date Noted  . Advanced care planning/counseling discussion 07/29/2017  . Recurrent inguinal hernia of right side without obstruction or gangrene 07/29/2017  . Nephrolithiasis 02/02/2017  . Esophageal cancer ([place]) 06/23/2016  . Leukopenia 04/10/2016  . Thrombocytopenia ([place]) 04/10/2016  . Hypertensive kidney disease with CKD stage III ([place]) 06/22/2015  . CAD (coronary artery disease) 06/21/2015  . BPH (benign prostatic hyperplasia) 06/21/2015  . Acid reflux 06/21/2015  . Hypothyroid   . Hyperlipidemia   . Hypertension     Past Surgical History:  Procedure Laterality Date  . BACK SURGERY    .  CARDIAC CATHETERIZATION Left 03/12/2016   Procedure: Left Heart Cath and Coronary Angiography;  Surgeon: [name], MD;  Location: [place] CV LAB;  Service: Cardiovascular;  Laterality: Left;  . CARDIAC CATHETERIZATION N/A 03/12/2016   Procedure: Coronary Stent Intervention;  Surgeon: Yolonda Kida, MD;  Location: [place] CV LAB;  Service: Cardiovascular;  Laterality: N/A;  . COLONOSCOPY WITH PROPOFOL N/A 07/13/2015   Procedure: COLONOSCOPY WITH PROPOFOL;  Surgeon: Lucilla Lame, MD;  Location: [place];  Service: Endoscopy;  Laterality: N/A;  . CORONARY ANGIOPLASTY  10/05 and 12/05   4 DES placed  . ESOPHAGECTOMY  10/07/2016   @ [facility]  . ESOPHAGOGASTRODUODENOSCOPY (EGD) WITH PROPOFOL N/A 06/14/2016   Procedure: ESOPHAGOGASTRODUODENOSCOPY (EGD) WITH PROPOFOL;  Surgeon: [name], MD;  Location: [facility] END[facility]COPY;  Service: Endoscopy;  Laterality: N/A;  . HERNIA REPAIR Bilateral    x3  . INGUINAL HERNIA REPAIR Right 08/28/2017   Procedure: HERNIA REPAIR INGUINAL ADULT;  Surgeon: Robert Bellow, MD;  Location: [facility] ORS;  Service: General;  Laterality: Right;  recurrent  . POLYPECTOMY  07/13/2015   Procedure: POLYPECTOMY;  Surgeon: Lucilla Lame, MD;  Location: [place];  Service: Endoscopy;;  . PORT-A-CATH REMOVAL N/A 01/16/2017   Procedure: REMOVAL PORT-A-CATH;  Surgeon: Olean Ree, MD;  Location: [facility] ORS;  Service: General;  Laterality: N/A;  . PORTACATH PLACEMENT N/A 07/08/2016   Procedure: INSERTION PORT-A-CATH;  Surgeon: Olean Ree, MD;  Location: [facility] ORS;  Service: General;  Laterality: N/A;  Prior to Admission medications   Medication Sig Start Date End Date Taking? Authorizing Provider  acetaminophen (TYLENOL) 500 MG tablet Take 500 mg by mouth every 6 (six) hours as needed for mild pain or moderate pain.    [provider]  aspirin 81 MG tablet Take 81 mg by mouth daily. AM    [provider]  clopidogrel (PLAVIX)  75 MG tablet Take 1 tablet (75 mg total) by mouth daily. 09/04/17   Robert Bellow, MD  HYDROcodone-acetaminophen (NORCO/VICODIN) 5-325 MG tablet Take 1 tablet by mouth every 4 (four) hours as needed for moderate pain. 08/28/17 08/28/18  Robert Bellow, MD  hydrocortisone (ANUSOL-HC) 25 MG suppository Place 1 suppository (25 mg total) rectally 2 (two) times daily. 09/08/17   Lannie Fields, PA-C  isosorbide mononitrate (IMDUR) 30 MG 24 hr tablet Take 30 mg by mouth daily. Takes at night 06/12/15   [provider]  levothyroxine (SYNTHROID, LEVOTHROID) 50 MCG tablet Take 1 tablet (50 mcg total) daily by mouth. 07/29/17   [name], MD  lisinopril (PRINIVIL,ZESTRIL) 10 MG tablet Take 1 tablet (10 mg total) every morning by mouth. 07/29/17   Crissman, Jeannette How, MD  multivitamin-iron-minerals-folic acid (CENTRUM) chewable tablet Chew 1 tablet by mouth daily.    [provider]  nitroGLYCERIN (NITROSTAT) 0.4 MG SL tablet Place 1 tablet under the tongue every 5 (five) minutes as needed for chest pain.  03/08/16   [provider]  pantoprazole (PROTONIX) 20 MG tablet Take 1 tablet (20 mg total) daily by mouth. 07/29/17   [name], MD  simvastatin (ZOCOR) 40 MG tablet Take 1 tablet (40 mg total) daily by mouth. 07/29/17   [name], MD    Allergies Patient has no known allergies.  Family History  Problem Relation Age of Onset  . Hypertension Mother   . Dementia Mother   . Heart disease Father   . Hypertension Father   . Kidney disease Father   . Hypertension Brother   . Heart disease Brother   . Diabetes Son   . Hypertension Son   . Hyperlipidemia Son   . Hyperlipidemia Daughter   . Hypertension Daughter     Social History Social History   Tobacco Use  . Smoking status: Former Smoker    Packs/day: 1.00    Years: 40.00    Pack years: 40.00    Types: Cigarettes    Last attempt to quit: 07/16/2004    Years since quitting: 13.1  .  Smokeless tobacco: Former Systems developer    Types: Chew  Substance Use Topics  . Alcohol use: No    Alcohol/week: 0.0 oz  . Drug use: No     Review of Systems  Constitutional: No fever/chills Eyes: No visual changes. No discharge ENT: No upper respiratory complaints. Cardiovascular: no chest pain. Respiratory: no cough. No SOB. Gastrointestinal: No abdominal pain.  No nausea, no vomiting.  No diarrhea.  No constipation. Musculoskeletal: Negative for musculoskeletal pain. Skin: Patient has grade 4 hemorrhoid. Neurological: Negative for headaches, focal weakness or numbness.   ____________________________________________   PHYSICAL EXAM:  VITAL SIGNS: ED Triage Vitals  Enc Vitals Group     BP 09/08/17 1807 (!) 149/81     Pulse Rate 09/08/17 1807 60     Resp 09/08/17 1807 18     Temp 09/08/17 1807 97.7 F (36.5 C)     Temp Source 09/08/17 1807 Oral     SpO2 09/08/17 1807 100 %  Weight 09/08/17 1807 160 lb (72.6 kg)     Height 09/08/17 1807 5\' 9"  (1.753 m)     Head Circumference --      Peak Flow --      Pain Score 09/08/17 1812 8     Pain Loc --      Pain Edu? --      Excl. in [place]? --      Constitutional: Alert and oriented. Well appearing and in no acute distress. Eyes: Conjunctivae are normal. PERRL. EOMI. Head: Atraumatic.  Cardiovascular: Normal rate, regular rhythm. Normal S1 and S2.  Good peripheral circulation. Respiratory: Normal respiratory effort without tachypnea or retractions. Lungs CTAB. Good air entry to the bases with no decreased or absent breath sounds. Musculoskeletal: Full range of motion to all extremities. No gross deformities appreciated. Neurologic:  Normal speech and language. No gross focal neurologic deficits are appreciated.  Skin: Patient has grade 4 hemorrhoid without thrombosis. Psychiatric: Mood and affect are normal. Speech and behavior are normal. Patient exhibits appropriate insight and  judgement.   ____________________________________________   LABS (all labs ordered are listed, but only abnormal results are displayed)  Labs Reviewed - No data to display ____________________________________________  EKG   ____________________________________________  RADIOLOGY   No results found.  ____________________________________________    PROCEDURES  Procedure(s) performed:     Medications - No data to display   ____________________________________________   INITIAL IMPRESSION / ASSESSMENT AND PLAN / ED COURSE  Pertinent labs & imaging results that were available during my care of the patient were reviewed by me and considered in my medical decision making (see chart for details).  Review of the [place] CSRS was performed in accordance of the [place] prior to dispensing any controlled drugs.     Assessment and Plan:  Hemorrhoid Patient presents to the emergency department with a stage IV nonthrombosed hemorrhoid.  During physical exam, hemorrhoid became compromised and started draining.  Hemorrhoid was evacuated by myself and hemostasis was quickly achieved.  Vital signs were reassuring prior to discharge.  Patient was sent home with Anusol.  All patient questions were answered.  ____________________________________________  FINAL CLINICAL IMPRESSION(S) / ED DIAGNOSES  Final diagnoses:  Grade IV hemorrhoids      NEW MEDICATIONS STARTED DURING THIS VISIT:  ED Discharge Orders        Ordered    hydrocortisone (ANUSOL-HC) 25 MG suppository  2 times daily     09/08/17 1933          This chart was dictated using voice recognition software/Dragon. Despite best efforts to proofread, errors can occur which can change the meaning. Any change was purely unintentional.      Lannie Fields, PA-C 09/09/17 Alyson Ingles    Delman Kitten, MD 09/09/17 [phone]

## 2017-09-08 NOTE — ED Triage Notes (Signed)
First Nurse Note:  Arrives with c/o rectal pain x 1 day.  Patient states he has a hemorrhoid that needs to be drained.  Patient recently had inguinal hernia repair.  AAOx3.  Skin warm and dry. NAD

## 2017-09-10 ENCOUNTER — Ambulatory Visit (INDEPENDENT_AMBULATORY_CARE_PROVIDER_SITE_OTHER): Payer: Medicare Other | Admitting: General Surgery

## 2017-09-10 ENCOUNTER — Encounter: Payer: Self-pay | Admitting: General Surgery

## 2017-09-10 VITALS — BP 138/62 | HR 71 | Resp 13 | Ht 69.0 in | Wt 162.0 lb

## 2017-09-10 DIAGNOSIS — K4091 Unilateral inguinal hernia, without obstruction or gangrene, recurrent: Secondary | ICD-10-CM

## 2017-09-10 NOTE — Patient Instructions (Addendum)
The patient is aware to call back for any questions or concerns.    Proper lifting techniques reviewed. Resume activities as tolerated.

## 2017-09-10 NOTE — Progress Notes (Signed)
Patient ID: Corey Perry, male   DOB: [birth year], 71 y.o.   MRN: [id number]  Chief Complaint  Patient presents with  . Routine Post Op    HPI Corey Perry is a 71 y.o. male.  Here for postoperative visit, right inguinal hernia on 08-28-17. He did go to the ED for a hemorrhoid and abscess that was lanced. He feels he is better.  He is here with his wife, Aristidis Talerico.  HPI  Past Medical History:  Diagnosis Date  . Abnormal white blood cell (WBC) count    seeing Dr. [name] at [place] on 10/25  . Atrial fibrillation ([place])   . BPH (benign prostatic hypertrophy)   . Bradycardia   . CAD (coronary artery disease)   . Dysrhythmia    bradycardia - sees Dr. Humphrey Rolls  . Esophageal cancer ([place]) 05/2016  . GERD (gastroesophageal reflux disease)   . History of kidney stones   . Hyperlipidemia   . Hypertension   . Hypothyroidism   . Myocardial infarction ([place]) 2005  . S/P angioplasty with stent 2005   x 4 vessels  . Sleep apnea    mild-NO CPAP  . Spinal stenosis    lumbar    Past Surgical History:  Procedure Laterality Date  . BACK SURGERY    . CARDIAC CATHETERIZATION Left 03/12/2016   Procedure: Left Heart Cath and Coronary Angiography;  Surgeon: [name], MD;  Location: [place] [facility];  Service: Cardiovascular;  Laterality: Left;  . CARDIAC CATHETERIZATION N/A 03/12/2016   Procedure: Coronary Stent Intervention;  Surgeon: Yolonda Kida, MD;  Location: [place] [facility];  Service: Cardiovascular;  Laterality: N/A;  . COLONOSCOPY WITH PROPOFOL N/A 07/13/2015   Procedure: COLONOSCOPY WITH PROPOFOL;  Surgeon: Lucilla Lame, MD;  Location: [place];  Service: Endoscopy;  Laterality: N/A;  . CORONARY ANGIOPLASTY  10/05 and 12/05   4 DES placed  . ESOPHAGECTOMY  10/07/2016   @ [facility]  . ESOPHAGOGASTRODUODENOSCOPY (EGD) WITH PROPOFOL N/A 06/14/2016   Procedure: ESOPHAGOGASTRODUODENOSCOPY (EGD) WITH PROPOFOL;  Surgeon: [name], MD;   Location: [facility] ENDOSCOPY;  Service: Endoscopy;  Laterality: N/A;  . HERNIA REPAIR Bilateral    x3  . INGUINAL HERNIA REPAIR Right 08/28/2017   Procedure: HERNIA REPAIR INGUINAL ADULT;  Surgeon: Robert Bellow, MD;  Location: [facility];  Service: General;  Laterality: Right;  recurrent  . POLYPECTOMY  07/13/2015   Procedure: POLYPECTOMY;  Surgeon: Lucilla Lame, MD;  Location: [place];  Service: Endoscopy;;  . PORT-A-CATH REMOVAL N/A 01/16/2017   Procedure: REMOVAL PORT-A-CATH;  Surgeon: Olean Ree, MD;  Location: [facility];  Service: General;  Laterality: N/A;  . PORTACATH PLACEMENT N/A 07/08/2016   Procedure: INSERTION PORT-A-CATH;  Surgeon: Olean Ree, MD;  Location: [facility] OR[facility];  Service: General;  Laterality: N/A;    Family History  Problem Relation Age of Onset  . Hypertension Mother   . Dementia Mother   . Heart disease Father   . Hypertension Father   . Kidney disease Father   . Hypertension Brother   . Heart disease Brother   . Diabetes Son   . Hypertension Son   . Hyperlipidemia Son   . Hyperlipidemia Daughter   . Hypertension Daughter     Social History Social History   Tobacco Use  . Smoking status: Former Smoker    Packs/day: 1.00    Years: 40.00    Pack years: 40.00    Types: Cigarettes  Last attempt to quit: 07/16/2004    Years since quitting: 13.1  . Smokeless tobacco: Former Systems developer    Types: Chew  Substance Use Topics  . Alcohol use: No    Alcohol/week: 0.0 oz  . Drug use: No    No Known Allergies  Current Outpatient Medications  Medication Sig Dispense Refill  . acetaminophen (TYLENOL) 500 MG tablet Take 500 mg by mouth every 6 (six) hours as needed for mild pain or moderate pain.    Marland Kitchen aspirin 81 MG tablet Take 81 mg by mouth daily. AM    . clopidogrel (PLAVIX) 75 MG tablet Take 1 tablet (75 mg total) by mouth daily. 90 tablet 4  . HYDROcodone-acetaminophen (NORCO/VICODIN) 5-325 MG tablet Take 1 tablet by mouth every 4 (four) hours as  needed for moderate pain. 20 tablet 0  . isosorbide mononitrate (IMDUR) 30 MG 24 hr tablet Take 30 mg by mouth daily. Takes at night  5  . levothyroxine (SYNTHROID, LEVOTHROID) 50 MCG tablet Take 1 tablet (50 mcg total) daily by mouth. 90 tablet 4  . lisinopril (PRINIVIL,ZESTRIL) 10 MG tablet Take 1 tablet (10 mg total) every morning by mouth. 90 tablet 4  . multivitamin-iron-minerals-folic acid (CENTRUM) chewable tablet Chew 1 tablet by mouth daily.    . nitroGLYCERIN (NITROSTAT) 0.4 MG SL tablet Place 1 tablet under the tongue every 5 (five) minutes as needed for chest pain.   98  . pantoprazole (PROTONIX) 20 MG tablet Take 1 tablet (20 mg total) daily by mouth. 90 tablet 4  . simvastatin (ZOCOR) 40 MG tablet Take 1 tablet (40 mg total) daily by mouth. 90 tablet 4   No current facility-administered medications for this visit.     Review of Systems Review of Systems  Constitutional: Negative.   Respiratory: Negative.   Cardiovascular: Negative.   Gastrointestinal: Negative for constipation and diarrhea.    Blood pressure 138/62, pulse 71, resp. rate 13, height 5\' 9"  (1.753 m), weight 162 lb (73.5 kg).  Physical Exam Physical Exam  Constitutional: He is oriented to person, place, and time. He appears well-developed and well-nourished.  Abdominal: No hernia.  Hernia repair intact, mild swelling.  Genitourinary:        Genitourinary Comments: Hemorrhoid area clean  Neurological: He is alert and oriented to person, place, and time.  Skin: Skin is warm and dry.  Psychiatric: His behavior is normal.    Data Reviewed ED record.  Assessment    Doing well status post right inguinal hernia repair and subsequent thrombosed hemorrhoid incision and drainage.    Plan         Proper lifting techniques reviewed. Resume activities as tolerated.  Follow up in one month.  HPI, Physical Exam, Assessment and Plan have been scribed under the direction and in the presence of  Robert Bellow, MD. Karie Fetch, RN  I have completed the exam and reviewed the above documentation for accuracy and completeness.  I agree with the above.  Haematologist has been used and any errors in dictation or transcription are unintentional.  Hervey Ard, M.D., F.A.C.S.  Robert Bellow 09/10/2017, 9:09 PM

## 2017-09-19 ENCOUNTER — Other Ambulatory Visit: Payer: Self-pay | Admitting: *Deleted

## 2017-09-19 DIAGNOSIS — C159 Malignant neoplasm of esophagus, unspecified: Secondary | ICD-10-CM

## 2017-09-21 NOTE — Progress Notes (Signed)
[place]  [phone] [phone]) [phone]  ID: [name] OB: 01-12-1946  MR#: [id number]  VPX#:[id number]  Patient Care Team: [name], MD as PCP - General (Family Medicine) [name], MD as PCP - Family Medicine (Family Medicine) [name], MD as Consulting Physician (Oncology) [name], MD as Referring Physician  CHIEF COMPLAINT: Stage IIb adenocarcinoma of the lower third of the esophagus.  INTERVAL HISTORY: Patient returns to clinic today for repeat laboratory work and further evaluation. He continues to feel well and is asymptomatic. He does not complain of dysphasia or difficulty swallowing today. He denies any recent fevers or illnesses. He has no neurologic complaints. He denies any chest pain or shortness of breath. He denies any nausea, vomiting, constipation, or diarrhea. He reports nocturia 2-3 times per night which is chronic, no other urinary complaints. Patient offers no specific complaints today.  REVIEW OF SYSTEMS:   Review of Systems  Constitutional: Negative for fever, malaise/fatigue and weight loss.  HENT: Negative.   Respiratory: Negative.  Negative for cough and shortness of breath.   Cardiovascular: Negative.  Negative for chest pain and leg swelling.  Gastrointestinal: Negative for abdominal pain, blood in stool, constipation, diarrhea, melena, nausea and vomiting.  Genitourinary: Negative for frequency and urgency.       Nocturia  Musculoskeletal: Negative.   Neurological: Negative.  Negative for tingling, weakness and headaches.  Psychiatric/Behavioral: The patient does not have insomnia.     As per HPI. Otherwise, a complete review of systems is negative.  PAST MEDICAL HISTORY: Past Medical History:  Diagnosis Date  . Abnormal white blood cell (WBC) count    seeing Dr. [name] at [place] on 10/25  . Atrial fibrillation ([place])   . BPH (benign prostatic hypertrophy)   .  Bradycardia   . CAD (coronary artery disease)   . Dysrhythmia    bradycardia - sees Dr. Humphrey Rolls  . Esophageal cancer ([place]) 05/2016  . GERD (gastroesophageal reflux disease)   . History of kidney stones   . Hyperlipidemia   . Hypertension   . Hypothyroidism   . Myocardial infarction ([place]) 2005  . S/P angioplasty with stent 2005   x 4 vessels  . Sleep apnea    mild-NO CPAP  . Spinal stenosis    lumbar    PAST SURGICAL HISTORY: Past Surgical History:  Procedure Laterality Date  . BACK SURGERY    . CARDIAC CATHETERIZATION Left 03/12/2016   Procedure: Left Heart Cath and Coronary Angiography;  Surgeon: [name], MD;  Location: [facility];  Service: Cardiovascular;  Laterality: Left;  . CARDIAC CATHETERIZATION N/A 03/12/2016   Procedure: Coronary Stent Intervention;  Surgeon: Yolonda Kida, MD;  Location: [facility];  Service: Cardiovascular;  Laterality: N/A;  . COLONOSCOPY WITH PROPOFOL N/A 07/13/2015   Procedure: COLONOSCOPY WITH PROPOFOL;  Surgeon: Lucilla Lame, MD;  Location: [place] [facility];  Service: Endoscopy;  Laterality: N/A;  . CORONARY ANGIOPLASTY  10/05 and 12/05   4 DES placed  . ESOPHAGECTOMY  10/07/2016   @ [facility]  . ESOPHAGOGASTRODUODENOSCOPY (EGD) WITH PROPOFOL N/A 06/14/2016   Procedure: ESOPHAGOGASTRODUODENOSCOPY (EGD) WITH PROPOFOL;  Surgeon: [name], MD;  Location: [facility];  Service: Endoscopy;  Laterality: N/A;  . HERNIA REPAIR Bilateral    x3  . INGUINAL HERNIA REPAIR Right 08/28/2017   Procedure: HERNIA REPAIR INGUINAL ADULT;  Surgeon: Robert Bellow, MD;  Location: [facility];  Service: General;  Laterality:  Right;  recurrent  . POLYPECTOMY  07/13/2015   Procedure: POLYPECTOMY;  Surgeon: Lucilla Lame, MD;  Location: [facility];  Service: Endoscopy;;  . PORT-A-CATH REMOVAL N/A 01/16/2017   Procedure: REMOVAL PORT-A-CATH;  Surgeon: Olean Ree, MD;  Location: [facility];  Service: General;  Laterality: N/A;    . PORTACATH PLACEMENT N/A 07/08/2016   Procedure: INSERTION PORT-A-CATH;  Surgeon: Olean Ree, MD;  Location: [facility];  Service: General;  Laterality: N/A;    FAMILY HISTORY Family History  Problem Relation Age of Onset  . Hypertension Mother   . Dementia Mother   . Heart disease Father   . Hypertension Father   . Kidney disease Father   . Hypertension Brother   . Heart disease Brother   . Diabetes Son   . Hypertension Son   . Hyperlipidemia Son   . Hyperlipidemia Daughter   . Hypertension Daughter        ADVANCED DIRECTIVES:    HEALTH MAINTENANCE: Social History   Tobacco Use  . Smoking status: Former Smoker    Packs/day: 1.00    Years: 40.00    Pack years: 40.00    Types: Cigarettes    Last attempt to quit: 07/16/2004    Years since quitting: 13.2  . Smokeless tobacco: Former Systems developer    Types: Chew  Substance Use Topics  . Alcohol use: No    Alcohol/week: 0.0 oz  . Drug use: No     Colonoscopy:  PAP:  Bone density:  Lipid panel:  No Known Allergies  Current Outpatient Medications  Medication Sig Dispense Refill  . acetaminophen (TYLENOL) 500 MG tablet Take 500 mg by mouth every 6 (six) hours as needed for mild pain or moderate pain.    Marland Kitchen aspirin 81 MG tablet Take 81 mg by mouth daily. AM    . atorvastatin (LIPITOR) 40 MG tablet Take 40 mg by mouth daily.    . clopidogrel (PLAVIX) 75 MG tablet Take 1 tablet (75 mg total) by mouth daily. 90 tablet 4  . HYDROcodone-acetaminophen (NORCO/VICODIN) 5-325 MG tablet Take 1 tablet by mouth every 4 (four) hours as needed for moderate pain. 20 tablet 0  . isosorbide mononitrate (IMDUR) 30 MG 24 hr tablet Take 30 mg by mouth daily. Takes at night  5  . levothyroxine (SYNTHROID, LEVOTHROID) 50 MCG tablet Take 1 tablet (50 mcg total) daily by mouth. 90 tablet 4  . lisinopril (PRINIVIL,ZESTRIL) 10 MG tablet Take 1 tablet (10 mg total) every morning by mouth. 90 tablet 4  . metoprolol succinate (TOPROL-XL) 25 MG 24 hr  tablet Take 25 mg by mouth daily.    . multivitamin-iron-minerals-folic acid (CENTRUM) chewable tablet Chew 1 tablet by mouth daily.    . nitroGLYCERIN (NITROSTAT) 0.4 MG SL tablet Place 1 tablet under the tongue every 5 (five) minutes as needed for chest pain.   98  . pantoprazole (PROTONIX) 20 MG tablet Take 1 tablet (20 mg total) daily by mouth. 90 tablet 4  . simvastatin (ZOCOR) 40 MG tablet Take 1 tablet (40 mg total) daily by mouth. 90 tablet 4   No current facility-administered medications for this visit.     OBJECTIVE: Vitals:   09/23/17 1435  BP: 115/61  Pulse: (!) 52  Resp: 18  Temp: (!) 97.2 F (36.2 C)     Body mass index is 23.67 kg/m.    ECOG FS:0 - Asymptomatic  General: Well-developed, well-nourished, no acute distress. Eyes: Pink conjunctiva, anicteric sclera. Lungs: Clear to auscultation  bilaterally. Heart: Regular rate and rhythm. No rubs, murmurs, or gallops. Abdomen: Soft, nontender, nondistended. No organomegaly noted, normoactive bowel sounds. Musculoskeletal: No edema, cyanosis, or clubbing. Neuro: Alert, answering all questions appropriately. Cranial nerves grossly intact. Skin: No rashes or petechiae noted. Psych: Normal affect.   LAB RESULTS:  Lab Results  Component Value Date   NA 139 09/23/2017   K 4.0 09/23/2017   CL 105 09/23/2017   CO2 26 09/23/2017   GLUCOSE 123 (H) 09/23/2017   BUN 18 09/23/2017   CREATININE 1.20 09/23/2017   CALCIUM 8.7 (L) 09/23/2017   PROT 7.0 09/23/2017   ALBUMIN 4.0 09/23/2017   AST 26 09/23/2017   ALT 20 09/23/2017   ALKPHOS 79 09/23/2017   BILITOT 0.5 09/23/2017   GFRNONAA 59 (L) 09/23/2017   GFRAA >60 09/23/2017    Lab Results  Component Value Date   WBC 3.1 (L) 09/23/2017   NEUTROABS 1.9 09/23/2017   HGB 12.3 (L) 09/23/2017   HCT 37.1 (L) 09/23/2017   MCV 97.1 09/23/2017   PLT 152 09/23/2017     STUDIES: No results found.  ASSESSMENT: Pathologic Stage IIb (T3, N0, M0) adenocarcinoma of the  lower third of the esophagus.  PLAN:    1. Stage IIb adenocarcinoma of the lower third of the esophagus: Patient had EUS completed at [facility] confirming the stage and diagnosis. He completed 6 cycles of weekly carboplatinum and Taxol completed on August 21, 2016 and daily XRT completed on August 27, 2016. Patient had his esophagectomy on October 07, 2016.  His most recent CT scan at [facility] on August 05, 2017 did not reveal any evidence of progressive or recurrent disease. Return to clinic in 6 months for further evaluation.  2. Thrombocytopenia: Resolved. Previously entire workup did not reveal a distinct etiology. No bone marrow biopsy has been performed to this point. Continue to monitor closely.  4. Renal insufficiency: Resolved. 5. Nocturia: Consider urology referral if symptoms continue to be disruptive. 6.  Leukopenia: Mild, monitor.  Patient expressed understanding and was in agreement with this plan. He also understands that He can call clinic at any time with any questions, concerns, or complaints.    [name], MD 09/27/17 8:52 AM

## 2017-09-23 ENCOUNTER — Inpatient Hospital Stay: Payer: Medicare Other | Attending: Oncology

## 2017-09-23 ENCOUNTER — Inpatient Hospital Stay (HOSPITAL_BASED_OUTPATIENT_CLINIC_OR_DEPARTMENT_OTHER): Payer: Medicare Other | Admitting: Oncology

## 2017-09-23 VITALS — BP 115/61 | HR 52 | Temp 97.2°F | Resp 18 | Wt 160.3 lb

## 2017-09-23 DIAGNOSIS — Z87891 Personal history of nicotine dependence: Secondary | ICD-10-CM | POA: Diagnosis not present

## 2017-09-23 DIAGNOSIS — Z7982 Long term (current) use of aspirin: Secondary | ICD-10-CM | POA: Diagnosis not present

## 2017-09-23 DIAGNOSIS — Z8501 Personal history of malignant neoplasm of esophagus: Secondary | ICD-10-CM | POA: Diagnosis not present

## 2017-09-23 DIAGNOSIS — I10 Essential (primary) hypertension: Secondary | ICD-10-CM | POA: Diagnosis not present

## 2017-09-23 DIAGNOSIS — D72819 Decreased white blood cell count, unspecified: Secondary | ICD-10-CM

## 2017-09-23 DIAGNOSIS — R351 Nocturia: Secondary | ICD-10-CM

## 2017-09-23 DIAGNOSIS — G4730 Sleep apnea, unspecified: Secondary | ICD-10-CM

## 2017-09-23 DIAGNOSIS — Z79899 Other long term (current) drug therapy: Secondary | ICD-10-CM | POA: Diagnosis not present

## 2017-09-23 DIAGNOSIS — C155 Malignant neoplasm of lower third of esophagus: Secondary | ICD-10-CM

## 2017-09-23 DIAGNOSIS — D696 Thrombocytopenia, unspecified: Secondary | ICD-10-CM

## 2017-09-23 DIAGNOSIS — C159 Malignant neoplasm of esophagus, unspecified: Secondary | ICD-10-CM

## 2017-09-23 LAB — CBC WITH DIFFERENTIAL/PLATELET
BASOS ABS: 0 10*3/uL (ref 0–0.1)
BASOS PCT: 1 %
Eosinophils Absolute: 0.1 10*3/uL (ref 0–0.7)
Eosinophils Relative: 5 %
HEMATOCRIT: 37.1 % — AB (ref 40.0–52.0)
HEMOGLOBIN: 12.3 g/dL — AB (ref 13.0–18.0)
LYMPHS PCT: 21 %
Lymphs Abs: 0.7 10*3/uL — ABNORMAL LOW (ref 1.0–3.6)
MCH: 32.1 pg (ref 26.0–34.0)
MCHC: 33 g/dL (ref 32.0–36.0)
MCV: 97.1 fL (ref 80.0–100.0)
MONO ABS: 0.3 10*3/uL (ref 0.2–1.0)
MONOS PCT: 11 %
NEUTROS ABS: 1.9 10*3/uL (ref 1.4–6.5)
NEUTROS PCT: 62 %
Platelets: 152 10*3/uL (ref 150–440)
RBC: 3.82 MIL/uL — ABNORMAL LOW (ref 4.40–5.90)
RDW: 15.7 % — AB (ref 11.5–14.5)
WBC: 3.1 10*3/uL — ABNORMAL LOW (ref 3.8–10.6)

## 2017-09-23 LAB — COMPREHENSIVE METABOLIC PANEL
ALBUMIN: 4 g/dL (ref 3.5–5.0)
ALT: 20 U/L (ref 17–63)
ANION GAP: 8 (ref 5–15)
AST: 26 U/L (ref 15–41)
Alkaline Phosphatase: 79 U/L (ref 38–126)
BUN: 18 mg/dL (ref 6–20)
CO2: 26 mmol/L (ref 22–32)
Calcium: 8.7 mg/dL — ABNORMAL LOW (ref 8.9–10.3)
Chloride: 105 mmol/L (ref 101–111)
Creatinine, Ser: 1.2 mg/dL (ref 0.61–1.24)
GFR calc non Af Amer: 59 mL/min — ABNORMAL LOW (ref >60)
GLUCOSE: 123 mg/dL — AB (ref 65–99)
POTASSIUM: 4 mmol/L (ref 3.5–5.1)
SODIUM: 139 mmol/L (ref 135–145)
Total Bilirubin: 0.5 mg/dL (ref 0.3–1.2)
Total Protein: 7 g/dL (ref 6.5–8.1)

## 2017-10-14 ENCOUNTER — Ambulatory Visit (INDEPENDENT_AMBULATORY_CARE_PROVIDER_SITE_OTHER): Payer: Medicare Other | Admitting: General Surgery

## 2017-10-14 ENCOUNTER — Encounter: Payer: Self-pay | Admitting: General Surgery

## 2017-10-14 VITALS — BP 124/62 | HR 70 | Ht 69.0 in | Wt 162.0 lb

## 2017-10-14 DIAGNOSIS — K4091 Unilateral inguinal hernia, without obstruction or gangrene, recurrent: Secondary | ICD-10-CM

## 2017-10-14 NOTE — Patient Instructions (Addendum)
Patient to return as needed. Proper lifting techniques reviewed.The patient is aware to call back for any questions or concerns. 

## 2017-10-14 NOTE — Progress Notes (Signed)
Patient ID: Corey Perry, male   DOB: 01-14-1946, 72 y.o.   MRN: [id number]  Chief Complaint  Patient presents with  . Follow-up    HPI Corey Perry is a 72 y.o. male here today for his one month post op right inguinal hernia repair done on 08/28/2017. Patient states he is doing well.  No difficulty with bowel or bladder function.  Past Medical History:  Diagnosis Date  . Abnormal white blood cell (WBC) count    seeing Dr. [name] at [place] on 10/25  . Atrial fibrillation ([place])   . BPH (benign prostatic hypertrophy)   . Bradycardia   . CAD (coronary artery disease)   . Dysrhythmia    bradycardia - sees Dr. Humphrey Rolls  . Esophageal cancer ([place]) 05/2016  . GERD (gastroesophageal reflux disease)   . History of kidney stones   . Hyperlipidemia   . Hypertension   . Hypothyroidism   . Myocardial infarction ([place]) 2005  . S/P angioplasty with stent 2005   x 4 vessels  . Sleep apnea    mild-NO CPAP  . Spinal stenosis    lumbar    Past Surgical History:  Procedure Laterality Date  . BACK SURGERY    . CARDIAC CATHETERIZATION Left 03/12/2016   Procedure: Left Heart Cath and Coronary Angiography;  Surgeon: [name], MD;  Location: [place] [facility];  Service: Cardiovascular;  Laterality: Left;  . CARDIAC CATHETERIZATION N/A 03/12/2016   Procedure: Coronary Stent Intervention;  Surgeon: Yolonda Kida, MD;  Location: [facility];  Service: Cardiovascular;  Laterality: N/A;  . COLONOSCOPY WITH PROPOFOL N/A 07/13/2015   Procedure: COLONOSCOPY WITH PROPOFOL;  Surgeon: Lucilla Lame, MD;  Location: [facility] [place];  Service: Endoscopy;  Laterality: N/A;  . CORONARY ANGIOPLASTY  10/05 and 12/05   4 DES placed  . ESOPHAGECTOMY  10/07/2016   @ [facility]  . ESOPHAGOGASTRODUODENOSCOPY (EGD) WITH PROPOFOL N/A 06/14/2016   Procedure: ESOPHAGOGASTRODUODENOSCOPY (EGD) WITH PROPOFOL;  Surgeon: [name], MD;  Location: [facility];  Service: Endoscopy;  Laterality:  N/A;  . HERNIA REPAIR Bilateral    x3  . INGUINAL HERNIA REPAIR Right 08/28/2017   Large PerFix plug, recurrent hernia;  Surgeon: Robert Bellow, MD;  Location: [facility];  Service: General;  Laterality: Right;  recurrent  . POLYPECTOMY  07/13/2015   Procedure: POLYPECTOMY;  Surgeon: Lucilla Lame, MD;  Location: [place];  Service: Endoscopy;;  . PORT-A-CATH REMOVAL N/A 01/16/2017   Procedure: REMOVAL PORT-A-CATH;  Surgeon: Olean Ree, MD;  Location: [facility];  Service: General;  Laterality: N/A;  . PORTACATH PLACEMENT N/A 07/08/2016   Procedure: INSERTION PORT-A-CATH;  Surgeon: Olean Ree, MD;  Location: [facility];  Service: General;  Laterality: N/A;    Family History  Problem Relation Age of Onset  . Hypertension Mother   . Dementia Mother   . Heart disease Father   . Hypertension Father   . Kidney disease Father   . Hypertension Brother   . Heart disease Brother   . Diabetes Son   . Hypertension Son   . Hyperlipidemia Son   . Hyperlipidemia Daughter   . Hypertension Daughter     Social History Social History   Tobacco Use  . Smoking status: Former Smoker    Packs/day: 1.00    Years: 40.00    Pack years: 40.00    Types: Cigarettes    Last attempt to quit: 07/16/2004    Years since quitting: 13.2  .  Smokeless tobacco: Former Systems developer    Types: Chew  Substance Use Topics  . Alcohol use: No    Alcohol/week: 0.0 oz  . Drug use: No    No Known Allergies  Current Outpatient Medications  Medication Sig Dispense Refill  . acetaminophen (TYLENOL) 500 MG tablet Take 500 mg by mouth every 6 (six) hours as needed for mild pain or moderate pain.    Marland Kitchen aspirin 81 MG tablet Take 81 mg by mouth daily. AM    . atorvastatin (LIPITOR) 40 MG tablet Take 40 mg by mouth daily.    . clopidogrel (PLAVIX) 75 MG tablet Take 1 tablet (75 mg total) by mouth daily. 90 tablet 4  . isosorbide mononitrate (IMDUR) 30 MG 24 hr tablet Take 30 mg by mouth daily. Takes at night  5  .  levothyroxine (SYNTHROID, LEVOTHROID) 50 MCG tablet Take 1 tablet (50 mcg total) daily by mouth. 90 tablet 4  . lisinopril (PRINIVIL,ZESTRIL) 10 MG tablet Take 1 tablet (10 mg total) every morning by mouth. 90 tablet 4  . metoprolol succinate (TOPROL-XL) 25 MG 24 hr tablet Take 25 mg by mouth daily.    . multivitamin-iron-minerals-folic acid (CENTRUM) chewable tablet Chew 1 tablet by mouth daily.    . nitroGLYCERIN (NITROSTAT) 0.4 MG SL tablet Place 1 tablet under the tongue every 5 (five) minutes as needed for chest pain.   98  . pantoprazole (PROTONIX) 20 MG tablet Take 1 tablet (20 mg total) daily by mouth. 90 tablet 4  . simvastatin (ZOCOR) 40 MG tablet Take 1 tablet (40 mg total) daily by mouth. 90 tablet 4   No current facility-administered medications for this visit.     Review of Systems Review of Systems  Blood pressure 124/62, pulse 70, height 5\' 9"  (1.753 m), weight 162 lb (73.5 kg), head circumference 14" (35.6 cm).  Physical Exam Physical Exam  Constitutional: He is oriented to person, place, and time. He appears well-developed and well-nourished.  Abdominal:  Right inguinal hernia repair intact and well healed.   Genitourinary:     Neurological: He is alert and oriented to person, place, and time.  Skin: Skin is warm and dry.       Assessment    Doing well post recurrent inguinal hernia with repair.    Plan    Patient to return as needed. Proper lifting techniques reviewed.The patient is aware to call back for any questions or concerns.     HPI, Physical Exam, Assessment and Plan have been scribed under the direction and in the presence of Hervey Ard, MD.  Gaspar Cola, CMA  I have completed the exam and reviewed the above documentation for accuracy and completeness.  I agree with the above.  Haematologist has been used and any errors in dictation or transcription are unintentional.  Hervey Ard, M.D., F.A.C.S.   Forest Gleason  Briahnna Harries 10/15/2017, 1:14 PM

## 2017-11-03 ENCOUNTER — Encounter: Payer: Self-pay | Admitting: Family Medicine

## 2017-11-18 DIAGNOSIS — C3490 Malignant neoplasm of unspecified part of unspecified bronchus or lung: Secondary | ICD-10-CM | POA: Diagnosis not present

## 2017-11-25 DIAGNOSIS — I340 Nonrheumatic mitral (valve) insufficiency: Secondary | ICD-10-CM | POA: Diagnosis not present

## 2017-11-25 DIAGNOSIS — K210 Gastro-esophageal reflux disease with esophagitis: Secondary | ICD-10-CM | POA: Diagnosis not present

## 2017-11-25 DIAGNOSIS — I351 Nonrheumatic aortic (valve) insufficiency: Secondary | ICD-10-CM | POA: Diagnosis not present

## 2017-11-25 DIAGNOSIS — I10 Essential (primary) hypertension: Secondary | ICD-10-CM | POA: Diagnosis not present

## 2017-11-25 DIAGNOSIS — I2510 Atherosclerotic heart disease of native coronary artery without angina pectoris: Secondary | ICD-10-CM | POA: Diagnosis not present

## 2017-12-02 DIAGNOSIS — Z87891 Personal history of nicotine dependence: Secondary | ICD-10-CM | POA: Diagnosis not present

## 2017-12-02 DIAGNOSIS — Z9889 Other specified postprocedural states: Secondary | ICD-10-CM | POA: Diagnosis not present

## 2017-12-02 DIAGNOSIS — Z8501 Personal history of malignant neoplasm of esophagus: Secondary | ICD-10-CM | POA: Diagnosis not present

## 2017-12-02 DIAGNOSIS — C159 Malignant neoplasm of esophagus, unspecified: Secondary | ICD-10-CM | POA: Diagnosis not present

## 2017-12-02 DIAGNOSIS — C155 Malignant neoplasm of lower third of esophagus: Secondary | ICD-10-CM | POA: Diagnosis not present

## 2017-12-02 DIAGNOSIS — R918 Other nonspecific abnormal finding of lung field: Secondary | ICD-10-CM | POA: Diagnosis not present

## 2017-12-02 DIAGNOSIS — Z08 Encounter for follow-up examination after completed treatment for malignant neoplasm: Secondary | ICD-10-CM | POA: Diagnosis not present

## 2018-01-02 ENCOUNTER — Other Ambulatory Visit: Payer: Self-pay

## 2018-01-02 ENCOUNTER — Telehealth: Payer: Self-pay | Admitting: Family Medicine

## 2018-01-02 DIAGNOSIS — I10 Essential (primary) hypertension: Secondary | ICD-10-CM

## 2018-01-02 MED ORDER — LISINOPRIL 10 MG PO TABS: 10.0000 mg | ORAL_TABLET | ORAL | 0 refills | Status: DC

## 2018-01-02 NOTE — Telephone Encounter (Signed)
Copied from [place] [phone]. Topic: Quick Communication - Rx Refill/Question >> Jan 02, 2018  9:01 AM Ether Griffins B wrote: Medication: lisinopril (PRINIVIL,ZESTRIL) 10 MG tablet  Has the patient contacted their pharmacy? Yes.   (Agent: If no, request that the patient contact the pharmacy for the refill.)  Preferred Pharmacy (with phone number or street name): [facility], [place] - [place]: Please be advised that RX refills may take up to 3 business days. We ask that you follow-up with your pharmacy.

## 2018-01-15 ENCOUNTER — Emergency Department
Admission: EM | Admit: 2018-01-15 | Discharge: 2018-01-15 | Disposition: A | Discharge status: Home / Self Care | Payer: Medicare Other | Attending: Emergency Medicine | Admitting: Emergency Medicine

## 2018-01-15 ENCOUNTER — Other Ambulatory Visit: Payer: Self-pay

## 2018-01-15 ENCOUNTER — Encounter: Payer: Self-pay | Admitting: Emergency Medicine

## 2018-01-15 DIAGNOSIS — I129 Hypertensive chronic kidney disease with stage 1 through stage 4 chronic kidney disease, or unspecified chronic kidney disease: Secondary | ICD-10-CM | POA: Insufficient documentation

## 2018-01-15 DIAGNOSIS — Z79899 Other long term (current) drug therapy: Secondary | ICD-10-CM | POA: Insufficient documentation

## 2018-01-15 DIAGNOSIS — Z7982 Long term (current) use of aspirin: Secondary | ICD-10-CM | POA: Diagnosis not present

## 2018-01-15 DIAGNOSIS — I259 Chronic ischemic heart disease, unspecified: Secondary | ICD-10-CM | POA: Insufficient documentation

## 2018-01-15 DIAGNOSIS — M79602 Pain in left arm: Secondary | ICD-10-CM | POA: Diagnosis not present

## 2018-01-15 DIAGNOSIS — R202 Paresthesia of skin: Secondary | ICD-10-CM | POA: Diagnosis not present

## 2018-01-15 DIAGNOSIS — Z7902 Long term (current) use of antithrombotics/antiplatelets: Secondary | ICD-10-CM | POA: Diagnosis not present

## 2018-01-15 DIAGNOSIS — E039 Hypothyroidism, unspecified: Secondary | ICD-10-CM | POA: Insufficient documentation

## 2018-01-15 DIAGNOSIS — Z87891 Personal history of nicotine dependence: Secondary | ICD-10-CM | POA: Insufficient documentation

## 2018-01-15 DIAGNOSIS — M79609 Pain in unspecified limb: Secondary | ICD-10-CM

## 2018-01-15 DIAGNOSIS — N183 Chronic kidney disease, stage 3 (moderate): Secondary | ICD-10-CM | POA: Insufficient documentation

## 2018-01-15 LAB — BASIC METABOLIC PANEL
Anion gap: 6 (ref 5–15)
BUN: 18 mg/dL (ref 6–20)
CO2: 27 mmol/L (ref 22–32)
Calcium: 8.6 mg/dL — ABNORMAL LOW (ref 8.9–10.3)
Chloride: 104 mmol/L (ref 101–111)
Creatinine, Ser: 1.3 mg/dL — ABNORMAL HIGH (ref 0.61–1.24)
GFR calc Af Amer: >60 mL/min (ref >60)
GFR calc non Af Amer: 54 mL/min — ABNORMAL LOW (ref >60)
Glucose, Bld: 116 mg/dL — ABNORMAL HIGH (ref 65–99)
Potassium: 3.9 mmol/L (ref 3.5–5.1)
Sodium: 137 mmol/L (ref 135–145)

## 2018-01-15 LAB — CBC WITH DIFFERENTIAL/PLATELET
BASOS ABS: 0 10*3/uL (ref 0–0.1)
BASOS PCT: 1 %
Eosinophils Absolute: 0.1 10*3/uL (ref 0–0.7)
Eosinophils Relative: 4 %
HEMATOCRIT: 36.3 % — AB (ref 40.0–52.0)
HEMOGLOBIN: 12.3 g/dL — AB (ref 13.0–18.0)
Lymphocytes Relative: 14 %
Lymphs Abs: 0.5 10*3/uL — ABNORMAL LOW (ref 1.0–3.6)
MCH: 33 pg (ref 26.0–34.0)
MCHC: 33.8 g/dL (ref 32.0–36.0)
MCV: 97.8 fL (ref 80.0–100.0)
MONOS PCT: 11 %
Monocytes Absolute: 0.4 10*3/uL (ref 0.2–1.0)
NEUTROS ABS: 2.5 10*3/uL (ref 1.4–6.5)
NEUTROS PCT: 70 %
Platelets: 142 10*3/uL — ABNORMAL LOW (ref 150–440)
RBC: 3.71 MIL/uL — ABNORMAL LOW (ref 4.40–5.90)
RDW: 14.3 % (ref 11.5–14.5)
WBC: 3.5 10*3/uL — ABNORMAL LOW (ref 3.8–10.6)

## 2018-01-15 LAB — TROPONIN I: Troponin I: <0.03 ng/mL (ref <0.03)

## 2018-01-15 NOTE — Discharge Instructions (Addendum)
Return to the ER for new, worsening, persistent tingling, numbness, weakness, chest pain, difficulty breathing, dizziness, or any other new or worsening symptoms that concern you.  You should follow-up with your primary care doctor.  We have also provided a referral to a neurologist who you can see if the tingling persists.

## 2018-01-15 NOTE — ED Provider Notes (Signed)
[facility] Emergency Department Provider Note ____________________________________________   First MD Initiated Contact with Patient 01/15/18 [phone]     (approximate)  I have reviewed the triage vital signs and the nursing notes.   HISTORY  Chief Complaint Arm Pain    HPI Corey Perry is a 72 y.o. male with PMH as noted below including history of CAD status post stenting in 2005 presents with left arm tingling, acute onset last night, and occurring a few times this morning, lasting for approximately 5 minutes then resolving.  Patient states it ranges from his shoulder down through his arm.  He denies associated arm pain, chest pain, difficulty breathing, or lightheadedness.  No prior history of this symptom.  No weakness in the arm although he does feel like it goes numb during the tingling.  No headache or other neurologic symptoms.  Past Medical History:  Diagnosis Date  . Abnormal white blood cell (WBC) count    seeing Dr. [name] at [place] on 10/25  . Atrial fibrillation ([place])   . BPH (benign prostatic hypertrophy)   . Bradycardia   . CAD (coronary artery disease)   . Dysrhythmia    bradycardia - sees Dr. Humphrey Rolls  . Esophageal cancer ([place]) 05/2016  . GERD (gastroesophageal reflux disease)   . History of kidney stones   . Hyperlipidemia   . Hypertension   . Hypothyroidism   . Myocardial infarction ([place]) 2005  . S/P angioplasty with stent 2005   x 4 vessels  . Sleep apnea    mild-NO CPAP  . Spinal stenosis    lumbar    Patient Active Problem List   Diagnosis Date Noted  . Advanced care planning/counseling discussion 07/29/2017  . Recurrent inguinal hernia of right side without obstruction or gangrene 07/29/2017  . Nephrolithiasis 02/02/2017  . Esophageal cancer ([place]) 06/23/2016  . Leukopenia 04/10/2016  . Thrombocytopenia ([place]) 04/10/2016  . Hypertensive kidney disease with CKD stage III ([place]) 06/22/2015  . CAD (coronary artery  disease) 06/21/2015  . BPH (benign prostatic hyperplasia) 06/21/2015  . Acid reflux 06/21/2015  . Hypothyroid   . Hyperlipidemia   . Hypertension     Past Surgical History:  Procedure Laterality Date  . BACK SURGERY    . CARDIAC CATHETERIZATION Left 03/12/2016   Procedure: Left Heart Cath and Coronary Angiography;  Surgeon: [name], MD;  Location: [place] [facility];  Service: Cardiovascular;  Laterality: Left;  . CARDIAC CATHETERIZATION N/A 03/12/2016   Procedure: Coronary Stent Intervention;  Surgeon: Yolonda Kida, MD;  Location: [place] [facility];  Service: Cardiovascular;  Laterality: N/A;  . COLONOSCOPY WITH PROPOFOL N/A 07/13/2015   Procedure: COLONOSCOPY WITH PROPOFOL;  Surgeon: Lucilla Lame, MD;  Location: [place];  Service: Endoscopy;  Laterality: N/A;  . CORONARY ANGIOPLASTY  10/05 and 12/05   4 DES placed  . ESOPHAGECTOMY  10/07/2016   @ [facility]  . ESOPHAGOGASTRODUODENOSCOPY (EGD) WITH PROPOFOL N/A 06/14/2016   Procedure: ESOPHAGOGASTRODUODENOSCOPY (EGD) WITH PROPOFOL;  Surgeon: [name], MD;  Location: [facility];  Service: Endoscopy;  Laterality: N/A;  . HERNIA REPAIR Bilateral    x3  . INGUINAL HERNIA REPAIR Right 08/28/2017   Large PerFix plug, recurrent hernia;  Surgeon: Robert Bellow, MD;  Location: [facility];  Service: General;  Laterality: Right;  recurrent  . POLYPECTOMY  07/13/2015   Procedure: POLYPECTOMY;  Surgeon: Lucilla Lame, MD;  Location: [place];  Service: Endoscopy;;  . PORT-A-CATH REMOVAL N/A  01/16/2017   Procedure: REMOVAL PORT-A-CATH;  Surgeon: Olean Ree, MD;  Location: [facility];  Service: General;  Laterality: N/A;  . PORTACATH PLACEMENT N/A 07/08/2016   Procedure: INSERTION PORT-A-CATH;  Surgeon: Olean Ree, MD;  Location: [facility];  Service: General;  Laterality: N/A;    Prior to Admission medications   Medication Sig Start Date End Date Taking? Authorizing Provider  aspirin 81 MG tablet Take  81 mg by mouth daily. AM   Yes [provider]  atorvastatin (LIPITOR) 40 MG tablet Take 40 mg by mouth daily.   Yes [provider]  clopidogrel (PLAVIX) 75 MG tablet Take 1 tablet (75 mg total) by mouth daily. 09/04/17  Yes Byrnett, Forest Gleason, MD  isosorbide mononitrate (IMDUR) 30 MG 24 hr tablet Take 30 mg by mouth daily. Takes at night 06/12/15  Yes [provider]  levothyroxine (SYNTHROID, LEVOTHROID) 50 MCG tablet Take 1 tablet (50 mcg total) daily by mouth. 07/29/17  Yes Crissman, Jeannette How, MD  lisinopril (PRINIVIL,ZESTRIL) 10 MG tablet Take 1 tablet (10 mg total) by mouth every morning. 01/02/18  Yes Crissman, Jeannette How, MD  metoprolol succinate (TOPROL-XL) 25 MG 24 hr tablet Take 25 mg by mouth daily.   Yes [provider]  multivitamin-iron-minerals-folic acid (CENTRUM) chewable tablet Chew 1 tablet by mouth daily.   Yes [provider]  pantoprazole (PROTONIX) 20 MG tablet Take 1 tablet (20 mg total) daily by mouth. 07/29/17  Yes Crissman, Jeannette How, MD  acetaminophen (TYLENOL) 500 MG tablet Take 500 mg by mouth every 6 (six) hours as needed for mild pain or moderate pain.    [provider]  nitroGLYCERIN (NITROSTAT) 0.4 MG SL tablet Place 1 tablet under the tongue every 5 (five) minutes as needed for chest pain.  03/08/16   [provider]  simvastatin (ZOCOR) 40 MG tablet Take 1 tablet (40 mg total) daily by mouth. Patient not taking: Reported on 01/15/2018 07/29/17   [name], MD    Allergies Patient has no known allergies.  Family History  Problem Relation Age of Onset  . Hypertension Mother   . Dementia Mother   . Heart disease Father   . Hypertension Father   . Kidney disease Father   . Hypertension Brother   . Heart disease Brother   . Diabetes Son   . Hypertension Son   . Hyperlipidemia Son   . Hyperlipidemia Daughter   . Hypertension Daughter     Social History Social History   Tobacco Use  . Smoking  status: Former Smoker    Packs/day: 1.00    Years: 40.00    Pack years: 40.00    Types: Cigarettes    Last attempt to quit: 07/16/2004    Years since quitting: 13.5  . Smokeless tobacco: Former Systems developer    Types: Chew  Substance Use Topics  . Alcohol use: No    Alcohol/week: 0.0 oz  . Drug use: No    Review of Systems  Constitutional: No fever. Eyes: No redness. ENT: No neck pain. Cardiovascular: Denies chest pain. Respiratory: Denies shortness of breath. Gastrointestinal: No nausea. Genitourinary: Negative for flank pain.  Musculoskeletal: Negative for back pain. Skin: Negative for rash. Neurological: Negative for headaches, or focal weakness.    ____________________________________________   PHYSICAL EXAM:  VITAL SIGNS: ED Triage Vitals  Enc Vitals Group     BP 01/15/18 0648 (!) 156/80     Pulse Rate 01/15/18 0648 (!) 59     Resp  01/15/18 0648 18     Temp 01/15/18 0648 97.7 F (36.5 C)     Temp Source 01/15/18 0648 Oral     SpO2 01/15/18 0648 98 %     Weight 01/15/18 0647 161 lb (73 kg)     Height 01/15/18 0647 5\' 9"  (1.753 m)     Head Circumference --      Peak Flow --      Pain Score 01/15/18 0647 2     Pain Loc --      Pain Edu? --      Excl. in [place]? --     Constitutional: Alert and oriented. Well appearing and in no acute distress. Eyes: Conjunctivae are normal.  EOMI.  PERRLA. Head: Atraumatic. Nose: No congestion/rhinnorhea. Mouth/Throat: Mucous membranes are moist.   Neck: Normal range of motion.  Nontender. Cardiovascular: Normal rate, regular rhythm. Grossly normal heart sounds.  Good peripheral circulation. Respiratory: Normal respiratory effort.  No retractions. Lungs CTAB. Gastrointestinal:  No distention.  Genitourinary: No CVA tenderness. Musculoskeletal: No lower extremity edema.  Extremities warm and well perfused.  Neurologic:  Normal speech and language.  5/5 motor strength and intact sensation to all extremities.  Normal grip  bilaterally.  No gross focal neurologic deficits are appreciated.  Skin:  Skin is warm and dry. No rash noted. Psychiatric: Mood and affect are normal. Speech and behavior are normal.  ____________________________________________   LABS (all labs ordered are listed, but only abnormal results are displayed)  Labs Reviewed  BASIC METABOLIC PANEL - Abnormal; Notable for the following components:      Result Value   Glucose, Bld 116 (*)    Creatinine, Ser 1.30 (*)    Calcium 8.6 (*)    GFR calc non Af Amer 54 (*)    All other components within normal limits  CBC WITH DIFFERENTIAL/PLATELET - Abnormal; Notable for the following components:   WBC 3.5 (*)    RBC 3.71 (*)    Hemoglobin 12.3 (*)    HCT 36.3 (*)    Platelets 142 (*)    Lymphs Abs 0.5 (*)    All other components within normal limits  TROPONIN I   ____________________________________________  EKG  ED ECG REPORT I, Arta Silence, the attending physician, personally viewed and interpreted this ECG.  Date: 01/15/2018 EKG Time: 653 Rate: 55 Rhythm: normal sinus rhythm QRS Axis: normal Intervals: normal ST/T Wave abnormalities: T wave inversions inferiorly and in V5 V6 Narrative Interpretation: Nonspecific new T wave inversions when compared to EKG of 01/16/2017  ____________________________________________  RADIOLOGY    ____________________________________________   PROCEDURES  Procedure(s) performed: No  Procedures  Critical Care performed: No ____________________________________________   INITIAL IMPRESSION / ASSESSMENT AND PLAN / ED COURSE  Pertinent labs & imaging results that were available during my care of the patient were reviewed by me and considered in my medical decision making (see chart for details).  72 year old male with PMH as noted below presents with left arm tingling acute onset last night and recurring early this morning, lasting for a few minutes at a time and then resolving.   He reports some subjective numbness during these episodes as well, but no weakness.  No chest pain or other cardiopulmonary symptoms, and no other neurologic symptoms.  I reviewed the past medical records in Epic; patient has had no recent admissions or recent cardiac interventions.  On exam, the patient is well-appearing, vital signs are normal except for slight hypertension, and the remainder the exam is  unremarkable.  Neuro exam is nonfocal at this time.  Differential includes radiculopathy, peripheral neuropathy, muscle strain/spasm, or less likely cardiac etiology.  Given patient's increased risk with his history of CAD, I will obtain basic labs and troponin x1.  If it is negative, anticipate discharge home.    ----------------------------------------- 9:06 AM on 01/15/2018 -----------------------------------------  Troponin is negative and other labs are within normal limits for patient.  He has remained asymptomatic in the ED.  He is safe for discharge home.  Return precautions given, and he expresses understanding.  Neurology referral provided as well.  ____________________________________________   FINAL CLINICAL IMPRESSION(S) / ED DIAGNOSES  Final diagnoses:  Paresthesia and pain of left extremity      NEW MEDICATIONS STARTED DURING THIS VISIT:  New Prescriptions   No medications on file     Note:  This document was prepared using Dragon voice recognition software and may include unintentional dictation errors.   Arta Silence, MD 01/15/18 [id number]

## 2018-01-15 NOTE — ED Triage Notes (Signed)
Patient ambulatory to triage with steady gait, without difficulty or distress noted; pt reports since yesterday having left upper arm "soreness" and numbness radiating down; denies any accomp symptoms or CP; hx card stent placement

## 2018-01-15 NOTE — ED Notes (Signed)
Pt ambulatory to POV without difficulty with wife. VSS. NAD. Discharge instructions, RX and follow up discussed. All questions addressed.

## 2018-01-26 ENCOUNTER — Ambulatory Visit: Payer: Medicare Other | Admitting: Family Medicine

## 2018-02-10 ENCOUNTER — Ambulatory Visit: Payer: Medicare Other | Admitting: Family Medicine

## 2018-02-25 ENCOUNTER — Encounter: Payer: Self-pay | Admitting: Family Medicine

## 2018-02-25 ENCOUNTER — Ambulatory Visit (INDEPENDENT_AMBULATORY_CARE_PROVIDER_SITE_OTHER): Payer: Medicare Other | Admitting: Family Medicine

## 2018-02-25 VITALS — BP 117/62 | HR 52 | Temp 97.7°F | Ht 68.5 in | Wt 159.2 lb

## 2018-02-25 DIAGNOSIS — I10 Essential (primary) hypertension: Secondary | ICD-10-CM | POA: Diagnosis not present

## 2018-02-25 DIAGNOSIS — D696 Thrombocytopenia, unspecified: Secondary | ICD-10-CM | POA: Diagnosis not present

## 2018-02-25 DIAGNOSIS — I2510 Atherosclerotic heart disease of native coronary artery without angina pectoris: Secondary | ICD-10-CM | POA: Diagnosis not present

## 2018-02-25 DIAGNOSIS — I2583 Coronary atherosclerosis due to lipid rich plaque: Secondary | ICD-10-CM

## 2018-02-25 DIAGNOSIS — E785 Hyperlipidemia, unspecified: Secondary | ICD-10-CM | POA: Diagnosis not present

## 2018-02-25 DIAGNOSIS — I129 Hypertensive chronic kidney disease with stage 1 through stage 4 chronic kidney disease, or unspecified chronic kidney disease: Secondary | ICD-10-CM | POA: Diagnosis not present

## 2018-02-25 LAB — LP+ALT+AST PICCOLO, WAIVED
ALT (SGPT) Piccolo, Waived: 32 U/L (ref 10–47)
AST (SGOT) PICCOLO, WAIVED: 31 U/L (ref 11–38)
CHOLESTEROL PICCOLO, WAIVED: 95 mg/dL (ref <200)
Chol/HDL Ratio Piccolo,Waive: 2.5 mg/dL
HDL Chol Piccolo, Waived: 38 mg/dL — ABNORMAL LOW (ref >59)
LDL Chol Calc Piccolo Waived: 35 mg/dL (ref <100)
Triglycerides Piccolo,Waived: 108 mg/dL (ref <150)
VLDL Chol Calc Piccolo,Waive: 22 mg/dL (ref <30)

## 2018-02-25 MED ORDER — LISINOPRIL 10 MG PO TABS: 10.0000 mg | ORAL_TABLET | ORAL | 2 refills | Status: DC

## 2018-02-25 MED ORDER — ATORVASTATIN CALCIUM 40 MG PO TABS: 40.0000 mg | ORAL_TABLET | Freq: Every day | ORAL | 2 refills | Status: DC

## 2018-02-25 NOTE — Assessment & Plan Note (Signed)
The current medical regimen is effective;  continue present plan and medications.  

## 2018-02-25 NOTE — Progress Notes (Signed)
BP 117/62 (BP Location: Left Arm, Patient Position: Sitting, Cuff Size: Normal)   Pulse (!) 52   Temp 97.7 F (36.5 C) (Oral)   Ht 5' 8.5" (1.74 m)   Wt 159 lb 3.2 oz (72.2 kg)   SpO2 97%   BMI 23.85 kg/m    Subjective:    Patient ID: Corey Perry, male    DOB: 05/24/1946, 72 y.o.   MRN: [id number]  HPI: [name] is a 72 y.o. male  Chief Complaint  Patient presents with  . Hypertension  . Hyperlipidemia   Patient follow-up all in all doing well no complaints good control of blood pressure. Taking medications faithfully without problems or issues. No chest pain chest tightness or cardiovascular issues.  Relevant past medical, surgical, family and social history reviewed and updated as indicated. Interim medical history since our last visit reviewed. Allergies and medications reviewed and updated.  Review of Systems  Constitutional: Negative.   Respiratory: Negative.   Cardiovascular: Negative.     Per HPI unless specifically indicated above     Objective:    BP 117/62 (BP Location: Left Arm, Patient Position: Sitting, Cuff Size: Normal)   Pulse (!) 52   Temp 97.7 F (36.5 C) (Oral)   Ht 5' 8.5" (1.74 m)   Wt 159 lb 3.2 oz (72.2 kg)   SpO2 97%   BMI 23.85 kg/m   Wt Readings from Last 3 Encounters:  02/25/18 159 lb 3.2 oz (72.2 kg)  01/15/18 161 lb (73 kg)  10/14/17 162 lb (73.5 kg)    Physical Exam  Constitutional: He is oriented to person, place, and time. He appears well-developed and well-nourished.  HENT:  Head: Normocephalic and atraumatic.  Eyes: Conjunctivae and EOM are normal.  Neck: Normal range of motion.  Cardiovascular: Normal rate, regular rhythm and normal heart sounds.  Pulmonary/Chest: Effort normal and breath sounds normal.  Musculoskeletal: Normal range of motion.  Neurological: He is alert and oriented to person, place, and time.  Skin: No erythema.  Psychiatric: He has a normal mood and affect. His behavior is normal.  Judgment and thought content normal.    Results for orders placed or performed during the hospital encounter of 48/54/62  Basic metabolic panel  Result Value Ref Range   Sodium 137 135 - 145 mmol/L   Potassium 3.9 3.5 - 5.1 mmol/L   Chloride 104 101 - 111 mmol/L   CO2 27 22 - 32 mmol/L   Glucose, Bld 116 (H) 65 - 99 mg/dL   BUN 18 6 - 20 mg/dL   Creatinine, Ser 1.30 (H) 0.61 - 1.24 mg/dL   Calcium 8.6 (L) 8.9 - 10.3 mg/dL   GFR calc non Af Amer 54 (L) >60 mL/min   GFR calc Af Amer >60 >60 mL/min   Anion gap 6 5 - 15  CBC with Differential  Result Value Ref Range   WBC 3.5 (L) 3.8 - 10.6 K/uL   RBC 3.71 (L) 4.40 - 5.90 MIL/uL   Hemoglobin 12.3 (L) 13.0 - 18.0 g/dL   HCT 36.3 (L) 40.0 - 52.0 %   MCV 97.8 80.0 - 100.0 fL   MCH 33.0 26.0 - 34.0 pg   MCHC 33.8 32.0 - 36.0 g/dL   RDW 14.3 11.5 - 14.5 %   Platelets 142 (L) 150 - 440 K/uL   Neutrophils Relative % 70 %   Neutro Abs 2.5 1.4 - 6.5 K/uL   Lymphocytes Relative 14 %   Lymphs Abs  0.5 (L) 1.0 - 3.6 K/uL   Monocytes Relative 11 %   Monocytes Absolute 0.4 0.2 - 1.0 K/uL   Eosinophils Relative 4 %   Eosinophils Absolute 0.1 0 - 0.7 K/uL   Basophils Relative 1 %   Basophils Absolute 0.0 0 - 0.1 K/uL  Troponin I  Result Value Ref Range   Troponin I <0.03 <0.03 ng/mL      Assessment & Plan:   Problem List Items Addressed This Visit      Cardiovascular and Mediastinum   Hypertension - Primary    The current medical regimen is effective;  continue present plan and medications.       Relevant Orders   Basic metabolic panel   CAD (coronary artery disease)    The current medical regimen is effective;  continue present plan and medications.         Other   Hyperlipidemia    The current medical regimen is effective;  continue present plan and medications.       Thrombocytopenia ([place])   Relevant Orders   Basic metabolic panel   LP+ALT+AST [place], Waived       Follow up plan: Return in about 6 months  (around 08/27/2018) for Physical Exam.

## 2018-02-26 ENCOUNTER — Encounter: Payer: Self-pay | Admitting: Family Medicine

## 2018-02-26 LAB — BASIC METABOLIC PANEL
BUN/Creatinine Ratio: 12 (ref 10–24)
BUN: 16 mg/dL (ref 8–27)
CALCIUM: 8.4 mg/dL — AB (ref 8.6–10.2)
CO2: 28 mmol/L (ref 20–29)
CREATININE: 1.29 mg/dL — AB (ref 0.76–1.27)
Chloride: 99 mmol/L (ref 96–106)
GFR, EST AFRICAN AMERICAN: 64 mL/min/{1.73_m2} (ref >59)
GFR, EST NON AFRICAN AMERICAN: 55 mL/min/{1.73_m2} — AB (ref >59)
Glucose: 93 mg/dL (ref 65–99)
Potassium: 4 mmol/L (ref 3.5–5.2)
Sodium: 138 mmol/L (ref 134–144)

## 2018-03-22 NOTE — Progress Notes (Signed)
[place]  [phone] [phone]) [phone]  ID: Corey Perry OB: 17-Jun-1946  MR#: [id number]  VVO#:[id number]  Patient Care Team: [name], MD as PCP - General (Family Medicine) [name], MD as PCP - Family Medicine (Family Medicine) [name], MD as Consulting Physician (Oncology) [name], MD as Referring Physician  CHIEF COMPLAINT: Stage IIb adenocarcinoma of the lower third of the esophagus.  INTERVAL HISTORY: Patient returns to clinic today for repeat laboratory work and further evaluation.  He had CT scan and evaluation by thoracic surgery at [facility] in March 2019 that revealed no evidence of disease.  He currently feels well and is asymptomatic. He does not complain of dysphasia or difficulty swallowing today.  He has a good appetite and denies weight loss.  He denies any recent fevers or illnesses. He has no neurologic complaints. He denies any chest pain or shortness of breath. He denies any nausea, vomiting, constipation, or diarrhea.  Patient offers no specific complaints today.    REVIEW OF SYSTEMS:   Review of Systems  Constitutional: Negative.  Negative for fever, malaise/fatigue and weight loss.  HENT: Negative.   Respiratory: Negative.  Negative for cough and shortness of breath.   Cardiovascular: Negative.  Negative for chest pain and leg swelling.  Gastrointestinal: Negative.  Negative for abdominal pain, blood in stool, constipation, diarrhea, melena, nausea and vomiting.  Genitourinary: Negative.  Negative for frequency and urgency.       Nocturia  Musculoskeletal: Negative.  Negative for back pain.  Skin: Negative.  Negative for rash.  Neurological: Negative.  Negative for tingling, focal weakness, weakness and headaches.  Psychiatric/Behavioral: Negative.  The patient is not nervous/anxious and does not have insomnia.     As per HPI. Otherwise, a complete review of systems is  negative.  PAST MEDICAL HISTORY: Past Medical History:  Diagnosis Date  . Abnormal white blood cell (WBC) count    seeing Dr. [name] at [place] on 10/25  . Atrial fibrillation ([place])   . BPH (benign prostatic hypertrophy)   . Bradycardia   . CAD (coronary artery disease)   . Dysrhythmia    bradycardia - sees Dr. Humphrey Rolls  . Esophageal cancer ([place]) 05/2016  . GERD (gastroesophageal reflux disease)   . History of kidney stones   . Hyperlipidemia   . Hypertension   . Hypothyroidism   . Myocardial infarction ([place]) 2005  . S/P angioplasty with stent 2005   x 4 vessels  . Sleep apnea    mild-NO CPAP  . Spinal stenosis    lumbar    PAST SURGICAL HISTORY: Past Surgical History:  Procedure Laterality Date  . BACK SURGERY    . CARDIAC CATHETERIZATION Left 03/12/2016   Procedure: Left Heart Cath and Coronary Angiography;  Surgeon: [name], MD;  Location: [place] [facility];  Service: Cardiovascular;  Laterality: Left;  . CARDIAC CATHETERIZATION N/A 03/12/2016   Procedure: Coronary Stent Intervention;  Surgeon: Yolonda Kida, MD;  Location: [place] [facility];  Service: Cardiovascular;  Laterality: N/A;  . COLONOSCOPY WITH PROPOFOL N/A 07/13/2015   Procedure: COLONOSCOPY WITH PROPOFOL;  Surgeon: Lucilla Lame, MD;  Location: [place];  Service: Endoscopy;  Laterality: N/A;  . CORONARY ANGIOPLASTY  10/05 and 12/05   4 DES placed  . ESOPHAGECTOMY  10/07/2016   @ [facility]  . ESOPHAGOGASTRODUODENOSCOPY (EGD) WITH PROPOFOL N/A 06/14/2016   Procedure: ESOPHAGOGASTRODUODENOSCOPY (EGD) WITH PROPOFOL;  Surgeon: [name], MD;  Location: [facility];  Service: Endoscopy;  Laterality: N/A;  . HERNIA REPAIR Bilateral    x3  . INGUINAL HERNIA REPAIR Right 08/28/2017   Large PerFix plug, recurrent hernia;  Surgeon: Robert Bellow, MD;  Location: [facility];  Service: General;  Laterality: Right;  recurrent  . POLYPECTOMY  07/13/2015   Procedure: POLYPECTOMY;   Surgeon: Lucilla Lame, MD;  Location: [place];  Service: Endoscopy;;  . PORT-A-CATH REMOVAL N/A 01/16/2017   Procedure: REMOVAL PORT-A-CATH;  Surgeon: Olean Ree, MD;  Location: [facility];  Service: General;  Laterality: N/A;  . PORTACATH PLACEMENT N/A 07/08/2016   Procedure: INSERTION PORT-A-CATH;  Surgeon: Olean Ree, MD;  Location: [facility];  Service: General;  Laterality: N/A;    FAMILY HISTORY Family History  Problem Relation Age of Onset  . Hypertension Mother   . Dementia Mother   . Heart disease Father   . Hypertension Father   . Kidney disease Father   . Hypertension Brother   . Heart disease Brother   . Diabetes Son   . Hypertension Son   . Hyperlipidemia Son   . Hyperlipidemia Daughter   . Hypertension Daughter        ADVANCED DIRECTIVES:    HEALTH MAINTENANCE: Social History   Tobacco Use  . Smoking status: Former Smoker    Packs/day: 1.00    Years: 40.00    Pack years: 40.00    Types: Cigarettes    Last attempt to quit: 07/16/2004    Years since quitting: 13.7  . Smokeless tobacco: Former Systems developer    Types: Chew  Substance Use Topics  . Alcohol use: No    Alcohol/week: 0.0 oz  . Drug use: No     Colonoscopy:  PAP:  Bone density:  Lipid panel:  No Known Allergies  Current Outpatient Medications  Medication Sig Dispense Refill  . aspirin 81 MG tablet Take 81 mg by mouth daily. AM    . atorvastatin (LIPITOR) 40 MG tablet Take 1 tablet (40 mg total) by mouth daily. 90 tablet 2  . clopidogrel (PLAVIX) 75 MG tablet Take 1 tablet (75 mg total) by mouth daily. 90 tablet 4  . isosorbide mononitrate (IMDUR) 30 MG 24 hr tablet Take 30 mg by mouth daily. Takes at night  5  . levothyroxine (SYNTHROID, LEVOTHROID) 50 MCG tablet Take 1 tablet (50 mcg total) daily by mouth. 90 tablet 4  . lisinopril (PRINIVIL,ZESTRIL) 10 MG tablet Take 1 tablet (10 mg total) by mouth every morning. 90 tablet 2  . metoprolol succinate (TOPROL-XL) 25 MG 24 hr tablet  Take 25 mg by mouth daily.    . multivitamin-iron-minerals-folic acid (CENTRUM) chewable tablet Chew 1 tablet by mouth daily.    . pantoprazole (PROTONIX) 20 MG tablet Take 1 tablet (20 mg total) daily by mouth. 90 tablet 4  . acetaminophen (TYLENOL) 500 MG tablet Take 500 mg by mouth every 6 (six) hours as needed for mild pain or moderate pain.    . nitroGLYCERIN (NITROSTAT) 0.4 MG SL tablet Place 1 tablet under the tongue every 5 (five) minutes as needed for chest pain.   98   No current facility-administered medications for this visit.     OBJECTIVE: Vitals:   03/24/18 1435  BP: (!) 108/59  Pulse: (!) 59  Resp: 18  Temp: (!) 94.3 F (34.6 C)     Body mass index is 23.63 kg/m.    ECOG FS:0 - Asymptomatic  General: Well-developed, well-nourished, no acute distress. Eyes:  Pink conjunctiva, anicteric sclera. HEENT: Normocephalic, moist mucous membranes, clear oropharnyx. Lungs: Clear to auscultation bilaterally. Heart: Regular rate and rhythm. No rubs, murmurs, or gallops. Abdomen: Soft, nontender, nondistended. No organomegaly noted, normoactive bowel sounds. Musculoskeletal: No edema, cyanosis, or clubbing. Neuro: Alert, answering all questions appropriately. Cranial nerves grossly intact. Skin: No rashes or petechiae noted. Psych: Normal affect.  LAB RESULTS:  Lab Results  Component Value Date   NA 136 03/24/2018   K 4.3 03/24/2018   CL 106 03/24/2018   CO2 23 03/24/2018   GLUCOSE 96 03/24/2018   BUN 20 03/24/2018   CREATININE 1.30 (H) 03/24/2018   CALCIUM 9.0 03/24/2018   PROT 7.0 03/24/2018   ALBUMIN 4.2 03/24/2018   AST 25 03/24/2018   ALT 25 03/24/2018   ALKPHOS 68 03/24/2018   BILITOT 0.3 03/24/2018   GFRNONAA 54 (L) 03/24/2018   GFRAA >60 03/24/2018    Lab Results  Component Value Date   WBC 3.9 03/24/2018   NEUTROABS 2.7 03/24/2018   HGB 12.1 (L) 03/24/2018   HCT 35.7 (L) 03/24/2018   MCV 98.8 03/24/2018   PLT 115 (L) 03/24/2018      STUDIES: No results found.  ASSESSMENT: Pathologic Stage IIb (T3, N0, M0) adenocarcinoma of the lower third of the esophagus.  PLAN:    1. Stage IIb adenocarcinoma of the lower third of the esophagus: Patient had EUS completed at [facility] confirming the stage and diagnosis. He completed 6 cycles of weekly carboplatinum and Taxol completed on August 21, 2016 and daily XRT completed on August 27, 2016. Patient had his esophagectomy on October 07, 2016.  Patient's most recent CT scan at [facility] on December 02, 2017 revealed no evidence of progressive or recurrent disease.  Continue follow-up at [facility] every 6 months.  Will alternate evaluation with them every 6 months as well.  Return to clinic in 6 months for further evaluation.  2. Thrombocytopenia: Mild, platelets 115.  Previously entire workup did not reveal a distinct etiology. No bone marrow biopsy has been performed to this point. Continue to monitor closely.  4. Renal insufficiency: Patient's creatinine is mildly elevated at 1.3 which is approximately his baseline.  Monitor. 5. Nocturia: Patient does not complain of this today.  Consider urology referral if symptoms continue to be disruptive. 6.  Leukopenia: Resolved.  Patient expressed understanding and was in agreement with this plan. He also understands that He can call clinic at any time with any questions, concerns, or complaints.    [name], MD 03/26/18 9:32 AM

## 2018-03-23 ENCOUNTER — Other Ambulatory Visit: Payer: Self-pay | Admitting: *Deleted

## 2018-03-23 DIAGNOSIS — C159 Malignant neoplasm of esophagus, unspecified: Secondary | ICD-10-CM

## 2018-03-24 ENCOUNTER — Inpatient Hospital Stay (HOSPITAL_BASED_OUTPATIENT_CLINIC_OR_DEPARTMENT_OTHER): Payer: Medicare Other | Admitting: Oncology

## 2018-03-24 ENCOUNTER — Other Ambulatory Visit: Payer: Self-pay

## 2018-03-24 ENCOUNTER — Inpatient Hospital Stay: Payer: Medicare Other | Attending: Oncology

## 2018-03-24 VITALS — BP 108/59 | HR 59 | Temp 94.3°F | Resp 18 | Wt 157.7 lb

## 2018-03-24 DIAGNOSIS — C155 Malignant neoplasm of lower third of esophagus: Secondary | ICD-10-CM

## 2018-03-24 DIAGNOSIS — D696 Thrombocytopenia, unspecified: Secondary | ICD-10-CM

## 2018-03-24 DIAGNOSIS — Z7982 Long term (current) use of aspirin: Secondary | ICD-10-CM | POA: Diagnosis not present

## 2018-03-24 DIAGNOSIS — N289 Disorder of kidney and ureter, unspecified: Secondary | ICD-10-CM | POA: Diagnosis not present

## 2018-03-24 DIAGNOSIS — Z79899 Other long term (current) drug therapy: Secondary | ICD-10-CM | POA: Diagnosis not present

## 2018-03-24 DIAGNOSIS — Z87891 Personal history of nicotine dependence: Secondary | ICD-10-CM | POA: Diagnosis not present

## 2018-03-24 DIAGNOSIS — Z8501 Personal history of malignant neoplasm of esophagus: Secondary | ICD-10-CM | POA: Diagnosis not present

## 2018-03-24 DIAGNOSIS — C159 Malignant neoplasm of esophagus, unspecified: Secondary | ICD-10-CM

## 2018-03-24 LAB — CBC WITH DIFFERENTIAL/PLATELET
BASOS PCT: 0 %
Basophils Absolute: 0 10*3/uL (ref 0–0.1)
EOS ABS: 0.1 10*3/uL (ref 0–0.7)
Eosinophils Relative: 3 %
HCT: 35.7 % — ABNORMAL LOW (ref 40.0–52.0)
Hemoglobin: 12.1 g/dL — ABNORMAL LOW (ref 13.0–18.0)
LYMPHS ABS: 0.6 10*3/uL — AB (ref 1.0–3.6)
Lymphocytes Relative: 15 %
MCH: 33.6 pg (ref 26.0–34.0)
MCHC: 34 g/dL (ref 32.0–36.0)
MCV: 98.8 fL (ref 80.0–100.0)
MONO ABS: 0.4 10*3/uL (ref 0.2–1.0)
MONOS PCT: 11 %
NEUTROS PCT: 71 %
Neutro Abs: 2.7 10*3/uL (ref 1.4–6.5)
PLATELETS: 115 10*3/uL — AB (ref 150–440)
RBC: 3.61 MIL/uL — ABNORMAL LOW (ref 4.40–5.90)
RDW: 14.9 % — AB (ref 11.5–14.5)
WBC: 3.9 10*3/uL (ref 3.8–10.6)

## 2018-03-24 LAB — COMPREHENSIVE METABOLIC PANEL
ALBUMIN: 4.2 g/dL (ref 3.5–5.0)
ALK PHOS: 68 U/L (ref 38–126)
ALT: 25 U/L (ref 0–44)
AST: 25 U/L (ref 15–41)
Anion gap: 7 (ref 5–15)
BUN: 20 mg/dL (ref 8–23)
CALCIUM: 9 mg/dL (ref 8.9–10.3)
CHLORIDE: 106 mmol/L (ref 98–111)
CO2: 23 mmol/L (ref 22–32)
CREATININE: 1.3 mg/dL — AB (ref 0.61–1.24)
GFR calc Af Amer: >60 mL/min (ref >60)
GFR calc non Af Amer: 54 mL/min — ABNORMAL LOW (ref >60)
GLUCOSE: 96 mg/dL (ref 70–99)
Potassium: 4.3 mmol/L (ref 3.5–5.1)
SODIUM: 136 mmol/L (ref 135–145)
Total Bilirubin: 0.3 mg/dL (ref 0.3–1.2)
Total Protein: 7 g/dL (ref 6.5–8.1)

## 2018-03-24 NOTE — Progress Notes (Signed)
Here for follow up. Per pt "doing good "

## 2018-03-31 DIAGNOSIS — I10 Essential (primary) hypertension: Secondary | ICD-10-CM | POA: Diagnosis not present

## 2018-03-31 DIAGNOSIS — E785 Hyperlipidemia, unspecified: Secondary | ICD-10-CM | POA: Diagnosis not present

## 2018-03-31 DIAGNOSIS — R0602 Shortness of breath: Secondary | ICD-10-CM | POA: Diagnosis not present

## 2018-03-31 DIAGNOSIS — I2510 Atherosclerotic heart disease of native coronary artery without angina pectoris: Secondary | ICD-10-CM | POA: Diagnosis not present

## 2018-07-03 ENCOUNTER — Ambulatory Visit (INDEPENDENT_AMBULATORY_CARE_PROVIDER_SITE_OTHER): Payer: Medicare Other

## 2018-07-03 VITALS — BP 120/60 | HR 60 | Temp 97.8°F | Resp 16 | Ht 69.0 in | Wt 153.7 lb

## 2018-07-03 DIAGNOSIS — Z0000 Encounter for general adult medical examination without abnormal findings: Secondary | ICD-10-CM | POA: Diagnosis not present

## 2018-07-03 DIAGNOSIS — Z1159 Encounter for screening for other viral diseases: Secondary | ICD-10-CM

## 2018-07-03 DIAGNOSIS — Z23 Encounter for immunization: Secondary | ICD-10-CM | POA: Diagnosis not present

## 2018-07-03 NOTE — Patient Instructions (Signed)
[name] , Thank you for taking time to come for your Medicare Wellness Visit. I appreciate your ongoing commitment to your health goals. Please review the following plan we discussed and let me know if I can assist you in the future.   Screening recommendations/referrals: Colonoscopy: completed 07/13/2015 Recommended yearly ophthalmology/optometry visit for glaucoma screening and checkup Recommended yearly dental visit for hygiene and checkup  Vaccinations: Influenza vaccine: done today  Pneumococcal vaccine: completed series Tetanus/Tdap/Td vaccine: completed 05/26/2013 Shingles vaccine: shingrix eligible, check with your insurance company for coverage    Advanced directives: Please bring a copy of your health care power of attorney and living will to the office at your convenience.  Conditions/risks identified: Recommend drinking at least 6-8 glasses of water a day   Next appointment: Follow up on 08/27/2018 at 1:00pm with Dr.Crissman. Follow up in one year for your annual wellness exam.   Preventive Care 72 Years and Older, Male Preventive care refers to lifestyle choices and visits with your health care provider that can promote health and wellness. What does preventive care include?  A yearly physical exam. This is also called an annual well check.  Dental exams once or twice a year.  Routine eye exams. Ask your health care provider how often you should have your eyes checked.  Personal lifestyle choices, including:  Daily care of your teeth and gums.  Regular physical activity.  Eating a healthy diet.  Avoiding tobacco and drug use.  Limiting alcohol use.  Practicing safe sex.  Taking low doses of aspirin every day.  Taking vitamin and mineral supplements as recommended by your health care provider. What happens during an annual well check? The services and screenings done by your health care provider during your annual well check will depend on your age,  overall health, lifestyle risk factors, and family history of disease. Counseling  Your health care provider may ask you questions about your:  Alcohol use.  Tobacco use.  Drug use.  Emotional well-being.  Home and relationship well-being.  Sexual activity.  Eating habits.  History of falls.  Memory and ability to understand (cognition).  Work and work Statistician. Screening  You may have the following tests or measurements:  Height, weight, and BMI.  Blood pressure.  Lipid and cholesterol levels. These may be checked every 5 years, or more frequently if you are over 15 years old.  Skin check.  Lung cancer screening. You may have this screening every year starting at age 72 if you have a 30-pack-year history of smoking and currently smoke or have quit within the past 15 years.  Fecal occult blood test (FOBT) of the stool. You may have this test every year starting at age 72.  Flexible sigmoidoscopy or colonoscopy. You may have a sigmoidoscopy every 5 years or a colonoscopy every 10 years starting at age 72.  Prostate cancer screening. Recommendations will vary depending on your family history and other risks.  Hepatitis C blood test.  Hepatitis B blood test.  Sexually transmitted disease (STD) testing.  Diabetes screening. This is done by checking your blood sugar (glucose) after you have not eaten for a while (fasting). You may have this done every 1-3 years.  Abdominal aortic aneurysm (AAA) screening. You may need this if you are a current or former smoker.  Osteoporosis. You may be screened starting at age 72 if you are at high risk. Talk with your health care provider about your test results, treatment options, and if necessary, the  need for more tests. Vaccines  Your health care provider may recommend certain vaccines, such as:  Influenza vaccine. This is recommended every year.  Tetanus, diphtheria, and acellular pertussis (Tdap, Td) vaccine. You may  need a Td booster every 10 years.  Zoster vaccine. You may need this after age 64.  Pneumococcal 13-valent conjugate (PCV13) vaccine. One dose is recommended after age 22.  Pneumococcal polysaccharide (PPSV23) vaccine. One dose is recommended after age 24. Talk to your health care provider about which screenings and vaccines you need and how often you need them. This information is not intended to replace advice given to you by your health care provider. Make sure you discuss any questions you have with your health care provider. Document Released: 09/29/2015 Document Revised: 05/22/2016 Document Reviewed: 07/04/2015 Elsevier Interactive Patient Education  2017 [place] Prevention in the Home Falls can cause injuries. They can happen to people of all ages. There are many things you can do to make your home safe and to help prevent falls. What can I do on the outside of my home?  Regularly fix the edges of walkways and driveways and fix any cracks.  Remove anything that might make you trip as you walk through a door, such as a raised step or threshold.  Trim any bushes or trees on the path to your home.  Use bright outdoor lighting.  Clear any walking paths of anything that might make someone trip, such as rocks or tools.  Regularly check to see if handrails are loose or broken. Make sure that both sides of any steps have handrails.  Any raised decks and porches should have guardrails on the edges.  Have any leaves, snow, or ice cleared regularly.  Use sand or salt on walking paths during winter.  Clean up any spills in your garage right away. This includes oil or grease spills. What can I do in the bathroom?  Use night lights.  Install grab bars by the toilet and in the tub and shower. Do not use towel bars as grab bars.  Use non-skid mats or decals in the tub or shower.  If you need to sit down in the shower, use a plastic, non-slip stool.  Keep the floor  dry. Clean up any water that spills on the floor as soon as it happens.  Remove soap buildup in the tub or shower regularly.  Attach bath mats securely with double-sided non-slip rug tape.  Do not have throw rugs and other things on the floor that can make you trip. What can I do in the bedroom?  Use night lights.  Make sure that you have a light by your bed that is easy to reach.  Do not use any sheets or blankets that are too big for your bed. They should not hang down onto the floor.  Have a firm chair that has side arms. You can use this for support while you get dressed.  Do not have throw rugs and other things on the floor that can make you trip. What can I do in the kitchen?  Clean up any spills right away.  Avoid walking on wet floors.  Keep items that you use a lot in easy-to-reach places.  If you need to reach something above you, use a strong step stool that has a grab bar.  Keep electrical cords out of the way.  Do not use floor polish or wax that makes floors slippery. If you must use wax,  use non-skid floor wax.  Do not have throw rugs and other things on the floor that can make you trip. What can I do with my stairs?  Do not leave any items on the stairs.  Make sure that there are handrails on both sides of the stairs and use them. Fix handrails that are broken or loose. Make sure that handrails are as long as the stairways.  Check any carpeting to make sure that it is firmly attached to the stairs. Fix any carpet that is loose or worn.  Avoid having throw rugs at the top or bottom of the stairs. If you do have throw rugs, attach them to the floor with carpet tape.  Make sure that you have a light switch at the top of the stairs and the bottom of the stairs. If you do not have them, ask someone to add them for you. What else can I do to help prevent falls?  Wear shoes that:  Do not have high heels.  Have rubber bottoms.  Are comfortable and fit you  well.  Are closed at the toe. Do not wear sandals.  If you use a stepladder:  Make sure that it is fully opened. Do not climb a closed stepladder.  Make sure that both sides of the stepladder are locked into place.  Ask someone to hold it for you, if possible.  Clearly mark and make sure that you can see:  Any grab bars or handrails.  First and last steps.  Where the edge of each step is.  Use tools that help you move around (mobility aids) if they are needed. These include:  Canes.  Walkers.  Scooters.  Crutches.  Turn on the lights when you go into a dark area. Replace any light bulbs as soon as they burn out.  Set up your furniture so you have a clear path. Avoid moving your furniture around.  If any of your floors are uneven, fix them.  If there are any pets around you, be aware of where they are.  Review your medicines with your doctor. Some medicines can make you feel dizzy. This can increase your chance of falling. Ask your doctor what other things that you can do to help prevent falls. This information is not intended to replace advice given to you by your health care provider. Make sure you discuss any questions you have with your health care provider. Document Released: 06/29/2009 Document Revised: 02/08/2016 Document Reviewed: 10/07/2014 Elsevier Interactive Patient Education  2017 [name].

## 2018-07-03 NOTE — Progress Notes (Signed)
Subjective:   Corey Perry is a 72 y.o. male who presents for Medicare Annual/Subsequent preventive examination.  Review of Systems:  n/a Cardiac Risk Factors include: advanced age (>53men, >68 women);dyslipidemia;hypertension;smoking/ tobacco exposure     Objective:    Vitals: BP 120/60 (BP Location: Left Arm, Patient Position: Sitting, Cuff Size: Normal)   Pulse 60   Temp 97.8 F (36.6 C) (Temporal)   Resp 16   Ht 5\' 9"  (1.753 m)   Wt 153 lb 11.2 oz (69.7 kg)   BMI 22.70 kg/m   Body mass index is 22.7 kg/m.  Advanced Directives 07/03/2018 03/24/2018 01/15/2018 09/23/2017 09/08/2017 08/28/2017 08/18/2017  Does Patient Have a Medical Advance Directive? Yes Yes No Yes Yes Yes Yes  Type of Paramedic of [place];Living will [place];Living will - - [place];Living will [place];Living will [place];Living will  Does patient want to make changes to medical advance directive? - - - - - - No - Patient declined  Copy of [place] in Chart? No - copy requested No - copy requested - - - No - copy requested No - copy requested  Would patient like information on creating a medical advance directive? - - No - Patient declined - - - -    Tobacco Social History   Tobacco Use  Smoking Status Former Smoker  . Packs/day: 1.00  . Years: 40.00  . Pack years: 40.00  . Types: Cigarettes  . Last attempt to quit: 07/16/2004  . Years since quitting: 13.9  Smokeless Tobacco Former Systems developer  . Types: Chew     Counseling given: Not Answered   Clinical Intake:  Pre-visit preparation completed: Yes  Pain : No/denies pain     Nutritional Status: BMI of 19-24  Normal Nutritional Risks: None Diabetes: No  How often do you need to have someone help you when you read instructions, pamphlets, or other written materials from your doctor or pharmacy?: 1 - Never What is the  last grade level you completed in school?: 9th grade  Interpreter Needed?: No  Information entered by :: Marie Borowski,LPN   Past Medical History:  Diagnosis Date  . Abnormal white blood cell (WBC) count    seeing Dr. [name] at [facility] on 10/25  . Atrial fibrillation ([place])   . BPH (benign prostatic hypertrophy)   . Bradycardia   . CAD (coronary artery disease)   . Dysrhythmia    bradycardia - sees Dr. Humphrey Rolls  . Esophageal cancer ([place]) 05/2016  . GERD (gastroesophageal reflux disease)   . History of kidney stones   . Hyperlipidemia   . Hypertension   . Hypothyroidism   . Myocardial infarction ([place]) 2005  . S/P angioplasty with stent 2005   x 4 vessels  . Sleep apnea    mild-NO CPAP  . Spinal stenosis    lumbar   Past Surgical History:  Procedure Laterality Date  . BACK SURGERY    . CARDIAC CATHETERIZATION Left 03/12/2016   Procedure: Left Heart Cath and Coronary Angiography;  Surgeon: [name], MD;  Location: [facility];  Service: Cardiovascular;  Laterality: Left;  . CARDIAC CATHETERIZATION N/A 03/12/2016   Procedure: Coronary Stent Intervention;  Surgeon: Yolonda Kida, MD;  Location: [facility];  Service: Cardiovascular;  Laterality: N/A;  . COLONOSCOPY WITH PROPOFOL N/A 07/13/2015   Procedure: COLONOSCOPY WITH PROPOFOL;  Surgeon: Lucilla Lame, MD;  Location:  [facility];  Service: Endoscopy;  Laterality: N/A;  . CORONARY ANGIOPLASTY  10/05 and 12/05   4 DES placed  . ESOPHAGECTOMY  10/07/2016   @ [facility]  . ESOPHAGOGASTRODUODENOSCOPY (EGD) WITH PROPOFOL N/A 06/14/2016   Procedure: ESOPHAGOGASTRODUODENOSCOPY (EGD) WITH PROPOFOL;  Surgeon: [name], MD;  Location: [facility];  Service: Endoscopy;  Laterality: N/A;  . HERNIA REPAIR Bilateral    x3  . INGUINAL HERNIA REPAIR Right 08/28/2017   Large PerFix plug, recurrent hernia;  Surgeon: Robert Bellow, MD;  Location: [facility];  Service: General;  Laterality: Right;   recurrent  . POLYPECTOMY  07/13/2015   Procedure: POLYPECTOMY;  Surgeon: Lucilla Lame, MD;  Location: [facility];  Service: Endoscopy;;  . PORT-A-CATH REMOVAL N/A 01/16/2017   Procedure: REMOVAL PORT-A-CATH;  Surgeon: Olean Ree, MD;  Location: [facility];  Service: General;  Laterality: N/A;  . PORTACATH PLACEMENT N/A 07/08/2016   Procedure: INSERTION PORT-A-CATH;  Surgeon: Olean Ree, MD;  Location: [facility];  Service: General;  Laterality: N/A;   Family History  Problem Relation Age of Onset  . Hypertension Mother   . Dementia Mother   . Heart disease Father   . Hypertension Father   . Kidney disease Father   . Hypertension Brother   . Heart disease Brother   . Diabetes Son   . Hypertension Son   . Hyperlipidemia Son   . Hyperlipidemia Daughter   . Hypertension Daughter    Social History   Socioeconomic History  . Marital status: Married    Spouse name: Not on file  . Number of children: Not on file  . Years of education: Not on file  . Highest education level: 9th grade  Occupational History  . Not on file  Social Needs  . Financial resource strain: Not hard at all  . Food insecurity:    Worry: Never true    Inability: Never true  . Transportation needs:    Medical: No    Non-medical: No  Tobacco Use  . Smoking status: Former Smoker    Packs/day: 1.00    Years: 40.00    Pack years: 40.00    Types: Cigarettes    Last attempt to quit: 07/16/2004    Years since quitting: 13.9  . Smokeless tobacco: Former Systems developer    Types: Chew  Substance and Sexual Activity  . Alcohol use: No    Alcohol/week: 0.0 standard drinks  . Drug use: No  . Sexual activity: Not on file  Lifestyle  . Physical activity:    Days per week: 0 days    Minutes per session: 0 min  . Stress: Not at all  Relationships  . Social connections:    Talks on phone: More than three times a week    Gets together: More than three times a week    Attends religious service: More than 4 times  per year    Active member of club or organization: No    Attends meetings of clubs or organizations: Never    Relationship status: Married  Other Topics Concern  . Not on file  Social History Narrative  . Not on file    Outpatient Encounter Medications as of 07/03/2018  Medication Sig  . acetaminophen (TYLENOL) 500 MG tablet Take 500 mg by mouth every 6 (six) hours as needed for mild pain or moderate pain.  Marland Kitchen aspirin 81 MG tablet Take 81 mg by mouth daily. AM  . atorvastatin (LIPITOR) 40 MG tablet  Take 1 tablet (40 mg total) by mouth daily.  . clopidogrel (PLAVIX) 75 MG tablet Take 1 tablet (75 mg total) by mouth daily.  . isosorbide mononitrate (IMDUR) 30 MG 24 hr tablet Take 30 mg by mouth daily. Takes at night  . levothyroxine (SYNTHROID, LEVOTHROID) 50 MCG tablet Take 1 tablet (50 mcg total) daily by mouth.  Marland Kitchen lisinopril (PRINIVIL,ZESTRIL) 10 MG tablet Take 1 tablet (10 mg total) by mouth every morning.  . metoprolol succinate (TOPROL-XL) 25 MG 24 hr tablet Take 25 mg by mouth daily.  . multivitamin-iron-minerals-folic acid (CENTRUM) chewable tablet Chew 1 tablet by mouth daily.  . pantoprazole (PROTONIX) 20 MG tablet Take 1 tablet (20 mg total) daily by mouth.  . nitroGLYCERIN (NITROSTAT) 0.4 MG SL tablet Place 1 tablet under the tongue every 5 (five) minutes as needed for chest pain.    No facility-administered encounter medications on file as of 07/03/2018.     Activities of Daily Living In your present state of health, do you have any difficulty performing the following activities: 07/03/2018 08/18/2017  Hearing? Y N  Comment declines hearing aids  -  Vision? N N  Difficulty concentrating or making decisions? N N  Walking or climbing stairs? N N  Dressing or bathing? N N  Doing errands, shopping? N N  Preparing Food and eating ? N -  Using the Toilet? N -  In the past six months, have you accidently leaked urine? N -  Do you have problems with loss of bowel control? N -    Managing your Medications? N -  Managing your Finances? N -  Housekeeping or managing your Housekeeping? N -  Some recent data might be hidden    Patient Care Team: [name], MD as PCP - General (Family Medicine) [name], MD as PCP - Family Medicine (Family Medicine) [name], MD as Consulting Physician (Oncology) [name], MD as Referring Physician [name], MD as Consulting Physician (Cardiology)   Assessment:   This is a routine wellness examination for [place].  Exercise Activities and Dietary recommendations Current Exercise Habits: The patient does not participate in regular exercise at present, Exercise limited by: None identified  Goals    . DIET - INCREASE WATER INTAKE     Recommend drinking at least 6-8 glasses of water a day        Fall Risk Fall Risk  07/03/2018 07/29/2017 07/02/2017 01/22/2017 06/24/2016  Falls in the past year? No No No No No   FALL RISK PREVENTION PERTAINING TO THE HOME:  Any stairs in or around the home WITH handrails? Yes  Home free of loose throw rugs in walkways, pet beds, electrical cords, etc? Yes  Adequate lighting in your home to reduce risk of falls? Yes   ASSISTIVE DEVICES UTILIZED TO PREVENT FALLS:  Life alert? No  Use of a cane, walker or w/c? No  Grab bars in the bathroom? No  Shower chair or bench in shower? No  Elevated toilet seat or a handicapped toilet? No   DME ORDERS:  DME order needed?  No   TIMED UP AND GO:  Was the test performed? Yes .  Length of time to ambulate 10 feet: 8 sec.   GAIT:  Appearance of gait: Gait stead-fast without the use of an assistive device.  Education: Fall risk prevention has been discussed.  Intervention(s) required? No    Depression Screen PHQ 2/9 Scores 07/03/2018 07/02/2017 06/24/2016 06/21/2015  PHQ -  2 Score 0 0 0 0    Cognitive Function     6CIT Screen 07/03/2018 07/02/2017  What Year? 0 points 0 points  What month? 0 points 0  points  What time? 0 points 0 points  Count back from 20 0 points 0 points  Months in reverse 0 points 0 points  Repeat phrase 0 points 2 points  Total Score 0 2    Immunization History  Administered Date(s) Administered  . Influenza, High Dose Seasonal PF 06/24/2016, 07/02/2017, 07/03/2018  . Influenza,inj,Quad PF,6+ Mos 06/21/2015  . Influenza-Unspecified 10/18/2016  . Pneumococcal Conjugate-13 06/14/2014  . Pneumococcal-Unspecified 04/09/2005, 09/28/2009  . Tdap 05/26/2013  . Zoster 06/08/2008    Qualifies for Shingles Vaccine? Yes  Zostavax completed 06/08/2008. Due for Shingrix. Education has been provided regarding the importance of this vaccine. Pt has been advised to call insurance company to determine out of pocket expense. Advised may also receive vaccine at local pharmacy or Health Dept. Verbalized acceptance and understanding.  Tdap:completed 05/26/2013  Flu Vaccine: Due for Flu vaccine. Does the patient want to receive this vaccine today?  Yes .  Pneumococcal Vaccine: completed series   Screening Tests Health Maintenance  Topic Date Due  . Hepatitis C Screening  Aug 07, 1946  . INFLUENZA VACCINE  04/16/2018  . COLONOSCOPY  07/12/2020  . TETANUS/TDAP  05/27/2023   Cancer Screenings:  Colorectal Screening: Completed 07/13/2015. Repeat every 5 years  Lung Cancer Screening: (Low Dose CT Chest recommended if Age 28-80 years, 30 pack-year currently smoking OR have quit w/in 15years.) does qualify.  Completed with oncologist     Additional Screening:  Hepatitis C Screening: does qualify; order for future labs   Vision Screening: Recommended annual ophthalmology exams for early detection of glaucoma and other disorders of the eye. Is the patient up to date with their annual eye exam?  Yes  Who is the provider or what is the name of the office in which the pt attends annual eye exams? [facility]    Dental Screening: Recommended annual dental exams for proper oral  hygiene  Community Resource Referral:  CRR required this visit?  No       Plan:    I have personally reviewed and addressed the Medicare Annual Wellness questionnaire and have noted the following in the patient's chart:  A. Medical and social history B. Use of alcohol, tobacco or illicit drugs  C. Current medications and supplements D. Functional ability and status E.  Nutritional status F.  Physical activity G. Advance directives H. List of other physicians I.  Hospitalizations, surgeries, and ER visits in previous 12 months J.  [place] such as hearing and vision if needed, cognitive and depression L. Referrals and appointments   In addition, I have reviewed and discussed with patient certain preventive protocols, quality metrics, and best practice recommendations. A written personalized care plan for preventive services as well as general preventive health recommendations were provided to patient.   Signed,  Tyler Aas, LPN Nurse Health Advisor   Nurse Notes:none

## 2018-07-09 DIAGNOSIS — C155 Malignant neoplasm of lower third of esophagus: Secondary | ICD-10-CM | POA: Diagnosis not present

## 2018-07-09 DIAGNOSIS — R918 Other nonspecific abnormal finding of lung field: Secondary | ICD-10-CM | POA: Diagnosis not present

## 2018-07-09 DIAGNOSIS — Z87891 Personal history of nicotine dependence: Secondary | ICD-10-CM | POA: Diagnosis not present

## 2018-07-16 DIAGNOSIS — R918 Other nonspecific abnormal finding of lung field: Secondary | ICD-10-CM | POA: Diagnosis not present

## 2018-07-16 DIAGNOSIS — Z7902 Long term (current) use of antithrombotics/antiplatelets: Secondary | ICD-10-CM | POA: Diagnosis not present

## 2018-07-16 DIAGNOSIS — Z8501 Personal history of malignant neoplasm of esophagus: Secondary | ICD-10-CM | POA: Diagnosis not present

## 2018-07-16 DIAGNOSIS — R935 Abnormal findings on diagnostic imaging of other abdominal regions, including retroperitoneum: Secondary | ICD-10-CM | POA: Diagnosis not present

## 2018-07-16 DIAGNOSIS — Z08 Encounter for follow-up examination after completed treatment for malignant neoplasm: Secondary | ICD-10-CM | POA: Diagnosis not present

## 2018-07-16 DIAGNOSIS — Z87891 Personal history of nicotine dependence: Secondary | ICD-10-CM | POA: Diagnosis not present

## 2018-07-16 DIAGNOSIS — C155 Malignant neoplasm of lower third of esophagus: Secondary | ICD-10-CM | POA: Diagnosis not present

## 2018-08-03 DIAGNOSIS — N183 Chronic kidney disease, stage 3 (moderate): Secondary | ICD-10-CM | POA: Diagnosis not present

## 2018-08-03 DIAGNOSIS — Z9221 Personal history of antineoplastic chemotherapy: Secondary | ICD-10-CM | POA: Diagnosis not present

## 2018-08-03 DIAGNOSIS — C155 Malignant neoplasm of lower third of esophagus: Secondary | ICD-10-CM | POA: Diagnosis not present

## 2018-08-03 DIAGNOSIS — N400 Enlarged prostate without lower urinary tract symptoms: Secondary | ICD-10-CM | POA: Diagnosis not present

## 2018-08-03 DIAGNOSIS — K311 Adult hypertrophic pyloric stenosis: Secondary | ICD-10-CM | POA: Diagnosis not present

## 2018-08-03 DIAGNOSIS — K208 Other esophagitis: Secondary | ICD-10-CM | POA: Diagnosis not present

## 2018-08-03 DIAGNOSIS — Z79899 Other long term (current) drug therapy: Secondary | ICD-10-CM | POA: Diagnosis not present

## 2018-08-03 DIAGNOSIS — Z87891 Personal history of nicotine dependence: Secondary | ICD-10-CM | POA: Diagnosis not present

## 2018-08-03 DIAGNOSIS — I2510 Atherosclerotic heart disease of native coronary artery without angina pectoris: Secondary | ICD-10-CM | POA: Diagnosis not present

## 2018-08-03 DIAGNOSIS — Z9049 Acquired absence of other specified parts of digestive tract: Secondary | ICD-10-CM | POA: Diagnosis not present

## 2018-08-03 DIAGNOSIS — I129 Hypertensive chronic kidney disease with stage 1 through stage 4 chronic kidney disease, or unspecified chronic kidney disease: Secondary | ICD-10-CM | POA: Diagnosis not present

## 2018-08-03 DIAGNOSIS — Z7982 Long term (current) use of aspirin: Secondary | ICD-10-CM | POA: Diagnosis not present

## 2018-08-03 DIAGNOSIS — K219 Gastro-esophageal reflux disease without esophagitis: Secondary | ICD-10-CM | POA: Diagnosis not present

## 2018-08-03 DIAGNOSIS — Z923 Personal history of irradiation: Secondary | ICD-10-CM | POA: Diagnosis not present

## 2018-08-03 DIAGNOSIS — Z8501 Personal history of malignant neoplasm of esophagus: Secondary | ICD-10-CM | POA: Diagnosis not present

## 2018-08-04 ENCOUNTER — Other Ambulatory Visit: Payer: Self-pay

## 2018-08-10 ENCOUNTER — Other Ambulatory Visit: Payer: Self-pay

## 2018-08-10 DIAGNOSIS — K219 Gastro-esophageal reflux disease without esophagitis: Secondary | ICD-10-CM

## 2018-08-10 MED ORDER — PANTOPRAZOLE SODIUM 20 MG PO TBEC: 20.0000 mg | DELAYED_RELEASE_TABLET | Freq: Every day | ORAL | 4 refills | Status: DC

## 2018-08-27 ENCOUNTER — Encounter: Payer: Self-pay | Admitting: Family Medicine

## 2018-08-27 ENCOUNTER — Ambulatory Visit (INDEPENDENT_AMBULATORY_CARE_PROVIDER_SITE_OTHER): Payer: Medicare Other | Admitting: Family Medicine

## 2018-08-27 VITALS — BP 120/72 | HR 65 | Temp 98.4°F | Ht 69.0 in | Wt 155.0 lb

## 2018-08-27 DIAGNOSIS — N183 Chronic kidney disease, stage 3 unspecified: Secondary | ICD-10-CM

## 2018-08-27 DIAGNOSIS — Z7189 Other specified counseling: Secondary | ICD-10-CM | POA: Diagnosis not present

## 2018-08-27 DIAGNOSIS — N200 Calculus of kidney: Secondary | ICD-10-CM | POA: Diagnosis not present

## 2018-08-27 DIAGNOSIS — E039 Hypothyroidism, unspecified: Secondary | ICD-10-CM | POA: Diagnosis not present

## 2018-08-27 DIAGNOSIS — Z1159 Encounter for screening for other viral diseases: Secondary | ICD-10-CM | POA: Diagnosis not present

## 2018-08-27 DIAGNOSIS — I10 Essential (primary) hypertension: Secondary | ICD-10-CM | POA: Diagnosis not present

## 2018-08-27 DIAGNOSIS — I2510 Atherosclerotic heart disease of native coronary artery without angina pectoris: Secondary | ICD-10-CM

## 2018-08-27 DIAGNOSIS — E785 Hyperlipidemia, unspecified: Secondary | ICD-10-CM | POA: Diagnosis not present

## 2018-08-27 DIAGNOSIS — I2583 Coronary atherosclerosis due to lipid rich plaque: Secondary | ICD-10-CM

## 2018-08-27 DIAGNOSIS — I129 Hypertensive chronic kidney disease with stage 1 through stage 4 chronic kidney disease, or unspecified chronic kidney disease: Secondary | ICD-10-CM | POA: Diagnosis not present

## 2018-08-27 DIAGNOSIS — C155 Malignant neoplasm of lower third of esophagus: Secondary | ICD-10-CM

## 2018-08-27 DIAGNOSIS — D692 Other nonthrombocytopenic purpura: Secondary | ICD-10-CM | POA: Insufficient documentation

## 2018-08-27 DIAGNOSIS — N400 Enlarged prostate without lower urinary tract symptoms: Secondary | ICD-10-CM | POA: Diagnosis not present

## 2018-08-27 LAB — URINALYSIS, ROUTINE W REFLEX MICROSCOPIC
BILIRUBIN UA: NEGATIVE
Glucose, UA: NEGATIVE
KETONES UA: NEGATIVE
LEUKOCYTES UA: NEGATIVE
Nitrite, UA: NEGATIVE
PH UA: 5 (ref 5.0–7.5)
Protein, UA: NEGATIVE
RBC, UA: NEGATIVE
Specific Gravity, UA: 1.025 (ref 1.005–1.030)
Urobilinogen, Ur: 0.2 mg/dL (ref 0.2–1.0)

## 2018-08-27 MED ORDER — LISINOPRIL 10 MG PO TABS: 10.0000 mg | ORAL_TABLET | ORAL | 4 refills | Status: DC

## 2018-08-27 MED ORDER — CLOPIDOGREL BISULFATE 75 MG PO TABS: 75.0000 mg | ORAL_TABLET | Freq: Every day | ORAL | 4 refills | Status: DC

## 2018-08-27 MED ORDER — LEVOTHYROXINE SODIUM 50 MCG PO TABS: 50.0000 ug | ORAL_TABLET | Freq: Every day | ORAL | 4 refills | Status: DC

## 2018-08-27 MED ORDER — ATORVASTATIN CALCIUM 40 MG PO TABS: 40.0000 mg | ORAL_TABLET | Freq: Every day | ORAL | 4 refills | Status: DC

## 2018-08-27 NOTE — Assessment & Plan Note (Signed)
The current medical regimen is effective;  continue present plan and medications.  

## 2018-08-27 NOTE — Assessment & Plan Note (Signed)
Stable

## 2018-08-27 NOTE — Assessment & Plan Note (Signed)
Continues with good reports reviewed last month's notes and stable

## 2018-08-27 NOTE — Assessment & Plan Note (Signed)
A voluntary discussion about advanced care planning including explanation and discussion of advanced directives was extentively discussed with the patient.  Explained about the healthcare proxy and living will was reviewed and packet with forms with expiration of how to fill them out was given.  Time spent: Encounter 16+ min individuals present: Patient 

## 2018-08-27 NOTE — Progress Notes (Signed)
BP 120/72   Pulse 65   Temp 98.4 F (36.9 C) (Oral)   Ht 5\' 9"  (1.753 m)   Wt 155 lb (70.3 kg)   SpO2 99%   BMI 22.89 kg/m    Subjective:    Patient ID: Corey Perry, male    DOB: [birth year], 72 y.o.   MRN: [id number]  HPI: Corey Perry is a 72 y.o. male  Recheck medical problems:  This is done annually  Patient follow-up esophageal cancer which is been stable followed by [facility] just had procedure done last month and is been getting good reports. No blood pressure issues no problems with medications. No bleeding bruising issues with Plavix and aspirin. No problems with atorvastatin for cholesterol. Reflux GI symptoms all stable.  Relevant past medical, surgical, family and social history reviewed and updated as indicated. Interim medical history since our last visit reviewed. Allergies and medications reviewed and updated.  Review of Systems  Constitutional: Negative.   HENT: Negative.   Eyes: Negative.   Respiratory: Negative.   Cardiovascular: Negative.   Gastrointestinal: Negative.   Endocrine: Negative.   Genitourinary: Negative.   Musculoskeletal: Negative.   Skin: Negative.   Allergic/Immunologic: Negative.   Neurological: Negative.   Hematological: Negative.   Psychiatric/Behavioral: Negative.     Per HPI unless specifically indicated above     Objective:    BP 120/72   Pulse 65   Temp 98.4 F (36.9 C) (Oral)   Ht 5\' 9"  (1.753 m)   Wt 155 lb (70.3 kg)   SpO2 99%   BMI 22.89 kg/m   Wt Readings from Last 3 Encounters:  08/27/18 155 lb (70.3 kg)  07/03/18 153 lb 11.2 oz (69.7 kg)  03/24/18 157 lb 11.2 oz (71.5 kg)    Physical Exam Constitutional:      Appearance: He is well-developed.  HENT:     Head: Normocephalic and atraumatic.     Right Ear: External ear normal.     Left Ear: External ear normal.  Eyes:     Conjunctiva/sclera: Conjunctivae normal.     Pupils: Pupils are equal, round, and reactive to light.  Neck:   Musculoskeletal: Normal range of motion and neck supple.  Cardiovascular:     Rate and Rhythm: Normal rate and regular rhythm.     Heart sounds: Normal heart sounds.  Pulmonary:     Effort: Pulmonary effort is normal.     Breath sounds: Normal breath sounds.  Abdominal:     General: Bowel sounds are normal.     Palpations: Abdomen is soft. There is no hepatomegaly or splenomegaly.  Genitourinary:    Penis: Normal.      Prostate: Normal.     Rectum: Normal.  Musculoskeletal: Normal range of motion.  Skin:    Findings: No erythema or rash.  Neurological:     Mental Status: He is alert and oriented to person, place, and time.     Deep Tendon Reflexes: Reflexes are normal and symmetric.  Psychiatric:        Behavior: Behavior normal.        Thought Content: Thought content normal.        Judgment: Judgment normal.     Results for orders placed or performed in visit on 03/24/18  Comprehensive metabolic panel  Result Value Ref Range   Sodium 136 135 - 145 mmol/L   Potassium 4.3 3.5 - 5.1 mmol/L   Chloride 106 98 - 111 mmol/L   CO2 23  22 - 32 mmol/L   Glucose, Bld 96 70 - 99 mg/dL   BUN 20 8 - 23 mg/dL   Creatinine, Ser 1.30 (H) 0.61 - 1.24 mg/dL   Calcium 9.0 8.9 - 10.3 mg/dL   Total Protein 7.0 6.5 - 8.1 g/dL   Albumin 4.2 3.5 - 5.0 g/dL   AST 25 15 - 41 U/L   ALT 25 0 - 44 U/L   Alkaline Phosphatase 68 38 - 126 U/L   Total Bilirubin 0.3 0.3 - 1.2 mg/dL   GFR calc non Af Amer 54 (L) >60 mL/min   GFR calc Af Amer >60 >60 mL/min   Anion gap 7 5 - 15  CBC with Differential/Platelet  Result Value Ref Range   WBC 3.9 3.8 - 10.6 K/uL   RBC 3.61 (L) 4.40 - 5.90 MIL/uL   Hemoglobin 12.1 (L) 13.0 - 18.0 g/dL   HCT 35.7 (L) 40.0 - 52.0 %   MCV 98.8 80.0 - 100.0 fL   MCH 33.6 26.0 - 34.0 pg   MCHC 34.0 32.0 - 36.0 g/dL   RDW 14.9 (H) 11.5 - 14.5 %   Platelets 115 (L) 150 - 440 K/uL   Neutrophils Relative % 71 %   Neutro Abs 2.7 1.4 - 6.5 K/uL   Lymphocytes Relative 15 %    Lymphs Abs 0.6 (L) 1.0 - 3.6 K/uL   Monocytes Relative 11 %   Monocytes Absolute 0.4 0.2 - 1.0 K/uL   Eosinophils Relative 3 %   Eosinophils Absolute 0.1 0 - 0.7 K/uL   Basophils Relative 0 %   Basophils Absolute 0.0 0 - 0.1 K/uL      Assessment & Plan:   Problem List Items Addressed This Visit      Cardiovascular and Mediastinum   Hypertension - Primary    The current medical regimen is effective;  continue present plan and medications.       Relevant Medications   lisinopril (PRINIVIL,ZESTRIL) 10 MG tablet   atorvastatin (LIPITOR) 40 MG tablet   Other Relevant Orders   CBC with Differential/Platelet   Comprehensive metabolic panel   Lipid panel   Urinalysis, Routine w reflex microscopic   Senile purpura (HCC)    Stable       Relevant Medications   lisinopril (PRINIVIL,ZESTRIL) 10 MG tablet   atorvastatin (LIPITOR) 40 MG tablet     Digestive   Esophageal cancer ([place])    Continues with good reports reviewed last month's notes and stable      Relevant Medications   clopidogrel (PLAVIX) 75 MG tablet     Endocrine   Hypothyroid   Relevant Medications   levothyroxine (SYNTHROID, LEVOTHROID) 50 MCG tablet   Other Relevant Orders   TSH     Genitourinary   BPH (benign prostatic hyperplasia)    Stable       Relevant Orders   PSA   Hypertensive kidney disease with CKD stage III (HCC)   Relevant Orders   CBC with Differential/Platelet   Comprehensive metabolic panel   Lipid panel   Urinalysis, Routine w reflex microscopic   Nephrolithiasis   Relevant Orders   CBC with Differential/Platelet   Comprehensive metabolic panel   Lipid panel   Urinalysis, Routine w reflex microscopic     Other   Hyperlipidemia    The current medical regimen is effective;  continue present plan and medications.       Relevant Medications   lisinopril (PRINIVIL,ZESTRIL) 10 MG tablet   atorvastatin (  LIPITOR) 40 MG tablet   levothyroxine (SYNTHROID, LEVOTHROID) 50 MCG  tablet   Other Relevant Orders   CBC with Differential/Platelet   Comprehensive metabolic panel   Lipid panel   Urinalysis, Routine w reflex microscopic   Advanced care planning/counseling discussion    A voluntary discussion about advanced care planning including explanation and discussion of advanced directives was extentively discussed with the patient.  Explained about the healthcare proxy and living will was reviewed and packet with forms with expiration of how to fill them out was given.  Time spent: Encounter 16+ min individuals present: Patient       Other Visit Diagnoses    Encounter for hepatitis C screening test for low risk patient           Follow up plan: Return in about 6 months (around 02/26/2019) for BMP,  Lipids, ALT, AST.

## 2018-08-28 LAB — LIPID PANEL
CHOLESTEROL TOTAL: 99 mg/dL — AB (ref 100–199)
Chol/HDL Ratio: 2.2 ratio (ref 0.0–5.0)
HDL: 46 mg/dL (ref >39)
LDL Calculated: 36 mg/dL (ref 0–99)
Triglycerides: 83 mg/dL (ref 0–149)
VLDL CHOLESTEROL CAL: 17 mg/dL (ref 5–40)

## 2018-08-28 LAB — CBC WITH DIFFERENTIAL/PLATELET
BASOS ABS: 0 10*3/uL (ref 0.0–0.2)
Basos: 1 %
EOS (ABSOLUTE): 0.1 10*3/uL (ref 0.0–0.4)
Eos: 3 %
Hematocrit: 36.5 % — ABNORMAL LOW (ref 37.5–51.0)
Hemoglobin: 12.4 g/dL — ABNORMAL LOW (ref 13.0–17.7)
IMMATURE GRANS (ABS): 0 10*3/uL (ref 0.0–0.1)
Immature Granulocytes: 1 %
LYMPHS ABS: 1 10*3/uL (ref 0.7–3.1)
Lymphs: 30 %
MCH: 33.6 pg — AB (ref 26.6–33.0)
MCHC: 34 g/dL (ref 31.5–35.7)
MCV: 99 fL — ABNORMAL HIGH (ref 79–97)
MONOCYTES: 8 %
Monocytes Absolute: 0.2 10*3/uL (ref 0.1–0.9)
Neutrophils Absolute: 1.8 10*3/uL (ref 1.4–7.0)
Neutrophils: 57 %
PLATELETS: 115 10*3/uL — AB (ref 150–450)
RBC: 3.69 x10E6/uL — ABNORMAL LOW (ref 4.14–5.80)
RDW: 13.1 % (ref 12.3–15.4)
WBC: 3.1 10*3/uL — AB (ref 3.4–10.8)

## 2018-08-28 LAB — COMPREHENSIVE METABOLIC PANEL
A/G RATIO: 1.8 (ref 1.2–2.2)
ALT: 20 IU/L (ref 0–44)
AST: 20 IU/L (ref 0–40)
Albumin: 4.2 g/dL (ref 3.5–4.8)
Alkaline Phosphatase: 68 IU/L (ref 39–117)
BILIRUBIN TOTAL: 0.3 mg/dL (ref 0.0–1.2)
BUN/Creatinine Ratio: 10 (ref 10–24)
BUN: 15 mg/dL (ref 8–27)
CHLORIDE: 103 mmol/L (ref 96–106)
CO2: 24 mmol/L (ref 20–29)
Calcium: 8.5 mg/dL — ABNORMAL LOW (ref 8.6–10.2)
Creatinine, Ser: 1.53 mg/dL — ABNORMAL HIGH (ref 0.76–1.27)
GFR calc non Af Amer: 45 mL/min/{1.73_m2} — ABNORMAL LOW (ref >59)
GFR, EST AFRICAN AMERICAN: 52 mL/min/{1.73_m2} — AB (ref >59)
GLUCOSE: 121 mg/dL — AB (ref 65–99)
Globulin, Total: 2.3 g/dL (ref 1.5–4.5)
POTASSIUM: 3.6 mmol/L (ref 3.5–5.2)
Sodium: 141 mmol/L (ref 134–144)
TOTAL PROTEIN: 6.5 g/dL (ref 6.0–8.5)

## 2018-08-28 LAB — PSA: PROSTATE SPECIFIC AG, SERUM: 0.4 ng/mL (ref 0.0–4.0)

## 2018-08-28 LAB — HEPATITIS C ANTIBODY: Hep C Virus Ab: <0.1 s/co ratio (ref 0.0–0.9)

## 2018-08-28 LAB — TSH: TSH: 1.64 u[IU]/mL (ref 0.450–4.500)

## 2018-08-30 ENCOUNTER — Encounter: Payer: Self-pay | Admitting: Family Medicine

## 2018-08-31 ENCOUNTER — Other Ambulatory Visit: Payer: Self-pay | Admitting: Family Medicine

## 2018-08-31 DIAGNOSIS — C155 Malignant neoplasm of lower third of esophagus: Secondary | ICD-10-CM

## 2018-08-31 MED ORDER — CLOPIDOGREL BISULFATE 75 MG PO TABS: 75.0000 mg | ORAL_TABLET | Freq: Every day | ORAL | 1 refills | Status: DC

## 2018-08-31 NOTE — Telephone Encounter (Signed)
Copied from [place] [id number]. Topic: Quick Communication - Rx Refill/Question >> Aug 31, 2018  9:07 AM Bea Graff, NT wrote: Medication: clopidogrel (PLAVIX) 75 MG tablet   Has the patient contacted their pharmacy? Yes.   (Agent: If no, request that the patient contact the pharmacy for the refill.) (Agent: If yes, when and what did the pharmacy advise?)Did not receive refill on 12/12  Preferred Pharmacy (with phone number or street name): [facility], [place] - [address]  [address] Phone: [phone] Fax: [phone]    Agent: Please be advised that RX refills may take up to 3 business days. We ask that you follow-up with your pharmacy.

## 2018-08-31 NOTE — Telephone Encounter (Signed)
Copied from [place] [id number]. Topic: Quick Communication - Rx Refill/Question >> Aug 31, 2018  9:07 AM Bea Graff, NT wrote: Medication: clopidogrel (PLAVIX) 75 MG tablet   Has the patient contacted their pharmacy? Yes.   (Agent: If no, request that the patient contact the pharmacy for the refill.) (Agent: If yes, when and what did the pharmacy advise?)Did not receive refill on 12/12  Preferred Pharmacy (with phone number or street name): [place], [place] - [address] [place] [address] Phone: [phone] Fax: [phone]    Agent: Please be advised that RX refills may take up to 3 business days. We ask that you follow-up with your pharmacy.

## 2018-09-01 DIAGNOSIS — I10 Essential (primary) hypertension: Secondary | ICD-10-CM | POA: Diagnosis not present

## 2018-09-01 DIAGNOSIS — I2510 Atherosclerotic heart disease of native coronary artery without angina pectoris: Secondary | ICD-10-CM | POA: Diagnosis not present

## 2018-09-01 DIAGNOSIS — E785 Hyperlipidemia, unspecified: Secondary | ICD-10-CM | POA: Diagnosis not present

## 2018-09-01 DIAGNOSIS — R0602 Shortness of breath: Secondary | ICD-10-CM | POA: Diagnosis not present

## 2018-09-01 DIAGNOSIS — Z9861 Coronary angioplasty status: Secondary | ICD-10-CM | POA: Diagnosis not present

## 2018-09-02 ENCOUNTER — Other Ambulatory Visit: Payer: Self-pay

## 2018-09-02 DIAGNOSIS — C155 Malignant neoplasm of lower third of esophagus: Secondary | ICD-10-CM

## 2018-09-02 MED ORDER — CLOPIDOGREL BISULFATE 75 MG PO TABS: 75.0000 mg | ORAL_TABLET | Freq: Every day | ORAL | 2 refills | Status: DC

## 2018-09-17 DIAGNOSIS — I10 Essential (primary) hypertension: Secondary | ICD-10-CM | POA: Diagnosis not present

## 2018-09-17 DIAGNOSIS — I351 Nonrheumatic aortic (valve) insufficiency: Secondary | ICD-10-CM | POA: Diagnosis not present

## 2018-09-17 DIAGNOSIS — R0602 Shortness of breath: Secondary | ICD-10-CM | POA: Diagnosis not present

## 2018-09-17 DIAGNOSIS — I340 Nonrheumatic mitral (valve) insufficiency: Secondary | ICD-10-CM | POA: Diagnosis not present

## 2018-09-17 DIAGNOSIS — R079 Chest pain, unspecified: Secondary | ICD-10-CM | POA: Diagnosis not present

## 2018-09-17 DIAGNOSIS — I2510 Atherosclerotic heart disease of native coronary artery without angina pectoris: Secondary | ICD-10-CM | POA: Diagnosis not present

## 2018-09-18 ENCOUNTER — Other Ambulatory Visit: Payer: Self-pay | Admitting: *Deleted

## 2018-09-18 DIAGNOSIS — C159 Malignant neoplasm of esophagus, unspecified: Secondary | ICD-10-CM

## 2018-09-18 NOTE — Progress Notes (Deleted)
[place]  [phone] [phone]  ID: [name] OB: 05/31/1946  MR#: [id number]  DTO#:[id number]  Patient Care Team: [name], MD as PCP - General (Family Medicine) [name], MD as PCP - Family Medicine (Family Medicine) [name], MD as Consulting Physician (Oncology) [name], MD as Referring Physician [name], MD as Consulting Physician (Cardiology)  CHIEF COMPLAINT: Stage IIb adenocarcinoma of the lower third of the esophagus.  INTERVAL HISTORY: Patient returns to clinic today for repeat laboratory work and further evaluation.  He had CT scan and evaluation by thoracic surgery at [facility] in March 2019 that revealed no evidence of disease.  He currently feels well and is asymptomatic. He does not complain of dysphasia or difficulty swallowing today.  He has a good appetite and denies weight loss.  He denies any recent fevers or illnesses. He has no neurologic complaints. He denies any chest pain or shortness of breath. He denies any nausea, vomiting, constipation, or diarrhea.  Patient offers no specific complaints today.    REVIEW OF SYSTEMS:   Review of Systems  Constitutional: Negative.  Negative for fever, malaise/fatigue and weight loss.  HENT: Negative.   Respiratory: Negative.  Negative for cough and shortness of breath.   Cardiovascular: Negative.  Negative for chest pain and leg swelling.  Gastrointestinal: Negative.  Negative for abdominal pain, blood in stool, constipation, diarrhea, melena, nausea and vomiting.  Genitourinary: Negative.  Negative for frequency and urgency.       Nocturia  Musculoskeletal: Negative.  Negative for back pain.  Skin: Negative.  Negative for rash.  Neurological: Negative.  Negative for tingling, focal weakness, weakness and headaches.  Psychiatric/Behavioral: Negative.  The patient is not nervous/anxious and does not have insomnia.     As per  HPI. Otherwise, a complete review of systems is negative.  PAST MEDICAL HISTORY: Past Medical History:  Diagnosis Date  . Abnormal white blood cell (WBC) count    seeing Dr. [name] at [place] on 10/25  . Atrial fibrillation ([place])   . BPH (benign prostatic hypertrophy)   . Bradycardia   . CAD (coronary artery disease)   . Dysrhythmia    bradycardia - sees Dr. Humphrey Rolls  . Esophageal cancer ([place]) 05/2016  . GERD (gastroesophageal reflux disease)   . History of kidney stones   . Hyperlipidemia   . Hypertension   . Hypothyroidism   . Myocardial infarction ([place]) 2005  . S/P angioplasty with stent 2005   x 4 vessels  . Sleep apnea    mild-NO CPAP  . Spinal stenosis    lumbar    PAST SURGICAL HISTORY: Past Surgical History:  Procedure Laterality Date  . BACK SURGERY    . CARDIAC CATHETERIZATION Left 03/12/2016   Procedure: Left Heart Cath and Coronary Angiography;  Surgeon: [name], MD;  Location: [place] [facility];  Service: Cardiovascular;  Laterality: Left;  . CARDIAC CATHETERIZATION N/A 03/12/2016   Procedure: Coronary Stent Intervention;  Surgeon: Yolonda Kida, MD;  Location: [place] [facility];  Service: Cardiovascular;  Laterality: N/A;  . COLONOSCOPY WITH PROPOFOL N/A 07/13/2015   Procedure: COLONOSCOPY WITH PROPOFOL;  Surgeon: Lucilla Lame, MD;  Location: [place];  Service: Endoscopy;  Laterality: N/A;  . CORONARY ANGIOPLASTY  10/05 and 12/05   4 DES placed  . ESOPHAGECTOMY  10/07/2016   @ [facility]  . ESOPHAGOGASTRODUODENOSCOPY (EGD) WITH PROPOFOL N/A 06/14/2016   Procedure: ESOPHAGOGASTRODUODENOSCOPY (EGD) WITH  PROPOFOL;  Surgeon: [name], MD;  Location: [facility] ENDOSCOPY;  Service: Endoscopy;  Laterality: N/A;  . HERNIA REPAIR Bilateral    x3  . INGUINAL HERNIA REPAIR Right 08/28/2017   Large PerFix plug, recurrent hernia;  Surgeon: Robert Bellow, MD;  Location: [facility];  Service: General;  Laterality: Right;  recurrent  . POLYPECTOMY   07/13/2015   Procedure: POLYPECTOMY;  Surgeon: Lucilla Lame, MD;  Location: [place];  Service: Endoscopy;;  . PORT-A-CATH REMOVAL N/A 01/16/2017   Procedure: REMOVAL PORT-A-CATH;  Surgeon: Olean Ree, MD;  Location: [facility];  Service: General;  Laterality: N/A;  . PORTACATH PLACEMENT N/A 07/08/2016   Procedure: INSERTION PORT-A-CATH;  Surgeon: Olean Ree, MD;  Location: [facility];  Service: General;  Laterality: N/A;    FAMILY HISTORY Family History  Problem Relation Age of Onset  . Hypertension Mother   . Dementia Mother   . Heart disease Father   . Hypertension Father   . Kidney disease Father   . Hypertension Brother   . Heart disease Brother   . Diabetes Son   . Hypertension Son   . Hyperlipidemia Son   . Hyperlipidemia Daughter   . Hypertension Daughter        ADVANCED DIRECTIVES:    HEALTH MAINTENANCE: Social History   Tobacco Use  . Smoking status: Former Smoker    Packs/day: 1.00    Years: 40.00    Pack years: 40.00    Types: Cigarettes    Last attempt to quit: 07/16/2004    Years since quitting: 14.1  . Smokeless tobacco: Former Systems developer    Types: Chew  Substance Use Topics  . Alcohol use: No    Alcohol/week: 0.0 standard drinks  . Drug use: No     Colonoscopy:  PAP:  Bone density:  Lipid panel:  No Known Allergies  Current Outpatient Medications  Medication Sig Dispense Refill  . acetaminophen (TYLENOL) 500 MG tablet Take 500 mg by mouth every 6 (six) hours as needed for mild pain or moderate pain.    Marland Kitchen aspirin 81 MG tablet Take 81 mg by mouth daily. AM    . atorvastatin (LIPITOR) 40 MG tablet Take 1 tablet (40 mg total) by mouth daily. 90 tablet 4  . clopidogrel (PLAVIX) 75 MG tablet Take 1 tablet (75 mg total) by mouth daily. 90 tablet 2  . isosorbide mononitrate (IMDUR) 30 MG 24 hr tablet Take 30 mg by mouth daily. Takes at night  5  . levothyroxine (SYNTHROID, LEVOTHROID) 50 MCG tablet Take 1 tablet (50 mcg total) by mouth daily.  90 tablet 4  . lisinopril (PRINIVIL,ZESTRIL) 10 MG tablet Take 1 tablet (10 mg total) by mouth every morning. 90 tablet 4  . metoprolol succinate (TOPROL-XL) 25 MG 24 hr tablet Take 25 mg by mouth daily.    . multivitamin-iron-minerals-folic acid (CENTRUM) chewable tablet Chew 1 tablet by mouth daily.    . nitroGLYCERIN (NITROSTAT) 0.4 MG SL tablet Place 1 tablet under the tongue every 5 (five) minutes as needed for chest pain.   98  . pantoprazole (PROTONIX) 20 MG tablet Take 1 tablet (20 mg total) by mouth daily. 90 tablet 4   No current facility-administered medications for this visit.     OBJECTIVE: There were no vitals filed for this visit.   There is no height or weight on file to calculate BMI.    ECOG FS:0 - Asymptomatic  General: Well-developed, well-nourished, no acute distress. Eyes: Pink conjunctiva, anicteric  sclera. HEENT: Normocephalic, moist mucous membranes, clear oropharnyx. Lungs: Clear to auscultation bilaterally. Heart: Regular rate and rhythm. No rubs, murmurs, or gallops. Abdomen: Soft, nontender, nondistended. No organomegaly noted, normoactive bowel sounds. Musculoskeletal: No edema, cyanosis, or clubbing. Neuro: Alert, answering all questions appropriately. Cranial nerves grossly intact. Skin: No rashes or petechiae noted. Psych: Normal affect.  LAB RESULTS:  Lab Results  Component Value Date   NA 141 08/27/2018   K 3.6 08/27/2018   CL 103 08/27/2018   CO2 24 08/27/2018   GLUCOSE 121 (H) 08/27/2018   BUN 15 08/27/2018   CREATININE 1.53 (H) 08/27/2018   CALCIUM 8.5 (L) 08/27/2018   PROT 6.5 08/27/2018   ALBUMIN 4.2 08/27/2018   AST 20 08/27/2018   ALT 20 08/27/2018   ALKPHOS 68 08/27/2018   BILITOT 0.3 08/27/2018   GFRNONAA 45 (L) 08/27/2018   GFRAA 52 (L) 08/27/2018    Lab Results  Component Value Date   WBC 3.1 (L) 08/27/2018   NEUTROABS 1.8 08/27/2018   HGB 12.4 (L) 08/27/2018   HCT 36.5 (L) 08/27/2018   MCV 99 (H) 08/27/2018   PLT 115  (L) 08/27/2018     STUDIES: No results found.  ASSESSMENT: Pathologic Stage IIb (T3, N0, M0) adenocarcinoma of the lower third of the esophagus.  PLAN:    1. Stage IIb adenocarcinoma of the lower third of the esophagus: Patient had EUS completed at [facility] confirming the stage and diagnosis. He completed 6 cycles of weekly carboplatinum and Taxol completed on August 21, 2016 and daily XRT completed on August 27, 2016. Patient had his esophagectomy on October 07, 2016.  Patient's most recent CT scan at [facility] on December 02, 2017 revealed no evidence of progressive or recurrent disease.  Continue follow-up at [facility] every 6 months.  Will alternate evaluation with them every 6 months as well.  Return to clinic in 6 months for further evaluation.  2. Thrombocytopenia: Mild, platelets 115.  Previously entire workup did not reveal a distinct etiology. No bone marrow biopsy has been performed to this point. Continue to monitor closely.  4. Renal insufficiency: Patient's creatinine is mildly elevated at 1.3 which is approximately his baseline.  Monitor. 5. Nocturia: Patient does not complain of this today.  Consider urology referral if symptoms continue to be disruptive. 6.  Leukopenia: Resolved.  Patient expressed understanding and was in agreement with this plan. He also understands that He can call clinic at any time with any questions, concerns, or complaints.    [name], MD 09/18/18 2:03 PM

## 2018-09-24 ENCOUNTER — Inpatient Hospital Stay: Payer: Medicare Other

## 2018-09-24 ENCOUNTER — Inpatient Hospital Stay: Payer: Medicare Other | Admitting: Oncology

## 2018-10-18 ENCOUNTER — Emergency Department
Admission: EM | Admit: 2018-10-18 | Discharge: 2018-10-18 | Disposition: A | Discharge status: Home / Self Care | Payer: Medicare Other | Attending: Student in an Organized Health Care Education/Training Program | Admitting: Student in an Organized Health Care Education/Training Program

## 2018-10-18 ENCOUNTER — Encounter: Payer: Self-pay | Admitting: Emergency Medicine

## 2018-10-18 ENCOUNTER — Emergency Department: Payer: Medicare Other

## 2018-10-18 DIAGNOSIS — Z87891 Personal history of nicotine dependence: Secondary | ICD-10-CM | POA: Diagnosis not present

## 2018-10-18 DIAGNOSIS — Z7982 Long term (current) use of aspirin: Secondary | ICD-10-CM | POA: Diagnosis not present

## 2018-10-18 DIAGNOSIS — E039 Hypothyroidism, unspecified: Secondary | ICD-10-CM | POA: Diagnosis not present

## 2018-10-18 DIAGNOSIS — I129 Hypertensive chronic kidney disease with stage 1 through stage 4 chronic kidney disease, or unspecified chronic kidney disease: Secondary | ICD-10-CM | POA: Insufficient documentation

## 2018-10-18 DIAGNOSIS — N201 Calculus of ureter: Secondary | ICD-10-CM | POA: Insufficient documentation

## 2018-10-18 DIAGNOSIS — I2510 Atherosclerotic heart disease of native coronary artery without angina pectoris: Secondary | ICD-10-CM | POA: Insufficient documentation

## 2018-10-18 DIAGNOSIS — N183 Chronic kidney disease, stage 3 (moderate): Secondary | ICD-10-CM | POA: Diagnosis not present

## 2018-10-18 DIAGNOSIS — Z955 Presence of coronary angioplasty implant and graft: Secondary | ICD-10-CM | POA: Insufficient documentation

## 2018-10-18 DIAGNOSIS — Z7902 Long term (current) use of antithrombotics/antiplatelets: Secondary | ICD-10-CM | POA: Insufficient documentation

## 2018-10-18 DIAGNOSIS — Z79899 Other long term (current) drug therapy: Secondary | ICD-10-CM | POA: Diagnosis not present

## 2018-10-18 DIAGNOSIS — R109 Unspecified abdominal pain: Secondary | ICD-10-CM

## 2018-10-18 DIAGNOSIS — R1032 Left lower quadrant pain: Secondary | ICD-10-CM | POA: Diagnosis not present

## 2018-10-18 DIAGNOSIS — N132 Hydronephrosis with renal and ureteral calculous obstruction: Secondary | ICD-10-CM | POA: Diagnosis not present

## 2018-10-18 LAB — URINALYSIS, COMPLETE (UACMP) WITH MICROSCOPIC
BACTERIA UA: NONE SEEN
BILIRUBIN URINE: NEGATIVE
Glucose, UA: NEGATIVE mg/dL
KETONES UR: NEGATIVE mg/dL
Leukocytes, UA: NEGATIVE
Nitrite: NEGATIVE
PROTEIN: NEGATIVE mg/dL
Specific Gravity, Urine: 1.01 (ref 1.005–1.030)
pH: 5 (ref 5.0–8.0)

## 2018-10-18 LAB — COMPREHENSIVE METABOLIC PANEL
ALBUMIN: 4.7 g/dL (ref 3.5–5.0)
ALT: 28 U/L (ref 0–44)
AST: 30 U/L (ref 15–41)
Alkaline Phosphatase: 62 U/L (ref 38–126)
Anion gap: 7 (ref 5–15)
BILIRUBIN TOTAL: 1 mg/dL (ref 0.3–1.2)
BUN: 19 mg/dL (ref 8–23)
CO2: 29 mmol/L (ref 22–32)
Calcium: 9.2 mg/dL (ref 8.9–10.3)
Chloride: 103 mmol/L (ref 98–111)
Creatinine, Ser: 1.43 mg/dL — ABNORMAL HIGH (ref 0.61–1.24)
GFR calc Af Amer: 56 mL/min — ABNORMAL LOW (ref >60)
GFR calc non Af Amer: 49 mL/min — ABNORMAL LOW (ref >60)
Glucose, Bld: 148 mg/dL — ABNORMAL HIGH (ref 70–99)
POTASSIUM: 4.1 mmol/L (ref 3.5–5.1)
Sodium: 139 mmol/L (ref 135–145)
TOTAL PROTEIN: 8 g/dL (ref 6.5–8.1)

## 2018-10-18 LAB — CBC
HCT: 40.9 % (ref 39.0–52.0)
HEMOGLOBIN: 13.6 g/dL (ref 13.0–17.0)
MCH: 33.2 pg (ref 26.0–34.0)
MCHC: 33.3 g/dL (ref 30.0–36.0)
MCV: 99.8 fL (ref 80.0–100.0)
Platelets: 113 10*3/uL — ABNORMAL LOW (ref 150–400)
RBC: 4.1 MIL/uL — AB (ref 4.22–5.81)
RDW: 13.5 % (ref 11.5–15.5)
WBC: 4.8 10*3/uL (ref 4.0–10.5)
nRBC: 0 % (ref 0.0–0.2)

## 2018-10-18 LAB — LIPASE, BLOOD: Lipase: 29 U/L (ref 11–51)

## 2018-10-18 MED ORDER — OXYCODONE-ACETAMINOPHEN 5-325 MG PO TABS
1.0000 | ORAL_TABLET | ORAL | Status: DC | PRN
  Administered 2018-10-18: 1 via ORAL

## 2018-10-18 MED ORDER — TAMSULOSIN HCL 0.4 MG PO CAPS
0.4000 mg | ORAL_CAPSULE | Freq: Every day | ORAL | Status: DC
  Administered 2018-10-18: 0.4 mg via ORAL
  Filled 2018-10-18: qty 1

## 2018-10-18 MED ORDER — SODIUM CHLORIDE 0.9 % IV BOLUS
500.0000 mL | Freq: Once | INTRAVENOUS | Status: AC
  Administered 2018-10-18: 500 mL via INTRAVENOUS

## 2018-10-18 MED ORDER — HYDROCODONE-ACETAMINOPHEN 5-325 MG PO TABS: 1.0000 | ORAL_TABLET | ORAL | 0 refills | Status: DC | PRN

## 2018-10-18 MED ORDER — KETOROLAC TROMETHAMINE 30 MG/ML IJ SOLN
15.0000 mg | Freq: Once | INTRAMUSCULAR | Status: AC
  Administered 2018-10-18: 15 mg via INTRAVENOUS
  Filled 2018-10-18: qty 1

## 2018-10-18 MED ORDER — ONDANSETRON HCL 4 MG PO TABS: 4.0000 mg | ORAL_TABLET | Freq: Every day | ORAL | 0 refills | Status: DC | PRN

## 2018-10-18 MED ORDER — TAMSULOSIN HCL 0.4 MG PO CAPS: 0.4000 mg | ORAL_CAPSULE | Freq: Every day | ORAL | 0 refills | Status: DC

## 2018-10-18 MED ORDER — ONDANSETRON 4 MG PO TBDP
4.0000 mg | ORAL_TABLET | Freq: Once | ORAL | Status: AC | PRN
  Administered 2018-10-18: 4 mg via ORAL
  Filled 2018-10-18: qty 1

## 2018-10-18 MED ORDER — OXYCODONE-ACETAMINOPHEN 5-325 MG PO TABS
ORAL_TABLET | ORAL | Status: AC
  Administered 2018-10-18: 1 via ORAL
  Filled 2018-10-18: qty 1

## 2018-10-18 NOTE — ED Provider Notes (Signed)
[facility] Emergency Department Provider Note    First MD Initiated Contact with Patient 10/18/18 [phone]     (approximate)  I have reviewed the triage vital signs and the nursing notes.   HISTORY  Chief Complaint Flank Pain    HPI [name] is a 73 y.o. male with a history as listed below presents the ER for sudden onset left lower quadrant flank pain radiating to his left groin.  Rates the pain is moderate to severe nature was waxing and waning.  Denies any fevers.  Has had similar pain in the past related to kidney stones.  Denies any hematuria.    Past Medical History:  Diagnosis Date  . Abnormal white blood cell (WBC) count    seeing Dr. [name] at [facility] [facility] on 10/25  . Atrial fibrillation ([place])   . BPH (benign prostatic hypertrophy)   . Bradycardia   . CAD (coronary artery disease)   . Dysrhythmia    bradycardia - sees Dr. Humphrey Rolls  . Esophageal cancer ([place]) 05/2016  . GERD (gastroesophageal reflux disease)   . History of kidney stones   . Hyperlipidemia   . Hypertension   . Hypothyroidism   . Myocardial infarction ([place]) 2005  . S/P angioplasty with stent 2005   x 4 vessels  . Sleep apnea    mild-NO CPAP  . Spinal stenosis    lumbar   Family History  Problem Relation Age of Onset  . Hypertension Mother   . Dementia Mother   . Heart disease Father   . Hypertension Father   . Kidney disease Father   . Hypertension Brother   . Heart disease Brother   . Diabetes Son   . Hypertension Son   . Hyperlipidemia Son   . Hyperlipidemia Daughter   . Hypertension Daughter    Past Surgical History:  Procedure Laterality Date  . BACK SURGERY    . CARDIAC CATHETERIZATION Left 03/12/2016   Procedure: Left Heart Cath and Coronary Angiography;  Surgeon: [facility] David, MD;  Location: [facility];  Service: Cardiovascular;  Laterality: Left;  . CARDIAC CATHETERIZATION N/A 03/12/2016   Procedure: Coronary Stent Intervention;  Surgeon:  [facility] Kida, MD;  Location: [facility];  Service: Cardiovascular;  Laterality: N/A;  . COLONOSCOPY WITH PROPOFOL N/A 07/13/2015   Procedure: COLONOSCOPY WITH PROPOFOL;  Surgeon: [facility] Lame, MD;  Location: [place];  Service: [place];  Laterality: N/A;  . CORONARY ANGIOPLASTY  10/05 and 12/05   4 DES placed  . ESOPHAGECTOMY  10/07/2016   @ [facility]  . ESOPHAGOGASTRODUODENOSCOPY (EGD) WITH PROPOFOL N/A 06/14/2016   Procedure: ESOPHAGOGASTRODUODENOSCOPY (EGD) WITH PROPOFOL;  Surgeon: [name], MD;  Location: [facility];  Service: [place];  Laterality: N/A;  . HERNIA REPAIR Bilateral    x3  . INGUINAL HERNIA REPAIR Right 08/28/2017   Large PerFix plug, recurrent hernia;  Surgeon: Robert Bellow, MD;  Location: [facility];  Service: General;  Laterality: Right;  recurrent  . POLYPECTOMY  07/13/2015   Procedure: POLYPECTOMY;  Surgeon: [facility] Lame, MD;  Location: [facility];  Service: [place];;  . PORT-A-CATH REMOVAL N/A 01/16/2017   Procedure: REMOVAL PORT-A-CATH;  Surgeon: [facility] Ree, MD;  Location: [facility];  Service: General;  Laterality: N/A;  . PORTACATH PLACEMENT N/A 07/08/2016   Procedure: INSERTION PORT-A-CATH;  Surgeon: [facility] Ree, MD;  Location: [facility];  Service: General;  Laterality: N/A;   Patient Active Problem List   Diagnosis  Date Noted  . Senile purpura ([facility] [facility]) 08/27/2018  . Advanced care planning/counseling discussion 07/29/2017  . Recurrent inguinal hernia of right side without obstruction or gangrene 07/29/2017  . Nephrolithiasis 02/02/2017  . Esophageal cancer ([facility] [facility]) 06/23/2016  . Leukopenia 04/10/2016  . Thrombocytopenia ([facility]) 04/10/2016  . Hypertensive kidney disease with CKD stage III ([place]) 06/22/2015  . CAD (coronary artery disease) 06/21/2015  . BPH (benign prostatic hyperplasia) 06/21/2015  . Acid reflux 06/21/2015  . Hypothyroid   . Hyperlipidemia   . Hypertension       Prior to Admission medications     Medication Sig Start Date End Date Taking? Authorizing Provider  acetaminophen (TYLENOL) 500 MG tablet Take 500 mg by mouth every 6 (six) hours as needed for mild pain or moderate pain.    [provider]  aspirin 81 MG tablet Take 81 mg by mouth daily. AM    [provider]  atorvastatin (LIPITOR) 40 MG tablet Take 1 tablet (40 mg total) by mouth daily. 08/27/18   [facility] Maple, MD  clopidogrel (PLAVIX) 75 MG tablet Take 1 tablet (75 mg total) by mouth daily. 09/02/18   [place] Maple, MD  isosorbide mononitrate (IMDUR) 30 MG 24 hr tablet Take 30 mg by mouth daily. Takes at night 06/12/15   [provider]  levothyroxine (SYNTHROID, LEVOTHROID) 50 MCG tablet Take 1 tablet (50 mcg total) by mouth daily. 08/27/18   [facility] Maple, MD  lisinopril (PRINIVIL,ZESTRIL) 10 MG tablet Take 1 tablet (10 mg total) by mouth every morning. 08/27/18   [facility] Maple, MD  metoprolol succinate (TOPROL-XL) 25 MG 24 hr tablet Take 25 mg by mouth daily.    [provider]  multivitamin-iron-minerals-folic acid (CENTRUM) chewable tablet Chew 1 tablet by mouth daily.    [provider]  nitroGLYCERIN (NITROSTAT) 0.4 MG SL tablet Place 1 tablet under the tongue every 5 (five) minutes as needed for chest pain.  03/08/16   [provider]  pantoprazole (PROTONIX) 20 MG tablet Take 1 tablet (20 mg total) by mouth daily. 08/10/18   [place] Maple, MD    Allergies Patient has no known allergies.    Social History Social History   Tobacco Use  . Smoking status: Former Smoker    Packs/day: 1.00    Years: 40.00    Pack years: 40.00    Types: Cigarettes    Last attempt to quit: 07/16/2004    Years since quitting: 14.2  . Smokeless tobacco: Former Systems developer    Types: Chew  Substance Use Topics  . Alcohol use: No    Alcohol/week: 0.0 standard drinks  . Drug use: No    Review of Systems Patient denies headaches, rhinorrhea, blurry vision, numbness,  shortness of breath, chest pain, edema, cough, abdominal pain, nausea, vomiting, diarrhea, dysuria, fevers, rashes or hallucinations unless otherwise stated above in HPI. ____________________________________________   PHYSICAL EXAM:  VITAL SIGNS: Vitals:   10/18/18 0642  BP: (!) 177/87  Pulse: 69  Resp: 18  Temp: (!) 97.5 F (36.4 C)  SpO2: 97%    Constitutional: Alert and oriented.  Eyes: Conjunctivae are normal.  Head: Atraumatic. Nose: No congestion/rhinnorhea. Mouth/Throat: Mucous membranes are moist.   Neck: No stridor. Painless ROM.  Cardiovascular: Normal rate, regular rhythm. Grossly normal heart sounds.  Good peripheral circulation. Respiratory: Normal respiratory effort.  No retractions. Lungs CTAB. Gastrointestinal: Soft and nontender. No distention. No abdominal bruits. + left CVA tenderness. Genitourinary:  Musculoskeletal: No lower extremity tenderness nor  edema.  No joint effusions. Neurologic:  Normal speech and language. No gross focal neurologic deficits are appreciated. No facial droop Skin:  Skin is warm, dry and intact. No rash noted. Psychiatric: Mood and affect are normal. Speech and behavior are normal.  ____________________________________________   LABS (all labs ordered are listed, but only abnormal results are displayed)  Results for orders placed or performed during the hospital encounter of 10/18/18 (from the past 24 hour(s))  Lipase, blood     Status: None   Collection Time: 10/18/18  6:47 AM  Result Value Ref Range   Lipase 29 11 - 51 U/L  Comprehensive metabolic panel     Status: Abnormal   Collection Time: 10/18/18  6:47 AM  Result Value Ref Range   Sodium 139 135 - 145 mmol/L   Potassium 4.1 3.5 - 5.1 mmol/L   Chloride 103 98 - 111 mmol/L   CO2 29 22 - 32 mmol/L   Glucose, Bld 148 (H) 70 - 99 mg/dL   BUN 19 8 - 23 mg/dL   Creatinine, Ser 1.43 (H) 0.61 - 1.24 mg/dL   Calcium 9.2 8.9 - 10.3 mg/dL   Total Protein 8.0 6.5 - 8.1 g/dL     Albumin 4.7 3.5 - 5.0 g/dL   AST 30 15 - 41 U/L   ALT 28 0 - 44 U/L   Alkaline Phosphatase 62 38 - 126 U/L   Total Bilirubin 1.0 0.3 - 1.2 mg/dL   GFR calc non Af Amer 49 (L) >60 mL/min   GFR calc Af Amer 56 (L) >60 mL/min   Anion gap 7 5 - 15  CBC     Status: Abnormal   Collection Time: 10/18/18  6:47 AM  Result Value Ref Range   WBC 4.8 4.0 - 10.5 K/uL   RBC 4.10 (L) 4.22 - 5.81 MIL/uL   Hemoglobin 13.6 13.0 - 17.0 g/dL   HCT 40.9 39.0 - 52.0 %   MCV 99.8 80.0 - 100.0 fL   MCH 33.2 26.0 - 34.0 pg   MCHC 33.3 30.0 - 36.0 g/dL   RDW 13.5 11.5 - 15.5 %   Platelets 113 (L) 150 - 400 K/uL   nRBC 0.0 0.0 - 0.2 %  Urinalysis, Complete w Microscopic     Status: Abnormal   Collection Time: 10/18/18  6:47 AM  Result Value Ref Range   Color, Urine STRAW (A) YELLOW   APPearance CLEAR (A) CLEAR   Specific Gravity, Urine 1.010 1.005 - 1.030   pH 5.0 5.0 - 8.0   Glucose, UA NEGATIVE NEGATIVE mg/dL   Hgb urine dipstick SMALL (A) NEGATIVE   Bilirubin Urine NEGATIVE NEGATIVE   Ketones, ur NEGATIVE NEGATIVE mg/dL   Protein, ur NEGATIVE NEGATIVE mg/dL   Nitrite NEGATIVE NEGATIVE   Leukocytes, UA NEGATIVE NEGATIVE   RBC / HPF 0-5 0 - 5 RBC/hpf   WBC, UA 0-5 0 - 5 WBC/hpf   Bacteria, UA NONE SEEN NONE SEEN   Squamous Epithelial / LPF 0-5 0 - 5   Mucus PRESENT    Sperm, UA PRESENT    ____________________________________________  ____________________________________________  RADIOLOGY  I personally reviewed all radiographic images ordered to evaluate for the above acute complaints and reviewed radiology reports and findings.  These findings were personally discussed with the patient.  Please see medical record for radiology report.  ____________________________________________   PROCEDURES  Procedure(s) performed:  Procedures    Critical Care performed: no ____________________________________________   INITIAL IMPRESSION / ASSESSMENT AND  PLAN / ED COURSE  Pertinent labs  & imaging results that were available during my care of the patient were reviewed by me and considered in my medical decision making (see chart for details).   DDX: Stone, AAA, pyelonephritis, musculoskeletal strain, hernia, colitis, inguinal nerve pain  SIERRA BISSONETTE is a 73 y.o. who presents to the ED with left flank pain consistent with probable ureterolithiasis.  No evidence of sepsis.  Will give IV fluids as well as IV pain medication.  CT imaging will be ordered for the above differential.  Clinical Course as of Oct 18 1138  Sun Oct 18, 2018  1139 Patient reassessed symptoms significantly improved.  At this point believe he stable and appropriate for outpatient follow-up.  Has been given referral to urology as an outpatient.  Discussed signs and symptoms for which she should return to the ER.   [PR]    Clinical Course User Index [PR] Merlyn Lot, MD     As part of my medical decision making, I reviewed the following data within the [place] notes reviewed and incorporated, Labs reviewed, notes from prior ED visits and [place] Controlled Substance Database   ____________________________________________   FINAL CLINICAL IMPRESSION(S) / ED DIAGNOSES  Final diagnoses:  Left flank pain  Ureterolithiasis      NEW MEDICATIONS STARTED DURING THIS VISIT:  New Prescriptions   No medications on file     Note:  This document was prepared using Dragon voice recognition software and may include unintentional dictation errors.    Merlyn Lot, MD 10/18/18 [phone]

## 2018-10-18 NOTE — ED Triage Notes (Signed)
Patient with complaint of left flank pain with vomiting that started about 01:00. Patient states that he has a history of kidney stones.

## 2018-10-28 ENCOUNTER — Encounter: Payer: Self-pay | Admitting: Urology

## 2018-10-28 ENCOUNTER — Ambulatory Visit (INDEPENDENT_AMBULATORY_CARE_PROVIDER_SITE_OTHER): Payer: Medicare Other | Admitting: Urology

## 2018-10-28 VITALS — BP 133/71 | HR 57 | Ht 69.0 in | Wt 155.6 lb

## 2018-10-28 DIAGNOSIS — R3129 Other microscopic hematuria: Secondary | ICD-10-CM

## 2018-10-28 DIAGNOSIS — N200 Calculus of kidney: Secondary | ICD-10-CM

## 2018-10-28 DIAGNOSIS — N201 Calculus of ureter: Secondary | ICD-10-CM | POA: Diagnosis not present

## 2018-10-28 LAB — URINALYSIS, COMPLETE
Bilirubin, UA: NEGATIVE
Glucose, UA: NEGATIVE
Ketones, UA: NEGATIVE
Leukocytes, UA: NEGATIVE
Nitrite, UA: NEGATIVE
Protein, UA: NEGATIVE
Specific Gravity, UA: >1.03 — ABNORMAL HIGH (ref 1.005–1.030)
Urobilinogen, Ur: 0.2 mg/dL (ref 0.2–1.0)
pH, UA: 5 (ref 5.0–7.5)

## 2018-10-28 LAB — MICROSCOPIC EXAMINATION
Bacteria, UA: NONE SEEN
Epithelial Cells (non renal): NONE SEEN /hpf (ref 0–10)

## 2018-10-28 NOTE — Progress Notes (Signed)
10/28/2018 10:12 AM   Corey Perry 05/22/1946 [id number]  Referring provider: [name], MD [address] [place], [place] [address]  Chief Complaint  Patient presents with  . Nephrolithiasis    HPI:  Corey Perry is a 73 year old white male who developed left lower quadrant pain and flank pain August 18, 2019 radiating to the left groin.  His white count was 4.8, creatinine 1.43 which is baseline for him.  Urinalysis was clear.  No microscopic hematuria.  He underwent CT scan of the abdomen and pelvis which revealed a 3 mm stone in the left distal ureter a centimeter or 2 from the left UVJ.  It was difficult to see the stone on the scout image.  There was an 8 mm left lower pole stone clearly visible on the scout image. He had pain for 6 days into the left groin and then the pain resolved but he didn't see a stone pass. No pain today. No gross hematuria.   He is on plavix for cardiac stents but can stop.   He had esophageal cancer and surgery and XRT and chemo in 2017. He passed a stone back in 2015 or 2016.   He typically voids with a good flow. He carries NTG but not had to take it.   Modifying factors: There are no other modifying factors  Associated signs and symptoms: There are no other associated signs and symptoms Aggravating and relieving factors: There are no other aggravating or relieving factors Severity: Moderate Duration: Persistent    PMH: Past Medical History:  Diagnosis Date  . Abnormal white blood cell (WBC) count    seeing Corey Perry at [facility] on 10/25  . Atrial fibrillation ([place])   . BPH (benign prostatic hypertrophy)   . Bradycardia   . CAD (coronary artery disease)   . Dysrhythmia    bradycardia - sees Corey Perry  . Esophageal cancer ([place]) 05/2016  . GERD (gastroesophageal reflux disease)   . History of kidney stones   . Hyperlipidemia   . Hypertension   . Hypothyroidism   . Myocardial infarction ([place]) 2005  . S/P angioplasty  with stent 2005   x 4 vessels  . Sleep apnea    mild-NO CPAP  . Spinal stenosis    lumbar    Surgical History: Past Surgical History:  Procedure Laterality Date  . BACK SURGERY    . CARDIAC CATHETERIZATION Left 03/12/2016   Procedure: Left Heart Cath and Coronary Angiography;  Surgeon: Corey David, MD;  Location: [facility];  Service: Cardiovascular;  Laterality: Left;  . CARDIAC CATHETERIZATION N/A 03/12/2016   Procedure: Coronary Stent Intervention;  Surgeon: Corey Kida, MD;  Location: [facility];  Service: Cardiovascular;  Laterality: N/A;  . COLONOSCOPY WITH PROPOFOL N/A 07/13/2015   Procedure: COLONOSCOPY WITH PROPOFOL;  Surgeon: Corey Lame, MD;  Location: [facility];  Service: Endoscopy;  Laterality: N/A;  . CORONARY ANGIOPLASTY  10/05 and 12/05   4 DES placed  . ESOPHAGECTOMY  10/07/2016   @ [facility]  . ESOPHAGOGASTRODUODENOSCOPY (EGD) WITH PROPOFOL N/A 06/14/2016   Procedure: ESOPHAGOGASTRODUODENOSCOPY (EGD) WITH PROPOFOL;  Surgeon: Corey Sails, MD;  Location: [facility];  Service: Endoscopy;  Laterality: N/A;  . HERNIA REPAIR Bilateral    x3  . INGUINAL HERNIA REPAIR Right 08/28/2017   Large PerFix plug, recurrent hernia;  Surgeon: Corey Bellow, MD;  Location: [facility];  Service: General;  Laterality: Right;  recurrent  .  POLYPECTOMY  07/13/2015   Procedure: POLYPECTOMY;  Surgeon: Corey Lame, MD;  Location: [facility];  Service: Endoscopy;;  . PORT-A-CATH REMOVAL N/A 01/16/2017   Procedure: REMOVAL PORT-A-CATH;  Surgeon: Corey Ree, MD;  Location: [facility];  Service: General;  Laterality: N/A;  . PORTACATH PLACEMENT N/A 07/08/2016   Procedure: INSERTION PORT-A-CATH;  Surgeon: Corey Ree, MD;  Location: [facility];  Service: General;  Laterality: N/A;    Home Medications:  Allergies as of 10/28/2018   No Known Allergies     Medication List       Accurate as of October 28, 2018 10:12 AM. Always use your most  recent med list.        acetaminophen 500 MG tablet Commonly known as:  TYLENOL Take 500 mg by mouth every 6 (six) hours as needed for mild pain or moderate pain.   aspirin 81 MG tablet Take 81 mg by mouth daily. AM   atorvastatin 40 MG tablet Commonly known as:  LIPITOR Take 1 tablet (40 mg total) by mouth daily.   clopidogrel 75 MG tablet Commonly known as:  PLAVIX Take 1 tablet (75 mg total) by mouth daily.   HYDROcodone-acetaminophen 5-325 MG tablet Commonly known as:  NORCO Take 1 tablet by mouth every 4 (four) hours as needed for moderate pain.   isosorbide mononitrate 30 MG 24 hr tablet Commonly known as:  IMDUR Take 30 mg by mouth daily. Takes at night   levothyroxine 50 MCG tablet Commonly known as:  SYNTHROID, LEVOTHROID Take 1 tablet (50 mcg total) by mouth daily.   lisinopril 10 MG tablet Commonly known as:  PRINIVIL,ZESTRIL Take 1 tablet (10 mg total) by mouth every morning.   metoprolol succinate 25 MG 24 hr tablet Commonly known as:  TOPROL-XL Take 25 mg by mouth daily.   multivitamin-iron-minerals-folic acid chewable tablet Chew 1 tablet by mouth daily.   nitroGLYCERIN 0.4 MG SL tablet Commonly known as:  NITROSTAT Place 1 tablet under the tongue every 5 (five) minutes as needed for chest pain.   ondansetron 4 MG tablet Commonly known as:  ZOFRAN Take 1 tablet (4 mg total) by mouth daily as needed for nausea or vomiting.   pantoprazole 20 MG tablet Commonly known as:  PROTONIX Take 1 tablet (20 mg total) by mouth daily.   tamsulosin 0.4 MG Caps capsule Commonly known as:  FLOMAX Take 1 capsule (0.4 mg total) by mouth daily after supper.       Allergies: No Known Allergies  Family History: Family History  Problem Relation Age of Onset  . Hypertension Mother   . Dementia Mother   . Heart disease Father   . Hypertension Father   . Kidney disease Father   . Hypertension Brother   . Heart disease Brother   . Diabetes Son   .  Hypertension Son   . Hyperlipidemia Son   . Hyperlipidemia Daughter   . Hypertension Daughter     Social History:  reports that he quit smoking about 14 years ago. His smoking use included cigarettes. He has a 40.00 pack-year smoking history. He has quit using smokeless tobacco.  His smokeless tobacco use included chew. He reports that he does not drink alcohol or use drugs.  ROS: UROLOGY Frequent Urination?: No Hard to postpone urination?: No Burning/pain with urination?: No Get up at night to urinate?: Yes Leakage of urine?: No Urine stream starts and stops?: No Trouble starting stream?: No Do you have to strain to urinate?: No Blood  in urine?: No Urinary tract infection?: No Sexually transmitted disease?: No Injury to kidneys or bladder?: No Painful intercourse?: No Weak stream?: No Erection problems?: No Penile pain?: No  Gastrointestinal Nausea?: Yes Vomiting?: Yes Indigestion/heartburn?: No Diarrhea?: No Constipation?: No  Constitutional Fever: No Night sweats?: No Weight loss?: No Fatigue?: No  Skin Skin rash/lesions?: No Itching?: No  Eyes Blurred vision?: No Double vision?: No  Ears/Nose/Throat Sore throat?: No Sinus problems?: No  Hematologic/Lymphatic Swollen glands?: No Easy bruising?: No  Cardiovascular Leg swelling?: No Chest pain?: No  Respiratory Cough?: No Shortness of breath?: No  Endocrine Excessive thirst?: No  Musculoskeletal Back pain?: Yes Joint pain?: No  Neurological Headaches?: No Dizziness?: No  Psychologic Depression?: No Anxiety?: No  Physical Exam: BP 133/71 (BP Location: Left Arm, Patient Position: Sitting, Cuff Size: Normal)   Pulse (!) 57   Ht 5\' 9"  (1.753 m)   Wt 70.6 kg   BMI 22.98 kg/m   Constitutional:  Alert and oriented, No acute distress. HEENT: [address] AT, moist mucus membranes.  Trachea midline, no masses. Cardiovascular: No clubbing, cyanosis, or edema. Respiratory: Normal respiratory  effort, no increased work of breathing. GI: Abdomen is soft, nontender, nondistended, no abdominal masses GU: No CVA tenderness Lymph: No cervical or inguinal lymphadenopathy. Skin: No rashes, bruises or suspicious lesions. Neurologic: Grossly intact, no focal deficits, moving all 4 extremities. Psychiatric: Normal mood and affect.  Laboratory Data: Lab Results  Component Value Date   WBC 4.8 10/18/2018   HGB 13.6 10/18/2018   HCT 40.9 10/18/2018   MCV 99.8 10/18/2018   PLT 113 (L) 10/18/2018    Lab Results  Component Value Date   CREATININE 1.43 (H) 10/18/2018    No results found for: PSA  No results found for: TESTOSTERONE  No results found for: HGBA1C  Urinalysis    Component Value Date/Time   COLORURINE STRAW (A) 10/18/2018 0647   APPEARANCEUR CLEAR (A) 10/18/2018 0647   APPEARANCEUR Clear 08/27/2018 1332   LABSPEC 1.010 10/18/2018 0647   PHURINE 5.0 10/18/2018 [place] 10/18/2018 0647   HGBUR SMALL (A) 10/18/2018 [place] 10/18/2018 0647   BILIRUBINUR Negative 08/27/2018 1332   [place] 10/18/2018 0647   PROTEINUR NEGATIVE 10/18/2018 0647   NITRITE NEGATIVE 10/18/2018 0647   LEUKOCYTESUR NEGATIVE 10/18/2018 0647   LEUKOCYTESUR Negative 08/27/2018 1332    Lab Results  Component Value Date   LABMICR See below: 12/25/2016   WBCUA 0-5 12/25/2016   RBCUA >30 (H) 12/25/2016   LABEPIT 0-10 12/25/2016   MUCUS Present 06/21/2015   BACTERIA NONE SEEN 10/18/2018    Pertinent Imaging: CT  Results for orders placed during the hospital encounter of 06/22/16  DG Abdomen 1 View   Narrative CLINICAL DATA:  Burning sensation while trying to have a bowel movement. Recent large bloody stool.  EXAM: ABDOMEN - 1 VIEW  COMPARISON:  CT scan March 10, 2009  FINDINGS: High attenuation in the pelvis is likely contrast from previous CT imaging within colonic diverticuli. There are probably a few diverticula in the right  colon as well. No free air, portal venous gas, or pneumatosis. No bowel obstruction. The lung bases are normal.  IMPRESSION: Contrast within colonic diverticuli. No acute abnormalities are seen.   Electronically Signed   By: Dorise Bullion III M.D   On: 06/22/2016 21:35    No results found for this or any previous visit. No results found for this or any previous visit. No results found  for this or any previous visit. Results for orders placed during the hospital encounter of 01/02/17  US Renal   Narrative CLINICAL DATA:  Urinary tract stones, left-sided hydronephrosis.  EXAM: RENAL / URINARY TRACT ULTRASOUND COMPLETE  COMPARISON:  CT urogram of December 21, 2016  FINDINGS: Right Kidney:  Length: 11.0 cm. The renal cortical echotexture remains slightly lower than that of the adjacent liver. No mass or hydronephrosis visualized.  Left Kidney:  Length: 11.9 cm. The renal cortical echotexture is similar to that on the right. There is no hydronephrosis. The upper pole cystic structure demonstrated on the CT scan is not clearly evident on today's study. No calcified stones are observed.  Bladder:  The partially distended urinary bladder is unremarkable.  IMPRESSION: No hydronephrosis. No visible stones. Slight increased renal cortical echotexture bilaterally that is still lower than that of the adjacent liver may reflect medical renal disease.   Electronically Signed   By: Perry  Martinique M.D.   On: 01/02/2017 16:32    No results found for this or any previous visit. No results found for this or any previous visit. Results for orders placed during the hospital encounter of 10/18/18  CT Renal Stone Study   Narrative CLINICAL DATA:  Acute left flank pain.  EXAM: CT ABDOMEN AND PELVIS WITHOUT CONTRAST  TECHNIQUE: Multidetector CT imaging of the abdomen and pelvis was performed following the standard protocol without IV contrast.  COMPARISON:  CT scan of December 21, 2016.  FINDINGS: Lower chest: Large sliding-type hiatal hernia is noted. Visualized lung bases are unremarkable.  Hepatobiliary: No focal liver abnormality is seen. No gallstones, gallbladder wall thickening, or biliary dilatation.  Pancreas: Unremarkable. No pancreatic ductal dilatation or surrounding inflammatory changes.  Spleen: Normal in size without focal abnormality.  Adrenals/Urinary Tract: Adrenal glands appear normal. Right kidney and ureter are unremarkable. 1 cm nonobstructive calculus is noted in lower pole calyx of left kidney. Mild left hydronephrosis is noted secondary to 3 mm calculus in distal left ureter. Urinary bladder is unremarkable.  Stomach/Bowel: Stomach is within normal limits. Appendix appears normal. No evidence of bowel wall thickening, distention, or inflammatory changes. Sigmoid diverticulosis is noted without inflammation.  Vascular/Lymphatic: Atherosclerosis of abdominal aorta is noted. 3.2 cm infrarenal abdominal aortic aneurysm is noted. No adenopathy is noted.  Reproductive: Prostate is unremarkable.  Other: No abdominal wall hernia or abnormality. No abdominopelvic ascites.  Musculoskeletal: No acute or significant osseous findings.  IMPRESSION: Mild left hydroureteronephrosis is noted secondary to 3 mm calculus in distal left ureter. 1 cm nonobstructive calculus seen in left kidney.  3.2 cm infrarenal abdominal aortic aneurysm. Recommend followup by ultrasound in 3 years. This recommendation follows ACR consensus guidelines: White Paper of the ACR Incidental Findings Committee II on Vascular Findings. J Am Coll Radiol 2013; 10:789-794  Large sliding-type hiatal hernia.  Sigmoid diverticulosis without inflammation.  Aortic Atherosclerosis (ICD10-I70.0).   Electronically Signed   By: Marijo Conception, M.D.   On: 10/18/2018 09:33     Assessment & Plan:    1. Left ureteral stone He may have passed the stone but we want to  make sure the hydro resolved and he has MH. I'll set up for a renal US.   2. Left renal stone - discussed nature r/b of surveillance, URS or ESWL. He will consider. Discussed risk of bleeding among others with ESWL but they are leaning that way.  - Urinalysis, Complete  No follow-ups on file.  Festus Aloe, MD  [facility] [address], [place], [place] [address] [phone]

## 2018-10-28 NOTE — Patient Instructions (Signed)

## 2018-11-05 DIAGNOSIS — R590 Localized enlarged lymph nodes: Secondary | ICD-10-CM | POA: Diagnosis not present

## 2018-11-05 DIAGNOSIS — C155 Malignant neoplasm of lower third of esophagus: Secondary | ICD-10-CM | POA: Diagnosis not present

## 2018-11-05 DIAGNOSIS — Z87891 Personal history of nicotine dependence: Secondary | ICD-10-CM | POA: Diagnosis not present

## 2018-11-05 DIAGNOSIS — R918 Other nonspecific abnormal finding of lung field: Secondary | ICD-10-CM | POA: Diagnosis not present

## 2018-11-05 DIAGNOSIS — Z9049 Acquired absence of other specified parts of digestive tract: Secondary | ICD-10-CM | POA: Diagnosis not present

## 2018-11-06 ENCOUNTER — Ambulatory Visit
Admission: RE | Admit: 2018-11-06 | Discharge: 2018-11-06 | Disposition: A | Discharge status: Home / Self Care | Payer: Medicare Other | Source: Ambulatory Visit | Attending: Urology | Admitting: Urology

## 2018-11-06 DIAGNOSIS — N200 Calculus of kidney: Secondary | ICD-10-CM | POA: Insufficient documentation

## 2018-11-06 DIAGNOSIS — R3129 Other microscopic hematuria: Secondary | ICD-10-CM | POA: Diagnosis not present

## 2018-11-06 DIAGNOSIS — N201 Calculus of ureter: Secondary | ICD-10-CM | POA: Diagnosis not present

## 2018-11-11 ENCOUNTER — Ambulatory Visit (INDEPENDENT_AMBULATORY_CARE_PROVIDER_SITE_OTHER): Payer: Medicare Other | Admitting: Urology

## 2018-11-11 ENCOUNTER — Encounter: Payer: Self-pay | Admitting: Urology

## 2018-11-11 VITALS — BP 123/72 | HR 56 | Ht 69.0 in | Wt 155.0 lb

## 2018-11-11 DIAGNOSIS — N200 Calculus of kidney: Secondary | ICD-10-CM | POA: Diagnosis not present

## 2018-11-11 NOTE — Progress Notes (Signed)
11/11/2018 2:04 PM   Corey Perry 05/08/1946 [id number]  Referring provider: [name], MD [address] [place], [place] [address]  Chief Complaint  Patient presents with  . Follow-up    HPI:  [name] sun returns in evaluation of kidney stones. He developed left lower quadrant pain and flank pain August 18, 2019 radiating to the left groin. CT scan of the abdomen and pelvis with no stone of distal ureter revealed a 3 mm stone in the left distal ureter a centimeter or 2 from the left UVJ.  It was difficult to see the stone on the scout image.  There was an 8 mm left lower pole stone clearly visible on the scout image.  His pain resolved.  He follows up today with KUB which reveals the left lower pole stone, no ureteral stones.  Also, renal ultrasound shows resolution of hydronephrosis. He has chronic back pain.  He is on plavix for cardiac stents but can stop.   He had esophageal cancer and surgery and XRT and chemo in 2017. He passed a stone back in 2015 or 2016.   He typically voids with a good flow. He carries NTG but not had to take it.   He has an EGD coming up.    PMH: Past Medical History:  Diagnosis Date  . Abnormal white blood cell (WBC) count    seeing Dr. [name] at [facility] on 10/25  . Atrial fibrillation ([place])   . BPH (benign prostatic hypertrophy)   . Bradycardia   . CAD (coronary artery disease)   . Dysrhythmia    bradycardia - sees Dr. Humphrey Rolls  . Esophageal cancer ([place]) 05/2016  . GERD (gastroesophageal reflux disease)   . History of kidney stones   . Hyperlipidemia   . Hypertension   . Hypothyroidism   . Myocardial infarction ([place]) 2005  . S/P angioplasty with stent 2005   x 4 vessels  . Sleep apnea    mild-NO CPAP  . Spinal stenosis    lumbar    Surgical History: Past Surgical History:  Procedure Laterality Date  . BACK SURGERY    . CARDIAC CATHETERIZATION Left 03/12/2016   Procedure: Left Heart Cath and Coronary  Angiography;  Surgeon: [name], MD;  Location: [facility];  Service: Cardiovascular;  Laterality: Left;  . CARDIAC CATHETERIZATION N/A 03/12/2016   Procedure: Coronary Stent Intervention;  Surgeon: Yolonda Kida, MD;  Location: [facility];  Service: Cardiovascular;  Laterality: N/A;  . COLONOSCOPY WITH PROPOFOL N/A 07/13/2015   Procedure: COLONOSCOPY WITH PROPOFOL;  Surgeon: Lucilla Lame, MD;  Location: [facility];  Service: Endoscopy;  Laterality: N/A;  . CORONARY ANGIOPLASTY  10/05 and 12/05   4 DES placed  . ESOPHAGECTOMY  10/07/2016   @ [facility]  . ESOPHAGOGASTRODUODENOSCOPY (EGD) WITH PROPOFOL N/A 06/14/2016   Procedure: ESOPHAGOGASTRODUODENOSCOPY (EGD) WITH PROPOFOL;  Surgeon: [name], MD;  Location: [facility];  Service: Endoscopy;  Laterality: N/A;  . HERNIA REPAIR Bilateral    x3  . INGUINAL HERNIA REPAIR Right 08/28/2017   Large PerFix plug, recurrent hernia;  Surgeon: Robert Bellow, MD;  Location: [facility];  Service: General;  Laterality: Right;  recurrent  . POLYPECTOMY  07/13/2015   Procedure: POLYPECTOMY;  Surgeon: Lucilla Lame, MD;  Location: [facility];  Service: Endoscopy;;  . PORT-A-CATH REMOVAL N/A 01/16/2017   Procedure: REMOVAL PORT-A-CATH;  Surgeon: Olean Ree, MD;  Location: [facility];  Service: General;  Laterality: N/A;  . PORTACATH PLACEMENT N/A 07/08/2016   Procedure: INSERTION PORT-A-CATH;  Surgeon: Olean Ree, MD;  Location: [facility];  Service: General;  Laterality: N/A;    Home Medications:  Allergies as of 11/11/2018   No Known Allergies     Medication List       Accurate as of November 11, 2018  2:04 PM. Always use your most recent med list.        acetaminophen 500 MG tablet Commonly known as:  TYLENOL Take 500 mg by mouth every 6 (six) hours as needed for mild pain or moderate pain.   aspirin 81 MG tablet Take 81 mg by mouth daily. AM   atorvastatin 40 MG tablet Commonly known as:   LIPITOR Take 1 tablet (40 mg total) by mouth daily.   clopidogrel 75 MG tablet Commonly known as:  PLAVIX Take 1 tablet (75 mg total) by mouth daily.   HYDROcodone-acetaminophen 5-325 MG tablet Commonly known as:  NORCO Take 1 tablet by mouth every 4 (four) hours as needed for moderate pain.   isosorbide mononitrate 30 MG 24 hr tablet Commonly known as:  IMDUR Take 30 mg by mouth daily. Takes at night   levothyroxine 50 MCG tablet Commonly known as:  SYNTHROID, LEVOTHROID Take 1 tablet (50 mcg total) by mouth daily.   lisinopril 10 MG tablet Commonly known as:  PRINIVIL,ZESTRIL Take 1 tablet (10 mg total) by mouth every morning.   metoprolol succinate 25 MG 24 hr tablet Commonly known as:  TOPROL-XL Take 25 mg by mouth daily.   multivitamin-iron-minerals-folic acid chewable tablet Chew 1 tablet by mouth daily.   nitroGLYCERIN 0.4 MG SL tablet Commonly known as:  NITROSTAT Place 1 tablet under the tongue every 5 (five) minutes as needed for chest pain.   ondansetron 4 MG tablet Commonly known as:  ZOFRAN Take 1 tablet (4 mg total) by mouth daily as needed for nausea or vomiting.   pantoprazole 20 MG tablet Commonly known as:  PROTONIX Take 1 tablet (20 mg total) by mouth daily.   tamsulosin 0.4 MG Caps capsule Commonly known as:  FLOMAX Take 1 capsule (0.4 mg total) by mouth daily after supper.       Allergies: No Known Allergies  Family History: Family History  Problem Relation Age of Onset  . Hypertension Mother   . Dementia Mother   . Heart disease Father   . Hypertension Father   . Kidney disease Father   . Hypertension Brother   . Heart disease Brother   . Diabetes Son   . Hypertension Son   . Hyperlipidemia Son   . Hyperlipidemia Daughter   . Hypertension Daughter     Social History:  reports that he quit smoking about 14 years ago. His smoking use included cigarettes. He has a 40.00 pack-year smoking history. He has quit using smokeless  tobacco.  His smokeless tobacco use included chew. He reports that he does not drink alcohol or use drugs.  ROS:                                        Physical Exam: BP 123/72   Pulse (!) 56   Ht 5\' 9"  (1.753 m)   Wt 70.3 kg   BMI 22.89 kg/m   Constitutional:  Alert and oriented, No acute distress. HEENT: [place] AT, moist mucus membranes.  Trachea midline, no masses.  Cardiovascular: No clubbing, cyanosis, or edema. Respiratory: Normal respiratory effort, no increased work of breathing. GI: Abdomen is soft, nontender, nondistended, no abdominal masses GU: No CVA tenderness Skin: No rashes, bruises or suspicious lesions. Neurologic: Grossly intact, no focal deficits, moving all 4 extremities. Psychiatric: Normal mood and affect.  Laboratory Data: Lab Results  Component Value Date   WBC 4.8 10/18/2018   HGB 13.6 10/18/2018   HCT 40.9 10/18/2018   MCV 99.8 10/18/2018   PLT 113 (L) 10/18/2018    Lab Results  Component Value Date   CREATININE 1.43 (H) 10/18/2018    No results found for: PSA  No results found for: TESTOSTERONE  No results found for: HGBA1C  Urinalysis    Component Value Date/Time   COLORURINE STRAW (A) 10/18/2018 0647   APPEARANCEUR Clear 10/28/2018 0946   LABSPEC 1.010 10/18/2018 0647   PHURINE 5.0 10/18/2018 0647   GLUCOSEU Negative 10/28/2018 0946   HGBUR SMALL (A) 10/18/2018 0647   BILIRUBINUR Negative 10/28/2018 [place] 10/18/2018 0647   PROTEINUR Negative 10/28/2018 0946   PROTEINUR NEGATIVE 10/18/2018 0647   NITRITE Negative 10/28/2018 0946   NITRITE NEGATIVE 10/18/2018 0647   LEUKOCYTESUR Negative 10/28/2018 0946    Lab Results  Component Value Date   LABMICR See below: 10/28/2018   WBCUA 0-5 10/28/2018   RBCUA 3-10 (A) 10/28/2018   LABEPIT None seen 10/28/2018   MUCUS Present 06/21/2015   BACTERIA None seen 10/28/2018    Pertinent Imaging: KUB, [place] and CT - reviewed images  Results for  orders placed during the hospital encounter of 11/06/18  Abdomen 1 view (KUB)   Narrative CLINICAL DATA:  History of left flank pain. No current symptoms. Left renal stone identified on recent CT.  EXAM: ABDOMEN - 1 VIEW  COMPARISON:  CT abdomen and pelvis 10/18/2018 and 12/21/2016.  FINDINGS: 0.8 cm calcification projecting over the lower pole of the left kidney correlates with the nonobstructing stone seen on prior CT. 0.3 cm distal left ureteral stone seen on prior CT is not visible on this examination. There are some atherosclerotic calcifications in the pelvis. Sclerosis of the femoral heads bilaterally consistent with avascular necrosis is noted.  IMPRESSION: 0.3 cm distal left ureteral stone seen on the patient's recent CT scan is not visible on this examination. It could have passed or may be obscured by gas and stool.  0.8 cm nonobstructing stone lower pole left kidney.  Avascular necrosis of the femoral heads bilaterally.  Atherosclerosis.   Electronically Signed   By: Inge Rise M.D.   On: 11/06/2018 10:57    No results found for this or any previous visit. No results found for this or any previous visit. No results found for this or any previous visit. Results for orders placed during the hospital encounter of 11/06/18  US RENAL   Narrative CLINICAL DATA:  History of renal ureteral stone disease.  EXAM: RENAL / URINARY TRACT ULTRASOUND COMPLETE  COMPARISON:  Abdomen series 11/06/2018.  CT 10/18/2018.  FINDINGS: Right Kidney:  Renal measurements: 10.0 x 5.1 x 5.6 cm = volume: 148 mL . Echogenicity within normal limits. No mass or hydronephrosis visualized.  Left Kidney:  Renal measurements: 10.9 x 4.4 x 5.1 cm = volume: 129 mL. Echogenicity within normal limits. No mass or hydronephrosis visualized. 9 mm renal stone again noted. 1.7 cm simple cyst again noted.  Bladder:  Appears normal for degree of bladder distention. Slightly  prominent prostate.  IMPRESSION: 1. No evidence of  hydronephrosis noted on today's exam. Previous identified left hydronephrosis has cleared.  2. Persistent nephrolithiasis on the left noted. Simple cyst on the left again noted. No interim change.  3.  Slightly prominent prostate.  No bladder distention.   Electronically Signed   By: Marcello Moores  Register   On: 11/06/2018 13:43    No results found for this or any previous visit. No results found for this or any previous visit. Results for orders placed during the hospital encounter of 10/18/18  CT Renal Stone Study   Narrative CLINICAL DATA:  Acute left flank pain.  EXAM: CT ABDOMEN AND PELVIS WITHOUT CONTRAST  TECHNIQUE: Multidetector CT imaging of the abdomen and pelvis was performed following the standard protocol without IV contrast.  COMPARISON:  CT scan of December 21, 2016.  FINDINGS: Lower chest: Large sliding-type hiatal hernia is noted. Visualized lung bases are unremarkable.  Hepatobiliary: No focal liver abnormality is seen. No gallstones, gallbladder wall thickening, or biliary dilatation.  Pancreas: Unremarkable. No pancreatic ductal dilatation or surrounding inflammatory changes.  Spleen: Normal in size without focal abnormality.  Adrenals/Urinary Tract: Adrenal glands appear normal. Right kidney and ureter are unremarkable. 1 cm nonobstructive calculus is noted in lower pole calyx of left kidney. Mild left hydronephrosis is noted secondary to 3 mm calculus in distal left ureter. Urinary bladder is unremarkable.  Stomach/Bowel: Stomach is within normal limits. Appendix appears normal. No evidence of bowel wall thickening, distention, or inflammatory changes. Sigmoid diverticulosis is noted without inflammation.  Vascular/Lymphatic: Atherosclerosis of abdominal aorta is noted. 3.2 cm infrarenal abdominal aortic aneurysm is noted. No adenopathy is noted.  Reproductive: Prostate is  unremarkable.  Other: No abdominal wall hernia or abnormality. No abdominopelvic ascites.  Musculoskeletal: No acute or significant osseous findings.  IMPRESSION: Mild left hydroureteronephrosis is noted secondary to 3 mm calculus in distal left ureter. 1 cm nonobstructive calculus seen in left kidney.  3.2 cm infrarenal abdominal aortic aneurysm. Recommend followup by ultrasound in 3 years. This recommendation follows ACR consensus guidelines: White Paper of the ACR Incidental Findings Committee II on Vascular Findings. J Am Coll Radiol 2013; 10:789-794  Large sliding-type hiatal hernia.  Sigmoid diverticulosis without inflammation.  Aortic Atherosclerosis (ICD10-I70.0).   Electronically Signed   By: Marijo Conception, M.D.   On: 10/18/2018 09:33     Assessment & Plan:    Renal stone - I reviewed KUB images with pt and his wife. We discussed the nature r/b of surveillance. ESWL or URS. He will continue surveillance.   No follow-ups on file.  Festus Aloe, MD  [facility] [address], [place], [place] [address] [phone]

## 2018-11-11 NOTE — Patient Instructions (Signed)

## 2018-11-13 DIAGNOSIS — I2510 Atherosclerotic heart disease of native coronary artery without angina pectoris: Secondary | ICD-10-CM | POA: Diagnosis not present

## 2018-11-13 DIAGNOSIS — K9189 Other postprocedural complications and disorders of digestive system: Secondary | ICD-10-CM | POA: Diagnosis not present

## 2018-11-13 DIAGNOSIS — K311 Adult hypertrophic pyloric stenosis: Secondary | ICD-10-CM | POA: Diagnosis not present

## 2018-11-13 DIAGNOSIS — C155 Malignant neoplasm of lower third of esophagus: Secondary | ICD-10-CM | POA: Diagnosis not present

## 2018-11-13 DIAGNOSIS — Z955 Presence of coronary angioplasty implant and graft: Secondary | ICD-10-CM | POA: Diagnosis not present

## 2018-11-13 DIAGNOSIS — N400 Enlarged prostate without lower urinary tract symptoms: Secondary | ICD-10-CM | POA: Diagnosis not present

## 2018-11-13 DIAGNOSIS — Z87891 Personal history of nicotine dependence: Secondary | ICD-10-CM | POA: Diagnosis not present

## 2018-11-13 DIAGNOSIS — D696 Thrombocytopenia, unspecified: Secondary | ICD-10-CM | POA: Diagnosis not present

## 2018-11-13 DIAGNOSIS — Z8501 Personal history of malignant neoplasm of esophagus: Secondary | ICD-10-CM | POA: Diagnosis not present

## 2018-11-13 DIAGNOSIS — C159 Malignant neoplasm of esophagus, unspecified: Secondary | ICD-10-CM | POA: Diagnosis not present

## 2018-11-13 DIAGNOSIS — K209 Esophagitis, unspecified: Secondary | ICD-10-CM | POA: Diagnosis not present

## 2018-11-13 DIAGNOSIS — E039 Hypothyroidism, unspecified: Secondary | ICD-10-CM | POA: Diagnosis not present

## 2018-11-13 DIAGNOSIS — Z9221 Personal history of antineoplastic chemotherapy: Secondary | ICD-10-CM | POA: Diagnosis not present

## 2018-11-13 DIAGNOSIS — I129 Hypertensive chronic kidney disease with stage 1 through stage 4 chronic kidney disease, or unspecified chronic kidney disease: Secondary | ICD-10-CM | POA: Diagnosis not present

## 2018-11-13 DIAGNOSIS — Z7982 Long term (current) use of aspirin: Secondary | ICD-10-CM | POA: Diagnosis not present

## 2018-11-13 DIAGNOSIS — Z9049 Acquired absence of other specified parts of digestive tract: Secondary | ICD-10-CM | POA: Diagnosis not present

## 2018-11-13 DIAGNOSIS — N183 Chronic kidney disease, stage 3 (moderate): Secondary | ICD-10-CM | POA: Diagnosis not present

## 2018-11-13 DIAGNOSIS — Z79899 Other long term (current) drug therapy: Secondary | ICD-10-CM | POA: Diagnosis not present

## 2018-11-13 DIAGNOSIS — Z923 Personal history of irradiation: Secondary | ICD-10-CM | POA: Diagnosis not present

## 2018-11-13 DIAGNOSIS — K219 Gastro-esophageal reflux disease without esophagitis: Secondary | ICD-10-CM | POA: Diagnosis not present

## 2019-01-18 DIAGNOSIS — E785 Hyperlipidemia, unspecified: Secondary | ICD-10-CM | POA: Diagnosis not present

## 2019-01-18 DIAGNOSIS — Z9861 Coronary angioplasty status: Secondary | ICD-10-CM | POA: Diagnosis not present

## 2019-01-18 DIAGNOSIS — I10 Essential (primary) hypertension: Secondary | ICD-10-CM | POA: Diagnosis not present

## 2019-01-18 DIAGNOSIS — R0602 Shortness of breath: Secondary | ICD-10-CM | POA: Diagnosis not present

## 2019-01-18 DIAGNOSIS — I2510 Atherosclerotic heart disease of native coronary artery without angina pectoris: Secondary | ICD-10-CM | POA: Diagnosis not present

## 2019-03-01 ENCOUNTER — Telehealth: Payer: Self-pay | Admitting: Family Medicine

## 2019-03-01 NOTE — Chronic Care Management (AMB) (Signed)
Chronic Care Management   Note  03/01/2019 Name: Corey Perry MRN: [id number] DOB: [birth year]  Corey Perry is a 73 y.o. year old male who is a primary care patient of Crissman, Jeannette How, MD. I reached out to [name] by phone today in response to a referral sent by Corey Perry.    Corey Perry was given information about Chronic Care Management services today including:  1. CCM service includes personalized support from designated clinical staff supervised by his physician, including individualized Perry of care and coordination with other care providers 2. 24/7 contact phone numbers for assistance for urgent and routine care needs. 3. Service will only be billed when office clinical staff spend 20 minutes or more in a month to coordinate care. 4. Only one practitioner may furnish and bill the service in a calendar month. 5. The patient may stop CCM services at any time (effective at the end of the month) by phone call to the office staff. 6. The patient will be responsible for cost sharing (co-pay) of up to 20% of the service fee (after annual deductible is met).  Patient did not agree to enrollment in care management services and does not wish to consider at this time.  Follow up Perry: The patient has been provided with contact information for the chronic care management team and has been advised to call with any health related questions or concerns.   [place]  [email or web address]   ??[phone]

## 2019-03-03 ENCOUNTER — Ambulatory Visit: Payer: Medicare Other | Admitting: Family Medicine

## 2019-03-04 ENCOUNTER — Encounter: Payer: Self-pay | Admitting: Family Medicine

## 2019-03-04 ENCOUNTER — Ambulatory Visit (INDEPENDENT_AMBULATORY_CARE_PROVIDER_SITE_OTHER): Payer: Medicare Other | Admitting: Family Medicine

## 2019-03-04 ENCOUNTER — Other Ambulatory Visit: Payer: Self-pay

## 2019-03-04 DIAGNOSIS — I4891 Unspecified atrial fibrillation: Secondary | ICD-10-CM | POA: Insufficient documentation

## 2019-03-04 DIAGNOSIS — I2583 Coronary atherosclerosis due to lipid rich plaque: Secondary | ICD-10-CM | POA: Diagnosis not present

## 2019-03-04 DIAGNOSIS — D692 Other nonthrombocytopenic purpura: Secondary | ICD-10-CM

## 2019-03-04 DIAGNOSIS — C155 Malignant neoplasm of lower third of esophagus: Secondary | ICD-10-CM | POA: Diagnosis not present

## 2019-03-04 DIAGNOSIS — I10 Essential (primary) hypertension: Secondary | ICD-10-CM

## 2019-03-04 DIAGNOSIS — I2510 Atherosclerotic heart disease of native coronary artery without angina pectoris: Secondary | ICD-10-CM | POA: Diagnosis not present

## 2019-03-04 NOTE — Assessment & Plan Note (Signed)
Stable reports from Dr. Yancey Flemings

## 2019-03-04 NOTE — Assessment & Plan Note (Signed)
The current medical regimen is effective;  continue present plan and medications.  

## 2019-03-04 NOTE — Progress Notes (Signed)
There were no vitals taken for this visit.   Subjective:    Patient ID: Corey Perry, male    DOB: [birth year], 73 y.o.   MRN: [id number]  HPI: Corey Perry is a 73 y.o. male  Med check  Discussed with patient doing well had good report from Dr. Yancey Flemings. Blood pressure no complaints cholesterol no complaints thyroid doing well with no complaints. Reviewed labs thrombocytopenia has been stable will check CBC again  Relevant past medical, surgical, family and social history reviewed and updated as indicated. Interim medical history since our last visit reviewed. Allergies and medications reviewed and updated.  Review of Systems  Constitutional: Negative.   Respiratory: Negative.   Cardiovascular: Negative.     Per HPI unless specifically indicated above     Objective:    There were no vitals taken for this visit.  Wt Readings from Last 3 Encounters:  11/11/18 155 lb (70.3 kg)  10/28/18 155 lb 9.6 oz (70.6 kg)  10/18/18 155 lb (70.3 kg)    Physical Exam  Results for orders placed or performed in visit on 10/28/18  Microscopic Examination   URINE  Result Value Ref Range   WBC, UA 0-5 0 - 5 /hpf   RBC, UA 3-10 (A) 0 - 2 /hpf   Epithelial Cells (non renal) None seen 0 - 10 /hpf   Bacteria, UA None seen None seen/Few  Urinalysis, Complete  Result Value Ref Range   Specific Gravity, UA >1.030 (H) 1.005 - 1.030   pH, UA 5.0 5.0 - 7.5   Color, UA Yellow Yellow   Appearance Ur Clear Clear   Leukocytes, UA Negative Negative   Protein, UA Negative Negative/Trace   Glucose, UA Negative Negative   Ketones, UA Negative Negative   RBC, UA 2+ (A) Negative   Bilirubin, UA Negative Negative   Urobilinogen, Ur 0.2 0.2 - 1.0 mg/dL   Nitrite, UA Negative Negative   Microscopic Examination See below:       Assessment & Plan:   Problem List Items Addressed This Visit      Cardiovascular and Mediastinum   Hypertension    The current medical regimen is effective;   continue present plan and medications.       Relevant Orders   Basic metabolic panel   CAD (coronary artery disease)    The current medical regimen is effective;  continue present plan and medications.       Relevant Orders   Basic metabolic panel   LP+ALT+AST Piccolo, Waived   Senile purpura ([place])    The current medical regimen is effective;  continue present plan and medications.       Atrial fibrillation ([place])    Stable reports from Dr. Yancey Flemings      Relevant Orders   LP+ALT+AST Piccolo, Waived   CBC with Differential/Platelet     Digestive   Esophageal cancer [facility])    The current medical regimen is effective;  continue present plan and medications.       Relevant Orders   CBC with Differential/Platelet       Telemedicine using audio/video telecommunications for a synchronous communication visit. Today's visit due to COVID-19 isolation precautions I connected with and verified that I am speaking with the correct person using two identifiers.   I discussed the limitations, risks, security and privacy concerns of performing an evaluation and management service by telecommunication and the availability of in person appointments. I also discussed with the patient that there may  be a patient responsible charge related to this service. The patient expressed understanding and agreed to proceed. The patients location is home. I am at home.   I discussed the assessment and treatment plan with the patient. The patient was provided an opportunity to ask questions and all were answered. The patient agreed with the plan and demonstrated an understanding of the instructions.   The patient was advised to call back or seek an in-person evaluation if the symptoms worsen or if the condition fails to improve as anticipated.   I provided 21+ minutes of time during this encounter.  Follow up plan: Return in about 6 months (around 09/03/2019) for Physical Exam.

## 2019-03-11 DIAGNOSIS — K769 Liver disease, unspecified: Secondary | ICD-10-CM | POA: Diagnosis not present

## 2019-03-11 DIAGNOSIS — R590 Localized enlarged lymph nodes: Secondary | ICD-10-CM | POA: Diagnosis not present

## 2019-03-11 DIAGNOSIS — C159 Malignant neoplasm of esophagus, unspecified: Secondary | ICD-10-CM | POA: Diagnosis not present

## 2019-03-11 DIAGNOSIS — K311 Adult hypertrophic pyloric stenosis: Secondary | ICD-10-CM | POA: Diagnosis not present

## 2019-03-11 DIAGNOSIS — C155 Malignant neoplasm of lower third of esophagus: Secondary | ICD-10-CM | POA: Diagnosis not present

## 2019-03-11 DIAGNOSIS — Z87891 Personal history of nicotine dependence: Secondary | ICD-10-CM | POA: Diagnosis not present

## 2019-03-15 ENCOUNTER — Other Ambulatory Visit: Payer: Self-pay

## 2019-03-15 ENCOUNTER — Other Ambulatory Visit: Payer: Medicare Other

## 2019-03-15 DIAGNOSIS — I2510 Atherosclerotic heart disease of native coronary artery without angina pectoris: Secondary | ICD-10-CM | POA: Diagnosis not present

## 2019-03-15 DIAGNOSIS — C155 Malignant neoplasm of lower third of esophagus: Secondary | ICD-10-CM | POA: Diagnosis not present

## 2019-03-15 DIAGNOSIS — I4891 Unspecified atrial fibrillation: Secondary | ICD-10-CM

## 2019-03-15 DIAGNOSIS — I10 Essential (primary) hypertension: Secondary | ICD-10-CM | POA: Diagnosis not present

## 2019-03-15 DIAGNOSIS — I2583 Coronary atherosclerosis due to lipid rich plaque: Secondary | ICD-10-CM | POA: Diagnosis not present

## 2019-03-15 LAB — LP+ALT+AST PICCOLO, WAIVED
ALT (SGPT) Piccolo, Waived: 28 U/L (ref 10–47)
AST (SGOT) Piccolo, Waived: 31 U/L (ref 11–38)
Chol/HDL Ratio Piccolo,Waive: 2.5 mg/dL
Cholesterol Piccolo, Waived: 114 mg/dL (ref <200)
HDL Chol Piccolo, Waived: 45 mg/dL — ABNORMAL LOW (ref >59)
LDL Chol Calc Piccolo Waived: 50 mg/dL (ref <100)
Triglycerides Piccolo,Waived: 99 mg/dL (ref <150)
VLDL Chol Calc Piccolo,Waive: 20 mg/dL (ref <30)

## 2019-03-16 ENCOUNTER — Encounter: Payer: Self-pay | Admitting: Family Medicine

## 2019-03-16 LAB — CBC WITH DIFFERENTIAL/PLATELET
Basophils Absolute: 0 10*3/uL (ref 0.0–0.2)
Basos: 1 %
EOS (ABSOLUTE): 0.1 10*3/uL (ref 0.0–0.4)
Eos: 3 %
Hematocrit: 39.2 % (ref 37.5–51.0)
Hemoglobin: 13 g/dL (ref 13.0–17.7)
Immature Grans (Abs): 0 10*3/uL (ref 0.0–0.1)
Immature Granulocytes: 1 %
Lymphocytes Absolute: 0.6 10*3/uL — ABNORMAL LOW (ref 0.7–3.1)
Lymphs: 15 %
MCH: 32.6 pg (ref 26.6–33.0)
MCHC: 33.2 g/dL (ref 31.5–35.7)
MCV: 98 fL — ABNORMAL HIGH (ref 79–97)
Monocytes Absolute: 0.3 10*3/uL (ref 0.1–0.9)
Monocytes: 8 %
Neutrophils Absolute: 2.7 10*3/uL (ref 1.4–7.0)
Neutrophils: 72 %
Platelets: 130 10*3/uL — ABNORMAL LOW (ref 150–450)
RBC: 3.99 x10E6/uL — ABNORMAL LOW (ref 4.14–5.80)
RDW: 12.3 % (ref 11.6–15.4)
WBC: 3.7 10*3/uL (ref 3.4–10.8)

## 2019-03-16 LAB — BASIC METABOLIC PANEL
BUN/Creatinine Ratio: 19 (ref 10–24)
BUN: 23 mg/dL (ref 8–27)
CO2: 27 mmol/L (ref 20–29)
Calcium: 9.1 mg/dL (ref 8.6–10.2)
Chloride: 102 mmol/L (ref 96–106)
Creatinine, Ser: 1.24 mg/dL (ref 0.76–1.27)
GFR calc Af Amer: 67 mL/min/{1.73_m2} (ref >59)
GFR calc non Af Amer: 58 mL/min/{1.73_m2} — ABNORMAL LOW (ref >59)
Glucose: 108 mg/dL — ABNORMAL HIGH (ref 65–99)
Potassium: 4.6 mmol/L (ref 3.5–5.2)
Sodium: 141 mmol/L (ref 134–144)

## 2019-03-26 ENCOUNTER — Telehealth: Payer: Self-pay | Admitting: Urology

## 2019-03-26 DIAGNOSIS — N200 Calculus of kidney: Secondary | ICD-10-CM

## 2019-03-26 NOTE — Telephone Encounter (Signed)
Pt's wife called and states that the pt is having back and and he feels like it may be another kidney stone, he denies any other symptoms. Please advise.

## 2019-03-31 NOTE — Telephone Encounter (Signed)
I called pt and he states he can not come today, so I added to tomorrow's  Nurse schedule.

## 2019-03-31 NOTE — Telephone Encounter (Signed)
Please call this pt and have him added to the nurse schedule with KUB first today

## 2019-03-31 NOTE — Addendum Note (Signed)
Addended by: Donalee Citrin on: 03/31/2019 10:12 AM   Modules accepted: Orders

## 2019-04-01 ENCOUNTER — Ambulatory Visit
Admission: RE | Admit: 2019-04-01 | Discharge: 2019-04-01 | Disposition: A | Discharge status: Home / Self Care | Payer: Medicare Other | Source: Ambulatory Visit | Attending: Urology | Admitting: Urology

## 2019-04-01 ENCOUNTER — Ambulatory Visit (INDEPENDENT_AMBULATORY_CARE_PROVIDER_SITE_OTHER): Payer: Medicare Other | Admitting: Physician Assistant

## 2019-04-01 ENCOUNTER — Other Ambulatory Visit: Payer: Self-pay

## 2019-04-01 VITALS — BP 150/84 | HR 54 | Ht 69.0 in | Wt 152.0 lb

## 2019-04-01 DIAGNOSIS — N200 Calculus of kidney: Secondary | ICD-10-CM

## 2019-04-01 DIAGNOSIS — R109 Unspecified abdominal pain: Secondary | ICD-10-CM | POA: Diagnosis not present

## 2019-04-01 DIAGNOSIS — I2583 Coronary atherosclerosis due to lipid rich plaque: Secondary | ICD-10-CM

## 2019-04-01 DIAGNOSIS — R310 Gross hematuria: Secondary | ICD-10-CM | POA: Diagnosis not present

## 2019-04-01 DIAGNOSIS — I2510 Atherosclerotic heart disease of native coronary artery without angina pectoris: Secondary | ICD-10-CM

## 2019-04-01 LAB — URINALYSIS, COMPLETE
Bilirubin, UA: NEGATIVE
Glucose, UA: NEGATIVE
Ketones, UA: NEGATIVE
Leukocytes,UA: NEGATIVE
Nitrite, UA: NEGATIVE
Protein,UA: NEGATIVE
Specific Gravity, UA: 1.015 (ref 1.005–1.030)
Urobilinogen, Ur: 0.2 mg/dL (ref 0.2–1.0)
pH, UA: 5.5 (ref 5.0–7.5)

## 2019-04-01 LAB — MICROSCOPIC EXAMINATION
Bacteria, UA: NONE SEEN
RBC, Urine: >30 /hpf — AB (ref 0–2)

## 2019-04-01 NOTE — Progress Notes (Signed)
04/01/2019 8:49 AM   Corey Perry 1946/06/04 [id number]  CC: Right flank pain  HPI: Corey Perry is a 73 y.o. male who presents to the clinic for evaluation of possible acute stone episode. He is an established [facility] patient who last saw Dr. Junious Silk on 11/11/2018 for kidney stone management. CT at that time showed one 80mm nonobstructing left renal stone and a 24mm distal left ureteral stone with mild left hydronephrosis. No stones were visualized on the right side at that time and the patient reports spontaneously passing a stone since he was last seen.  Today, patient reports a 58-month history of intermittent, sudden onset, severe right flank pain. Pain episodes last 30-45 minutes. He states the pain does not radiate and has not moved since onset. KUB today with no stones visualized on the right side. Of note, patient has a history of lumbar stenosis requiring surgery.  Patient also notes an approximate 50-month history of intermittent "rusty" and "dark" colored urine, most recently noticed yesterday. He has been working outside this summer but does his best to stay hydrated with water and lemonade, per Dr. Lyndal Rainbow recommendation. Of note, patient has a 40 pack/year smoking history and quit in 2005. UA in office was positive for >30 RBCs and otherwise negative. Additionally, patient has a PMH of CKD; most recent creatinine was 1.24 on 03/15/2019, which is within his typical range.  PMH: Past Medical History:  Diagnosis Date  . Abnormal white blood cell (WBC) count    seeing Dr. [name] at [facility] on 10/25  . Atrial fibrillation ([place])   . BPH (benign prostatic hypertrophy)   . Bradycardia   . CAD (coronary artery disease)   . Dysrhythmia    bradycardia - sees Dr. Humphrey Rolls  . Esophageal cancer ([place]) 05/2016  . GERD (gastroesophageal reflux disease)   . History of kidney stones   . Hyperlipidemia   . Hypertension   . Hypothyroidism   . Myocardial infarction ([place]) 2005  . S/P  angioplasty with stent 2005   x 4 vessels  . Sleep apnea    mild-NO CPAP  . Spinal stenosis    lumbar   Surgical History: Past Surgical History:  Procedure Laterality Date  . BACK SURGERY    . CARDIAC CATHETERIZATION Left 03/12/2016   Procedure: Left Heart Cath and Coronary Angiography;  Surgeon: [name], MD;  Location: [facility];  Service: Cardiovascular;  Laterality: Left;  . CARDIAC CATHETERIZATION N/A 03/12/2016   Procedure: Coronary Stent Intervention;  Surgeon: Yolonda Kida, MD;  Location: [facility];  Service: Cardiovascular;  Laterality: N/A;  . COLONOSCOPY WITH PROPOFOL N/A 07/13/2015   Procedure: COLONOSCOPY WITH PROPOFOL;  Surgeon: Lucilla Lame, MD;  Location: [facility];  Service: Endoscopy;  Laterality: N/A;  . CORONARY ANGIOPLASTY  10/05 and 12/05   4 DES placed  . ESOPHAGECTOMY  10/07/2016   @ [facility]  . ESOPHAGOGASTRODUODENOSCOPY (EGD) WITH PROPOFOL N/A 06/14/2016   Procedure: ESOPHAGOGASTRODUODENOSCOPY (EGD) WITH PROPOFOL;  Surgeon: [name], MD;  Location: [facility];  Service: Endoscopy;  Laterality: N/A;  . HERNIA REPAIR Bilateral    x3  . INGUINAL HERNIA REPAIR Right 08/28/2017   Large PerFix plug, recurrent hernia;  Surgeon: Robert Bellow, MD;  Location: [facility];  Service: General;  Laterality: Right;  recurrent  . POLYPECTOMY  07/13/2015   Procedure: POLYPECTOMY;  Surgeon: Lucilla Lame, MD;  Location: [facility];  Service: Endoscopy;;  . PORT-A-CATH REMOVAL N/A  01/16/2017   Procedure: REMOVAL PORT-A-CATH;  Surgeon: Olean Ree, MD;  Location: [facility];  Service: General;  Laterality: N/A;  . PORTACATH PLACEMENT N/A 07/08/2016   Procedure: INSERTION PORT-A-CATH;  Surgeon: Olean Ree, MD;  Location: [facility];  Service: General;  Laterality: N/A;   Home Medications:   Current Outpatient Medications:  .  acetaminophen (TYLENOL) 500 MG tablet, Take 500 mg by mouth every 6 (six) hours as needed for  mild pain or moderate pain., Disp: , Rfl:  .  aspirin 81 MG tablet, Take 81 mg by mouth daily. AM, Disp: , Rfl:  .  atorvastatin (LIPITOR) 40 MG tablet, Take 1 tablet (40 mg total) by mouth daily., Disp: 90 tablet, Rfl: 4 .  clopidogrel (PLAVIX) 75 MG tablet, Take 1 tablet (75 mg total) by mouth daily., Disp: 90 tablet, Rfl: 2 .  HYDROcodone-acetaminophen (NORCO) 5-325 MG tablet, Take 1 tablet by mouth every 4 (four) hours as needed for moderate pain., Disp: 6 tablet, Rfl: 0 .  isosorbide mononitrate (IMDUR) 30 MG 24 hr tablet, Take 30 mg by mouth daily. Takes at night, Disp: , Rfl: 5 .  levothyroxine (SYNTHROID, LEVOTHROID) 50 MCG tablet, Take 1 tablet (50 mcg total) by mouth daily., Disp: 90 tablet, Rfl: 4 .  lisinopril (PRINIVIL,ZESTRIL) 10 MG tablet, Take 1 tablet (10 mg total) by mouth every morning., Disp: 90 tablet, Rfl: 4 .  metoprolol succinate (TOPROL-XL) 25 MG 24 hr tablet, Take 25 mg by mouth daily., Disp: , Rfl:  .  multivitamin-iron-minerals-folic acid (CENTRUM) chewable tablet, Chew 1 tablet by mouth daily., Disp: , Rfl:  .  nitroGLYCERIN (NITROSTAT) 0.4 MG SL tablet, Place 1 tablet under the tongue every 5 (five) minutes as needed for chest pain. , Disp: , Rfl: 98 .  ondansetron (ZOFRAN) 4 MG tablet, Take 1 tablet (4 mg total) by mouth daily as needed for nausea or vomiting., Disp: 14 tablet, Rfl: 0 .  pantoprazole (PROTONIX) 20 MG tablet, Take 1 tablet (20 mg total) by mouth daily., Disp: 90 tablet, Rfl: 4 .  tamsulosin (FLOMAX) 0.4 MG CAPS capsule, Take 1 capsule (0.4 mg total) by mouth daily after supper., Disp: 10 capsule, Rfl: 0   Allergies: No Known Allergies  Family History: Family History  Problem Relation Age of Onset  . Hypertension Mother   . Dementia Mother   . Heart disease Father   . Hypertension Father   . Kidney disease Father   . Hypertension Brother   . Heart disease Brother   . Diabetes Son   . Hypertension Son   . Hyperlipidemia Son   . Hyperlipidemia  Daughter   . Hypertension Daughter     Social History:  reports that he quit smoking about 14 years ago. His smoking use included cigarettes. He has a 40.00 pack-year smoking history. He has quit using smokeless tobacco.  His smokeless tobacco use included chew. He reports that he does not drink alcohol or use drugs.  ROS: Reviewed; noncontributory except for what was mentioned above.  Physical Exam: BP (!) 150/84   Pulse (!) 54   Ht 5\' 9"  (1.753 m)   Wt 152 lb (68.9 kg)   BMI 22.45 kg/m   Constitutional:  Alert and oriented, No acute distress. HEENT: [place] AT, moist mucus membranes.  Trachea midline, no masses. Cardiovascular: No clubbing, cyanosis, or edema. Respiratory: Normal respiratory effort, no increased work of breathing. GU: No CVA tenderness Skin: No rashes, bruises or suspicious lesions. MSK: No point tenderness over the  flanks or spinous processes. Neurologic: Grossly intact, no focal deficits, moving all 4 extremities. Psychiatric: Normal mood and affect.  Laboratory Data:  Lab Results  Component Value Date   CREATININE 1.24 03/15/2019   Urinalysis    Component Value Date/Time   COLORURINE STRAW (A) 10/18/2018 0647   APPEARANCEUR Cloudy (A) 04/01/2019 1509   LABSPEC 1.010 10/18/2018 0647   PHURINE 5.0 10/18/2018 0647   GLUCOSEU Negative 04/01/2019 1509   HGBUR SMALL (A) 10/18/2018 0647   BILIRUBINUR Negative 04/01/2019 [place] 10/18/2018 0647   PROTEINUR Negative 04/01/2019 1509   PROTEINUR NEGATIVE 10/18/2018 0647   NITRITE Negative 04/01/2019 1509   NITRITE NEGATIVE 10/18/2018 0647   LEUKOCYTESUR Negative 04/01/2019 1509   Results for orders placed or performed in visit on 04/01/19  Microscopic Examination     Status: Abnormal   Collection Time: 04/01/19  3:09 PM   URINE  Result Value Ref Range Status   WBC, UA 0-5 0 - 5 /hpf Final   RBC >30 (A) 0 - 2 /hpf Final   Epithelial Cells (non renal) 0-10 0 - 10 /hpf Final   Bacteria, UA  None seen None seen/Few Final   Pertinent Imaging:  KUB, 04/01/2019 CLINICAL DATA:  Follow-up nephrolithiasis.  EXAM: ABDOMEN - 1 VIEW  COMPARISON:  Renal ultrasound dated 11/06/2018 and abdomen and pelvis CT dated 10/18/2018. Abdomen radiograph dated 11/06/2018.  FINDINGS: A normal bowel gas pattern is again demonstrated. A left renal calculus is also again demonstrated, measuring 9 mm. No other urinary tract calculi are visible. Mild atheromatous arterial calcifications. Lumbar spine degenerative changes.  IMPRESSION: 9 mm left renal calculus.   Electronically Signed   By: Claudie Revering M.D.   On: 04/02/2019 08:31   Results for orders placed during the hospital encounter of 10/18/18  CT Renal Stone Study   Narrative CLINICAL DATA:  Acute left flank pain.  EXAM: CT ABDOMEN AND PELVIS WITHOUT CONTRAST  TECHNIQUE: Multidetector CT imaging of the abdomen and pelvis was performed following the standard protocol without IV contrast.  COMPARISON:  CT scan of December 21, 2016.  FINDINGS: Lower chest: Large sliding-type hiatal hernia is noted. Visualized lung bases are unremarkable.  Hepatobiliary: No focal liver abnormality is seen. No gallstones, gallbladder wall thickening, or biliary dilatation.  Pancreas: Unremarkable. No pancreatic ductal dilatation or surrounding inflammatory changes.  Spleen: Normal in size without focal abnormality.  Adrenals/Urinary Tract: Adrenal glands appear normal. Right kidney and ureter are unremarkable. 1 cm nonobstructive calculus is noted in lower pole calyx of left kidney. Mild left hydronephrosis is noted secondary to 3 mm calculus in distal left ureter. Urinary bladder is unremarkable.  Stomach/Bowel: Stomach is within normal limits. Appendix appears normal. No evidence of bowel wall thickening, distention, or inflammatory changes. Sigmoid diverticulosis is noted without inflammation.  Vascular/Lymphatic: Atherosclerosis  of abdominal aorta is noted. 3.2 cm infrarenal abdominal aortic aneurysm is noted. No adenopathy is noted.  Reproductive: Prostate is unremarkable.  Other: No abdominal wall hernia or abnormality. No abdominopelvic ascites.  Musculoskeletal: No acute or significant osseous findings.  IMPRESSION: Mild left hydroureteronephrosis is noted secondary to 3 mm calculus in distal left ureter. 1 cm nonobstructive calculus seen in left kidney.  3.2 cm infrarenal abdominal aortic aneurysm. Recommend followup by ultrasound in 3 years. This recommendation follows ACR consensus guidelines: White Paper of the ACR Incidental Findings Committee II on Vascular Findings. J Am Coll Radiol 2013; 10:789-794  Large sliding-type hiatal hernia.  Sigmoid diverticulosis without inflammation.  Aortic Atherosclerosis (ICD10-I70.0).   Electronically Signed   By: Marijo Conception, M.D.   On: 10/18/2018 09:33    I personally reviewed the images referenced above.  Assessment & Plan:    1. Right flank pain Given patient's reported onset of pain four months ago and the absence of right sided stones on his February 2020 CT, my clinical suspicion is low for a stone etiology at this time. Patient reports being satisfied taking acetaminophen for management of these intermittent pain episodes. I reiterated to him that he should continue to avoid NSAIDs given his history of CKD. He expressed understanding of this plan. Differential at this time includes MSK pain given the localization of the pain and his PMH of spinal stenosis requiring surgery, or urinary tract pathology, to be discussed below. Less likely is rhabdomyolysis given his dark urine episodes and outside work this summer, however my clinical suspicion for this is very low given the visualization of RBCs on urine microscopy, the intermittent nature of the patient's reported hematuria, and the absence of a change in his creatinine since symptom onset.   2.  Hematuria Patient's smoking history and new onset of gross hematuria, coupled with >30 RBCs on UA today are concerning for possible urinary tract abnormalities. Hematuria workup is indicated at this time as he falls within the high risk category per recent AUA guidelines. Patient has CKD with most recent creatinine 1.24 on 03/15/2019 (will defer retesting today since this lab is <25 month old); he is therefore a candidate for IV contrast and should proceed with CT hematuria. I ordered this today. Patient was amenable to this, but hesitant to proceed with cystoscopy due to prior painful catheterizations. I counseled patient that this test is necessary given his smoking history and risk status. He reluctantly agreed to this plan. -CT hematuria -Add cystoscopy to scheduled visit with Dr. Junious Silk on 05/12/2019  Debroah Loop, PA-C  [place] [facility] [address] [place], [place] [address] [phone]

## 2019-04-07 ENCOUNTER — Telehealth: Payer: Self-pay | Admitting: Physician Assistant

## 2019-04-07 NOTE — Telephone Encounter (Signed)
Please contact patient to schedule CT hematuria prior to his scheduled follow-up with Dr. Junious Silk next month.

## 2019-04-07 NOTE — Telephone Encounter (Signed)
Scheduling will contact the patient to schedule.  Thanks, Sharyn Lull

## 2019-04-13 DIAGNOSIS — Z08 Encounter for follow-up examination after completed treatment for malignant neoplasm: Secondary | ICD-10-CM | POA: Diagnosis not present

## 2019-04-13 DIAGNOSIS — R590 Localized enlarged lymph nodes: Secondary | ICD-10-CM | POA: Diagnosis not present

## 2019-04-13 DIAGNOSIS — Z8501 Personal history of malignant neoplasm of esophagus: Secondary | ICD-10-CM | POA: Diagnosis not present

## 2019-04-13 DIAGNOSIS — K769 Liver disease, unspecified: Secondary | ICD-10-CM | POA: Diagnosis not present

## 2019-04-13 DIAGNOSIS — R918 Other nonspecific abnormal finding of lung field: Secondary | ICD-10-CM | POA: Diagnosis not present

## 2019-04-13 DIAGNOSIS — Z9889 Other specified postprocedural states: Secondary | ICD-10-CM | POA: Diagnosis not present

## 2019-04-15 DIAGNOSIS — C159 Malignant neoplasm of esophagus, unspecified: Secondary | ICD-10-CM | POA: Diagnosis not present

## 2019-04-22 ENCOUNTER — Ambulatory Visit
Admission: RE | Admit: 2019-04-22 | Discharge: 2019-04-22 | Disposition: A | Discharge status: Home / Self Care | Payer: Medicare Other | Source: Ambulatory Visit | Attending: Physician Assistant | Admitting: Physician Assistant

## 2019-04-22 ENCOUNTER — Other Ambulatory Visit: Payer: Self-pay

## 2019-04-22 DIAGNOSIS — N281 Cyst of kidney, acquired: Secondary | ICD-10-CM | POA: Diagnosis not present

## 2019-04-22 DIAGNOSIS — R310 Gross hematuria: Secondary | ICD-10-CM | POA: Insufficient documentation

## 2019-04-22 DIAGNOSIS — D490 Neoplasm of unspecified behavior of digestive system: Secondary | ICD-10-CM | POA: Diagnosis not present

## 2019-04-22 DIAGNOSIS — R188 Other ascites: Secondary | ICD-10-CM | POA: Diagnosis not present

## 2019-04-22 DIAGNOSIS — R161 Splenomegaly, not elsewhere classified: Secondary | ICD-10-CM | POA: Diagnosis not present

## 2019-04-22 MED ORDER — IOHEXOL 300 MG/ML  SOLN
125.0000 mL | Freq: Once | INTRAMUSCULAR | Status: AC | PRN
  Administered 2019-04-22: 10:00:00 125 mL via INTRAVENOUS

## 2019-04-28 ENCOUNTER — Ambulatory Visit: Payer: Medicare Other | Admitting: Oncology

## 2019-04-29 ENCOUNTER — Other Ambulatory Visit: Payer: Self-pay

## 2019-04-30 ENCOUNTER — Inpatient Hospital Stay: Payer: Medicare Other | Attending: Oncology | Admitting: Oncology

## 2019-04-30 ENCOUNTER — Encounter: Payer: Self-pay | Admitting: Oncology

## 2019-04-30 ENCOUNTER — Other Ambulatory Visit: Payer: Self-pay

## 2019-04-30 VITALS — BP 106/61 | HR 62 | Temp 95.4°F | Resp 18 | Wt 150.3 lb

## 2019-04-30 DIAGNOSIS — I2583 Coronary atherosclerosis due to lipid rich plaque: Secondary | ICD-10-CM | POA: Diagnosis not present

## 2019-04-30 DIAGNOSIS — N289 Disorder of kidney and ureter, unspecified: Secondary | ICD-10-CM | POA: Diagnosis not present

## 2019-04-30 DIAGNOSIS — Z87442 Personal history of urinary calculi: Secondary | ICD-10-CM | POA: Diagnosis not present

## 2019-04-30 DIAGNOSIS — Z833 Family history of diabetes mellitus: Secondary | ICD-10-CM | POA: Insufficient documentation

## 2019-04-30 DIAGNOSIS — D696 Thrombocytopenia, unspecified: Secondary | ICD-10-CM | POA: Insufficient documentation

## 2019-04-30 DIAGNOSIS — I2510 Atherosclerotic heart disease of native coronary artery without angina pectoris: Secondary | ICD-10-CM | POA: Insufficient documentation

## 2019-04-30 DIAGNOSIS — I700 Atherosclerosis of aorta: Secondary | ICD-10-CM | POA: Insufficient documentation

## 2019-04-30 DIAGNOSIS — N200 Calculus of kidney: Secondary | ICD-10-CM | POA: Insufficient documentation

## 2019-04-30 DIAGNOSIS — Z8249 Family history of ischemic heart disease and other diseases of the circulatory system: Secondary | ICD-10-CM | POA: Diagnosis not present

## 2019-04-30 DIAGNOSIS — Z87891 Personal history of nicotine dependence: Secondary | ICD-10-CM | POA: Insufficient documentation

## 2019-04-30 DIAGNOSIS — I4891 Unspecified atrial fibrillation: Secondary | ICD-10-CM | POA: Diagnosis not present

## 2019-04-30 DIAGNOSIS — Z83438 Family history of other disorder of lipoprotein metabolism and other lipidemia: Secondary | ICD-10-CM | POA: Insufficient documentation

## 2019-04-30 DIAGNOSIS — Z841 Family history of disorders of kidney and ureter: Secondary | ICD-10-CM | POA: Insufficient documentation

## 2019-04-30 DIAGNOSIS — N281 Cyst of kidney, acquired: Secondary | ICD-10-CM | POA: Diagnosis not present

## 2019-04-30 DIAGNOSIS — R590 Localized enlarged lymph nodes: Secondary | ICD-10-CM | POA: Insufficient documentation

## 2019-04-30 DIAGNOSIS — R188 Other ascites: Secondary | ICD-10-CM | POA: Diagnosis not present

## 2019-04-30 DIAGNOSIS — R351 Nocturia: Secondary | ICD-10-CM | POA: Diagnosis not present

## 2019-04-30 DIAGNOSIS — C155 Malignant neoplasm of lower third of esophagus: Secondary | ICD-10-CM | POA: Insufficient documentation

## 2019-04-30 DIAGNOSIS — R918 Other nonspecific abnormal finding of lung field: Secondary | ICD-10-CM | POA: Diagnosis not present

## 2019-04-30 DIAGNOSIS — Z79899 Other long term (current) drug therapy: Secondary | ICD-10-CM | POA: Diagnosis not present

## 2019-04-30 DIAGNOSIS — I252 Old myocardial infarction: Secondary | ICD-10-CM | POA: Diagnosis not present

## 2019-04-30 DIAGNOSIS — I10 Essential (primary) hypertension: Secondary | ICD-10-CM | POA: Diagnosis not present

## 2019-04-30 NOTE — Progress Notes (Signed)
 [facility]  [phone] [phone]  ID: Corey Perry OB: 03/01/1946  MR#: [id number]  CSN#:[id number]  Patient Care Team: Crissman, Mark A, MD as PCP - General (Family Medicine) Crissman, Mark A, MD as PCP - Family Medicine (Family Medicine) Finnegan, Timothy J, MD as Consulting Physician (Oncology) D'Amico, Thomas, MD as Referring Physician Khan, Shaukat A, MD as Consulting Physician (Cardiology)  CHIEF COMPLAINT: Stage IIb adenocarcinoma of the lower third of the esophagus.  INTERVAL HISTORY: Patient was last evaluated in clinic in July 2019.  Recently he was having evaluation for hematuria and CT scan and noted upper abdominal masses concerning for recurrence.  Patient subsequently had a PET scan completed [facility] on April 13, 2019 that revealed a soft tissue thickening along the proximal aspect of the anastomotic site with increased FDG activity with SUV of 5.1.  Patient also noted to have retroperitoneal lymphadenopathy with mild FDG activity with SUV of 6.1.  He currently feels well and is asymptomatic. He does not complain of dysphasia or difficulty swallowing today.  He has a good appetite and denies weight loss.  He denies any recent fevers or illnesses. He has no neurologic complaints.  He denies any chest pain, shortness of breath, cough, or hemoptysis.  He denies any nausea, vomiting, constipation, or diarrhea.  Patient feels at his baseline offers no specific complaints today.  REVIEW OF SYSTEMS:   Review of Systems  Constitutional: Negative.  Negative for fever, malaise/fatigue and weight loss.  HENT: Negative.   Respiratory: Negative.  Negative for cough and shortness of breath.   Cardiovascular: Negative.  Negative for chest pain and leg swelling.  Gastrointestinal: Negative.  Negative for abdominal pain, blood in stool, constipation, diarrhea, melena, nausea and vomiting.  Genitourinary: Negative.  Negative for frequency  and urgency.       Nocturia  Musculoskeletal: Negative.  Negative for back pain.  Skin: Negative.  Negative for rash.  Neurological: Negative.  Negative for tingling, focal weakness, weakness and headaches.  Psychiatric/Behavioral: Negative.  The patient is not nervous/anxious and does not have insomnia.     As per HPI. Otherwise, a complete review of systems is negative.  PAST MEDICAL HISTORY: Past Medical History:  Diagnosis Date  . Abnormal white blood cell (WBC) count    seeing Dr. Finnegan at [facility] on 10/25  . Atrial fibrillation (HCC)   . BPH (benign prostatic hypertrophy)   . Bradycardia   . CAD (coronary artery disease)   . Dysrhythmia    bradycardia - sees Dr. Khan  . Esophageal cancer (HCC) 05/2016  . GERD (gastroesophageal reflux disease)   . History of kidney stones   . Hyperlipidemia   . Hypertension   . Hypothyroidism   . Myocardial infarction (HCC) 2005  . S/P angioplasty with stent 2005   x 4 vessels  . Sleep apnea    mild-NO CPAP  . Spinal stenosis    lumbar    PAST SURGICAL HISTORY: Past Surgical History:  Procedure Laterality Date  . BACK SURGERY    . CARDIAC CATHETERIZATION Left 03/12/2016   Procedure: Left Heart Cath and Coronary Angiography;  Surgeon: Shaukat A Khan, MD;  Location: [facility];  Service: Cardiovascular;  Laterality: Left;  . CARDIAC CATHETERIZATION N/A 03/12/2016   Procedure: Coronary Stent Intervention;  Surgeon: Dwayne D Callwood, MD;  Location: [facility];  Service: Cardiovascular;  Laterality: N/A;  . COLONOSCOPY WITH PROPOFOL N/A 07/13/2015   Procedure: COLONOSCOPY WITH PROPOFOL;    Surgeon: Lucilla Lame, MD;  Location: [facility];  Service: Endoscopy;  Laterality: N/A;  . CORONARY ANGIOPLASTY  10/05 and 12/05   4 DES placed  . ESOPHAGECTOMY  10/07/2016   @ [facility]  . ESOPHAGOGASTRODUODENOSCOPY (EGD) WITH PROPOFOL N/A 06/14/2016   Procedure: ESOPHAGOGASTRODUODENOSCOPY (EGD) WITH PROPOFOL;  Surgeon:  [name], MD;  Location: [facility];  Service: Endoscopy;  Laterality: N/A;  . HERNIA REPAIR Bilateral    x3  . INGUINAL HERNIA REPAIR Right 08/28/2017   Large PerFix plug, recurrent hernia;  Surgeon: Robert Bellow, MD;  Location: [facility];  Service: General;  Laterality: Right;  recurrent  . POLYPECTOMY  07/13/2015   Procedure: POLYPECTOMY;  Surgeon: Lucilla Lame, MD;  Location: [facility];  Service: Endoscopy;;  . PORT-A-CATH REMOVAL N/A 01/16/2017   Procedure: REMOVAL PORT-A-CATH;  Surgeon: Olean Ree, MD;  Location: [facility];  Service: General;  Laterality: N/A;  . PORTACATH PLACEMENT N/A 07/08/2016   Procedure: INSERTION PORT-A-CATH;  Surgeon: Olean Ree, MD;  Location: [facility];  Service: General;  Laterality: N/A;    FAMILY HISTORY Family History  Problem Relation Age of Onset  . Hypertension Mother   . Dementia Mother   . Heart disease Father   . Hypertension Father   . Kidney disease Father   . Hypertension Brother   . Heart disease Brother   . Diabetes Son   . Hypertension Son   . Hyperlipidemia Son   . Hyperlipidemia Daughter   . Hypertension Daughter        ADVANCED DIRECTIVES:    HEALTH MAINTENANCE: Social History   Tobacco Use  . Smoking status: Former Smoker    Packs/day: 1.00    Years: 40.00    Pack years: 40.00    Types: Cigarettes    Quit date: 07/16/2004    Years since quitting: 14.7  . Smokeless tobacco: Former Systems developer    Types: Chew  Substance Use Topics  . Alcohol use: No    Alcohol/week: 0.0 standard drinks  . Drug use: No     Colonoscopy:  PAP:  Bone density:  Lipid panel:  No Known Allergies  Current Outpatient Medications  Medication Sig Dispense Refill  . acetaminophen (TYLENOL) 500 MG tablet Take 500 mg by mouth every 6 (six) hours as needed for mild pain or moderate pain.    Marland Kitchen aspirin 81 MG tablet Take 81 mg by mouth daily. AM    . atorvastatin (LIPITOR) 40 MG tablet Take 1 tablet (40 mg total) by  mouth daily. 90 tablet 4  . clopidogrel (PLAVIX) 75 MG tablet Take 1 tablet (75 mg total) by mouth daily. 90 tablet 2  . HYDROcodone-acetaminophen (NORCO) 5-325 MG tablet Take 1 tablet by mouth every 4 (four) hours as needed for moderate pain. 6 tablet 0  . isosorbide mononitrate (IMDUR) 30 MG 24 hr tablet Take 30 mg by mouth daily. Takes at night  5  . levothyroxine (SYNTHROID, LEVOTHROID) 50 MCG tablet Take 1 tablet (50 mcg total) by mouth daily. 90 tablet 4  . lisinopril (PRINIVIL,ZESTRIL) 10 MG tablet Take 1 tablet (10 mg total) by mouth every morning. 90 tablet 4  . metoprolol succinate (TOPROL-XL) 25 MG 24 hr tablet Take 25 mg by mouth daily.    . multivitamin-iron-minerals-folic acid (CENTRUM) chewable tablet Chew 1 tablet by mouth daily.    . nitroGLYCERIN (NITROSTAT) 0.4 MG SL tablet Place 1 tablet under the tongue every 5 (five) minutes as needed for chest pain.  98  . ondansetron (ZOFRAN) 4 MG tablet Take 1 tablet (4 mg total) by mouth daily as needed for nausea or vomiting. 14 tablet 0  . pantoprazole (PROTONIX) 20 MG tablet Take 1 tablet (20 mg total) by mouth daily. 90 tablet 4  . tamsulosin (FLOMAX) 0.4 MG CAPS capsule Take 1 capsule (0.4 mg total) by mouth daily after supper. 10 capsule 0   No current facility-administered medications for this visit.     OBJECTIVE: Vitals:   04/30/19 1101  BP: 106/61  Pulse: 62  Resp: 18  Temp: (!) 95.4 F (35.2 C)     Body mass index is 22.2 kg/m.    ECOG FS:0 - Asymptomatic  General: Well-developed, well-nourished, no acute distress. Eyes: Pink conjunctiva, anicteric sclera. HEENT: Normocephalic, moist mucous membranes. Lungs: Clear to auscultation bilaterally. Heart: Regular rate and rhythm. No rubs, murmurs, or gallops. Abdomen: Soft, nontender, nondistended. No organomegaly noted, normoactive bowel sounds. Musculoskeletal: No edema, cyanosis, or clubbing. Neuro: Alert, answering all questions appropriately. Cranial nerves  grossly intact. Skin: No rashes or petechiae noted. Psych: Normal affect.  LAB RESULTS:  Lab Results  Component Value Date   NA 141 03/15/2019   K 4.6 03/15/2019   CL 102 03/15/2019   CO2 27 03/15/2019   GLUCOSE 108 (H) 03/15/2019   BUN 23 03/15/2019   CREATININE 1.24 03/15/2019   CALCIUM 9.1 03/15/2019   PROT 8.0 10/18/2018   ALBUMIN 4.7 10/18/2018   AST 31 03/15/2019   ALT 28 03/15/2019   ALKPHOS 62 10/18/2018   BILITOT 1.0 10/18/2018   GFRNONAA 58 (L) 03/15/2019   GFRAA 67 03/15/2019    Lab Results  Component Value Date   WBC 3.7 03/15/2019   NEUTROABS 2.7 03/15/2019   HGB 13.0 03/15/2019   HCT 39.2 03/15/2019   MCV 98 (H) 03/15/2019   PLT 130 (L) 03/15/2019     STUDIES: Abdomen 1 View (kub)  Result Date: 04/02/2019 CLINICAL DATA:  Follow-up nephrolithiasis. EXAM: ABDOMEN - 1 VIEW COMPARISON:  Renal ultrasound dated 11/06/2018 and abdomen and pelvis CT dated 10/18/2018. Abdomen radiograph dated 11/06/2018. FINDINGS: A normal bowel gas pattern is again demonstrated. A left renal calculus is also again demonstrated, measuring 9 mm. No other urinary tract calculi are visible. Mild atheromatous arterial calcifications. Lumbar spine degenerative changes. IMPRESSION: 9 mm left renal calculus. Electronically Signed   By: Claudie Revering M.D.   On: 04/02/2019 08:31   Ct Hematuria Workup  Result Date: 04/22/2019 CLINICAL DATA:  Right flank pain and hematuria. EXAM: CT ABDOMEN AND PELVIS WITHOUT AND WITH CONTRAST TECHNIQUE: Multidetector CT imaging of the abdomen and pelvis was performed following the standard protocol before and following the bolus administration of intravenous contrast. CONTRAST:  120m OMNIPAQUE IOHEXOL 300 MG/ML  SOLN COMPARISON:  10/18/2018 FINDINGS: Lower chest: Right lower lobe scarring. 11 mm subpleural spiculated nodule is identified in the posterior right lower lobe. The previously 8 mm. Additional, stable lateral right lower lobe and right middle lobe lung  nodules measuring up to 5 mm. Hepatobiliary: No focal liver abnormality is seen. No gallstones, gallbladder wall thickening, or biliary dilatation. Pancreas: Unremarkable. No pancreatic ductal dilatation or surrounding inflammatory changes. Spleen: Spleen is enlarged measuring 13 cm in len dome days gth. No focal splenic lesion identified. Adrenals/Urinary Tract: Normal appearance of the right adrenal gland. A normal left adrenal gland is not visualized on today's study. Left kidney cyst measures 1.3 cm. There is a stone identified within the central portion of the left renal  pelvis which measures 11 mm in maximum dimension. There is asymmetric urothelial thickening and enhancement of the left renal pelvis. No hydronephrosis. No ureteral calculi or bladder calculi identified. Stomach/Bowel: There are postsurgical changes from prior esophagectomy and gastric no pelvic or inguinal adenopathy. Interposition. There is a heterogeneously enhancing soft tissue mass just below the level of the diaphragmatic hiatus and extending to involve the posterior wall of the gastric antrum. This measures 5.7 cm cranial caudal, 3.7 cm transverse, and 3.3 cm AP. The mass completely encases the celiac artery and partially encases the proximal superior mesenteric artery, image 14/9 and image 18/9. The small and large bowel loops are unremarkable. The tumor extends into the left retroperitoneum and engulfs the left adrenal gland, image 16/9. Vascular/Lymphatic: Aortic atherosclerosis. The infrarenal abdominal aorta measures 3 cm in maximum AP dimension. Retroperitoneal adenopathy identified. Index aortocaval node measures 1.5 cm, image 26/9. Reproductive: Prostate is unremarkable. Other: There is a small volume of ascites identified within the pelvis. No convincing evidence for peritoneal nodularity. Musculoskeletal: No acute or significant osseous findings. IMPRESSION: 1. Imaging findings are highly concerning for esophageal carcinoma  tumor recurrence within the upper abdomen. There is a retroperitoneal mass involving the posterior wall of the gastric antrum and encasing the celiac artery and superior mesenteric artery as well as the left adrenal gland. Recommend further restaging with PET-CT. 2. Enlarging spiculated nodule in the posterior right lower lobe may represent a primary lung neoplasm or focus of metastatic disease. 3. Left renal pelvis stone without hydronephrosis. There is asymmetric urothelial thickening and enhancement of the left renal pelvis. 4. Small volume of ascites. New from previous exam. No definite peritoneal nodularity identified. 5. These results will be called to the ordering clinician or representative by the Radiologist Assistant, and communication documented in the PACS or zVision Dashboard. Electronically Signed   By: Taylor  Stroud M.D.   On: 04/22/2019 12:53    ASSESSMENT: Pathologic Stage IIb (T3, N0, M0) adenocarcinoma of the lower third of the esophagus.  PLAN:    1. Stage IIb adenocarcinoma of the lower third of the esophagus: Patient initially completed 6 cycles of weekly carboplatinum and Taxol on August 21, 2016 and daily XRT completed on August 27, 2016. Patient had his esophagectomy on October 07, 2016.  CT scan results from April 22, 2019 reviewed independently and reported as above concerning for recurrence of disease. PET scan completed [facility] on April 13, 2019 that revealed a soft tissue thickening along the proximal aspect of the anastomotic site with increased FDG activity with SUV of 5.1.  Patient also noted to have retroperitoneal lymphadenopathy with mild FDG activity with SUV of 6.1.  Unclear if this is recurrence or not.  Patient has a biopsy scheduled at [facility] on Monday, May 03, 2019.  Once this biopsy is resulted, patient will return to clinic for either discussion of treatment options or continued surveillance.  2. Thrombocytopenia: Patient's most recent  platelet count was 130.  Previously entire workup did not reveal a distinct etiology. No bone marrow biopsy has been performed to this point. Continue to monitor closely.  4. Renal insufficiency: Mild.  Patient's most recent creatinine is at his baseline of 1.24.   5. Nocturia: Patient does not complain of this today.  Consider urology referral if symptoms continue to be disruptive. 6.  Leukopenia: Resolved.  I spent a total of 30 minutes face-to-face with the patient of which greater than 50% of the visit was spent in counseling and coordination   of care as detailed above.  Patient expressed understanding and was in agreement with this plan. He also understands that He can call clinic at any time with any questions, concerns, or complaints.    Timothy J Finnegan, MD 04/30/19 4:56 PM             

## 2019-05-03 DIAGNOSIS — C159 Malignant neoplasm of esophagus, unspecified: Secondary | ICD-10-CM | POA: Diagnosis not present

## 2019-05-03 DIAGNOSIS — D68318 Other hemorrhagic disorder due to intrinsic circulating anticoagulants, antibodies, or inhibitors: Secondary | ICD-10-CM | POA: Diagnosis not present

## 2019-05-03 DIAGNOSIS — R1900 Intra-abdominal and pelvic swelling, mass and lump, unspecified site: Secondary | ICD-10-CM | POA: Diagnosis not present

## 2019-05-03 DIAGNOSIS — R1909 Other intra-abdominal and pelvic swelling, mass and lump: Secondary | ICD-10-CM | POA: Diagnosis not present

## 2019-05-12 ENCOUNTER — Other Ambulatory Visit: Payer: Self-pay

## 2019-05-12 ENCOUNTER — Ambulatory Visit (INDEPENDENT_AMBULATORY_CARE_PROVIDER_SITE_OTHER): Payer: Medicare Other | Admitting: Urology

## 2019-05-12 ENCOUNTER — Encounter: Payer: Self-pay | Admitting: Urology

## 2019-05-12 VITALS — BP 118/77 | HR 64 | Ht 69.0 in | Wt 150.0 lb

## 2019-05-12 DIAGNOSIS — I2510 Atherosclerotic heart disease of native coronary artery without angina pectoris: Secondary | ICD-10-CM | POA: Diagnosis not present

## 2019-05-12 DIAGNOSIS — R310 Gross hematuria: Secondary | ICD-10-CM

## 2019-05-12 DIAGNOSIS — N201 Calculus of ureter: Secondary | ICD-10-CM | POA: Diagnosis not present

## 2019-05-12 DIAGNOSIS — I2583 Coronary atherosclerosis due to lipid rich plaque: Secondary | ICD-10-CM

## 2019-05-12 NOTE — Progress Notes (Signed)
05/12/2019 3:47 PM   Corey Perry [birth year] [id number]  Referring provider: [name], MD [address] [place],  [place] [address]  No chief complaint on file.   HPI:  F/u  -- kidney stones -   He developed left lower quadrant pain and flank pain August 18, 2019 radiating to the left groin. CT scan of the abdomen and pelvis with no stone of distal ureter revealed a 3 mm stone in the left distal ureter a centimeter or 2 from the left UVJ. It was difficult to see the stone on the scout image. There was an 8 mm left lower pole stone clearly visible on the scout image.  His pain resolved. A f/u KUB revealed the left lower pole stone, no ureteral stones.  Also, renal ultrasound shows resolution of hydronephrosis. He has chronic back pain.  A follow-up KUB on 04/02/2019 revealed the stone may have moved up into the left renal pelvis.  It was visible at 9 mm over the left renal shadow.  He then saw PA Sam on 04/22/2019 with some left flank pain and rust colored urine.  UA with greater than 30 red blood cells.  CT hematuria was done which unfortunately showed possible recurrence of the esophageal cancer and he is being evaluated at [facility] with PET and biopsies. The scan also showed the stone had indeed moved out of the left lower pole up toward the left renal pelvis (visible on the scout, 1700 HU, SSD 8 cm). The stone appears a mm or two larger than prior CT 10/2018 as well.   He is on plavix for cardiac stents but can stop. He carries NTG but not had to take it.Dr. Neoma Laming is his cardiologist.   He had esophageal cancer and surgery and XRT and chemo in 2017. He passed a stone back in 2015 or 2016.   He typically voids with a good flow. He has had no further flank pain or gross hematuria.    PMH: Past Medical History:  Diagnosis Date  . Abnormal white blood cell (WBC) count    seeing Dr. [name] at [facility] on 10/25  . Atrial fibrillation ([place])   . BPH (benign prostatic  hypertrophy)   . Bradycardia   . CAD (coronary artery disease)   . Dysrhythmia    bradycardia - sees Dr. Humphrey Rolls  . Esophageal cancer ([place]) 05/2016  . GERD (gastroesophageal reflux disease)   . History of kidney stones   . Hyperlipidemia   . Hypertension   . Hypothyroidism   . Myocardial infarction ([place]) 2005  . S/P angioplasty with stent 2005   x 4 vessels  . Sleep apnea    mild-NO CPAP  . Spinal stenosis    lumbar    Surgical History: Past Surgical History:  Procedure Laterality Date  . BACK SURGERY    . CARDIAC CATHETERIZATION Left 03/12/2016   Procedure: Left Heart Cath and Coronary Angiography;  Surgeon: [name], MD;  Location: [facility];  Service: Cardiovascular;  Laterality: Left;  . CARDIAC CATHETERIZATION N/A 03/12/2016   Procedure: Coronary Stent Intervention;  Surgeon: Yolonda Kida, MD;  Location: [place] [facility];  Service: Cardiovascular;  Laterality: N/A;  . COLONOSCOPY WITH PROPOFOL N/A 07/13/2015   Procedure: COLONOSCOPY WITH PROPOFOL;  Surgeon: Lucilla Lame, MD;  Location: [facility];  Service: Endoscopy;  Laterality: N/A;  . CORONARY ANGIOPLASTY  10/05 and 12/05   4 DES placed  . ESOPHAGECTOMY  10/07/2016   @  [facility]  . ESOPHAGOGASTRODUODENOSCOPY (EGD) WITH PROPOFOL N/A 06/14/2016   Procedure: ESOPHAGOGASTRODUODENOSCOPY (EGD) WITH PROPOFOL;  Surgeon: [name], MD;  Location: [facility];  Service: Endoscopy;  Laterality: N/A;  . HERNIA REPAIR Bilateral    x3  . INGUINAL HERNIA REPAIR Right 08/28/2017   Large PerFix plug, recurrent hernia;  Surgeon: Robert Bellow, MD;  Location: [facility];  Service: General;  Laterality: Right;  recurrent  . POLYPECTOMY  07/13/2015   Procedure: POLYPECTOMY;  Surgeon: Lucilla Lame, MD;  Location: [facility];  Service: Endoscopy;;  . PORT-A-CATH REMOVAL N/A 01/16/2017   Procedure: REMOVAL PORT-A-CATH;  Surgeon: Olean Ree, MD;  Location: [facility];  Service: General;   Laterality: N/A;  . PORTACATH PLACEMENT N/A 07/08/2016   Procedure: INSERTION PORT-A-CATH;  Surgeon: Olean Ree, MD;  Location: [facility];  Service: General;  Laterality: N/A;    Home Medications:  Allergies as of 05/12/2019   No Known Allergies     Medication List       Accurate as of May 12, 2019  3:47 PM. If you have any questions, ask your nurse or doctor.        acetaminophen 500 MG tablet Commonly known as: TYLENOL Take 500 mg by mouth every 6 (six) hours as needed for mild pain or moderate pain.   aspirin 81 MG tablet Take 81 mg by mouth daily. AM   atorvastatin 40 MG tablet Commonly known as: LIPITOR Take 1 tablet (40 mg total) by mouth daily.   clopidogrel 75 MG tablet Commonly known as: PLAVIX Take 1 tablet (75 mg total) by mouth daily.   HYDROcodone-acetaminophen 5-325 MG tablet Commonly known as: Norco Take 1 tablet by mouth every 4 (four) hours as needed for moderate pain.   isosorbide mononitrate 30 MG 24 hr tablet Commonly known as: IMDUR Take 30 mg by mouth daily. Takes at night   levothyroxine 50 MCG tablet Commonly known as: SYNTHROID Take 1 tablet (50 mcg total) by mouth daily.   lisinopril 10 MG tablet Commonly known as: ZESTRIL Take 1 tablet (10 mg total) by mouth every morning.   metoprolol succinate 25 MG 24 hr tablet Commonly known as: TOPROL-XL Take 25 mg by mouth daily.   multivitamin-iron-minerals-folic acid chewable tablet Chew 1 tablet by mouth daily.   nitroGLYCERIN 0.4 MG SL tablet Commonly known as: NITROSTAT Place 1 tablet under the tongue every 5 (five) minutes as needed for chest pain.   ondansetron 4 MG tablet Commonly known as: Zofran Take 1 tablet (4 mg total) by mouth daily as needed for nausea or vomiting.   pantoprazole 20 MG tablet Commonly known as: PROTONIX Take 1 tablet (20 mg total) by mouth daily.   tamsulosin 0.4 MG Caps capsule Commonly known as: FLOMAX Take 1 capsule (0.4 mg total) by mouth daily  after supper.       Allergies: No Known Allergies  Family History: Family History  Problem Relation Age of Onset  . Hypertension Mother   . Dementia Mother   . Heart disease Father   . Hypertension Father   . Kidney disease Father   . Hypertension Brother   . Heart disease Brother   . Diabetes Son   . Hypertension Son   . Hyperlipidemia Son   . Hyperlipidemia Daughter   . Hypertension Daughter     Social History:  reports that he quit smoking about 14 years ago. His smoking use included cigarettes. He has a 40.00 pack-year smoking history. He has quit  using smokeless tobacco.  His smokeless tobacco use included chew. He reports that he does not drink alcohol or use drugs.  ROS:                                        Physical Exam: There were no vitals taken for this visit.  Constitutional:  Alert and oriented, No acute distress. HEENT: [place] AT, moist mucus membranes.  Trachea midline, no masses. Cardiovascular: No clubbing, cyanosis, or edema. Respiratory: Normal respiratory effort, no increased work of breathing. GI: Abdomen is soft, nontender, nondistended, no abdominal masses GU: No CVA tenderness Skin: No rashes, bruises or suspicious lesions. Neurologic: Grossly intact, no focal deficits, moving all 4 extremities. Psychiatric: Normal mood and affect.  Laboratory Data: Lab Results  Component Value Date   WBC 3.7 03/15/2019   HGB 13.0 03/15/2019   HCT 39.2 03/15/2019   MCV 98 (H) 03/15/2019   PLT 130 (L) 03/15/2019    Lab Results  Component Value Date   CREATININE 1.24 03/15/2019    No results found for: PSA  No results found for: TESTOSTERONE  No results found for: HGBA1C  Urinalysis    Component Value Date/Time   COLORURINE STRAW (A) 10/18/2018 0647   APPEARANCEUR Cloudy (A) 04/01/2019 1509   LABSPEC 1.010 10/18/2018 0647   PHURINE 5.0 10/18/2018 0647   GLUCOSEU Negative 04/01/2019 1509   HGBUR SMALL (A) 10/18/2018 0647    BILIRUBINUR Negative 04/01/2019 1509   KETONESUR NEGATIVE 10/18/2018 0647   PROTEINUR Negative 04/01/2019 1509   PROTEINUR NEGATIVE 10/18/2018 0647   NITRITE Negative 04/01/2019 1509   NITRITE NEGATIVE 10/18/2018 0647   LEUKOCYTESUR Negative 04/01/2019 1509    Lab Results  Component Value Date   LABMICR See below: 04/01/2019   WBCUA 0-5 04/01/2019   RBCUA 3-10 (A) 10/28/2018   LABEPIT 0-10 04/01/2019   MUCUS Present 06/21/2015   BACTERIA None seen 04/01/2019    Pertinent Imaging: CT  x2  , KUB  Results for orders placed during the hospital encounter of 04/01/19  Abdomen 1 view (KUB)   Narrative CLINICAL DATA:  Follow-up nephrolithiasis.  EXAM: ABDOMEN - 1 VIEW  COMPARISON:  Renal ultrasound dated 11/06/2018 and abdomen and pelvis CT dated 10/18/2018. Abdomen radiograph dated 11/06/2018.  FINDINGS: A normal bowel gas pattern is again demonstrated. A left renal calculus is also again demonstrated, measuring 9 mm. No other urinary tract calculi are visible. Mild atheromatous arterial calcifications. Lumbar spine degenerative changes.  IMPRESSION: 9 mm left renal calculus.   Electronically Signed   By: Claudie Revering M.D.   On: 04/02/2019 08:31    No results found for this or any previous visit. No results found for this or any previous visit. No results found for this or any previous visit. Results for orders placed during the hospital encounter of 11/06/18  US RENAL   Narrative CLINICAL DATA:  History of renal ureteral stone disease.  EXAM: RENAL / URINARY TRACT ULTRASOUND COMPLETE  COMPARISON:  Abdomen series 11/06/2018.  CT 10/18/2018.  FINDINGS: Right Kidney:  Renal measurements: 10.0 x 5.1 x 5.6 cm = volume: 148 mL . Echogenicity within normal limits. No mass or hydronephrosis visualized.  Left Kidney:  Renal measurements: 10.9 x 4.4 x 5.1 cm = volume: 129 mL. Echogenicity within normal limits. No mass or hydronephrosis visualized. 9 mm renal  stone again noted. 1.7 cm simple cyst again noted.  Bladder:  Appears normal for degree of bladder distention. Slightly prominent prostate.  IMPRESSION: 1. No evidence of hydronephrosis noted on today's exam. Previous identified left hydronephrosis has cleared.  2. Persistent nephrolithiasis on the left noted. Simple cyst on the left again noted. No interim change.  3.  Slightly prominent prostate.  No bladder distention.   Electronically Signed   By: Marcello Moores  Register   On: 11/06/2018 13:43    No results found for this or any previous visit. Results for orders placed in visit on 04/01/19  CT HEMATURIA WORKUP   Narrative CLINICAL DATA:  Right flank pain and hematuria.  EXAM: CT ABDOMEN AND PELVIS WITHOUT AND WITH CONTRAST  TECHNIQUE: Multidetector CT imaging of the abdomen and pelvis was performed following the standard protocol before and following the bolus administration of intravenous contrast.  CONTRAST:  156mL OMNIPAQUE IOHEXOL 300 MG/ML  SOLN  COMPARISON:  10/18/2018  FINDINGS: Lower chest: Right lower lobe scarring. 11 mm subpleural spiculated nodule is identified in the posterior right lower lobe. The previously 8 mm. Additional, stable lateral right lower lobe and right middle lobe lung nodules measuring up to 5 mm.  Hepatobiliary: No focal liver abnormality is seen. No gallstones, gallbladder wall thickening, or biliary dilatation.  Pancreas: Unremarkable. No pancreatic ductal dilatation or surrounding inflammatory changes.  Spleen: Spleen is enlarged measuring 13 cm in len dome days gth. No focal splenic lesion identified.  Adrenals/Urinary Tract: Normal appearance of the right adrenal gland. A normal left adrenal gland is not visualized on today's study.  Left kidney cyst measures 1.3 cm. There is a stone identified within the central portion of the left renal pelvis which measures 11 mm in maximum dimension. There is asymmetric urothelial  thickening and enhancement of the left renal pelvis. No hydronephrosis. No ureteral calculi or bladder calculi identified.  Stomach/Bowel: There are postsurgical changes from prior esophagectomy and gastric no pelvic or inguinal adenopathy. Interposition. There is a heterogeneously enhancing soft tissue mass just below the level of the diaphragmatic hiatus and extending to involve the posterior wall of the gastric antrum. This measures 5.7 cm cranial caudal, 3.7 cm transverse, and 3.3 cm AP. The mass completely encases the celiac artery and partially encases the proximal superior mesenteric artery, image 14/9 and image 18/9. The small and large bowel loops are unremarkable. The tumor extends into the left retroperitoneum and engulfs the left adrenal gland, image 16/9.  Vascular/Lymphatic: Aortic atherosclerosis. The infrarenal abdominal aorta measures 3 cm in maximum AP dimension. Retroperitoneal adenopathy identified. Index aortocaval node measures 1.5 cm, image 26/9.  Reproductive: Prostate is unremarkable.  Other: There is a small volume of ascites identified within the pelvis. No convincing evidence for peritoneal nodularity.  Musculoskeletal: No acute or significant osseous findings.  IMPRESSION: 1. Imaging findings are highly concerning for esophageal carcinoma tumor recurrence within the upper abdomen. There is a retroperitoneal mass involving the posterior wall of the gastric antrum and encasing the celiac artery and superior mesenteric artery as well as the left adrenal gland. Recommend further restaging with PET-CT. 2. Enlarging spiculated nodule in the posterior right lower lobe may represent a primary lung neoplasm or focus of metastatic disease. 3. Left renal pelvis stone without hydronephrosis. There is asymmetric urothelial thickening and enhancement of the left renal pelvis. 4. Small volume of ascites. New from previous exam. No definite peritoneal nodularity  identified. 5. These results will be called to the ordering clinician or representative by the Radiologist Assistant, and communication documented in the PACS  or zVision Dashboard.   Electronically Signed   By: Kerby Moors M.D.   On: 04/22/2019 12:53    Results for orders placed during the hospital encounter of 10/18/18  CT Renal Stone Study   Narrative CLINICAL DATA:  Acute left flank pain.  EXAM: CT ABDOMEN AND PELVIS WITHOUT CONTRAST  TECHNIQUE: Multidetector CT imaging of the abdomen and pelvis was performed following the standard protocol without IV contrast.  COMPARISON:  CT scan of December 21, 2016.  FINDINGS: Lower chest: Large sliding-type hiatal hernia is noted. Visualized lung bases are unremarkable.  Hepatobiliary: No focal liver abnormality is seen. No gallstones, gallbladder wall thickening, or biliary dilatation.  Pancreas: Unremarkable. No pancreatic ductal dilatation or surrounding inflammatory changes.  Spleen: Normal in size without focal abnormality.  Adrenals/Urinary Tract: Adrenal glands appear normal. Right kidney and ureter are unremarkable. 1 cm nonobstructive calculus is noted in lower pole calyx of left kidney. Mild left hydronephrosis is noted secondary to 3 mm calculus in distal left ureter. Urinary bladder is unremarkable.  Stomach/Bowel: Stomach is within normal limits. Appendix appears normal. No evidence of bowel wall thickening, distention, or inflammatory changes. Sigmoid diverticulosis is noted without inflammation.  Vascular/Lymphatic: Atherosclerosis of abdominal aorta is noted. 3.2 cm infrarenal abdominal aortic aneurysm is noted. No adenopathy is noted.  Reproductive: Prostate is unremarkable.  Other: No abdominal wall hernia or abnormality. No abdominopelvic ascites.  Musculoskeletal: No acute or significant osseous findings.  IMPRESSION: Mild left hydroureteronephrosis is noted secondary to 3 mm calculus in distal  left ureter. 1 cm nonobstructive calculus seen in left kidney.  3.2 cm infrarenal abdominal aortic aneurysm. Recommend followup by ultrasound in 3 years. This recommendation follows ACR consensus guidelines: White Paper of the ACR Incidental Findings Committee II on Vascular Findings. J Am Coll Radiol 2013; 10:789-794  Large sliding-type hiatal hernia.  Sigmoid diverticulosis without inflammation.  Aortic Atherosclerosis (ICD10-I70.0).   Electronically Signed   By: Marijo Conception, M.D.   On: 10/18/2018 09:33     Assessment & Plan:    Left UPJ stone -  This stone was a bit smaller and in the LLP on 10/2018 scan. Discussed with him the nature r/b/a to cystoscopy, left retrograde and left URS/HLL with stent. He elects to proceed. Discussed he may need a staged procedure and one of my colleagues would do it. He can stay on Plavix but we will get clearance from Dr. Humphrey Rolls for anesthesia. We also discussed surveillance or ESWL. He's not the best candidate for ESWL given the HU of 1700 and plavix use/cardiac risk.   Hematuria - likely from UPJ stone, but cysto in OR will complete the evaluation as well.    No follow-ups on file.  Festus Aloe, MD  [facility] [address], [place], [place] [address] [phone]

## 2019-05-13 ENCOUNTER — Telehealth: Payer: Self-pay

## 2019-05-13 LAB — URINALYSIS, COMPLETE
Bilirubin, UA: NEGATIVE
Glucose, UA: NEGATIVE
Ketones, UA: NEGATIVE
Leukocytes,UA: NEGATIVE
Nitrite, UA: NEGATIVE
Specific Gravity, UA: >1.03 — ABNORMAL HIGH (ref 1.005–1.030)
Urobilinogen, Ur: 0.2 mg/dL (ref 0.2–1.0)
pH, UA: 5 (ref 5.0–7.5)

## 2019-05-13 LAB — MICROSCOPIC EXAMINATION: RBC, Urine: >30 /hpf — AB (ref 0–2)

## 2019-05-13 NOTE — Telephone Encounter (Signed)
Patient will come in tomorrow to do the UA    Corey Perry

## 2019-05-13 NOTE — Telephone Encounter (Signed)
Left detailed message per DPR and mychart sent

## 2019-05-13 NOTE — Telephone Encounter (Signed)
-----   Message from Festus Aloe, MD sent at 05/13/2019 10:47 AM EDT ----- Can we send his urine for culture or have him come by for one? Thanks.  ----- Message ----- From: Royanne Foots, CMA Sent: 05/12/2019   4:50 PM EDT To: Festus Aloe, MD   ----- Message ----- From: Lavone Neri Lab Results In Sent: 05/12/2019   4:38 PM EDT To: Rowe Robert Clinical

## 2019-05-14 ENCOUNTER — Other Ambulatory Visit: Payer: Medicare Other

## 2019-05-14 ENCOUNTER — Other Ambulatory Visit: Payer: Self-pay

## 2019-05-14 DIAGNOSIS — R3129 Other microscopic hematuria: Secondary | ICD-10-CM | POA: Diagnosis not present

## 2019-05-14 LAB — URINALYSIS, COMPLETE
Bilirubin, UA: NEGATIVE
Glucose, UA: NEGATIVE
Ketones, UA: NEGATIVE
Leukocytes,UA: NEGATIVE
Nitrite, UA: NEGATIVE
Protein,UA: NEGATIVE
RBC, UA: NEGATIVE
Specific Gravity, UA: 1.025 (ref 1.005–1.030)
Urobilinogen, Ur: 0.2 mg/dL (ref 0.2–1.0)
pH, UA: 5 (ref 5.0–7.5)

## 2019-05-14 LAB — MICROSCOPIC EXAMINATION: RBC, Urine: NONE SEEN /hpf (ref 0–2)

## 2019-05-17 LAB — CULTURE, URINE COMPREHENSIVE

## 2019-05-25 DIAGNOSIS — I10 Essential (primary) hypertension: Secondary | ICD-10-CM | POA: Diagnosis not present

## 2019-05-25 DIAGNOSIS — R0602 Shortness of breath: Secondary | ICD-10-CM | POA: Diagnosis not present

## 2019-05-25 DIAGNOSIS — E785 Hyperlipidemia, unspecified: Secondary | ICD-10-CM | POA: Diagnosis not present

## 2019-05-25 DIAGNOSIS — I2510 Atherosclerotic heart disease of native coronary artery without angina pectoris: Secondary | ICD-10-CM | POA: Diagnosis not present

## 2019-05-27 ENCOUNTER — Encounter: Payer: Self-pay | Admitting: Family Medicine

## 2019-05-27 DIAGNOSIS — R0602 Shortness of breath: Secondary | ICD-10-CM | POA: Diagnosis not present

## 2019-05-27 DIAGNOSIS — I2510 Atherosclerotic heart disease of native coronary artery without angina pectoris: Secondary | ICD-10-CM | POA: Diagnosis not present

## 2019-06-10 ENCOUNTER — Other Ambulatory Visit: Payer: Medicare Other

## 2019-06-10 ENCOUNTER — Other Ambulatory Visit: Payer: Self-pay | Admitting: Radiology

## 2019-06-10 ENCOUNTER — Other Ambulatory Visit
Admission: RE | Admit: 2019-06-10 | Discharge: 2019-06-10 | Disposition: A | Discharge status: Home / Self Care | Payer: Medicare Other | Source: Ambulatory Visit | Attending: Urology | Admitting: Urology

## 2019-06-10 ENCOUNTER — Other Ambulatory Visit: Payer: Self-pay

## 2019-06-10 ENCOUNTER — Encounter
Admission: RE | Admit: 2019-06-10 | Discharge: 2019-06-10 | Disposition: A | Discharge status: Home / Self Care | Payer: Medicare Other | Source: Ambulatory Visit | Attending: Urology | Admitting: Urology

## 2019-06-10 DIAGNOSIS — Z01818 Encounter for other preprocedural examination: Secondary | ICD-10-CM

## 2019-06-10 DIAGNOSIS — I10 Essential (primary) hypertension: Secondary | ICD-10-CM | POA: Diagnosis not present

## 2019-06-10 DIAGNOSIS — N201 Calculus of ureter: Secondary | ICD-10-CM

## 2019-06-10 DIAGNOSIS — Z20828 Contact with and (suspected) exposure to other viral communicable diseases: Secondary | ICD-10-CM | POA: Diagnosis not present

## 2019-06-10 NOTE — Patient Instructions (Signed)
Your procedure is scheduled on: 06/15/2019 Tues Report to Same Day Surgery 2nd floor medical mall [facility] Entrance-take elevator on left to 2nd floor.  Check in with surgery information desk.) To find out your arrival time please call [phone] between 1PM - 3PM on 06/14/2019 Mon  Remember: Instructions that are not followed completely may result in serious medical risk, up to and including death, or upon the discretion of your surgeon and anesthesiologist your surgery may need to be rescheduled.    _x___ 1. Do not eat food after midnight the night before your procedure. You may drink clear liquids up to 2 hours before you are scheduled to arrive at the hospital for your procedure.  Do not drink clear liquids within 2 hours of your scheduled arrival to the hospital.  Clear liquids include  --Water or Apple juice without pulp  --Clear carbohydrate beverage such as ClearFast or Gatorade  --Black Coffee or Clear Tea (No milk, no creamers, do not add anything to                  the coffee or Tea Type 1 and type 2 diabetics should only drink water.   ____Ensure clear carbohydrate drink on the way to the hospital for bariatric patients  ____Ensure clear carbohydrate drink 3 hours before surgery.   No gum chewing or hard candies.     __x__ 2. No Alcohol for 24 hours before or after surgery.   __x__3. No Smoking or e-cigarettes for 24 prior to surgery.  Do not use any chewable tobacco products for at least 6 hour prior to surgery   ____  4. Bring all medications with you on the day of surgery if instructed.    __x__ 5. Notify your doctor if there is any change in your medical condition     (cold, fever, infections).    x___6. On the morning of surgery brush your teeth with toothpaste and water.  You may rinse your mouth with mouth wash if you wish.  Do not swallow any toothpaste or mouthwash.   Do not wear jewelry, make-up, hairpins, clips or nail polish.  Do not wear lotions,  powders, or perfumes. You may wear deodorant.  Do not shave 48 hours prior to surgery. Men may shave face and neck.  Do not bring valuables to the hospital.    [facility] is not responsible for any belongings or valuables.               Contacts, dentures or bridgework may not be worn into surgery.  Leave your suitcase in the car. After surgery it may be brought to your room.  For patients admitted to the hospital, discharge time is determined by your                       treatment team.  _  Patients discharged the day of surgery will not be allowed to drive home.  You will need someone to drive you home and stay with you the night of your procedure.    Please read over the following fact sheets that you were given:   [facility] Preparing for Surgery and or MRSA Information   _x___ Take anti-hypertensive listed below, cardiac, seizure, asthma,     anti-reflux and psychiatric medicines. These include:  1. isosorbide mononitrate (IMDUR) 30 MG 24 hr tablet  2.levothyroxine (SYNTHROID, LEVOTHROID) 50 MCG tablet  3.metoprolol succinate (TOPROL-XL) 25 MG 24 hr tablet  4.pantoprazole (  PROTONIX) 20 MG tablet  5.  6.  ____Fleets enema or Magnesium Citrate as directed.   ____ Use CHG Soap or sage wipes as directed on instruction sheet   ____ Use inhalers on the day of surgery and bring to hospital day of surgery  ____ Stop Metformin and Janumet 2 days prior to surgery.    ____ Take 1/2 of usual insulin dose the night before surgery and none on the morning     surgery.   _x___ Follow recommendations from Cardiologist, Pulmonologist or PCP regarding          stopping Aspirin, Coumadin, Plavix ,Eliquis, Effient, or Pradaxa, and Pletal.  X____Stop Anti-inflammatories such as Advil, Aleve, Ibuprofen, Motrin, Naproxen, Naprosyn, Goodies powders or aspirin products. OK to take Tylenol and                          Celebrex.   _x___ Stop supplements until after surgery.  But may continue Vitamin  D, Vitamin B,       and multivitamin.   ____ Bring C-Pap to the hospital.

## 2019-06-11 ENCOUNTER — Other Ambulatory Visit
Admission: RE | Admit: 2019-06-11 | Discharge: 2019-06-11 | Disposition: A | Discharge status: Home / Self Care | Payer: Medicare Other | Source: Ambulatory Visit | Attending: Urology | Admitting: Urology

## 2019-06-11 LAB — SARS CORONAVIRUS 2 (TAT 6-24 HRS): SARS Coronavirus 2: NEGATIVE

## 2019-06-11 LAB — URINE CULTURE: Culture: NO GROWTH

## 2019-06-14 ENCOUNTER — Other Ambulatory Visit: Payer: Self-pay | Admitting: Family Medicine

## 2019-06-14 DIAGNOSIS — C155 Malignant neoplasm of lower third of esophagus: Secondary | ICD-10-CM

## 2019-06-14 MED ORDER — CLOPIDOGREL BISULFATE 75 MG PO TABS: 75.0000 mg | ORAL_TABLET | Freq: Every day | ORAL | 2 refills | Status: DC

## 2019-06-14 NOTE — Telephone Encounter (Signed)
Medication Refill - Medication: clopidogrel (PLAVIX) 75 MG tablet  Has the patient contacted their pharmacy? no (Agent: If no, request that the patient contact the pharmacy for the refill.) (Agent: If yes, when and what did the pharmacy advise?)  Preferred Pharmacy (with phone number or street name):  [place], [place] [facility] [place] [phone] (Phone) [phone] (Fax)   Agent: Please be advised that RX refills may take up to 3 business days. We ask that you follow-up with your pharmacy.

## 2019-06-15 ENCOUNTER — Other Ambulatory Visit: Payer: Self-pay

## 2019-06-15 ENCOUNTER — Telehealth: Payer: Self-pay | Admitting: Urology

## 2019-06-15 ENCOUNTER — Ambulatory Visit: Payer: Medicare Other | Admitting: Certified Registered Nurse Anesthetist

## 2019-06-15 ENCOUNTER — Encounter: Payer: Self-pay | Admitting: *Deleted

## 2019-06-15 ENCOUNTER — Encounter
Admission: RE | Disposition: A | Discharge status: Home / Self Care | Payer: Self-pay | Source: Home / Self Care | Attending: Urology

## 2019-06-15 ENCOUNTER — Ambulatory Visit
Admission: RE | Admit: 2019-06-15 | Discharge: 2019-06-15 | Disposition: A | Discharge status: Home / Self Care | Payer: Medicare Other | Attending: Urology | Admitting: Urology

## 2019-06-15 DIAGNOSIS — I25119 Atherosclerotic heart disease of native coronary artery with unspecified angina pectoris: Secondary | ICD-10-CM | POA: Diagnosis not present

## 2019-06-15 DIAGNOSIS — Z8501 Personal history of malignant neoplasm of esophagus: Secondary | ICD-10-CM | POA: Insufficient documentation

## 2019-06-15 DIAGNOSIS — N189 Chronic kidney disease, unspecified: Secondary | ICD-10-CM | POA: Diagnosis not present

## 2019-06-15 DIAGNOSIS — Z86010 Personal history of colonic polyps: Secondary | ICD-10-CM | POA: Insufficient documentation

## 2019-06-15 DIAGNOSIS — Z8249 Family history of ischemic heart disease and other diseases of the circulatory system: Secondary | ICD-10-CM | POA: Insufficient documentation

## 2019-06-15 DIAGNOSIS — I252 Old myocardial infarction: Secondary | ICD-10-CM | POA: Diagnosis not present

## 2019-06-15 DIAGNOSIS — R310 Gross hematuria: Secondary | ICD-10-CM | POA: Insufficient documentation

## 2019-06-15 DIAGNOSIS — Z955 Presence of coronary angioplasty implant and graft: Secondary | ICD-10-CM | POA: Diagnosis not present

## 2019-06-15 DIAGNOSIS — I129 Hypertensive chronic kidney disease with stage 1 through stage 4 chronic kidney disease, or unspecified chronic kidney disease: Secondary | ICD-10-CM | POA: Insufficient documentation

## 2019-06-15 DIAGNOSIS — E039 Hypothyroidism, unspecified: Secondary | ICD-10-CM | POA: Insufficient documentation

## 2019-06-15 DIAGNOSIS — Z7982 Long term (current) use of aspirin: Secondary | ICD-10-CM | POA: Insufficient documentation

## 2019-06-15 DIAGNOSIS — E785 Hyperlipidemia, unspecified: Secondary | ICD-10-CM | POA: Insufficient documentation

## 2019-06-15 DIAGNOSIS — I4891 Unspecified atrial fibrillation: Secondary | ICD-10-CM | POA: Diagnosis not present

## 2019-06-15 DIAGNOSIS — Z87442 Personal history of urinary calculi: Secondary | ICD-10-CM | POA: Diagnosis not present

## 2019-06-15 DIAGNOSIS — Z833 Family history of diabetes mellitus: Secondary | ICD-10-CM | POA: Insufficient documentation

## 2019-06-15 DIAGNOSIS — N201 Calculus of ureter: Secondary | ICD-10-CM | POA: Diagnosis not present

## 2019-06-15 DIAGNOSIS — K219 Gastro-esophageal reflux disease without esophagitis: Secondary | ICD-10-CM | POA: Insufficient documentation

## 2019-06-15 DIAGNOSIS — Z79899 Other long term (current) drug therapy: Secondary | ICD-10-CM | POA: Diagnosis not present

## 2019-06-15 DIAGNOSIS — N400 Enlarged prostate without lower urinary tract symptoms: Secondary | ICD-10-CM | POA: Diagnosis not present

## 2019-06-15 DIAGNOSIS — N200 Calculus of kidney: Secondary | ICD-10-CM

## 2019-06-15 DIAGNOSIS — R001 Bradycardia, unspecified: Secondary | ICD-10-CM | POA: Diagnosis not present

## 2019-06-15 DIAGNOSIS — R188 Other ascites: Secondary | ICD-10-CM | POA: Diagnosis not present

## 2019-06-15 DIAGNOSIS — Z87891 Personal history of nicotine dependence: Secondary | ICD-10-CM | POA: Insufficient documentation

## 2019-06-15 DIAGNOSIS — Z820 Family history of epilepsy and other diseases of the nervous system: Secondary | ICD-10-CM | POA: Diagnosis not present

## 2019-06-15 DIAGNOSIS — D729 Disorder of white blood cells, unspecified: Secondary | ICD-10-CM | POA: Insufficient documentation

## 2019-06-15 DIAGNOSIS — M48061 Spinal stenosis, lumbar region without neurogenic claudication: Secondary | ICD-10-CM | POA: Diagnosis not present

## 2019-06-15 DIAGNOSIS — G4730 Sleep apnea, unspecified: Secondary | ICD-10-CM | POA: Insufficient documentation

## 2019-06-15 DIAGNOSIS — N183 Chronic kidney disease, stage 3 (moderate): Secondary | ICD-10-CM | POA: Diagnosis not present

## 2019-06-15 DIAGNOSIS — Z841 Family history of disorders of kidney and ureter: Secondary | ICD-10-CM | POA: Insufficient documentation

## 2019-06-15 HISTORY — PX: CYSTOSCOPY W/ RETROGRADES: SHX1426

## 2019-06-15 HISTORY — PX: CYSTOSCOPY/URETEROSCOPY/HOLMIUM LASER/STENT PLACEMENT: SHX6546

## 2019-06-15 SURGERY — CYSTOSCOPY/URETEROSCOPY/HOLMIUM LASER/STENT PLACEMENT
Anesthesia: General | Site: Ureter | Laterality: Left

## 2019-06-15 MED ORDER — FENTANYL CITRATE (PF) 100 MCG/2ML IJ SOLN
INTRAMUSCULAR | Status: AC
  Filled 2019-06-15: qty 2

## 2019-06-15 MED ORDER — HYDROCODONE-ACETAMINOPHEN 5-325 MG PO TABS: 1.0000 | ORAL_TABLET | ORAL | 0 refills | Status: DC | PRN

## 2019-06-15 MED ORDER — IOPAMIDOL (ISOVUE-200) INJECTION 41%
INTRAVENOUS | Status: DC | PRN
  Administered 2019-06-15: 10:00:00 40 mL via INTRAVENOUS

## 2019-06-15 MED ORDER — PHENYLEPHRINE HCL (PRESSORS) 10 MG/ML IV SOLN
INTRAVENOUS | Status: DC | PRN
  Administered 2019-06-15 (×2): 50 ug via INTRAVENOUS

## 2019-06-15 MED ORDER — ONDANSETRON HCL 4 MG/2ML IJ SOLN
INTRAMUSCULAR | Status: AC
  Filled 2019-06-15: qty 2

## 2019-06-15 MED ORDER — SUGAMMADEX SODIUM 200 MG/2ML IV SOLN
INTRAVENOUS | Status: AC
  Filled 2019-06-15: qty 2

## 2019-06-15 MED ORDER — DEXAMETHASONE SODIUM PHOSPHATE 10 MG/ML IJ SOLN
INTRAMUSCULAR | Status: AC
  Filled 2019-06-15: qty 1

## 2019-06-15 MED ORDER — SUGAMMADEX SODIUM 200 MG/2ML IV SOLN
INTRAVENOUS | Status: DC | PRN
  Administered 2019-06-15: 200 mg via INTRAVENOUS

## 2019-06-15 MED ORDER — PROPOFOL 10 MG/ML IV BOLUS
INTRAVENOUS | Status: AC
  Filled 2019-06-15: qty 20

## 2019-06-15 MED ORDER — PHENYLEPHRINE HCL (PRESSORS) 10 MG/ML IV SOLN
INTRAVENOUS | Status: AC
  Filled 2019-06-15: qty 1

## 2019-06-15 MED ORDER — CEFAZOLIN SODIUM-DEXTROSE 2-3 GM-%(50ML) IV SOLR
INTRAVENOUS | Status: DC | PRN
  Administered 2019-06-15: 2 g via INTRAVENOUS

## 2019-06-15 MED ORDER — FENTANYL CITRATE (PF) 100 MCG/2ML IJ SOLN: 25.0000 ug | INTRAMUSCULAR | Status: DC | PRN

## 2019-06-15 MED ORDER — EPHEDRINE SULFATE 50 MG/ML IJ SOLN
INTRAMUSCULAR | Status: DC | PRN
  Administered 2019-06-15 (×5): 10 mg via INTRAVENOUS

## 2019-06-15 MED ORDER — DEXAMETHASONE SODIUM PHOSPHATE 10 MG/ML IJ SOLN
INTRAMUSCULAR | Status: DC | PRN
  Administered 2019-06-15: 10 mg via INTRAVENOUS

## 2019-06-15 MED ORDER — OXYBUTYNIN CHLORIDE 5 MG PO TABS: ORAL_TABLET | ORAL | 1 refills | Status: DC

## 2019-06-15 MED ORDER — EPHEDRINE SULFATE 50 MG/ML IJ SOLN
INTRAMUSCULAR | Status: AC
  Filled 2019-06-15: qty 1

## 2019-06-15 MED ORDER — LACTATED RINGERS IV SOLN
INTRAVENOUS | Status: DC
  Administered 2019-06-15: 08:00:00 via INTRAVENOUS

## 2019-06-15 MED ORDER — ROCURONIUM BROMIDE 100 MG/10ML IV SOLN
INTRAVENOUS | Status: DC | PRN
  Administered 2019-06-15: 10 mg via INTRAVENOUS
  Administered 2019-06-15: 30 mg via INTRAVENOUS
  Administered 2019-06-15: 10 mg via INTRAVENOUS

## 2019-06-15 MED ORDER — PROPOFOL 10 MG/ML IV BOLUS
INTRAVENOUS | Status: DC | PRN
  Administered 2019-06-15: 150 mg via INTRAVENOUS

## 2019-06-15 MED ORDER — ONDANSETRON HCL 4 MG/2ML IJ SOLN: 4.0000 mg | Freq: Once | INTRAMUSCULAR | Status: DC | PRN

## 2019-06-15 MED ORDER — CEFAZOLIN SODIUM-DEXTROSE 2-4 GM/100ML-% IV SOLN
INTRAVENOUS | Status: AC
  Filled 2019-06-15: qty 100

## 2019-06-15 MED ORDER — FENTANYL CITRATE (PF) 100 MCG/2ML IJ SOLN
INTRAMUSCULAR | Status: DC | PRN
  Administered 2019-06-15 (×2): 50 ug via INTRAVENOUS

## 2019-06-15 MED ORDER — ONDANSETRON HCL 4 MG/2ML IJ SOLN
INTRAMUSCULAR | Status: DC | PRN
  Administered 2019-06-15: 4 mg via INTRAVENOUS

## 2019-06-15 MED ORDER — CEFAZOLIN SODIUM-DEXTROSE 2-4 GM/100ML-% IV SOLN: 2.0000 g | Freq: Once | INTRAVENOUS | Status: DC

## 2019-06-15 MED ORDER — ROCURONIUM BROMIDE 50 MG/5ML IV SOLN
INTRAVENOUS | Status: AC
  Filled 2019-06-15: qty 1

## 2019-06-15 MED ORDER — LIDOCAINE HCL (CARDIAC) PF 100 MG/5ML IV SOSY
PREFILLED_SYRINGE | INTRAVENOUS | Status: DC | PRN
  Administered 2019-06-15: 100 mg via INTRAVENOUS

## 2019-06-15 MED ORDER — LIDOCAINE HCL (PF) 2 % IJ SOLN
INTRAMUSCULAR | Status: AC
  Filled 2019-06-15: qty 10

## 2019-06-15 SURGICAL SUPPLY — 34 items
BAG DRAIN CYSTO-URO LG1000N (MISCELLANEOUS) ×3 IMPLANT
BASKET ZERO TIP 1.9FR (BASKET) ×2 IMPLANT
BRUSH SCRUB EZ  4% CHG (MISCELLANEOUS)
BRUSH SCRUB EZ 1% IODOPHOR (MISCELLANEOUS) ×3 IMPLANT
BRUSH SCRUB EZ 4% CHG (MISCELLANEOUS) ×1 IMPLANT
BSKT STON RTRVL ZERO TP 1.9FR (BASKET) ×1
CATH URETL 5X70 OPEN END (CATHETERS) ×1 IMPLANT
CNTNR SPEC 2.5X3XGRAD LEK (MISCELLANEOUS) ×1
CONT SPEC 4OZ STER OR WHT (MISCELLANEOUS) ×2
CONT SPEC 4OZ STRL OR WHT (MISCELLANEOUS) ×1
CONTAINER SPEC 2.5X3XGRAD LEK (MISCELLANEOUS) IMPLANT
DRAPE UTILITY 15X26 TOWEL STRL (DRAPES) ×3 IMPLANT
FIBER LASER TRAC TIP (UROLOGICAL SUPPLIES) ×2 IMPLANT
GLOVE BIO SURGEON STRL SZ8 (GLOVE) ×3 IMPLANT
GOWN STRL REUS W/ TWL LRG LVL3 (GOWN DISPOSABLE) ×1 IMPLANT
GOWN STRL REUS W/ TWL XL LVL3 (GOWN DISPOSABLE) ×1 IMPLANT
GOWN STRL REUS W/TWL LRG LVL3 (GOWN DISPOSABLE) ×3
GOWN STRL REUS W/TWL XL LVL3 (GOWN DISPOSABLE) ×6 IMPLANT
GUIDEWIRE STR DUAL SENSOR (WIRE) ×5 IMPLANT
INFUSOR MANOMETER BAG 3000ML (MISCELLANEOUS) ×3 IMPLANT
INTRODUCER DILATOR DOUBLE (INTRODUCER) ×2 IMPLANT
KIT TURNOVER CYSTO (KITS) ×3 IMPLANT
PACK CYSTO AR (MISCELLANEOUS) ×3 IMPLANT
SET CYSTO W/LG BORE CLAMP LF (SET/KITS/TRAYS/PACK) ×3 IMPLANT
SHEATH URETERAL 12FR 45CM (SHEATH) ×2 IMPLANT
SHEATH URETERAL 12FRX35CM (MISCELLANEOUS) IMPLANT
SOL .9 NS 3000ML IRR  AL (IV SOLUTION) ×2
SOL .9 NS 3000ML IRR AL (IV SOLUTION) ×1
SOL .9 NS 3000ML IRR UROMATIC (IV SOLUTION) ×1 IMPLANT
STENT URET 6FRX24 CONTOUR (STENTS) IMPLANT
STENT URET 6FRX26 CONTOUR (STENTS) ×2 IMPLANT
SURGILUBE 2OZ TUBE FLIPTOP (MISCELLANEOUS) ×3 IMPLANT
VALVE UROSEAL ADJ ENDO (VALVE) ×2 IMPLANT
WATER STERILE IRR 1000ML POUR (IV SOLUTION) ×3 IMPLANT

## 2019-06-15 NOTE — Anesthesia Post-op Follow-up Note (Signed)
Anesthesia QCDR form completed.        

## 2019-06-15 NOTE — Op Note (Signed)
Preoperative diagnosis: Left nephrolithiasis   Postoperative diagnosis: Left nephrolithiasis  Procedure:  1. Cystoscopy 2. Left ureteroscopy and stone removal 3. Ureteroscopic laser lithotripsy 4. Left ureteral stent placement (6FR) 26 cm 5. Left retrograde pyelography with interpretation  Surgeon: Nicki Reaper C. Lexine Jaspers, M.D.  Anesthesia: General  Complications: None  Intraoperative findings:  1.  Left retrograde pyelography post procedure showed no filling defects, stone fragments or contrast extravasation  EBL: Minimal  Specimens: 1. Calculus fragments for analysis   Indication: Corey Perry is a 73 y.o. year old patient with a 10 mm left renal pelvic calculus previously seen by Dr. Junious Silk. After reviewing the management options for treatment, the patient elected to proceed with the above surgical procedure(s). We have discussed the potential benefits and risks of the procedure, side effects of the proposed treatment, the likelihood of the patient achieving the goals of the procedure, and any potential problems that might occur during the procedure or recuperation. Informed consent has been obtained.  Description of procedure:  The patient was taken to the operating room and general anesthesia was induced.  The patient was placed in the dorsal lithotomy position, prepped and draped in the usual sterile fashion, and preoperative antibiotics were administered. A preoperative time-out was performed.   A 21 French cystoscope was lubricated and passed under direct vision.  The urethra was normal in caliber without stricture.  The prostate demonstrated moderate lateral lobe enlargement and moderate bladder neck elevation.  Panendoscopy was performed and the bladder mucosa showed no erythema, solid or papillary lesions.  Attention was directed to the left ureteral orifice and a 0.038 Sensor wire was then advanced up the ureter into the renal pelvis under fluoroscopic guidance.  The  cystoscope was removed and a dual-lumen catheter was placed over the sensor wire and a second sensor wire was placed in a similar fashion.  A 12/14 French ureteral access sheath was placed over the working wire under fluoroscopic guidance without difficulty however the sheath would not advance beyond the mid ureter.  A dual channel digital flexible ureteroscope was placed through the access sheath and advanced into the renal pelvis without difficulty.  The calculus was identified in the renal pelvis.    A 200 micron holmium laser fiber was placed through the ureteroscope and the stone was dusted/fragmented at a setting of 0.2 J and frequency of 40 hz which was subsequently increased to 0.2/53.  After approximately 25% of stone was treated it migrated to a midpole calyx and was further dusted/fragmented using "popcorn technique"  All significantly sized stone fragments were then removed from the collecting system with a zero tip nitinol basket.  Additional fragments in the midpole calyx were further treated until no fragment was left larger than the tip of the 200 m fiber.  Retrograde pyelogram was performed and each calyx was sequentially examined under fluoroscopic guidance and no significant size fragments were identified with the exception of a small fragment in a lower pole calyx which was basketed and removed.  The ureteral access sheath and ureteroscope were removed in tandem and the ureter showed no evidence of injury or perforation.  A 6 FR/26 CM Contour ureteral stent was was placed under fluoroscopic guidance.  The wire was then removed with an adequate stent curl noted in the renal pelvis as well as in the bladder.  The bladder was then emptied and the procedure ended.  The patient appeared to tolerate the procedure well and without complications.  After anesthetic reversal the patient  was transported to the PACU in stable condition.   Plan: Office follow-up approximately 7 days for  cystoscopy with stent removal  Othell Giovanni, MD

## 2019-06-15 NOTE — Telephone Encounter (Signed)
APP MADE ° °

## 2019-06-15 NOTE — Anesthesia Preprocedure Evaluation (Signed)
Anesthesia Evaluation  Patient identified by MRN, date of birth, ID band Patient awake    Reviewed: Allergy & Precautions, H&P , NPO status , Patient's Chart, lab work & pertinent test results  History of Anesthesia Complications Negative for: history of anesthetic complications  Airway Mallampati: III  TM Distance: <3 FB Neck ROM: limited    Dental  (+) Poor Dentition, Chipped   Pulmonary neg shortness of breath, sleep apnea , former smoker,    Pulmonary exam normal        Cardiovascular Exercise Tolerance: Good hypertension, + angina + CAD, + Past MI and + Cardiac Stents  (-) CABG and (-) DOE Normal cardiovascular exam+ dysrhythmias (-) Valvular Problems/Murmurs     Neuro/Psych negative neurological ROS  negative psych ROS   GI/Hepatic negative GI ROS, Neg liver ROS, GERD  Controlled,  Endo/Other  neg diabetesHypothyroidism   Renal/GU Renal disease  negative genitourinary   Musculoskeletal   Abdominal   Peds  Hematology negative hematology ROS (+)   Anesthesia Other Findings Past Medical History: No date: Abnormal white blood cell (WBC) count     Comment: seeing Dr. [name] at [facility] [place] on 10/25 No date: BPH (benign prostatic hypertrophy) No date: Bradycardia No date: CAD (coronary artery disease) No date: Chronic kidney disease No date: Dysrhythmia     Comment: bradycardia - sees Dr. Humphrey Rolls 05/2016: Esophageal cancer ([place]) No date: GERD (gastroesophageal reflux disease) No date: Hyperlipidemia No date: Hypertension No date: Hypothyroidism No date: Sleep apnea     Comment: mild - SS - Care everywhere -04/18/07 No date: Spinal stenosis     Comment: lumbar  Past Surgical History: No date: BACK SURGERY 03/12/2016: CARDIAC CATHETERIZATION Left     Comment: Procedure: Left Heart Cath and Coronary               Angiography;  Surgeon: [name], MD;                Location: [place] [facility];  Service:                Cardiovascular;  Laterality: Left; 03/12/2016: CARDIAC CATHETERIZATION N/A     Comment: Procedure: Coronary Stent Intervention;                Surgeon: Yolonda Kida, MD;  Location: [place] [facility];  Service: Cardiovascular;                Laterality: N/A; 07/13/2015: COLONOSCOPY WITH PROPOFOL N/A     Comment: Procedure: COLONOSCOPY WITH PROPOFOL;                Surgeon: Lucilla Lame, MD;  Location: [place];  Service: Endoscopy;  Laterality:              N/A; 10/05 and 12/05: CORONARY ANGIOPLASTY     Comment: 4 DES placed 06/14/2016: ESOPHAGOGASTRODUODENOSCOPY (EGD) WITH PROPOFOL N/A     Comment: Procedure: ESOPHAGOGASTRODUODENOSCOPY (EGD)               WITH PROPOFOL;  Surgeon: [name], MD;              Location: [facility];  Service: Endoscopy;               Laterality: N/A; No date: HERNIA REPAIR     Comment:  x3 07/13/2015: POLYPECTOMY     Comment: Procedure: POLYPECTOMY;  Surgeon: Lucilla Lame,              MD;  Location: [place];  Service:               Endoscopy;;  BMI    Body Mass Index:  30.57 kg/m      Reproductive/Obstetrics negative OB ROS                             Anesthesia Physical  Anesthesia Plan  ASA: III  Anesthesia Plan: General   Post-op Pain Management:    Induction: Intravenous  PONV Risk Score and Plan: 2 and Ondansetron, Dexamethasone and Treatment may vary due to age or medical condition  Airway Management Planned: Oral ETT  Additional Equipment:   Intra-op Plan: Utilization of Controlled Hypotension per surrgeon request  Post-operative Plan: Extubation in OR  Informed Consent: I have reviewed the patients History and Physical, chart, labs and discussed the procedure including the risks, benefits and alternatives for the proposed anesthesia with the patient or authorized representative who has indicated his/her understanding and  acceptance.     Dental Advisory Given  Plan Discussed with: Anesthesiologist, CRNA and Surgeon  Anesthesia Plan Comments: (Patient has had new stent placed since the last time we saw him  Patient and wife informed that he is higher risk for complications from anesthesia during this procedure due to his medical history including but no limited to MI leading to death today or in the next month.  They voiced understanding. )        Anesthesia Quick Evaluation

## 2019-06-15 NOTE — Discharge Instructions (Signed)
AMBULATORY SURGERY  DISCHARGE INSTRUCTIONS   1) The drugs that you were given will stay in your system until tomorrow so for the next 24 hours you should not:  A) Drive an automobile B) Make any legal decisions C) Drink any alcoholic beverage   2) You may resume regular meals tomorrow.  Today it is better to start with liquids and gradually work up to solid foods.  You may eat anything you prefer, but it is better to start with liquids, then soup and crackers, and gradually work up to solid foods.   3) Please notify your doctor immediately if you have any unusual bleeding, trouble breathing, redness and pain at the surgery site, drainage, fever, or pain not relieved by medication.    4) Additional Instructions:    DISCHARGE INSTRUCTIONS FOR KIDNEY STONE/URETERAL STENT   MEDICATIONS:  1. Resume all your other meds from home.  2.  AZO (over-the-counter) can help with the burning/stinging when you urinate. 3.  Hydrocodone is for moderate/severe pain, Rx was sent to your pharmacy.       4.   Oxybutynin is for stent irritation and urinary symptoms.  Rx was sent to your pharmacy   ACTIVITY:  1. May resume regular activities in 24 hours. 2. No driving while on narcotic pain medications  3. Drink plenty of water  4. Continue to walk at home - you can still get blood clots when you are at home, so keep active, but don't over do it.  5. May return to work/school tomorrow or when you feel ready   BATHING:  1. You can shower.   SIGNS/SYMPTOMS TO CALL:  Please call us if you have a fever greater than 101.5, uncontrolled nausea/vomiting, uncontrolled pain, dizziness, unable to urinate, bloody urine, chest pain, shortness of breath, leg swelling, leg pain, or any other concerns or questions.        Urinary frequency, urgency, bladder spasm and blood in the urine are normal postoperative symptoms.  You can reach [place] at [phone].   FOLLOW-UP:  1. You we will be contacted for an  office visit for stent removal.      Please contact your physician with any problems or Same Day Surgery at [phone], Monday through Friday 6 am to 4 pm, or  at [facility] number at [phone].

## 2019-06-15 NOTE — Interval H&P Note (Signed)
History and Physical Interval Note:  06/15/2019 8:50 AM  Corey Perry  has presented today for surgery, with the diagnosis of LEFT UPJ STONE.  The various methods of treatment have been discussed with the patient and family. After consideration of risks, benefits and other options for treatment, the patient has consented to  Procedure(s): CYSTOSCOPY/URETEROSCOPY/HOLMIUM LASER/STENT PLACEMENT (Left) CYSTOSCOPY WITH RETROGRADE PYELOGRAM (Left) as a surgical intervention.  [name] history has been reviewed, patient examined, no change in status, stable for surgery.  I have reviewed the patient's chart and labs.  Questions were answered to the patient's satisfaction.     Haivana Nakya

## 2019-06-15 NOTE — Telephone Encounter (Signed)
-----   Message from Abbie Sons, MD sent at 06/15/2019 11:05 AM EDT ----- Regarding: Stent removal Please schedule cystoscopy with stent removal in approximately 1 week.  Thanks

## 2019-06-15 NOTE — H&P (Signed)
06/15/2019 8:45 AM   Corey Perry Jul 28, 1946 [id number]  Referring provider: No referring provider defined for this encounter.  No chief complaint on file.   HPI: Corey Perry is a 73 y.o. male with a 10 mm left renal pelvic calculus.  He has had intermittent gross hematuria and flank pain.  He has been seen by Dr. Junious Silk and was scheduled for ureteroscopic stone removal.  He presents today for definitive stone treatment and has no complaints.   PMH: Past Medical History:  Diagnosis Date   Abnormal white blood cell (WBC) count    seeing Dr. [name] at [facility] [place] on 10/25   Atrial fibrillation [facility])    BPH (benign prostatic hypertrophy)    Bradycardia    CAD (coronary artery disease)    Dysrhythmia    bradycardia - sees Dr. Humphrey Rolls   Esophageal cancer [facility]) 05/2016   GERD (gastroesophageal reflux disease)    History of kidney stones    Hyperlipidemia    Hypertension    Hypothyroidism    Myocardial infarction ([place]) 2005   S/P angioplasty with stent 2005   x 4 vessels   Sleep apnea    mild-NO CPAP   Spinal stenosis    lumbar    Surgical History: Past Surgical History:  Procedure Laterality Date   BACK SURGERY     CARDIAC CATHETERIZATION Left 03/12/2016   Procedure: Left Heart Cath and Coronary Angiography;  Surgeon: [name], MD;  Location: [place] [facility];  Service: Cardiovascular;  Laterality: Left;   CARDIAC CATHETERIZATION N/A 03/12/2016   Procedure: Coronary Stent Intervention;  Surgeon: Yolonda Kida, MD;  Location: [place] [facility];  Service: Cardiovascular;  Laterality: N/A;   COLONOSCOPY WITH PROPOFOL N/A 07/13/2015   Procedure: COLONOSCOPY WITH PROPOFOL;  Surgeon: Lucilla Lame, MD;  Location: [place];  Service: Endoscopy;  Laterality: N/A;   CORONARY ANGIOPLASTY  10/05 and 12/05   4 DES placed   ESOPHAGECTOMY  10/07/2016   @ [facility]   ESOPHAGOGASTRODUODENOSCOPY (EGD) WITH PROPOFOL N/A 06/14/2016   Procedure: ESOPHAGOGASTRODUODENOSCOPY (EGD) WITH PROPOFOL;  Surgeon: [name], MD;  Location: [facility] END[facility]COPY;  Service: Endoscopy;  Laterality: N/A;   HERNIA REPAIR Bilateral    x3   INGUINAL HERNIA REPAIR Right 08/28/2017   Large PerFix plug, recurrent hernia;  Surgeon: Robert Bellow, MD;  Location: [facility];  Service: General;  Laterality: Right;  recurrent   POLYPECTOMY  07/13/2015   Procedure: POLYPECTOMY;  Surgeon: Lucilla Lame, MD;  Location: [place];  Service: Endoscopy;;   PORT-A-CATH REMOVAL N/A 01/16/2017   Procedure: REMOVAL PORT-A-CATH;  Surgeon: Olean Ree, MD;  Location: [facility] OR[facility];  Service: General;  Laterality: N/A;   PORTACATH PLACEMENT N/A 07/08/2016   Procedure: INSERTION PORT-A-CATH;  Surgeon: Olean Ree, MD;  Location: [facility] ORS;  Service: General;  Laterality: N/A;    Home Medications:  acetaminophen 500 MG tablet Commonly known as: TYLENOL Take 500 mg by mouth every 6 (six) hours as needed for mild pain or moderate pain.   aspirin 81 MG tablet Take 81 mg by mouth daily. AM   atorvastatin 40 MG tablet Commonly known as: LIPITOR Take 1 tablet (40 mg total) by mouth daily.   clopidogrel 75 MG tablet Commonly known as: PLAVIX Take 1 tablet (75 mg total) by mouth daily.   HYDROcodone-acetaminophen 5-325 MG tablet Commonly known as: Norco Take 1 tablet by mouth every 4 (four) hours as needed for moderate pain.   isosorbide mononitrate  30 MG 24 hr tablet Commonly known as: IMDUR Take 30 mg by mouth daily. Takes at night   levothyroxine 50 MCG tablet Commonly known as: SYNTHROID Take 1 tablet (50 mcg total) by mouth daily.   lisinopril 10 MG tablet Commonly known as: ZESTRIL Take 1 tablet (10 mg total) by mouth every morning.   metoprolol succinate 25 MG 24 hr tablet Commonly known as: TOPROL-XL Take 25 mg by mouth daily.   multivitamin-iron-minerals-folic acid chewable tablet Chew 1 tablet by mouth daily.     nitroGLYCERIN 0.4 MG SL tablet Commonly known as: NITROSTAT Place 1 tablet under the tongue every 5 (five) minutes as needed for chest pain.   ondansetron 4 MG tablet Commonly known as: Zofran Take 1 tablet (4 mg total) by mouth daily as needed for nausea or vomiting.   pantoprazole 20 MG tablet Commonly known as: PROTONIX Take 1 tablet (20 mg total) by mouth daily.   tamsulosin 0.4 MG Caps capsule Commonly known as: FLOMAX Take 1 capsule (0.4 mg total) by mouth daily after supper.     Allergies: No Known Allergies  Family History: Family History  Problem Relation Age of Onset   Hypertension Mother    Dementia Mother    Heart disease Father    Hypertension Father    Kidney disease Father    Hypertension Brother    Heart disease Brother    Diabetes Son    Hypertension Son    Hyperlipidemia Son    Hyperlipidemia Daughter    Hypertension Daughter     Social History:  reports that he quit smoking about 14 years ago. His smoking use included cigarettes. He has a 40.00 pack-year smoking history. He has quit using smokeless tobacco.  His smokeless tobacco use included chew. He reports current alcohol use of about 1.0 standard drinks of alcohol per week. He reports that he does not use drugs.  ROS: No significant change from 05/12/2019  Physical Exam: BP 128/81    Pulse 65    Temp 98.1 F (36.7 C) (Temporal)    Resp 20    Ht 5\' 9"  (1.753 m)    Wt 64.9 kg    SpO2 98%    BMI 21.12 kg/m   Constitutional:  Alert and oriented, No acute distress. HEENT: [place] AT, moist mucus membranes.  Trachea midline, no masses. Cardiovascular: No clubbing, cyanosis, or edema.  RRR Respiratory: Normal respiratory effort, no increased work of breathing.  Clear GI: Abdomen is soft, nontender, nondistended, no abdominal masses GU: No CVA tenderness Lymph: No cervical or inguinal lymphadenopathy. Skin: No rashes, bruises or suspicious lesions. Neurologic: Grossly intact, no focal  deficits, moving all 4 extremities. Psychiatric: Normal mood and affect.  Laboratory Data: Lab Results  Component Value Date   WBC 3.7 03/15/2019   HGB 13.0 03/15/2019   HCT 39.2 03/15/2019   MCV 98 (H) 03/15/2019   PLT 130 (L) 03/15/2019    Lab Results  Component Value Date   CREATININE 1.24 03/15/2019     Pertinent Imaging: Imaging was personally reviewed Results for orders placed during the hospital encounter of 04/01/19  Abdomen 1 view (KUB)   Narrative CLINICAL DATA:  Follow-up nephrolithiasis.  EXAM: ABDOMEN - 1 VIEW  COMPARISON:  Renal ultrasound dated 11/06/2018 and abdomen and pelvis CT dated 10/18/2018. Abdomen radiograph dated 11/06/2018.  FINDINGS: A normal bowel gas pattern is again demonstrated. A left renal calculus is also again demonstrated, measuring 9 mm. No other urinary tract calculi are visible. Mild  atheromatous arterial calcifications. Lumbar spine degenerative changes.  IMPRESSION: 9 mm left renal calculus.   Electronically Signed   By: Claudie Revering M.D.   On: 04/02/2019 08:31    No results found for this or any previous visit. Results for orders placed in visit on 04/01/19  CT HEMATURIA WORKUP   Narrative CLINICAL DATA:  Right flank pain and hematuria.  EXAM: CT ABDOMEN AND PELVIS WITHOUT AND WITH CONTRAST  TECHNIQUE: Multidetector CT imaging of the abdomen and pelvis was performed following the standard protocol before and following the bolus administration of intravenous contrast.  CONTRAST:  163mL OMNIPAQUE IOHEXOL 300 MG/ML  SOLN  COMPARISON:  10/18/2018  FINDINGS: Lower chest: Right lower lobe scarring. 11 mm subpleural spiculated nodule is identified in the posterior right lower lobe. The previously 8 mm. Additional, stable lateral right lower lobe and right middle lobe lung nodules measuring up to 5 mm.  Hepatobiliary: No focal liver abnormality is seen. No gallstones, gallbladder wall thickening, or biliary  dilatation.  Pancreas: Unremarkable. No pancreatic ductal dilatation or surrounding inflammatory changes.  Spleen: Spleen is enlarged measuring 13 cm in len dome days gth. No focal splenic lesion identified.  Adrenals/Urinary Tract: Normal appearance of the right adrenal gland. A normal left adrenal gland is not visualized on today's study.  Left kidney cyst measures 1.3 cm. There is a stone identified within the central portion of the left renal pelvis which measures 11 mm in maximum dimension. There is asymmetric urothelial thickening and enhancement of the left renal pelvis. No hydronephrosis. No ureteral calculi or bladder calculi identified.  Stomach/Bowel: There are postsurgical changes from prior esophagectomy and gastric no pelvic or inguinal adenopathy. Interposition. There is a heterogeneously enhancing soft tissue mass just below the level of the diaphragmatic hiatus and extending to involve the posterior wall of the gastric antrum. This measures 5.7 cm cranial caudal, 3.7 cm transverse, and 3.3 cm AP. The mass completely encases the celiac artery and partially encases the proximal superior mesenteric artery, image 14/9 and image 18/9. The small and large bowel loops are unremarkable. The tumor extends into the left retroperitoneum and engulfs the left adrenal gland, image 16/9.  Vascular/Lymphatic: Aortic atherosclerosis. The infrarenal abdominal aorta measures 3 cm in maximum AP dimension. Retroperitoneal adenopathy identified. Index aortocaval node measures 1.5 cm, image 26/9.  Reproductive: Prostate is unremarkable.  Other: There is a small volume of ascites identified within the pelvis. No convincing evidence for peritoneal nodularity.  Musculoskeletal: No acute or significant osseous findings.  IMPRESSION: 1. Imaging findings are highly concerning for esophageal carcinoma tumor recurrence within the upper abdomen. There is a retroperitoneal mass involving  the posterior wall of the gastric antrum and encasing the celiac artery and superior mesenteric artery as well as the left adrenal gland. Recommend further restaging with PET-CT. 2. Enlarging spiculated nodule in the posterior right lower lobe may represent a primary lung neoplasm or focus of metastatic disease. 3. Left renal pelvis stone without hydronephrosis. There is asymmetric urothelial thickening and enhancement of the left renal pelvis. 4. Small volume of ascites. New from previous exam. No definite peritoneal nodularity identified. 5. These results will be called to the ordering clinician or representative by the Radiologist Assistant, and communication documented in the PACS or zVision Dashboard.   Electronically Signed   By: Kerby Moors M.D.   On: 04/22/2019 12:53      Assessment & Plan:   73 y.o. male with a 10 mm left renal pelvic calculus intermittently symptomatic  with gross hematuria.  He presents today for left ureteroscopic stone removal with laser lithotripsy.  The indications and nature of the planned procedure were discussed as well as the potential  benefits and expected outcome.  Alternatives have been discussed in detail. The most common complications and side effects were discussed including but not limited to infection/sepsis; blood loss; damage to urethra, bladder, ureter, kidney; need for multiple surgeries; need for prolonged stent placement as well as general anesthesia risks. Although uncommon he was also informed of the possibility that the calculus may not be able to be treated due to inability to obtain access to the upper ureter. In that event he would require stent placement and a follow-up procedure after a period of stent dilation. All of his questions were answered and he desires to proceed.     Abbie Sons, [place] [address], [place], [place] [address] [phone]

## 2019-06-15 NOTE — Anesthesia Procedure Notes (Signed)
Procedure Name: Intubation Date/Time: 06/15/2019 9:17 AM Performed by: Jonna Clark, CRNA Pre-anesthesia Checklist: Patient identified, Patient being monitored, Timeout performed, Emergency Drugs available and Suction available Patient Re-evaluated:Patient Re-evaluated prior to induction Oxygen Delivery Method: Circle system utilized Preoxygenation: Pre-oxygenation with 100% oxygen Induction Type: IV induction Ventilation: Mask ventilation without difficulty Laryngoscope Size: Mac and 4 Grade View: Grade I Tube type: Oral Tube size: 7.5 mm Number of attempts: 1 Airway Equipment and Method: Stylet Placement Confirmation: ETT inserted through vocal cords under direct vision,  positive ETCO2 and breath sounds checked- equal and bilateral Secured at: 21 cm Tube secured with: Tape Dental Injury: Teeth and Oropharynx as per pre-operative assessment

## 2019-06-15 NOTE — Transfer of Care (Signed)
Immediate Anesthesia Transfer of Care Note  Patient: Corey Perry  Procedure(s) Performed: CYSTOSCOPY/URETEROSCOPY/HOLMIUM LASER/STENT PLACEMENT (Left Ureter) CYSTOSCOPY WITH RETROGRADE PYELOGRAM (Left Ureter)  Patient Location: PACU  Anesthesia Type:General  Level of Consciousness: awake, alert , oriented and patient cooperative  Airway & Oxygen Therapy: Patient Spontanous Breathing and Patient connected to face mask oxygen  Post-op Assessment: Report given to RN and Post -op Vital signs reviewed and stable  Post vital signs: Reviewed and stable  Last Vitals:  Vitals Value Taken Time  BP 129/105 06/15/19 1043  Temp    Pulse 69 06/15/19 1045  Resp 19 06/15/19 1045  SpO2 100 % 06/15/19 1045  Vitals shown include unvalidated device data.  Last Pain:  Vitals:   06/15/19 0808  TempSrc: Temporal  PainSc: 0-No pain      Patients Stated Pain Goal: 0 (74/08/14 4818)  Complications: No apparent anesthesia complications

## 2019-06-16 NOTE — Anesthesia Postprocedure Evaluation (Signed)
Anesthesia Post Note  Patient: Corey Perry  Procedure(s) Performed: CYSTOSCOPY/URETEROSCOPY/HOLMIUM LASER/STENT PLACEMENT (Left Ureter) CYSTOSCOPY WITH RETROGRADE PYELOGRAM (Left Ureter)  Patient location during evaluation: PACU Anesthesia Type: General Level of consciousness: awake and alert Pain management: pain level controlled Vital Signs Assessment: post-procedure vital signs reviewed and stable Respiratory status: spontaneous breathing, nonlabored ventilation, respiratory function stable and patient connected to nasal cannula oxygen Cardiovascular status: blood pressure returned to baseline and stable Postop Assessment: no apparent nausea or vomiting Anesthetic complications: no     Last Vitals:  Vitals:   06/15/19 1136 06/15/19 1150  BP: (!) 153/83 (!) 158/88  Pulse: 65 (!) 58  Resp: 18 18  Temp: 36.4 C   SpO2: 100% 100%    Last Pain:  Vitals:   06/16/19 0838  TempSrc:   PainSc: 0-No pain                 Martha Clan

## 2019-06-22 LAB — CALCULI, WITH PHOTOGRAPH (CLINICAL LAB)
Calcium Oxalate Monohydrate: 100 %
Weight Calculi: 33 mg

## 2019-06-23 ENCOUNTER — Encounter: Payer: Self-pay | Admitting: Urology

## 2019-06-23 ENCOUNTER — Other Ambulatory Visit: Payer: Self-pay

## 2019-06-23 ENCOUNTER — Ambulatory Visit (INDEPENDENT_AMBULATORY_CARE_PROVIDER_SITE_OTHER): Payer: Medicare Other | Admitting: Urology

## 2019-06-23 VITALS — BP 135/77 | HR 69 | Ht 69.0 in | Wt 148.4 lb

## 2019-06-23 DIAGNOSIS — Z9889 Other specified postprocedural states: Secondary | ICD-10-CM

## 2019-06-23 DIAGNOSIS — Z87442 Personal history of urinary calculi: Secondary | ICD-10-CM

## 2019-06-23 DIAGNOSIS — N200 Calculus of kidney: Secondary | ICD-10-CM

## 2019-06-23 DIAGNOSIS — R3129 Other microscopic hematuria: Secondary | ICD-10-CM | POA: Diagnosis not present

## 2019-06-23 LAB — URINALYSIS, COMPLETE
Bilirubin, UA: NEGATIVE
Nitrite, UA: POSITIVE — AB
Specific Gravity, UA: 1.015 (ref 1.005–1.030)
Urobilinogen, Ur: 1 mg/dL (ref 0.2–1.0)
pH, UA: 5 (ref 5.0–7.5)

## 2019-06-23 LAB — MICROSCOPIC EXAMINATION: RBC, Urine: >30 /hpf — AB (ref 0–2)

## 2019-06-23 MED ORDER — CIPROFLOXACIN HCL 500 MG PO TABS
500.0000 mg | ORAL_TABLET | Freq: Once | ORAL | Status: AC
  Administered 2019-06-23: 500 mg via ORAL

## 2019-06-23 MED ORDER — LEVOFLOXACIN 250 MG PO TABS: 250.0000 mg | ORAL_TABLET | Freq: Every day | ORAL | 0 refills | Status: DC

## 2019-06-23 MED ORDER — LIDOCAINE HCL URETHRAL/MUCOSAL 2 % EX GEL
1.0000 "application " | Freq: Once | CUTANEOUS | Status: AC
  Administered 2019-06-23: 1 via URETHRAL

## 2019-06-23 NOTE — Progress Notes (Signed)
Indications: Patient is 73 y.o., male status post ureteroscopic removal of a 10 mm left renal pelvic calculus on 06/15/2019.  He had no postoperative problems and has no complaints today.  Denies fever, chills or dysuria.  The patient is presenting today for stent removal.  Procedure:  Flexible Cystoscopy with stent removal (32023)  Timeout was performed and the correct patient, procedure and participants were identified.    Description:  The patient was prepped and draped in the usual sterile fashion. Flexible cystosopy was performed.  The stent was visualized, grasped, and removed intact without difficulty. The patient tolerated the procedure well.  A single dose of oral antibiotics was given.  His urinalysis did show pyuria and was nitrite positive most likely representing colonization.  Rx for an additional 2 days antibiotics was sent to pharmacy.  Complications:  None  Plan: He is a recurrent stone former and I recommended pursuing a metabolic evaluation to include blood work and a 24-hour urine study.  Follow-up 6 months with a KUB.   Dartagnan Giovanni, MD

## 2019-06-26 LAB — CULTURE, URINE COMPREHENSIVE

## 2019-06-30 ENCOUNTER — Ambulatory Visit (INDEPENDENT_AMBULATORY_CARE_PROVIDER_SITE_OTHER): Payer: Medicare Other

## 2019-06-30 ENCOUNTER — Other Ambulatory Visit: Payer: Self-pay

## 2019-06-30 DIAGNOSIS — Z23 Encounter for immunization: Secondary | ICD-10-CM | POA: Diagnosis not present

## 2019-07-15 HISTORY — PX: KIDNEY STONE SURGERY: SHX686

## 2019-07-23 ENCOUNTER — Encounter: Payer: Self-pay | Admitting: Family Medicine

## 2019-07-23 ENCOUNTER — Ambulatory Visit (INDEPENDENT_AMBULATORY_CARE_PROVIDER_SITE_OTHER): Payer: Medicare Other | Admitting: Family Medicine

## 2019-07-23 ENCOUNTER — Other Ambulatory Visit: Payer: Self-pay

## 2019-07-23 VITALS — BP 106/66 | HR 62 | Temp 98.0°F

## 2019-07-23 DIAGNOSIS — I2510 Atherosclerotic heart disease of native coronary artery without angina pectoris: Secondary | ICD-10-CM | POA: Diagnosis not present

## 2019-07-23 DIAGNOSIS — I10 Essential (primary) hypertension: Secondary | ICD-10-CM | POA: Diagnosis not present

## 2019-07-23 DIAGNOSIS — I2583 Coronary atherosclerosis due to lipid rich plaque: Secondary | ICD-10-CM

## 2019-07-23 NOTE — Progress Notes (Signed)
BP 106/66   Pulse 62   Temp 98 F (36.7 C) (Oral)   SpO2 98%    Subjective:    Patient ID: Corey Perry, male    DOB: 16-Jul-1946, 73 y.o.   MRN: [id number]  HPI: Corey Perry is a 73 y.o. male  Chief Complaint  Patient presents with  . Hypertension    pt states he has been getting elevated BP readings at home    Here today concerned about some elevated blood pressure readings he's gotten at home today. BPs 160s/80s when checked. Feeling well, denies CP, SOB, HAs, dizziness, visual changes. Taking his medicine as prescribed without side effects. No major new stressors, diet or activity changes, pain issues currently.   Relevant past medical, surgical, family and social history reviewed and updated as indicated. Interim medical history since our last visit reviewed. Allergies and medications reviewed and updated.  Review of Systems  Per HPI unless specifically indicated above     Objective:    BP 106/66   Pulse 62   Temp 98 F (36.7 C) (Oral)   SpO2 98%   Wt Readings from Last 3 Encounters:  06/23/19 148 lb 6.4 oz (67.3 kg)  06/15/19 143 lb (64.9 kg)  06/10/19 153 lb (69.4 kg)    Physical Exam Vitals signs and nursing note reviewed.  Constitutional:      Appearance: Normal appearance.  HENT:     Head: Atraumatic.  Eyes:     Extraocular Movements: Extraocular movements intact.     Conjunctiva/sclera: Conjunctivae normal.  Neck:     Musculoskeletal: Normal range of motion and neck supple.  Cardiovascular:     Rate and Rhythm: Normal rate.  Pulmonary:     Effort: Pulmonary effort is normal.     Breath sounds: Normal breath sounds.  Musculoskeletal: Normal range of motion.  Skin:    General: Skin is warm and dry.  Neurological:     General: No focal deficit present.     Mental Status: He is oriented to person, place, and time.  Psychiatric:        Mood and Affect: Mood normal.        Thought Content: Thought content normal.        Judgment:  Judgment normal.     Results for orders placed or performed in visit on 06/23/19  CULTURE, URINE COMPREHENSIVE   Specimen: Urine   UR  Result Value Ref Range   Urine Culture, Comprehensive Final report    Organism ID, Bacteria Comment   Microscopic Examination   URINE  Result Value Ref Range   WBC, UA 11-30 (A) 0 - 5 /hpf   RBC >30 (A) 0 - 2 /hpf   Epithelial Cells (non renal) 0-10 0 - 10 /hpf   Renal Epithel, UA 0-10 (A) None seen /hpf   Crystals Present (A) N/A   Crystal Type Amorphous Sediment N/A   Bacteria, UA Moderate (A) None seen/Few  Urinalysis, Complete  Result Value Ref Range   Specific Gravity, UA 1.015 1.005 - 1.030   pH, UA 5.0 5.0 - 7.5   Color, UA Brown (A) Yellow   Appearance Ur Cloudy (A) Clear   Leukocytes,UA 3+ (A) Negative   Protein,UA 3+ (A) Negative/Trace   Glucose, UA Trace (A) Negative   Ketones, UA Trace (A) Negative   RBC, UA 3+ (A) Negative   Bilirubin, UA Negative Negative   Urobilinogen, Ur 1.0 0.2 - 1.0 mg/dL   Nitrite, UA Positive (  A) Negative   Microscopic Examination See below:       Assessment & Plan:   Problem List Items Addressed This Visit      Cardiovascular and Mediastinum   Hypertension - Primary    BP normalized and even on lower side today. Continue close monitoring, will not change regimen today given return to normal range. Discussed bringing home monitor to next appt to check against manual reading to ensure accuracy. Continue close home monitoring, f/u if remaining elevated or becoming symptomatic          Follow up plan: Return for as scheduled.

## 2019-07-28 NOTE — Assessment & Plan Note (Signed)
BP normalized and even on lower side today. Continue close monitoring, will not change regimen today given return to normal range. Discussed bringing home monitor to next appt to check against manual reading to ensure accuracy. Continue close home monitoring, f/u if remaining elevated or becoming symptomatic

## 2019-08-02 DIAGNOSIS — K769 Liver disease, unspecified: Secondary | ICD-10-CM | POA: Diagnosis not present

## 2019-08-02 DIAGNOSIS — C159 Malignant neoplasm of esophagus, unspecified: Secondary | ICD-10-CM | POA: Diagnosis not present

## 2019-08-02 DIAGNOSIS — R918 Other nonspecific abnormal finding of lung field: Secondary | ICD-10-CM | POA: Diagnosis not present

## 2019-08-03 DIAGNOSIS — Z20828 Contact with and (suspected) exposure to other viral communicable diseases: Secondary | ICD-10-CM | POA: Diagnosis not present

## 2019-08-03 DIAGNOSIS — Z01818 Encounter for other preprocedural examination: Secondary | ICD-10-CM | POA: Diagnosis not present

## 2019-08-06 DIAGNOSIS — C159 Malignant neoplasm of esophagus, unspecified: Secondary | ICD-10-CM | POA: Diagnosis not present

## 2019-08-06 DIAGNOSIS — C155 Malignant neoplasm of lower third of esophagus: Secondary | ICD-10-CM | POA: Diagnosis not present

## 2019-08-06 DIAGNOSIS — R9431 Abnormal electrocardiogram [ECG] [EKG]: Secondary | ICD-10-CM | POA: Diagnosis not present

## 2019-08-06 DIAGNOSIS — C153 Malignant neoplasm of upper third of esophagus: Secondary | ICD-10-CM | POA: Diagnosis not present

## 2019-08-06 DIAGNOSIS — R351 Nocturia: Secondary | ICD-10-CM | POA: Diagnosis not present

## 2019-08-06 DIAGNOSIS — K222 Esophageal obstruction: Secondary | ICD-10-CM | POA: Diagnosis not present

## 2019-08-06 DIAGNOSIS — Z9221 Personal history of antineoplastic chemotherapy: Secondary | ICD-10-CM | POA: Diagnosis not present

## 2019-08-06 DIAGNOSIS — N401 Enlarged prostate with lower urinary tract symptoms: Secondary | ICD-10-CM | POA: Diagnosis not present

## 2019-08-06 DIAGNOSIS — Z7902 Long term (current) use of antithrombotics/antiplatelets: Secondary | ICD-10-CM | POA: Diagnosis not present

## 2019-08-06 DIAGNOSIS — R590 Localized enlarged lymph nodes: Secondary | ICD-10-CM | POA: Diagnosis not present

## 2019-08-06 DIAGNOSIS — Z87891 Personal history of nicotine dependence: Secondary | ICD-10-CM | POA: Diagnosis not present

## 2019-08-06 DIAGNOSIS — I129 Hypertensive chronic kidney disease with stage 1 through stage 4 chronic kidney disease, or unspecified chronic kidney disease: Secondary | ICD-10-CM | POA: Diagnosis not present

## 2019-08-06 DIAGNOSIS — Z923 Personal history of irradiation: Secondary | ICD-10-CM | POA: Diagnosis not present

## 2019-08-06 DIAGNOSIS — K2100 Gastro-esophageal reflux disease with esophagitis, without bleeding: Secondary | ICD-10-CM | POA: Diagnosis not present

## 2019-08-06 DIAGNOSIS — Z7982 Long term (current) use of aspirin: Secondary | ICD-10-CM | POA: Diagnosis not present

## 2019-08-06 DIAGNOSIS — D649 Anemia, unspecified: Secondary | ICD-10-CM | POA: Diagnosis not present

## 2019-08-06 DIAGNOSIS — N183 Chronic kidney disease, stage 3 unspecified: Secondary | ICD-10-CM | POA: Diagnosis not present

## 2019-08-06 DIAGNOSIS — Z9049 Acquired absence of other specified parts of digestive tract: Secondary | ICD-10-CM | POA: Diagnosis not present

## 2019-08-06 DIAGNOSIS — Z79899 Other long term (current) drug therapy: Secondary | ICD-10-CM | POA: Diagnosis not present

## 2019-08-06 DIAGNOSIS — M1990 Unspecified osteoarthritis, unspecified site: Secondary | ICD-10-CM | POA: Diagnosis not present

## 2019-08-06 DIAGNOSIS — E039 Hypothyroidism, unspecified: Secondary | ICD-10-CM | POA: Diagnosis not present

## 2019-08-06 DIAGNOSIS — I2510 Atherosclerotic heart disease of native coronary artery without angina pectoris: Secondary | ICD-10-CM | POA: Diagnosis not present

## 2019-08-06 DIAGNOSIS — I480 Paroxysmal atrial fibrillation: Secondary | ICD-10-CM | POA: Diagnosis not present

## 2019-08-22 NOTE — Progress Notes (Signed)
[place]  [phone]) [phone] [phone]) [phone]  ID: [name] OB: 1945/10/14  MR#: [id number]  XNT#:[id number]  Patient Care Team: [name], MD as PCP - General (Family Medicine) [name], MD as PCP - Family Medicine (Family Medicine) [name], MD as Consulting Physician (Oncology) [name], MD as Referring Physician [name], MD as Consulting Physician (Cardiology)  CHIEF COMPLAINT: Recurrent adenocarcinoma of the lower third of the esophagus.  INTERVAL HISTORY: Patient was last seen in clinic in August 2020.  He is referred back from [facility] oncology after recent biopsy revealed recurrent disease.  He has lost a significant amount of weight in the interim.  He denies dysphagia, but continues to have difficulty swallowing.  He has no neurologic complaints.  He denies any recent fevers or illnesses.  He denies any chest pain, shortness of breath, cough, or hemoptysis.  He denies any nausea, vomiting, constipation, or diarrhea.  He has no melena or hematochezia.  He has no urinary complaints.  Patient offers no further specific complaints today.   REVIEW OF SYSTEMS:   Review of Systems  Constitutional: Positive for malaise/fatigue and weight loss. Negative for fever.  HENT: Negative.   Respiratory: Negative.  Negative for cough and shortness of breath.   Cardiovascular: Negative.  Negative for chest pain and leg swelling.  Gastrointestinal: Negative.  Negative for abdominal pain, blood in stool, constipation, diarrhea, melena, nausea and vomiting.  Genitourinary: Negative.  Negative for frequency and urgency.  Musculoskeletal: Negative.  Negative for back pain.  Skin: Negative.  Negative for rash.  Neurological: Positive for weakness. Negative for tingling, focal weakness and headaches.  Psychiatric/Behavioral: Negative.  The patient is not nervous/anxious and does not have insomnia.     As per HPI. Otherwise, a  complete review of systems is negative.  PAST MEDICAL HISTORY: Past Medical History:  Diagnosis Date  . Abnormal white blood cell (WBC) count    seeing Dr. [name] at [place] on 10/25  . Atrial fibrillation ([place])   . BPH (benign prostatic hypertrophy)   . Bradycardia   . CAD (coronary artery disease)   . Dysrhythmia    bradycardia - sees Dr. Humphrey Rolls  . Esophageal cancer ([place]) 05/2016  . GERD (gastroesophageal reflux disease)   . History of kidney stones   . Hyperlipidemia   . Hypertension   . Hypothyroidism   . Kidney stone   . Myocardial infarction ([place]) 2005  . S/P angioplasty with stent 2005   x 4 vessels  . Sleep apnea    mild-NO CPAP  . Spinal stenosis    lumbar    PAST SURGICAL HISTORY: Past Surgical History:  Procedure Laterality Date  . BACK SURGERY    . CARDIAC CATHETERIZATION Left 03/12/2016   Procedure: Left Heart Cath and Coronary Angiography;  Surgeon: [name], MD;  Location: [facility];  Service: Cardiovascular;  Laterality: Left;  . CARDIAC CATHETERIZATION N/A 03/12/2016   Procedure: Coronary Stent Intervention;  Surgeon: Yolonda Kida, MD;  Location: [facility];  Service: Cardiovascular;  Laterality: N/A;  . COLONOSCOPY WITH PROPOFOL N/A 07/13/2015   Procedure: COLONOSCOPY WITH PROPOFOL;  Surgeon: Lucilla Lame, MD;  Location: [facility];  Service: Endoscopy;  Laterality: N/A;  . CORONARY ANGIOPLASTY  10/05 and 12/05   4 DES placed  . CYSTOSCOPY W/ RETROGRADES Left 06/15/2019   Procedure: CYSTOSCOPY WITH RETROGRADE PYELOGRAM;  Surgeon: Abbie Sons, MD;  Location: [facility];  Service: Urology;  Laterality: Left;  . CYSTOSCOPY/URETEROSCOPY/HOLMIUM LASER/STENT PLACEMENT Left 06/15/2019   Procedure: CYSTOSCOPY/URETEROSCOPY/HOLMIUM LASER/STENT PLACEMENT;  Surgeon: Abbie Sons, MD;  Location: [facility];  Service: Urology;  Laterality: Left;  . ESOPHAGECTOMY  10/07/2016   @ [facility]  . ESOPHAGOGASTRODUODENOSCOPY (EGD) WITH  PROPOFOL N/A 06/14/2016   Procedure: ESOPHAGOGASTRODUODENOSCOPY (EGD) WITH PROPOFOL;  Surgeon: [name], MD;  Location: [facility];  Service: Endoscopy;  Laterality: N/A;  . HERNIA REPAIR Bilateral    x3  . INGUINAL HERNIA REPAIR Right 08/28/2017   Large PerFix plug, recurrent hernia;  Surgeon: Robert Bellow, MD;  Location: [facility];  Service: General;  Laterality: Right;  recurrent  . KIDNEY STONE SURGERY  07/15/2019  . POLYPECTOMY  07/13/2015   Procedure: POLYPECTOMY;  Surgeon: Lucilla Lame, MD;  Location: [place];  Service: Endoscopy;;  . PORT-A-CATH REMOVAL N/A 01/16/2017   Procedure: REMOVAL PORT-A-CATH;  Surgeon: Olean Ree, MD;  Location: [facility];  Service: General;  Laterality: N/A;  . PORTACATH PLACEMENT N/A 07/08/2016   Procedure: INSERTION PORT-A-CATH;  Surgeon: Olean Ree, MD;  Location: [facility];  Service: General;  Laterality: N/A;    FAMILY HISTORY Family History  Problem Relation Age of Onset  . Hypertension Mother   . Dementia Mother   . Heart disease Father   . Hypertension Father   . Kidney disease Father   . Hypertension Brother   . Heart disease Brother   . Diabetes Son   . Hypertension Son   . Hyperlipidemia Son   . Hyperlipidemia Daughter   . Hypertension Daughter        ADVANCED DIRECTIVES:    HEALTH MAINTENANCE: Social History   Tobacco Use  . Smoking status: Former Smoker    Packs/day: 1.00    Years: 40.00    Pack years: 40.00    Types: Cigarettes    Quit date: 07/16/2004    Years since quitting: 15.1  . Smokeless tobacco: Former Systems developer    Types: Chew  Substance Use Topics  . Alcohol use: Yes    Alcohol/week: 1.0 standard drinks    Types: 1 Cans of beer per week  . Drug use: No     Colonoscopy:  PAP:  Bone density:  Lipid panel:  No Known Allergies  Current Outpatient Medications  Medication Sig Dispense Refill  . acetaminophen (TYLENOL) 500 MG tablet Take 500 mg by mouth every 6 (six) hours as  needed for mild pain or moderate pain.    Marland Kitchen aspirin 81 MG tablet Take 81 mg by mouth daily. AM    . atorvastatin (LIPITOR) 40 MG tablet Take 1 tablet (40 mg total) by mouth daily. 90 tablet 4  . clopidogrel (PLAVIX) 75 MG tablet Take 1 tablet (75 mg total) by mouth daily. 90 tablet 2  . isosorbide mononitrate (IMDUR) 30 MG 24 hr tablet Take 30 mg by mouth daily. Takes at night  5  . levothyroxine (SYNTHROID, LEVOTHROID) 50 MCG tablet Take 1 tablet (50 mcg total) by mouth daily. 90 tablet 4  . lisinopril (PRINIVIL,ZESTRIL) 10 MG tablet Take 1 tablet (10 mg total) by mouth every morning. 90 tablet 4  . metoprolol succinate (TOPROL-XL) 25 MG 24 hr tablet Take 25 mg by mouth daily.    . multivitamin-iron-minerals-folic acid (CENTRUM) chewable tablet Chew 1 tablet by mouth daily.    . nitroGLYCERIN (NITROSTAT) 0.4 MG SL tablet Place 1 tablet under the tongue every 5 (five) minutes as needed for chest pain.   98  .  pantoprazole (PROTONIX) 20 MG tablet Take 1 tablet (20 mg total) by mouth daily. 90 tablet 4  . lidocaine-prilocaine (EMLA) cream Apply to affected area once 30 g 3  . ondansetron (ZOFRAN) 8 MG tablet Take 1 tablet (8 mg total) by mouth 2 (two) times daily as needed for refractory nausea / vomiting. Start on day 3 after chemotherapy. 60 tablet 2  . prochlorperazine (COMPAZINE) 10 MG tablet Take 1 tablet (10 mg total) by mouth every 6 (six) hours as needed (Nausea or vomiting). 60 tablet 2   No current facility-administered medications for this visit.    OBJECTIVE: Vitals:   08/25/19 1051  BP: 108/81  Pulse: 76  Resp: 17  Temp: (!) 96.9 F (36.1 C)  SpO2: 100%     Body mass index is 20.62 kg/m.    ECOG FS:0 - Asymptomatic  General: Thin, no acute distress. Eyes: Pink conjunctiva, anicteric sclera. HEENT: Normocephalic, moist mucous membranes. Lungs: No audible wheezing or coughing. Heart: Regular rate and rhythm. Abdomen: Soft, nontender, no obvious distention.  Musculoskeletal: No edema, cyanosis, or clubbing. Neuro: Alert, answering all questions appropriately. Cranial nerves grossly intact. Skin: No rashes or petechiae noted. Psych: Normal affect.  LAB RESULTS:  Lab Results  Component Value Date   NA 141 03/15/2019   K 4.6 03/15/2019   CL 102 03/15/2019   CO2 27 03/15/2019   GLUCOSE 108 (H) 03/15/2019   BUN 23 03/15/2019   CREATININE 1.24 03/15/2019   CALCIUM 9.1 03/15/2019   PROT 8.0 10/18/2018   ALBUMIN 4.7 10/18/2018   AST 31 03/15/2019   ALT 28 03/15/2019   ALKPHOS 62 10/18/2018   BILITOT 1.0 10/18/2018   GFRNONAA 58 (L) 03/15/2019   GFRAA 67 03/15/2019    Lab Results  Component Value Date   WBC 3.7 03/15/2019   NEUTROABS 2.7 03/15/2019   HGB 13.0 03/15/2019   HCT 39.2 03/15/2019   MCV 98 (H) 03/15/2019   PLT 130 (L) 03/15/2019     STUDIES: No results found.  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  PLAN:    1.  Recurrent adenocarcinoma of the lower third of the esophagus: Patient initially completed 6 cycles of weekly carboplatinum and Taxol on August 21, 2016 and daily XRT completed on August 27, 2016. Patient had his esophagectomy on October 07, 2016.  His most recent imaging at [facility] suggested recurrence which was confirmed with biopsy.  Per recommendation of [facility], will proceed with palliative FOLFOX.  Patient will require port placement prior to initiating treatment.  Awaiting final pathology results to assess whether nivolumab will be of benefit.  Return to clinic in 1 week for further evaluation and consideration of cycle 1 of FOLFOX. 2.  Weight loss: Have given referral to dietary as well as palliative care. 3.  Thrombocytopenia: Chronic and unchanged.   4. Renal insufficiency: Mild.  Patient's most recent creatinine is at his baseline of 1.24.    I spent a total of 25 minutes face-to-face with the patient and reviewing chart data of which greater than  50% of the visit was spent in counseling and coordination of care as detailed above.  Patient expressed understanding and was in agreement with this plan. He also understands that He can call clinic at any time with any questions, concerns, or complaints.    [name], MD 08/26/19 10:10 AM

## 2019-08-24 ENCOUNTER — Other Ambulatory Visit: Payer: Self-pay

## 2019-08-24 NOTE — Progress Notes (Signed)
Patient unable to hear on the phone, wife supplied information for chart.

## 2019-08-25 ENCOUNTER — Other Ambulatory Visit: Payer: Self-pay

## 2019-08-25 ENCOUNTER — Inpatient Hospital Stay: Payer: Medicare Other | Attending: Oncology | Admitting: Oncology

## 2019-08-25 VITALS — BP 108/81 | HR 76 | Temp 96.9°F | Resp 17 | Wt 139.6 lb

## 2019-08-25 DIAGNOSIS — D649 Anemia, unspecified: Secondary | ICD-10-CM | POA: Insufficient documentation

## 2019-08-25 DIAGNOSIS — Z5111 Encounter for antineoplastic chemotherapy: Secondary | ICD-10-CM | POA: Diagnosis not present

## 2019-08-25 DIAGNOSIS — R531 Weakness: Secondary | ICD-10-CM | POA: Diagnosis not present

## 2019-08-25 DIAGNOSIS — Z8249 Family history of ischemic heart disease and other diseases of the circulatory system: Secondary | ICD-10-CM | POA: Diagnosis not present

## 2019-08-25 DIAGNOSIS — Z7189 Other specified counseling: Secondary | ICD-10-CM

## 2019-08-25 DIAGNOSIS — Z79899 Other long term (current) drug therapy: Secondary | ICD-10-CM | POA: Diagnosis not present

## 2019-08-25 DIAGNOSIS — I2510 Atherosclerotic heart disease of native coronary artery without angina pectoris: Secondary | ICD-10-CM | POA: Diagnosis not present

## 2019-08-25 DIAGNOSIS — R197 Diarrhea, unspecified: Secondary | ICD-10-CM | POA: Diagnosis not present

## 2019-08-25 DIAGNOSIS — C155 Malignant neoplasm of lower third of esophagus: Secondary | ICD-10-CM | POA: Insufficient documentation

## 2019-08-25 DIAGNOSIS — R634 Abnormal weight loss: Secondary | ICD-10-CM | POA: Insufficient documentation

## 2019-08-25 DIAGNOSIS — Z841 Family history of disorders of kidney and ureter: Secondary | ICD-10-CM | POA: Insufficient documentation

## 2019-08-25 DIAGNOSIS — G629 Polyneuropathy, unspecified: Secondary | ICD-10-CM | POA: Insufficient documentation

## 2019-08-25 DIAGNOSIS — I252 Old myocardial infarction: Secondary | ICD-10-CM | POA: Insufficient documentation

## 2019-08-25 DIAGNOSIS — Z9221 Personal history of antineoplastic chemotherapy: Secondary | ICD-10-CM | POA: Insufficient documentation

## 2019-08-25 DIAGNOSIS — R040 Epistaxis: Secondary | ICD-10-CM | POA: Insufficient documentation

## 2019-08-25 DIAGNOSIS — Z923 Personal history of irradiation: Secondary | ICD-10-CM | POA: Insufficient documentation

## 2019-08-25 DIAGNOSIS — I2583 Coronary atherosclerosis due to lipid rich plaque: Secondary | ICD-10-CM | POA: Diagnosis not present

## 2019-08-25 DIAGNOSIS — D696 Thrombocytopenia, unspecified: Secondary | ICD-10-CM | POA: Diagnosis not present

## 2019-08-25 DIAGNOSIS — Z833 Family history of diabetes mellitus: Secondary | ICD-10-CM | POA: Diagnosis not present

## 2019-08-25 DIAGNOSIS — Z818 Family history of other mental and behavioral disorders: Secondary | ICD-10-CM | POA: Diagnosis not present

## 2019-08-25 DIAGNOSIS — Z83438 Family history of other disorder of lipoprotein metabolism and other lipidemia: Secondary | ICD-10-CM | POA: Diagnosis not present

## 2019-08-25 DIAGNOSIS — R5383 Other fatigue: Secondary | ICD-10-CM | POA: Diagnosis not present

## 2019-08-25 DIAGNOSIS — N289 Disorder of kidney and ureter, unspecified: Secondary | ICD-10-CM | POA: Insufficient documentation

## 2019-08-25 DIAGNOSIS — I4891 Unspecified atrial fibrillation: Secondary | ICD-10-CM | POA: Diagnosis not present

## 2019-08-25 DIAGNOSIS — I10 Essential (primary) hypertension: Secondary | ICD-10-CM | POA: Insufficient documentation

## 2019-08-26 ENCOUNTER — Other Ambulatory Visit
Admission: RE | Admit: 2019-08-26 | Discharge: 2019-08-26 | Disposition: A | Discharge status: Home / Self Care | Payer: Medicare Other | Source: Ambulatory Visit | Attending: Surgery | Admitting: Surgery

## 2019-08-26 ENCOUNTER — Other Ambulatory Visit: Payer: Self-pay | Admitting: Surgery

## 2019-08-26 DIAGNOSIS — Z20828 Contact with and (suspected) exposure to other viral communicable diseases: Secondary | ICD-10-CM | POA: Insufficient documentation

## 2019-08-26 DIAGNOSIS — C155 Malignant neoplasm of lower third of esophagus: Secondary | ICD-10-CM

## 2019-08-26 DIAGNOSIS — Z01812 Encounter for preprocedural laboratory examination: Secondary | ICD-10-CM | POA: Insufficient documentation

## 2019-08-26 DIAGNOSIS — Z7189 Other specified counseling: Secondary | ICD-10-CM | POA: Insufficient documentation

## 2019-08-26 MED ORDER — PROCHLORPERAZINE MALEATE 10 MG PO TABS: 10.0000 mg | ORAL_TABLET | Freq: Four times a day (QID) | ORAL | 2 refills | Status: DC | PRN

## 2019-08-26 MED ORDER — ONDANSETRON HCL 8 MG PO TABS: 8.0000 mg | ORAL_TABLET | Freq: Two times a day (BID) | ORAL | 2 refills | Status: DC | PRN

## 2019-08-26 MED ORDER — LIDOCAINE-PRILOCAINE 2.5-2.5 % EX CREA: TOPICAL_CREAM | CUTANEOUS | 3 refills | Status: DC

## 2019-08-26 NOTE — Progress Notes (Signed)
DISCONTINUE ON PATHWAY REGIMEN - Gastroesophageal     Administer weekly during RT:     Paclitaxel        Dose Mod: None     Carboplatin        Dose Mod: None  **Always confirm dose/schedule in your pharmacy ordering system**  REASON: Disease Progression PRIOR TREATMENT: [id number]: Carboplatin + Paclitaxel (2/50) Weekly (x 5-6 Weeks) with Concurrent RT TREATMENT RESPONSE: Complete Response (CR)  START OFF PATHWAY REGIMEN - Gastroesophageal   [id number]:FOLFOX (q14d) **2 cycles per order sheet**:   A cycle is every 14 days:     Oxaliplatin      Leucovorin      Fluorouracil      Fluorouracil   **Always confirm dose/schedule in your pharmacy ordering system**  Patient Characteristics: Distant Metastases (cM1/pM1) / Locally Recurrent Disease, Adenocarcinoma - Esophageal, GE Junction, and Gastric, Second Line, MSS / pMMR or MSI Unknown Histology: Adenocarcinoma Disease Classification: GE Junction Therapeutic Status: Local Recurrence (No Additional Staging) Line of Therapy: Second Line Microsatellite/Mismatch Repair Status: Unknown Intent of Therapy: Non-Curative / Palliative Intent, Discussed with Patient

## 2019-08-27 ENCOUNTER — Other Ambulatory Visit: Payer: Self-pay | Admitting: Oncology

## 2019-08-27 ENCOUNTER — Ambulatory Visit: Payer: Medicare Other | Admitting: Surgery

## 2019-08-27 LAB — SARS CORONAVIRUS 2 (TAT 6-24 HRS): SARS Coronavirus 2: NEGATIVE

## 2019-08-29 NOTE — Progress Notes (Signed)
[place]  [phone] [phone]  ID: [name] OB: 1945/10/16  MR#: [id number]  LGX#:[id number]  Patient Care Team: [name], MD as PCP - General (Family Medicine) [name], MD as PCP - Family Medicine (Family Medicine) [name], MD as Consulting Physician (Oncology) [name], MD as Referring Physician [name], MD as Consulting Physician (Cardiology)  CHIEF COMPLAINT: Recurrent adenocarcinoma of the lower third of the esophagus.  INTERVAL HISTORY: Patient returns to clinic today for further evaluation and initiation of cycle 1 of palliative FOLFOX.  He has chronic weakness and fatigue, but otherwise feels well. He has lost a significant amount of weight in the interim.  He denies dysphagia, but continues to have difficulty swallowing.  He has no neurologic complaints.  He denies any recent fevers or illnesses.  He denies any chest pain, shortness of breath, cough, or hemoptysis.  He denies any nausea, vomiting, constipation, or diarrhea.  He has no melena or hematochezia.  He has no urinary complaints.  Patient offers no further specific complaints today.  REVIEW OF SYSTEMS:   Review of Systems  Constitutional: Positive for malaise/fatigue and weight loss. Negative for fever.  HENT: Negative.   Respiratory: Negative.  Negative for cough and shortness of breath.   Cardiovascular: Negative.  Negative for chest pain and leg swelling.  Gastrointestinal: Positive for diarrhea. Negative for abdominal pain, blood in stool, constipation, melena, nausea and vomiting.  Genitourinary: Negative.  Negative for frequency and urgency.  Musculoskeletal: Negative.  Negative for back pain.  Skin: Negative.  Negative for rash.  Neurological: Positive for weakness. Negative for tingling, focal weakness and headaches.  Psychiatric/Behavioral: Negative.  The patient is not nervous/anxious and does not have insomnia.      As per HPI. Otherwise, a complete review of systems is negative.  PAST MEDICAL HISTORY: Past Medical History:  Diagnosis Date  . Abnormal white blood cell (WBC) count    seeing Dr. [name] at [place] on 10/25  . Atrial fibrillation ([place])   . BPH (benign prostatic hypertrophy)   . Bradycardia   . CAD (coronary artery disease)   . Dysrhythmia    bradycardia - sees Dr. Humphrey Rolls  . Esophageal cancer ([place]) 05/2016  . GERD (gastroesophageal reflux disease)   . History of kidney stones   . Hyperlipidemia   . Hypertension   . Hypothyroidism   . Kidney stone   . Myocardial infarction ([place]) 2005  . S/P angioplasty with stent 2005   x 4 vessels  . Sleep apnea    mild-NO CPAP  . Spinal stenosis    lumbar    PAST SURGICAL HISTORY: Past Surgical History:  Procedure Laterality Date  . BACK SURGERY    . CARDIAC CATHETERIZATION Left 03/12/2016   Procedure: Left Heart Cath and Coronary Angiography;  Surgeon: [name], MD;  Location: [place] [facility];  Service: Cardiovascular;  Laterality: Left;  . CARDIAC CATHETERIZATION N/A 03/12/2016   Procedure: Coronary Stent Intervention;  Surgeon: Yolonda Kida, MD;  Location: [place] [facility];  Service: Cardiovascular;  Laterality: N/A;  . COLONOSCOPY WITH PROPOFOL N/A 07/13/2015   Procedure: COLONOSCOPY WITH PROPOFOL;  Surgeon: Lucilla Lame, MD;  Location: [place];  Service: Endoscopy;  Laterality: N/A;  . CORONARY ANGIOPLASTY  10/05 and 12/05   4 DES placed  . CYSTOSCOPY W/ RETROGRADES Left 06/15/2019   Procedure: CYSTOSCOPY WITH RETROGRADE PYELOGRAM;  Surgeon: Abbie Sons, MD;  Location: [facility];  Service: Urology;  Laterality: Left;  . CYSTOSCOPY/URETEROSCOPY/HOLMIUM LASER/STENT PLACEMENT Left 06/15/2019   Procedure: CYSTOSCOPY/URETEROSCOPY/HOLMIUM LASER/STENT PLACEMENT;  Surgeon: Abbie Sons, MD;  Location: [facility];  Service: Urology;  Laterality: Left;  . ESOPHAGECTOMY  10/07/2016   @ [facility]  .  ESOPHAGOGASTRODUODENOSCOPY (EGD) WITH PROPOFOL N/A 06/14/2016   Procedure: ESOPHAGOGASTRODUODENOSCOPY (EGD) WITH PROPOFOL;  Surgeon: [name], MD;  Location: [facility];  Service: Endoscopy;  Laterality: N/A;  . HERNIA REPAIR Bilateral    x3  . INGUINAL HERNIA REPAIR Right 08/28/2017   Large PerFix plug, recurrent hernia;  Surgeon: Robert Bellow, MD;  Location: [facility] ORS;  Service: General;  Laterality: Right;  recurrent  . KIDNEY STONE SURGERY  07/15/2019  . POLYPECTOMY  07/13/2015   Procedure: POLYPECTOMY;  Surgeon: Lucilla Lame, MD;  Location: [place];  Service: Endoscopy;;  . PORT-A-CATH REMOVAL N/A 01/16/2017   Procedure: REMOVAL PORT-A-CATH;  Surgeon: Olean Ree, MD;  Location: [facility];  Service: General;  Laterality: N/A;  . PORTACATH PLACEMENT N/A 07/08/2016   Procedure: INSERTION PORT-A-CATH;  Surgeon: Olean Ree, MD;  Location: [facility];  Service: General;  Laterality: N/A;  . PORTACATH PLACEMENT Right 08/30/2019   Procedure: INSERTION PORT-A-CATH;  Surgeon: Olean Ree, MD;  Location: [facility];  Service: General;  Laterality: Right;    FAMILY HISTORY Family History  Problem Relation Age of Onset  . Hypertension Mother   . Dementia Mother   . Heart disease Father   . Hypertension Father   . Kidney disease Father   . Hypertension Brother   . Heart disease Brother   . Diabetes Son   . Hypertension Son   . Hyperlipidemia Son   . Hyperlipidemia Daughter   . Hypertension Daughter        ADVANCED DIRECTIVES:    HEALTH MAINTENANCE: Social History   Tobacco Use  . Smoking status: Former Smoker    Packs/day: 1.00    Years: 40.00    Pack years: 40.00    Types: Cigarettes    Quit date: 07/16/2004    Years since quitting: 15.1  . Smokeless tobacco: Former Systems developer    Types: Chew  Substance Use Topics  . Alcohol use: Yes    Alcohol/week: 1.0 standard drinks    Types: 1 Cans of beer per week  . Drug use: No     Colonoscopy:  PAP:   Bone density:  Lipid panel:  No Known Allergies  Current Outpatient Medications  Medication Sig Dispense Refill  . acetaminophen (TYLENOL) 500 MG tablet Take 500 mg by mouth every 6 (six) hours as needed for mild pain or moderate pain.    Marland Kitchen aspirin 81 MG tablet Take 81 mg by mouth daily.     Marland Kitchen atorvastatin (LIPITOR) 40 MG tablet Take 1 tablet (40 mg total) by mouth daily. 90 tablet 4  . clopidogrel (PLAVIX) 75 MG tablet Take 1 tablet (75 mg total) by mouth daily. 90 tablet 2  . HYDROcodone-acetaminophen (NORCO/VICODIN) 5-325 MG tablet Take 1 tablet by mouth every 6 (six) hours as needed for moderate pain. 15 tablet 0  . isosorbide mononitrate (IMDUR) 30 MG 24 hr tablet Take 30 mg by mouth every evening.   5  . levothyroxine (SYNTHROID, LEVOTHROID) 50 MCG tablet Take 1 tablet (50 mcg total) by mouth daily. 90 tablet 4  . lidocaine-prilocaine (EMLA) cream Apply to affected area once (Patient taking differently: Apply 1 application topically once. Apply to affected area once) 30 g 3  .  lisinopril (PRINIVIL,ZESTRIL) 10 MG tablet Take 1 tablet (10 mg total) by mouth every morning. (Patient taking differently: Take 10 mg by mouth daily. ) 90 tablet 4  . loperamide (IMODIUM A-D) 2 MG tablet Take 2 mg by mouth 4 (four) times daily as needed for diarrhea or loose stools.    . metoprolol succinate (TOPROL-XL) 25 MG 24 hr tablet Take 25 mg by mouth daily.    . multivitamin-iron-minerals-folic acid (CENTRUM) chewable tablet Chew 1 tablet by mouth daily.    . nitroGLYCERIN (NITROSTAT) 0.4 MG SL tablet Place 1 tablet under the tongue every 5 (five) minutes as needed for chest pain.   98  . ondansetron (ZOFRAN) 8 MG tablet Take 1 tablet (8 mg total) by mouth 2 (two) times daily as needed for refractory nausea / vomiting. Start on day 3 after chemotherapy. 60 tablet 2  . pantoprazole (PROTONIX) 20 MG tablet Take 1 tablet (20 mg total) by mouth daily. 90 tablet 4  . prochlorperazine (COMPAZINE) 10 MG tablet  Take 1 tablet (10 mg total) by mouth every 6 (six) hours as needed (Nausea or vomiting). 60 tablet 2   No current facility-administered medications for this visit.   Facility-Administered Medications Ordered in Other Visits  Medication Dose Route Frequency Provider Last Rate Last Admin  . fluorouracil (ADRUCIL) 4,200 mg in sodium chloride 0.9 % 66 mL chemo infusion  2,400 mg/m2 (Treatment Plan Recorded) Intravenous 1 day or 1 dose [name], MD      . fluorouracil (ADRUCIL) chemo injection 700 mg  400 mg/m2 (Treatment Plan Recorded) Intravenous Once [name], MD      . heparin lock flush 100 unit/mL  500 Units Intravenous Once [name], MD      . leucovorin 700 mg in dextrose 5 % 250 mL infusion  700 mg Intravenous Once [name], MD   Stopped at 09/01/19 1231  . oxaliplatin (ELOXATIN) 150 mg in dextrose 5 % 500 mL chemo infusion  85 mg/m2 (Treatment Plan Recorded) Intravenous Once [name], MD   Stopped at 09/01/19 1233    OBJECTIVE: Vitals:   09/01/19 0836  BP: 111/68  Pulse: 73  Resp: 18  Temp: (!) 97.3 F (36.3 C)  SpO2: 97%     Body mass index is 20.85 kg/m.    ECOG FS:0 - Asymptomatic  General: Thin, no acute distress. Eyes: Pink conjunctiva, anicteric sclera. HEENT: Normocephalic, moist mucous membranes. Lungs: No audible wheezing or coughing. Heart: Regular rate and rhythm. Abdomen: Soft, nontender, no obvious distention. Musculoskeletal: No edema, cyanosis, or clubbing. Neuro: Alert, answering all questions appropriately. Cranial nerves grossly intact. Skin: No rashes or petechiae noted. Psych: Normal affect.   LAB RESULTS:  Lab Results  Component Value Date   NA 138 09/01/2019   K 4.1 09/01/2019   CL 102 09/01/2019   CO2 28 09/01/2019   GLUCOSE 112 (H) 09/01/2019   BUN 24 (H) 09/01/2019   CREATININE 1.14 09/01/2019   CALCIUM 8.5 (L) 09/01/2019   PROT 7.0 09/01/2019   ALBUMIN 3.5 09/01/2019   AST 20  09/01/2019   ALT 19 09/01/2019   ALKPHOS 61 09/01/2019   BILITOT 0.6 09/01/2019   GFRNONAA >60 09/01/2019   GFRAA >60 09/01/2019    Lab Results  Component Value Date   WBC 7.5 09/01/2019   NEUTROABS 6.1 09/01/2019   HGB 10.7 (L) 09/01/2019   HCT 33.5 (L) 09/01/2019   MCV 102.1 (H) 09/01/2019   PLT 182 09/01/2019  STUDIES: DG Chest Port 1 View  Result Date: 08/30/2019 CLINICAL DATA:  Port-A-Cath insertion EXAM: PORTABLE CHEST 1 VIEW COMPARISON:  Radiograph 12/21/2016, CT 06/26/2016 FINDINGS: A right subclavian approach Port-A-Cath tip terminates in the upper SVC. No pneumothorax or effusion. Patchy right perihilar opacity is noted. Some bandlike areas of scarring are noted in the right lung base. The cardiomediastinal contours are unremarkable. Degenerative changes are present in the imaged spine and shoulders. IMPRESSION: 1. Right subclavian approach Port-A-Cath tip terminates in the upper SVC. No pneumothorax or effusion. 2. Patchy right perihilar opacity may represent atelectasis or infiltrate. 3. Scarring in the right lung base. Electronically Signed   By: Lovena Le M.D.   On: 08/30/2019 16:53   DG C-Arm 1-60 Min-No Report  Result Date: 08/30/2019 Fluoroscopy was utilized by the requesting physician.  No radiographic interpretation.    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  PLAN:    1.  Recurrent adenocarcinoma of the lower third of the esophagus: Patient initially completed 6 cycles of weekly carboplatinum and Taxol on August 21, 2016 and daily XRT completed on August 27, 2016. Patient had his esophagectomy on October 07, 2016.  His most recent imaging at [facility] suggested recurrence which was confirmed with biopsy.  Per recommendation of [facility], will proceed with palliative FOLFOX.  Patient now has had port placement. Awaiting final pathology results to assess whether nivolumab will be of benefit.  Proceed with cycle  1 of FOLFOX today.  Return to clinic in 2 days for pump removal and in 1 week for laboratory work and further evaluation.  Patient will then return to clinic in 3 weeks because of the Christmas holiday for further evaluation and consideration of cycle 2. 2.  Weight loss: Have given referral to dietary as well as palliative care. 3.  Thrombocytopenia: Resolved. 4. Renal insufficiency: Resolved. 5.  Diarrhea: Continue OTC Imodium.   Patient expressed understanding and was in agreement with this plan. He also understands that He can call clinic at any time with any questions, concerns, or complaints.    [name], MD 09/01/19 11:09 AM

## 2019-08-30 ENCOUNTER — Ambulatory Visit: Payer: Medicare Other

## 2019-08-30 ENCOUNTER — Encounter: Payer: Self-pay | Admitting: Surgery

## 2019-08-30 ENCOUNTER — Encounter
Admission: RE | Disposition: A | Discharge status: Home / Self Care | Payer: Self-pay | Source: Home / Self Care | Attending: Surgery

## 2019-08-30 ENCOUNTER — Other Ambulatory Visit: Payer: Self-pay

## 2019-08-30 ENCOUNTER — Ambulatory Visit: Payer: Medicare Other | Admitting: Certified Registered Nurse Anesthetist

## 2019-08-30 ENCOUNTER — Ambulatory Visit
Admission: RE | Admit: 2019-08-30 | Discharge: 2019-08-30 | Disposition: A | Discharge status: Home / Self Care | Payer: Medicare Other | Attending: Surgery | Admitting: Surgery

## 2019-08-30 DIAGNOSIS — I2510 Atherosclerotic heart disease of native coronary artery without angina pectoris: Secondary | ICD-10-CM | POA: Insufficient documentation

## 2019-08-30 DIAGNOSIS — R918 Other nonspecific abnormal finding of lung field: Secondary | ICD-10-CM | POA: Insufficient documentation

## 2019-08-30 DIAGNOSIS — C155 Malignant neoplasm of lower third of esophagus: Secondary | ICD-10-CM

## 2019-08-30 DIAGNOSIS — J984 Other disorders of lung: Secondary | ICD-10-CM | POA: Diagnosis not present

## 2019-08-30 DIAGNOSIS — I252 Old myocardial infarction: Secondary | ICD-10-CM | POA: Insufficient documentation

## 2019-08-30 DIAGNOSIS — I4891 Unspecified atrial fibrillation: Secondary | ICD-10-CM | POA: Insufficient documentation

## 2019-08-30 DIAGNOSIS — Z79890 Hormone replacement therapy (postmenopausal): Secondary | ICD-10-CM | POA: Insufficient documentation

## 2019-08-30 DIAGNOSIS — G4730 Sleep apnea, unspecified: Secondary | ICD-10-CM | POA: Diagnosis not present

## 2019-08-30 DIAGNOSIS — Z7982 Long term (current) use of aspirin: Secondary | ICD-10-CM | POA: Insufficient documentation

## 2019-08-30 DIAGNOSIS — Z923 Personal history of irradiation: Secondary | ICD-10-CM | POA: Diagnosis not present

## 2019-08-30 DIAGNOSIS — Z955 Presence of coronary angioplasty implant and graft: Secondary | ICD-10-CM | POA: Diagnosis not present

## 2019-08-30 DIAGNOSIS — I10 Essential (primary) hypertension: Secondary | ICD-10-CM | POA: Diagnosis not present

## 2019-08-30 DIAGNOSIS — Z9049 Acquired absence of other specified parts of digestive tract: Secondary | ICD-10-CM | POA: Insufficient documentation

## 2019-08-30 DIAGNOSIS — K219 Gastro-esophageal reflux disease without esophagitis: Secondary | ICD-10-CM | POA: Insufficient documentation

## 2019-08-30 DIAGNOSIS — Z87891 Personal history of nicotine dependence: Secondary | ICD-10-CM | POA: Diagnosis not present

## 2019-08-30 DIAGNOSIS — Z8501 Personal history of malignant neoplasm of esophagus: Secondary | ICD-10-CM | POA: Insufficient documentation

## 2019-08-30 DIAGNOSIS — C159 Malignant neoplasm of esophagus, unspecified: Secondary | ICD-10-CM | POA: Insufficient documentation

## 2019-08-30 DIAGNOSIS — E039 Hypothyroidism, unspecified: Secondary | ICD-10-CM | POA: Insufficient documentation

## 2019-08-30 DIAGNOSIS — Z79899 Other long term (current) drug therapy: Secondary | ICD-10-CM | POA: Insufficient documentation

## 2019-08-30 DIAGNOSIS — Z8249 Family history of ischemic heart disease and other diseases of the circulatory system: Secondary | ICD-10-CM | POA: Diagnosis not present

## 2019-08-30 DIAGNOSIS — Z7902 Long term (current) use of antithrombotics/antiplatelets: Secondary | ICD-10-CM | POA: Diagnosis not present

## 2019-08-30 DIAGNOSIS — Z9221 Personal history of antineoplastic chemotherapy: Secondary | ICD-10-CM | POA: Insufficient documentation

## 2019-08-30 DIAGNOSIS — Z95828 Presence of other vascular implants and grafts: Secondary | ICD-10-CM

## 2019-08-30 HISTORY — PX: PORTACATH PLACEMENT: SHX2246

## 2019-08-30 SURGERY — INSERTION, TUNNELED CENTRAL VENOUS DEVICE, WITH PORT
Anesthesia: General | Laterality: Right

## 2019-08-30 MED ORDER — GLYCOPYRROLATE 0.2 MG/ML IJ SOLN: INTRAMUSCULAR | Status: DC | PRN

## 2019-08-30 MED ORDER — CHLORHEXIDINE GLUCONATE CLOTH 2 % EX PADS
6.0000 | MEDICATED_PAD | Freq: Once | CUTANEOUS | Status: AC
  Administered 2019-08-30: 6 via TOPICAL

## 2019-08-30 MED ORDER — BUPIVACAINE HCL (PF) 0.5 % IJ SOLN
INTRAMUSCULAR | Status: DC | PRN
  Administered 2019-08-30: 10 mL

## 2019-08-30 MED ORDER — HYDROCODONE-ACETAMINOPHEN 5-325 MG PO TABS: 1.0000 | ORAL_TABLET | Freq: Four times a day (QID) | ORAL | 0 refills | Status: DC | PRN

## 2019-08-30 MED ORDER — FENTANYL CITRATE (PF) 100 MCG/2ML IJ SOLN
INTRAMUSCULAR | Status: DC | PRN
  Administered 2019-08-30 (×2): 50 ug via INTRAVENOUS

## 2019-08-30 MED ORDER — LIDOCAINE HCL (PF) 1 % IJ SOLN
INTRAMUSCULAR | Status: DC | PRN
  Administered 2019-08-30: 10 mL

## 2019-08-30 MED ORDER — EPHEDRINE SULFATE 50 MG/ML IJ SOLN
INTRAMUSCULAR | Status: AC
  Filled 2019-08-30: qty 1

## 2019-08-30 MED ORDER — FENTANYL CITRATE (PF) 100 MCG/2ML IJ SOLN
INTRAMUSCULAR | Status: AC
  Filled 2019-08-30: qty 2

## 2019-08-30 MED ORDER — ACETAMINOPHEN 500 MG PO TABS
ORAL_TABLET | ORAL | Status: AC
  Administered 2019-08-30: 1000 mg via ORAL
  Filled 2019-08-30: qty 2

## 2019-08-30 MED ORDER — PROPOFOL 500 MG/50ML IV EMUL
INTRAVENOUS | Status: DC | PRN
  Administered 2019-08-30: 75 ug/kg/min via INTRAVENOUS

## 2019-08-30 MED ORDER — FENTANYL CITRATE (PF) 100 MCG/2ML IJ SOLN: 25.0000 ug | INTRAMUSCULAR | Status: DC | PRN

## 2019-08-30 MED ORDER — LACTATED RINGERS IV SOLN
INTRAVENOUS | Status: DC
  Administered 2019-08-30: 13:00:00 via INTRAVENOUS

## 2019-08-30 MED ORDER — HEPARIN SODIUM (PORCINE) 5000 UNIT/ML IJ SOLN
INTRAMUSCULAR | Status: AC
  Filled 2019-08-30: qty 1

## 2019-08-30 MED ORDER — ONDANSETRON HCL 4 MG/2ML IJ SOLN
INTRAMUSCULAR | Status: DC | PRN
  Administered 2019-08-30: 4 mg via INTRAVENOUS

## 2019-08-30 MED ORDER — PROPOFOL 10 MG/ML IV BOLUS
INTRAVENOUS | Status: DC | PRN
  Administered 2019-08-30: 100 mg via INTRAVENOUS

## 2019-08-30 MED ORDER — DEXAMETHASONE SODIUM PHOSPHATE 10 MG/ML IJ SOLN
INTRAMUSCULAR | Status: DC | PRN
  Administered 2019-08-30: 5 mg via INTRAVENOUS

## 2019-08-30 MED ORDER — LIDOCAINE HCL (PF) 1 % IJ SOLN
INTRAMUSCULAR | Status: AC
  Filled 2019-08-30: qty 30

## 2019-08-30 MED ORDER — BUPIVACAINE HCL (PF) 0.5 % IJ SOLN
INTRAMUSCULAR | Status: AC
  Filled 2019-08-30: qty 30

## 2019-08-30 MED ORDER — PHENYLEPHRINE HCL (PRESSORS) 10 MG/ML IV SOLN
INTRAVENOUS | Status: DC | PRN
  Administered 2019-08-30: 50 ug via INTRAVENOUS
  Administered 2019-08-30 (×6): 100 ug via INTRAVENOUS

## 2019-08-30 MED ORDER — PROPOFOL 500 MG/50ML IV EMUL
INTRAVENOUS | Status: AC
  Filled 2019-08-30: qty 50

## 2019-08-30 MED ORDER — GABAPENTIN 300 MG PO CAPS
ORAL_CAPSULE | ORAL | Status: AC
  Administered 2019-08-30: 300 mg via ORAL
  Filled 2019-08-30: qty 1

## 2019-08-30 MED ORDER — EPHEDRINE SULFATE 50 MG/ML IJ SOLN
INTRAMUSCULAR | Status: DC | PRN
  Administered 2019-08-30: 5 mg via INTRAVENOUS
  Administered 2019-08-30: 10 mg via INTRAVENOUS

## 2019-08-30 MED ORDER — ONDANSETRON HCL 4 MG/2ML IJ SOLN: 4.0000 mg | Freq: Once | INTRAMUSCULAR | Status: DC | PRN

## 2019-08-30 MED ORDER — ACETAMINOPHEN 500 MG PO TABS: 1000.0000 mg | ORAL_TABLET | ORAL | Status: AC

## 2019-08-30 MED ORDER — CEFAZOLIN SODIUM-DEXTROSE 2-4 GM/100ML-% IV SOLN
INTRAVENOUS | Status: AC
  Filled 2019-08-30: qty 100

## 2019-08-30 MED ORDER — GLYCOPYRROLATE 0.2 MG/ML IJ SOLN
INTRAMUSCULAR | Status: DC | PRN
  Administered 2019-08-30: .15 mg via INTRAVENOUS

## 2019-08-30 MED ORDER — MIDAZOLAM HCL 2 MG/2ML IJ SOLN
INTRAMUSCULAR | Status: DC | PRN
  Administered 2019-08-30 (×2): 1 mg via INTRAVENOUS

## 2019-08-30 MED ORDER — ONDANSETRON HCL 4 MG/2ML IJ SOLN
INTRAMUSCULAR | Status: AC
  Filled 2019-08-30: qty 2

## 2019-08-30 MED ORDER — CEFAZOLIN SODIUM-DEXTROSE 2-4 GM/100ML-% IV SOLN
2.0000 g | INTRAVENOUS | Status: AC
  Administered 2019-08-30: 2 g via INTRAVENOUS

## 2019-08-30 MED ORDER — SODIUM CHLORIDE 0.9 % IV SOLN
INTRAVENOUS | Status: DC | PRN
  Administered 2019-08-30: 15:00:00 5 mL via INTRAMUSCULAR

## 2019-08-30 MED ORDER — SODIUM CHLORIDE (PF) 0.9 % IJ SOLN
INTRAMUSCULAR | Status: AC
  Filled 2019-08-30: qty 50

## 2019-08-30 MED ORDER — DEXAMETHASONE SODIUM PHOSPHATE 10 MG/ML IJ SOLN
INTRAMUSCULAR | Status: AC
  Filled 2019-08-30: qty 1

## 2019-08-30 MED ORDER — MIDAZOLAM HCL 2 MG/2ML IJ SOLN
INTRAMUSCULAR | Status: AC
  Filled 2019-08-30: qty 2

## 2019-08-30 MED ORDER — GABAPENTIN 300 MG PO CAPS: 300.0000 mg | ORAL_CAPSULE | ORAL | Status: AC

## 2019-08-30 SURGICAL SUPPLY — 34 items
ADH SKN CLS APL DERMABOND .7 (GAUZE/BANDAGES/DRESSINGS) ×1
APL PRP STRL LF DISP 70% ISPRP (MISCELLANEOUS) ×1
BLADE SURG SZ11 CARB STEEL (BLADE) ×3 IMPLANT
CANISTER SUCT 1200ML W/VALVE (MISCELLANEOUS) ×3 IMPLANT
CHLORAPREP W/TINT 26 (MISCELLANEOUS) ×3 IMPLANT
COVER LIGHT HANDLE STERIS (MISCELLANEOUS) ×6 IMPLANT
COVER WAND RF STERILE (DRAPES) ×3 IMPLANT
DECANTER SPIKE VIAL GLASS SM (MISCELLANEOUS) ×9 IMPLANT
DERMABOND ADVANCED (GAUZE/BANDAGES/DRESSINGS) ×2
DERMABOND ADVANCED .7 DNX12 (GAUZE/BANDAGES/DRESSINGS) ×1 IMPLANT
DRAPE C-ARM XRAY 36X54 (DRAPES) ×3 IMPLANT
ELECT REM PT RETURN 9FT ADLT (ELECTROSURGICAL) ×3
ELECTRODE REM PT RTRN 9FT ADLT (ELECTROSURGICAL) ×1 IMPLANT
GLOVE SURG SYN 7.0 (GLOVE) ×3 IMPLANT
GLOVE SURG SYN 7.5  E (GLOVE) ×2
GLOVE SURG SYN 7.5 E (GLOVE) ×1 IMPLANT
GLOVE SURG SYN 7.5 PF PI (GLOVE) ×1 IMPLANT
GOWN STRL REUS W/ TWL LRG LVL3 (GOWN DISPOSABLE) ×2 IMPLANT
GOWN STRL REUS W/TWL LRG LVL3 (GOWN DISPOSABLE) ×6
KIT PORT POWER 8FR ISP CVUE (Port) ×3 IMPLANT
KIT TURNOVER KIT A (KITS) ×3 IMPLANT
LABEL OR SOLS (LABEL) ×3 IMPLANT
NDL FILTER BLUNT 18X1 1/2 (NEEDLE) ×1 IMPLANT
NEEDLE FILTER BLUNT 18X 1/2SAF (NEEDLE) ×2
NEEDLE FILTER BLUNT 18X1 1/2 (NEEDLE) ×1 IMPLANT
NEEDLE HYPO 22GX1.5 SAFETY (NEEDLE) ×3 IMPLANT
NS IRRIG 500ML POUR BTL (IV SOLUTION) ×3 IMPLANT
PACK PORT-A-CATH (MISCELLANEOUS) ×3 IMPLANT
SUT MNCRL AB 4-0 PS2 18 (SUTURE) ×3 IMPLANT
SUT PROLENE 3 0 SH DA (SUTURE) ×3 IMPLANT
SUT VIC AB 3-0 SH 27 (SUTURE) ×3
SUT VIC AB 3-0 SH 27X BRD (SUTURE) ×1 IMPLANT
SYR 10ML LL (SYRINGE) ×3 IMPLANT
SYR 3ML LL SCALE MARK (SYRINGE) ×3 IMPLANT

## 2019-08-30 NOTE — Transfer of Care (Signed)
Immediate Anesthesia Transfer of Care Note  Patient: Corey Perry  Procedure(s) Performed: INSERTION PORT-A-CATH (Right )  Patient Location: PACU  Anesthesia Type:General  Level of Consciousness: sedated  Airway & Oxygen Therapy: Patient Spontanous Breathing and Patient connected to face mask oxygen  Post-op Assessment: Report given to RN and Post -op Vital signs reviewed and stable  Post vital signs: Reviewed and stable  Last Vitals:  Vitals Value Taken Time  BP 118/57 08/30/19 1611  Temp 35.9 C 08/30/19 1611  Pulse 70 08/30/19 1615  Resp 14 08/30/19 1615  SpO2 100 % 08/30/19 1615  Vitals shown include unvalidated device data.  Last Pain:  Vitals:   08/30/19 1611  TempSrc:   PainSc: (P) Asleep         Complications: No apparent anesthesia complications

## 2019-08-30 NOTE — Op Note (Signed)
  Procedure Date:  08/30/2019  Pre-operative Diagnosis:   Recurrent esophageal cancer  Post-operative Diagnosis:  Recurrent esophageal cancer  Procedure:  Right subclavian port-a-cath placement  Surgeon:  [name], MD  Anesthesia:  General endotracheal  Estimated Blood Loss:  10 ml  Specimens:  None  Complications:  None apparent  Indications for Procedure:  This is a 73 y.o. male who requires a port-a-cath for chemotherapy.  The risks of bleeding, infection, injury to surrounding structures, thrombosis, nonfunction, pneumothorax, hemothorax, and need for further procedures were discussed with the patient and was willing to proceed.  Description of Procedure: The patient was correctly identified in the preoperative area and brought into the operating room.  The patient was placed supine with VTE prophylaxis in place.  Appropriate time-outs were performed.  Anesthesia was induced and the patient was intubated.  Appropriate antibiotics were infused.  The right chest and neck were prepped and draped in usual sterile fashion. The patient was placed in Trendelenburg position and local anesthetic was infiltrated into the skin and subcutaneous tissues in the anterior chest wall. The large bore needle was placed into the subclavian vein without difficulty and then the Seldinger wire was advanced. Fluoroscopy was utilized to confirm that the Seldinger wire was in the superior vena cava.  An incision was made and a port pocket developed with blunt and electrocautery dissection. The introducer dilator was placed over the Seldinger wire the wire was removed. The previously flushed catheter was placed into the introducer dilator and the peel-away sheath was removed. The catheter length was confirmed and trimmed utilizing fluoroscopy for proper positioning. The catheter was then attached to the previously flushed port. The port was placed into the pocket. The port was secured in place with 3-0  Prolenes and flushed for function and heparin locked.  The wound was closed with interrupted 3-0 Vicryl followed by 4-0 subcuticular Monocryl sutures and sealed with DermaBond.  The patient was emerged from anesthesia and extubated and brought to the recovery room for further management.  A chest x-ray was ordered.  The patient tolerated the procedure well and all counts were correct at the end of the case.   [name], MD

## 2019-08-30 NOTE — Anesthesia Post-op Follow-up Note (Signed)
Anesthesia QCDR form completed.        

## 2019-08-30 NOTE — Discharge Instructions (Signed)

## 2019-08-30 NOTE — H&P (Signed)
Date of Admission:  08/30/2019  Reason for Admission:  Recurrent esophageal cancer  History of Present Illness: Corey Perry is a 73 y.o. male with a history of esophageal cancer, s/p chemo radiation in 2017, s/p esophagectomy at [facility].  He has recent recurrence confirmed on biopsy.  [facility] has recommended palliative chemo and needs new port-a-cath.  His prior portacath was on the left subclavian.  Denies any fevers, chills, chest pain, shortness of breath, rash, skin infections.  Past Medical History: Past Medical History:  Diagnosis Date  . Abnormal white blood cell (WBC) count    seeing Dr. [name] at [employer] on 10/25  . Atrial fibrillation ([place])   . BPH (benign prostatic hypertrophy)   . Bradycardia   . CAD (coronary artery disease)   . Dysrhythmia    bradycardia - sees Dr. Humphrey Rolls  . Esophageal cancer ([place]) 05/2016  . GERD (gastroesophageal reflux disease)   . History of kidney stones   . Hyperlipidemia   . Hypertension   . Hypothyroidism   . Kidney stone   . Myocardial infarction ([place]) 2005  . S/P angioplasty with stent 2005   x 4 vessels  . Sleep apnea    mild-NO CPAP  . Spinal stenosis    lumbar     Past Surgical History: Past Surgical History:  Procedure Laterality Date  . BACK SURGERY    . CARDIAC CATHETERIZATION Left 03/12/2016   Procedure: Left Heart Cath and Coronary Angiography;  Surgeon: [name], MD;  Location: [place] [facility];  Service: Cardiovascular;  Laterality: Left;  . CARDIAC CATHETERIZATION N/A 03/12/2016   Procedure: Coronary Stent Intervention;  Surgeon: Yolonda Kida, MD;  Location: [place] [facility];  Service: Cardiovascular;  Laterality: N/A;  . COLONOSCOPY WITH PROPOFOL N/A 07/13/2015   Procedure: COLONOSCOPY WITH PROPOFOL;  Surgeon: Lucilla Lame, MD;  Location: [place];  Service: Endoscopy;  Laterality: N/A;  . CORONARY ANGIOPLASTY  10/05 and 12/05   4 DES placed  . CYSTOSCOPY W/ RETROGRADES Left  06/15/2019   Procedure: CYSTOSCOPY WITH RETROGRADE PYELOGRAM;  Surgeon: Abbie Sons, MD;  Location: [facility];  Service: Urology;  Laterality: Left;  . CYSTOSCOPY/URETEROSCOPY/HOLMIUM LASER/STENT PLACEMENT Left 06/15/2019   Procedure: CYSTOSCOPY/URETEROSCOPY/HOLMIUM LASER/STENT PLACEMENT;  Surgeon: Abbie Sons, MD;  Location: [facility];  Service: Urology;  Laterality: Left;  . ESOPHAGECTOMY  10/07/2016   @ [facility]  . ESOPHAGOGASTRODUODENOSCOPY (EGD) WITH PROPOFOL N/A 06/14/2016   Procedure: ESOPHAGOGASTRODUODENOSCOPY (EGD) WITH PROPOFOL;  Surgeon: [name], MD;  Location: [facility] ENDOSCOPY;  Service: Endoscopy;  Laterality: N/A;  . HERNIA REPAIR Bilateral    x3  . INGUINAL HERNIA REPAIR Right 08/28/2017   Large PerFix plug, recurrent hernia;  Surgeon: Robert Bellow, MD;  Location: [facility];  Service: General;  Laterality: Right;  recurrent  . KIDNEY STONE SURGERY  07/15/2019  . POLYPECTOMY  07/13/2015   Procedure: POLYPECTOMY;  Surgeon: Lucilla Lame, MD;  Location: [place];  Service: Endoscopy;;  . PORT-A-CATH REMOVAL N/A 01/16/2017   Procedure: REMOVAL PORT-A-CATH;  Surgeon: Olean Ree, MD;  Location: [facility];  Service: General;  Laterality: N/A;  . PORTACATH PLACEMENT N/A 07/08/2016   Procedure: INSERTION PORT-A-CATH;  Surgeon: Olean Ree, MD;  Location: [facility];  Service: General;  Laterality: N/A;    Home Medications: Prior to Admission medications   Medication Sig Start Date End Date Taking? Authorizing Provider  acetaminophen (TYLENOL) 500 MG tablet Take 500 mg by mouth every 6 (six) hours  as needed for mild pain or moderate pain.   Yes [provider]  aspirin 81 MG tablet Take 81 mg by mouth daily.    Yes [provider]  atorvastatin (LIPITOR) 40 MG tablet Take 1 tablet (40 mg total) by mouth daily. 08/27/18  Yes Crissman, Jeannette How, MD  clopidogrel (PLAVIX) 75 MG tablet Take 1 tablet (75 mg total) by mouth daily. 06/14/19  Yes [name], MD  isosorbide mononitrate (IMDUR) 30 MG 24 hr tablet Take 30 mg by mouth every evening.  06/12/15  Yes [provider]  levothyroxine (SYNTHROID, LEVOTHROID) 50 MCG tablet Take 1 tablet (50 mcg total) by mouth daily. 08/27/18  Yes Crissman, Jeannette How, MD  lisinopril (PRINIVIL,ZESTRIL) 10 MG tablet Take 1 tablet (10 mg total) by mouth every morning. Patient taking differently: Take 10 mg by mouth daily.  08/27/18  Yes Crissman, Jeannette How, MD  metoprolol succinate (TOPROL-XL) 25 MG 24 hr tablet Take 25 mg by mouth daily.   Yes [provider]  multivitamin-iron-minerals-folic acid (CENTRUM) chewable tablet Chew 1 tablet by mouth daily.   Yes [provider]  nitroGLYCERIN (NITROSTAT) 0.4 MG SL tablet Place 1 tablet under the tongue every 5 (five) minutes as needed for chest pain.  03/08/16  Yes [provider]  pantoprazole (PROTONIX) 20 MG tablet Take 1 tablet (20 mg total) by mouth daily. 08/10/18  Yes Crissman, Jeannette How, MD  lidocaine-prilocaine (EMLA) cream Apply to affected area once Patient taking differently: Apply 1 application topically once. Apply to affected area once 08/26/19   [name], MD  ondansetron (ZOFRAN) 8 MG tablet Take 1 tablet (8 mg total) by mouth 2 (two) times daily as needed for refractory nausea / vomiting. Start on day 3 after chemotherapy. 08/26/19   [name], MD  prochlorperazine (COMPAZINE) 10 MG tablet Take 1 tablet (10 mg total) by mouth every 6 (six) hours as needed (Nausea or vomiting). 08/26/19   [name], MD    Allergies: No Known Allergies  Social History:  reports that he quit smoking about 15 years ago. His smoking use included cigarettes. He has a 40.00 pack-year smoking history. He has quit using smokeless tobacco.  His smokeless tobacco use included chew. He reports current alcohol use of about 1.0 standard drinks of alcohol per week. He reports that he does not use drugs.   Family  History: Family History  Problem Relation Age of Onset  . Hypertension Mother   . Dementia Mother   . Heart disease Father   . Hypertension Father   . Kidney disease Father   . Hypertension Brother   . Heart disease Brother   . Diabetes Son   . Hypertension Son   . Hyperlipidemia Son   . Hyperlipidemia Daughter   . Hypertension Daughter     Review of Systems: Review of Systems  Constitutional: Negative for chills and fever.  HENT: Negative for hearing loss.   Respiratory: Negative for shortness of breath.   Cardiovascular: Negative for chest pain.  Gastrointestinal: Negative for abdominal pain, nausea and vomiting.  Genitourinary: Negative for dysuria.  Musculoskeletal: Negative for myalgias.  Skin: Negative for rash.  Neurological: Negative for dizziness.  Psychiatric/Behavioral: Negative for depression.    Physical Exam BP 120/80   Pulse 73   Temp 98.2 F (36.8 C) (Temporal)   Resp 17   SpO2 99%  CONSTITUTIONAL: No acute distress HEENT:  Normocephalic, atraumatic, extraocular motion intact. NECK: Trachea is midline,  and there is no jugular venous distension.  RESPIRATORY:  Lungs are clear, and breath sounds are equal bilaterally. Normal respiratory effort without pathologic use of accessory muscles. CARDIOVASCULAR: Heart is regular without murmurs, gallops, or rubs. GI: The abdomen is soft, non-distended, non-tender. MUSCULOSKELETAL:  Normal muscle strength and tone in all four extremities.  No peripheral edema or cyanosis. SKIN: Skin turgor is normal. There are no pathologic skin lesions.  NEUROLOGIC:  Motor and sensation is grossly normal.  Cranial nerves are grossly intact. PSYCH:  Alert and oriented to person, place and time. Affect is normal.  Laboratory Analysis: No results found for this or any previous visit (from the past 24 hour(s)).  Imaging: No results found.  Assessment and Plan: This is a 73 y.o. male with recurrent esophageal  cancer.  Discussed with him the risks of bleeding, infection, and injury to surrounding structures.  He's willing to proceed.  Since his prior port was on the left subclavian, will attempt accessing the right subclavian today instead.  Patient is in agreement.    [name], MD [facility] Pg:  [phone]

## 2019-08-30 NOTE — Anesthesia Preprocedure Evaluation (Signed)
Anesthesia Evaluation  Patient identified by MRN, date of birth, ID band Patient awake    Reviewed: Allergy & Precautions, NPO status , Patient's Chart, lab work & pertinent test results  History of Anesthesia Complications Negative for: history of anesthetic complications  Airway Mallampati: II  TM Distance: >3 FB Neck ROM: Full    Dental no notable dental hx.    Pulmonary sleep apnea , neg COPD, former smoker,    breath sounds clear to auscultation- rhonchi (-) wheezing      Cardiovascular hypertension, + CAD, + Past MI and + Cardiac Stents (4 stents in 2005, one stent in 2017)  + dysrhythmias  Rhythm:Regular Rate:Normal - Systolic murmurs and - Diastolic murmurs    Neuro/Psych neg Seizures negative neurological ROS  negative psych ROS   GI/Hepatic Neg liver ROS, GERD  ,  Endo/Other  neg diabetesHypothyroidism   Renal/GU Renal disease: hx of nephrolithiasis.     Musculoskeletal negative musculoskeletal ROS (+)   Abdominal (+) - obese,   Peds  Hematology negative hematology ROS (+)   Anesthesia Other Findings Past Medical History: No date: Abnormal white blood cell (WBC) count     Comment:  seeing Dr. [name] at [facility] on 10/25 No date: Atrial fibrillation ([place]) No date: BPH (benign prostatic hypertrophy) No date: Bradycardia No date: CAD (coronary artery disease) No date: Dysrhythmia     Comment:  bradycardia - sees Dr. Humphrey Rolls 05/2016: Esophageal cancer ([place]) No date: GERD (gastroesophageal reflux disease) No date: History of kidney stones No date: Hyperlipidemia No date: Hypertension No date: Hypothyroidism No date: Kidney stone 2005: Myocardial infarction [facility]) 2005: S/P angioplasty with stent     Comment:  x 4 vessels No date: Sleep apnea     Comment:  mild-NO CPAP No date: Spinal stenosis     Comment:  lumbar   Reproductive/Obstetrics negative OB ROS                              Anesthesia Physical Anesthesia Plan  ASA: III  Anesthesia Plan: General   Post-op Pain Management:    Induction: Intravenous  PONV Risk Score and Plan: 1 and Propofol infusion  Airway Management Planned: Natural Airway  Additional Equipment:   Intra-op Plan:   Post-operative Plan:   Informed Consent: I have reviewed the patients History and Physical, chart, labs and discussed the procedure including the risks, benefits and alternatives for the proposed anesthesia with the patient or authorized representative who has indicated his/her understanding and acceptance.     Dental advisory given  Plan Discussed with: CRNA and Anesthesiologist  Anesthesia Plan Comments:         Anesthesia Quick Evaluation

## 2019-08-30 NOTE — Anesthesia Post-op Follow-up Note (Deleted)
Anesthesia QCDR form completed.        

## 2019-08-30 NOTE — Anesthesia Procedure Notes (Signed)
Procedure Name: LMA Insertion Performed by: Jonas Goh, CRNA Pre-anesthesia Checklist: Patient identified, Patient being monitored, Timeout performed, Emergency Drugs available and Suction available Patient Re-evaluated:Patient Re-evaluated prior to induction Oxygen Delivery Method: Circle system utilized Preoxygenation: Pre-oxygenation with 100% oxygen Induction Type: IV induction Ventilation: Mask ventilation without difficulty LMA: LMA inserted LMA Size: 4.5 Tube type: Oral Number of attempts: 1 Placement Confirmation: positive ETCO2 and breath sounds checked- equal and bilateral Tube secured with: Tape Dental Injury: Teeth and Oropharynx as per pre-operative assessment        

## 2019-08-31 NOTE — Anesthesia Postprocedure Evaluation (Signed)
Anesthesia Post Note  Patient: Corey Perry  Procedure(s) Performed: INSERTION PORT-A-CATH (Right )  Patient location during evaluation: PACU Anesthesia Type: General Level of consciousness: awake and alert and oriented Pain management: pain level controlled Vital Signs Assessment: post-procedure vital signs reviewed and stable Respiratory status: spontaneous breathing, nonlabored ventilation and respiratory function stable Cardiovascular status: blood pressure returned to baseline and stable Postop Assessment: no signs of nausea or vomiting Anesthetic complications: no     Last Vitals:  Vitals:   08/30/19 1710 08/30/19 1725  BP: 122/74 118/74  Pulse: 68 68  Resp: 18 16  Temp: 36.4 C   SpO2: 96% 97%    Last Pain:  Vitals:   08/30/19 1725  TempSrc:   PainSc: 0-No pain                 Mashell Sieben

## 2019-09-01 ENCOUNTER — Other Ambulatory Visit: Payer: Self-pay

## 2019-09-01 ENCOUNTER — Inpatient Hospital Stay (HOSPITAL_BASED_OUTPATIENT_CLINIC_OR_DEPARTMENT_OTHER): Payer: Medicare Other | Admitting: Hospice and Palliative Medicine

## 2019-09-01 ENCOUNTER — Inpatient Hospital Stay (HOSPITAL_BASED_OUTPATIENT_CLINIC_OR_DEPARTMENT_OTHER): Payer: Medicare Other | Admitting: Oncology

## 2019-09-01 ENCOUNTER — Encounter: Payer: Self-pay | Admitting: Oncology

## 2019-09-01 ENCOUNTER — Inpatient Hospital Stay: Payer: Medicare Other

## 2019-09-01 VITALS — BP 111/68 | HR 73 | Temp 97.3°F | Resp 18 | Wt 141.2 lb

## 2019-09-01 DIAGNOSIS — C155 Malignant neoplasm of lower third of esophagus: Secondary | ICD-10-CM | POA: Diagnosis not present

## 2019-09-01 DIAGNOSIS — Z515 Encounter for palliative care: Secondary | ICD-10-CM | POA: Diagnosis not present

## 2019-09-01 DIAGNOSIS — I2583 Coronary atherosclerosis due to lipid rich plaque: Secondary | ICD-10-CM

## 2019-09-01 DIAGNOSIS — I2510 Atherosclerotic heart disease of native coronary artery without angina pectoris: Secondary | ICD-10-CM | POA: Diagnosis not present

## 2019-09-01 DIAGNOSIS — Z5111 Encounter for antineoplastic chemotherapy: Secondary | ICD-10-CM | POA: Diagnosis not present

## 2019-09-01 LAB — COMPREHENSIVE METABOLIC PANEL
ALT: 19 U/L (ref 0–44)
AST: 20 U/L (ref 15–41)
Albumin: 3.5 g/dL (ref 3.5–5.0)
Alkaline Phosphatase: 61 U/L (ref 38–126)
Anion gap: 8 (ref 5–15)
BUN: 24 mg/dL — ABNORMAL HIGH (ref 8–23)
CO2: 28 mmol/L (ref 22–32)
Calcium: 8.5 mg/dL — ABNORMAL LOW (ref 8.9–10.3)
Chloride: 102 mmol/L (ref 98–111)
Creatinine, Ser: 1.14 mg/dL (ref 0.61–1.24)
GFR calc Af Amer: >60 mL/min (ref >60)
GFR calc non Af Amer: >60 mL/min (ref >60)
Glucose, Bld: 112 mg/dL — ABNORMAL HIGH (ref 70–99)
Potassium: 4.1 mmol/L (ref 3.5–5.1)
Sodium: 138 mmol/L (ref 135–145)
Total Bilirubin: 0.6 mg/dL (ref 0.3–1.2)
Total Protein: 7 g/dL (ref 6.5–8.1)

## 2019-09-01 LAB — CBC WITH DIFFERENTIAL/PLATELET
Abs Immature Granulocytes: 0.03 10*3/uL (ref 0.00–0.07)
Basophils Absolute: 0 10*3/uL (ref 0.0–0.1)
Basophils Relative: 0 %
Eosinophils Absolute: 0.1 10*3/uL (ref 0.0–0.5)
Eosinophils Relative: 2 %
HCT: 33.5 % — ABNORMAL LOW (ref 39.0–52.0)
Hemoglobin: 10.7 g/dL — ABNORMAL LOW (ref 13.0–17.0)
Immature Granulocytes: 0 %
Lymphocytes Relative: 8 %
Lymphs Abs: 0.6 10*3/uL — ABNORMAL LOW (ref 0.7–4.0)
MCH: 32.6 pg (ref 26.0–34.0)
MCHC: 31.9 g/dL (ref 30.0–36.0)
MCV: 102.1 fL — ABNORMAL HIGH (ref 80.0–100.0)
Monocytes Absolute: 0.7 10*3/uL (ref 0.1–1.0)
Monocytes Relative: 9 %
Neutro Abs: 6.1 10*3/uL (ref 1.7–7.7)
Neutrophils Relative %: 81 %
Platelets: 182 10*3/uL (ref 150–400)
RBC: 3.28 MIL/uL — ABNORMAL LOW (ref 4.22–5.81)
RDW: 13.4 % (ref 11.5–15.5)
WBC: 7.5 10*3/uL (ref 4.0–10.5)
nRBC: 0 % (ref 0.0–0.2)

## 2019-09-01 MED ORDER — SODIUM CHLORIDE 0.9 % IV SOLN
2400.0000 mg/m2 | INTRAVENOUS | Status: DC
  Administered 2019-09-01: 13:00:00 4200 mg via INTRAVENOUS
  Filled 2019-09-01: qty 84

## 2019-09-01 MED ORDER — FLUOROURACIL CHEMO INJECTION 2.5 GM/50ML
400.0000 mg/m2 | Freq: Once | INTRAVENOUS | Status: AC
  Administered 2019-09-01: 13:00:00 700 mg via INTRAVENOUS
  Filled 2019-09-01: qty 14

## 2019-09-01 MED ORDER — LEUCOVORIN CALCIUM INJECTION 350 MG
700.0000 mg | Freq: Once | INTRAVENOUS | Status: AC
  Administered 2019-09-01: 11:00:00 700 mg via INTRAVENOUS
  Filled 2019-09-01: qty 17.5

## 2019-09-01 MED ORDER — PALONOSETRON HCL INJECTION 0.25 MG/5ML
0.2500 mg | Freq: Once | INTRAVENOUS | Status: AC
  Administered 2019-09-01: 0.25 mg via INTRAVENOUS
  Filled 2019-09-01: qty 5

## 2019-09-01 MED ORDER — HEPARIN SOD (PORK) LOCK FLUSH 100 UNIT/ML IV SOLN
500.0000 [IU] | Freq: Once | INTRAVENOUS | Status: DC
  Filled 2019-09-01: qty 5

## 2019-09-01 MED ORDER — SODIUM CHLORIDE 0.9% FLUSH
10.0000 mL | Freq: Once | INTRAVENOUS | Status: AC
  Administered 2019-09-01: 08:00:00 10 mL via INTRAVENOUS
  Filled 2019-09-01: qty 10

## 2019-09-01 MED ORDER — OXALIPLATIN CHEMO INJECTION 100 MG/20ML
85.0000 mg/m2 | Freq: Once | INTRAVENOUS | Status: AC
  Administered 2019-09-01: 11:00:00 150 mg via INTRAVENOUS
  Filled 2019-09-01: qty 20

## 2019-09-01 MED ORDER — SODIUM CHLORIDE 0.9 % IV SOLN
10.0000 mg | Freq: Once | INTRAVENOUS | Status: AC
  Administered 2019-09-01: 10:00:00 10 mg via INTRAVENOUS
  Filled 2019-09-01: qty 1

## 2019-09-01 MED ORDER — DEXTROSE 5 % IV SOLN
Freq: Once | INTRAVENOUS | Status: AC
  Filled 2019-09-01: qty 250

## 2019-09-01 NOTE — Progress Notes (Signed)
[place]  [phone] [phone]   Name: Corey Perry Date: 09/01/2019 MRN: [id number]  DOB: 09-Aug-1946  Patient Care Team: [name], MD as PCP - General (Family Medicine) [name], MD as PCP - Family Medicine (Family Medicine) [name], MD as Consulting Physician (Oncology) [name], MD as Referring Physician [name], MD as Consulting Physician (Cardiology)    REASON FOR CONSULTATION: Corey Perry is a 73 y.o. male with multiple medical problems including recurrent adenocarcinoma of the esophagus status post chemotherapy/XRT in 2017 with esophagectomy on 10/07/2016.  Patient has history of dysphagia with esophageal stricture requiring dilation.  He has had recent worsening dysphagia with weight loss.  Patient was found to have recurrent adenocarcinoma in the lower third of the esophagus.  Patient was referred to palliative care to help address goals and manage ongoing symptoms.  SOCIAL HISTORY:     reports that he quit smoking about 15 years ago. His smoking use included cigarettes. He has a 40.00 pack-year smoking history. He has quit using smokeless tobacco.  His smokeless tobacco use included chew. He reports current alcohol use of about 1.0 standard drinks of alcohol per week. He reports that he does not use drugs.   Patient is married and lives at home with his wife.  He has a daughter who lives in [place].  Patient had a son who died in his 64R from complications of COPD.  Patient worked in a [employer] techn[employer].  ADVANCE DIRECTIVES:  Not on file  CODE STATUS:  PAST MEDICAL HISTORY: Past Medical History:  Diagnosis Date  . Abnormal white blood cell (WBC) count    seeing Dr. [name] at [place] on 10/25  . Atrial fibrillation ([place])   . BPH (benign prostatic hypertrophy)   . Bradycardia   . CAD (coronary artery disease)   . Dysrhythmia    bradycardia - sees Dr. Humphrey Rolls  . Esophageal cancer ([place]) 05/2016  . GERD (gastroesophageal reflux disease)   . History of kidney stones   . Hyperlipidemia   . Hypertension   . Hypothyroidism   . Kidney stone   . Myocardial infarction ([place]) 2005  . S/P angioplasty with stent 2005   x 4 vessels  . Sleep apnea    mild-NO CPAP  . Spinal stenosis    lumbar    PAST SURGICAL HISTORY:  Past Surgical History:  Procedure Laterality Date  . BACK SURGERY    . CARDIAC CATHETERIZATION Left 03/12/2016   Procedure: Left Heart Cath and Coronary Angiography;  Surgeon: [name], MD;  Location: [facility];  Service: Cardiovascular;  Laterality: Left;  . CARDIAC CATHETERIZATION N/A 03/12/2016   Procedure: Coronary Stent Intervention;  Surgeon: Yolonda Kida, MD;  Location: [facility];  Service: Cardiovascular;  Laterality: N/A;  . COLONOSCOPY WITH PROPOFOL N/A 07/13/2015   Procedure: COLONOSCOPY WITH PROPOFOL;  Surgeon: Lucilla Lame, MD;  Location: [facility];  Service: Endoscopy;  Laterality: N/A;  . CORONARY ANGIOPLASTY  10/05 and 12/05   4 DES placed  . CYSTOSCOPY W/ RETROGRADES Left 06/15/2019   Procedure: CYSTOSCOPY WITH RETROGRADE PYELOGRAM;  Surgeon: Abbie Sons, MD;  Location: [facility];  Service: Urology;  Laterality: Left;  . CYSTOSCOPY/URETEROSCOPY/HOLMIUM LASER/STENT PLACEMENT Left 06/15/2019   Procedure: CYSTOSCOPY/URETEROSCOPY/HOLMIUM LASER/STENT PLACEMENT;  Surgeon: Abbie Sons, MD;  Location: [facility];  Service: Urology;  Laterality: Left;  . ESOPHAGECTOMY  10/07/2016   @  [facility]  . ESOPHAGOGASTRODUODENOSCOPY (EGD) WITH PROPOFOL N/A 06/14/2016   Procedure: ESOPHAGOGASTRODUODENOSCOPY (EGD) WITH PROPOFOL;  Surgeon: [name], MD;  [facility];  Service: Endoscopy;  Laterality: N/A;  . HERNIA REPAIR Bilateral    x3  . INGUINAL HERNIA REPAIR Right 08/28/2017   Large PerFix plug, recurrent hernia;  Surgeon: Robert Bellow,  MD;  Location: [facility];  Service: General;  Laterality: Right;  recurrent  . KIDNEY STONE SURGERY  07/15/2019  . POLYPECTOMY  07/13/2015   Procedure: POLYPECTOMY;  Surgeon: Lucilla Lame, MD;  Location: [place];  Service: Endoscopy;;  . PORT-A-CATH REMOVAL N/A 01/16/2017   Procedure: REMOVAL PORT-A-CATH;  Surgeon: Olean Ree, MD;  Location: [facility];  Service: General;  Laterality: N/A;  . PORTACATH PLACEMENT N/A 07/08/2016   Procedure: INSERTION PORT-A-CATH;  Surgeon: Olean Ree, MD;  Location: [facility];  Service: General;  Laterality: N/A;  . PORTACATH PLACEMENT Right 08/30/2019   Procedure: INSERTION PORT-A-CATH;  Surgeon: Olean Ree, MD;  Location: [facility];  Service: General;  Laterality: Right;    HEMATOLOGY/ONCOLOGY HISTORY:  Oncology History  Esophageal cancer ([place])  06/23/2016 Initial Diagnosis   Esophageal cancer ([place])   09/01/2019 -  Chemotherapy   The patient had palonosetron (ALOXI) injection 0.25 mg, 0.25 mg, Intravenous,  Once, 1 of 6 cycles leucovorin 704 mg in dextrose 5 % 250 mL infusion, 400 mg/m2 = 704 mg, Intravenous,  Once, 1 of 6 cycles oxaliplatin (ELOXATIN) 150 mg in dextrose 5 % 500 mL chemo infusion, 85 mg/m2 = 150 mg, Intravenous,  Once, 1 of 6 cycles fluorouracil (ADRUCIL) chemo injection 700 mg, 400 mg/m2 = 700 mg, Intravenous,  Once, 1 of 6 cycles fluorouracil (ADRUCIL) 4,200 mg in sodium chloride 0.9 % 66 mL chemo infusion, 2,400 mg/m2 = 4,200 mg, Intravenous, 1 Day/Dose, 1 of 6 cycles  for chemotherapy treatment.      ALLERGIES:  has No Known Allergies.  MEDICATIONS:  Current Outpatient Medications  Medication Sig Dispense Refill  . acetaminophen (TYLENOL) 500 MG tablet Take 500 mg by mouth every 6 (six) hours as needed for mild pain or moderate pain.    Marland Kitchen aspirin 81 MG tablet Take 81 mg by mouth daily.     Marland Kitchen atorvastatin (LIPITOR) 40 MG tablet Take 1 tablet (40 mg total) by mouth daily. 90 tablet 4  . clopidogrel (PLAVIX) 75 MG  tablet Take 1 tablet (75 mg total) by mouth daily. 90 tablet 2  . HYDROcodone-acetaminophen (NORCO/VICODIN) 5-325 MG tablet Take 1 tablet by mouth every 6 (six) hours as needed for moderate pain. 15 tablet 0  . isosorbide mononitrate (IMDUR) 30 MG 24 hr tablet Take 30 mg by mouth every evening.   5  . levothyroxine (SYNTHROID, LEVOTHROID) 50 MCG tablet Take 1 tablet (50 mcg total) by mouth daily. 90 tablet 4  . lidocaine-prilocaine (EMLA) cream Apply to affected area once (Patient taking differently: Apply 1 application topically once. Apply to affected area once) 30 g 3  . lisinopril (PRINIVIL,ZESTRIL) 10 MG tablet Take 1 tablet (10 mg total) by mouth every morning. (Patient taking differently: Take 10 mg by mouth daily. ) 90 tablet 4  . loperamide (IMODIUM A-D) 2 MG tablet Take 2 mg by mouth 4 (four) times daily as needed for diarrhea or loose stools.    . metoprolol succinate (TOPROL-XL) 25 MG 24 hr tablet Take 25 mg by mouth daily.    . multivitamin-iron-minerals-folic acid (CENTRUM) chewable tablet Chew 1 tablet by mouth daily.    Marland Kitchen  nitroGLYCERIN (NITROSTAT) 0.4 MG SL tablet Place 1 tablet under the tongue every 5 (five) minutes as needed for chest pain.   98  . ondansetron (ZOFRAN) 8 MG tablet Take 1 tablet (8 mg total) by mouth 2 (two) times daily as needed for refractory nausea / vomiting. Start on day 3 after chemotherapy. 60 tablet 2  . pantoprazole (PROTONIX) 20 MG tablet Take 1 tablet (20 mg total) by mouth daily. 90 tablet 4  . prochlorperazine (COMPAZINE) 10 MG tablet Take 1 tablet (10 mg total) by mouth every 6 (six) hours as needed (Nausea or vomiting). 60 tablet 2   No current facility-administered medications for this visit.   Facility-Administered Medications Ordered in Other Visits  Medication Dose Route Frequency Provider Last Rate Last Admin  . fluorouracil (ADRUCIL) 4,200 mg in sodium chloride 0.9 % 66 mL chemo infusion  2,400 mg/m2 (Treatment Plan Recorded) Intravenous 1 day  or 1 dose [name], MD      . fluorouracil (ADRUCIL) chemo injection 700 mg  400 mg/m2 (Treatment Plan Recorded) Intravenous Once [name], MD      . heparin lock flush 100 unit/mL  500 Units Intravenous Once [name], MD      . leucovorin 700 mg in dextrose 5 % 250 mL infusion  700 mg Intravenous Once [name], MD      . oxaliplatin (ELOXATIN) 150 mg in dextrose 5 % 500 mL chemo infusion  85 mg/m2 (Treatment Plan Recorded) Intravenous Once [name], MD        VITAL SIGNS: There were no vitals taken for this visit. There were no vitals filed for this visit.  Estimated body mass index is 20.85 kg/m as calculated from the following:   Height as of 06/23/19: 5' 9"  (1.753 m).   Weight as of an earlier encounter on 09/01/19: 141 lb 3.2 oz (64 kg).  LABS: CBC:    Component Value Date/Time   WBC 7.5 09/01/2019 0821   HGB 10.7 (L) 09/01/2019 0821   HGB 13.0 03/15/2019 1028   HCT 33.5 (L) 09/01/2019 0821   HCT 39.2 03/15/2019 1028   PLT 182 09/01/2019 0821   PLT 130 (L) 03/15/2019 1028   MCV 102.1 (H) 09/01/2019 0821   MCV 98 (H) 03/15/2019 1028   NEUTROABS 6.1 09/01/2019 0821   NEUTROABS 2.7 03/15/2019 1028   LYMPHSABS 0.6 (L) 09/01/2019 0821   LYMPHSABS 0.6 (L) 03/15/2019 1028   MONOABS 0.7 09/01/2019 0821   EOSABS 0.1 09/01/2019 0821   EOSABS 0.1 03/15/2019 1028   BASOSABS 0.0 09/01/2019 0821   BASOSABS 0.0 03/15/2019 1028   Comprehensive Metabolic Panel:    Component Value Date/Time   NA 138 09/01/2019 0821   NA 141 03/15/2019 1028   K 4.1 09/01/2019 0821   CL 102 09/01/2019 0821   CO2 28 09/01/2019 0821   BUN 24 (H) 09/01/2019 0821   BUN 23 03/15/2019 1028   CREATININE 1.14 09/01/2019 0821   GLUCOSE 112 (H) 09/01/2019 0821   CALCIUM 8.5 (L) 09/01/2019 0821   AST 20 09/01/2019 0821   AST 31 03/15/2019 1022   ALT 19 09/01/2019 0821   ALT 28 03/15/2019 1022   ALKPHOS 61 09/01/2019 0821   BILITOT 0.6 09/01/2019 0821    BILITOT 0.3 08/27/2018 1333   PROT 7.0 09/01/2019 0821   PROT 6.5 08/27/2018 1333   ALBUMIN 3.5 09/01/2019 0821   ALBUMIN 4.2 08/27/2018 1333    RADIOGRAPHIC STUDIES: DG Chest [place]  1 View  Result Date: 08/30/2019 CLINICAL DATA:  Port-A-Cath insertion EXAM: PORTABLE CHEST 1 VIEW COMPARISON:  Radiograph 12/21/2016, CT 06/26/2016 FINDINGS: A right subclavian approach Port-A-Cath tip terminates in the upper SVC. No pneumothorax or effusion. Patchy right perihilar opacity is noted. Some bandlike areas of scarring are noted in the right lung base. The cardiomediastinal contours are unremarkable. Degenerative changes are present in the imaged spine and shoulders. IMPRESSION: 1. Right subclavian approach Port-A-Cath tip terminates in the upper SVC. No pneumothorax or effusion. 2. Patchy right perihilar opacity may represent atelectasis or infiltrate. 3. Scarring in the right lung base. Electronically Signed   By: Lovena Le M.D.   On: 08/30/2019 16:53   DG C-Arm 1-60 Min-No Report  Result Date: 08/30/2019 Fluoroscopy was utilized by the requesting physician.  No radiographic interpretation.    PERFORMANCE STATUS (ECOG) : 1 - Symptomatic but completely ambulatory  Review of Systems Unless otherwise noted, a complete review of systems is negative.  Physical Exam General: NAD, frail appearing, thin Pulmonary: Unlabored Extremities: no edema, no joint deformities Skin: no rashes Neurological: Weakness but otherwise nonfocal  IMPRESSION: I met with patient in the infusion area.  Introduced palliative care services and attempted to establish therapeutic rapport.  Patient reports that he is doing reasonably well.  He denies any acute issues today.  He denies any distressing symptoms.  He has met with Dr. [name] and plan is to initiate FOLFOX.  However, patient had some concerns with the treatment plan after reading the side effect profile.  Patient says that he has decided to proceed with  treatment but reserves the option of stopping should the symptom burden become too great.  Patient says that at baseline, he lives at home with his wife.  He describes her health as being somewhat poor given severe peripheral neuropathy.  Patient says that he is functionally independent with all of his own care.  He has a daughter in [place] who is involved.  Patient describes significant weight loss over the last few months.  His weight decreased from 150 pounds to as low as 139 pounds earlier this month.  Patient was having difficulty swallowing food and medications but says that has improved following esophageal dilation.  We did discuss the importance of maintaining a high caloric diet.  He is drinking oral supplements on occasion and he was encouraged to take them regularly.  Will refer to dietitian.  We will also refer to speech therapy to obtain baseline evaluation.  We will need to have more conversations in the future regarding goals and medical decision making.  Will benefit from completing ACP documents/MOST Form.  Case and plan discussed with Dr. [name]  PLAN: -Continue current scope of treatment -Recommend oral supplements 3 times daily -Referral to RD and ST -We will plan to discuss ACP/MOST Form during a future visit -RTC in 2 to 3 weeks   Patient expressed understanding and was in agreement with this plan. He also understands that He can call the clinic at any time with any questions, concerns, or complaints.     Time Total: 30 minutes  Visit consisted of counseling and education dealing with the complex and emotionally intense issues of symptom management and palliative care in the setting of serious and potentially life-threatening illness.Greater than 50%  of this time was spent counseling and coordinating care related to the above assessment and plan.  Signed by: Altha Harm, PhD, NP-C

## 2019-09-01 NOTE — Progress Notes (Signed)
Pt here for first day of treatment. Reports issues with diarrhea, most days he states "food runs right through me." Denies vomiting.

## 2019-09-02 NOTE — Progress Notes (Signed)
[place]  [phone] [phone]  ID: Corey Perry OB: [birth year]  MR#: [id number]  UUV#:[id number]  Patient Care Team: [name], MD as PCP - General (Family Medicine) [name], MD as PCP - Family Medicine (Family Medicine) [name], MD as Consulting Physician (Oncology) [name], MD as Referring Physician [name], MD as Consulting Physician (Cardiology)  CHIEF COMPLAINT: Recurrent adenocarcinoma of the lower third of the esophagus.  INTERVAL HISTORY: Patient returns to clinic today for further evaluation and to assess his toleration of cycle 1 of palliative FOLFOX.  He had epistaxis last night that was difficult to control, but has since resolved.  He otherwise tolerated his treatments well without significant side effects.  He denies dysphagia, but continues to have difficulty swallowing.  He has no neurologic complaints.  He denies any recent fevers or illnesses.  He denies any chest pain, shortness of breath, cough, or hemoptysis.  He denies any nausea, vomiting, or constipation.  He continues to have occasional diarrhea.  He has no melena or hematochezia.  He has no urinary complaints.  Patient offers no further specific complaints today.  REVIEW OF SYSTEMS:   Review of Systems  Constitutional: Positive for malaise/fatigue and weight loss. Negative for fever.  HENT: Negative.   Respiratory: Negative.  Negative for cough and shortness of breath.   Cardiovascular: Negative.  Negative for chest pain and leg swelling.  Gastrointestinal: Positive for diarrhea. Negative for abdominal pain, blood in stool, constipation, melena, nausea and vomiting.  Genitourinary: Negative.  Negative for frequency and urgency.  Musculoskeletal: Negative.  Negative for back pain.  Skin: Negative.  Negative for rash.  Neurological: Positive for weakness. Negative for tingling, focal weakness and headaches.    Psychiatric/Behavioral: Negative.  The patient is not nervous/anxious and does not have insomnia.     As per HPI. Otherwise, a complete review of systems is negative.  PAST MEDICAL HISTORY: Past Medical History:  Diagnosis Date  . Abnormal white blood cell (WBC) count    seeing Dr. [name] at [place] on 10/25  . Atrial fibrillation ([place])   . BPH (benign prostatic hypertrophy)   . Bradycardia   . CAD (coronary artery disease)   . Dysrhythmia    bradycardia - sees Dr. Humphrey Rolls  . Esophageal cancer ([place]) 05/2016  . GERD (gastroesophageal reflux disease)   . History of kidney stones   . Hyperlipidemia   . Hypertension   . Hypothyroidism   . Kidney stone   . Myocardial infarction ([place]) 2005  . S/P angioplasty with stent 2005   x 4 vessels  . Sleep apnea    mild-NO CPAP  . Spinal stenosis    lumbar    PAST SURGICAL HISTORY: Past Surgical History:  Procedure Laterality Date  . BACK SURGERY    . CARDIAC CATHETERIZATION Left 03/12/2016   Procedure: Left Heart Cath and Coronary Angiography;  Surgeon: [name], MD;  Location: [facility] [facility] [place] [facility];  Service: Cardiovascular;  Laterality: Left;  . CARDIAC CATHETERIZATION N/A 03/12/2016   Procedure: Coronary Stent Intervention;  Surgeon: Yolonda Kida, MD;  Location: [place] [facility];  Service: Cardiovascular;  Laterality: N/A;  . COLONOSCOPY WITH PROPOFOL N/A 07/13/2015   Procedure: COLONOSCOPY WITH PROPOFOL;  Surgeon: Lucilla Lame, MD;  Location: [facility];  Service: Endoscopy;  Laterality: N/A;  . CORONARY ANGIOPLASTY  10/05 and 12/05   4 DES placed  . CYSTOSCOPY W/ RETROGRADES Left 06/15/2019  Procedure: CYSTOSCOPY WITH RETROGRADE PYELOGRAM;  Surgeon: Abbie Sons, MD;  Location: [facility];  Service: Urology;  Laterality: Left;  . CYSTOSCOPY/URETEROSCOPY/HOLMIUM LASER/STENT PLACEMENT Left 06/15/2019   Procedure: CYSTOSCOPY/URETEROSCOPY/HOLMIUM LASER/STENT PLACEMENT;  Surgeon: Abbie Sons, MD;  Location:  [facility];  Service: Urology;  Laterality: Left;  . ESOPHAGECTOMY  10/07/2016   @ [facility]  . ESOPHAGOGASTRODUODENOSCOPY (EGD) WITH PROPOFOL N/A 06/14/2016   Procedure: ESOPHAGOGASTRODUODENOSCOPY (EGD) WITH PROPOFOL;  Surgeon: [name], MD;  Location: [facility];  Service: Endoscopy;  Laterality: N/A;  . HERNIA REPAIR Bilateral    x3  . INGUINAL HERNIA REPAIR Right 08/28/2017   Large PerFix plug, recurrent hernia;  Surgeon: Robert Bellow, MD;  Location: [facility];  Service: General;  Laterality: Right;  recurrent  . KIDNEY STONE SURGERY  07/15/2019  . POLYPECTOMY  07/13/2015   Procedure: POLYPECTOMY;  Surgeon: Lucilla Lame, MD;  Location: [place];  Service: Endoscopy;;  . PORT-A-CATH REMOVAL N/A 01/16/2017   Procedure: REMOVAL PORT-A-CATH;  Surgeon: Olean Ree, MD;  Location: [facility];  Service: General;  Laterality: N/A;  . PORTACATH PLACEMENT N/A 07/08/2016   Procedure: INSERTION PORT-A-CATH;  Surgeon: Olean Ree, MD;  Location: [facility];  Service: General;  Laterality: N/A;  . PORTACATH PLACEMENT Right 08/30/2019   Procedure: INSERTION PORT-A-CATH;  Surgeon: Olean Ree, MD;  Location: [facility];  Service: General;  Laterality: Right;    FAMILY HISTORY Family History  Problem Relation Age of Onset  . Hypertension Mother   . Dementia Mother   . Heart disease Father   . Hypertension Father   . Kidney disease Father   . Hypertension Brother   . Heart disease Brother   . Diabetes Son   . Hypertension Son   . Hyperlipidemia Son   . Hyperlipidemia Daughter   . Hypertension Daughter        ADVANCED DIRECTIVES:    HEALTH MAINTENANCE: Social History   Tobacco Use  . Smoking status: Former Smoker    Packs/day: 1.00    Years: 40.00    Pack years: 40.00    Types: Cigarettes    Quit date: 07/16/2004    Years since quitting: 15.1  . Smokeless tobacco: Former Systems developer    Types: Chew  Substance Use Topics  . Alcohol use: Yes    Alcohol/week: 1.0  standard drinks    Types: 1 Cans of beer per week  . Drug use: No     Colonoscopy:  PAP:  Bone density:  Lipid panel:  No Known Allergies  Current Outpatient Medications  Medication Sig Dispense Refill  . acetaminophen (TYLENOL) 500 MG tablet Take 500 mg by mouth every 6 (six) hours as needed for mild pain or moderate pain.    Marland Kitchen aspirin 81 MG tablet Take 81 mg by mouth daily.     Marland Kitchen atorvastatin (LIPITOR) 40 MG tablet Take 1 tablet (40 mg total) by mouth daily. 90 tablet 4  . clopidogrel (PLAVIX) 75 MG tablet Take 1 tablet (75 mg total) by mouth daily. 90 tablet 2  . HYDROcodone-acetaminophen (NORCO/VICODIN) 5-325 MG tablet Take 1 tablet by mouth every 6 (six) hours as needed for moderate pain. 15 tablet 0  . isosorbide mononitrate (IMDUR) 30 MG 24 hr tablet Take 30 mg by mouth every evening.   5  . levothyroxine (SYNTHROID, LEVOTHROID) 50 MCG tablet Take 1 tablet (50 mcg total) by mouth daily. 90 tablet 4  . lidocaine-prilocaine (EMLA) cream Apply to affected area once (Patient taking differently: Apply  1 application topically once. Apply to affected area once) 30 g 3  . lisinopril (ZESTRIL) 10 MG tablet Take 1 tablet (10 mg total) by mouth every morning. 90 tablet 0  . loperamide (IMODIUM A-D) 2 MG tablet Take 2 mg by mouth 4 (four) times daily as needed for diarrhea or loose stools.    . metoprolol succinate (TOPROL-XL) 25 MG 24 hr tablet Take 25 mg by mouth daily.    . multivitamin-iron-minerals-folic acid (CENTRUM) chewable tablet Chew 1 tablet by mouth daily.    . nitroGLYCERIN (NITROSTAT) 0.4 MG SL tablet Place 1 tablet under the tongue every 5 (five) minutes as needed for chest pain.   98  . ondansetron (ZOFRAN) 8 MG tablet Take 1 tablet (8 mg total) by mouth 2 (two) times daily as needed for refractory nausea / vomiting. Start on day 3 after chemotherapy. 60 tablet 2  . pantoprazole (PROTONIX) 20 MG tablet Take 1 tablet (20 mg total) by mouth daily. 90 tablet 0  .  prochlorperazine (COMPAZINE) 10 MG tablet Take 1 tablet (10 mg total) by mouth every 6 (six) hours as needed (Nausea or vomiting). 60 tablet 2   No current facility-administered medications for this visit.    OBJECTIVE: Vitals:   09/08/19 1102  BP: 113/68  Pulse: 64  Temp: (!) 97.1 F (36.2 C)     Body mass index is 20.51 kg/m.    ECOG FS:0 - Asymptomatic  General: Thin, no acute distress. Eyes: Pink conjunctiva, anicteric sclera. HEENT: Normocephalic, moist mucous membranes. Lungs: No audible wheezing or coughing. Heart: Regular rate and rhythm. Abdomen: Soft, nontender, no obvious distention. Musculoskeletal: No edema, cyanosis, or clubbing. Neuro: Alert, answering all questions appropriately. Cranial nerves grossly intact. Skin: No rashes or petechiae noted. Psych: Normal affect.   LAB RESULTS:  Lab Results  Component Value Date   NA 138 09/08/2019   K 3.6 09/08/2019   CL 105 09/08/2019   CO2 26 09/08/2019   GLUCOSE 102 (H) 09/08/2019   BUN 22 09/08/2019   CREATININE 1.13 09/08/2019   CALCIUM 8.2 (L) 09/08/2019   PROT 6.7 09/08/2019   ALBUMIN 3.3 (L) 09/08/2019   AST 28 09/08/2019   ALT 26 09/08/2019   ALKPHOS 59 09/08/2019   BILITOT 0.7 09/08/2019   GFRNONAA >60 09/08/2019   GFRAA >60 09/08/2019    Lab Results  Component Value Date   WBC 4.1 09/08/2019   NEUTROABS 3.4 09/08/2019   HGB 10.2 (L) 09/08/2019   HCT 31.7 (L) 09/08/2019   MCV 100.0 09/08/2019   PLT 116 (L) 09/08/2019     STUDIES: DG Chest Port 1 View  Result Date: 08/30/2019 CLINICAL DATA:  Port-A-Cath insertion EXAM: PORTABLE CHEST 1 VIEW COMPARISON:  Radiograph 12/21/2016, CT 06/26/2016 FINDINGS: A right subclavian approach Port-A-Cath tip terminates in the upper SVC. No pneumothorax or effusion. Patchy right perihilar opacity is noted. Some bandlike areas of scarring are noted in the right lung base. The cardiomediastinal contours are unremarkable. Degenerative changes are present in  the imaged spine and shoulders. IMPRESSION: 1. Right subclavian approach Port-A-Cath tip terminates in the upper SVC. No pneumothorax or effusion. 2. Patchy right perihilar opacity may represent atelectasis or infiltrate. 3. Scarring in the right lung base. Electronically Signed   By: Lovena Le M.D.   On: 08/30/2019 16:53   DG C-Arm 1-60 Min-No Report  Result Date: 08/30/2019 Fluoroscopy was utilized by the requesting physician.  No radiographic interpretation.    ASSESSMENT: Recurrent adenocarcinoma of the lower third  of the esophagus.  PLAN:    1.  Recurrent adenocarcinoma of the lower third of the esophagus: Patient initially completed 6 cycles of weekly carboplatinum and Taxol on August 21, 2016 and daily XRT completed on August 27, 2016. Patient had his esophagectomy on October 07, 2016.  His most recent imaging at [facility] suggested recurrence which was confirmed with biopsy.  Per recommendation of [facility], will proceed with palliative FOLFOX.  Patient now has had port placement. Awaiting final pathology results to assess whether nivolumab will be of benefit.  Patient tolerated cycle 1 of FOLFOX last week relatively well.  Because of the Christmas holiday, patient will return to clinic in 2 weeks for further evaluation and consideration of cycle 2.  All future cycles will be given every 2 weeks.   2.  Weight loss: Previously, patient received a referral to dietary and palliative care. 3.  Thrombocytopenia: Mild, monitor. 4. Renal insufficiency: Resolved. 5.  Diarrhea: Continue OTC Imodium. 6.  Anemia: Patient's hemoglobin has trended down slightly to 10.2.  Monitor. 7.  Epistaxis: Unlikely related thrombocytopenia.  Monitor closely.   Patient expressed understanding and was in agreement with this plan. He also understands that He can call clinic at any time with any questions, concerns, or complaints.    [name], MD 09/08/19 1:49 PM

## 2019-09-03 ENCOUNTER — Other Ambulatory Visit: Payer: Self-pay

## 2019-09-03 ENCOUNTER — Inpatient Hospital Stay: Payer: Medicare Other

## 2019-09-03 VITALS — BP 122/77 | HR 74 | Temp 98.0°F | Resp 18

## 2019-09-03 DIAGNOSIS — C155 Malignant neoplasm of lower third of esophagus: Secondary | ICD-10-CM

## 2019-09-03 DIAGNOSIS — Z5111 Encounter for antineoplastic chemotherapy: Secondary | ICD-10-CM | POA: Diagnosis not present

## 2019-09-03 MED ORDER — HEPARIN SOD (PORK) LOCK FLUSH 100 UNIT/ML IV SOLN
500.0000 [IU] | Freq: Once | INTRAVENOUS | Status: AC | PRN
  Administered 2019-09-03: 13:00:00 500 [IU]
  Filled 2019-09-03: qty 5

## 2019-09-03 MED ORDER — HEPARIN SOD (PORK) LOCK FLUSH 100 UNIT/ML IV SOLN
INTRAVENOUS | Status: AC
  Filled 2019-09-03: qty 5

## 2019-09-06 ENCOUNTER — Other Ambulatory Visit: Payer: Self-pay

## 2019-09-06 DIAGNOSIS — K219 Gastro-esophageal reflux disease without esophagitis: Secondary | ICD-10-CM

## 2019-09-06 DIAGNOSIS — I10 Essential (primary) hypertension: Secondary | ICD-10-CM

## 2019-09-06 NOTE — Telephone Encounter (Signed)
Last regular OV was in June, HTN visit with Apolonio Schneiders 07/23/19, and f/up scheduled with Dr. Lenna Sciara in February.

## 2019-09-07 ENCOUNTER — Other Ambulatory Visit: Payer: Self-pay

## 2019-09-07 MED ORDER — LISINOPRIL 10 MG PO TABS: 10.0000 mg | ORAL_TABLET | ORAL | 0 refills | Status: DC

## 2019-09-07 MED ORDER — PANTOPRAZOLE SODIUM 20 MG PO TBEC: 20.0000 mg | DELAYED_RELEASE_TABLET | Freq: Every day | ORAL | 0 refills | Status: DC

## 2019-09-07 NOTE — Progress Notes (Signed)
Patient pre screened for office appointment, no questions or concerns today. Patient reminded of upcoming appointment time and date. 

## 2019-09-08 ENCOUNTER — Inpatient Hospital Stay (HOSPITAL_BASED_OUTPATIENT_CLINIC_OR_DEPARTMENT_OTHER): Payer: Medicare Other | Admitting: Oncology

## 2019-09-08 ENCOUNTER — Inpatient Hospital Stay (HOSPITAL_BASED_OUTPATIENT_CLINIC_OR_DEPARTMENT_OTHER): Payer: Medicare Other | Admitting: Hospice and Palliative Medicine

## 2019-09-08 ENCOUNTER — Other Ambulatory Visit: Payer: Self-pay

## 2019-09-08 ENCOUNTER — Inpatient Hospital Stay: Payer: Medicare Other

## 2019-09-08 VITALS — BP 113/68 | HR 64 | Temp 97.1°F | Wt 138.9 lb

## 2019-09-08 DIAGNOSIS — Z7189 Other specified counseling: Secondary | ICD-10-CM | POA: Diagnosis not present

## 2019-09-08 DIAGNOSIS — Z515 Encounter for palliative care: Secondary | ICD-10-CM

## 2019-09-08 DIAGNOSIS — Z5111 Encounter for antineoplastic chemotherapy: Secondary | ICD-10-CM | POA: Diagnosis not present

## 2019-09-08 DIAGNOSIS — I2510 Atherosclerotic heart disease of native coronary artery without angina pectoris: Secondary | ICD-10-CM

## 2019-09-08 DIAGNOSIS — C155 Malignant neoplasm of lower third of esophagus: Secondary | ICD-10-CM | POA: Diagnosis not present

## 2019-09-08 DIAGNOSIS — I2583 Coronary atherosclerosis due to lipid rich plaque: Secondary | ICD-10-CM | POA: Diagnosis not present

## 2019-09-08 LAB — CBC WITH DIFFERENTIAL/PLATELET
Abs Immature Granulocytes: 0.03 10*3/uL (ref 0.00–0.07)
Basophils Absolute: 0 10*3/uL (ref 0.0–0.1)
Basophils Relative: 1 %
Eosinophils Absolute: 0.1 10*3/uL (ref 0.0–0.5)
Eosinophils Relative: 2 %
HCT: 31.7 % — ABNORMAL LOW (ref 39.0–52.0)
Hemoglobin: 10.2 g/dL — ABNORMAL LOW (ref 13.0–17.0)
Immature Granulocytes: 1 %
Lymphocytes Relative: 12 %
Lymphs Abs: 0.5 10*3/uL — ABNORMAL LOW (ref 0.7–4.0)
MCH: 32.2 pg (ref 26.0–34.0)
MCHC: 32.2 g/dL (ref 30.0–36.0)
MCV: 100 fL (ref 80.0–100.0)
Monocytes Absolute: 0.1 10*3/uL (ref 0.1–1.0)
Monocytes Relative: 3 %
Neutro Abs: 3.4 10*3/uL (ref 1.7–7.7)
Neutrophils Relative %: 81 %
Platelets: 116 10*3/uL — ABNORMAL LOW (ref 150–400)
RBC: 3.17 MIL/uL — ABNORMAL LOW (ref 4.22–5.81)
RDW: 13.2 % (ref 11.5–15.5)
WBC: 4.1 10*3/uL (ref 4.0–10.5)
nRBC: 0 % (ref 0.0–0.2)

## 2019-09-08 LAB — COMPREHENSIVE METABOLIC PANEL
ALT: 26 U/L (ref 0–44)
AST: 28 U/L (ref 15–41)
Albumin: 3.3 g/dL — ABNORMAL LOW (ref 3.5–5.0)
Alkaline Phosphatase: 59 U/L (ref 38–126)
Anion gap: 7 (ref 5–15)
BUN: 22 mg/dL (ref 8–23)
CO2: 26 mmol/L (ref 22–32)
Calcium: 8.2 mg/dL — ABNORMAL LOW (ref 8.9–10.3)
Chloride: 105 mmol/L (ref 98–111)
Creatinine, Ser: 1.13 mg/dL (ref 0.61–1.24)
GFR calc Af Amer: >60 mL/min (ref >60)
GFR calc non Af Amer: >60 mL/min (ref >60)
Glucose, Bld: 102 mg/dL — ABNORMAL HIGH (ref 70–99)
Potassium: 3.6 mmol/L (ref 3.5–5.1)
Sodium: 138 mmol/L (ref 135–145)
Total Bilirubin: 0.7 mg/dL (ref 0.3–1.2)
Total Protein: 6.7 g/dL (ref 6.5–8.1)

## 2019-09-08 MED ORDER — HEPARIN SOD (PORK) LOCK FLUSH 100 UNIT/ML IV SOLN
500.0000 [IU] | Freq: Once | INTRAVENOUS | Status: AC
  Administered 2019-09-08: 11:00:00 500 [IU] via INTRAVENOUS
  Filled 2019-09-08: qty 5

## 2019-09-08 MED ORDER — SODIUM CHLORIDE 0.9% FLUSH
10.0000 mL | Freq: Once | INTRAVENOUS | Status: AC
  Administered 2019-09-08: 10 mL via INTRAVENOUS
  Filled 2019-09-08: qty 10

## 2019-09-08 MED ORDER — HEPARIN SOD (PORK) LOCK FLUSH 100 UNIT/ML IV SOLN
INTRAVENOUS | Status: AC
  Filled 2019-09-08: qty 5

## 2019-09-08 NOTE — Progress Notes (Signed)
[place]  Telephone:([phone] [phone]   Name: Corey Perry Date: 09/08/2019 MRN: [id number]  DOB: 1945-11-09  Patient Care Team: [name], MD as PCP - General (Family Medicine) [name], MD as PCP - Family Medicine (Family Medicine) [name], MD as Consulting Physician (Oncology) [name], MD as Referring Physician [name], MD as Consulting Physician (Cardiology)    REASON FOR CONSULTATION: Corey Perry is a 73 y.o. male with multiple medical problems including recurrent adenocarcinoma of the esophagus status post chemotherapy/XRT in 2017 with esophagectomy on 10/07/2016.  Patient has history of dysphagia with esophageal stricture requiring dilation.  He has had recent worsening dysphagia with weight loss.  Patient was found to have recurrent adenocarcinoma in the lower third of the esophagus.  Patient was referred to palliative care to help address goals and manage ongoing symptoms.  SOCIAL HISTORY:     reports that he quit smoking about 15 years ago. His smoking use included cigarettes. He has a 40.00 pack-year smoking history. He has quit using smokeless tobacco.  His smokeless tobacco use included chew. He reports current alcohol use of about 1.0 standard drinks of alcohol per week. He reports that he does not use drugs.   Patient is married and lives at home with his wife.  He has a daughter who lives in [place].  Patient had a son who died in his 37S from complications of COPD.  Patient worked in a [employer] techn[employer].  ADVANCE DIRECTIVES:  Not on file  CODE STATUS:  PAST MEDICAL HISTORY: Past Medical History:  Diagnosis Date  . Abnormal white blood cell (WBC) count    seeing Dr. [name] at [facility] on 10/25  . Atrial fibrillation ([place])   . BPH (benign prostatic hypertrophy)   . Bradycardia   . CAD (coronary artery disease)   . Dysrhythmia    bradycardia - sees Dr. Humphrey Rolls  . Esophageal cancer ([place]) 05/2016  . GERD (gastroesophageal reflux disease)   . History of kidney stones   . Hyperlipidemia   . Hypertension   . Hypothyroidism   . Kidney stone   . Myocardial infarction ([place]) 2005  . S/P angioplasty with stent 2005   x 4 vessels  . Sleep apnea    mild-NO CPAP  . Spinal stenosis    lumbar    PAST SURGICAL HISTORY:  Past Surgical History:  Procedure Laterality Date  . BACK SURGERY    . CARDIAC CATHETERIZATION Left 03/12/2016   Procedure: Left Heart Cath and Coronary Angiography;  Surgeon: [name], MD;  Location: [facility];  Service: Cardiovascular;  Laterality: Left;  . CARDIAC CATHETERIZATION N/A 03/12/2016   Procedure: Coronary Stent Intervention;  Surgeon: Yolonda Kida, MD;  Location: [facility];  Service: Cardiovascular;  Laterality: N/A;  . COLONOSCOPY WITH PROPOFOL N/A 07/13/2015   Procedure: COLONOSCOPY WITH PROPOFOL;  Surgeon: Lucilla Lame, MD;  Location: [facility];  Service: Endoscopy;  Laterality: N/A;  . CORONARY ANGIOPLASTY  10/05 and 12/05   4 DES placed  . CYSTOSCOPY W/ RETROGRADES Left 06/15/2019   Procedure: CYSTOSCOPY WITH RETROGRADE PYELOGRAM;  Surgeon: Abbie Sons, MD;  Location: [facility];  Service: Urology;  Laterality: Left;  . CYSTOSCOPY/URETEROSCOPY/HOLMIUM LASER/STENT PLACEMENT Left 06/15/2019   Procedure: CYSTOSCOPY/URETEROSCOPY/HOLMIUM LASER/STENT PLACEMENT;  Surgeon: Abbie Sons, MD;  Location: [facility];  Service: Urology;  Laterality: Left;  . ESOPHAGECTOMY  10/07/2016   @  [facility]  . ESOPHAGOGASTRODUODENOSCOPY (EGD) WITH PROPOFOL N/A 06/14/2016   Procedure: ESOPHAGOGASTRODUODENOSCOPY (EGD) WITH PROPOFOL;  Surgeon: [name], MD;  Location: [facility];  Service: Endoscopy;  Laterality: N/A;  . HERNIA REPAIR Bilateral    x3  . INGUINAL HERNIA REPAIR Right 08/28/2017   Large PerFix plug, recurrent hernia;  Surgeon: Robert Bellow,  MD;  Location: [facility];  Service: General;  Laterality: Right;  recurrent  . KIDNEY STONE SURGERY  07/15/2019  . POLYPECTOMY  07/13/2015   Procedure: POLYPECTOMY;  Surgeon: Lucilla Lame, MD;  Location: [facility];  Service: Endoscopy;;  . PORT-A-CATH REMOVAL N/A 01/16/2017   Procedure: REMOVAL PORT-A-CATH;  Surgeon: Olean Ree, MD;  Location: [facility];  Service: General;  Laterality: N/A;  . PORTACATH PLACEMENT N/A 07/08/2016   Procedure: INSERTION PORT-A-CATH;  Surgeon: Olean Ree, MD;  Location: [facility];  Service: General;  Laterality: N/A;  . PORTACATH PLACEMENT Right 08/30/2019   Procedure: INSERTION PORT-A-CATH;  Surgeon: Olean Ree, MD;  Location: [facility];  Service: General;  Laterality: Right;    HEMATOLOGY/ONCOLOGY HISTORY:  Oncology History  Esophageal cancer ([place])  06/23/2016 Initial Diagnosis   Esophageal cancer ([place])   09/01/2019 -  Chemotherapy   The patient had palonosetron (ALOXI) injection 0.25 mg, 0.25 mg, Intravenous,  Once, 1 of 6 cycles Administration: 0.25 mg (09/01/2019) leucovorin 700 mg in dextrose 5 % 250 mL infusion, 704 mg, Intravenous,  Once, 1 of 6 cycles Administration: 700 mg (09/01/2019) oxaliplatin (ELOXATIN) 150 mg in dextrose 5 % 500 mL chemo infusion, 85 mg/m2 = 150 mg, Intravenous,  Once, 1 of 6 cycles Administration: 150 mg (09/01/2019) fluorouracil (ADRUCIL) chemo injection 700 mg, 400 mg/m2 = 700 mg, Intravenous,  Once, 1 of 6 cycles Administration: 700 mg (09/01/2019) fluorouracil (ADRUCIL) 4,200 mg in sodium chloride 0.9 % 66 mL chemo infusion, 2,400 mg/m2 = 4,200 mg, Intravenous, 1 Day/Dose, 1 of 6 cycles Administration: 4,200 mg (09/01/2019)  for chemotherapy treatment.      ALLERGIES:  has No Known Allergies.  MEDICATIONS:  Current Outpatient Medications  Medication Sig Dispense Refill  . acetaminophen (TYLENOL) 500 MG tablet Take 500 mg by mouth every 6 (six) hours as needed for mild pain or moderate pain.    Marland Kitchen  aspirin 81 MG tablet Take 81 mg by mouth daily.     Marland Kitchen atorvastatin (LIPITOR) 40 MG tablet Take 1 tablet (40 mg total) by mouth daily. 90 tablet 4  . clopidogrel (PLAVIX) 75 MG tablet Take 1 tablet (75 mg total) by mouth daily. 90 tablet 2  . HYDROcodone-acetaminophen (NORCO/VICODIN) 5-325 MG tablet Take 1 tablet by mouth every 6 (six) hours as needed for moderate pain. 15 tablet 0  . isosorbide mononitrate (IMDUR) 30 MG 24 hr tablet Take 30 mg by mouth every evening.   5  . levothyroxine (SYNTHROID, LEVOTHROID) 50 MCG tablet Take 1 tablet (50 mcg total) by mouth daily. 90 tablet 4  . lidocaine-prilocaine (EMLA) cream Apply to affected area once (Patient taking differently: Apply 1 application topically once. Apply to affected area once) 30 g 3  . lisinopril (ZESTRIL) 10 MG tablet Take 1 tablet (10 mg total) by mouth every morning. 90 tablet 0  . loperamide (IMODIUM A-D) 2 MG tablet Take 2 mg by mouth 4 (four) times daily as needed for diarrhea or loose stools.    . metoprolol succinate (TOPROL-XL) 25 MG 24 hr tablet Take 25 mg by mouth daily.    . multivitamin-iron-minerals-folic acid (CENTRUM) chewable tablet  Chew 1 tablet by mouth daily.    . nitroGLYCERIN (NITROSTAT) 0.4 MG SL tablet Place 1 tablet under the tongue every 5 (five) minutes as needed for chest pain.   98  . ondansetron (ZOFRAN) 8 MG tablet Take 1 tablet (8 mg total) by mouth 2 (two) times daily as needed for refractory nausea / vomiting. Start on day 3 after chemotherapy. 60 tablet 2  . pantoprazole (PROTONIX) 20 MG tablet Take 1 tablet (20 mg total) by mouth daily. 90 tablet 0  . prochlorperazine (COMPAZINE) 10 MG tablet Take 1 tablet (10 mg total) by mouth every 6 (six) hours as needed (Nausea or vomiting). 60 tablet 2   No current facility-administered medications for this visit.    VITAL SIGNS: There were no vitals taken for this visit. There were no vitals filed for this visit.  Estimated body mass index is 20.51 kg/m as  calculated from the following:   Height as of 06/23/19: 5\' 9"  (1.753 m).   Weight as of an earlier encounter on 09/08/19: 138 lb 14.4 oz (63 kg).  LABS: CBC:    Component Value Date/Time   WBC 4.1 09/08/2019 1053   HGB 10.2 (L) 09/08/2019 1053   HGB 13.0 03/15/2019 1028   HCT 31.7 (L) 09/08/2019 1053   HCT 39.2 03/15/2019 1028   PLT 116 (L) 09/08/2019 1053   PLT 130 (L) 03/15/2019 1028   MCV 100.0 09/08/2019 1053   MCV 98 (H) 03/15/2019 1028   NEUTROABS 3.4 09/08/2019 1053   NEUTROABS 2.7 03/15/2019 1028   LYMPHSABS 0.5 (L) 09/08/2019 1053   LYMPHSABS 0.6 (L) 03/15/2019 1028   MONOABS 0.1 09/08/2019 1053   EOSABS 0.1 09/08/2019 1053   EOSABS 0.1 03/15/2019 1028   BASOSABS 0.0 09/08/2019 1053   BASOSABS 0.0 03/15/2019 1028   Comprehensive Metabolic Panel:    Component Value Date/Time   NA 138 09/08/2019 1053   NA 141 03/15/2019 1028   K 3.6 09/08/2019 1053   CL 105 09/08/2019 1053   CO2 26 09/08/2019 1053   BUN 22 09/08/2019 1053   BUN 23 03/15/2019 1028   CREATININE 1.13 09/08/2019 1053   GLUCOSE 102 (H) 09/08/2019 1053   CALCIUM 8.2 (L) 09/08/2019 1053   AST 28 09/08/2019 1053   AST 31 03/15/2019 1022   ALT 26 09/08/2019 1053   ALT 28 03/15/2019 1022   ALKPHOS 59 09/08/2019 1053   BILITOT 0.7 09/08/2019 1053   BILITOT 0.3 08/27/2018 1333   PROT 6.7 09/08/2019 1053   PROT 6.5 08/27/2018 1333   ALBUMIN 3.3 (L) 09/08/2019 1053   ALBUMIN 4.2 08/27/2018 1333    RADIOGRAPHIC STUDIES: DG Chest Port 1 View  Result Date: 08/30/2019 CLINICAL DATA:  Port-A-Cath insertion EXAM: PORTABLE CHEST 1 VIEW COMPARISON:  Radiograph 12/21/2016, CT 06/26/2016 FINDINGS: A right subclavian approach Port-A-Cath tip terminates in the upper SVC. No pneumothorax or effusion. Patchy right perihilar opacity is noted. Some bandlike areas of scarring are noted in the right lung base. The cardiomediastinal contours are unremarkable. Degenerative changes are present in the imaged spine and  shoulders. IMPRESSION: 1. Right subclavian approach Port-A-Cath tip terminates in the upper SVC. No pneumothorax or effusion. 2. Patchy right perihilar opacity may represent atelectasis or infiltrate. 3. Scarring in the right lung base. Electronically Signed   By: Lovena Le M.D.   On: 08/30/2019 16:53   DG C-Arm 1-60 Min-No Report  Result Date: 08/30/2019 Fluoroscopy was utilized by the requesting physician.  No radiographic interpretation.  PERFORMANCE STATUS (ECOG) : 1 - Symptomatic but completely ambulatory  Review of Systems Unless otherwise noted, a complete review of systems is negative.  Physical Exam General: NAD, frail appearing, thin Pulmonary: Unlabored Extremities: no edema, no joint deformities Skin: no rashes Neurological: Weakness but otherwise nonfocal  IMPRESSION: Follow-up visit in clinic today.  Patient reports that he is doing reasonably well.  He did have an episode of epistaxis last night which took several hours to resolve.  Platelets borderline low but patient does not have thrombocytopenia into the point where uncontrolled bleeding would be a concern.  Bleeding occurred after he blew his nose.  Sounds like he continued to blow his nose and we dislodged the clots.  Discussed use of a humidifier due to dry winter air.  Would probably not recommend oxymetazoline in the setting of hypertension and CAD.  We discussed advance care planning.  Patient does have a living will, which I encouraged him to bring to the clinic so that we may scan it into his chart.  Patient took a MOST Form home with him to discuss with his daughter.  Patient says that he would not want to be resuscitated nor have his life prolonged artificially machines.  He verbalized a desire for natural death.  He says that he has had a good life and is ready to go when it is his time.  He does have a strong faith in God.  Case and plan discussed with Dr. [name]  PLAN: -Continue current scope of  treatment -Recommend oral supplements 3 times daily -Referral pending for  RD and ST -MOST form reviewed -Follow up telephone visit in 3-4 weeks   Patient expressed understanding and was in agreement with this plan. He also understands that He can call the clinic at any time with any questions, concerns, or complaints.     Time Total: 20 minutes  Visit consisted of counseling and education dealing with the complex and emotionally intense issues of symptom management and palliative care in the setting of serious and potentially life-threatening illness.Greater than 50%  of this time was spent counseling and coordinating care related to the above assessment and plan.  Signed by: Altha Harm, PhD, NP-C

## 2019-09-13 ENCOUNTER — Inpatient Hospital Stay: Payer: Medicare Other

## 2019-09-13 NOTE — Progress Notes (Signed)
Nutrition Assessment   Reason for Assessment:  Referral from [place], NP   ASSESSMENT:  73 year old male with recurrent adenocarcinoma of esophagus.  Past medical history of  chemo and radiation and esophagectomy in 2018 at [facility] afib, CAD, GERD, HLD, HTN, MI, esophageal dilation.  Patient receiving folfox.    Spoke with patient via phone for nutrition assessment and wife.  Patient reports that he can't tolerate tomatoes or tomato sauce since surgery causes diarrhea.  Reports that he has issues with diarrhea and takes imodium.  Reports that he eats 2 eggs most mornings, ate sausage this am and bread with gravy.  Lunch is meat (honey ham) and green beans and mashed potatoes. Supper is left overs from lunch. Wife prepares all meals.  Patient denies problems swallowing foods at this time.  Reports that he drinks boost (green top) but not every day.  Reports issues with reflux but tries not to eat past 7pm.      Medications: MVI, zofran, compazine, protonix  Labs: glucose 102   Anthropometrics:   Height: 69 inches Weight: 138 lb on 12/23 UBW: 210 lb in 2018 prior to surgery Noted 155 lb in Feb 2020 BMI: 20  11% weight loss in the last 10 months  Estimated Energy Needs  Kcals: 1900-2200 Protein: 95-110 g Fluid: > 1.9 L   NUTRITION DIAGNOSIS: Inadequate oral intake related to altered GI function as evidenced by diarrhea, 11% weight loss in 10 months   INTERVENTION:  Discussed strategies to help with diarrhea.   Encouraged small frequent meals including good sources of protein. Protein foods examples provided. Encouraged oral nutrition supplement at least daily.  Also encouraged snack between breakfast and lunch and lunch and supper.   Contact information provided   MONITORING, EVALUATION, GOAL: Patient will consume adequate calories and protein to prevent weight loss   Next Visit: phone f/u Jan 25th  Dollye Glasser B. Zenia Resides, Lupus, [place] Registered Dietitian [phone]  (pager)

## 2019-09-14 ENCOUNTER — Ambulatory Visit (INDEPENDENT_AMBULATORY_CARE_PROVIDER_SITE_OTHER): Payer: Self-pay | Admitting: Surgery

## 2019-09-14 ENCOUNTER — Encounter: Payer: Self-pay | Admitting: Surgery

## 2019-09-14 ENCOUNTER — Other Ambulatory Visit: Payer: Self-pay

## 2019-09-14 VITALS — BP 127/73 | HR 93 | Temp 98.4°F | Resp 14 | Ht 69.0 in | Wt 138.4 lb

## 2019-09-14 DIAGNOSIS — C155 Malignant neoplasm of lower third of esophagus: Secondary | ICD-10-CM

## 2019-09-14 DIAGNOSIS — Z09 Encounter for follow-up examination after completed treatment for conditions other than malignant neoplasm: Secondary | ICD-10-CM

## 2019-09-14 NOTE — Patient Instructions (Signed)
Follow-up with our office as needed.  Please call if you have any questions or concerns.

## 2019-09-14 NOTE — Progress Notes (Signed)
09/14/2019  HPI: Corey Perry is a 73 y.o. male s/p right subclavian Port-A-Cath placement on 12/14 for recurrent esophageal cancer.  Patient presents today for follow-up.  Reports that he has been doing well and already had 1 round of chemotherapy.  His incision has been doing well has not had any complications with port access or wound infection.  Vital signs: BP 127/73   Pulse 93   Temp 98.4 F (36.9 C) (Temporal)   Resp 14   Ht 5\' 9"  (1.753 m)   Wt 62.8 kg   SpO2 96%   BMI 20.44 kg/m    Physical Exam: Constitutional: No acute distress Skin: Right chest Port-A-Cath incision is healing well with no evidence of breakdown or infection.  Port is well palpable and accessible.  No evidence of any wound infection.  Assessment/Plan: This is a 73 y.o. male s/p right subclavian Port-A-Cath placement.  -Patient is healing well and the port can continue to be used. -Discussed with the patient that he may call us in the future if he comes a point where Port-A-Cath could be removed. -Follow-up as needed.   [name], [facility]

## 2019-09-15 NOTE — Progress Notes (Signed)
[place]  [phone] Fax:(336) [phone]  ID: [name] OB: 30-Sep-1945  MR#: [id number]  NID#:[id number]  Patient Care Team: [name], MD as PCP - General (Family Medicine) [name], MD as PCP - Family Medicine (Family Medicine) [name], MD as Consulting Physician (Oncology) [name], MD as Referring Physician [name], MD as Consulting Physician (Cardiology)  CHIEF COMPLAINT: Recurrent adenocarcinoma of the lower third of the esophagus.  INTERVAL HISTORY: Patient returns to clinic today for further evaluation and consideration of cycle 2 of palliative FOLFOX.  He has had no further episodes of epistaxis.  He tolerated cycle 1 relatively well.  He denies dysphagia, but continues to have difficulty swallowing.  He has no neurologic complaints.  He denies any recent fevers or illnesses.  He denies any chest pain, shortness of breath, cough, or hemoptysis.  He denies any nausea, vomiting, or constipation.  He continues to have occasional diarrhea, but this is well controlled with Imodium.  He has no melena or hematochezia.  He has no urinary complaints.  Patient offers no further specific complaints today.  REVIEW OF SYSTEMS:   Review of Systems  Constitutional: Positive for malaise/fatigue and weight loss. Negative for fever.  HENT: Negative.   Respiratory: Negative.  Negative for cough and shortness of breath.   Cardiovascular: Negative.  Negative for chest pain and leg swelling.  Gastrointestinal: Positive for diarrhea. Negative for abdominal pain, blood in stool, constipation, melena, nausea and vomiting.  Genitourinary: Negative.  Negative for frequency and urgency.  Musculoskeletal: Negative.  Negative for back pain.  Skin: Negative.  Negative for rash.  Neurological: Positive for weakness. Negative for tingling, focal weakness and headaches.  Psychiatric/Behavioral: Negative.  The patient is not  nervous/anxious and does not have insomnia.     As per HPI. Otherwise, a complete review of systems is negative.  PAST MEDICAL HISTORY: Past Medical History:  Diagnosis Date  . Abnormal white blood cell (WBC) count    seeing Dr. [name] at [facility] on 10/25  . Atrial fibrillation ([place])   . BPH (benign prostatic hypertrophy)   . Bradycardia   . CAD (coronary artery disease)   . Dysrhythmia    bradycardia - sees Dr. Humphrey Rolls  . Esophageal cancer ([place]) 05/2016  . GERD (gastroesophageal reflux disease)   . History of kidney stones   . Hyperlipidemia   . Hypertension   . Hypothyroidism   . Kidney stone   . Myocardial infarction ([place]) 2005  . S/P angioplasty with stent 2005   x 4 vessels  . Sleep apnea    mild-NO CPAP  . Spinal stenosis    lumbar    PAST SURGICAL HISTORY: Past Surgical History:  Procedure Laterality Date  . BACK SURGERY    . CARDIAC CATHETERIZATION Left 03/12/2016   Procedure: Left Heart Cath and Coronary Angiography;  Surgeon: [name], MD;  Location: [place];  Service: Cardiovascular;  Laterality: Left;  . CARDIAC CATHETERIZATION N/A 03/12/2016   Procedure: Coronary Stent Intervention;  Surgeon: Yolonda Kida, MD;  Location: [place] [facility];  Service: Cardiovascular;  Laterality: N/A;  . COLONOSCOPY WITH PROPOFOL N/A 07/13/2015   Procedure: COLONOSCOPY WITH PROPOFOL;  Surgeon: Lucilla Lame, MD;  Location: [place];  Service: Endoscopy;  Laterality: N/A;  . CORONARY ANGIOPLASTY  10/05 and 12/05   4 DES placed  . CYSTOSCOPY W/ RETROGRADES Left 06/15/2019   Procedure: CYSTOSCOPY WITH RETROGRADE PYELOGRAM;  Surgeon:  Stoioff, Ronda Fairly, MD;  Location: [facility];  Service: Urology;  Laterality: Left;  . CYSTOSCOPY/URETEROSCOPY/HOLMIUM LASER/STENT PLACEMENT Left 06/15/2019   Procedure: CYSTOSCOPY/URETEROSCOPY/HOLMIUM LASER/STENT PLACEMENT;  Surgeon: Abbie Sons, MD;  Location: [facility];  Service: Urology;  Laterality: Left;  .  ESOPHAGECTOMY  10/07/2016   @ [facility]  . ESOPHAGOGASTRODUODENOSCOPY (EGD) WITH PROPOFOL N/A 06/14/2016   Procedure: ESOPHAGOGASTRODUODENOSCOPY (EGD) WITH PROPOFOL;  Surgeon: [name], MD;  Location: [facility];  Service: Endoscopy;  Laterality: N/A;  . HERNIA REPAIR Bilateral    x3  . INGUINAL HERNIA REPAIR Right 08/28/2017   Large PerFix plug, recurrent hernia;  Surgeon: Robert Bellow, MD;  Location: [facility];  Service: General;  Laterality: Right;  recurrent  . KIDNEY STONE SURGERY  07/15/2019  . POLYPECTOMY  07/13/2015   Procedure: POLYPECTOMY;  Surgeon: Lucilla Lame, MD;  Location: [place];  Service: Endoscopy;;  . PORT-A-CATH REMOVAL N/A 01/16/2017   Procedure: REMOVAL PORT-A-CATH;  Surgeon: Olean Ree, MD;  Location: [facility];  Service: General;  Laterality: N/A;  . PORTACATH PLACEMENT N/A 07/08/2016   Procedure: INSERTION PORT-A-CATH;  Surgeon: Olean Ree, MD;  Location: [facility];  Service: General;  Laterality: N/A;  . PORTACATH PLACEMENT Right 08/30/2019   Procedure: INSERTION PORT-A-CATH;  Surgeon: Olean Ree, MD;  Location: [facility];  Service: General;  Laterality: Right;    FAMILY HISTORY Family History  Problem Relation Age of Onset  . Hypertension Mother   . Dementia Mother   . Heart disease Father   . Hypertension Father   . Kidney disease Father   . Hypertension Brother   . Heart disease Brother   . Diabetes Son   . Hypertension Son   . Hyperlipidemia Son   . Hyperlipidemia Daughter   . Hypertension Daughter        ADVANCED DIRECTIVES:    HEALTH MAINTENANCE: Social History   Tobacco Use  . Smoking status: Former Smoker    Packs/day: 1.00    Years: 40.00    Pack years: 40.00    Types: Cigarettes    Quit date: 07/16/2004    Years since quitting: 15.1  . Smokeless tobacco: Former Systems developer    Types: Chew  Substance Use Topics  . Alcohol use: Yes    Alcohol/week: 1.0 standard drinks    Types: 1 Cans of beer per week  . Drug  use: No     Colonoscopy:  PAP:  Bone density:  Lipid panel:  No Known Allergies  Current Outpatient Medications  Medication Sig Dispense Refill  . acetaminophen (TYLENOL) 500 MG tablet Take 500 mg by mouth every 6 (six) hours as needed for mild pain or moderate pain.    Marland Kitchen aspirin 81 MG tablet Take 81 mg by mouth daily.     Marland Kitchen atorvastatin (LIPITOR) 40 MG tablet Take 1 tablet (40 mg total) by mouth daily. 90 tablet 1  . clopidogrel (PLAVIX) 75 MG tablet Take 1 tablet (75 mg total) by mouth daily. 90 tablet 2  . HYDROcodone-acetaminophen (NORCO/VICODIN) 5-325 MG tablet Take 1 tablet by mouth every 6 (six) hours as needed for moderate pain. 15 tablet 0  . isosorbide mononitrate (IMDUR) 30 MG 24 hr tablet Take 30 mg by mouth every evening.   5  . levothyroxine (SYNTHROID) 50 MCG tablet Take 1 tablet (50 mcg total) by mouth daily. 90 tablet 1  . lidocaine-prilocaine (EMLA) cream Apply to affected area once (Patient taking differently: Apply 1 application topically once. Apply to affected area  once) 30 g 3  . lisinopril (ZESTRIL) 10 MG tablet Take 1 tablet (10 mg total) by mouth every morning. 90 tablet 0  . loperamide (IMODIUM A-D) 2 MG tablet Take 2 mg by mouth 4 (four) times daily as needed for diarrhea or loose stools.    . metoprolol succinate (TOPROL-XL) 25 MG 24 hr tablet Take 25 mg by mouth daily.    . multivitamin-iron-minerals-folic acid (CENTRUM) chewable tablet Chew 1 tablet by mouth daily.    . nitroGLYCERIN (NITROSTAT) 0.4 MG SL tablet Place 1 tablet under the tongue every 5 (five) minutes as needed for chest pain.   98  . ondansetron (ZOFRAN) 8 MG tablet Take 1 tablet (8 mg total) by mouth 2 (two) times daily as needed for refractory nausea / vomiting. Start on day 3 after chemotherapy. 60 tablet 2  . pantoprazole (PROTONIX) 20 MG tablet Take 1 tablet (20 mg total) by mouth daily. 90 tablet 0  . prochlorperazine (COMPAZINE) 10 MG tablet Take 1 tablet (10 mg total) by mouth every 6  (six) hours as needed (Nausea or vomiting). 60 tablet 2   No current facility-administered medications for this visit.   Facility-Administered Medications Ordered in Other Visits  Medication Dose Route Frequency Provider Last Rate Last Admin  . fluorouracil (ADRUCIL) 4,200 mg in sodium chloride 0.9 % 66 mL chemo infusion  2,400 mg/m2 (Treatment Plan Recorded) Intravenous 1 day or 1 dose [name], MD   4,200 mg at 09/22/19 1303    OBJECTIVE: Vitals:   09/22/19 0836  BP: 111/73  Pulse: 81  Resp: 17  SpO2: 97%     Body mass index is 20.36 kg/m.    ECOG FS:0 - Asymptomatic  General: Thin, no acute distress. Eyes: Pink conjunctiva, anicteric sclera. HEENT: Normocephalic, moist mucous membranes. Lungs: No audible wheezing or coughing. Heart: Regular rate and rhythm. Abdomen: Soft, nontender, no obvious distention. Musculoskeletal: No edema, cyanosis, or clubbing. Neuro: Alert, answering all questions appropriately. Cranial nerves grossly intact. Skin: No rashes or petechiae noted. Psych: Normal affect.   LAB RESULTS:  Lab Results  Component Value Date   NA 139 09/22/2019   K 3.1 (L) 09/22/2019   CL 104 09/22/2019   CO2 27 09/22/2019   GLUCOSE 107 (H) 09/22/2019   BUN 13 09/22/2019   CREATININE 1.20 09/22/2019   CALCIUM 8.3 (L) 09/22/2019   PROT 6.9 09/22/2019   ALBUMIN 3.0 (L) 09/22/2019   AST 32 09/22/2019   ALT 32 09/22/2019   ALKPHOS 59 09/22/2019   BILITOT 0.5 09/22/2019   GFRNONAA 60 (L) 09/22/2019   GFRAA >60 09/22/2019    Lab Results  Component Value Date   WBC 4.7 09/22/2019   NEUTROABS 3.4 09/22/2019   HGB 9.6 (L) 09/22/2019   HCT 30.8 (L) 09/22/2019   MCV 100.0 09/22/2019   PLT 223 09/22/2019     STUDIES: DG Chest Port 1 View  Result Date: 08/30/2019 CLINICAL DATA:  Port-A-Cath insertion EXAM: PORTABLE CHEST 1 VIEW COMPARISON:  Radiograph 12/21/2016, CT 06/26/2016 FINDINGS: A right subclavian approach Port-A-Cath tip terminates in the  upper SVC. No pneumothorax or effusion. Patchy right perihilar opacity is noted. Some bandlike areas of scarring are noted in the right lung base. The cardiomediastinal contours are unremarkable. Degenerative changes are present in the imaged spine and shoulders. IMPRESSION: 1. Right subclavian approach Port-A-Cath tip terminates in the upper SVC. No pneumothorax or effusion. 2. Patchy right perihilar opacity may represent atelectasis or infiltrate. 3. Scarring in the right  lung base. Electronically Signed   By: Lovena Le M.D.   On: 08/30/2019 16:53   DG C-Arm 1-60 Min-No Report  Result Date: 08/30/2019 Fluoroscopy was utilized by the requesting physician.  No radiographic interpretation.    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  PLAN:    1.  Recurrent adenocarcinoma of the lower third of the esophagus: Patient initially completed 6 cycles of weekly carboplatinum and Taxol on August 21, 2016 and daily XRT completed on August 27, 2016. Patient had his esophagectomy on October 07, 2016.  His most recent imaging at [facility] suggested recurrence which was confirmed with biopsy.  Per recommendation of [facility], will proceed with palliative FOLFOX.  Patient now has had port placement. Awaiting final pathology results to assess whether nivolumab will be of benefit.  Proceed with cycle 2 of FOLFOX today.  Return to clinic in 2 days for pump removal and then in 2 weeks for further evaluation and consideration of cycle 3.     2.  Weight loss: Appreciate dietary and palliative care input. 3.  Thrombocytopenia: Resolved. 4. Renal insufficiency: Resolved. 5.  Diarrhea: Chronic and unchanged.  Continue OTC Imodium. 6.  Anemia: Patient hemoglobin is slowly trending down and is now 9.6.  Monitor.   Patient expressed understanding and was in agreement with this plan. He also understands that He can call clinic at any time with any questions, concerns, or  complaints.    [name], MD 09/22/19 2:34 PM

## 2019-09-16 ENCOUNTER — Other Ambulatory Visit: Payer: Self-pay | Admitting: Family Medicine

## 2019-09-16 DIAGNOSIS — E039 Hypothyroidism, unspecified: Secondary | ICD-10-CM

## 2019-09-16 DIAGNOSIS — E785 Hyperlipidemia, unspecified: Secondary | ICD-10-CM

## 2019-09-16 MED ORDER — ATORVASTATIN CALCIUM 40 MG PO TABS: 40.0000 mg | ORAL_TABLET | Freq: Every day | ORAL | 1 refills | Status: DC

## 2019-09-16 MED ORDER — LEVOTHYROXINE SODIUM 50 MCG PO TABS: 50.0000 ug | ORAL_TABLET | Freq: Every day | ORAL | 1 refills | Status: DC

## 2019-09-16 NOTE — Telephone Encounter (Signed)
Medication Refill - Medication: levothyroxine (SYNTHROID, LEVOTHROID) 50 MCG tablet  atorvastatin (LIPITOR) 40 MG tablet  Has the patient contacted their pharmacy? Yes.   (Agent: If no, request that the patient contact the pharmacy for the refill.) (Agent: If yes, when and what did the pharmacy advise?)  Preferred Pharmacy (with phone number or street name):  [facility] [place] [facility] [place]  [place] [address]  Phone: [phone] Fax: [phone]     Agent: Please be advised that RX refills may take up to 3 business days. We ask that you follow-up with your pharmacy.

## 2019-09-22 ENCOUNTER — Encounter: Payer: Self-pay | Admitting: Oncology

## 2019-09-22 ENCOUNTER — Other Ambulatory Visit: Payer: Self-pay

## 2019-09-22 ENCOUNTER — Inpatient Hospital Stay: Payer: Medicare Other | Attending: Oncology

## 2019-09-22 ENCOUNTER — Inpatient Hospital Stay (HOSPITAL_BASED_OUTPATIENT_CLINIC_OR_DEPARTMENT_OTHER): Payer: Medicare Other | Admitting: Oncology

## 2019-09-22 ENCOUNTER — Inpatient Hospital Stay: Payer: Medicare Other

## 2019-09-22 VITALS — BP 111/73 | HR 81 | Resp 17 | Wt 137.9 lb

## 2019-09-22 DIAGNOSIS — R197 Diarrhea, unspecified: Secondary | ICD-10-CM | POA: Diagnosis not present

## 2019-09-22 DIAGNOSIS — Z5111 Encounter for antineoplastic chemotherapy: Secondary | ICD-10-CM | POA: Insufficient documentation

## 2019-09-22 DIAGNOSIS — Z87891 Personal history of nicotine dependence: Secondary | ICD-10-CM | POA: Insufficient documentation

## 2019-09-22 DIAGNOSIS — I10 Essential (primary) hypertension: Secondary | ICD-10-CM | POA: Diagnosis not present

## 2019-09-22 DIAGNOSIS — D696 Thrombocytopenia, unspecified: Secondary | ICD-10-CM | POA: Insufficient documentation

## 2019-09-22 DIAGNOSIS — Z83438 Family history of other disorder of lipoprotein metabolism and other lipidemia: Secondary | ICD-10-CM | POA: Diagnosis not present

## 2019-09-22 DIAGNOSIS — E785 Hyperlipidemia, unspecified: Secondary | ICD-10-CM | POA: Insufficient documentation

## 2019-09-22 DIAGNOSIS — Z8249 Family history of ischemic heart disease and other diseases of the circulatory system: Secondary | ICD-10-CM | POA: Insufficient documentation

## 2019-09-22 DIAGNOSIS — E039 Hypothyroidism, unspecified: Secondary | ICD-10-CM | POA: Diagnosis not present

## 2019-09-22 DIAGNOSIS — Z833 Family history of diabetes mellitus: Secondary | ICD-10-CM | POA: Insufficient documentation

## 2019-09-22 DIAGNOSIS — D709 Neutropenia, unspecified: Secondary | ICD-10-CM | POA: Diagnosis not present

## 2019-09-22 DIAGNOSIS — I4891 Unspecified atrial fibrillation: Secondary | ICD-10-CM | POA: Diagnosis not present

## 2019-09-22 DIAGNOSIS — C155 Malignant neoplasm of lower third of esophagus: Secondary | ICD-10-CM | POA: Insufficient documentation

## 2019-09-22 DIAGNOSIS — I2510 Atherosclerotic heart disease of native coronary artery without angina pectoris: Secondary | ICD-10-CM | POA: Diagnosis not present

## 2019-09-22 DIAGNOSIS — D649 Anemia, unspecified: Secondary | ICD-10-CM | POA: Insufficient documentation

## 2019-09-22 DIAGNOSIS — Z818 Family history of other mental and behavioral disorders: Secondary | ICD-10-CM | POA: Diagnosis not present

## 2019-09-22 DIAGNOSIS — I252 Old myocardial infarction: Secondary | ICD-10-CM | POA: Diagnosis not present

## 2019-09-22 DIAGNOSIS — Z79899 Other long term (current) drug therapy: Secondary | ICD-10-CM | POA: Diagnosis not present

## 2019-09-22 DIAGNOSIS — Z841 Family history of disorders of kidney and ureter: Secondary | ICD-10-CM | POA: Diagnosis not present

## 2019-09-22 DIAGNOSIS — R531 Weakness: Secondary | ICD-10-CM | POA: Insufficient documentation

## 2019-09-22 DIAGNOSIS — R5383 Other fatigue: Secondary | ICD-10-CM | POA: Insufficient documentation

## 2019-09-22 LAB — CBC WITH DIFFERENTIAL/PLATELET
Abs Immature Granulocytes: 0.03 10*3/uL (ref 0.00–0.07)
Basophils Absolute: 0 10*3/uL (ref 0.0–0.1)
Basophils Relative: 0 %
Eosinophils Absolute: 0.1 10*3/uL (ref 0.0–0.5)
Eosinophils Relative: 1 %
HCT: 30.8 % — ABNORMAL LOW (ref 39.0–52.0)
Hemoglobin: 9.6 g/dL — ABNORMAL LOW (ref 13.0–17.0)
Immature Granulocytes: 1 %
Lymphocytes Relative: 12 %
Lymphs Abs: 0.6 10*3/uL — ABNORMAL LOW (ref 0.7–4.0)
MCH: 31.2 pg (ref 26.0–34.0)
MCHC: 31.2 g/dL (ref 30.0–36.0)
MCV: 100 fL (ref 80.0–100.0)
Monocytes Absolute: 0.7 10*3/uL (ref 0.1–1.0)
Monocytes Relative: 14 %
Neutro Abs: 3.4 10*3/uL (ref 1.7–7.7)
Neutrophils Relative %: 72 %
Platelets: 223 10*3/uL (ref 150–400)
RBC: 3.08 MIL/uL — ABNORMAL LOW (ref 4.22–5.81)
RDW: 14.1 % (ref 11.5–15.5)
WBC: 4.7 10*3/uL (ref 4.0–10.5)
nRBC: 0 % (ref 0.0–0.2)

## 2019-09-22 LAB — COMPREHENSIVE METABOLIC PANEL
ALT: 32 U/L (ref 0–44)
AST: 32 U/L (ref 15–41)
Albumin: 3 g/dL — ABNORMAL LOW (ref 3.5–5.0)
Alkaline Phosphatase: 59 U/L (ref 38–126)
Anion gap: 8 (ref 5–15)
BUN: 13 mg/dL (ref 8–23)
CO2: 27 mmol/L (ref 22–32)
Calcium: 8.3 mg/dL — ABNORMAL LOW (ref 8.9–10.3)
Chloride: 104 mmol/L (ref 98–111)
Creatinine, Ser: 1.2 mg/dL (ref 0.61–1.24)
GFR calc Af Amer: >60 mL/min (ref >60)
GFR calc non Af Amer: 60 mL/min — ABNORMAL LOW (ref >60)
Glucose, Bld: 107 mg/dL — ABNORMAL HIGH (ref 70–99)
Potassium: 3.1 mmol/L — ABNORMAL LOW (ref 3.5–5.1)
Sodium: 139 mmol/L (ref 135–145)
Total Bilirubin: 0.5 mg/dL (ref 0.3–1.2)
Total Protein: 6.9 g/dL (ref 6.5–8.1)

## 2019-09-22 MED ORDER — LEUCOVORIN CALCIUM INJECTION 350 MG: 400.0000 mg/m2 | Freq: Once | INTRAVENOUS | Status: DC

## 2019-09-22 MED ORDER — SODIUM CHLORIDE 0.9 % IV SOLN
10.0000 mg | Freq: Once | INTRAVENOUS | Status: AC
  Administered 2019-09-22: 10 mg via INTRAVENOUS
  Filled 2019-09-22: qty 1

## 2019-09-22 MED ORDER — OXALIPLATIN CHEMO INJECTION 100 MG/20ML
85.0000 mg/m2 | Freq: Once | INTRAVENOUS | Status: AC
  Administered 2019-09-22: 150 mg via INTRAVENOUS
  Filled 2019-09-22: qty 20

## 2019-09-22 MED ORDER — PALONOSETRON HCL INJECTION 0.25 MG/5ML
0.2500 mg | Freq: Once | INTRAVENOUS | Status: AC
  Administered 2019-09-22: 0.25 mg via INTRAVENOUS
  Filled 2019-09-22: qty 5

## 2019-09-22 MED ORDER — FLUOROURACIL CHEMO INJECTION 2.5 GM/50ML
400.0000 mg/m2 | Freq: Once | INTRAVENOUS | Status: AC
  Administered 2019-09-22: 700 mg via INTRAVENOUS
  Filled 2019-09-22: qty 14

## 2019-09-22 MED ORDER — LEUCOVORIN CALCIUM INJECTION 350 MG
700.0000 mg | Freq: Once | INTRAVENOUS | Status: AC
  Administered 2019-09-22: 700 mg via INTRAVENOUS
  Filled 2019-09-22: qty 25

## 2019-09-22 MED ORDER — DEXTROSE 5 % IV SOLN
Freq: Once | INTRAVENOUS | Status: AC
  Filled 2019-09-22: qty 250

## 2019-09-22 MED ORDER — SODIUM CHLORIDE 0.9 % IV SOLN
2400.0000 mg/m2 | INTRAVENOUS | Status: DC
  Administered 2019-09-22: 4200 mg via INTRAVENOUS
  Filled 2019-09-22: qty 84

## 2019-09-24 ENCOUNTER — Other Ambulatory Visit: Payer: Self-pay

## 2019-09-24 ENCOUNTER — Inpatient Hospital Stay: Payer: Medicare Other

## 2019-09-24 VITALS — BP 123/70 | HR 78 | Temp 97.6°F | Resp 18

## 2019-09-24 DIAGNOSIS — D696 Thrombocytopenia, unspecified: Secondary | ICD-10-CM | POA: Diagnosis not present

## 2019-09-24 DIAGNOSIS — C155 Malignant neoplasm of lower third of esophagus: Secondary | ICD-10-CM | POA: Diagnosis not present

## 2019-09-24 DIAGNOSIS — D649 Anemia, unspecified: Secondary | ICD-10-CM | POA: Diagnosis not present

## 2019-09-24 DIAGNOSIS — Z5111 Encounter for antineoplastic chemotherapy: Secondary | ICD-10-CM | POA: Diagnosis not present

## 2019-09-24 DIAGNOSIS — R197 Diarrhea, unspecified: Secondary | ICD-10-CM | POA: Diagnosis not present

## 2019-09-24 DIAGNOSIS — D709 Neutropenia, unspecified: Secondary | ICD-10-CM | POA: Diagnosis not present

## 2019-09-24 MED ORDER — HEPARIN SOD (PORK) LOCK FLUSH 100 UNIT/ML IV SOLN
500.0000 [IU] | Freq: Once | INTRAVENOUS | Status: AC | PRN
  Administered 2019-09-24: 500 [IU]
  Filled 2019-09-24: qty 5

## 2019-09-27 ENCOUNTER — Other Ambulatory Visit: Payer: Self-pay | Admitting: Family Medicine

## 2019-09-27 NOTE — Telephone Encounter (Signed)
Routing to provider  

## 2019-09-27 NOTE — Telephone Encounter (Signed)
Pt request refill  isosorbide mononitrate (IMDUR) 30 MG 24 hr tablet (his cardiologist used to prescribe, but he no longer sees this dr.  [name] said Dr Jeananne Rama would take over)  clopidogrel (PLAVIX) 75 MG tablet  [name], [name] - [place] Phone:  [phone]  Fax:  [phone]      Pt has appt on 10/25/19 with Dr Wynetta Emery

## 2019-09-28 MED ORDER — ISOSORBIDE MONONITRATE ER 30 MG PO TB24: 30.0000 mg | ORAL_TABLET | Freq: Every evening | ORAL | 1 refills | Status: DC

## 2019-09-29 ENCOUNTER — Inpatient Hospital Stay (HOSPITAL_BASED_OUTPATIENT_CLINIC_OR_DEPARTMENT_OTHER): Payer: Medicare Other | Admitting: Hospice and Palliative Medicine

## 2019-09-29 ENCOUNTER — Other Ambulatory Visit: Payer: Self-pay

## 2019-09-29 DIAGNOSIS — Z515 Encounter for palliative care: Secondary | ICD-10-CM

## 2019-09-29 NOTE — Progress Notes (Signed)
Attempted to call patient at time of scheduled telephone visit but was unable to reach him.  Will reschedule.

## 2019-10-01 NOTE — Progress Notes (Signed)
[place]  Telephone:(336) [phone] Fax:(336) [phone]  ID: Corey Perry OB: 03-03-1946  MR#: [id number]  AOZ#:[id number]  Patient Care Team: [name], MD as PCP - General (Family Medicine) [name], MD as PCP - Family Medicine (Family Medicine) [name], MD as Consulting Physician (Oncology) [name], MD as Referring Physician [name], MD as Consulting Physician (Cardiology)  CHIEF COMPLAINT: Recurrent adenocarcinoma of the lower third of the esophagus.  INTERVAL HISTORY: Patient returns to clinic today for further evaluation and consideration of cycle 3 of palliative FOLFOX.  He continues to have chronic weakness and fatigue, but otherwise feels well.  He does not complain of dysphagia or difficulty swallowing today.  He has no neurologic complaints.  He denies any recent fevers or illnesses.  He denies any chest pain, shortness of breath, cough, or hemoptysis.  He denies any nausea, vomiting, or constipation.  He continues to have occasional diarrhea, but this is well controlled with Imodium.  He has no melena or hematochezia.  He has no urinary complaints.  Patient offers no further specific complaints today.  REVIEW OF SYSTEMS:   Review of Systems  Constitutional: Positive for malaise/fatigue and weight loss. Negative for fever.  HENT: Negative.   Respiratory: Negative.  Negative for cough and shortness of breath.   Cardiovascular: Negative.  Negative for chest pain and leg swelling.  Gastrointestinal: Positive for diarrhea. Negative for abdominal pain, blood in stool, constipation, melena, nausea and vomiting.  Genitourinary: Negative.  Negative for frequency and urgency.  Musculoskeletal: Negative.  Negative for back pain.  Skin: Negative.  Negative for rash.  Neurological: Positive for weakness. Negative for tingling, focal weakness and headaches.  Psychiatric/Behavioral: Negative.  The patient is not  nervous/anxious and does not have insomnia.     As per HPI. Otherwise, a complete review of systems is negative.  PAST MEDICAL HISTORY: Past Medical History:  Diagnosis Date  . Abnormal white blood cell (WBC) count    seeing Dr. [name] at [place] on 10/25  . Atrial fibrillation ([place])   . BPH (benign prostatic hypertrophy)   . Bradycardia   . CAD (coronary artery disease)   . Dysrhythmia    bradycardia - sees Dr. Humphrey Rolls  . Esophageal cancer ([place]) 05/2016  . GERD (gastroesophageal reflux disease)   . History of kidney stones   . Hyperlipidemia   . Hypertension   . Hypothyroidism   . Kidney stone   . Myocardial infarction ([place]) 2005  . S/P angioplasty with stent 2005   x 4 vessels  . Sleep apnea    mild-NO CPAP  . Spinal stenosis    lumbar    PAST SURGICAL HISTORY: Past Surgical History:  Procedure Laterality Date  . BACK SURGERY    . CARDIAC CATHETERIZATION Left 03/12/2016   Procedure: Left Heart Cath and Coronary Angiography;  Surgeon: [name], MD;  Location: [facility] [facility];  Service: Cardiovascular;  Laterality: Left;  . CARDIAC CATHETERIZATION N/A 03/12/2016   Procedure: Coronary Stent Intervention;  Surgeon: Yolonda Kida, MD;  Location: [place] [facility];  Service: Cardiovascular;  Laterality: N/A;  . COLONOSCOPY WITH PROPOFOL N/A 07/13/2015   Procedure: COLONOSCOPY WITH PROPOFOL;  Surgeon: Lucilla Lame, MD;  Location: [facility];  Service: Endoscopy;  Laterality: N/A;  . CORONARY ANGIOPLASTY  10/05 and 12/05   4 DES placed  . CYSTOSCOPY W/ RETROGRADES Left 06/15/2019   Procedure: CYSTOSCOPY WITH RETROGRADE PYELOGRAM;  Surgeon: Ceferino Giovanni  C, MD;  Location: [facility];  Service: Urology;  Laterality: Left;  . CYSTOSCOPY/URETEROSCOPY/HOLMIUM LASER/STENT PLACEMENT Left 06/15/2019   Procedure: CYSTOSCOPY/URETEROSCOPY/HOLMIUM LASER/STENT PLACEMENT;  Surgeon: Abbie Sons, MD;  Location: [facility];  Service: Urology;  Laterality: Left;  .  ESOPHAGECTOMY  10/07/2016   @ [facility]  . ESOPHAGOGASTRODUODENOSCOPY (EGD) WITH PROPOFOL N/A 06/14/2016   Procedure: ESOPHAGOGASTRODUODENOSCOPY (EGD) WITH PROPOFOL;  Surgeon: [name], MD;  Location: [facility] END[facility]COPY;  Service: Endoscopy;  Laterality: N/A;  . HERNIA REPAIR Bilateral    x3  . INGUINAL HERNIA REPAIR Right 08/28/2017   Large PerFix plug, recurrent hernia;  Surgeon: Robert Bellow, MD;  Location: [facility];  Service: General;  Laterality: Right;  recurrent  . KIDNEY STONE SURGERY  07/15/2019  . POLYPECTOMY  07/13/2015   Procedure: POLYPECTOMY;  Surgeon: Lucilla Lame, MD;  Location: [place];  Service: Endoscopy;;  . PORT-A-CATH REMOVAL N/A 01/16/2017   Procedure: REMOVAL PORT-A-CATH;  Surgeon: Olean Ree, MD;  Location: [facility];  Service: General;  Laterality: N/A;  . PORTACATH PLACEMENT N/A 07/08/2016   Procedure: INSERTION PORT-A-CATH;  Surgeon: Olean Ree, MD;  Location: [facility];  Service: General;  Laterality: N/A;  . PORTACATH PLACEMENT Right 08/30/2019   Procedure: INSERTION PORT-A-CATH;  Surgeon: Olean Ree, MD;  Location: [facility];  Service: General;  Laterality: Right;    FAMILY HISTORY Family History  Problem Relation Age of Onset  . Hypertension Mother   . Dementia Mother   . Heart disease Father   . Hypertension Father   . Kidney disease Father   . Hypertension Brother   . Heart disease Brother   . Diabetes Son   . Hypertension Son   . Hyperlipidemia Son   . Hyperlipidemia Daughter   . Hypertension Daughter        ADVANCED DIRECTIVES:    HEALTH MAINTENANCE: Social History   Tobacco Use  . Smoking status: Former Smoker    Packs/day: 1.00    Years: 40.00    Pack years: 40.00    Types: Cigarettes    Quit date: 07/16/2004    Years since quitting: 15.2  . Smokeless tobacco: Former Systems developer    Types: Chew  Substance Use Topics  . Alcohol use: Yes    Alcohol/week: 1.0 standard drinks    Types: 1 Cans of beer per week  . Drug  use: No     Colonoscopy:  PAP:  Bone density:  Lipid panel:  No Known Allergies  Current Outpatient Medications  Medication Sig Dispense Refill  . acetaminophen (TYLENOL) 500 MG tablet Take 500 mg by mouth every 6 (six) hours as needed for mild pain or moderate pain.    Marland Kitchen aspirin 81 MG tablet Take 81 mg by mouth daily.     Marland Kitchen atorvastatin (LIPITOR) 40 MG tablet Take 1 tablet (40 mg total) by mouth daily. 90 tablet 1  . clopidogrel (PLAVIX) 75 MG tablet Take 1 tablet (75 mg total) by mouth daily. 90 tablet 2  . HYDROcodone-acetaminophen (NORCO/VICODIN) 5-325 MG tablet Take 1 tablet by mouth every 6 (six) hours as needed for moderate pain. 15 tablet 0  . isosorbide mononitrate (IMDUR) 30 MG 24 hr tablet Take 1 tablet (30 mg total) by mouth every evening. 30 tablet 1  . levothyroxine (SYNTHROID) 50 MCG tablet Take 1 tablet (50 mcg total) by mouth daily. 90 tablet 1  . lidocaine-prilocaine (EMLA) cream Apply to affected area once (Patient taking differently: Apply 1 application topically once. Apply to affected  area once) 30 g 3  . lisinopril (ZESTRIL) 10 MG tablet Take 1 tablet (10 mg total) by mouth every morning. 90 tablet 0  . loperamide (IMODIUM A-D) 2 MG tablet Take 2 mg by mouth 4 (four) times daily as needed for diarrhea or loose stools.    . metoprolol succinate (TOPROL-XL) 25 MG 24 hr tablet Take 25 mg by mouth daily.    . multivitamin-iron-minerals-folic acid (CENTRUM) chewable tablet Chew 1 tablet by mouth daily.    . nitroGLYCERIN (NITROSTAT) 0.4 MG SL tablet Place 1 tablet under the tongue every 5 (five) minutes as needed for chest pain.   98  . ondansetron (ZOFRAN) 8 MG tablet Take 1 tablet (8 mg total) by mouth 2 (two) times daily as needed for refractory nausea / vomiting. Start on day 3 after chemotherapy. 60 tablet 2  . pantoprazole (PROTONIX) 20 MG tablet Take 1 tablet (20 mg total) by mouth daily. 90 tablet 0  . prochlorperazine (COMPAZINE) 10 MG tablet Take 1 tablet (10 mg  total) by mouth every 6 (six) hours as needed (Nausea or vomiting). 60 tablet 2   No current facility-administered medications for this visit.   Facility-Administered Medications Ordered in Other Visits  Medication Dose Route Frequency Provider Last Rate Last Admin  . fluorouracil (ADRUCIL) 4,200 mg in sodium chloride 0.9 % 66 mL chemo infusion  2,400 mg/m2 (Treatment Plan Recorded) Intravenous 1 day or 1 dose [name], MD   4,200 mg at 10/06/19 1253    OBJECTIVE: Vitals:   10/06/19 0915  BP: 108/75  Pulse: 70  Resp: 17  SpO2: 100%     Body mass index is 20.32 kg/m.    ECOG FS:0 - Asymptomatic  General: Well-developed, well-nourished, no acute distress. Eyes: Pink conjunctiva, anicteric sclera. HEENT: Normocephalic, moist mucous membranes. Lungs: No audible wheezing or coughing. Heart: Regular rate and rhythm. Abdomen: Soft, nontender, no obvious distention. Musculoskeletal: No edema, cyanosis, or clubbing. Neuro: Alert, answering all questions appropriately. Cranial nerves grossly intact. Skin: No rashes or petechiae noted. Psych: Normal affect.   LAB RESULTS:  Lab Results  Component Value Date   NA 141 10/06/2019   K 3.4 (L) 10/06/2019   CL 108 10/06/2019   CO2 27 10/06/2019   GLUCOSE 88 10/06/2019   BUN 21 10/06/2019   CREATININE 1.10 10/06/2019   CALCIUM 8.4 (L) 10/06/2019   PROT 6.7 10/06/2019   ALBUMIN 3.2 (L) 10/06/2019   AST 25 10/06/2019   ALT 23 10/06/2019   ALKPHOS 60 10/06/2019   BILITOT 0.5 10/06/2019   GFRNONAA >60 10/06/2019   GFRAA >60 10/06/2019    Lab Results  Component Value Date   WBC 2.6 (L) 10/06/2019   NEUTROABS 1.7 10/06/2019   HGB 8.7 (L) 10/06/2019   HCT 28.1 (L) 10/06/2019   MCV 100.4 (H) 10/06/2019   PLT 116 (L) 10/06/2019     STUDIES: No results found.  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  PLAN:    1.  Recurrent adenocarcinoma of the lower third of the esophagus:  Patient initially completed 6 cycles of weekly carboplatinum and Taxol on August 21, 2016 and daily XRT completed on August 27, 2016. Patient had his esophagectomy on October 07, 2016.  His most recent imaging at [facility] suggested recurrence which was confirmed with biopsy.  Per recommendation of [facility], will proceed with palliative FOLFOX.  Patient now has had port placement. Awaiting final pathology results to assess whether nivolumab will  be of benefit.  Proceed with cycle 3 of FOLFOX today.  Given patient's neutropenia, will add Udenyca with the remainders of the cycle.  Return to clinic in 2 days for pump removal and Udenyca and then in 2 weeks for further evaluation and consideration of cycle 4.   3.  Thrombocytopenia: Mild, monitor. Proceed with treatment as above. 4.  Renal insufficiency: Resolved. 5.  Diarrhea: Chronic and unchanged.  Continue OTC Imodium. 6.  Anemia: Patient hemoglobin is slowly trending down and is now 8.7.  Monitor. 7.  Neutropenia: Add Udenyca as above.   Patient expressed understanding and was in agreement with this plan. He also understands that He can call clinic at any time with any questions, concerns, or complaints.    [name], MD 10/06/19 1:59 PM

## 2019-10-05 ENCOUNTER — Encounter: Payer: Self-pay | Admitting: Oncology

## 2019-10-05 NOTE — Progress Notes (Signed)
Patient prescreened for appointment. Patient has no concerns or questions.  

## 2019-10-06 ENCOUNTER — Other Ambulatory Visit: Payer: Self-pay

## 2019-10-06 ENCOUNTER — Inpatient Hospital Stay: Payer: Medicare Other

## 2019-10-06 ENCOUNTER — Inpatient Hospital Stay (HOSPITAL_BASED_OUTPATIENT_CLINIC_OR_DEPARTMENT_OTHER): Payer: Medicare Other | Admitting: Oncology

## 2019-10-06 VITALS — BP 108/75 | HR 70 | Resp 17 | Wt 137.6 lb

## 2019-10-06 DIAGNOSIS — D696 Thrombocytopenia, unspecified: Secondary | ICD-10-CM | POA: Diagnosis not present

## 2019-10-06 DIAGNOSIS — D709 Neutropenia, unspecified: Secondary | ICD-10-CM | POA: Diagnosis not present

## 2019-10-06 DIAGNOSIS — Z5111 Encounter for antineoplastic chemotherapy: Secondary | ICD-10-CM | POA: Diagnosis not present

## 2019-10-06 DIAGNOSIS — D649 Anemia, unspecified: Secondary | ICD-10-CM | POA: Diagnosis not present

## 2019-10-06 DIAGNOSIS — C155 Malignant neoplasm of lower third of esophagus: Secondary | ICD-10-CM

## 2019-10-06 DIAGNOSIS — Z95828 Presence of other vascular implants and grafts: Secondary | ICD-10-CM

## 2019-10-06 DIAGNOSIS — R197 Diarrhea, unspecified: Secondary | ICD-10-CM | POA: Diagnosis not present

## 2019-10-06 LAB — CBC WITH DIFFERENTIAL/PLATELET
Abs Immature Granulocytes: 0.01 10*3/uL (ref 0.00–0.07)
Basophils Absolute: 0 10*3/uL (ref 0.0–0.1)
Basophils Relative: 1 %
Eosinophils Absolute: 0.1 10*3/uL (ref 0.0–0.5)
Eosinophils Relative: 3 %
HCT: 28.1 % — ABNORMAL LOW (ref 39.0–52.0)
Hemoglobin: 8.7 g/dL — ABNORMAL LOW (ref 13.0–17.0)
Immature Granulocytes: 0 %
Lymphocytes Relative: 17 %
Lymphs Abs: 0.4 10*3/uL — ABNORMAL LOW (ref 0.7–4.0)
MCH: 31.1 pg (ref 26.0–34.0)
MCHC: 31 g/dL (ref 30.0–36.0)
MCV: 100.4 fL — ABNORMAL HIGH (ref 80.0–100.0)
Monocytes Absolute: 0.4 10*3/uL (ref 0.1–1.0)
Monocytes Relative: 15 %
Neutro Abs: 1.7 10*3/uL (ref 1.7–7.7)
Neutrophils Relative %: 64 %
Platelets: 116 10*3/uL — ABNORMAL LOW (ref 150–400)
RBC: 2.8 MIL/uL — ABNORMAL LOW (ref 4.22–5.81)
RDW: 15.1 % (ref 11.5–15.5)
WBC: 2.6 10*3/uL — ABNORMAL LOW (ref 4.0–10.5)
nRBC: 0 % (ref 0.0–0.2)

## 2019-10-06 LAB — COMPREHENSIVE METABOLIC PANEL
ALT: 23 U/L (ref 0–44)
AST: 25 U/L (ref 15–41)
Albumin: 3.2 g/dL — ABNORMAL LOW (ref 3.5–5.0)
Alkaline Phosphatase: 60 U/L (ref 38–126)
Anion gap: 6 (ref 5–15)
BUN: 21 mg/dL (ref 8–23)
CO2: 27 mmol/L (ref 22–32)
Calcium: 8.4 mg/dL — ABNORMAL LOW (ref 8.9–10.3)
Chloride: 108 mmol/L (ref 98–111)
Creatinine, Ser: 1.1 mg/dL (ref 0.61–1.24)
GFR calc Af Amer: >60 mL/min (ref >60)
GFR calc non Af Amer: >60 mL/min (ref >60)
Glucose, Bld: 88 mg/dL (ref 70–99)
Potassium: 3.4 mmol/L — ABNORMAL LOW (ref 3.5–5.1)
Sodium: 141 mmol/L (ref 135–145)
Total Bilirubin: 0.5 mg/dL (ref 0.3–1.2)
Total Protein: 6.7 g/dL (ref 6.5–8.1)

## 2019-10-06 MED ORDER — PALONOSETRON HCL INJECTION 0.25 MG/5ML
0.2500 mg | Freq: Once | INTRAVENOUS | Status: AC
  Administered 2019-10-06: 0.25 mg via INTRAVENOUS
  Filled 2019-10-06: qty 5

## 2019-10-06 MED ORDER — SODIUM CHLORIDE 0.9% FLUSH
10.0000 mL | Freq: Once | INTRAVENOUS | Status: AC
  Administered 2019-10-06: 10 mL via INTRAVENOUS
  Filled 2019-10-06: qty 10

## 2019-10-06 MED ORDER — LEUCOVORIN CALCIUM INJECTION 350 MG
700.0000 mg | Freq: Once | INTRAVENOUS | Status: AC
  Administered 2019-10-06: 700 mg via INTRAVENOUS
  Filled 2019-10-06: qty 25

## 2019-10-06 MED ORDER — SODIUM CHLORIDE 0.9 % IV SOLN
10.0000 mg | Freq: Once | INTRAVENOUS | Status: AC
  Administered 2019-10-06: 10 mg via INTRAVENOUS
  Filled 2019-10-06: qty 10

## 2019-10-06 MED ORDER — DEXTROSE 5 % IV SOLN
Freq: Once | INTRAVENOUS | Status: AC
  Filled 2019-10-06: qty 250

## 2019-10-06 MED ORDER — SODIUM CHLORIDE 0.9 % IV SOLN
2400.0000 mg/m2 | INTRAVENOUS | Status: DC
  Administered 2019-10-06: 4200 mg via INTRAVENOUS
  Filled 2019-10-06: qty 84

## 2019-10-06 MED ORDER — FLUOROURACIL CHEMO INJECTION 2.5 GM/50ML
400.0000 mg/m2 | Freq: Once | INTRAVENOUS | Status: AC
  Administered 2019-10-06: 700 mg via INTRAVENOUS
  Filled 2019-10-06: qty 14

## 2019-10-06 MED ORDER — OXALIPLATIN CHEMO INJECTION 100 MG/20ML
85.0000 mg/m2 | Freq: Once | INTRAVENOUS | Status: AC
  Administered 2019-10-06: 150 mg via INTRAVENOUS
  Filled 2019-10-06: qty 30

## 2019-10-08 ENCOUNTER — Inpatient Hospital Stay: Payer: Medicare Other

## 2019-10-08 ENCOUNTER — Other Ambulatory Visit: Payer: Self-pay

## 2019-10-08 VITALS — BP 124/75 | HR 61 | Temp 97.3°F | Resp 18

## 2019-10-08 DIAGNOSIS — C155 Malignant neoplasm of lower third of esophagus: Secondary | ICD-10-CM

## 2019-10-08 DIAGNOSIS — Z5111 Encounter for antineoplastic chemotherapy: Secondary | ICD-10-CM | POA: Diagnosis not present

## 2019-10-08 DIAGNOSIS — R197 Diarrhea, unspecified: Secondary | ICD-10-CM | POA: Diagnosis not present

## 2019-10-08 DIAGNOSIS — D696 Thrombocytopenia, unspecified: Secondary | ICD-10-CM | POA: Diagnosis not present

## 2019-10-08 DIAGNOSIS — D649 Anemia, unspecified: Secondary | ICD-10-CM | POA: Diagnosis not present

## 2019-10-08 DIAGNOSIS — D709 Neutropenia, unspecified: Secondary | ICD-10-CM | POA: Diagnosis not present

## 2019-10-08 MED ORDER — HEPARIN SOD (PORK) LOCK FLUSH 100 UNIT/ML IV SOLN
500.0000 [IU] | Freq: Once | INTRAVENOUS | Status: AC | PRN
  Administered 2019-10-08: 13:00:00 500 [IU]
  Filled 2019-10-08: qty 5

## 2019-10-08 MED ORDER — PEGFILGRASTIM-CBQV 6 MG/0.6ML ~~LOC~~ SOSY
6.0000 mg | PREFILLED_SYRINGE | Freq: Once | SUBCUTANEOUS | Status: AC
  Administered 2019-10-08: 13:00:00 6 mg via SUBCUTANEOUS
  Filled 2019-10-08: qty 0.6

## 2019-10-08 MED ORDER — HEPARIN SOD (PORK) LOCK FLUSH 100 UNIT/ML IV SOLN
INTRAVENOUS | Status: AC
  Filled 2019-10-08: qty 5

## 2019-10-11 ENCOUNTER — Inpatient Hospital Stay: Payer: Medicare Other

## 2019-10-11 NOTE — Progress Notes (Signed)
Nutrition Follow-up:  Patient with recurrent adenocarcinoma of esophagus.  Patient receiving folfox.    Spoke with patient via phone for nutrition follow-up.  Patient reports that appetite continues to be good.  Reports that what he eat depends on how his stomach is feeling that day.  Reports diarrhea at times (ie apple cobbler recently, tomato sauce and tomato products, ice cream). Reports at times has to throw up several hours after eating.  Recently ate at daughters house and 2 hours later had to throw up.  Reports that foods do not get stuck in mid esophagus area since dilation.  Reports eating pizza this am for breakfast without difficulty.  Reports sometimes eats 3 eggs and pancakes.  Lunch recently was hamburger, pinto beans and creamed potatoes.  Supper last night was chicken and rice.  Drinking boost shakes (high protein) at times if stomach feels ok.      Medications: reviewed  Labs: reviewed  Anthropometrics:   Weight 137 lb 9.6 oz on 1/20 relatively stable from 138 lb on 12/23.   NUTRITION DIAGNOSIS: Inadequate oral intake stable   INTERVENTION:  Discussed lower sugar oral nutrition supplement options to try (boost max protein). Encouraged patient to monitor foods eaten and symptoms (vomiting and diarrhea).  Discussed high sugar foods and fatty foods can increase diarrhea.  Patient has contact information    MONITORING, EVALUATION, GOAL: Patient will consume adequate calories and protein to prevent weight loss   NEXT VISIT: March 8th phone f/u  Desirie Minteer B. Zenia Resides, [place], [place] Registered Dietitian [phone] (pager)

## 2019-10-12 ENCOUNTER — Other Ambulatory Visit: Payer: Self-pay

## 2019-10-13 ENCOUNTER — Inpatient Hospital Stay (HOSPITAL_BASED_OUTPATIENT_CLINIC_OR_DEPARTMENT_OTHER): Payer: Medicare Other | Admitting: Hospice and Palliative Medicine

## 2019-10-13 DIAGNOSIS — Z515 Encounter for palliative care: Secondary | ICD-10-CM

## 2019-10-13 DIAGNOSIS — G893 Neoplasm related pain (acute) (chronic): Secondary | ICD-10-CM

## 2019-10-13 DIAGNOSIS — C155 Malignant neoplasm of lower third of esophagus: Secondary | ICD-10-CM

## 2019-10-13 NOTE — Progress Notes (Signed)
Virtual Visit via Telephone Note  I connected with Corey Perry on 10/13/19 at  2:00 PM EST by telephone and verified that I am speaking with the correct person using two identifiers.   I discussed the limitations, risks, security and privacy concerns of performing an evaluation and management service by telephone and the availability of in person appointments. I also discussed with the patient that there may be a patient responsible charge related to this service. The patient expressed understanding and agreed to proceed.   History of Present Illness: Corey Perry is a 74 y.o. male with multiple medical problems including recurrent adenocarcinoma of the esophagus status post chemotherapy/XRT in 2017 with esophagectomy on 10/07/2016.  Patient has history of dysphagia with esophageal stricture requiring dilation.  He has had recent worsening dysphagia with weight loss.  Patient was found to have recurrent adenocarcinoma in the lower third of the esophagus.  Patient was referred to palliative care to help address goals and manage ongoing symptoms.   Observations/Objective: Called spoke with patient by phone.  He reports doing well.  He denies any significant changes or concerns.  No distressing symptoms reported.  Patient says it is appetite is good.  Performance status is stable.  He says he sleeping well.  No adverse effects reported from medications.  No needs today.  Assessment and Plan: Recurrent adenocarcinoma of esophagus -on FOLFOX.  Seems to be doing well and symptomatically controlled.  Followed by Dr. [name].  Next clinic appointment on 2/3  Follow Up Instructions: Follow-up telephone visit with me in about a month   I discussed the assessment and treatment plan with the patient. The patient was provided an opportunity to ask questions and all were answered. The patient agreed with the plan and demonstrated an understanding of the instructions.   The patient was advised to  call back or seek an in-person evaluation if the symptoms worsen or if the condition fails to improve as anticipated.  I provided 5 minutes of non-face-to-face time during this encounter.   Irean Hong, NP

## 2019-10-15 NOTE — Progress Notes (Signed)
[facility]  Telephone:([phone]) [phone] Fax:([phone]) [phone]  ID: Corey Perry OB: [birth year]  MR#: [id number]  AOZ#:[id number]  Patient Care Team: [name], MD as PCP - General (Family Medicine) [name], MD as PCP - Family Medicine (Family Medicine) [name], MD as Consulting Physician (Oncology) [name], MD as Referring Physician [name], MD as Consulting Physician (Cardiology)  CHIEF COMPLAINT: Recurrent adenocarcinoma of the lower third of the esophagus.  INTERVAL HISTORY: Patient returns to clinic today for further evaluation and consideration of cycle 4 of palliative FOLFOX.  He reports increased diarrhea approximately 48 times per day.  He also continues to have chronic weakness and fatigue.  He does not complain of dysphagia or difficulty swallowing today.  He has no neurologic complaints.  He denies any recent fevers or illnesses.  He denies any chest pain, shortness of breath, cough, or hemoptysis.  He denies any nausea, vomiting, or constipation. He has no melena or hematochezia.  He has no urinary complaints.  Patient offers no further specific complaints today.  REVIEW OF SYSTEMS:   Review of Systems  Constitutional: Positive for malaise/fatigue and weight loss. Negative for fever.  HENT: Negative.   Respiratory: Negative.  Negative for cough and shortness of breath.   Cardiovascular: Negative.  Negative for chest pain and leg swelling.  Gastrointestinal: Positive for diarrhea. Negative for abdominal pain, blood in stool, constipation, melena, nausea and vomiting.  Genitourinary: Negative.  Negative for frequency and urgency.  Musculoskeletal: Negative.  Negative for back pain.  Skin: Negative.  Negative for rash.  Neurological: Positive for weakness. Negative for tingling, focal weakness and headaches.  Psychiatric/Behavioral: Negative.  The patient is not nervous/anxious and does not have insomnia.     As  per HPI. Otherwise, a complete review of systems is negative.  PAST MEDICAL HISTORY: Past Medical History:  Diagnosis Date  . Abnormal white blood cell (WBC) count    seeing Dr. [name] at [facility] on 10/25  . Atrial fibrillation ([place])   . BPH (benign prostatic hypertrophy)   . Bradycardia   . CAD (coronary artery disease)   . Dysrhythmia    bradycardia - sees Dr. Humphrey Rolls  . Esophageal cancer ([place]) 05/2016  . GERD (gastroesophageal reflux disease)   . History of kidney stones   . Hyperlipidemia   . Hypertension   . Hypothyroidism   . Kidney stone   . Myocardial infarction ([place]) 2005  . S/P angioplasty with stent 2005   x 4 vessels  . Sleep apnea    mild-NO CPAP  . Spinal stenosis    lumbar    PAST SURGICAL HISTORY: Past Surgical History:  Procedure Laterality Date  . BACK SURGERY    . CARDIAC CATHETERIZATION Left 03/12/2016   Procedure: Left Heart Cath and Coronary Angiography;  Surgeon: [name], MD;  Location: [facility];  Service: Cardiovascular;  Laterality: Left;  . CARDIAC CATHETERIZATION N/A 03/12/2016   Procedure: Coronary Stent Intervention;  Surgeon: Yolonda Kida, MD;  Location: [facility];  Service: Cardiovascular;  Laterality: N/A;  . COLONOSCOPY WITH PROPOFOL N/A 07/13/2015   Procedure: COLONOSCOPY WITH PROPOFOL;  Surgeon: Lucilla Lame, MD;  Location: [facility];  Service: Endoscopy;  Laterality: N/A;  . CORONARY ANGIOPLASTY  10/05 and 12/05   4 DES placed  . CYSTOSCOPY W/ RETROGRADES Left 06/15/2019   Procedure: CYSTOSCOPY WITH RETROGRADE PYELOGRAM;  Surgeon: Abbie Sons, MD;  Location: [facility];  Service:  Urology;  Laterality: Left;  . CYSTOSCOPY/URETEROSCOPY/HOLMIUM LASER/STENT PLACEMENT Left 06/15/2019   Procedure: CYSTOSCOPY/URETEROSCOPY/HOLMIUM LASER/STENT PLACEMENT;  Surgeon: Abbie Sons, MD;  Location: [facility];  Service: Urology;  Laterality: Left;  . ESOPHAGECTOMY  10/07/2016   @ [facility]  .  ESOPHAGOGASTRODUODENOSCOPY (EGD) WITH PROPOFOL N/A 06/14/2016   Procedure: ESOPHAGOGASTRODUODENOSCOPY (EGD) WITH PROPOFOL;  Surgeon: [name], MD;  Location: [facility];  Service: Endoscopy;  Laterality: N/A;  . HERNIA REPAIR Bilateral    x3  . INGUINAL HERNIA REPAIR Right 08/28/2017   Large PerFix plug, recurrent hernia;  Surgeon: Robert Bellow, MD;  Location: [facility];  Service: General;  Laterality: Right;  recurrent  . KIDNEY STONE SURGERY  07/15/2019  . POLYPECTOMY  07/13/2015   Procedure: POLYPECTOMY;  Surgeon: Lucilla Lame, MD;  Location: [facility];  Service: Endoscopy;;  . PORT-A-CATH REMOVAL N/A 01/16/2017   Procedure: REMOVAL PORT-A-CATH;  Surgeon: Olean Ree, MD;  Location: [facility];  Service: General;  Laterality: N/A;  . PORTACATH PLACEMENT N/A 07/08/2016   Procedure: INSERTION PORT-A-CATH;  Surgeon: Olean Ree, MD;  Location: [facility];  Service: General;  Laterality: N/A;  . PORTACATH PLACEMENT Right 08/30/2019   Procedure: INSERTION PORT-A-CATH;  Surgeon: Olean Ree, MD;  Location: [facility];  Service: General;  Laterality: Right;    FAMILY HISTORY Family History  Problem Relation Age of Onset  . Hypertension Mother   . Dementia Mother   . Heart disease Father   . Hypertension Father   . Kidney disease Father   . Hypertension Brother   . Heart disease Brother   . Diabetes Son   . Hypertension Son   . Hyperlipidemia Son   . Hyperlipidemia Daughter   . Hypertension Daughter        ADVANCED DIRECTIVES:    HEALTH MAINTENANCE: Social History   Tobacco Use  . Smoking status: Former Smoker    Packs/day: 1.00    Years: 40.00    Pack years: 40.00    Types: Cigarettes    Quit date: 07/16/2004    Years since quitting: 15.2  . Smokeless tobacco: Former Systems developer    Types: Chew  Substance Use Topics  . Alcohol use: Yes    Alcohol/week: 1.0 standard drinks    Types: 1 Cans of beer per week  . Drug use: No     Colonoscopy:  PAP:   Bone density:  Lipid panel:  No Known Allergies  Current Outpatient Medications  Medication Sig Dispense Refill  . acetaminophen (TYLENOL) 500 MG tablet Take 500 mg by mouth every 6 (six) hours as needed for mild pain or moderate pain.    Marland Kitchen aspirin 81 MG tablet Take 81 mg by mouth daily.     Marland Kitchen atorvastatin (LIPITOR) 40 MG tablet Take 1 tablet (40 mg total) by mouth daily. 90 tablet 1  . clopidogrel (PLAVIX) 75 MG tablet Take 1 tablet (75 mg total) by mouth daily. 90 tablet 2  . diphenoxylate-atropine (LOMOTIL) 2.5-0.025 MG tablet Take 2 tablets by mouth 4 (four) times daily as needed for diarrhea or loose stools. 30 tablet 0  . HYDROcodone-acetaminophen (NORCO/VICODIN) 5-325 MG tablet Take 1 tablet by mouth every 6 (six) hours as needed for moderate pain. 15 tablet 0  . isosorbide mononitrate (IMDUR) 30 MG 24 hr tablet Take 1 tablet (30 mg total) by mouth every evening. 30 tablet 1  . levothyroxine (SYNTHROID) 50 MCG tablet Take 1 tablet (50 mcg total) by mouth daily. 90 tablet 1  . lidocaine-prilocaine (  EMLA) cream Apply to affected area once (Patient taking differently: Apply 1 application topically once. Apply to affected area once) 30 g 3  . lisinopril (ZESTRIL) 10 MG tablet Take 1 tablet (10 mg total) by mouth every morning. 90 tablet 0  . loperamide (IMODIUM A-D) 2 MG tablet Take 2 mg by mouth 4 (four) times daily as needed for diarrhea or loose stools.    . metoprolol succinate (TOPROL-XL) 25 MG 24 hr tablet Take 25 mg by mouth daily.    . multivitamin-iron-minerals-folic acid (CENTRUM) chewable tablet Chew 1 tablet by mouth daily.    . nitroGLYCERIN (NITROSTAT) 0.4 MG SL tablet Place 1 tablet under the tongue every 5 (five) minutes as needed for chest pain.   98  . ondansetron (ZOFRAN) 8 MG tablet Take 1 tablet (8 mg total) by mouth 2 (two) times daily as needed for refractory nausea / vomiting. Start on day 3 after chemotherapy. 60 tablet 2  . pantoprazole (PROTONIX) 20 MG tablet Take  1 tablet (20 mg total) by mouth daily. 90 tablet 0  . prochlorperazine (COMPAZINE) 10 MG tablet Take 1 tablet (10 mg total) by mouth every 6 (six) hours as needed (Nausea or vomiting). 60 tablet 2   No current facility-administered medications for this visit.   Facility-Administered Medications Ordered in Other Visits  Medication Dose Route Frequency Provider Last Rate Last Admin  . dexamethasone (DECADRON) 10 mg in sodium chloride 0.9 % 50 mL IVPB  10 mg Intravenous Once [name], MD      . dextrose 5 % solution   Intravenous Once [name], MD      . fluorouracil (ADRUCIL) 4,200 mg in sodium chloride 0.9 % 66 mL chemo infusion  2,400 mg/m2 (Treatment Plan Recorded) Intravenous 1 day or 1 dose [name], MD      . fluorouracil (ADRUCIL) chemo injection 700 mg  400 mg/m2 (Treatment Plan Recorded) Intravenous Once [name], MD      . leucovorin 704 mg in dextrose 5 % 250 mL infusion  400 mg/m2 (Treatment Plan Recorded) Intravenous Once [name], MD      . oxaliplatin (ELOXATIN) 150 mg in dextrose 5 % 500 mL chemo infusion  85 mg/m2 (Treatment Plan Recorded) Intravenous Once [name], MD      . palonosetron (ALOXI) injection 0.25 mg  0.25 mg Intravenous Once [name], MD      . sodium chloride flush (NS) 0.9 % injection 10 mL  10 mL Intravenous Once [name], MD        OBJECTIVE: Vitals:   10/20/19 0905  BP: 122/77  Pulse: 61  Temp: (!) 96.5 F (35.8 C)     Body mass index is 19.83 kg/m.    ECOG FS:0 - Asymptomatic  General: Thin, no acute distress. Eyes: Pink conjunctiva, anicteric sclera. HEENT: Normocephalic, moist mucous membranes. Lungs: No audible wheezing or coughing. Heart: Regular rate and rhythm. Abdomen: Soft, nontender, no obvious distention. Musculoskeletal: No edema, cyanosis, or clubbing. Neuro: Alert, answering all questions appropriately. Cranial nerves grossly intact. Skin: No  rashes or petechiae noted. Psych: Normal affect.  LAB RESULTS:  Lab Results  Component Value Date   NA 137 10/20/2019   K 3.5 10/20/2019   CL 107 10/20/2019   CO2 22 10/20/2019   GLUCOSE 95 10/20/2019   BUN 13 10/20/2019   CREATININE 1.14 10/20/2019   CALCIUM 8.4 (L) 10/20/2019   PROT 6.9 10/20/2019   ALBUMIN  3.4 (L) 10/20/2019   AST 19 10/20/2019   ALT 18 10/20/2019   ALKPHOS 75 10/20/2019   BILITOT 0.6 10/20/2019   GFRNONAA >60 10/20/2019   GFRAA >60 10/20/2019    Lab Results  Component Value Date   WBC 11.0 (H) 10/20/2019   NEUTROABS 9.2 (H) 10/20/2019   HGB 8.9 (L) 10/20/2019   HCT 29.3 (L) 10/20/2019   MCV 102.4 (H) 10/20/2019   PLT 117 (L) 10/20/2019     STUDIES: No results found.  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  PLAN:    1.  Recurrent adenocarcinoma of the lower third of the esophagus: Patient initially completed 6 cycles of weekly carboplatinum and Taxol on August 21, 2016 and daily XRT completed on August 27, 2016. Patient had his esophagectomy on October 07, 2016.  His most [facility] at [facility] suggested recurrence which was confirmed with biopsy.  Per recommendation of [facility], will proceed with palliative FOLFOX.  Patient now has had port placement. Awaiting final pathology results to assess whether nivolumab will be of benefit.  Proceed with cycle 4 of FOLFOX today.  Return to clinic in 2 days for pump removal and Udenyca.  Patient will then return to clinic in 2 weeks for further evaluation and consideration of cycle 5. 2.  Diarrhea: Patient was given a prescription for Lomotil today. 3.  Thrombocytopenia: Chronic and unchanged. Proceed with treatment as above. 4.  Renal insufficiency: Resolved. 5.  Anemia: Chronic and unchanged.  Patient's hemoglobin is 8.9 today. 6.  Neutropenia: Add Udenyca with the remainder of treatment as above.   Patient expressed understanding and was in agreement  with this plan. He also understands that He can call clinic at any time with any questions, concerns, or complaints.    [name], MD 10/20/19 10:12 AM

## 2019-10-20 ENCOUNTER — Other Ambulatory Visit: Payer: Self-pay

## 2019-10-20 ENCOUNTER — Encounter: Payer: Self-pay | Admitting: Oncology

## 2019-10-20 ENCOUNTER — Inpatient Hospital Stay: Payer: Medicare Other | Attending: Oncology

## 2019-10-20 ENCOUNTER — Inpatient Hospital Stay (HOSPITAL_BASED_OUTPATIENT_CLINIC_OR_DEPARTMENT_OTHER): Payer: Medicare Other | Admitting: Oncology

## 2019-10-20 ENCOUNTER — Inpatient Hospital Stay: Payer: Medicare Other

## 2019-10-20 VITALS — BP 122/77 | HR 61 | Temp 96.5°F | Wt 134.3 lb

## 2019-10-20 DIAGNOSIS — E039 Hypothyroidism, unspecified: Secondary | ICD-10-CM | POA: Diagnosis not present

## 2019-10-20 DIAGNOSIS — Z818 Family history of other mental and behavioral disorders: Secondary | ICD-10-CM | POA: Diagnosis not present

## 2019-10-20 DIAGNOSIS — R531 Weakness: Secondary | ICD-10-CM | POA: Diagnosis not present

## 2019-10-20 DIAGNOSIS — I2510 Atherosclerotic heart disease of native coronary artery without angina pectoris: Secondary | ICD-10-CM | POA: Diagnosis not present

## 2019-10-20 DIAGNOSIS — Z8249 Family history of ischemic heart disease and other diseases of the circulatory system: Secondary | ICD-10-CM | POA: Insufficient documentation

## 2019-10-20 DIAGNOSIS — I4891 Unspecified atrial fibrillation: Secondary | ICD-10-CM | POA: Insufficient documentation

## 2019-10-20 DIAGNOSIS — Z5189 Encounter for other specified aftercare: Secondary | ICD-10-CM | POA: Insufficient documentation

## 2019-10-20 DIAGNOSIS — Z8501 Personal history of malignant neoplasm of esophagus: Secondary | ICD-10-CM | POA: Diagnosis not present

## 2019-10-20 DIAGNOSIS — C155 Malignant neoplasm of lower third of esophagus: Secondary | ICD-10-CM

## 2019-10-20 DIAGNOSIS — D709 Neutropenia, unspecified: Secondary | ICD-10-CM | POA: Insufficient documentation

## 2019-10-20 DIAGNOSIS — D696 Thrombocytopenia, unspecified: Secondary | ICD-10-CM | POA: Insufficient documentation

## 2019-10-20 DIAGNOSIS — E785 Hyperlipidemia, unspecified: Secondary | ICD-10-CM | POA: Insufficient documentation

## 2019-10-20 DIAGNOSIS — Z87891 Personal history of nicotine dependence: Secondary | ICD-10-CM | POA: Diagnosis not present

## 2019-10-20 DIAGNOSIS — Z841 Family history of disorders of kidney and ureter: Secondary | ICD-10-CM | POA: Diagnosis not present

## 2019-10-20 DIAGNOSIS — Z833 Family history of diabetes mellitus: Secondary | ICD-10-CM | POA: Insufficient documentation

## 2019-10-20 DIAGNOSIS — I252 Old myocardial infarction: Secondary | ICD-10-CM | POA: Insufficient documentation

## 2019-10-20 DIAGNOSIS — N400 Enlarged prostate without lower urinary tract symptoms: Secondary | ICD-10-CM | POA: Diagnosis not present

## 2019-10-20 DIAGNOSIS — Z79899 Other long term (current) drug therapy: Secondary | ICD-10-CM | POA: Diagnosis not present

## 2019-10-20 DIAGNOSIS — N289 Disorder of kidney and ureter, unspecified: Secondary | ICD-10-CM | POA: Insufficient documentation

## 2019-10-20 DIAGNOSIS — Z5111 Encounter for antineoplastic chemotherapy: Secondary | ICD-10-CM | POA: Diagnosis not present

## 2019-10-20 DIAGNOSIS — D649 Anemia, unspecified: Secondary | ICD-10-CM | POA: Diagnosis not present

## 2019-10-20 DIAGNOSIS — R5383 Other fatigue: Secondary | ICD-10-CM | POA: Diagnosis not present

## 2019-10-20 DIAGNOSIS — Z87442 Personal history of urinary calculi: Secondary | ICD-10-CM | POA: Diagnosis not present

## 2019-10-20 DIAGNOSIS — R197 Diarrhea, unspecified: Secondary | ICD-10-CM | POA: Insufficient documentation

## 2019-10-20 LAB — COMPREHENSIVE METABOLIC PANEL
ALT: 18 U/L (ref 0–44)
AST: 19 U/L (ref 15–41)
Albumin: 3.4 g/dL — ABNORMAL LOW (ref 3.5–5.0)
Alkaline Phosphatase: 75 U/L (ref 38–126)
Anion gap: 8 (ref 5–15)
BUN: 13 mg/dL (ref 8–23)
CO2: 22 mmol/L (ref 22–32)
Calcium: 8.4 mg/dL — ABNORMAL LOW (ref 8.9–10.3)
Chloride: 107 mmol/L (ref 98–111)
Creatinine, Ser: 1.14 mg/dL (ref 0.61–1.24)
GFR calc Af Amer: >60 mL/min (ref >60)
GFR calc non Af Amer: >60 mL/min (ref >60)
Glucose, Bld: 95 mg/dL (ref 70–99)
Potassium: 3.5 mmol/L (ref 3.5–5.1)
Sodium: 137 mmol/L (ref 135–145)
Total Bilirubin: 0.6 mg/dL (ref 0.3–1.2)
Total Protein: 6.9 g/dL (ref 6.5–8.1)

## 2019-10-20 LAB — CBC WITH DIFFERENTIAL/PLATELET
Abs Immature Granulocytes: 0.1 10*3/uL — ABNORMAL HIGH (ref 0.00–0.07)
Basophils Absolute: 0 10*3/uL (ref 0.0–0.1)
Basophils Relative: 0 %
Eosinophils Absolute: 0.1 10*3/uL (ref 0.0–0.5)
Eosinophils Relative: 1 %
HCT: 29.3 % — ABNORMAL LOW (ref 39.0–52.0)
Hemoglobin: 8.9 g/dL — ABNORMAL LOW (ref 13.0–17.0)
Immature Granulocytes: 1 %
Lymphocytes Relative: 6 %
Lymphs Abs: 0.7 10*3/uL (ref 0.7–4.0)
MCH: 31.1 pg (ref 26.0–34.0)
MCHC: 30.4 g/dL (ref 30.0–36.0)
MCV: 102.4 fL — ABNORMAL HIGH (ref 80.0–100.0)
Monocytes Absolute: 0.9 10*3/uL (ref 0.1–1.0)
Monocytes Relative: 8 %
Neutro Abs: 9.2 10*3/uL — ABNORMAL HIGH (ref 1.7–7.7)
Neutrophils Relative %: 84 %
Platelets: 117 10*3/uL — ABNORMAL LOW (ref 150–400)
RBC: 2.86 MIL/uL — ABNORMAL LOW (ref 4.22–5.81)
RDW: 18.6 % — ABNORMAL HIGH (ref 11.5–15.5)
WBC: 11 10*3/uL — ABNORMAL HIGH (ref 4.0–10.5)
nRBC: 0 % (ref 0.0–0.2)

## 2019-10-20 MED ORDER — PALONOSETRON HCL INJECTION 0.25 MG/5ML
0.2500 mg | Freq: Once | INTRAVENOUS | Status: AC
  Administered 2019-10-20: 0.25 mg via INTRAVENOUS
  Filled 2019-10-20: qty 5

## 2019-10-20 MED ORDER — SODIUM CHLORIDE 0.9% FLUSH
10.0000 mL | Freq: Once | INTRAVENOUS | Status: AC
  Administered 2019-10-20: 10 mL via INTRAVENOUS
  Filled 2019-10-20: qty 10

## 2019-10-20 MED ORDER — DEXTROSE 5 % IV SOLN
Freq: Once | INTRAVENOUS | Status: AC
  Filled 2019-10-20: qty 250

## 2019-10-20 MED ORDER — DIPHENOXYLATE-ATROPINE 2.5-0.025 MG PO TABS: 2.0000 | ORAL_TABLET | Freq: Four times a day (QID) | ORAL | 0 refills | Status: DC | PRN

## 2019-10-20 MED ORDER — FLUOROURACIL CHEMO INJECTION 2.5 GM/50ML
400.0000 mg/m2 | Freq: Once | INTRAVENOUS | Status: AC
  Administered 2019-10-20: 700 mg via INTRAVENOUS
  Filled 2019-10-20: qty 14

## 2019-10-20 MED ORDER — LEUCOVORIN CALCIUM INJECTION 350 MG
398.0000 mg/m2 | Freq: Once | INTRAVENOUS | Status: AC
  Administered 2019-10-20: 700 mg via INTRAVENOUS
  Filled 2019-10-20: qty 25

## 2019-10-20 MED ORDER — SODIUM CHLORIDE 0.9 % IV SOLN
2400.0000 mg/m2 | INTRAVENOUS | Status: DC
  Administered 2019-10-20: 4200 mg via INTRAVENOUS
  Filled 2019-10-20: qty 84

## 2019-10-20 MED ORDER — OXALIPLATIN CHEMO INJECTION 100 MG/20ML
85.0000 mg/m2 | Freq: Once | INTRAVENOUS | Status: AC
  Administered 2019-10-20: 150 mg via INTRAVENOUS
  Filled 2019-10-20: qty 20

## 2019-10-20 MED ORDER — SODIUM CHLORIDE 0.9 % IV SOLN
10.0000 mg | Freq: Once | INTRAVENOUS | Status: AC
  Administered 2019-10-20: 10 mg via INTRAVENOUS
  Filled 2019-10-20: qty 1

## 2019-10-22 ENCOUNTER — Other Ambulatory Visit: Payer: Self-pay

## 2019-10-22 ENCOUNTER — Inpatient Hospital Stay: Payer: Medicare Other

## 2019-10-22 DIAGNOSIS — R197 Diarrhea, unspecified: Secondary | ICD-10-CM | POA: Diagnosis not present

## 2019-10-22 DIAGNOSIS — C155 Malignant neoplasm of lower third of esophagus: Secondary | ICD-10-CM | POA: Diagnosis not present

## 2019-10-22 DIAGNOSIS — Z5111 Encounter for antineoplastic chemotherapy: Secondary | ICD-10-CM | POA: Diagnosis not present

## 2019-10-22 DIAGNOSIS — Z5189 Encounter for other specified aftercare: Secondary | ICD-10-CM | POA: Diagnosis not present

## 2019-10-22 DIAGNOSIS — N289 Disorder of kidney and ureter, unspecified: Secondary | ICD-10-CM | POA: Diagnosis not present

## 2019-10-22 DIAGNOSIS — R531 Weakness: Secondary | ICD-10-CM | POA: Diagnosis not present

## 2019-10-22 MED ORDER — HEPARIN SOD (PORK) LOCK FLUSH 100 UNIT/ML IV SOLN
500.0000 [IU] | Freq: Once | INTRAVENOUS | Status: AC | PRN
  Administered 2019-10-22: 13:00:00 500 [IU]
  Filled 2019-10-22: qty 5

## 2019-10-22 MED ORDER — PEGFILGRASTIM-CBQV 6 MG/0.6ML ~~LOC~~ SOSY
6.0000 mg | PREFILLED_SYRINGE | Freq: Once | SUBCUTANEOUS | Status: AC
  Administered 2019-10-22: 6 mg via SUBCUTANEOUS
  Filled 2019-10-22: qty 0.6

## 2019-10-22 MED ORDER — SODIUM CHLORIDE 0.9% FLUSH
10.0000 mL | INTRAVENOUS | Status: DC | PRN
  Administered 2019-10-22: 13:00:00 10 mL
  Filled 2019-10-22: qty 10

## 2019-10-22 NOTE — Progress Notes (Signed)
Reviewed 10/20/19 labs with Dr. [name] (WBC 11, ANC 9.2) Per Dr. [name] proceed with Corey Perry as scheduled.

## 2019-10-25 ENCOUNTER — Other Ambulatory Visit: Payer: Self-pay | Admitting: *Deleted

## 2019-10-25 ENCOUNTER — Other Ambulatory Visit: Payer: Self-pay | Admitting: Emergency Medicine

## 2019-10-25 ENCOUNTER — Ambulatory Visit (INDEPENDENT_AMBULATORY_CARE_PROVIDER_SITE_OTHER): Payer: Medicare Other | Admitting: Family Medicine

## 2019-10-25 ENCOUNTER — Encounter: Payer: Self-pay | Admitting: Family Medicine

## 2019-10-25 ENCOUNTER — Other Ambulatory Visit: Payer: Self-pay

## 2019-10-25 VITALS — BP 106/63 | Temp 97.4°F | Wt 129.5 lb

## 2019-10-25 DIAGNOSIS — I10 Essential (primary) hypertension: Secondary | ICD-10-CM | POA: Diagnosis not present

## 2019-10-25 DIAGNOSIS — C155 Malignant neoplasm of lower third of esophagus: Secondary | ICD-10-CM

## 2019-10-25 DIAGNOSIS — E039 Hypothyroidism, unspecified: Secondary | ICD-10-CM

## 2019-10-25 DIAGNOSIS — N183 Chronic kidney disease, stage 3 unspecified: Secondary | ICD-10-CM

## 2019-10-25 DIAGNOSIS — N400 Enlarged prostate without lower urinary tract symptoms: Secondary | ICD-10-CM | POA: Diagnosis not present

## 2019-10-25 DIAGNOSIS — D696 Thrombocytopenia, unspecified: Secondary | ICD-10-CM

## 2019-10-25 DIAGNOSIS — I2583 Coronary atherosclerosis due to lipid rich plaque: Secondary | ICD-10-CM | POA: Diagnosis not present

## 2019-10-25 DIAGNOSIS — E785 Hyperlipidemia, unspecified: Secondary | ICD-10-CM | POA: Diagnosis not present

## 2019-10-25 DIAGNOSIS — I129 Hypertensive chronic kidney disease with stage 1 through stage 4 chronic kidney disease, or unspecified chronic kidney disease: Secondary | ICD-10-CM

## 2019-10-25 DIAGNOSIS — Z0000 Encounter for general adult medical examination without abnormal findings: Secondary | ICD-10-CM

## 2019-10-25 DIAGNOSIS — D692 Other nonthrombocytopenic purpura: Secondary | ICD-10-CM

## 2019-10-25 DIAGNOSIS — I4891 Unspecified atrial fibrillation: Secondary | ICD-10-CM | POA: Diagnosis not present

## 2019-10-25 DIAGNOSIS — K219 Gastro-esophageal reflux disease without esophagitis: Secondary | ICD-10-CM | POA: Diagnosis not present

## 2019-10-25 DIAGNOSIS — I2510 Atherosclerotic heart disease of native coronary artery without angina pectoris: Secondary | ICD-10-CM | POA: Diagnosis not present

## 2019-10-25 MED ORDER — ATORVASTATIN CALCIUM 40 MG PO TABS: 40.0000 mg | ORAL_TABLET | Freq: Every day | ORAL | 1 refills | Status: DC

## 2019-10-25 MED ORDER — LISINOPRIL 2.5 MG PO TABS: 2.5000 mg | ORAL_TABLET | ORAL | 1 refills | Status: DC

## 2019-10-25 MED ORDER — PANTOPRAZOLE SODIUM 20 MG PO TBEC: 20.0000 mg | DELAYED_RELEASE_TABLET | Freq: Every day | ORAL | 0 refills | Status: DC

## 2019-10-25 MED ORDER — DIPHENOXYLATE-ATROPINE 2.5-0.025 MG PO TABS: 2.0000 | ORAL_TABLET | Freq: Four times a day (QID) | ORAL | 0 refills | Status: DC | PRN

## 2019-10-25 MED ORDER — CLOPIDOGREL BISULFATE 75 MG PO TABS: 75.0000 mg | ORAL_TABLET | Freq: Every day | ORAL | 2 refills | Status: DC

## 2019-10-25 MED ORDER — ISOSORBIDE MONONITRATE ER 30 MG PO TB24: 30.0000 mg | ORAL_TABLET | Freq: Every evening | ORAL | 1 refills | Status: DC

## 2019-10-25 NOTE — Telephone Encounter (Signed)
I resent it so that it would go to the pharmacy.

## 2019-10-25 NOTE — Assessment & Plan Note (Signed)
Reassured patient. Call with any concerns. Continue to monitor.

## 2019-10-25 NOTE — Assessment & Plan Note (Signed)
Will recheck levels today. Adjust dose as needed. Call with any concerns.

## 2019-10-25 NOTE — Assessment & Plan Note (Signed)
Feeling well. Continue to follow with cardiology. Call with any concerns. Continue to monitor.

## 2019-10-25 NOTE — Assessment & Plan Note (Signed)
Over treated with weight loss. Will decrease his lisinopril to 2.5mg  and recheck 3 months. Call with any concerns. Continue to monitor.

## 2019-10-25 NOTE — Progress Notes (Signed)
BP 106/63   Temp (!) 97.4 F (36.3 C)   Wt 129 lb 8 oz (58.7 kg)   BMI 19.12 kg/m    Subjective:    Patient ID: Corey Perry, male    DOB: May 16, 1946, 74 y.o.   MRN: [id number]  HPI: Corey Perry is a 74 y.o. male presenting on 10/25/2019 for comprehensive medical examination. Current medical complaints include:  HYPERTENSION / HYPERLIPIDEMIA Satisfied with current treatment? yes Duration of hypertension: chronic BP monitoring frequency: not checking BP medication side effects: no Past BP meds: lisinopril, imdur, metoprolol Duration of hyperlipidemia: chronic Cholesterol medication side effects: no Cholesterol supplements: none Past cholesterol medications: atorvastatin Medication compliance: excellent compliance Aspirin: yes Recent stressors: no Recurrent headaches: no Visual changes: no Palpitations: no Dyspnea: no Chest pain: no Lower extremity edema: no Dizzy/lightheaded: yes  BPH BPH status: controlled Satisfied with current treatment?: yes Medication side effects: no Medication compliance: excellent compliance Duration: chronic Nocturia: 2-3x per night Urinary frequency:yes Incomplete voiding: no Urgency: no Weak urinary stream: no Straining to start stream: no Dysuria: no Onset: gradual Severity: mild  HYPOTHYROIDISM Thyroid control status:controlled Satisfied with current treatment? yes Medication side effects: no Medication compliance: excellent compliance Recent dose adjustment:no Fatigue: yes Cold intolerance: yes Heat intolerance: no Weight gain: no Weight loss: yes Constipation: no Diarrhea/loose stools: yes Palpitations: no Lower extremity edema: no Anxiety/depressed mood: no  He currently lives with: wife Interim Problems from his last visit: no  Functional Status Survey: Is the patient deaf or have difficulty hearing?: Yes Does the patient have difficulty seeing, even when wearing glasses/contacts?: No Does the  patient have difficulty concentrating, remembering, or making decisions?: No Does the patient have difficulty walking or climbing stairs?: No Does the patient have difficulty dressing or bathing?: No Does the patient have difficulty doing errands alone such as visiting a doctor's office or shopping?: No  FALL RISK: Fall Risk  10/25/2019 09/14/2019 08/04/2018 07/03/2018 07/29/2017  Falls in the past year? 0 0 0 No No  Comment - - Emmi Telephone Survey: data to providers prior to load - -  Number falls in past yr: 0 - - - -  Injury with Fall? 0 - - - -    Depression Screen Depression screen [facility] 2/9 10/25/2019 07/03/2018 07/02/2017 06/24/2016 06/21/2015  Decreased Interest 0 0 0 0 0  Down, Depressed, Hopeless 0 0 0 0 0  PHQ - 2 Score 0 0 0 0 0  Altered sleeping 0 - - - -  Tired, decreased energy 0 - - - -  Change in appetite 0 - - - -  Feeling bad or failure about yourself  0 - - - -  Trouble concentrating 0 - - - -  Moving slowly or fidgety/restless 0 - - - -  Suicidal thoughts 0 - - - -  PHQ-9 Score 0 - - - -  Difficult doing work/chores Not difficult at all - - - -    Past Medical History:  Past Medical History:  Diagnosis Date  . Abnormal white blood cell (WBC) count    seeing Dr. [name] at [place] on 10/25  . Atrial fibrillation ([place])   . BPH (benign prostatic hypertrophy)   . Bradycardia   . CAD (coronary artery disease)   . Dysrhythmia    bradycardia - sees Dr. Humphrey Rolls  . Esophageal cancer ([place]) 05/2016  . GERD (gastroesophageal reflux disease)   . History of kidney stones   . Hyperlipidemia   . Hypertension   .  Hypothyroidism   . Kidney stone   . Myocardial infarction ([place]) 2005  . S/P angioplasty with stent 2005   x 4 vessels  . Sleep apnea    mild-NO CPAP  . Spinal stenosis    lumbar    Surgical History:  Past Surgical History:  Procedure Laterality Date  . BACK SURGERY    . CARDIAC CATHETERIZATION Left 03/12/2016   Procedure: Left Heart Cath and Coronary  Angiography;  Surgeon: [name], MD;  Location: [facility];  Service: Cardiovascular;  Laterality: Left;  . CARDIAC CATHETERIZATION N/A 03/12/2016   Procedure: Coronary Stent Intervention;  Surgeon: Yolonda Kida, MD;  Location: [facility];  Service: Cardiovascular;  Laterality: N/A;  . COLONOSCOPY WITH PROPOFOL N/A 07/13/2015   Procedure: COLONOSCOPY WITH PROPOFOL;  Surgeon: Lucilla Lame, MD;  Location: [facility];  Service: Endoscopy;  Laterality: N/A;  . CORONARY ANGIOPLASTY  10/05 and 12/05   4 DES placed  . CYSTOSCOPY W/ RETROGRADES Left 06/15/2019   Procedure: CYSTOSCOPY WITH RETROGRADE PYELOGRAM;  Surgeon: Abbie Sons, MD;  Location: [facility];  Service: Urology;  Laterality: Left;  . CYSTOSCOPY/URETEROSCOPY/HOLMIUM LASER/STENT PLACEMENT Left 06/15/2019   Procedure: CYSTOSCOPY/URETEROSCOPY/HOLMIUM LASER/STENT PLACEMENT;  Surgeon: Abbie Sons, MD;  Location: [facility];  Service: Urology;  Laterality: Left;  . ESOPHAGECTOMY  10/07/2016   @ [facility]  . ESOPHAGOGASTRODUODENOSCOPY (EGD) WITH PROPOFOL N/A 06/14/2016   Procedure: ESOPHAGOGASTRODUODENOSCOPY (EGD) WITH PROPOFOL;  Surgeon: [name], MD;  Location: [facility];  Service: Endoscopy;  Laterality: N/A;  . HERNIA REPAIR Bilateral    x3  . INGUINAL HERNIA REPAIR Right 08/28/2017   Large PerFix plug, recurrent hernia;  Surgeon: Robert Bellow, MD;  Location: [facility];  Service: General;  Laterality: Right;  recurrent  . KIDNEY STONE SURGERY  07/15/2019  . POLYPECTOMY  07/13/2015   Procedure: POLYPECTOMY;  Surgeon: Lucilla Lame, MD;  Location: [facility];  Service: Endoscopy;;  . PORT-A-CATH REMOVAL N/A 01/16/2017   Procedure: REMOVAL PORT-A-CATH;  Surgeon: Olean Ree, MD;  Location: [facility];  Service: General;  Laterality: N/A;  . PORTACATH PLACEMENT N/A 07/08/2016   Procedure: INSERTION PORT-A-CATH;  Surgeon: Olean Ree, MD;  Location: [facility];  Service: General;   Laterality: N/A;  . PORTACATH PLACEMENT Right 08/30/2019   Procedure: INSERTION PORT-A-CATH;  Surgeon: Olean Ree, MD;  Location: [facility];  Service: General;  Laterality: Right;    Medications:  Current Outpatient Medications on File Prior to Visit  Medication Sig  . acetaminophen (TYLENOL) 500 MG tablet Take 500 mg by mouth every 6 (six) hours as needed for mild pain or moderate pain.  Marland Kitchen aspirin 81 MG tablet Take 81 mg by mouth daily.   Marland Kitchen levothyroxine (SYNTHROID) 50 MCG tablet Take 1 tablet (50 mcg total) by mouth daily.  Marland Kitchen loperamide (IMODIUM A-D) 2 MG tablet Take 2 mg by mouth 4 (four) times daily as needed for diarrhea or loose stools.  . metoprolol succinate (TOPROL-XL) 25 MG 24 hr tablet Take 25 mg by mouth daily.  . multivitamin-iron-minerals-folic acid (CENTRUM) chewable tablet Chew 1 tablet by mouth daily.  . nitroGLYCERIN (NITROSTAT) 0.4 MG SL tablet Place 1 tablet under the tongue every 5 (five) minutes as needed for chest pain.   Marland Kitchen ondansetron (ZOFRAN) 8 MG tablet Take 1 tablet (8 mg total) by mouth 2 (two) times daily as needed for refractory nausea / vomiting. Start on day 3 after chemotherapy.  . prochlorperazine (COMPAZINE) 10 MG tablet  Take 1 tablet (10 mg total) by mouth every 6 (six) hours as needed (Nausea or vomiting).   No current facility-administered medications on file prior to visit.    Allergies:  No Known Allergies  Social History:  Social History   Socioeconomic History  . Marital status: Married    Spouse name: Not on file  . Number of children: Not on file  . Years of education: Not on file  . Highest education level: 9th grade  Occupational History  . Not on file  Tobacco Use  . Smoking status: Former Smoker    Packs/day: 1.00    Years: 40.00    Pack years: 40.00    Types: Cigarettes    Quit date: 07/16/2004    Years since quitting: 15.2  . Smokeless tobacco: Former Systems developer    Types: Chew  Substance and Sexual Activity  . Alcohol use:  Yes    Alcohol/week: 1.0 standard drinks    Types: 1 Cans of beer per week  . Drug use: No  . Sexual activity: Yes    Birth control/protection: None  Other Topics Concern  . Not on file  Social History Narrative  . Not on file   Social Determinants of Health   Financial Resource Strain:   . Difficulty of Paying Living Expenses: Not on file  Food Insecurity:   . Worried About Charity fundraiser in the Last Year: Not on file  . Ran Out of Food in the Last Year: Not on file  Transportation Needs:   . Lack of Transportation (Medical): Not on file  . Lack of Transportation (Non-Medical): Not on file  Physical Activity:   . Days of Exercise per Week: Not on file  . Minutes of Exercise per Session: Not on file  Stress:   . Feeling of Stress : Not on file  Social Connections:   . Frequency of Communication with Friends and Family: Not on file  . Frequency of Social Gatherings with Friends and Family: Not on file  . Attends Religious Services: Not on file  . Active Member of Clubs or Organizations: Not on file  . Attends Archivist Meetings: Not on file  . Marital Status: Not on file  Intimate Partner Violence:   . Fear of Current or Ex-Partner: Not on file  . Emotionally Abused: Not on file  . Physically Abused: Not on file  . Sexually Abused: Not on file   Social History   Tobacco Use  Smoking Status Former Smoker  . Packs/day: 1.00  . Years: 40.00  . Pack years: 40.00  . Types: Cigarettes  . Quit date: 07/16/2004  . Years since quitting: 15.2  Smokeless Tobacco Former Systems developer  . Types: Chew   Social History   Substance and Sexual Activity  Alcohol Use Yes  . Alcohol/week: 1.0 standard drinks  . Types: 1 Cans of beer per week    Family History:  Family History  Problem Relation Age of Onset  . Hypertension Mother   . Dementia Mother   . Heart disease Father   . Hypertension Father   . Kidney disease Father   . Hypertension Brother   . Heart  disease Brother   . Diabetes Son   . Hypertension Son   . Hyperlipidemia Son   . Hyperlipidemia Daughter   . Hypertension Daughter     Past medical history, surgical history, medications, allergies, family history and social history reviewed with patient today and changes made to appropriate  areas of the chart.   Review of Systems  Constitutional: Positive for chills and weight loss. Negative for diaphoresis, fever and malaise/fatigue.  HENT: Positive for hearing loss. Negative for congestion, ear discharge, ear pain, nosebleeds, sinus pain, sore throat and tinnitus.   Eyes: Negative.   Respiratory: Positive for cough. Negative for hemoptysis, sputum production, shortness of breath, wheezing and stridor.   Cardiovascular: Positive for palpitations. Negative for chest pain, orthopnea, claudication, leg swelling and PND.  Gastrointestinal: Positive for diarrhea. Negative for abdominal pain, blood in stool, constipation, heartburn, melena, nausea and vomiting.  Genitourinary: Negative.   Musculoskeletal: Negative.   Skin: Negative.   Neurological: Positive for dizziness. Negative for tingling, tremors, sensory change, speech change, focal weakness, seizures, loss of consciousness, weakness and headaches.  Endo/Heme/Allergies: Negative.   Psychiatric/Behavioral: Negative.     All other ROS negative except what is listed above and in the HPI.      Objective:    BP 106/63   Temp (!) 97.4 F (36.3 C)   Wt 129 lb 8 oz (58.7 kg)   BMI 19.12 kg/m   Wt Readings from Last 3 Encounters:  10/25/19 129 lb 8 oz (58.7 kg)  10/20/19 134 lb 4.8 oz (60.9 kg)  10/06/19 137 lb 9.6 oz (62.4 kg)    Physical Exam Vitals and nursing note reviewed.  Constitutional:      General: He is not in acute distress.    Appearance: Normal appearance. He is not ill-appearing, toxic-appearing or diaphoretic.  HENT:     Head: Normocephalic and atraumatic.     Right Ear: Tympanic membrane, ear canal and  external ear normal. There is no impacted cerumen.     Left Ear: Tympanic membrane, ear canal and external ear normal. There is no impacted cerumen.     Nose: Nose normal. No congestion or rhinorrhea.     Mouth/Throat:     Mouth: Mucous membranes are moist.     Pharynx: Oropharynx is clear. No oropharyngeal exudate or posterior oropharyngeal erythema.  Eyes:     General: No scleral icterus.       Right eye: No discharge.        Left eye: No discharge.     Extraocular Movements: Extraocular movements intact.     Conjunctiva/sclera: Conjunctivae normal.     Pupils: Pupils are equal, round, and reactive to light.  Neck:     Vascular: No carotid bruit.  Cardiovascular:     Rate and Rhythm: Normal rate and regular rhythm.     Pulses: Normal pulses.     Heart sounds: No murmur. No friction rub. No gallop.   Pulmonary:     Effort: Pulmonary effort is normal. No respiratory distress.     Breath sounds: Normal breath sounds. No stridor. No wheezing, rhonchi or rales.  Chest:     Chest wall: No tenderness.  Abdominal:     General: Abdomen is flat. Bowel sounds are normal. There is no distension.     Palpations: Abdomen is soft. There is no mass.     Tenderness: There is no abdominal tenderness. There is no right CVA tenderness, left CVA tenderness, guarding or rebound.     Hernia: No hernia is present.  Genitourinary:    Comments: Genital exam deferred with shared decision making Musculoskeletal:        General: No swelling, tenderness, deformity or signs of injury.     Cervical back: Normal range of motion and neck supple. No rigidity. No muscular  tenderness.     Right lower leg: No edema.     Left lower leg: No edema.  Lymphadenopathy:     Cervical: No cervical adenopathy.  Skin:    General: Skin is warm and dry.     Capillary Refill: Capillary refill takes less than 2 seconds.     Coloration: Skin is not jaundiced or pale.     Findings: No bruising, erythema, lesion or rash.    Neurological:     General: No focal deficit present.     Mental Status: He is alert and oriented to person, place, and time.     Cranial Nerves: No cranial nerve deficit.     Sensory: No sensory deficit.     Motor: No weakness.     Coordination: Coordination normal.     Gait: Gait normal.     Deep Tendon Reflexes: Reflexes normal.  Psychiatric:        Mood and Affect: Mood normal.        Behavior: Behavior normal.        Thought Content: Thought content normal.        Judgment: Judgment normal.     6CIT Screen 10/25/2019 07/03/2018 07/02/2017  What Year? 0 points 0 points 0 points  What month? 0 points 0 points 0 points  What time? 0 points 0 points 0 points  Count back from 20 0 points 0 points 0 points  Months in reverse 4 points 0 points 0 points  Repeat phrase 8 points 0 points 2 points  Total Score 12 0 2     Results for orders placed or performed in visit on 10/20/19  Comprehensive metabolic panel  Result Value Ref Range   Sodium 137 135 - 145 mmol/L   Potassium 3.5 3.5 - 5.1 mmol/L   Chloride 107 98 - 111 mmol/L   CO2 22 22 - 32 mmol/L   Glucose, Bld 95 70 - 99 mg/dL   BUN 13 8 - 23 mg/dL   Creatinine, Ser 1.14 0.61 - 1.24 mg/dL   Calcium 8.4 (L) 8.9 - 10.3 mg/dL   Total Protein 6.9 6.5 - 8.1 g/dL   Albumin 3.4 (L) 3.5 - 5.0 g/dL   AST 19 15 - 41 U/L   ALT 18 0 - 44 U/L   Alkaline Phosphatase 75 38 - 126 U/L   Total Bilirubin 0.6 0.3 - 1.2 mg/dL   GFR calc non Af Amer >60 >60 mL/min   GFR calc Af Amer >60 >60 mL/min   Anion gap 8 5 - 15  CBC with Differential  Result Value Ref Range   WBC 11.0 (H) 4.0 - 10.5 K/uL   RBC 2.86 (L) 4.22 - 5.81 MIL/uL   Hemoglobin 8.9 (L) 13.0 - 17.0 g/dL   HCT 29.3 (L) 39.0 - 52.0 %   MCV 102.4 (H) 80.0 - 100.0 fL   MCH 31.1 26.0 - 34.0 pg   MCHC 30.4 30.0 - 36.0 g/dL   RDW 18.6 (H) 11.5 - 15.5 %   Platelets 117 (L) 150 - 400 K/uL   nRBC 0.0 0.0 - 0.2 %   Neutrophils Relative % 84 %   Neutro Abs 9.2 (H) 1.7 - 7.7 K/uL    Lymphocytes Relative 6 %   Lymphs Abs 0.7 0.7 - 4.0 K/uL   Monocytes Relative 8 %   Monocytes Absolute 0.9 0.1 - 1.0 K/uL   Eosinophils Relative 1 %   Eosinophils Absolute 0.1 0.0 - 0.5 K/uL  Basophils Relative 0 %   Basophils Absolute 0.0 0.0 - 0.1 K/uL   WBC Morphology DIFF CONFIRMED. 18% BANDS NOTED ON SMEAR    RBC Morphology MORPHOLOGY UNREMARKABLE    Smear Review MORPHOLOGY UNREMARKABLE    Immature Granulocytes 1 %   Abs Immature Granulocytes 0.10 (H) 0.00 - 0.07 K/uL      Assessment & Plan:   Problem List Items Addressed This Visit      Cardiovascular and Mediastinum   CAD (coronary artery disease)    Feeling well. Continue to follow with cardiology. Call with any concerns. Continue to monitor.       Relevant Medications   lisinopril (ZESTRIL) 2.5 MG tablet   isosorbide mononitrate (IMDUR) 30 MG 24 hr tablet   atorvastatin (LIPITOR) 40 MG tablet   Other Relevant Orders   Comprehensive metabolic panel   Senile purpura ([place])    Reassured patient. Call with any concerns. Continue to monitor.       Relevant Medications   lisinopril (ZESTRIL) 2.5 MG tablet   isosorbide mononitrate (IMDUR) 30 MG 24 hr tablet   atorvastatin (LIPITOR) 40 MG tablet   Other Relevant Orders   Comprehensive metabolic panel   Atrial fibrillation ([facility])    HR under good control. Continue to follow with cardiology. Call with any concerns. Continue to monitor.       Relevant Medications   lisinopril (ZESTRIL) 2.5 MG tablet   isosorbide mononitrate (IMDUR) 30 MG 24 hr tablet   atorvastatin (LIPITOR) 40 MG tablet   Other Relevant Orders   Comprehensive metabolic panel   TSH     Digestive   Acid reflux   Relevant Medications   pantoprazole (PROTONIX) 20 MG tablet   Esophageal cancer ([facility])    Continue to follow with oncology. Continue to monitor. Call with any concerns.       Relevant Medications   clopidogrel (PLAVIX) 75 MG tablet     Endocrine   Hypothyroid    Will recheck  levels today. Adjust dose as needed. Call with any concerns.       Relevant Orders   TSH     Genitourinary   BPH (benign prostatic hyperplasia)    Under good control on current regimen. Continue current regimen. Continue to monitor. Call with any concerns. Refills given. Labs drawn today.       Relevant Orders   Comprehensive metabolic panel   PSA   UA/M w/rflx Culture, Routine   Hypertensive kidney disease with CKD stage III    Over treated with weight loss. Will decrease his lisinopril to 2.5mg  and recheck 3 months. Call with any concerns. Continue to monitor.       Relevant Orders   Comprehensive metabolic panel   Microalbumin, Urine Waived     Other   Hyperlipidemia    Under good control on current regimen. Continue current regimen. Continue to monitor. Call with any concerns. Refills given. Labs drawn today.      Relevant Medications   lisinopril (ZESTRIL) 2.5 MG tablet   isosorbide mononitrate (IMDUR) 30 MG 24 hr tablet   atorvastatin (LIPITOR) 40 MG tablet   Other Relevant Orders   Comprehensive metabolic panel   Lipid Panel w/o Chol/HDL Ratio   Thrombocytopenia (H[facility])    Rechecking levels today. Await results. Call with any concerns.       Relevant Orders   CBC with Differential/Platelet   Comprehensive metabolic panel    Other Visit Diagnoses    Encounter for annual wellness exam  in Medicare patient    -  Primary   Preventative care discussed today as below.    Essential hypertension       Relevant Medications   lisinopril (ZESTRIL) 2.5 MG tablet   isosorbide mononitrate (IMDUR) 30 MG 24 hr tablet   atorvastatin (LIPITOR) 40 MG tablet       Preventative Services:  Health Risk Assessment and Personalized Prevention Plan: Done today Bone Mass Measurements: N/A CVD Screening: up to date Colon Cancer Screening: up to date Depression Screening: Done today Diabetes Screening: up to date Glaucoma Screening: See your eye doctor Hepatitis B vaccine:  N/A Hepatitis C screening: up to date HIV Screening: Up to date Flu Vaccine: up to date Lung cancer Screening: N/A Obesity Screening: Done today Pneumonia Vaccines (2): up to date STI Screening: N/A PSA screening: Done today  Discussed aspirin prophylaxis for myocardial infarction prevention and decision was made to continue ASA  LABORATORY TESTING:  Health maintenance labs ordered today as discussed above.   The natural history of prostate cancer and ongoing controversy regarding screening and potential treatment outcomes of prostate cancer has been discussed with the patient. The meaning of a false positive PSA and a false negative PSA has been discussed. He indicates understanding of the limitations of this screening test and wishes to proceed with screening PSA testing.   IMMUNIZATIONS:   - Tdap: Tetanus vaccination status reviewed: last tetanus booster within 10 years. - Influenza: Up to date - Pneumovax: Up to date - Prevnar: Up to date  SCREENING: - Colonoscopy: Up to date  Discussed with patient purpose of the colonoscopy is to detect colon cancer at curable precancerous or early stages   PATIENT COUNSELING:    Sexuality: Discussed sexually transmitted diseases, partner selection, use of condoms, avoidance of unintended pregnancy  and contraceptive alternatives.   Advised to avoid cigarette smoking.  I discussed with the patient that most people either abstain from alcohol or drink within safe limits (<=14/week and <=4 drinks/occasion for males, <=7/weeks and <= 3 drinks/occasion for females) and that the risk for alcohol disorders and other health effects rises proportionally with the number of drinks per week and how often a drinker exceeds daily limits.  Discussed cessation/primary prevention of drug use and availability of treatment for abuse.   Diet: Encouraged to adjust caloric intake to maintain  or achieve ideal body weight, to reduce intake of dietary saturated  fat and total fat, to limit sodium intake by avoiding high sodium foods and not adding table salt, and to maintain adequate dietary potassium and calcium preferably from fresh fruits, vegetables, and low-fat dairy products.    stressed the importance of regular exercise  Injury prevention: Discussed safety belts, safety helmets, smoke detector, smoking near bedding or upholstery.   Dental health: Discussed importance of regular tooth brushing, flossing, and dental visits.   Follow up plan: NEXT PREVENTATIVE PHYSICAL DUE IN 1 YEAR. Return in about 3 months (around 01/22/2020) for Follow up BP.

## 2019-10-25 NOTE — Assessment & Plan Note (Signed)
HR under good control. Continue to follow with cardiology. Call with any concerns. Continue to monitor.

## 2019-10-25 NOTE — Assessment & Plan Note (Signed)
Continue to follow with oncology. Continue to monitor. Call with any concerns.

## 2019-10-25 NOTE — Telephone Encounter (Signed)
Wife called and states medicine for diarrhea was to have been called in for patient, but [name] in [place] states that they do not have a prescription from [place]. Prescription was ordered 2/3, but it printed and did not get sent to pharmacy

## 2019-10-25 NOTE — Assessment & Plan Note (Signed)
Rechecking levels today. Await results. Call with any concerns.  

## 2019-10-25 NOTE — Telephone Encounter (Signed)
Thanks Hassan Rowan. I don't know why I always forget that it's controlled.

## 2019-10-25 NOTE — Assessment & Plan Note (Signed)
Under good control on current regimen. Continue current regimen. Continue to monitor. Call with any concerns. Refills given. Labs drawn today.   

## 2019-10-26 LAB — COMPREHENSIVE METABOLIC PANEL
ALT: 10 IU/L (ref 0–44)
AST: 19 IU/L (ref 0–40)
Albumin/Globulin Ratio: 1.3 (ref 1.2–2.2)
Albumin: 3.5 g/dL — ABNORMAL LOW (ref 3.7–4.7)
Alkaline Phosphatase: 147 IU/L — ABNORMAL HIGH (ref 39–117)
BUN/Creatinine Ratio: 17 (ref 10–24)
BUN: 22 mg/dL (ref 8–27)
Bilirubin Total: 0.5 mg/dL (ref 0.0–1.2)
CO2: 22 mmol/L (ref 20–29)
Calcium: 8.6 mg/dL (ref 8.6–10.2)
Chloride: 106 mmol/L (ref 96–106)
Creatinine, Ser: 1.29 mg/dL — ABNORMAL HIGH (ref 0.76–1.27)
GFR calc Af Amer: 63 mL/min/{1.73_m2} (ref >59)
GFR calc non Af Amer: 55 mL/min/{1.73_m2} — ABNORMAL LOW (ref >59)
Globulin, Total: 2.8 g/dL (ref 1.5–4.5)
Glucose: 78 mg/dL (ref 65–99)
Potassium: 3.5 mmol/L (ref 3.5–5.2)
Sodium: 143 mmol/L (ref 134–144)
Total Protein: 6.3 g/dL (ref 6.0–8.5)

## 2019-10-26 LAB — CBC WITH DIFFERENTIAL/PLATELET
Basophils Absolute: 0.1 10*3/uL (ref 0.0–0.2)
Basos: 0 %
EOS (ABSOLUTE): 0 10*3/uL (ref 0.0–0.4)
Eos: 0 %
Hematocrit: 29.4 % — ABNORMAL LOW (ref 37.5–51.0)
Hemoglobin: 9.8 g/dL — ABNORMAL LOW (ref 13.0–17.7)
Immature Grans (Abs): 1.6 10*3/uL — ABNORMAL HIGH (ref 0.0–0.1)
Immature Granulocytes: 5 %
Lymphocytes Absolute: 0.6 10*3/uL — ABNORMAL LOW (ref 0.7–3.1)
Lymphs: 2 %
MCH: 33.2 pg — ABNORMAL HIGH (ref 26.6–33.0)
MCHC: 33.3 g/dL (ref 31.5–35.7)
MCV: 100 fL — ABNORMAL HIGH (ref 79–97)
Monocytes Absolute: 0.3 10*3/uL (ref 0.1–0.9)
Monocytes: 1 %
Neutrophils Absolute: 27.3 10*3/uL — ABNORMAL HIGH (ref 1.4–7.0)
Neutrophils: 92 %
Platelets: 172 10*3/uL (ref 150–450)
RBC: 2.95 x10E6/uL — ABNORMAL LOW (ref 4.14–5.80)
RDW: 17.1 % — ABNORMAL HIGH (ref 11.6–15.4)
WBC: 29.8 10*3/uL (ref 3.4–10.8)

## 2019-10-26 LAB — LIPID PANEL W/O CHOL/HDL RATIO
Cholesterol, Total: 74 mg/dL — ABNORMAL LOW (ref 100–199)
HDL: 38 mg/dL — ABNORMAL LOW (ref >39)
LDL Chol Calc (NIH): 20 mg/dL (ref 0–99)
Triglycerides: 76 mg/dL (ref 0–149)
VLDL Cholesterol Cal: 16 mg/dL (ref 5–40)

## 2019-10-26 LAB — TSH: TSH: 4.07 u[IU]/mL (ref 0.450–4.500)

## 2019-10-26 LAB — PSA: Prostate Specific Ag, Serum: 0.5 ng/mL (ref 0.0–4.0)

## 2019-10-27 ENCOUNTER — Telehealth: Payer: Self-pay

## 2019-10-27 NOTE — Telephone Encounter (Signed)
Telephone call to schedule palliative care visit.  Patient in agreement with palliative care team making home visit on 11/03/19 at 1:00 PM.

## 2019-10-30 NOTE — Progress Notes (Signed)
[place]  Telephone:(336) [phone] Fax:(336) [phone]  ID: [name] OB: 05/18/1946  MR#: [id number]  DJM#:[id number]  Patient Care Team: [name], MD as PCP - General (Family Medicine) [name], MD as PCP - Family Medicine (Family Medicine) [name], MD as Consulting Physician (Oncology) [name], MD as Referring Physician [name], MD as Consulting Physician (Cardiology)  CHIEF COMPLAINT: Recurrent adenocarcinoma of the lower third of the esophagus.  INTERVAL HISTORY: Patient returns to clinic today for further evaluation and consideration of cycle 5 of palliative FOLFOX.  He is continuing to have diarrhea, but this is better controlled with Lomotil.  He continues to have chronic weakness and fatigue. He does not complain of dysphagia or difficulty swallowing today.  His appetite has improved and he is gaining weight.  He has no neurologic complaints.  He denies any recent fevers or illnesses.  He denies any chest pain, shortness of breath, cough, or hemoptysis.  He denies any nausea, vomiting, or constipation. He has no melena or hematochezia.  He has no urinary complaints.  Patient offers no further specific complaints today.  REVIEW OF SYSTEMS:   Review of Systems  Constitutional: Positive for malaise/fatigue. Negative for fever and weight loss.  HENT: Negative.   Respiratory: Negative.  Negative for cough and shortness of breath.   Cardiovascular: Negative.  Negative for chest pain and leg swelling.  Gastrointestinal: Positive for diarrhea. Negative for abdominal pain, blood in stool, constipation, melena, nausea and vomiting.  Genitourinary: Negative.  Negative for frequency and urgency.  Musculoskeletal: Negative.  Negative for back pain.  Skin: Negative.  Negative for rash.  Neurological: Positive for weakness. Negative for tingling, focal weakness and headaches.  Psychiatric/Behavioral: Negative.  The patient  is not nervous/anxious and does not have insomnia.     As per HPI. Otherwise, a complete review of systems is negative.  PAST MEDICAL HISTORY: Past Medical History:  Diagnosis Date  . Abnormal white blood cell (WBC) count    seeing Dr. [name] at [place] on 10/25  . Atrial fibrillation ([place])   . BPH (benign prostatic hypertrophy)   . Bradycardia   . CAD (coronary artery disease)   . Dysrhythmia    bradycardia - sees Dr. Humphrey Rolls  . Esophageal cancer ([place]) 05/2016  . GERD (gastroesophageal reflux disease)   . History of kidney stones   . Hyperlipidemia   . Hypertension   . Hypothyroidism   . Kidney stone   . Myocardial infarction ([place]) 2005  . S/P angioplasty with stent 2005   x 4 vessels  . Sleep apnea    mild-NO CPAP  . Spinal stenosis    lumbar    PAST SURGICAL HISTORY: Past Surgical History:  Procedure Laterality Date  . BACK SURGERY    . CARDIAC CATHETERIZATION Left 03/12/2016   Procedure: Left Heart Cath and Coronary Angiography;  Surgeon: [name], MD;  Location: [facility];  Service: Cardiovascular;  Laterality: Left;  . CARDIAC CATHETERIZATION N/A 03/12/2016   Procedure: Coronary Stent Intervention;  Surgeon: Yolonda Kida, MD;  Location: [facility];  Service: Cardiovascular;  Laterality: N/A;  . COLONOSCOPY WITH PROPOFOL N/A 07/13/2015   Procedure: COLONOSCOPY WITH PROPOFOL;  Surgeon: Lucilla Lame, MD;  Location: [facility];  Service: Endoscopy;  Laterality: N/A;  . CORONARY ANGIOPLASTY  10/05 and 12/05   4 DES placed  . CYSTOSCOPY W/ RETROGRADES Left 06/15/2019   Procedure: CYSTOSCOPY WITH RETROGRADE PYELOGRAM;  Surgeon: Abbie Sons, MD;  Location: [facility];  Service: Urology;  Laterality: Left;  . CYSTOSCOPY/URETEROSCOPY/HOLMIUM LASER/STENT PLACEMENT Left 06/15/2019   Procedure: CYSTOSCOPY/URETEROSCOPY/HOLMIUM LASER/STENT PLACEMENT;  Surgeon: Abbie Sons, MD;  Location: [facility];  Service: Urology;  Laterality: Left;    . ESOPHAGECTOMY  10/07/2016   @ [facility]  . ESOPHAGOGASTRODUODENOSCOPY (EGD) WITH PROPOFOL N/A 06/14/2016   Procedure: ESOPHAGOGASTRODUODENOSCOPY (EGD) WITH PROPOFOL;  Surgeon: [name], MD;  Location: [facility];  Service: Endoscopy;  Laterality: N/A;  . HERNIA REPAIR Bilateral    x3  . INGUINAL HERNIA REPAIR Right 08/28/2017   Large PerFix plug, recurrent hernia;  Surgeon: Robert Bellow, MD;  Location: [facility];  Service: General;  Laterality: Right;  recurrent  . KIDNEY STONE SURGERY  07/15/2019  . POLYPECTOMY  07/13/2015   Procedure: POLYPECTOMY;  Surgeon: Lucilla Lame, MD;  Location: [facility];  Service: Endoscopy;;  . PORT-A-CATH REMOVAL N/A 01/16/2017   Procedure: REMOVAL PORT-A-CATH;  Surgeon: Olean Ree, MD;  Location: [facility];  Service: General;  Laterality: N/A;  . PORTACATH PLACEMENT N/A 07/08/2016   Procedure: INSERTION PORT-A-CATH;  Surgeon: Olean Ree, MD;  Location: [facility];  Service: General;  Laterality: N/A;  . PORTACATH PLACEMENT Right 08/30/2019   Procedure: INSERTION PORT-A-CATH;  Surgeon: Olean Ree, MD;  Location: [facility];  Service: General;  Laterality: Right;    FAMILY HISTORY Family History  Problem Relation Age of Onset  . Hypertension Mother   . Dementia Mother   . Heart disease Father   . Hypertension Father   . Kidney disease Father   . Hypertension Brother   . Heart disease Brother   . Diabetes Son   . Hypertension Son   . Hyperlipidemia Son   . Hyperlipidemia Daughter   . Hypertension Daughter        ADVANCED DIRECTIVES:    HEALTH MAINTENANCE: Social History   Tobacco Use  . Smoking status: Former Smoker    Packs/day: 1.00    Years: 40.00    Pack years: 40.00    Types: Cigarettes    Quit date: 07/16/2004    Years since quitting: 15.2  . Smokeless tobacco: Former Systems developer    Types: Chew  Substance Use Topics  . Alcohol use: Yes    Alcohol/week: 1.0 standard drinks    Types: 1 Cans of beer per week  .  Drug use: No     Colonoscopy:  PAP:  Bone density:  Lipid panel:  No Known Allergies  Current Outpatient Medications  Medication Sig Dispense Refill  . acetaminophen (TYLENOL) 500 MG tablet Take 500 mg by mouth every 6 (six) hours as needed for mild pain or moderate pain.    Marland Kitchen aspirin 81 MG tablet Take 81 mg by mouth daily.     Marland Kitchen atorvastatin (LIPITOR) 40 MG tablet Take 1 tablet (40 mg total) by mouth daily. 90 tablet 1  . clopidogrel (PLAVIX) 75 MG tablet Take 1 tablet (75 mg total) by mouth daily. 90 tablet 2  . diphenoxylate-atropine (LOMOTIL) 2.5-0.025 MG tablet Take 2 tablets by mouth 4 (four) times daily as needed for diarrhea or loose stools. 30 tablet 0  . isosorbide mononitrate (IMDUR) 30 MG 24 hr tablet Take 1 tablet (30 mg total) by mouth every evening. 90 tablet 1  . levothyroxine (SYNTHROID) 50 MCG tablet Take 1 tablet (50 mcg total) by mouth daily. 90 tablet 1  . lisinopril (ZESTRIL) 2.5 MG tablet Take 1 tablet (2.5 mg total) by mouth  every morning. 90 tablet 1  . loperamide (IMODIUM A-D) 2 MG tablet Take 2 mg by mouth 4 (four) times daily as needed for diarrhea or loose stools.    . metoprolol succinate (TOPROL-XL) 25 MG 24 hr tablet Take 25 mg by mouth daily.    . multivitamin-iron-minerals-folic acid (CENTRUM) chewable tablet Chew 1 tablet by mouth daily.    . nitroGLYCERIN (NITROSTAT) 0.4 MG SL tablet Place 1 tablet under the tongue every 5 (five) minutes as needed for chest pain.   98  . ondansetron (ZOFRAN) 8 MG tablet Take 1 tablet (8 mg total) by mouth 2 (two) times daily as needed for refractory nausea / vomiting. Start on day 3 after chemotherapy. 60 tablet 2  . pantoprazole (PROTONIX) 20 MG tablet Take 1 tablet (20 mg total) by mouth daily. 90 tablet 0  . prochlorperazine (COMPAZINE) 10 MG tablet Take 1 tablet (10 mg total) by mouth every 6 (six) hours as needed (Nausea or vomiting). 60 tablet 2   No current facility-administered medications for this visit.     OBJECTIVE: There were no vitals filed for this visit.   There is no height or weight on file to calculate BMI.    ECOG FS:0 - Asymptomatic  General: Thin, no acute distress. Eyes: Pink conjunctiva, anicteric sclera. HEENT: Normocephalic, moist mucous membranes. Lungs: No audible wheezing or coughing. Heart: Regular rate and rhythm. Abdomen: Soft, nontender, no obvious distention. Musculoskeletal: No edema, cyanosis, or clubbing. Neuro: Alert, answering all questions appropriately. Cranial nerves grossly intact. Skin: No rashes or petechiae noted. Psych: Normal affect.  LAB RESULTS:  Lab Results  Component Value Date   NA 143 10/25/2019   K 3.5 10/25/2019   CL 106 10/25/2019   CO2 22 10/25/2019   GLUCOSE 78 10/25/2019   BUN 22 10/25/2019   CREATININE 1.29 (H) 10/25/2019   CALCIUM 8.6 10/25/2019   PROT 6.3 10/25/2019   ALBUMIN 3.5 (L) 10/25/2019   AST 19 10/25/2019   ALT 10 10/25/2019   ALKPHOS 147 (H) 10/25/2019   BILITOT 0.5 10/25/2019   GFRNONAA 55 (L) 10/25/2019   GFRAA 63 10/25/2019    Lab Results  Component Value Date   WBC 29.8 (HH) 10/25/2019   NEUTROABS 27.3 (H) 10/25/2019   HGB 9.8 (L) 10/25/2019   HCT 29.4 (L) 10/25/2019   MCV 100 (H) 10/25/2019   PLT 172 10/25/2019     STUDIES: No results found.  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  PLAN:    1.  Recurrent adenocarcinoma of the lower third of the esophagus: Patient initially completed 6 cycles of weekly carboplatinum and Taxol on August 21, 2016 and daily XRT completed on August 27, 2016. Patient had his esophagectomy on October 07, 2016.  His most recent imaging at [facility] suggested recurrence which was confirmed with biopsy.  Per recommendation of [facility], will proceed with palliative FOLFOX.  Patient now has had port placement. Awaiting final pathology results to assess whether nivolumab will be of benefit.  Patient wishes to delay cycle 5  given the impending winter storm, therefore return to clinic in 1 week for reconsideration of treatment.   2.  Diarrhea: Improved with Lomotil. 3.  Thrombocytopenia: Resolved. 4.  Renal insufficiency: Resolved. 5.  Anemia: Patient's hemoglobin has trended down and is now 8.7. 6.  Leukocytosis: Secondary to Udenyca.   Patient expressed understanding and was in agreement with this plan. He also understands that He can call clinic at any  time with any questions, concerns, or complaints.    [name], MD 10/30/19 10:34 AM

## 2019-11-03 ENCOUNTER — Encounter: Payer: Self-pay | Admitting: Oncology

## 2019-11-03 ENCOUNTER — Other Ambulatory Visit: Payer: Self-pay

## 2019-11-03 ENCOUNTER — Inpatient Hospital Stay: Payer: Medicare Other

## 2019-11-03 ENCOUNTER — Other Ambulatory Visit: Payer: Self-pay | Admitting: Emergency Medicine

## 2019-11-03 ENCOUNTER — Inpatient Hospital Stay (HOSPITAL_BASED_OUTPATIENT_CLINIC_OR_DEPARTMENT_OTHER): Payer: Medicare Other | Admitting: Oncology

## 2019-11-03 VITALS — BP 96/75 | HR 72 | Temp 95.8°F | Wt 133.7 lb

## 2019-11-03 DIAGNOSIS — N289 Disorder of kidney and ureter, unspecified: Secondary | ICD-10-CM | POA: Diagnosis not present

## 2019-11-03 DIAGNOSIS — C155 Malignant neoplasm of lower third of esophagus: Secondary | ICD-10-CM | POA: Diagnosis not present

## 2019-11-03 DIAGNOSIS — I2510 Atherosclerotic heart disease of native coronary artery without angina pectoris: Secondary | ICD-10-CM

## 2019-11-03 DIAGNOSIS — R531 Weakness: Secondary | ICD-10-CM | POA: Diagnosis not present

## 2019-11-03 DIAGNOSIS — Z5189 Encounter for other specified aftercare: Secondary | ICD-10-CM | POA: Diagnosis not present

## 2019-11-03 DIAGNOSIS — Z5111 Encounter for antineoplastic chemotherapy: Secondary | ICD-10-CM | POA: Diagnosis not present

## 2019-11-03 DIAGNOSIS — I2583 Coronary atherosclerosis due to lipid rich plaque: Secondary | ICD-10-CM

## 2019-11-03 DIAGNOSIS — R197 Diarrhea, unspecified: Secondary | ICD-10-CM | POA: Diagnosis not present

## 2019-11-03 LAB — CBC WITH DIFFERENTIAL/PLATELET
Abs Immature Granulocytes: 0.16 10*3/uL — ABNORMAL HIGH (ref 0.00–0.07)
Basophils Absolute: 0.1 10*3/uL (ref 0.0–0.1)
Basophils Relative: 0 %
Eosinophils Absolute: 0.1 10*3/uL (ref 0.0–0.5)
Eosinophils Relative: 0 %
HCT: 29.1 % — ABNORMAL LOW (ref 39.0–52.0)
Hemoglobin: 8.7 g/dL — ABNORMAL LOW (ref 13.0–17.0)
Immature Granulocytes: 1 %
Lymphocytes Relative: 5 %
Lymphs Abs: 0.7 10*3/uL (ref 0.7–4.0)
MCH: 31.4 pg (ref 26.0–34.0)
MCHC: 29.9 g/dL — ABNORMAL LOW (ref 30.0–36.0)
MCV: 105.1 fL — ABNORMAL HIGH (ref 80.0–100.0)
Monocytes Absolute: 1 10*3/uL (ref 0.1–1.0)
Monocytes Relative: 7 %
Neutro Abs: 12.3 10*3/uL — ABNORMAL HIGH (ref 1.7–7.7)
Neutrophils Relative %: 87 %
Platelets: 163 10*3/uL (ref 150–400)
RBC: 2.77 MIL/uL — ABNORMAL LOW (ref 4.22–5.81)
RDW: 20.2 % — ABNORMAL HIGH (ref 11.5–15.5)
WBC: 14.3 10*3/uL — ABNORMAL HIGH (ref 4.0–10.5)
nRBC: 0 % (ref 0.0–0.2)

## 2019-11-03 LAB — COMPREHENSIVE METABOLIC PANEL
ALT: 25 U/L (ref 0–44)
AST: 25 U/L (ref 15–41)
Albumin: 3.2 g/dL — ABNORMAL LOW (ref 3.5–5.0)
Alkaline Phosphatase: 85 U/L (ref 38–126)
Anion gap: 7 (ref 5–15)
BUN: 11 mg/dL (ref 8–23)
CO2: 22 mmol/L (ref 22–32)
Calcium: 8.4 mg/dL — ABNORMAL LOW (ref 8.9–10.3)
Chloride: 114 mmol/L — ABNORMAL HIGH (ref 98–111)
Creatinine, Ser: 1.15 mg/dL (ref 0.61–1.24)
GFR calc Af Amer: >60 mL/min (ref >60)
GFR calc non Af Amer: >60 mL/min (ref >60)
Glucose, Bld: 76 mg/dL (ref 70–99)
Potassium: 3.7 mmol/L (ref 3.5–5.1)
Sodium: 143 mmol/L (ref 135–145)
Total Bilirubin: 0.4 mg/dL (ref 0.3–1.2)
Total Protein: 6.4 g/dL — ABNORMAL LOW (ref 6.5–8.1)

## 2019-11-03 MED ORDER — HEPARIN SOD (PORK) LOCK FLUSH 100 UNIT/ML IV SOLN
500.0000 [IU] | Freq: Once | INTRAVENOUS | Status: AC
  Administered 2019-11-03: 09:00:00 500 [IU] via INTRAVENOUS
  Filled 2019-11-03: qty 5

## 2019-11-03 MED ORDER — DIPHENOXYLATE-ATROPINE 2.5-0.025 MG PO TABS: 2.0000 | ORAL_TABLET | Freq: Four times a day (QID) | ORAL | 0 refills | Status: DC | PRN

## 2019-11-03 MED ORDER — SODIUM CHLORIDE 0.9% FLUSH
10.0000 mL | INTRAVENOUS | Status: DC | PRN
  Administered 2019-11-03: 09:00:00 10 mL via INTRAVENOUS
  Filled 2019-11-03: qty 10

## 2019-11-03 NOTE — Progress Notes (Signed)
Per Dr. [name] no treatment at this time. Pt stable at discharge.

## 2019-11-03 NOTE — Progress Notes (Signed)
Pt here for follow up. No complaints. Only asking to have treatment pushed back due to impending weather and having to have pump off in two days.

## 2019-11-04 ENCOUNTER — Other Ambulatory Visit: Payer: Medicare Other

## 2019-11-04 DIAGNOSIS — Z515 Encounter for palliative care: Secondary | ICD-10-CM

## 2019-11-04 NOTE — Progress Notes (Signed)
COMMUNITY PALLIATIVE CARE SW NOTE  PATIENT NAME: Corey Perry DOB: 1946/02/26 MRN: [id number]  PRIMARY CARE PROVIDER: [name], MD  RESPONSIBLE PARTY:  Acct ID - Guarantor Home Phone Work Phone Relationship Acct Type  [id number] [name],* [phone]  Self P/F     [address], [place], [place] [address]     PLAN OF CARE and INTERVENTIONS:             1. GOALS OF CARE/ ADVANCE CARE PLANNING:  Patient is DNR. Patient has Living Will. Patient is reviewing MOST. Goal is for patient to "improve".  2. SOCIAL/EMOTIONAL/SPIRITUAL ASSESSMENT/ INTERVENTIONS:  SW and RN completed TELEHEALTH visit with patient and Corey Perry (patient's wife). Ethel provided brief history. Patient is doing "pretty good". Patient has a good appetite, but is having concerns with loose bowels. Patient is not resting well due to his position. Corey Perry said he has to have his head propped up and it is not always easy for him to be comfortable. Patient is retired, worked in a factory. Patient lives with his wife and small dog, "Baby girl". Patient has one adult daughter in [place]. Patient had a son that died in 12/16/2016. Patient has two grandchildren and two great grandchildren. SW provided education on palliative care, discussed goals of care, and used active and reflective listening. 3. PATIENT/CAREGIVER EDUCATION/ COPING:  Patient is alert, oriented but forgetful. No coping concerns. Family is supportive. 4. PERSONAL EMERGENCY PLAN:  Family will call 9-1-1 for emergencies. 5. COMMUNITY RESOURCES COORDINATION/ HEALTH CARE NAVIGATION:  Corey Perry helps manage care. Patient is scheduled for his next infusion on 2/24. Palliative care team scheduled home visit for 3/4.  6. FINANCIAL/LEGAL CONCERNS/INTERVENTIONS:  None.     SOCIAL HX:  Social History   Tobacco Use  . Smoking status: Former Smoker    Packs/day: 1.00    Years: 40.00    Pack years: 40.00    Types: Cigarettes    Quit date: 07/16/2004    Years since  quitting: 15.3  . Smokeless tobacco: Former Systems developer    Types: Chew  Substance Use Topics  . Alcohol use: Yes    Alcohol/week: 1.0 standard drinks    Types: 1 Cans of beer per week    CODE STATUS:   Code Status: Prior (DNR) ADVANCED DIRECTIVES: Y - requested copy. MOST FORM COMPLETE:  Discussing. HOSPICE EDUCATION PROVIDED: Yes.  PPS: Patient is mostly independent of ADLs.   Due to the COVID-19 , this visit was done via telephone from my office and it was initiated and consent by this patient and/or family. This was a scheduled visit.   I spent 30 minutes with patient/family, from 1:00-1:30p providing education, support and consultation.   Margaretmary Lombard, LCSW

## 2019-11-04 NOTE — Progress Notes (Signed)
[place]  Telephone:(336) [phone] Fax:(336) [phone]  ID: [name] OB: 02/18/1946  MR#: [id number]  FXT#:[id number]  Patient Care Team: [name], MD as PCP - General (Family Medicine) [name], MD as PCP - Family Medicine (Family Medicine) [name], MD as Consulting Physician (Oncology) [name], MD as Referring Physician [name], MD as Consulting Physician (Cardiology)  CHIEF COMPLAINT: Recurrent adenocarcinoma of the lower third of the esophagus.  INTERVAL HISTORY: Patient returns to clinic today for further evaluation and reconsideration of cycle 5 of palliative FOLFOX. He continues to have significant diarrhea, but states this is mildly improved over the past 1 to 2 days. He continues to have chronic weakness and fatigue. He does not complain of dysphagia or difficulty swallowing today.  His appetite has improved his weight has remained stable.  He has no neurologic complaints.  He denies any recent fevers or illnesses.  He denies any chest pain, shortness of breath, cough, or hemoptysis.  He denies any nausea, vomiting, or constipation. He has no melena or hematochezia.  He has no urinary complaints. Patient offers no further specific complaints today.  REVIEW OF SYSTEMS:   Review of Systems  Constitutional: Positive for malaise/fatigue. Negative for fever and weight loss.  HENT: Negative.   Respiratory: Negative.  Negative for cough and shortness of breath.   Cardiovascular: Negative.  Negative for chest pain and leg swelling.  Gastrointestinal: Positive for diarrhea. Negative for abdominal pain, blood in stool, constipation, melena, nausea and vomiting.  Genitourinary: Negative.  Negative for frequency and urgency.  Musculoskeletal: Negative.  Negative for back pain.  Skin: Negative.  Negative for rash.  Neurological: Positive for weakness. Negative for tingling, focal weakness and headaches.    Psychiatric/Behavioral: Negative.  The patient is not nervous/anxious and does not have insomnia.     As per HPI. Otherwise, a complete review of systems is negative.  PAST MEDICAL HISTORY: Past Medical History:  Diagnosis Date  . Abnormal white blood cell (WBC) count    seeing Dr. [name] at [place] on 10/25  . Atrial fibrillation ([place])   . BPH (benign prostatic hypertrophy)   . Bradycardia   . CAD (coronary artery disease)   . Dysrhythmia    bradycardia - sees Dr. Humphrey Rolls  . Esophageal cancer ([place]) 05/2016  . GERD (gastroesophageal reflux disease)   . History of kidney stones   . Hyperlipidemia   . Hypertension   . Hypothyroidism   . Kidney stone   . Myocardial infarction ([place]) 2005  . S/P angioplasty with stent 2005   x 4 vessels  . Sleep apnea    mild-NO CPAP  . Spinal stenosis    lumbar    PAST SURGICAL HISTORY: Past Surgical History:  Procedure Laterality Date  . BACK SURGERY    . CARDIAC CATHETERIZATION Left 03/12/2016   Procedure: Left Heart Cath and Coronary Angiography;  Surgeon: [name], MD;  Location: [facility];  Service: Cardiovascular;  Laterality: Left;  . CARDIAC CATHETERIZATION N/A 03/12/2016   Procedure: Coronary Stent Intervention;  Surgeon: Yolonda Kida, MD;  Location: [place] [facility];  Service: Cardiovascular;  Laterality: N/A;  . COLONOSCOPY WITH PROPOFOL N/A 07/13/2015   Procedure: COLONOSCOPY WITH PROPOFOL;  Surgeon: Lucilla Lame, MD;  Location: [facility];  Service: Endoscopy;  Laterality: N/A;  . CORONARY ANGIOPLASTY  10/05 and 12/05   4 DES placed  . CYSTOSCOPY W/ RETROGRADES Left 06/15/2019   Procedure:  CYSTOSCOPY WITH RETROGRADE PYELOGRAM;  Surgeon: Abbie Sons, MD;  Location: [facility];  Service: Urology;  Laterality: Left;  . CYSTOSCOPY/URETEROSCOPY/HOLMIUM LASER/STENT PLACEMENT Left 06/15/2019   Procedure: CYSTOSCOPY/URETEROSCOPY/HOLMIUM LASER/STENT PLACEMENT;  Surgeon: Abbie Sons, MD;  Location:  [facility];  Service: Urology;  Laterality: Left;  . ESOPHAGECTOMY  10/07/2016   @ [facility]  . ESOPHAGOGASTRODUODENOSCOPY (EGD) WITH PROPOFOL N/A 06/14/2016   Procedure: ESOPHAGOGASTRODUODENOSCOPY (EGD) WITH PROPOFOL;  Surgeon: [name], MD;  Location: [facility];  Service: Endoscopy;  Laterality: N/A;  . HERNIA REPAIR Bilateral    x3  . INGUINAL HERNIA REPAIR Right 08/28/2017   Large PerFix plug, recurrent hernia;  Surgeon: Robert Bellow, MD;  Location: [facility];  Service: General;  Laterality: Right;  recurrent  . KIDNEY STONE SURGERY  07/15/2019  . POLYPECTOMY  07/13/2015   Procedure: POLYPECTOMY;  Surgeon: Lucilla Lame, MD;  Location: [facility];  Service: Endoscopy;;  . PORT-A-CATH REMOVAL N/A 01/16/2017   Procedure: REMOVAL PORT-A-CATH;  Surgeon: Olean Ree, MD;  Location: [facility];  Service: General;  Laterality: N/A;  . PORTACATH PLACEMENT N/A 07/08/2016   Procedure: INSERTION PORT-A-CATH;  Surgeon: Olean Ree, MD;  Location: [facility];  Service: General;  Laterality: N/A;  . PORTACATH PLACEMENT Right 08/30/2019   Procedure: INSERTION PORT-A-CATH;  Surgeon: Olean Ree, MD;  Location: [facility];  Service: General;  Laterality: Right;    FAMILY HISTORY Family History  Problem Relation Age of Onset  . Hypertension Mother   . Dementia Mother   . Heart disease Father   . Hypertension Father   . Kidney disease Father   . Hypertension Brother   . Heart disease Brother   . Diabetes Son   . Hypertension Son   . Hyperlipidemia Son   . Hyperlipidemia Daughter   . Hypertension Daughter        ADVANCED DIRECTIVES:    HEALTH MAINTENANCE: Social History   Tobacco Use  . Smoking status: Former Smoker    Packs/day: 1.00    Years: 40.00    Pack years: 40.00    Types: Cigarettes    Quit date: 07/16/2004    Years since quitting: 15.3  . Smokeless tobacco: Former Systems developer    Types: Chew  Substance Use Topics  . Alcohol use: Yes    Alcohol/week: 1.0  standard drinks    Types: 1 Cans of beer per week  . Drug use: No     Colonoscopy:  PAP:  Bone density:  Lipid panel:  No Known Allergies  Current Outpatient Medications  Medication Sig Dispense Refill  . acetaminophen (TYLENOL) 500 MG tablet Take 500 mg by mouth every 6 (six) hours as needed for mild pain or moderate pain.    Marland Kitchen aspirin 81 MG tablet Take 81 mg by mouth daily.     Marland Kitchen atorvastatin (LIPITOR) 40 MG tablet Take 1 tablet (40 mg total) by mouth daily. 90 tablet 1  . clopidogrel (PLAVIX) 75 MG tablet Take 1 tablet (75 mg total) by mouth daily. 90 tablet 2  . diphenoxylate-atropine (LOMOTIL) 2.5-0.025 MG tablet Take 2 tablets by mouth 4 (four) times daily as needed for diarrhea or loose stools. 30 tablet 0  . isosorbide mononitrate (IMDUR) 30 MG 24 hr tablet Take 1 tablet (30 mg total) by mouth every evening. 90 tablet 1  . levothyroxine (SYNTHROID) 50 MCG tablet Take 1 tablet (50 mcg total) by mouth daily. 90 tablet 1  . lisinopril (ZESTRIL) 2.5 MG tablet Take 1 tablet (2.5  mg total) by mouth every morning. 90 tablet 1  . loperamide (IMODIUM A-D) 2 MG tablet Take 2 mg by mouth 4 (four) times daily as needed for diarrhea or loose stools.    . metoprolol succinate (TOPROL-XL) 25 MG 24 hr tablet Take 25 mg by mouth daily.    . multivitamin-iron-minerals-folic acid (CENTRUM) chewable tablet Chew 1 tablet by mouth daily.    . nitroGLYCERIN (NITROSTAT) 0.4 MG SL tablet Place 1 tablet under the tongue every 5 (five) minutes as needed for chest pain.   98  . ondansetron (ZOFRAN) 8 MG tablet Take 1 tablet (8 mg total) by mouth 2 (two) times daily as needed for refractory nausea / vomiting. Start on day 3 after chemotherapy. 60 tablet 2  . pantoprazole (PROTONIX) 20 MG tablet Take 1 tablet (20 mg total) by mouth daily. 90 tablet 0  . prochlorperazine (COMPAZINE) 10 MG tablet Take 1 tablet (10 mg total) by mouth every 6 (six) hours as needed (Nausea or vomiting). 60 tablet 2   No current  facility-administered medications for this visit.    OBJECTIVE: Vitals:   11/10/19 0856  BP: 123/75  Pulse: 88  Resp: 16  SpO2: 100%     Body mass index is 19.52 kg/m.    ECOG FS:0 - Asymptomatic  General: Thin, no acute distress. Eyes: Pink conjunctiva, anicteric sclera. HEENT: Normocephalic, moist mucous membranes. Lungs: No audible wheezing or coughing. Heart: Regular rate and rhythm. Abdomen: Soft, nontender, no obvious distention. Musculoskeletal: No edema, cyanosis, or clubbing. Neuro: Alert, answering all questions appropriately. Cranial nerves grossly intact. Skin: No rashes or petechiae noted. Psych: Normal affect.  LAB RESULTS:  Lab Results  Component Value Date   NA 138 11/10/2019   K 3.9 11/10/2019   CL 109 11/10/2019   CO2 22 11/10/2019   GLUCOSE 101 (H) 11/10/2019   BUN 13 11/10/2019   CREATININE 1.25 (H) 11/10/2019   CALCIUM 8.5 (L) 11/10/2019   PROT 6.9 11/10/2019   ALBUMIN 3.2 (L) 11/10/2019   AST 16 11/10/2019   ALT 15 11/10/2019   ALKPHOS 66 11/10/2019   BILITOT 0.5 11/10/2019   GFRNONAA 57 (L) 11/10/2019   GFRAA >60 11/10/2019    Lab Results  Component Value Date   WBC 10.7 (H) 11/10/2019   NEUTROABS 9.0 (H) 11/10/2019   HGB 9.5 (L) 11/10/2019   HCT 31.2 (L) 11/10/2019   MCV 105.8 (H) 11/10/2019   PLT 172 11/10/2019     STUDIES: No results found.  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  PLAN:    1.  Recurrent adenocarcinoma of the lower third of the esophagus: Patient initially completed 6 cycles of weekly carboplatinum and Taxol on August 21, 2016 and daily XRT completed on August 27, 2016. Patient had his esophagectomy on October 07, 2016.  His most recent imaging at [facility] suggested recurrence which was confirmed with biopsy.  Per recommendation of [facility], will proceed with palliative FOLFOX.  Awaiting final pathology results to assess whether nivolumab will be of benefit.  Proceed with cycle 5 of treatment today. Return to clinic in 2 days for pump removal, in 1 week for further evaluation and IV fluids and then in 2 weeks for further evaluation and consideration of cycle 6. Plan to reimage at the conclusion of cycle 6. 2.  Diarrhea: Continue Lomotil. Patient was also given octreotide today. 3.  Thrombocytopenia: Resolved. 4.  Renal insufficiency: Creatinine is trended up slightly. IV fluids next week  as above. 5.  Anemia: Chronic and unchanged. Patient's hemoglobin is 9.5. 6.  Leukocytosis: Secondary to Udenyca. Monitor.   Patient expressed understanding and was in agreement with this plan. He also understands that He can call clinic at any time with any questions, concerns, or complaints.    [name], MD 11/12/19 6:11 AM

## 2019-11-05 ENCOUNTER — Inpatient Hospital Stay: Payer: Medicare Other

## 2019-11-05 NOTE — Progress Notes (Signed)
PATIENT NAME: Corey Perry DOB: [birth year] MRN: [id number]  PRIMARY CARE PROVIDER: [name], MD  RESPONSIBLE PARTY:  Acct ID - Guarantor Home Phone Work Phone Relationship Acct Type  [id number] [name],* [phone]  Self P/F     [address], [place], [place] [address]    PLAN OF CARE and INTERVENTIONS:               1.  GOALS OF CARE/ ADVANCE CARE PLANNING: Patient and wife want patient to get better and be healthy.                 2.  PATIENT/CAREGIVER EDUCATION:  Education on s/s of infection, education on palliative care services, reviewed meds, support                3.  DISEASE STATUS: Due to the COVID-19 crisis, this visit was done via telephonic from my office and it was initiated and consent by this patient and or family. SW and RN completed telephonic visit with patient and his wife Meryl Crutch. Patient is a 71 year old patient of Dr. Gary Fleet with medical history of but not limited to esophageal cancer, acid reflux, CAD, Afib, hypothyroidism, BPH, hypertensive kidney disease stage 3, history of kidney stones, hyperlipidemia, leukopenia and thrombocytopenia. Wife reports patient has lost a significant amount of weight. Wife reports patients current weight is 133 lbs. Patient has a good appetite however after eating patient will have diarrhea and will have 3-4 bowel movements after eating. Patient will be receive 5th round of chemo next week (FLOFOX).  Patient also has been also goes home with an infusion pump after receiving chemo on Wednesday and returns to the cancer center on Friday to have pump removed. Patient has a non productive chronic cough and does have shortness of breath on exertion. Patient not currently on oxygen. Patients and wife's goal is patient will improve and be like he was before second cancer diagnosis. Patient denies having any nausea or vomiting other than he may have feel nauseated after receiving chemo. Patient has not suffered any recent falls.  Patient  is independent with ambulation and able to do his own ADL's.  Patient wife have two children and wife states their son passed away in 2016-12-20. Patient has no edema in his lower extremities. Patient has not been sleeping well at night. Patient has to sleep elevated or his food will "backup" per wife.  Patient had esophagectomy 09/2016. Patient sleeps on a wedge pillow in a regular bed. Patient has a living will and [place] of attorney. Patient states he does not want to be placed on a machine. SW discussed MOST / DNR form with patient and wife. Patient and wife request palliative care team visit in 2022/12/21 is patient will receive chemo next week. Patient wife in agreement with palliative care services and both were encouraged to contact palliative care with questions or concerns.     HISTORY OF PRESENT ILLNESS: Patient is a 74 year old male who resides in his home with wife.  Patient in agreement with palliative care services and will be seen monthly and PRN.   CODE STATUS: No Code  ADVANCED DIRECTIVES: Yes MOST FORM: No PPS: 60%   PHYSICAL EXAM:   VITALS: Today's Vitals   11/04/19 0750  Weight: 133 lb (60.3 kg)  Height: 5\' 9"  (1.753 m)  PainSc: 0-No pain    LUNGS: chronic non productive cough/ shortness of breath on exertion.  CARDIAC: patient has  history of Afib EXTREMITIES: denies having any edema in lower extremities SKIN: no open areas of skin breakdown  NEURO: Alert and oriented x 4       [name], RN

## 2019-11-07 ENCOUNTER — Other Ambulatory Visit: Payer: Self-pay | Admitting: Oncology

## 2019-11-07 DIAGNOSIS — C155 Malignant neoplasm of lower third of esophagus: Secondary | ICD-10-CM

## 2019-11-08 ENCOUNTER — Inpatient Hospital Stay: Payer: Medicare Other

## 2019-11-08 ENCOUNTER — Other Ambulatory Visit: Payer: Self-pay

## 2019-11-08 ENCOUNTER — Telehealth: Payer: Self-pay | Admitting: *Deleted

## 2019-11-08 ENCOUNTER — Inpatient Hospital Stay (HOSPITAL_BASED_OUTPATIENT_CLINIC_OR_DEPARTMENT_OTHER): Payer: Medicare Other | Admitting: Oncology

## 2019-11-08 VITALS — BP 118/81 | HR 69 | Temp 95.8°F | Resp 20 | Wt 131.9 lb

## 2019-11-08 DIAGNOSIS — Z5189 Encounter for other specified aftercare: Secondary | ICD-10-CM | POA: Diagnosis not present

## 2019-11-08 DIAGNOSIS — C155 Malignant neoplasm of lower third of esophagus: Secondary | ICD-10-CM | POA: Diagnosis not present

## 2019-11-08 DIAGNOSIS — I2583 Coronary atherosclerosis due to lipid rich plaque: Secondary | ICD-10-CM

## 2019-11-08 DIAGNOSIS — I2510 Atherosclerotic heart disease of native coronary artery without angina pectoris: Secondary | ICD-10-CM | POA: Diagnosis not present

## 2019-11-08 DIAGNOSIS — N289 Disorder of kidney and ureter, unspecified: Secondary | ICD-10-CM | POA: Diagnosis not present

## 2019-11-08 DIAGNOSIS — R197 Diarrhea, unspecified: Secondary | ICD-10-CM

## 2019-11-08 DIAGNOSIS — Z95828 Presence of other vascular implants and grafts: Secondary | ICD-10-CM

## 2019-11-08 DIAGNOSIS — R531 Weakness: Secondary | ICD-10-CM | POA: Diagnosis not present

## 2019-11-08 DIAGNOSIS — Z5111 Encounter for antineoplastic chemotherapy: Secondary | ICD-10-CM | POA: Diagnosis not present

## 2019-11-08 LAB — CBC WITH DIFFERENTIAL/PLATELET
Abs Immature Granulocytes: 0.1 10*3/uL — ABNORMAL HIGH (ref 0.00–0.07)
Basophils Absolute: 0.1 10*3/uL (ref 0.0–0.1)
Basophils Relative: 1 %
Eosinophils Absolute: 0.1 10*3/uL (ref 0.0–0.5)
Eosinophils Relative: 0 %
HCT: 30.4 % — ABNORMAL LOW (ref 39.0–52.0)
Hemoglobin: 9.2 g/dL — ABNORMAL LOW (ref 13.0–17.0)
Immature Granulocytes: 1 %
Lymphocytes Relative: 7 %
Lymphs Abs: 0.9 10*3/uL (ref 0.7–4.0)
MCH: 31.7 pg (ref 26.0–34.0)
MCHC: 30.3 g/dL (ref 30.0–36.0)
MCV: 104.8 fL — ABNORMAL HIGH (ref 80.0–100.0)
Monocytes Absolute: 1 10*3/uL (ref 0.1–1.0)
Monocytes Relative: 9 %
Neutro Abs: 9.8 10*3/uL — ABNORMAL HIGH (ref 1.7–7.7)
Neutrophils Relative %: 82 %
Platelets: 172 10*3/uL (ref 150–400)
RBC: 2.9 MIL/uL — ABNORMAL LOW (ref 4.22–5.81)
RDW: 21.2 % — ABNORMAL HIGH (ref 11.5–15.5)
WBC: 12 10*3/uL — ABNORMAL HIGH (ref 4.0–10.5)
nRBC: 0 % (ref 0.0–0.2)

## 2019-11-08 LAB — COMPREHENSIVE METABOLIC PANEL
ALT: 17 U/L (ref 0–44)
AST: 19 U/L (ref 15–41)
Albumin: 3.2 g/dL — ABNORMAL LOW (ref 3.5–5.0)
Alkaline Phosphatase: 71 U/L (ref 38–126)
Anion gap: 8 (ref 5–15)
BUN: 17 mg/dL (ref 8–23)
CO2: 23 mmol/L (ref 22–32)
Calcium: 8.4 mg/dL — ABNORMAL LOW (ref 8.9–10.3)
Chloride: 108 mmol/L (ref 98–111)
Creatinine, Ser: 1.19 mg/dL (ref 0.61–1.24)
GFR calc Af Amer: >60 mL/min (ref >60)
GFR calc non Af Amer: >60 mL/min (ref >60)
Glucose, Bld: 71 mg/dL (ref 70–99)
Potassium: 3.5 mmol/L (ref 3.5–5.1)
Sodium: 139 mmol/L (ref 135–145)
Total Bilirubin: 0.5 mg/dL (ref 0.3–1.2)
Total Protein: 6.7 g/dL (ref 6.5–8.1)

## 2019-11-08 LAB — MAGNESIUM: Magnesium: 2 mg/dL (ref 1.7–2.4)

## 2019-11-08 MED ORDER — SODIUM CHLORIDE 0.9 % IV SOLN
Freq: Once | INTRAVENOUS | Status: AC
  Filled 2019-11-08: qty 250

## 2019-11-08 MED ORDER — SODIUM CHLORIDE 0.9% FLUSH
10.0000 mL | Freq: Once | INTRAVENOUS | Status: AC
  Administered 2019-11-08: 13:00:00 10 mL via INTRAVENOUS
  Filled 2019-11-08: qty 10

## 2019-11-08 NOTE — Telephone Encounter (Signed)
Wife called reporting that patient is having "real bad diarrhea, every time he eats, it goes straight through him".  She is requesting a medicine stronger than the last one be called in for him. Please advise

## 2019-11-08 NOTE — Patient Instructions (Signed)
I am so sorry you are experiencing so much diarrhea.  Continue taking your Imodium and Lomotil as directed.  Please try and bring back a stool sample with you on Wednesday when you return to see Dr. [name].  Today while you are in clinic we gave you a liter of normal saline to try to rehydrate you after all of the diarrhea you have experienced.  We also drew labs. CBC    Component Value Date/Time   WBC 12.0 (H) 11/08/2019 1258   RBC 2.90 (L) 11/08/2019 1258   HGB 9.2 (L) 11/08/2019 1258   HGB 9.8 (L) 10/25/2019 0914   HCT 30.4 (L) 11/08/2019 1258   HCT 29.4 (L) 10/25/2019 0914   PLT 172 11/08/2019 1258   PLT 172 10/25/2019 0914   MCV 104.8 (H) 11/08/2019 1258   MCV 100 (H) 10/25/2019 0914   MCH 31.7 11/08/2019 1258   MCHC 30.3 11/08/2019 1258   RDW 21.2 (H) 11/08/2019 1258   RDW 17.1 (H) 10/25/2019 0914   LYMPHSABS 0.9 11/08/2019 1258   LYMPHSABS 0.6 (L) 10/25/2019 0914   MONOABS 1.0 11/08/2019 1258   EOSABS 0.1 11/08/2019 1258   EOSABS 0.0 10/25/2019 0914   BASOSABS 0.1 11/08/2019 1258   BASOSABS 0.1 10/25/2019 0914     Chemistry      Component Value Date/Time   NA 139 11/08/2019 1258   NA 143 10/25/2019 0914   K 3.5 11/08/2019 1258   CL 108 11/08/2019 1258   CO2 23 11/08/2019 1258   BUN 17 11/08/2019 1258   BUN 22 10/25/2019 0914   CREATININE 1.19 11/08/2019 1258      Component Value Date/Time   CALCIUM 8.4 (L) 11/08/2019 1258   ALKPHOS 71 11/08/2019 1258   AST 19 11/08/2019 1258   AST 31 03/15/2019 1022   ALT 17 11/08/2019 1258   ALT 28 03/15/2019 1022   BILITOT 0.5 11/08/2019 1258   BILITOT 0.5 10/25/2019 0914     Continue to stay hydrated and drink Pedialyte.  We also were able to give you some electrolyte packets that are helpful to replenish fluids and electrolytes you may have lost through your diarrhea.  Please call us if you have any questions or concerns.  Telephone number is [phone] option #3.   Faythe Casa, NP 11/08/2019 2:42 PM

## 2019-11-08 NOTE — Progress Notes (Signed)
Symptom Management Consult note [facility]  [phone] [phone]  Patient Care Team: [name], MD as PCP - General (Family Medicine) [name], MD as PCP - Family Medicine (Family Medicine) [name], MD as Consulting Physician (Oncology) [name], MD as Referring Physician [name], MD as Consulting Physician (Cardiology)   Name of the patient: Corey Perry  [id number]  1945/11/08   Date of visit: 11/08/2019   Diagnosis- Esophageal cancer   Chief complaint/ Reason for visit- Diarrhea  Heme/Onc history:  Oncology History  Esophageal cancer ([place])  06/23/2016 Initial Diagnosis   Esophageal cancer ([place])   09/01/2019 -  Chemotherapy   The patient had palonosetron (ALOXI) injection 0.25 mg, 0.25 mg, Intravenous,  Once, 5 of 6 cycles Administration: 0.25 mg (09/01/2019), 0.25 mg (09/22/2019), 0.25 mg (11/10/2019), 0.25 mg (10/06/2019), 0.25 mg (10/20/2019) pegfilgrastim-cbqv (UDENYCA) injection 6 mg, 6 mg, Subcutaneous, Once, 3 of 4 cycles Administration: 6 mg (10/08/2019), 6 mg (10/22/2019), 6 mg (11/12/2019) leucovorin 700 mg in dextrose 5 % 250 mL infusion, 704 mg, Intravenous,  Once, 5 of 6 cycles Administration: 700 mg (09/01/2019), 700 mg (11/10/2019), 700 mg (10/06/2019), 700 mg (10/20/2019) oxaliplatin (ELOXATIN) 150 mg in dextrose 5 % 500 mL chemo infusion, 85 mg/m2 = 150 mg, Intravenous,  Once, 5 of 6 cycles Administration: 150 mg (09/01/2019), 150 mg (09/22/2019), 150 mg (11/10/2019), 150 mg (10/06/2019), 150 mg (10/20/2019) fluorouracil (ADRUCIL) chemo injection 700 mg, 400 mg/m2 = 700 mg, Intravenous,  Once, 5 of 6 cycles Administration: 700 mg (09/01/2019), 700 mg (09/22/2019), 700 mg (11/10/2019), 700 mg (10/06/2019), 700 mg (10/20/2019) fluorouracil (ADRUCIL) 4,200 mg in sodium chloride 0.9 % 66 mL chemo infusion, 2,400 mg/m2 = 4,200 mg, Intravenous, 1 Day/Dose, 5 of 6 cycles Administration: 4,200 mg  (09/01/2019), 4,200 mg (09/22/2019), 4,200 mg (11/10/2019), 4,200 mg (10/06/2019), 4,200 mg (10/20/2019)  for chemotherapy treatment.     Interval history-patient presents to symptom management today for complaints of diarrhea.  He was evaluated by Dr. [name] on 11/03/2019 prior to cycle 5 of palliative FOLFOX.  He admitted to some diarrhea that was controlled with Lomotil.  He admitted to chronic weakness and fatigue with some dysphagia.   Today, he is having loose stools "more then usual". He has intermittent diarrhea since he   Denies any neurologic complaints. Denies recent fevers or illnesses. Denies any easy bleeding or bruising. Reports good appetite and denies weight loss. Denies chest pain. Denies any nausea, vomiting, constipation, or diarrhea. Denies urinary complaints. Patient offers no further specific complaints today.   ECOG FS:1 - Symptomatic but completely ambulatory  Review of systems- Review of Systems  Constitutional: Positive for malaise/fatigue and weight loss. Negative for chills and fever.  HENT: Negative for congestion, ear pain and tinnitus.   Eyes: Negative.  Negative for blurred vision and double vision.  Respiratory: Negative.  Negative for cough, sputum production and shortness of breath.   Cardiovascular: Negative.  Negative for chest pain, palpitations and leg swelling.  Gastrointestinal: Positive for diarrhea. Negative for abdominal pain, constipation, nausea and vomiting.  Genitourinary: Negative for dysuria, frequency and urgency.  Musculoskeletal: Negative for back pain and falls.  Skin: Negative.  Negative for rash.  Neurological: Positive for dizziness and weakness. Negative for headaches.  Endo/Heme/Allergies: Negative.  Does not bruise/bleed easily.  Psychiatric/Behavioral: Negative.  Negative for depression. The patient is not nervous/anxious and does not have insomnia.     Current treatment- s/p cycle 5 FOLFOX  No Known Allergies   Past Medical  History:  Diagnosis Date  . Abnormal white blood cell (WBC) count    seeing Dr. [name] at [facility] on 10/25  . Atrial fibrillation ([place])   . BPH (benign prostatic hypertrophy)   . Bradycardia   . CAD (coronary artery disease)   . Dysrhythmia    bradycardia - sees Dr. Humphrey Rolls  . Esophageal cancer ([place]) 05/2016  . GERD (gastroesophageal reflux disease)   . History of kidney stones   . Hyperlipidemia   . Hypertension   . Hypothyroidism   . Kidney stone   . Myocardial infarction ([place]) 2005  . S/P angioplasty with stent 2005   x 4 vessels  . Sleep apnea    mild-NO CPAP  . Spinal stenosis    lumbar     Past Surgical History:  Procedure Laterality Date  . BACK SURGERY    . CARDIAC CATHETERIZATION Left 03/12/2016   Procedure: Left Heart Cath and Coronary Angiography;  Surgeon: [name], MD;  Location: [facility];  Service: Cardiovascular;  Laterality: Left;  . CARDIAC CATHETERIZATION N/A 03/12/2016   Procedure: Coronary Stent Intervention;  Surgeon: Yolonda Kida, MD;  Location: [facility] [place] [facility];  Service: Cardiovascular;  Laterality: N/A;  . COLONOSCOPY WITH PROPOFOL N/A 07/13/2015   Procedure: COLONOSCOPY WITH PROPOFOL;  Surgeon: Lucilla Lame, MD;  Location: [facility];  Service: Endoscopy;  Laterality: N/A;  . CORONARY ANGIOPLASTY  10/05 and 12/05   4 DES placed  . CYSTOSCOPY W/ RETROGRADES Left 06/15/2019   Procedure: CYSTOSCOPY WITH RETROGRADE PYELOGRAM;  Surgeon: Abbie Sons, MD;  Location: [facility];  Service: Urology;  Laterality: Left;  . CYSTOSCOPY/URETEROSCOPY/HOLMIUM LASER/STENT PLACEMENT Left 06/15/2019   Procedure: CYSTOSCOPY/URETEROSCOPY/HOLMIUM LASER/STENT PLACEMENT;  Surgeon: Abbie Sons, MD;  Location: [facility];  Service: Urology;  Laterality: Left;  . ESOPHAGECTOMY  10/07/2016   @ [facility]  . ESOPHAGOGASTRODUODENOSCOPY (EGD) WITH PROPOFOL N/A 06/14/2016   Procedure: ESOPHAGOGASTRODUODENOSCOPY (EGD) WITH PROPOFOL;  Surgeon: [name], MD;  Location: [facility];  Service: Endoscopy;  Laterality: N/A;  . HERNIA REPAIR Bilateral    x3  . INGUINAL HERNIA REPAIR Right 08/28/2017   Large PerFix plug, recurrent hernia;  Surgeon: Robert Bellow, MD;  Location: [facility];  Service: General;  Laterality: Right;  recurrent  . KIDNEY STONE SURGERY  07/15/2019  . POLYPECTOMY  07/13/2015   Procedure: POLYPECTOMY;  Surgeon: Lucilla Lame, MD;  Location: [facility];  Service: Endoscopy;;  . PORT-A-CATH REMOVAL N/A 01/16/2017   Procedure: REMOVAL PORT-A-CATH;  Surgeon: Olean Ree, MD;  Location: [facility];  Service: General;  Laterality: N/A;  . PORTACATH PLACEMENT N/A 07/08/2016   Procedure: INSERTION PORT-A-CATH;  Surgeon: Olean Ree, MD;  Location: [facility];  Service: General;  Laterality: N/A;  . PORTACATH PLACEMENT Right 08/30/2019   Procedure: INSERTION PORT-A-CATH;  Surgeon: Olean Ree, MD;  Location: [facility];  Service: General;  Laterality: Right;    Social History   Socioeconomic History  . Marital status: Married    Spouse name: Not on file  . Number of children: Not on file  . Years of education: Not on file  . Highest education level: 9th grade  Occupational History  . Not on file  Tobacco Use  . Smoking status: Former Smoker    Packs/day: 1.00    Years: 40.00    Pack years: 40.00    Types: Cigarettes    Quit date: 07/16/2004  Years since quitting: 15.3  . Smokeless tobacco: Former Systems developer    Types: Chew  Substance and Sexual Activity  . Alcohol use: Yes    Alcohol/week: 1.0 standard drinks    Types: 1 Cans of beer per week  . Drug use: No  . Sexual activity: Yes    Birth control/protection: None  Other Topics Concern  . Not on file  Social History Narrative  . Not on file   Social Determinants of Health   Financial Resource Strain:   . Difficulty of Paying Living Expenses: Not on file  Food Insecurity:   . Worried About Charity fundraiser in the Last Year: Not on file    . Ran Out of Food in the Last Year: Not on file  Transportation Needs:   . Lack of Transportation (Medical): Not on file  . Lack of Transportation (Non-Medical): Not on file  Physical Activity:   . Days of Exercise per Week: Not on file  . Minutes of Exercise per Session: Not on file  Stress:   . Feeling of Stress : Not on file  Social Connections:   . Frequency of Communication with Friends and Family: Not on file  . Frequency of Social Gatherings with Friends and Family: Not on file  . Attends Religious Services: Not on file  . Active Member of Clubs or Organizations: Not on file  . Attends Archivist Meetings: Not on file  . Marital Status: Not on file  Intimate Partner Violence:   . Fear of Current or Ex-Partner: Not on file  . Emotionally Abused: Not on file  . Physically Abused: Not on file  . Sexually Abused: Not on file    Family History  Problem Relation Age of Onset  . Hypertension Mother   . Dementia Mother   . Heart disease Father   . Hypertension Father   . Kidney disease Father   . Hypertension Brother   . Heart disease Brother   . Diabetes Son   . Hypertension Son   . Hyperlipidemia Son   . Hyperlipidemia Daughter   . Hypertension Daughter      Current Outpatient Medications:  .  acetaminophen (TYLENOL) 500 MG tablet, Take 500 mg by mouth every 6 (six) hours as needed for mild pain or moderate pain., Disp: , Rfl:  .  aspirin 81 MG tablet, Take 81 mg by mouth daily. , Disp: , Rfl:  .  atorvastatin (LIPITOR) 40 MG tablet, Take 1 tablet (40 mg total) by mouth daily., Disp: 90 tablet, Rfl: 1 .  clopidogrel (PLAVIX) 75 MG tablet, Take 1 tablet (75 mg total) by mouth daily., Disp: 90 tablet, Rfl: 2 .  diphenoxylate-atropine (LOMOTIL) 2.5-0.025 MG tablet, Take 2 tablets by mouth 4 (four) times daily as needed for diarrhea or loose stools., Disp: 30 tablet, Rfl: 0 .  isosorbide mononitrate (IMDUR) 30 MG 24 hr tablet, Take 1 tablet (30 mg total) by  mouth every evening., Disp: 90 tablet, Rfl: 1 .  levothyroxine (SYNTHROID) 50 MCG tablet, Take 1 tablet (50 mcg total) by mouth daily., Disp: 90 tablet, Rfl: 1 .  lisinopril (ZESTRIL) 2.5 MG tablet, Take 1 tablet (2.5 mg total) by mouth every morning., Disp: 90 tablet, Rfl: 1 .  loperamide (IMODIUM A-D) 2 MG tablet, Take 2 mg by mouth 4 (four) times daily as needed for diarrhea or loose stools., Disp: , Rfl:  .  metoprolol succinate (TOPROL-XL) 25 MG 24 hr tablet, Take 25 mg by mouth  daily., Disp: , Rfl:  .  multivitamin-iron-minerals-folic acid (CENTRUM) chewable tablet, Chew 1 tablet by mouth daily., Disp: , Rfl:  .  nitroGLYCERIN (NITROSTAT) 0.4 MG SL tablet, Place 1 tablet under the tongue every 5 (five) minutes as needed for chest pain. , Disp: , Rfl: 98 .  ondansetron (ZOFRAN) 8 MG tablet, Take 1 tablet (8 mg total) by mouth 2 (two) times daily as needed for refractory nausea / vomiting. Start on day 3 after chemotherapy., Disp: 60 tablet, Rfl: 2 .  pantoprazole (PROTONIX) 20 MG tablet, Take 1 tablet (20 mg total) by mouth daily., Disp: 90 tablet, Rfl: 0 .  prochlorperazine (COMPAZINE) 10 MG tablet, Take 1 tablet (10 mg total) by mouth every 6 (six) hours as needed (Nausea or vomiting)., Disp: 60 tablet, Rfl: 2  Physical exam:  Vitals:   11/08/19 1318  BP: 118/81  Pulse: 69  Resp: 20  Temp: (!) 95.8 F (35.4 C)  TempSrc: Tympanic  SpO2: 100%  Weight: 131 lb 14.4 oz (59.8 kg)   Physical Exam Constitutional:      Appearance: Normal appearance.  HENT:     Head: Normocephalic and atraumatic.  Eyes:     Pupils: Pupils are equal, round, and reactive to light.  Cardiovascular:     Rate and Rhythm: Normal rate and regular rhythm.     Heart sounds: Normal heart sounds. No murmur.  Pulmonary:     Effort: Pulmonary effort is normal.     Breath sounds: Normal breath sounds. No wheezing.  Abdominal:     General: Bowel sounds are normal. There is no distension.     Palpations: Abdomen  is soft.     Tenderness: There is no abdominal tenderness.  Musculoskeletal:        General: Normal range of motion.     Cervical back: Normal range of motion.  Skin:    General: Skin is warm and dry.     Coloration: Skin is pale.     Findings: No rash.  Neurological:     Mental Status: He is alert and oriented to person, place, and time.  Psychiatric:        Judgment: Judgment normal.      CMP Latest Ref Rng & Units 11/10/2019  Glucose 70 - 99 mg/dL 101(H)  BUN 8 - 23 mg/dL 13  Creatinine 0.61 - 1.24 mg/dL 1.25(H)  Sodium 135 - 145 mmol/L 138  Potassium 3.5 - 5.1 mmol/L 3.9  Chloride 98 - 111 mmol/L 109  CO2 22 - 32 mmol/L 22  Calcium 8.9 - 10.3 mg/dL 8.5(L)  Total Protein 6.5 - 8.1 g/dL 6.9  Total Bilirubin 0.3 - 1.2 mg/dL 0.5  Alkaline Phos 38 - 126 U/L 66  AST 15 - 41 U/L 16  ALT 0 - 44 U/L 15   CBC Latest Ref Rng & Units 11/10/2019  WBC 4.0 - 10.5 K/uL 10.7(H)  Hemoglobin 13.0 - 17.0 g/dL 9.5(L)  Hematocrit 39.0 - 52.0 % 31.2(L)  Platelets 150 - 400 K/uL 172    No images are attached to the encounter.  No results found.   Assessment and plan- Patient is a 74 y.o. male who presents to symptom management with increased episodes of diarrhea.  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  He has chronic diarrhea and weakness and fatigue secondary to treatment.  He previously has completed 6 cycles of carbo/Taxol in December 2017 with daily radiation.  Had esophagectomy  in January 2018.  Imaging from [facility] showed recurrence which was confirmed with biopsy.  [facility] recommended palliative FOLFOX.  He is currently received 5 cycles plan is for 6 cycles and repeat imaging.  Still awaiting pathology to see whether the nivolumab will be beneficial.  He has been seen often for IV fluids.    Plan: Labs: Neutropenia: secondary to on pro-Neulasta Anemia: Secondary to treatment Elevated creatinine: 1.25.  Patient  will receive IV fluids in clinic today. Stool studies. Negative Hold imodium until negative for infection Will consider octreotide if diarrhea continues.   Disposition: RTC as scheduled on 11/10/19 to see Dr. [name], labs and FOLFOX.    Visit Diagnosis 1. Diarrhea, unspecified type     Patient expressed understanding and was in agreement with this plan. He also understands that He can call clinic at any time with any questions, concerns, or complaints.   Greater than 50% was spent in counseling and coordination of care with this patient including but not limited to discussion of the relevant topics above (See A&P) including, but not limited to diagnosis and management of acute and chronic medical conditions.   Thank you for allowing me to participate in the care of this very pleasant patient.    Jacquelin Hawking, NP [place] at [facility] Cell - [phone] Pager- [phone] 11/15/2019 3:42 PM

## 2019-11-08 NOTE — Telephone Encounter (Signed)
Does he want to come in for labs, IV fluids and stool sample?  Sonia Baller

## 2019-11-08 NOTE — Telephone Encounter (Signed)
Patient coming in at 12:45 PM

## 2019-11-08 NOTE — Telephone Encounter (Signed)
Labs, [facility], possible fluids please

## 2019-11-09 ENCOUNTER — Other Ambulatory Visit: Payer: Self-pay

## 2019-11-09 ENCOUNTER — Encounter: Payer: Self-pay | Admitting: Oncology

## 2019-11-10 ENCOUNTER — Inpatient Hospital Stay: Payer: Medicare Other

## 2019-11-10 ENCOUNTER — Other Ambulatory Visit: Payer: Self-pay

## 2019-11-10 ENCOUNTER — Inpatient Hospital Stay (HOSPITAL_BASED_OUTPATIENT_CLINIC_OR_DEPARTMENT_OTHER): Payer: Medicare Other | Admitting: Oncology

## 2019-11-10 VITALS — Temp 96.2°F

## 2019-11-10 VITALS — BP 123/75 | HR 88 | Resp 16 | Wt 132.2 lb

## 2019-11-10 DIAGNOSIS — C155 Malignant neoplasm of lower third of esophagus: Secondary | ICD-10-CM

## 2019-11-10 DIAGNOSIS — I2510 Atherosclerotic heart disease of native coronary artery without angina pectoris: Secondary | ICD-10-CM

## 2019-11-10 DIAGNOSIS — R197 Diarrhea, unspecified: Secondary | ICD-10-CM

## 2019-11-10 DIAGNOSIS — I2583 Coronary atherosclerosis due to lipid rich plaque: Secondary | ICD-10-CM | POA: Diagnosis not present

## 2019-11-10 DIAGNOSIS — Z5111 Encounter for antineoplastic chemotherapy: Secondary | ICD-10-CM | POA: Diagnosis not present

## 2019-11-10 DIAGNOSIS — N289 Disorder of kidney and ureter, unspecified: Secondary | ICD-10-CM | POA: Diagnosis not present

## 2019-11-10 DIAGNOSIS — Z5189 Encounter for other specified aftercare: Secondary | ICD-10-CM | POA: Diagnosis not present

## 2019-11-10 DIAGNOSIS — R531 Weakness: Secondary | ICD-10-CM | POA: Diagnosis not present

## 2019-11-10 LAB — COMPREHENSIVE METABOLIC PANEL
ALT: 15 U/L (ref 0–44)
AST: 16 U/L (ref 15–41)
Albumin: 3.2 g/dL — ABNORMAL LOW (ref 3.5–5.0)
Alkaline Phosphatase: 66 U/L (ref 38–126)
Anion gap: 7 (ref 5–15)
BUN: 13 mg/dL (ref 8–23)
CO2: 22 mmol/L (ref 22–32)
Calcium: 8.5 mg/dL — ABNORMAL LOW (ref 8.9–10.3)
Chloride: 109 mmol/L (ref 98–111)
Creatinine, Ser: 1.25 mg/dL — ABNORMAL HIGH (ref 0.61–1.24)
GFR calc Af Amer: >60 mL/min (ref >60)
GFR calc non Af Amer: 57 mL/min — ABNORMAL LOW (ref >60)
Glucose, Bld: 101 mg/dL — ABNORMAL HIGH (ref 70–99)
Potassium: 3.9 mmol/L (ref 3.5–5.1)
Sodium: 138 mmol/L (ref 135–145)
Total Bilirubin: 0.5 mg/dL (ref 0.3–1.2)
Total Protein: 6.9 g/dL (ref 6.5–8.1)

## 2019-11-10 LAB — CBC WITH DIFFERENTIAL/PLATELET
Abs Immature Granulocytes: 0.06 10*3/uL (ref 0.00–0.07)
Basophils Absolute: 0.1 10*3/uL (ref 0.0–0.1)
Basophils Relative: 1 %
Eosinophils Absolute: 0.1 10*3/uL (ref 0.0–0.5)
Eosinophils Relative: 1 %
HCT: 31.2 % — ABNORMAL LOW (ref 39.0–52.0)
Hemoglobin: 9.5 g/dL — ABNORMAL LOW (ref 13.0–17.0)
Immature Granulocytes: 1 %
Lymphocytes Relative: 6 %
Lymphs Abs: 0.6 10*3/uL — ABNORMAL LOW (ref 0.7–4.0)
MCH: 32.2 pg (ref 26.0–34.0)
MCHC: 30.4 g/dL (ref 30.0–36.0)
MCV: 105.8 fL — ABNORMAL HIGH (ref 80.0–100.0)
Monocytes Absolute: 0.9 10*3/uL (ref 0.1–1.0)
Monocytes Relative: 8 %
Neutro Abs: 9 10*3/uL — ABNORMAL HIGH (ref 1.7–7.7)
Neutrophils Relative %: 83 %
Platelets: 172 10*3/uL (ref 150–400)
RBC: 2.95 MIL/uL — ABNORMAL LOW (ref 4.22–5.81)
RDW: 21.2 % — ABNORMAL HIGH (ref 11.5–15.5)
WBC: 10.7 10*3/uL — ABNORMAL HIGH (ref 4.0–10.5)
nRBC: 0 % (ref 0.0–0.2)

## 2019-11-10 LAB — GASTROINTESTINAL PANEL BY PCR, STOOL (REPLACES STOOL CULTURE)

## 2019-11-10 LAB — C DIFFICILE QUICK SCREEN W PCR REFLEX
C Diff antigen: NEGATIVE
C Diff interpretation: NOT DETECTED
C Diff toxin: NEGATIVE

## 2019-11-10 MED ORDER — SODIUM CHLORIDE 0.9 % IV SOLN
10.0000 mg | Freq: Once | INTRAVENOUS | Status: AC
  Administered 2019-11-10: 10:00:00 10 mg via INTRAVENOUS
  Filled 2019-11-10: qty 10

## 2019-11-10 MED ORDER — PALONOSETRON HCL INJECTION 0.25 MG/5ML
0.2500 mg | Freq: Once | INTRAVENOUS | Status: AC
  Administered 2019-11-10: 10:00:00 0.25 mg via INTRAVENOUS
  Filled 2019-11-10: qty 5

## 2019-11-10 MED ORDER — FLUOROURACIL CHEMO INJECTION 2.5 GM/50ML
400.0000 mg/m2 | Freq: Once | INTRAVENOUS | Status: AC
  Administered 2019-11-10: 13:00:00 700 mg via INTRAVENOUS
  Filled 2019-11-10: qty 14

## 2019-11-10 MED ORDER — OCTREOTIDE ACETATE 20 MG IM KIT
20.0000 mg | PACK | Freq: Once | INTRAMUSCULAR | Status: AC
  Administered 2019-11-10: 13:00:00 20 mg via INTRAMUSCULAR
  Filled 2019-11-10: qty 1

## 2019-11-10 MED ORDER — DEXTROSE 5 % IV SOLN
Freq: Once | INTRAVENOUS | Status: AC
  Filled 2019-11-10: qty 250

## 2019-11-10 MED ORDER — HEPARIN SOD (PORK) LOCK FLUSH 100 UNIT/ML IV SOLN
500.0000 [IU] | Freq: Once | INTRAVENOUS | Status: DC
  Filled 2019-11-10: qty 5

## 2019-11-10 MED ORDER — LEUCOVORIN CALCIUM INJECTION 350 MG
398.0000 mg/m2 | Freq: Once | INTRAVENOUS | Status: AC
  Administered 2019-11-10: 11:00:00 700 mg via INTRAVENOUS
  Filled 2019-11-10: qty 35

## 2019-11-10 MED ORDER — SODIUM CHLORIDE 0.9 % IV SOLN
2400.0000 mg/m2 | INTRAVENOUS | Status: DC
  Administered 2019-11-10: 13:00:00 4200 mg via INTRAVENOUS
  Filled 2019-11-10: qty 84

## 2019-11-10 MED ORDER — OXALIPLATIN CHEMO INJECTION 100 MG/20ML
85.0000 mg/m2 | Freq: Once | INTRAVENOUS | Status: AC
  Administered 2019-11-10: 11:00:00 150 mg via INTRAVENOUS
  Filled 2019-11-10: qty 20

## 2019-11-10 MED ORDER — SODIUM CHLORIDE 0.9% FLUSH
10.0000 mL | Freq: Once | INTRAVENOUS | Status: AC
  Administered 2019-11-10: 09:00:00 10 mL via INTRAVENOUS
  Filled 2019-11-10: qty 10

## 2019-11-11 ENCOUNTER — Ambulatory Visit: Payer: Medicare Other | Admitting: Oncology

## 2019-11-11 ENCOUNTER — Ambulatory Visit: Payer: Medicare Other

## 2019-11-11 ENCOUNTER — Other Ambulatory Visit: Payer: Medicare Other

## 2019-11-12 ENCOUNTER — Other Ambulatory Visit: Payer: Self-pay

## 2019-11-12 ENCOUNTER — Inpatient Hospital Stay: Payer: Medicare Other

## 2019-11-12 VITALS — BP 157/85 | HR 53 | Temp 95.5°F | Resp 18

## 2019-11-12 DIAGNOSIS — Z5111 Encounter for antineoplastic chemotherapy: Secondary | ICD-10-CM | POA: Diagnosis not present

## 2019-11-12 DIAGNOSIS — C155 Malignant neoplasm of lower third of esophagus: Secondary | ICD-10-CM | POA: Diagnosis not present

## 2019-11-12 DIAGNOSIS — Z5189 Encounter for other specified aftercare: Secondary | ICD-10-CM | POA: Diagnosis not present

## 2019-11-12 DIAGNOSIS — R197 Diarrhea, unspecified: Secondary | ICD-10-CM | POA: Diagnosis not present

## 2019-11-12 DIAGNOSIS — R531 Weakness: Secondary | ICD-10-CM | POA: Diagnosis not present

## 2019-11-12 DIAGNOSIS — N289 Disorder of kidney and ureter, unspecified: Secondary | ICD-10-CM | POA: Diagnosis not present

## 2019-11-12 MED ORDER — HEPARIN SOD (PORK) LOCK FLUSH 100 UNIT/ML IV SOLN
500.0000 [IU] | Freq: Once | INTRAVENOUS | Status: AC | PRN
  Administered 2019-11-12: 500 [IU]
  Filled 2019-11-12: qty 5

## 2019-11-12 MED ORDER — HEPARIN SOD (PORK) LOCK FLUSH 100 UNIT/ML IV SOLN
INTRAVENOUS | Status: AC
  Filled 2019-11-12: qty 5

## 2019-11-12 MED ORDER — PEGFILGRASTIM-CBQV 6 MG/0.6ML ~~LOC~~ SOSY
6.0000 mg | PREFILLED_SYRINGE | Freq: Once | SUBCUTANEOUS | Status: AC
  Administered 2019-11-12: 6 mg via SUBCUTANEOUS
  Filled 2019-11-12: qty 0.6

## 2019-11-13 NOTE — Progress Notes (Signed)
[place]  [phone] [phone]  ID: [name] OB: [birth year]  MR#: [id number]  IPJ#:[id number]  Patient Care Team: [name], MD as PCP - General (Family Medicine) [name], MD as PCP - Family Medicine (Family Medicine) [name], MD as Consulting Physician (Oncology) [name], MD as Referring Physician [name], MD as Consulting Physician (Cardiology)  CHIEF COMPLAINT: Recurrent adenocarcinoma of the lower third of the esophagus.  INTERVAL HISTORY: Patient returns to clinic today for further evaluation and consideration of IV fluids.  He states his diarrhea is still persistent, but overall improved over the last week. He continues to have chronic weakness and fatigue. He does not complain of dysphagia or difficulty swallowing today.  His appetite has improved his weight has remained stable.  He has no neurologic complaints.  He denies any recent fevers or illnesses.  He denies any chest pain, shortness of breath, cough, or hemoptysis.  He denies any nausea, vomiting, or constipation. He has no melena or hematochezia.  He has no urinary complaints.  Patient offers no further specific complaints today.  REVIEW OF SYSTEMS:   Review of Systems  Constitutional: Positive for malaise/fatigue. Negative for fever and weight loss.  HENT: Negative.   Respiratory: Negative.  Negative for cough and shortness of breath.   Cardiovascular: Negative.  Negative for chest pain and leg swelling.  Gastrointestinal: Positive for diarrhea. Negative for abdominal pain, blood in stool, constipation, melena, nausea and vomiting.  Genitourinary: Negative.  Negative for frequency and urgency.  Musculoskeletal: Negative.  Negative for back pain.  Skin: Negative.  Negative for rash.  Neurological: Positive for weakness. Negative for tingling, focal weakness and headaches.  Psychiatric/Behavioral: Negative.  The patient is not  nervous/anxious and does not have insomnia.     As per HPI. Otherwise, a complete review of systems is negative.  PAST MEDICAL HISTORY: Past Medical History:  Diagnosis Date  . Abnormal white blood cell (WBC) count    seeing Dr. [name] at [place] on 10/25  . Atrial fibrillation ([place])   . BPH (benign prostatic hypertrophy)   . Bradycardia   . CAD (coronary artery disease)   . Dysrhythmia    bradycardia - sees Dr. Humphrey Rolls  . Esophageal cancer ([place]) 05/2016  . GERD (gastroesophageal reflux disease)   . History of kidney stones   . Hyperlipidemia   . Hypertension   . Hypothyroidism   . Kidney stone   . Myocardial infarction ([place]) 2005  . S/P angioplasty with stent 2005   x 4 vessels  . Sleep apnea    mild-NO CPAP  . Spinal stenosis    lumbar    PAST SURGICAL HISTORY: Past Surgical History:  Procedure Laterality Date  . BACK SURGERY    . CARDIAC CATHETERIZATION Left 03/12/2016   Procedure: Left Heart Cath and Coronary Angiography;  Surgeon: [name], MD;  Location: [facility];  Service: Cardiovascular;  Laterality: Left;  . CARDIAC CATHETERIZATION N/A 03/12/2016   Procedure: Coronary Stent Intervention;  Surgeon: Yolonda Kida, MD;  Location: [facility];  Service: Cardiovascular;  Laterality: N/A;  . COLONOSCOPY WITH PROPOFOL N/A 07/13/2015   Procedure: COLONOSCOPY WITH PROPOFOL;  Surgeon: Lucilla Lame, MD;  Location: [facility];  Service: Endoscopy;  Laterality: N/A;  . CORONARY ANGIOPLASTY  10/05 and 12/05   4 DES placed  . CYSTOSCOPY W/ RETROGRADES Left 06/15/2019   Procedure: CYSTOSCOPY WITH RETROGRADE PYELOGRAM;  Surgeon: Bernardo Heater,  Ronda Fairly, MD;  Location: [facility];  Service: Urology;  Laterality: Left;  . CYSTOSCOPY/URETEROSCOPY/HOLMIUM LASER/STENT PLACEMENT Left 06/15/2019   Procedure: CYSTOSCOPY/URETEROSCOPY/HOLMIUM LASER/STENT PLACEMENT;  Surgeon: Abbie Sons, MD;  Location: [facility];  Service: Urology;  Laterality: Left;  .  ESOPHAGECTOMY  10/07/2016   @ [facility]  . ESOPHAGOGASTRODUODENOSCOPY (EGD) WITH PROPOFOL N/A 06/14/2016   Procedure: ESOPHAGOGASTRODUODENOSCOPY (EGD) WITH PROPOFOL;  Surgeon: [name], MD;  Location: [facility];  Service: Endoscopy;  Laterality: N/A;  . HERNIA REPAIR Bilateral    x3  . INGUINAL HERNIA REPAIR Right 08/28/2017   Large PerFix plug, recurrent hernia;  Surgeon: Robert Bellow, MD;  Location: [facility];  Service: General;  Laterality: Right;  recurrent  . KIDNEY STONE SURGERY  07/15/2019  . POLYPECTOMY  07/13/2015   Procedure: POLYPECTOMY;  Surgeon: Lucilla Lame, MD;  Location: [place];  Service: Endoscopy;;  . PORT-A-CATH REMOVAL N/A 01/16/2017   Procedure: REMOVAL PORT-A-CATH;  Surgeon: Olean Ree, MD;  Location: [facility];  Service: General;  Laterality: N/A;  . PORTACATH PLACEMENT N/A 07/08/2016   Procedure: INSERTION PORT-A-CATH;  Surgeon: Olean Ree, MD;  Location: [facility];  Service: General;  Laterality: N/A;  . PORTACATH PLACEMENT Right 08/30/2019   Procedure: INSERTION PORT-A-CATH;  Surgeon: Olean Ree, MD;  Location: [facility];  Service: General;  Laterality: Right;    FAMILY HISTORY Family History  Problem Relation Age of Onset  . Hypertension Mother   . Dementia Mother   . Heart disease Father   . Hypertension Father   . Kidney disease Father   . Hypertension Brother   . Heart disease Brother   . Diabetes Son   . Hypertension Son   . Hyperlipidemia Son   . Hyperlipidemia Daughter   . Hypertension Daughter        ADVANCED DIRECTIVES:    HEALTH MAINTENANCE: Social History   Tobacco Use  . Smoking status: Former Smoker    Packs/day: 1.00    Years: 40.00    Pack years: 40.00    Types: Cigarettes    Quit date: 07/16/2004    Years since quitting: 15.3  . Smokeless tobacco: Former Systems developer    Types: Chew  Substance Use Topics  . Alcohol use: Yes    Alcohol/week: 1.0 standard drinks    Types: 1 Cans of beer per week  . Drug  use: No     Colonoscopy:  PAP:  Bone density:  Lipid panel:  No Known Allergies  Current Outpatient Medications  Medication Sig Dispense Refill  . acetaminophen (TYLENOL) 500 MG tablet Take 500 mg by mouth every 6 (six) hours as needed for mild pain or moderate pain.    Marland Kitchen aspirin 81 MG tablet Take 81 mg by mouth daily.     Marland Kitchen atorvastatin (LIPITOR) 40 MG tablet Take 1 tablet (40 mg total) by mouth daily. 90 tablet 1  . clopidogrel (PLAVIX) 75 MG tablet Take 1 tablet (75 mg total) by mouth daily. 90 tablet 2  . diphenoxylate-atropine (LOMOTIL) 2.5-0.025 MG tablet Take 2 tablets by mouth 4 (four) times daily as needed for diarrhea or loose stools. 30 tablet 0  . isosorbide mononitrate (IMDUR) 30 MG 24 hr tablet Take 1 tablet (30 mg total) by mouth every evening. 90 tablet 1  . levothyroxine (SYNTHROID) 50 MCG tablet Take 1 tablet (50 mcg total) by mouth daily. 90 tablet 1  . lisinopril (ZESTRIL) 2.5 MG tablet Take 1 tablet (2.5 mg total) by mouth every morning. [place]  tablet 1  . loperamide (IMODIUM A-D) 2 MG tablet Take 2 mg by mouth 4 (four) times daily as needed for diarrhea or loose stools.    . metoprolol succinate (TOPROL-XL) 25 MG 24 hr tablet Take 25 mg by mouth daily.    . multivitamin-iron-minerals-folic acid (CENTRUM) chewable tablet Chew 1 tablet by mouth daily.    . nitroGLYCERIN (NITROSTAT) 0.4 MG SL tablet Place 1 tablet under the tongue every 5 (five) minutes as needed for chest pain.   98  . ondansetron (ZOFRAN) 8 MG tablet Take 1 tablet (8 mg total) by mouth 2 (two) times daily as needed for refractory nausea / vomiting. Start on day 3 after chemotherapy. 60 tablet 2  . pantoprazole (PROTONIX) 20 MG tablet Take 1 tablet (20 mg total) by mouth daily. 90 tablet 0  . prochlorperazine (COMPAZINE) 10 MG tablet Take 1 tablet (10 mg total) by mouth every 6 (six) hours as needed (Nausea or vomiting). 60 tablet 2   No current facility-administered medications for this visit.    Facility-Administered Medications Ordered in Other Visits  Medication Dose Route Frequency Provider Last Rate Last Admin  . heparin lock flush 100 unit/mL  500 Units Intravenous Once [name], MD        OBJECTIVE: Vitals:   11/18/19 0928  BP: 110/67  Pulse: (!) 58  Temp: (!) 96.6 F (35.9 C)     Body mass index is 19.04 kg/m.    ECOG FS:0 - Asymptomatic  General: Well-developed, well-nourished, no acute distress. Eyes: Pink conjunctiva, anicteric sclera. HEENT: Normocephalic, moist mucous membranes. Lungs: No audible wheezing or coughing. Heart: Regular rate and rhythm. Abdomen: Soft, nontender, no obvious distention. Musculoskeletal: No edema, cyanosis, or clubbing. Neuro: Alert, answering all questions appropriately. Cranial nerves grossly intact. Skin: No rashes or petechiae noted. Psych: Normal affect.   LAB RESULTS:  Lab Results  Component Value Date   NA 136 11/18/2019   K 4.8 11/18/2019   CL 109 11/18/2019   CO2 20 (L) 11/18/2019   GLUCOSE 97 11/18/2019   BUN 17 11/18/2019   CREATININE 1.43 (H) 11/18/2019   CALCIUM 8.7 (L) 11/18/2019   PROT 7.0 11/18/2019   ALBUMIN 3.6 11/18/2019   AST 20 11/18/2019   ALT 15 11/18/2019   ALKPHOS 116 11/18/2019   BILITOT 0.6 11/18/2019   GFRNONAA 48 (L) 11/18/2019   GFRAA 56 (L) 11/18/2019    Lab Results  Component Value Date   WBC 16.5 (H) 11/18/2019   NEUTROABS 13.2 (H) 11/18/2019   HGB 9.0 (L) 11/18/2019   HCT 30.1 (L) 11/18/2019   MCV 106.7 (H) 11/18/2019   PLT 107 (L) 11/18/2019     STUDIES: No results found.  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  PLAN:    1.  Recurrent adenocarcinoma of the lower third of the esophagus: Patient initially completed 6 cycles of weekly carboplatinum and Taxol on August 21, 2016 and daily XRT completed on August 27, 2016. Patient had his esophagectomy on October 07, 2016.  His most recent [facility] at [facility]  suggested recurrence which was confirmed with biopsy.  Per recommendation of [facility], will proceed with palliative FOLFOX.  Awaiting final pathology results to assess whether nivolumab will be of benefit.  Patient received cycle 5 of FOLFOX last week.  Return to clinic in 1 week for further evaluation and consideration of cycle 6.  Plan to reimage at the conclusion of cycle 6. 2.  Diarrhea: Patient reported  today that the only fluid intake he has is Sprite.  His diarrhea may be osmotic in nature given the high fructose content.  He has been instructed to discontinue soda and drink only water over the next week.  Continue Lomotil.  Patient received octreotide last week. 3.  Thrombocytopenia: Mild, secondary treatment.  Monitor. 4.  Renal insufficiency: Creatinine has trended up, IV fluids as above. 5.  Anemia: Chronic and unchanged.  Patient's hemoglobin is 9.0 today. 6.  Leukocytosis: Secondary to Udenyca. Monitor.   Patient expressed understanding and was in agreement with this plan. He also understands that He can call clinic at any time with any questions, concerns, or complaints.    [name], MD 11/18/19 10:14 AM

## 2019-11-16 ENCOUNTER — Other Ambulatory Visit: Payer: Self-pay | Admitting: *Deleted

## 2019-11-16 DIAGNOSIS — C155 Malignant neoplasm of lower third of esophagus: Secondary | ICD-10-CM

## 2019-11-16 MED ORDER — DIPHENOXYLATE-ATROPINE 2.5-0.025 MG PO TABS: 2.0000 | ORAL_TABLET | Freq: Four times a day (QID) | ORAL | 0 refills | Status: DC | PRN

## 2019-11-16 NOTE — Telephone Encounter (Signed)
Patient's wife called requesting order for lomotil. She states he is still having diarrhea and the the LOMOTIL did help when he was taking it. Order pended per MD approval.

## 2019-11-17 ENCOUNTER — Other Ambulatory Visit: Payer: Self-pay

## 2019-11-17 ENCOUNTER — Inpatient Hospital Stay: Payer: Medicare Other | Attending: Hospice and Palliative Medicine | Admitting: Hospice and Palliative Medicine

## 2019-11-17 DIAGNOSIS — Z8349 Family history of other endocrine, nutritional and metabolic diseases: Secondary | ICD-10-CM | POA: Insufficient documentation

## 2019-11-17 DIAGNOSIS — Z841 Family history of disorders of kidney and ureter: Secondary | ICD-10-CM | POA: Insufficient documentation

## 2019-11-17 DIAGNOSIS — N400 Enlarged prostate without lower urinary tract symptoms: Secondary | ICD-10-CM | POA: Insufficient documentation

## 2019-11-17 DIAGNOSIS — Z818 Family history of other mental and behavioral disorders: Secondary | ICD-10-CM | POA: Insufficient documentation

## 2019-11-17 DIAGNOSIS — I4891 Unspecified atrial fibrillation: Secondary | ICD-10-CM | POA: Insufficient documentation

## 2019-11-17 DIAGNOSIS — Z923 Personal history of irradiation: Secondary | ICD-10-CM | POA: Insufficient documentation

## 2019-11-17 DIAGNOSIS — I252 Old myocardial infarction: Secondary | ICD-10-CM | POA: Insufficient documentation

## 2019-11-17 DIAGNOSIS — C155 Malignant neoplasm of lower third of esophagus: Secondary | ICD-10-CM

## 2019-11-17 DIAGNOSIS — E039 Hypothyroidism, unspecified: Secondary | ICD-10-CM | POA: Insufficient documentation

## 2019-11-17 DIAGNOSIS — R5383 Other fatigue: Secondary | ICD-10-CM | POA: Insufficient documentation

## 2019-11-17 DIAGNOSIS — Z66 Do not resuscitate: Secondary | ICD-10-CM | POA: Insufficient documentation

## 2019-11-17 DIAGNOSIS — R918 Other nonspecific abnormal finding of lung field: Secondary | ICD-10-CM | POA: Insufficient documentation

## 2019-11-17 DIAGNOSIS — R531 Weakness: Secondary | ICD-10-CM | POA: Insufficient documentation

## 2019-11-17 DIAGNOSIS — D649 Anemia, unspecified: Secondary | ICD-10-CM | POA: Insufficient documentation

## 2019-11-17 DIAGNOSIS — Z79899 Other long term (current) drug therapy: Secondary | ICD-10-CM | POA: Insufficient documentation

## 2019-11-17 DIAGNOSIS — Z87891 Personal history of nicotine dependence: Secondary | ICD-10-CM | POA: Insufficient documentation

## 2019-11-17 DIAGNOSIS — E785 Hyperlipidemia, unspecified: Secondary | ICD-10-CM | POA: Insufficient documentation

## 2019-11-17 DIAGNOSIS — Z9221 Personal history of antineoplastic chemotherapy: Secondary | ICD-10-CM | POA: Insufficient documentation

## 2019-11-17 DIAGNOSIS — Z833 Family history of diabetes mellitus: Secondary | ICD-10-CM | POA: Insufficient documentation

## 2019-11-17 DIAGNOSIS — N289 Disorder of kidney and ureter, unspecified: Secondary | ICD-10-CM | POA: Insufficient documentation

## 2019-11-17 DIAGNOSIS — Z87442 Personal history of urinary calculi: Secondary | ICD-10-CM | POA: Insufficient documentation

## 2019-11-17 DIAGNOSIS — D696 Thrombocytopenia, unspecified: Secondary | ICD-10-CM | POA: Insufficient documentation

## 2019-11-17 DIAGNOSIS — D72829 Elevated white blood cell count, unspecified: Secondary | ICD-10-CM | POA: Insufficient documentation

## 2019-11-17 DIAGNOSIS — Z8249 Family history of ischemic heart disease and other diseases of the circulatory system: Secondary | ICD-10-CM | POA: Insufficient documentation

## 2019-11-17 DIAGNOSIS — D61818 Other pancytopenia: Secondary | ICD-10-CM | POA: Insufficient documentation

## 2019-11-17 DIAGNOSIS — Z515 Encounter for palliative care: Secondary | ICD-10-CM

## 2019-11-17 DIAGNOSIS — R197 Diarrhea, unspecified: Secondary | ICD-10-CM | POA: Insufficient documentation

## 2019-11-17 DIAGNOSIS — I2510 Atherosclerotic heart disease of native coronary artery without angina pectoris: Secondary | ICD-10-CM | POA: Insufficient documentation

## 2019-11-17 DIAGNOSIS — Z5111 Encounter for antineoplastic chemotherapy: Secondary | ICD-10-CM | POA: Insufficient documentation

## 2019-11-17 DIAGNOSIS — D72819 Decreased white blood cell count, unspecified: Secondary | ICD-10-CM | POA: Insufficient documentation

## 2019-11-17 NOTE — Progress Notes (Signed)
Patient prescreened for appointment. Patient has no concerns or questions.  

## 2019-11-17 NOTE — Progress Notes (Signed)
I was unable to reach patient by phone.  Voicemail was left.  Will reschedule.

## 2019-11-18 ENCOUNTER — Other Ambulatory Visit: Payer: Medicare Other

## 2019-11-18 ENCOUNTER — Inpatient Hospital Stay: Payer: Medicare Other

## 2019-11-18 ENCOUNTER — Other Ambulatory Visit: Payer: Self-pay

## 2019-11-18 ENCOUNTER — Inpatient Hospital Stay (HOSPITAL_BASED_OUTPATIENT_CLINIC_OR_DEPARTMENT_OTHER): Payer: Medicare Other | Admitting: Oncology

## 2019-11-18 VITALS — BP 110/67 | HR 58 | Temp 96.6°F | Wt 128.9 lb

## 2019-11-18 DIAGNOSIS — C155 Malignant neoplasm of lower third of esophagus: Secondary | ICD-10-CM | POA: Diagnosis not present

## 2019-11-18 DIAGNOSIS — Z79899 Other long term (current) drug therapy: Secondary | ICD-10-CM | POA: Diagnosis not present

## 2019-11-18 DIAGNOSIS — Z515 Encounter for palliative care: Secondary | ICD-10-CM

## 2019-11-18 DIAGNOSIS — D696 Thrombocytopenia, unspecified: Secondary | ICD-10-CM | POA: Diagnosis not present

## 2019-11-18 DIAGNOSIS — N289 Disorder of kidney and ureter, unspecified: Secondary | ICD-10-CM | POA: Diagnosis not present

## 2019-11-18 DIAGNOSIS — I2510 Atherosclerotic heart disease of native coronary artery without angina pectoris: Secondary | ICD-10-CM

## 2019-11-18 DIAGNOSIS — R5383 Other fatigue: Secondary | ICD-10-CM | POA: Diagnosis not present

## 2019-11-18 DIAGNOSIS — R531 Weakness: Secondary | ICD-10-CM | POA: Diagnosis not present

## 2019-11-18 DIAGNOSIS — Z9221 Personal history of antineoplastic chemotherapy: Secondary | ICD-10-CM | POA: Diagnosis not present

## 2019-11-18 DIAGNOSIS — I2583 Coronary atherosclerosis due to lipid rich plaque: Secondary | ICD-10-CM

## 2019-11-18 DIAGNOSIS — Z87442 Personal history of urinary calculi: Secondary | ICD-10-CM | POA: Diagnosis not present

## 2019-11-18 DIAGNOSIS — D72819 Decreased white blood cell count, unspecified: Secondary | ICD-10-CM | POA: Diagnosis not present

## 2019-11-18 DIAGNOSIS — D72829 Elevated white blood cell count, unspecified: Secondary | ICD-10-CM | POA: Diagnosis not present

## 2019-11-18 DIAGNOSIS — E039 Hypothyroidism, unspecified: Secondary | ICD-10-CM | POA: Diagnosis not present

## 2019-11-18 DIAGNOSIS — R197 Diarrhea, unspecified: Secondary | ICD-10-CM

## 2019-11-18 DIAGNOSIS — R918 Other nonspecific abnormal finding of lung field: Secondary | ICD-10-CM | POA: Diagnosis not present

## 2019-11-18 DIAGNOSIS — Z66 Do not resuscitate: Secondary | ICD-10-CM | POA: Diagnosis not present

## 2019-11-18 DIAGNOSIS — Z923 Personal history of irradiation: Secondary | ICD-10-CM | POA: Diagnosis not present

## 2019-11-18 DIAGNOSIS — D61818 Other pancytopenia: Secondary | ICD-10-CM | POA: Diagnosis not present

## 2019-11-18 DIAGNOSIS — N400 Enlarged prostate without lower urinary tract symptoms: Secondary | ICD-10-CM | POA: Diagnosis not present

## 2019-11-18 DIAGNOSIS — Z87891 Personal history of nicotine dependence: Secondary | ICD-10-CM | POA: Diagnosis not present

## 2019-11-18 DIAGNOSIS — Z5111 Encounter for antineoplastic chemotherapy: Secondary | ICD-10-CM | POA: Diagnosis not present

## 2019-11-18 DIAGNOSIS — I252 Old myocardial infarction: Secondary | ICD-10-CM | POA: Diagnosis not present

## 2019-11-18 DIAGNOSIS — D649 Anemia, unspecified: Secondary | ICD-10-CM | POA: Diagnosis not present

## 2019-11-18 DIAGNOSIS — E785 Hyperlipidemia, unspecified: Secondary | ICD-10-CM | POA: Diagnosis not present

## 2019-11-18 DIAGNOSIS — I4891 Unspecified atrial fibrillation: Secondary | ICD-10-CM | POA: Diagnosis not present

## 2019-11-18 LAB — CBC WITH DIFFERENTIAL/PLATELET
Abs Immature Granulocytes: 0 10*3/uL (ref 0.00–0.07)
Band Neutrophils: 13 %
Basophils Absolute: 0.2 10*3/uL — ABNORMAL HIGH (ref 0.0–0.1)
Basophils Relative: 1 %
Eosinophils Absolute: 0.3 10*3/uL (ref 0.0–0.5)
Eosinophils Relative: 2 %
HCT: 30.1 % — ABNORMAL LOW (ref 39.0–52.0)
Hemoglobin: 9 g/dL — ABNORMAL LOW (ref 13.0–17.0)
Lymphocytes Relative: 10 %
Lymphs Abs: 1.7 10*3/uL (ref 0.7–4.0)
MCH: 31.9 pg (ref 26.0–34.0)
MCHC: 29.9 g/dL — ABNORMAL LOW (ref 30.0–36.0)
MCV: 106.7 fL — ABNORMAL HIGH (ref 80.0–100.0)
Monocytes Absolute: 1.2 10*3/uL — ABNORMAL HIGH (ref 0.1–1.0)
Monocytes Relative: 7 %
Neutro Abs: 13.2 10*3/uL — ABNORMAL HIGH (ref 1.7–7.7)
Neutrophils Relative %: 67 %
Platelets: 107 10*3/uL — ABNORMAL LOW (ref 150–400)
RBC: 2.82 MIL/uL — ABNORMAL LOW (ref 4.22–5.81)
RDW: 20.1 % — ABNORMAL HIGH (ref 11.5–15.5)
Smear Review: NORMAL
WBC Morphology: 26
WBC: 16.5 10*3/uL — ABNORMAL HIGH (ref 4.0–10.5)
nRBC: 0 % (ref 0.0–0.2)

## 2019-11-18 LAB — COMPREHENSIVE METABOLIC PANEL
ALT: 15 U/L (ref 0–44)
AST: 20 U/L (ref 15–41)
Albumin: 3.6 g/dL (ref 3.5–5.0)
Alkaline Phosphatase: 116 U/L (ref 38–126)
Anion gap: 7 (ref 5–15)
BUN: 17 mg/dL (ref 8–23)
CO2: 20 mmol/L — ABNORMAL LOW (ref 22–32)
Calcium: 8.7 mg/dL — ABNORMAL LOW (ref 8.9–10.3)
Chloride: 109 mmol/L (ref 98–111)
Creatinine, Ser: 1.43 mg/dL — ABNORMAL HIGH (ref 0.61–1.24)
GFR calc Af Amer: 56 mL/min — ABNORMAL LOW (ref >60)
GFR calc non Af Amer: 48 mL/min — ABNORMAL LOW (ref >60)
Glucose, Bld: 97 mg/dL (ref 70–99)
Potassium: 4.8 mmol/L (ref 3.5–5.1)
Sodium: 136 mmol/L (ref 135–145)
Total Bilirubin: 0.6 mg/dL (ref 0.3–1.2)
Total Protein: 7 g/dL (ref 6.5–8.1)

## 2019-11-18 MED ORDER — SODIUM CHLORIDE 0.9% FLUSH
10.0000 mL | Freq: Once | INTRAVENOUS | Status: AC
  Administered 2019-11-18: 10 mL via INTRAVENOUS
  Filled 2019-11-18: qty 10

## 2019-11-18 MED ORDER — HEPARIN SOD (PORK) LOCK FLUSH 100 UNIT/ML IV SOLN
INTRAVENOUS | Status: AC
  Filled 2019-11-18: qty 5

## 2019-11-18 MED ORDER — HEPARIN SOD (PORK) LOCK FLUSH 100 UNIT/ML IV SOLN
500.0000 [IU] | Freq: Once | INTRAVENOUS | Status: AC
  Administered 2019-11-18: 11:00:00 500 [IU] via INTRAVENOUS
  Filled 2019-11-18: qty 5

## 2019-11-18 MED ORDER — SODIUM CHLORIDE 0.9 % IV SOLN
Freq: Once | INTRAVENOUS | Status: AC
  Filled 2019-11-18: qty 250

## 2019-11-18 NOTE — Progress Notes (Signed)
COMMUNITY PALLIATIVE CARE SW NOTE  PATIENT NAME: Corey Perry DOB: Nov 14, 1945 MRN: [id number]  PRIMARY CARE PROVIDER: [name], MD  RESPONSIBLE PARTY:  Acct ID - Guarantor Home Phone Work Phone Relationship Acct Type  [id number] [name],* [phone]  Self P/F     [place], [place], [place] [address]     PLAN OF CARE and INTERVENTIONS:             1. GOALS OF CARE/ ADVANCE CARE PLANNING:  Patient is DNR. SW provided Living Will/HCPOA form for patient/family to discuss. Goal is for patient to continue to "improve".  2. SOCIAL/EMOTIONAL/SPIRITUAL ASSESSMENT/ INTERVENTIONS:  SW and RN met with patient and Meryl Crutch (patient's wife) in the home. Patient was walking outside when SW arrived. Patient denies pain. No falls reported. Patient reports good appetite, has had nausea. Patient continues to experience loose bowels. RN discussed medications. Patient said that he is sleeping "better". 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 Patient enjoys his dog and messing around in and outside of the home. SW provided emotional support, validated concerns and used active and reflective listening.    3. PATIENT/CAREGIVER EDUCATION/ COPING:  Patient is alert, oriented. Patient is forgetful. Patient openly processes feelings. No coping concerns. Family is supportive. 4. PERSONAL EMERGENCY PLAN:  Family will call 9-1-1 for emergencies. 5. COMMUNITY RESOURCES COORDINATION/ HEALTH CARE NAVIGATION:  Ethel manages patient's care. Patient received infusion today. Patient is scheduled to have a call from Registered Dietitian on 3/8 to follow-up on patient's diet. Patient is scheduled for his last infusion on 3/10 and then they will reevaluate. 6. FINANCIAL/LEGAL CONCERNS/INTERVENTIONS:  None noted.     SOCIAL HX:  Social History   Tobacco Use  . Smoking status: Former Smoker    Packs/day: 1.00    Years: 40.00    Pack years: 40.00    Types: Cigarettes    Quit date: 07/16/2004    Years  since quitting: 15.3  . Smokeless tobacco: Former Systems developer    Types: Chew  Substance Use Topics  . Alcohol use: Yes    Alcohol/week: 1.0 standard drinks    Types: 1 Cans of beer per week    CODE STATUS:   Code Status: Prior (DNR) ADVANCED DIRECTIVES: N - provided form.  MOST FORM COMPLETE:  No. HOSPICE EDUCATION PROVIDED: None.  PPS: Patient is mostly independent of ADLs.   I spent70mnutes with patient/family, from1:00-1:45p providing education, support and consultation.  WMargaretmary Lombard LCSW

## 2019-11-18 NOTE — Progress Notes (Signed)
PATIENT NAME: Corey Perry DOB: December 04, 1945 MRN: [id number]  PRIMARY CARE PROVIDER: [name], MD  RESPONSIBLE PARTY:  Acct ID - Guarantor Home Phone Work Phone Relationship Acct Type  [id number] [name],* [phone]  Self P/F     [place], [place], [place] [address]    PLAN OF CARE and INTERVENTIONS:               1.  GOALS OF CARE/ ADVANCE CARE PLANNING:  Remain at home and continue to receive chemotherapy.               2.  PATIENT/CAREGIVER EDUCATION:  Education on s/s of infection, education on side effects of chemo, reviewed meds, support               3.  DISEASE STATUS: SW and RN made scheduled palliative care home visit. Palliative care team met with patient and his wife [name]. Patient was seen at the [facility] earlier today and received fluids. Patient has been going to the cancer center weekly. Patient sees Dr. [name] at the [facility]. Patient denies pain at the present time. Patient remains independent with ambulation and able to do his own ADL's.  Patient reports he is scheduled to receive 1 more chemo treatment.  Patient reports MD will order scan to determine if he if chemo is working. Patient reports his appetite is good.  Patients current weight is 128 lbs. Wife reports when patient eats he suffers with severe diarrhea. Patient has Imodium and Lomotil in the home to take for diarrhea. Patient denies having any nausea or vomiting at the present time. Patient has Zofran and Compazine in the home for nausea and vomiting.  Wife reports patient will have nausea at times after receiving chemo. Patient denies suffering any recent falls. Patient reports he has been sleeping well at night. Patient reports prior to being diagnosed with cancer he weighed 215 - 220 lbs. Patient vital signs are stable. Patient's breath sounds are clear and patient denies having any cough or shortness of breath. Patient has no edema in his lower extremities. Nurse reviewed patient's  medications with patient. Patient talks about trying to eat healthy and do his part to stay healthy.  Patient and wife hopeful that chemo will cure patients cancer. Patient and wife talked about their daughter living in [place].  Patient and wife spoke of their sons death and remain close to their grandson.  Patient and wife in agreement with palliative care services. Patient and wife encouraged to contact palliative care with questions or concerns.     HISTORY OF PRESENT ILLNESS:  Patient is a 74 year old male who resides in home with his wife.  Patient is followed by palliative care and is seen monthly and PRN.   CODE STATUS: No Code  ADVANCED DIRECTIVES: Y MOST FORM: No PPS:60%   PHYSICAL EXAM:   VITALS: Today's Vitals   11/18/19 1311  Weight: 128 lb (58.1 kg)  PainSc: 0-No pain    LUNGS: clear to auscultation  CARDIAC: Cor RRR  EXTREMITIES: none edema SKIN: Skin color, texture, turgor normal. No rashes or lesions  NEURO: Alert and oriented x 3       [name], RN

## 2019-11-21 NOTE — Progress Notes (Signed)
[place]  [phone] [phone]  ID: Corey Perry OB: [birth year]  MR#: [id number]  MKL#:[id number]  Patient Care Team: [name], MD as PCP - General (Family Medicine) [name], MD as PCP - Family Medicine (Family Medicine) [name], MD as Consulting Physician (Oncology) [name], MD as Referring Physician [name], MD as Consulting Physician (Cardiology)  CHIEF COMPLAINT: Recurrent adenocarcinoma of the lower third of the esophagus.  INTERVAL HISTORY: Patient returns to clinic today for further evaluation and consideration of cycle 6 of FOLFOX.  Since discontinuing high fructose beverages, his diarrhea has improved.  He continues to have chronic weakness and fatigue, but otherwise feels well. He does not complain of dysphagia or difficulty swallowing today.  His appetite has improved his weight has remained stable.  He has no neurologic complaints.  He denies any recent fevers or illnesses.  He denies any chest pain, shortness of breath, cough, or hemoptysis.  He denies any nausea, vomiting, or constipation. He has no melena or hematochezia.  He has no urinary complaints.  Patient offers no further specific complaints today.  REVIEW OF SYSTEMS:   Review of Systems  Constitutional: Positive for malaise/fatigue. Negative for fever and weight loss.  HENT: Negative.   Respiratory: Negative.  Negative for cough and shortness of breath.   Cardiovascular: Negative.  Negative for chest pain and leg swelling.  Gastrointestinal: Positive for diarrhea. Negative for abdominal pain, blood in stool, constipation, melena, nausea and vomiting.  Genitourinary: Negative.  Negative for frequency and urgency.  Musculoskeletal: Negative.  Negative for back pain.  Skin: Negative.  Negative for rash.  Neurological: Positive for weakness. Negative for tingling, focal weakness and headaches.  Psychiatric/Behavioral:  Negative.  The patient is not nervous/anxious and does not have insomnia.     As per HPI. Otherwise, a complete review of systems is negative.  PAST MEDICAL HISTORY: Past Medical History:  Diagnosis Date  . Abnormal white blood cell (WBC) count    seeing Dr. [name] at [place] on 10/25  . Atrial fibrillation ([place])   . BPH (benign prostatic hypertrophy)   . Bradycardia   . CAD (coronary artery disease)   . Dysrhythmia    bradycardia - sees Dr. Humphrey Rolls  . Esophageal cancer ([place]) 05/2016  . GERD (gastroesophageal reflux disease)   . History of kidney stones   . Hyperlipidemia   . Hypertension   . Hypothyroidism   . Kidney stone   . Myocardial infarction ([place]) 2005  . S/P angioplasty with stent 2005   x 4 vessels  . Sleep apnea    mild-NO CPAP  . Spinal stenosis    lumbar    PAST SURGICAL HISTORY: Past Surgical History:  Procedure Laterality Date  . BACK SURGERY    . CARDIAC CATHETERIZATION Left 03/12/2016   Procedure: Left Heart Cath and Coronary Angiography;  Surgeon: [name], MD;  Location: [facility];  Service: Cardiovascular;  Laterality: Left;  . CARDIAC CATHETERIZATION N/A 03/12/2016   Procedure: Coronary Stent Intervention;  Surgeon: Yolonda Kida, MD;  Location: [facility];  Service: Cardiovascular;  Laterality: N/A;  . COLONOSCOPY WITH PROPOFOL N/A 07/13/2015   Procedure: COLONOSCOPY WITH PROPOFOL;  Surgeon: Lucilla Lame, MD;  Location: [facility] [facility];  Service: Endoscopy;  Laterality: N/A;  . CORONARY ANGIOPLASTY  10/05 and 12/05   4 DES placed  . CYSTOSCOPY W/ RETROGRADES Left 06/15/2019   Procedure: CYSTOSCOPY WITH RETROGRADE PYELOGRAM;  Surgeon: Abbie Sons, MD;  Location: [facility];  Service: Urology;  Laterality: Left;  . CYSTOSCOPY/URETEROSCOPY/HOLMIUM LASER/STENT PLACEMENT Left 06/15/2019   Procedure: CYSTOSCOPY/URETEROSCOPY/HOLMIUM LASER/STENT PLACEMENT;  Surgeon: Abbie Sons, MD;  Location: [facility];  Service:  Urology;  Laterality: Left;  . ESOPHAGECTOMY  10/07/2016   @ [facility]  . ESOPHAGOGASTRODUODENOSCOPY (EGD) WITH PROPOFOL N/A 06/14/2016   Procedure: ESOPHAGOGASTRODUODENOSCOPY (EGD) WITH PROPOFOL;  Surgeon: [name], MD;  Location: [facility];  Service: Endoscopy;  Laterality: N/A;  . HERNIA REPAIR Bilateral    x3  . INGUINAL HERNIA REPAIR Right 08/28/2017   Large PerFix plug, recurrent hernia;  Surgeon: Robert Bellow, MD;  Location: [facility];  Service: General;  Laterality: Right;  recurrent  . KIDNEY STONE SURGERY  07/15/2019  . POLYPECTOMY  07/13/2015   Procedure: POLYPECTOMY;  Surgeon: Lucilla Lame, MD;  Location: [place];  Service: Endoscopy;;  . PORT-A-CATH REMOVAL N/A 01/16/2017   Procedure: REMOVAL PORT-A-CATH;  Surgeon: Olean Ree, MD;  Location: [facility];  Service: General;  Laterality: N/A;  . PORTACATH PLACEMENT N/A 07/08/2016   Procedure: INSERTION PORT-A-CATH;  Surgeon: Olean Ree, MD;  Location: [facility];  Service: General;  Laterality: N/A;  . PORTACATH PLACEMENT Right 08/30/2019   Procedure: INSERTION PORT-A-CATH;  Surgeon: Olean Ree, MD;  Location: [facility];  Service: General;  Laterality: Right;    FAMILY HISTORY Family History  Problem Relation Age of Onset  . Hypertension Mother   . Dementia Mother   . Heart disease Father   . Hypertension Father   . Kidney disease Father   . Hypertension Brother   . Heart disease Brother   . Diabetes Son   . Hypertension Son   . Hyperlipidemia Son   . Hyperlipidemia Daughter   . Hypertension Daughter        ADVANCED DIRECTIVES:    HEALTH MAINTENANCE: Social History   Tobacco Use  . Smoking status: Former Smoker    Packs/day: 1.00    Years: 40.00    Pack years: 40.00    Types: Cigarettes    Quit date: 07/16/2004    Years since quitting: 15.3  . Smokeless tobacco: Former Systems developer    Types: Chew  Substance Use Topics  . Alcohol use: Yes    Alcohol/week: 1.0 standard drinks    Types:  1 Cans of beer per week  . Drug use: No     Colonoscopy:  PAP:  Bone density:  Lipid panel:  No Known Allergies  Current Outpatient Medications  Medication Sig Dispense Refill  . acetaminophen (TYLENOL) 500 MG tablet Take 500 mg by mouth every 6 (six) hours as needed for mild pain or moderate pain.    Marland Kitchen aspirin 81 MG tablet Take 81 mg by mouth daily.     Marland Kitchen atorvastatin (LIPITOR) 40 MG tablet Take 1 tablet (40 mg total) by mouth daily. 90 tablet 1  . clopidogrel (PLAVIX) 75 MG tablet Take 1 tablet (75 mg total) by mouth daily. 90 tablet 2  . diphenoxylate-atropine (LOMOTIL) 2.5-0.025 MG tablet Take 2 tablets by mouth 4 (four) times daily as needed for diarrhea or loose stools. 30 tablet 0  . isosorbide mononitrate (IMDUR) 30 MG 24 hr tablet Take 1 tablet (30 mg total) by mouth every evening. 90 tablet 1  . levothyroxine (SYNTHROID) 50 MCG tablet Take 1 tablet (50 mcg total) by mouth daily. 90 tablet 1  . lisinopril (ZESTRIL) 2.5 MG tablet Take 1 tablet (2.5 mg total) by mouth every  morning. 90 tablet 1  . loperamide (IMODIUM A-D) 2 MG tablet Take 2 mg by mouth 4 (four) times daily as needed for diarrhea or loose stools.    . metoprolol succinate (TOPROL-XL) 25 MG 24 hr tablet Take 25 mg by mouth daily.    . multivitamin-iron-minerals-folic acid (CENTRUM) chewable tablet Chew 1 tablet by mouth daily.    . nitroGLYCERIN (NITROSTAT) 0.4 MG SL tablet Place 1 tablet under the tongue every 5 (five) minutes as needed for chest pain.   98  . ondansetron (ZOFRAN) 8 MG tablet Take 1 tablet (8 mg total) by mouth 2 (two) times daily as needed for refractory nausea / vomiting. Start on day 3 after chemotherapy. 60 tablet 2  . pantoprazole (PROTONIX) 20 MG tablet Take 1 tablet (20 mg total) by mouth daily. 90 tablet 0  . prochlorperazine (COMPAZINE) 10 MG tablet Take 1 tablet (10 mg total) by mouth every 6 (six) hours as needed (Nausea or vomiting). 60 tablet 2   No current facility-administered  medications for this visit.   Facility-Administered Medications Ordered in Other Visits  Medication Dose Route Frequency Provider Last Rate Last Admin  . fluorouracil (ADRUCIL) 4,200 mg in sodium chloride 0.9 % 66 mL chemo infusion  2,400 mg/m2 (Treatment Plan Recorded) Intravenous 1 day or 1 dose [name], MD   4,200 mg at 11/24/19 1240  . heparin lock flush 100 unit/mL  500 Units Intravenous Once [name], MD        OBJECTIVE: Vitals:   11/24/19 0851  BP: 121/72  Pulse: (!) 58  Resp: 16  Temp: (!) 96.9 F (36.1 C)  SpO2: 100%     Body mass index is 20 kg/m.    ECOG FS:0 - Asymptomatic  General: Thin, no acute distress. Eyes: Pink conjunctiva, anicteric sclera. HEENT: Normocephalic, moist mucous membranes. Lungs: No audible wheezing or coughing. Heart: Regular rate and rhythm. Abdomen: Soft, nontender, no obvious distention. Musculoskeletal: No edema, cyanosis, or clubbing. Neuro: Alert, answering all questions appropriately. Cranial nerves grossly intact. Skin: No rashes or petechiae noted. Psych: Normal affect.  LAB RESULTS:  Lab Results  Component Value Date   NA 134 (L) 11/24/2019   K 4.1 11/24/2019   CL 107 11/24/2019   CO2 19 (L) 11/24/2019   GLUCOSE 119 (H) 11/24/2019   BUN 14 11/24/2019   CREATININE 1.26 (H) 11/24/2019   CALCIUM 8.3 (L) 11/24/2019   PROT 6.8 11/24/2019   ALBUMIN 3.4 (L) 11/24/2019   AST 19 11/24/2019   ALT 13 11/24/2019   ALKPHOS 93 11/24/2019   BILITOT 0.5 11/24/2019   GFRNONAA 56 (L) 11/24/2019   GFRAA >60 11/24/2019    Lab Results  Component Value Date   WBC 16.2 (H) 11/24/2019   NEUTROABS 14.0 (H) 11/24/2019   HGB 9.4 (L) 11/24/2019   HCT 31.5 (L) 11/24/2019   MCV 107.9 (H) 11/24/2019   PLT 123 (L) 11/24/2019     STUDIES: No results found.  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  PLAN:    1.  Recurrent adenocarcinoma of the lower third of the esophagus: Patient  initially completed 6 cycles of weekly carboplatinum and Taxol on August 21, 2016 and daily XRT completed on August 27, 2016. Patient had his esophagectomy on October 07, 2016.  His most recent imaging at [facility] suggested recurrence which was confirmed with biopsy.  Per recommendation of [facility], will proceed with palliative FOLFOX.  Awaiting final pathology results to  assess whether nivolumab will be of benefit.  Proceed with cycle 6 of FOLFOX today.  Return to clinic in 2 days for pump removal and then in 2 weeks for further evaluation and consideration of cycle 7.  Will get PET scan 1 to 2 days prior to next infusion. 2.  Diarrhea: Possibly osmotic in nature.  Diarrhea has improved since patient discontinued high fructose drinks.  Continue Lomotil as needed. 3.  Thrombocytopenia: Improved.  Proceed with treatment as above. 4.  Renal insufficiency: Improved.  Creatinine 1.26 today.  Monitor. 5.  Anemia: Chronic and unchanged.  Patient's hemoglobin has trended up to 9.4. 6.  Leukocytosis: Secondary to Udenyca. Monitor.   Patient expressed understanding and was in agreement with this plan. He also understands that He can call clinic at any time with any questions, concerns, or complaints.    [name], MD 11/24/19 2:33 PM

## 2019-11-22 ENCOUNTER — Inpatient Hospital Stay: Payer: Medicare Other

## 2019-11-22 NOTE — Progress Notes (Signed)
Nutrition  Called patient for nutrition follow-up.  No answer.  Left message with call back number.   Corey Perry B. Zenia Resides, [place], [place] Registered Dietitian [phone] (pager)

## 2019-11-24 ENCOUNTER — Other Ambulatory Visit: Payer: Self-pay

## 2019-11-24 ENCOUNTER — Inpatient Hospital Stay: Payer: Medicare Other

## 2019-11-24 ENCOUNTER — Encounter: Payer: Self-pay | Admitting: Oncology

## 2019-11-24 ENCOUNTER — Inpatient Hospital Stay (HOSPITAL_BASED_OUTPATIENT_CLINIC_OR_DEPARTMENT_OTHER): Payer: Medicare Other | Admitting: Oncology

## 2019-11-24 ENCOUNTER — Inpatient Hospital Stay (HOSPITAL_BASED_OUTPATIENT_CLINIC_OR_DEPARTMENT_OTHER): Payer: Medicare Other | Admitting: Hospice and Palliative Medicine

## 2019-11-24 VITALS — BP 121/72 | HR 58 | Temp 96.9°F | Resp 16 | Wt 135.4 lb

## 2019-11-24 DIAGNOSIS — C155 Malignant neoplasm of lower third of esophagus: Secondary | ICD-10-CM

## 2019-11-24 DIAGNOSIS — Z515 Encounter for palliative care: Secondary | ICD-10-CM

## 2019-11-24 DIAGNOSIS — Z66 Do not resuscitate: Secondary | ICD-10-CM | POA: Diagnosis not present

## 2019-11-24 DIAGNOSIS — I2510 Atherosclerotic heart disease of native coronary artery without angina pectoris: Secondary | ICD-10-CM

## 2019-11-24 DIAGNOSIS — I2583 Coronary atherosclerosis due to lipid rich plaque: Secondary | ICD-10-CM

## 2019-11-24 DIAGNOSIS — Z7189 Other specified counseling: Secondary | ICD-10-CM | POA: Diagnosis not present

## 2019-11-24 DIAGNOSIS — Z5111 Encounter for antineoplastic chemotherapy: Secondary | ICD-10-CM | POA: Diagnosis not present

## 2019-11-24 DIAGNOSIS — D696 Thrombocytopenia, unspecified: Secondary | ICD-10-CM | POA: Diagnosis not present

## 2019-11-24 DIAGNOSIS — N289 Disorder of kidney and ureter, unspecified: Secondary | ICD-10-CM | POA: Diagnosis not present

## 2019-11-24 DIAGNOSIS — D649 Anemia, unspecified: Secondary | ICD-10-CM | POA: Diagnosis not present

## 2019-11-24 LAB — CBC WITH DIFFERENTIAL/PLATELET
Abs Immature Granulocytes: 0.19 10*3/uL — ABNORMAL HIGH (ref 0.00–0.07)
Basophils Absolute: 0.1 10*3/uL (ref 0.0–0.1)
Basophils Relative: 1 %
Eosinophils Absolute: 0.2 10*3/uL (ref 0.0–0.5)
Eosinophils Relative: 2 %
HCT: 31.5 % — ABNORMAL LOW (ref 39.0–52.0)
Hemoglobin: 9.4 g/dL — ABNORMAL LOW (ref 13.0–17.0)
Immature Granulocytes: 1 %
Lymphocytes Relative: 4 %
Lymphs Abs: 0.7 10*3/uL (ref 0.7–4.0)
MCH: 32.2 pg (ref 26.0–34.0)
MCHC: 29.8 g/dL — ABNORMAL LOW (ref 30.0–36.0)
MCV: 107.9 fL — ABNORMAL HIGH (ref 80.0–100.0)
Monocytes Absolute: 1 10*3/uL (ref 0.1–1.0)
Monocytes Relative: 6 %
Neutro Abs: 14 10*3/uL — ABNORMAL HIGH (ref 1.7–7.7)
Neutrophils Relative %: 86 %
Platelets: 123 10*3/uL — ABNORMAL LOW (ref 150–400)
RBC: 2.92 MIL/uL — ABNORMAL LOW (ref 4.22–5.81)
RDW: 21 % — ABNORMAL HIGH (ref 11.5–15.5)
WBC: 16.2 10*3/uL — ABNORMAL HIGH (ref 4.0–10.5)
nRBC: 0 % (ref 0.0–0.2)

## 2019-11-24 LAB — COMPREHENSIVE METABOLIC PANEL
ALT: 13 U/L (ref 0–44)
AST: 19 U/L (ref 15–41)
Albumin: 3.4 g/dL — ABNORMAL LOW (ref 3.5–5.0)
Alkaline Phosphatase: 93 U/L (ref 38–126)
Anion gap: 8 (ref 5–15)
BUN: 14 mg/dL (ref 8–23)
CO2: 19 mmol/L — ABNORMAL LOW (ref 22–32)
Calcium: 8.3 mg/dL — ABNORMAL LOW (ref 8.9–10.3)
Chloride: 107 mmol/L (ref 98–111)
Creatinine, Ser: 1.26 mg/dL — ABNORMAL HIGH (ref 0.61–1.24)
GFR calc Af Amer: >60 mL/min (ref >60)
GFR calc non Af Amer: 56 mL/min — ABNORMAL LOW (ref >60)
Glucose, Bld: 119 mg/dL — ABNORMAL HIGH (ref 70–99)
Potassium: 4.1 mmol/L (ref 3.5–5.1)
Sodium: 134 mmol/L — ABNORMAL LOW (ref 135–145)
Total Bilirubin: 0.5 mg/dL (ref 0.3–1.2)
Total Protein: 6.8 g/dL (ref 6.5–8.1)

## 2019-11-24 MED ORDER — DEXTROSE 5 % IV SOLN
Freq: Once | INTRAVENOUS | Status: AC
  Filled 2019-11-24: qty 250

## 2019-11-24 MED ORDER — SODIUM CHLORIDE 0.9% FLUSH
10.0000 mL | Freq: Once | INTRAVENOUS | Status: AC
  Administered 2019-11-24: 10 mL via INTRAVENOUS
  Filled 2019-11-24: qty 10

## 2019-11-24 MED ORDER — PALONOSETRON HCL INJECTION 0.25 MG/5ML
0.2500 mg | Freq: Once | INTRAVENOUS | Status: AC
  Administered 2019-11-24: 0.25 mg via INTRAVENOUS
  Filled 2019-11-24: qty 5

## 2019-11-24 MED ORDER — OXALIPLATIN CHEMO INJECTION 100 MG/20ML
85.0000 mg/m2 | Freq: Once | INTRAVENOUS | Status: AC
  Administered 2019-11-24: 150 mg via INTRAVENOUS
  Filled 2019-11-24: qty 20

## 2019-11-24 MED ORDER — SODIUM CHLORIDE 0.9 % IV SOLN
2400.0000 mg/m2 | INTRAVENOUS | Status: DC
  Administered 2019-11-24: 4200 mg via INTRAVENOUS
  Filled 2019-11-24: qty 84

## 2019-11-24 MED ORDER — LEUCOVORIN CALCIUM INJECTION 350 MG
398.0000 mg/m2 | Freq: Once | INTRAVENOUS | Status: AC
  Administered 2019-11-24: 700 mg via INTRAVENOUS
  Filled 2019-11-24: qty 25

## 2019-11-24 MED ORDER — HEPARIN SOD (PORK) LOCK FLUSH 100 UNIT/ML IV SOLN
500.0000 [IU] | Freq: Once | INTRAVENOUS | Status: DC
  Filled 2019-11-24: qty 5

## 2019-11-24 MED ORDER — FLUOROURACIL CHEMO INJECTION 2.5 GM/50ML
400.0000 mg/m2 | Freq: Once | INTRAVENOUS | Status: AC
  Administered 2019-11-24: 700 mg via INTRAVENOUS
  Filled 2019-11-24: qty 14

## 2019-11-24 MED ORDER — SODIUM CHLORIDE 0.9 % IV SOLN
10.0000 mg | Freq: Once | INTRAVENOUS | Status: AC
  Administered 2019-11-24: 10 mg via INTRAVENOUS
  Filled 2019-11-24: qty 10

## 2019-11-24 NOTE — Progress Notes (Signed)
Pt here for follow up. Reports that diarrhea has been getting better. No complaints or concerns.

## 2019-11-24 NOTE — Progress Notes (Signed)
[place]  [phone] [phone]   Name: Corey Perry Date: 11/24/2019 MRN: [id number]  DOB: 21-May-1946  Patient Care Team: [name], MD as PCP - General (Family Medicine) [name], MD as PCP - Family Medicine (Family Medicine) [name], MD as Consulting Physician (Oncology) [name], MD as Referring Physician [name], MD as Consulting Physician (Cardiology)    REASON FOR CONSULTATION: Corey Perry is a 74 y.o. male with multiple medical problems including recurrent adenocarcinoma of the esophagus status post chemotherapy/XRT in 2017 with esophagectomy on 10/07/2016.  Patient has history of dysphagia with esophageal stricture requiring dilation.  He has had recent worsening dysphagia with weight loss.  Patient was found to have recurrent adenocarcinoma in the lower third of the esophagus.  Patient was referred to palliative care to help address goals and manage ongoing symptoms.   SOCIAL HISTORY:     reports that he quit smoking about 15 years ago. His smoking use included cigarettes. He has a 40.00 pack-year smoking history. He has quit using smokeless tobacco.  His smokeless tobacco use included chew. He reports current alcohol use of about 1.0 standard drinks of alcohol per week. He reports that he does not use drugs.  Patient is married and lives at home with his wife.  He has a daughter who lives in [place].  Patient had a son who died in his 75I from complications of COPD.  Patient worked in a Brewing technologist.  ADVANCE DIRECTIVES:  Does not have  CODE STATUS: DNR/DNI (DNR form completed on 11/24/19)  PAST MEDICAL HISTORY: Past Medical History:  Diagnosis Date  . Abnormal white blood cell (WBC) count    seeing Dr. [name] at [place] on 10/25  . Atrial fibrillation ([place])   . BPH (benign prostatic hypertrophy)   . Bradycardia   . CAD (coronary  artery disease)   . Dysrhythmia    bradycardia - sees Dr. Humphrey Rolls  . Esophageal cancer ([place]) 05/2016  . GERD (gastroesophageal reflux disease)   . History of kidney stones   . Hyperlipidemia   . Hypertension   . Hypothyroidism   . Kidney stone   . Myocardial infarction ([place]) 2005  . S/P angioplasty with stent 2005   x 4 vessels  . Sleep apnea    mild-NO CPAP  . Spinal stenosis    lumbar    PAST SURGICAL HISTORY:  Past Surgical History:  Procedure Laterality Date  . BACK SURGERY    . CARDIAC CATHETERIZATION Left 03/12/2016   Procedure: Left Heart Cath and Coronary Angiography;  Surgeon: [name], MD;  Location: [facility];  Service: Cardiovascular;  Laterality: Left;  . CARDIAC CATHETERIZATION N/A 03/12/2016   Procedure: Coronary Stent Intervention;  Surgeon: Yolonda Kida, MD;  Location: [facility];  Service: Cardiovascular;  Laterality: N/A;  . COLONOSCOPY WITH PROPOFOL N/A 07/13/2015   Procedure: COLONOSCOPY WITH PROPOFOL;  Surgeon: Lucilla Lame, MD;  Location: [facility];  Service: Endoscopy;  Laterality: N/A;  . CORONARY ANGIOPLASTY  10/05 and 12/05   4 DES placed  . CYSTOSCOPY W/ RETROGRADES Left 06/15/2019   Procedure: CYSTOSCOPY WITH RETROGRADE PYELOGRAM;  Surgeon: Abbie Sons, MD;  Location: [facility];  Service: Urology;  Laterality: Left;  . CYSTOSCOPY/URETEROSCOPY/HOLMIUM LASER/STENT PLACEMENT Left 06/15/2019   Procedure: CYSTOSCOPY/URETEROSCOPY/HOLMIUM LASER/STENT PLACEMENT;  Surgeon: Abbie Sons, MD;  Location: [facility];  Service: Urology;  Laterality:  Left;  . ESOPHAGECTOMY  10/07/2016   @ [facility]  . ESOPHAGOGASTRODUODENOSCOPY (EGD) WITH PROPOFOL N/A 06/14/2016   Procedure: ESOPHAGOGASTRODUODENOSCOPY (EGD) WITH PROPOFOL;  Surgeon: [name], MD;  [facility];  Service: Endoscopy;  Laterality: N/A;  . HERNIA REPAIR Bilateral    x3  . INGUINAL HERNIA REPAIR Right 08/28/2017   Large PerFix plug, recurrent  hernia;  Surgeon: Robert Bellow, MD;  Location: [facility];  Service: General;  Laterality: Right;  recurrent  . KIDNEY STONE SURGERY  07/15/2019  . POLYPECTOMY  07/13/2015   Procedure: POLYPECTOMY;  Surgeon: Lucilla Lame, MD;  Location: [place];  Service: Endoscopy;;  . PORT-A-CATH REMOVAL N/A 01/16/2017   Procedure: REMOVAL PORT-A-CATH;  Surgeon: Olean Ree, MD;  Location: [facility];  Service: General;  Laterality: N/A;  . PORTACATH PLACEMENT N/A 07/08/2016   Procedure: INSERTION PORT-A-CATH;  Surgeon: Olean Ree, MD;  Location: [facility];  Service: General;  Laterality: N/A;  . PORTACATH PLACEMENT Right 08/30/2019   Procedure: INSERTION PORT-A-CATH;  Surgeon: Olean Ree, MD;  Location: [facility];  Service: General;  Laterality: Right;    HEMATOLOGY/ONCOLOGY HISTORY:  Oncology History  Esophageal cancer ([place])  06/23/2016 Initial Diagnosis   Esophageal cancer ([place])   09/01/2019 -  Chemotherapy   The patient had palonosetron (ALOXI) injection 0.25 mg, 0.25 mg, Intravenous,  Once, 6 of 6 cycles Administration: 0.25 mg (09/01/2019), 0.25 mg (09/22/2019), 0.25 mg (11/10/2019), 0.25 mg (10/06/2019), 0.25 mg (10/20/2019) pegfilgrastim-cbqv (UDENYCA) injection 6 mg, 6 mg, Subcutaneous, Once, 4 of 4 cycles Administration: 6 mg (10/08/2019), 6 mg (10/22/2019), 6 mg (11/12/2019) leucovorin 700 mg in dextrose 5 % 250 mL infusion, 704 mg, Intravenous,  Once, 6 of 6 cycles Administration: 700 mg (09/01/2019), 700 mg (11/10/2019), 700 mg (10/06/2019), 700 mg (10/20/2019) oxaliplatin (ELOXATIN) 150 mg in dextrose 5 % 500 mL chemo infusion, 85 mg/m2 = 150 mg, Intravenous,  Once, 6 of 6 cycles Administration: 150 mg (09/01/2019), 150 mg (09/22/2019), 150 mg (11/10/2019), 150 mg (10/06/2019), 150 mg (10/20/2019) fluorouracil (ADRUCIL) chemo injection 700 mg, 400 mg/m2 = 700 mg, Intravenous,  Once, 6 of 6 cycles Administration: 700 mg (09/01/2019), 700 mg (09/22/2019), 700 mg (11/10/2019), 700 mg (10/06/2019), 700  mg (10/20/2019) fluorouracil (ADRUCIL) 4,200 mg in sodium chloride 0.9 % 66 mL chemo infusion, 2,400 mg/m2 = 4,200 mg, Intravenous, 1 Day/Dose, 6 of 6 cycles Administration: 4,200 mg (09/01/2019), 4,200 mg (09/22/2019), 4,200 mg (11/10/2019), 4,200 mg (10/06/2019), 4,200 mg (10/20/2019)  for chemotherapy treatment.      ALLERGIES:  has No Known Allergies.  MEDICATIONS:  Current Outpatient Medications  Medication Sig Dispense Refill  . acetaminophen (TYLENOL) 500 MG tablet Take 500 mg by mouth every 6 (six) hours as needed for mild pain or moderate pain.    Marland Kitchen aspirin 81 MG tablet Take 81 mg by mouth daily.     Marland Kitchen atorvastatin (LIPITOR) 40 MG tablet Take 1 tablet (40 mg total) by mouth daily. 90 tablet 1  . clopidogrel (PLAVIX) 75 MG tablet Take 1 tablet (75 mg total) by mouth daily. 90 tablet 2  . diphenoxylate-atropine (LOMOTIL) 2.5-0.025 MG tablet Take 2 tablets by mouth 4 (four) times daily as needed for diarrhea or loose stools. 30 tablet 0  . isosorbide mononitrate (IMDUR) 30 MG 24 hr tablet Take 1 tablet (30 mg total) by mouth every evening. 90 tablet 1  . levothyroxine (SYNTHROID) 50 MCG tablet Take 1 tablet (50 mcg total) by mouth daily. 90 tablet 1  . lisinopril (ZESTRIL)  2.5 MG tablet Take 1 tablet (2.5 mg total) by mouth every morning. 90 tablet 1  . loperamide (IMODIUM A-D) 2 MG tablet Take 2 mg by mouth 4 (four) times daily as needed for diarrhea or loose stools.    . metoprolol succinate (TOPROL-XL) 25 MG 24 hr tablet Take 25 mg by mouth daily.    . multivitamin-iron-minerals-folic acid (CENTRUM) chewable tablet Chew 1 tablet by mouth daily.    . nitroGLYCERIN (NITROSTAT) 0.4 MG SL tablet Place 1 tablet under the tongue every 5 (five) minutes as needed for chest pain.   98  . ondansetron (ZOFRAN) 8 MG tablet Take 1 tablet (8 mg total) by mouth 2 (two) times daily as needed for refractory nausea / vomiting. Start on day 3 after chemotherapy. 60 tablet 2  . pantoprazole (PROTONIX) 20 MG  tablet Take 1 tablet (20 mg total) by mouth daily. 90 tablet 0  . prochlorperazine (COMPAZINE) 10 MG tablet Take 1 tablet (10 mg total) by mouth every 6 (six) hours as needed (Nausea or vomiting). 60 tablet 2   No current facility-administered medications for this visit.   Facility-Administered Medications Ordered in Other Visits  Medication Dose Route Frequency Provider Last Rate Last Admin  . fluorouracil (ADRUCIL) 4,200 mg in sodium chloride 0.9 % 66 mL chemo infusion  2,400 mg/m2 (Treatment Plan Recorded) Intravenous 1 day or 1 dose [name], MD   4,200 mg at 11/24/19 1240  . heparin lock flush 100 unit/mL  500 Units Intravenous Once [name], MD        VITAL SIGNS: There were no vitals taken for this visit. There were no vitals filed for this visit.  Estimated body mass index is 20 kg/m as calculated from the following:   Height as of 11/04/19: 5\' 9"  (1.753 m).   Weight as of an earlier encounter on 11/24/19: 135 lb 6.4 oz (61.4 kg).  LABS: CBC:    Component Value Date/Time   WBC 16.2 (H) 11/24/2019 0818   HGB 9.4 (L) 11/24/2019 0818   HGB 9.8 (L) 10/25/2019 0914   HCT 31.5 (L) 11/24/2019 0818   HCT 29.4 (L) 10/25/2019 0914   PLT 123 (L) 11/24/2019 0818   PLT 172 10/25/2019 0914   MCV 107.9 (H) 11/24/2019 0818   MCV 100 (H) 10/25/2019 0914   NEUTROABS 14.0 (H) 11/24/2019 0818   NEUTROABS 27.3 (H) 10/25/2019 0914   LYMPHSABS 0.7 11/24/2019 0818   LYMPHSABS 0.6 (L) 10/25/2019 0914   MONOABS 1.0 11/24/2019 0818   EOSABS 0.2 11/24/2019 0818   EOSABS 0.0 10/25/2019 0914   BASOSABS 0.1 11/24/2019 0818   BASOSABS 0.1 10/25/2019 0914   Comprehensive Metabolic Panel:    Component Value Date/Time   NA 134 (L) 11/24/2019 0818   NA 143 10/25/2019 0914   K 4.1 11/24/2019 0818   CL 107 11/24/2019 0818   CO2 19 (L) 11/24/2019 0818   BUN 14 11/24/2019 0818   BUN 22 10/25/2019 0914   CREATININE 1.26 (H) 11/24/2019 0818   GLUCOSE 119 (H) 11/24/2019 0818    CALCIUM 8.3 (L) 11/24/2019 0818   AST 19 11/24/2019 0818   AST 31 03/15/2019 1022   ALT 13 11/24/2019 0818   ALT 28 03/15/2019 1022   ALKPHOS 93 11/24/2019 0818   BILITOT 0.5 11/24/2019 0818   BILITOT 0.5 10/25/2019 0914   PROT 6.8 11/24/2019 0818   PROT 6.3 10/25/2019 0914   ALBUMIN 3.4 (L) 11/24/2019 0818   ALBUMIN 3.5 (L) 10/25/2019  2979    RADIOGRAPHIC STUDIES: No results found.  PERFORMANCE STATUS (ECOG) : 1 - Symptomatic but completely ambulatory  Review of Systems Unless otherwise noted, a complete review of systems is negative.  Physical Exam General: NAD Pulmonary: Unlabored Extremities: no edema, no joint deformities Skin: no rashes Neurological: Weakness but otherwise nonfocal  IMPRESSION: Patient is in the infusion area.  Patient asked that we address advanced care planning today.  I did review with him ACP documents and answered all of his questions.  He says that he is comfortable taking him to the bank to be notarized and witnessed.  We discussed CODE STATUS.  Patient says clearly that he would not want to be resuscitated or have his life prolonged artificially on machines.  He tells me about the death of his son where he watched as EMS attempted to resuscitate him through CPR for about an hour.  He says that he would not want that for himself.  I completed a DNR order for him to take home.  PLAN: -Continue current prescription treatment -DNR/DNI -ACP documents reviewed -Appreciate home palliative care -Virtual visit 1 to 2 months   Patient expressed understanding and was in agreement with this plan. He also understands that He can call the clinic at any time with any questions, concerns, or complaints.     Time Total: 30 minutes  Visit consisted of counseling and education dealing with the complex and emotionally intense issues of symptom management and palliative care in the setting of serious and potentially life-threatening illness.Greater than 50%   of this time was spent counseling and coordinating care related to the above assessment and plan.  Signed by: Altha Harm, PhD, NP-C

## 2019-11-26 ENCOUNTER — Inpatient Hospital Stay: Payer: Medicare Other

## 2019-11-26 DIAGNOSIS — D649 Anemia, unspecified: Secondary | ICD-10-CM | POA: Diagnosis not present

## 2019-11-26 DIAGNOSIS — N289 Disorder of kidney and ureter, unspecified: Secondary | ICD-10-CM | POA: Diagnosis not present

## 2019-11-26 DIAGNOSIS — Z5111 Encounter for antineoplastic chemotherapy: Secondary | ICD-10-CM | POA: Diagnosis not present

## 2019-11-26 DIAGNOSIS — Z66 Do not resuscitate: Secondary | ICD-10-CM | POA: Diagnosis not present

## 2019-11-26 DIAGNOSIS — C155 Malignant neoplasm of lower third of esophagus: Secondary | ICD-10-CM

## 2019-11-26 DIAGNOSIS — D696 Thrombocytopenia, unspecified: Secondary | ICD-10-CM | POA: Diagnosis not present

## 2019-11-26 MED ORDER — HEPARIN SOD (PORK) LOCK FLUSH 100 UNIT/ML IV SOLN
500.0000 [IU] | Freq: Once | INTRAVENOUS | Status: AC | PRN
  Administered 2019-11-26: 500 [IU]
  Filled 2019-11-26: qty 5

## 2019-12-03 NOTE — Progress Notes (Signed)
Corey Perry  [phone] [phone]) [phone]  ID: Corey Perry OB: [birth year]  MR#: [id number]  OXB#:[id number]  Patient Care Team: Patient, No Pcp Per as PCP - General (General Practice) [name], MD as PCP - Family Medicine (Family Medicine) [name], MD as Consulting Physician (Oncology) [name], MD as Referring Physician [name], MD as Consulting Physician (Cardiology)  CHIEF COMPLAINT: Recurrent adenocarcinoma of the lower third of the esophagus.  INTERVAL HISTORY: Patient returns to clinic today for further evaluation and consideration of cycle 7 of FOLFOX.  He continues to have chronic weakness and fatigue, but otherwise feels well.  He does not complain of dysphagia or difficulty swallowing today.  His appetite has improved his weight has remained stable.  He has no neurologic complaints.  He denies any recent fevers or illnesses.  He denies any chest pain, shortness of breath, cough, or hemoptysis.  He denies any nausea, vomiting, or constipation.  His diarrhea is better controlled with Imodium.  He has no melena or hematochezia.  He has no urinary complaints.  Patient offers no further specific complaints today.  REVIEW OF SYSTEMS:   Review of Systems  Constitutional: Positive for malaise/fatigue. Negative for fever and weight loss.  HENT: Negative.   Respiratory: Negative.  Negative for cough and shortness of breath.   Cardiovascular: Negative.  Negative for chest pain and leg swelling.  Gastrointestinal: Negative.  Negative for abdominal pain, blood in stool, constipation, diarrhea, melena, nausea and vomiting.  Genitourinary: Negative.  Negative for frequency and urgency.  Musculoskeletal: Negative.  Negative for back pain.  Skin: Negative.  Negative for rash.  Neurological: Positive for weakness. Negative for tingling, focal weakness and headaches.  Psychiatric/Behavioral: Negative.  The patient is not  nervous/anxious and does not have insomnia.     As per HPI. Otherwise, a complete review of systems is negative.  PAST MEDICAL HISTORY: Past Medical History:  Diagnosis Date  . Abnormal white blood cell (WBC) count    seeing Dr. [name] at [place] on 10/25  . Atrial fibrillation ([place])   . BPH (benign prostatic hypertrophy)   . Bradycardia   . CAD (coronary artery disease)   . Dysrhythmia    bradycardia - sees Dr. Humphrey Rolls  . Esophageal cancer ([place]) 05/2016  . GERD (gastroesophageal reflux disease)   . History of kidney stones   . Hyperlipidemia   . Hypertension   . Hypothyroidism   . Kidney stone   . Myocardial infarction ([place]) 2005  . S/P angioplasty with stent 2005   x 4 vessels  . Sleep apnea    mild-NO CPAP  . Spinal stenosis    lumbar    PAST SURGICAL HISTORY: Past Surgical History:  Procedure Laterality Date  . BACK SURGERY    . CARDIAC CATHETERIZATION Left 03/12/2016   Procedure: Left Heart Cath and Coronary Angiography;  Surgeon: [name], MD;  Location: [place] [facility];  Service: Cardiovascular;  Laterality: Left;  . CARDIAC CATHETERIZATION N/A 03/12/2016   Procedure: Coronary Stent Intervention;  Surgeon: Yolonda Kida, MD;  Location: [place] [facility];  Service: Cardiovascular;  Laterality: N/A;  . COLONOSCOPY WITH PROPOFOL N/A 07/13/2015   Procedure: COLONOSCOPY WITH PROPOFOL;  Surgeon: Lucilla Lame, MD;  Location: [facility];  Service: Endoscopy;  Laterality: N/A;  . CORONARY ANGIOPLASTY  10/05 and 12/05   4 DES placed  . CYSTOSCOPY W/ RETROGRADES Left 06/15/2019   Procedure: CYSTOSCOPY WITH RETROGRADE PYELOGRAM;  Surgeon: Abbie Sons, MD;  Location: [facility];  Service: Urology;  Laterality: Left;  . CYSTOSCOPY/URETEROSCOPY/HOLMIUM LASER/STENT PLACEMENT Left 06/15/2019   Procedure: CYSTOSCOPY/URETEROSCOPY/HOLMIUM LASER/STENT PLACEMENT;  Surgeon: Abbie Sons, MD;  Location: [facility];  Service: Urology;  Laterality: Left;  .  ESOPHAGECTOMY  10/07/2016   @ [facility]  . ESOPHAGOGASTRODUODENOSCOPY (EGD) WITH PROPOFOL N/A 06/14/2016   Procedure: ESOPHAGOGASTRODUODENOSCOPY (EGD) WITH PROPOFOL;  Surgeon: [name], MD;  Location: [facility];  Service: Endoscopy;  Laterality: N/A;  . HERNIA REPAIR Bilateral    x3  . INGUINAL HERNIA REPAIR Right 08/28/2017   Large PerFix plug, recurrent hernia;  Surgeon: Robert Bellow, MD;  Location: [facility];  Service: General;  Laterality: Right;  recurrent  . KIDNEY STONE SURGERY  07/15/2019  . POLYPECTOMY  07/13/2015   Procedure: POLYPECTOMY;  Surgeon: Lucilla Lame, MD;  Location: [place];  Service: Endoscopy;;  . PORT-A-CATH REMOVAL N/A 01/16/2017   Procedure: REMOVAL PORT-A-CATH;  Surgeon: Olean Ree, MD;  Location: [facility];  Service: General;  Laterality: N/A;  . PORTACATH PLACEMENT N/A 07/08/2016   Procedure: INSERTION PORT-A-CATH;  Surgeon: Olean Ree, MD;  Location: [facility];  Service: General;  Laterality: N/A;  . PORTACATH PLACEMENT Right 08/30/2019   Procedure: INSERTION PORT-A-CATH;  Surgeon: Olean Ree, MD;  Location: [facility];  Service: General;  Laterality: Right;    FAMILY HISTORY Family History  Problem Relation Age of Onset  . Hypertension Mother   . Dementia Mother   . Heart disease Father   . Hypertension Father   . Kidney disease Father   . Hypertension Brother   . Heart disease Brother   . Diabetes Son   . Hypertension Son   . Hyperlipidemia Son   . Hyperlipidemia Daughter   . Hypertension Daughter        ADVANCED DIRECTIVES:    HEALTH MAINTENANCE: Social History   Tobacco Use  . Smoking status: Former Smoker    Packs/day: 1.00    Years: 40.00    Pack years: 40.00    Types: Cigarettes    Quit date: 07/16/2004    Years since quitting: 15.4  . Smokeless tobacco: Former Systems developer    Types: Chew  Substance Use Topics  . Alcohol use: Yes    Alcohol/week: 1.0 standard drinks    Types: 1 Cans of beer per week  . Drug  use: No     Colonoscopy:  PAP:  Bone density:  Lipid panel:  No Known Allergies  Current Outpatient Medications  Medication Sig Dispense Refill  . acetaminophen (TYLENOL) 500 MG tablet Take 500 mg by mouth every 6 (six) hours as needed for mild pain or moderate pain.    Marland Kitchen aspirin 81 MG tablet Take 81 mg by mouth daily.     Marland Kitchen atorvastatin (LIPITOR) 40 MG tablet Take 1 tablet (40 mg total) by mouth daily. 90 tablet 1  . clopidogrel (PLAVIX) 75 MG tablet Take 1 tablet (75 mg total) by mouth daily. 90 tablet 2  . diphenoxylate-atropine (LOMOTIL) 2.5-0.025 MG tablet Take 2 tablets by mouth 4 (four) times daily as needed for diarrhea or loose stools. 30 tablet 0  . isosorbide mononitrate (IMDUR) 30 MG 24 hr tablet Take 1 tablet (30 mg total) by mouth every evening. 90 tablet 1  . levothyroxine (SYNTHROID) 50 MCG tablet Take 1 tablet (50 mcg total) by mouth daily. 90 tablet 1  . lisinopril (ZESTRIL) 2.5 MG tablet Take 1 tablet (2.5 mg total) by mouth every  morning. 90 tablet 1  . loperamide (IMODIUM A-D) 2 MG tablet Take 2 mg by mouth 4 (four) times daily as needed for diarrhea or loose stools.    . metoprolol succinate (TOPROL-XL) 25 MG 24 hr tablet Take 25 mg by mouth daily.    . multivitamin-iron-minerals-folic acid (CENTRUM) chewable tablet Chew 1 tablet by mouth daily.    . nitroGLYCERIN (NITROSTAT) 0.4 MG SL tablet Place 1 tablet under the tongue every 5 (five) minutes as needed for chest pain.   98  . ondansetron (ZOFRAN) 8 MG tablet Take 1 tablet (8 mg total) by mouth 2 (two) times daily as needed for refractory nausea / vomiting. Start on day 3 after chemotherapy. 60 tablet 2  . pantoprazole (PROTONIX) 20 MG tablet Take 1 tablet (20 mg total) by mouth daily. 90 tablet 0  . prochlorperazine (COMPAZINE) 10 MG tablet Take 1 tablet (10 mg total) by mouth every 6 (six) hours as needed (Nausea or vomiting). 60 tablet 2   No current facility-administered medications for this visit.     OBJECTIVE: Vitals:   12/08/19 0847  BP: 127/67  Pulse: (!) 56  Resp: 17  Temp: (!) 97.1 F (36.2 C)  SpO2: 100%     Body mass index is 19.79 kg/m.    ECOG FS:1 - Symptomatic but completely ambulatory  General: Thin, no acute distress. Eyes: Pink conjunctiva, anicteric sclera. HEENT: Normocephalic, moist mucous membranes. Lungs: No audible wheezing or coughing. Heart: Regular rate and rhythm. Abdomen: Soft, nontender, no obvious distention. Musculoskeletal: No edema, cyanosis, or clubbing. Neuro: Alert, answering all questions appropriately. Cranial nerves grossly intact. Skin: No rashes or petechiae noted. Psych: Normal affect.   LAB RESULTS:  Lab Results  Component Value Date   NA 135 12/08/2019   K 3.8 12/08/2019   CL 107 12/08/2019   CO2 21 (L) 12/08/2019   GLUCOSE 123 (H) 12/08/2019   BUN 13 12/08/2019   CREATININE 1.27 (H) 12/08/2019   CALCIUM 8.3 (L) 12/08/2019   PROT 6.7 12/08/2019   ALBUMIN 3.5 12/08/2019   AST 17 12/08/2019   ALT 12 12/08/2019   ALKPHOS 65 12/08/2019   BILITOT 0.6 12/08/2019   GFRNONAA 56 (L) 12/08/2019   GFRAA >60 12/08/2019    Lab Results  Component Value Date   WBC 2.1 (L) 12/08/2019   NEUTROABS 1.0 (L) 12/08/2019   HGB 8.6 (L) 12/08/2019   HCT 27.7 (L) 12/08/2019   MCV 105.3 (H) 12/08/2019   PLT 98 (L) 12/08/2019     STUDIES: NM PET Image Restag (PS) Skull Base To Thigh  Result Date: 12/07/2019 CLINICAL DATA:  Subsequent treatment strategy for esophageal cancer recurrence. He was initially treated in 2018 with esophagectomy following neoadjuvant chemo radiotherapy. Most recent imaging was suggestive of recurrence, confirmed with biopsy. History of chemotherapy reportedly on cycle 6 of chemotherapy. EXAM: NUCLEAR MEDICINE PET SKULL BASE TO THIGH TECHNIQUE: 7.75 mCi F-18 FDG was injected intravenously. Full-ring PET imaging was performed from the skull base to thigh after the radiotracer. CT data was obtained and used for  attenuation correction and anatomic localization. Fasting blood glucose: 97 mg/dl COMPARISON:  October of 2017 and imaging from August of 2020 FINDINGS: Mediastinal blood pool activity: SUV max 1.94 Liver activity: SUV max NA NECK: No hypermetabolic lymph nodes in the neck. Incidental CT findings: Calcified atherosclerosis of the carotid arteries bilaterally worse on the left. CHEST: Nodules in the right upper lobe showing increased FDG uptake in the dependent portion of the right upper  lobe (image 86, series 3) discrete areas of nodularity 1 measuring 10 mm the other measuring 9 mm. (SUVmax = 1.9) areas of tree-in-bud opacity seen in the superior segment of the right lower lobe and also in the upper lobe inferior to the larger areas of nodularity. A third area of nodularity is demonstrated measuring 10 mm (image 91, series 3) (SUVmax = 2.3) Grouped nodules are also present in the anterior right middle lobe (image 116, series 3) also displaying low level uptake similar to or less than blood pool. Mild increased FDG uptake noted along site of Port-A-Cath insertion. Scattered ground-glass opacities in the inferior left chest and areas of nodularity which are ill-defined but display more increased FDG uptake than the right-sided findings (SUVmax = 3.1) geographic area in the left lower lobe corresponding to this area measures approximately 5 x 3.2 cm (image 129, series 3) no signs of pleural effusion. Airways are patent. Incidental CT findings: Right Port-A-Cath terminates in the proximal superior vena cava. Calcified coronary artery disease. Low-attenuation cardiac blood pool. No pericardial effusion. Limited assessment of cardiac structures due to lack of contrast. Post esophagectomy and gastric pull-through with extensive debris in the intrathoracic stomach. ABDOMEN/PELVIS: Distal stomach is contiguous with abnormal soft tissue in the upper abdomen, better displayed on imaging from August of 2020 (SUVmax = 3.3. Soft  tissue is inseparable from the pancreas and obscures the celiac and SMA origins on this noncontrast evaluation. Scattered areas of more pronounced FDG uptake are noted, potentially related to bowel or gastric activity based on the pattern. Bandlike area across the liver margin also likely related to adjacent colonic activity. ) . Incidental CT findings: No focal hepatic lesions seen on CT images. Pancreas is inseparable from soft tissue in the upper abdomen. Soft tissue is morphologically quite similar to the previous exam measuring approximately 2 cm greatest thickness just below the left diaphragmatic crus as compared to 2.5 cm in greatest thickness on the previous study (image 151, series 3) Intra-aortocaval nodal tissue also similar to previous imaging without uptake beyond blood pool Spleen is normal size. Adrenal glands are normal. Nonobstructing nephrolithiasis on the left with interpolar calculus measuring 8 mm and a lower pole calculus measuring 6 mm. The renal pelvic calculus that was seen previously is no longer visualized. No sign of hydronephrosis. No evidence of bowel obstruction though there are stool filled loops of bowel in the lower pelvis and small volume free fluid in the pelvis. Free fluid was present previously. The long segment colonic thickening likely reflects diverticular changes. Abdominal aortic aneurysm at 3 by 3.1 cm similar to the previous study SKELETON: No focal hypermetabolic activity to suggest skeletal metastasis. Incidental CT findings: Spinal degenerative change. No acute or destructive bone process. Findings of avascular necrosis of the bilateral femoral heads. IMPRESSION: 1. Confluent soft tissue the level of the celiac axis surrounding the aorta, abutting the distal stomach and inseparable from pancreas on today's study showing mild increased FDG uptake in the area of the patient's known esophageal cancer recurrence. 2. Above findings may be slightly smaller than on the  previous exam and show minimal uptake which may reflect post treatment changes though are seen on today's study without a baseline PET in terms recurrence prior to therapy. 3. Nodular changes in the right upper lobe and superior segment right lower lobe as well as more diffuse changes in the left lower lobe suspicious for pneumonitis, perhaps related to infection or aspiration. Minimal FDG uptake within the discrete nodular foci  and more profound uptake in the left lower lobe. Correlate with any respiratory symptoms or recent infection, suggest short interval follow-up CT, within 6-12 weeks, for further assessment. 4. Small free fluid in the pelvis and some thickened bowel loops, correlate with any clinical evidence of enteritis. Findings are similar to the prior study. Electronically Signed   By: Zetta Bills M.D.   On: 12/07/2019 14:28    ASSESSMENT: Recurrent adenocarcinoma of the lower third of the esophagus.  PLAN:    1.  Recurrent adenocarcinoma of the lower third of the esophagus: Patient initially completed 6 cycles of weekly carboplatinum and Taxol on August 21, 2016 and daily XRT completed on August 27, 2016. Patient had his esophagectomy on October 07, 2016.  PET scan results from December 07, 2019 reviewed independently and report as above with improvement of patient's overall disease burden, but likely residual malignancy.  Awaiting final pathology results to assess whether nivolumab will be of benefit.  Delay cycle 7 of FOLFOX today secondary to pancytopenia.  Return to clinic in 1 week for further evaluation and reconsideration of cycle 7.  2.  Diarrhea: Improved.  Possibly osmotic in nature.  Diarrhea has improved since patient discontinued high fructose drinks.  Continue Lomotil as needed. 3.  Thrombocytopenia: Delay treatment as above. 4.  Renal insufficiency: Chronic and unchanged.  Patient's creatinine is 1.27 today. 5.  Anemia: Patient's hemoglobin has trended down slightly to 8.6,  monitor. 6.  Leukopenia: Delay treatment as above.  Patient expressed understanding and was in agreement with this plan. He also understands that He can call clinic at any time with any questions, concerns, or complaints.    [name], MD 12/08/19 10:40 AM

## 2019-12-06 ENCOUNTER — Other Ambulatory Visit: Payer: Self-pay

## 2019-12-06 ENCOUNTER — Encounter
Admission: RE | Admit: 2019-12-06 | Discharge: 2019-12-06 | Disposition: A | Discharge status: Home / Self Care | Payer: Medicare Other | Source: Ambulatory Visit | Attending: Oncology | Admitting: Oncology

## 2019-12-06 DIAGNOSIS — C155 Malignant neoplasm of lower third of esophagus: Secondary | ICD-10-CM | POA: Insufficient documentation

## 2019-12-07 ENCOUNTER — Other Ambulatory Visit: Payer: Self-pay

## 2019-12-07 ENCOUNTER — Encounter
Admission: RE | Admit: 2019-12-07 | Discharge: 2019-12-07 | Disposition: A | Discharge status: Home / Self Care | Payer: Medicare Other | Source: Ambulatory Visit | Attending: Oncology | Admitting: Oncology

## 2019-12-07 DIAGNOSIS — C155 Malignant neoplasm of lower third of esophagus: Secondary | ICD-10-CM | POA: Diagnosis not present

## 2019-12-07 DIAGNOSIS — R918 Other nonspecific abnormal finding of lung field: Secondary | ICD-10-CM | POA: Diagnosis not present

## 2019-12-07 DIAGNOSIS — Z79899 Other long term (current) drug therapy: Secondary | ICD-10-CM | POA: Diagnosis not present

## 2019-12-07 DIAGNOSIS — C159 Malignant neoplasm of esophagus, unspecified: Secondary | ICD-10-CM | POA: Diagnosis not present

## 2019-12-07 LAB — GLUCOSE, CAPILLARY: Glucose-Capillary: 97 mg/dL (ref 70–99)

## 2019-12-07 MED ORDER — FLUDEOXYGLUCOSE F - 18 (FDG) INJECTION
7.0000 | Freq: Once | INTRAVENOUS | Status: AC | PRN
  Administered 2019-12-07: 7.75 via INTRAVENOUS

## 2019-12-08 ENCOUNTER — Inpatient Hospital Stay: Payer: Medicare Other

## 2019-12-08 ENCOUNTER — Encounter: Payer: Self-pay | Admitting: Oncology

## 2019-12-08 ENCOUNTER — Inpatient Hospital Stay (HOSPITAL_BASED_OUTPATIENT_CLINIC_OR_DEPARTMENT_OTHER): Payer: Medicare Other | Admitting: Oncology

## 2019-12-08 VITALS — BP 127/67 | HR 56 | Temp 97.1°F | Resp 17 | Wt 134.0 lb

## 2019-12-08 DIAGNOSIS — Z5111 Encounter for antineoplastic chemotherapy: Secondary | ICD-10-CM | POA: Diagnosis not present

## 2019-12-08 DIAGNOSIS — I2583 Coronary atherosclerosis due to lipid rich plaque: Secondary | ICD-10-CM | POA: Diagnosis not present

## 2019-12-08 DIAGNOSIS — Z66 Do not resuscitate: Secondary | ICD-10-CM | POA: Diagnosis not present

## 2019-12-08 DIAGNOSIS — D696 Thrombocytopenia, unspecified: Secondary | ICD-10-CM | POA: Diagnosis not present

## 2019-12-08 DIAGNOSIS — Z95828 Presence of other vascular implants and grafts: Secondary | ICD-10-CM

## 2019-12-08 DIAGNOSIS — I2510 Atherosclerotic heart disease of native coronary artery without angina pectoris: Secondary | ICD-10-CM

## 2019-12-08 DIAGNOSIS — C155 Malignant neoplasm of lower third of esophagus: Secondary | ICD-10-CM | POA: Diagnosis not present

## 2019-12-08 DIAGNOSIS — D649 Anemia, unspecified: Secondary | ICD-10-CM | POA: Diagnosis not present

## 2019-12-08 DIAGNOSIS — N289 Disorder of kidney and ureter, unspecified: Secondary | ICD-10-CM | POA: Diagnosis not present

## 2019-12-08 LAB — CBC WITH DIFFERENTIAL/PLATELET
Abs Immature Granulocytes: 0.01 10*3/uL (ref 0.00–0.07)
Basophils Absolute: 0 10*3/uL (ref 0.0–0.1)
Basophils Relative: 1 %
Eosinophils Absolute: 0.1 10*3/uL (ref 0.0–0.5)
Eosinophils Relative: 3 %
HCT: 27.7 % — ABNORMAL LOW (ref 39.0–52.0)
Hemoglobin: 8.6 g/dL — ABNORMAL LOW (ref 13.0–17.0)
Immature Granulocytes: 1 %
Lymphocytes Relative: 26 %
Lymphs Abs: 0.6 10*3/uL — ABNORMAL LOW (ref 0.7–4.0)
MCH: 32.7 pg (ref 26.0–34.0)
MCHC: 31 g/dL (ref 30.0–36.0)
MCV: 105.3 fL — ABNORMAL HIGH (ref 80.0–100.0)
Monocytes Absolute: 0.5 10*3/uL (ref 0.1–1.0)
Monocytes Relative: 22 %
Neutro Abs: 1 10*3/uL — ABNORMAL LOW (ref 1.7–7.7)
Neutrophils Relative %: 47 %
Platelets: 98 10*3/uL — ABNORMAL LOW (ref 150–400)
RBC: 2.63 MIL/uL — ABNORMAL LOW (ref 4.22–5.81)
RDW: 19.1 % — ABNORMAL HIGH (ref 11.5–15.5)
WBC: 2.1 10*3/uL — ABNORMAL LOW (ref 4.0–10.5)
nRBC: 0 % (ref 0.0–0.2)

## 2019-12-08 LAB — COMPREHENSIVE METABOLIC PANEL
ALT: 12 U/L (ref 0–44)
AST: 17 U/L (ref 15–41)
Albumin: 3.5 g/dL (ref 3.5–5.0)
Alkaline Phosphatase: 65 U/L (ref 38–126)
Anion gap: 7 (ref 5–15)
BUN: 13 mg/dL (ref 8–23)
CO2: 21 mmol/L — ABNORMAL LOW (ref 22–32)
Calcium: 8.3 mg/dL — ABNORMAL LOW (ref 8.9–10.3)
Chloride: 107 mmol/L (ref 98–111)
Creatinine, Ser: 1.27 mg/dL — ABNORMAL HIGH (ref 0.61–1.24)
GFR calc Af Amer: >60 mL/min (ref >60)
GFR calc non Af Amer: 56 mL/min — ABNORMAL LOW (ref >60)
Glucose, Bld: 123 mg/dL — ABNORMAL HIGH (ref 70–99)
Potassium: 3.8 mmol/L (ref 3.5–5.1)
Sodium: 135 mmol/L (ref 135–145)
Total Bilirubin: 0.6 mg/dL (ref 0.3–1.2)
Total Protein: 6.7 g/dL (ref 6.5–8.1)

## 2019-12-08 MED ORDER — HEPARIN SOD (PORK) LOCK FLUSH 100 UNIT/ML IV SOLN
500.0000 [IU] | Freq: Once | INTRAVENOUS | Status: AC
  Administered 2019-12-08: 500 [IU] via INTRAVENOUS
  Filled 2019-12-08: qty 5

## 2019-12-08 MED ORDER — SODIUM CHLORIDE 0.9% FLUSH
10.0000 mL | Freq: Once | INTRAVENOUS | Status: AC
  Administered 2019-12-08: 08:00:00 10 mL via INTRAVENOUS
  Filled 2019-12-08: qty 10

## 2019-12-08 NOTE — Progress Notes (Signed)
Pt here for follow up for esophageal cancer, no complaints or concerns.

## 2019-12-10 ENCOUNTER — Inpatient Hospital Stay: Payer: Medicare Other

## 2019-12-11 NOTE — Progress Notes (Signed)
[place]  [phone] [phone]  ID: [name] OB: 1946-01-03  MR#: [id number]  WNI#:[id number]  Patient Care Team: Patient, No Pcp Per as PCP - General (General Practice) [name], MD as PCP - Family Medicine (Family Medicine) [name], MD as Consulting Physician (Oncology) [name], MD as Referring Physician [name], MD as Consulting Physician (Cardiology)  CHIEF COMPLAINT: Recurrent adenocarcinoma of the lower third of the esophagus.  INTERVAL HISTORY: Patient returns to clinic today for further evaluation and reconsideration of cycle 7 of FOLFOX.  He continues to have chronic weakness and fatigue, but otherwise feels well.  He does not complain of dysphagia or difficulty swallowing today.  His appetite has improved his weight has remained stable.  He has no neurologic complaints.  He denies any recent fevers or illnesses.  He denies any chest pain, shortness of breath, cough, or hemoptysis.  He denies any nausea, vomiting, or constipation.  His diarrhea is better controlled with Imodium.  He has no melena or hematochezia.  He has no urinary complaints.  He offers no further specific complaints today.  REVIEW OF SYSTEMS:   Review of Systems  Constitutional: Positive for malaise/fatigue. Negative for fever and weight loss.  HENT: Negative.   Respiratory: Negative.  Negative for cough and shortness of breath.   Cardiovascular: Negative.  Negative for chest pain and leg swelling.  Gastrointestinal: Positive for diarrhea. Negative for abdominal pain, blood in stool, constipation, melena, nausea and vomiting.  Genitourinary: Negative.  Negative for frequency and urgency.  Musculoskeletal: Negative.  Negative for back pain.  Skin: Negative.  Negative for rash.  Neurological: Positive for weakness. Negative for tingling, focal weakness and headaches.  Psychiatric/Behavioral: Negative.  The patient is not  nervous/anxious and does not have insomnia.     As per HPI. Otherwise, a complete review of systems is negative.  PAST MEDICAL HISTORY: Past Medical History:  Diagnosis Date  . Abnormal white blood cell (WBC) count    seeing Dr. [name] at [place] on 10/25  . Atrial fibrillation ([place])   . BPH (benign prostatic hypertrophy)   . Bradycardia   . CAD (coronary artery disease)   . Dysrhythmia    bradycardia - sees Dr. Humphrey Rolls  . Esophageal cancer ([place]) 05/2016  . GERD (gastroesophageal reflux disease)   . History of kidney stones   . Hyperlipidemia   . Hypertension   . Hypothyroidism   . Kidney stone   . Myocardial infarction ([place]) 2005  . S/P angioplasty with stent 2005   x 4 vessels  . Sleep apnea    mild-NO CPAP  . Spinal stenosis    lumbar    PAST SURGICAL HISTORY: Past Surgical History:  Procedure Laterality Date  . BACK SURGERY    . CARDIAC CATHETERIZATION Left 03/12/2016   Procedure: Left Heart Cath and Coronary Angiography;  Surgeon: [name], MD;  Location: [place] [facility];  Service: Cardiovascular;  Laterality: Left;  . CARDIAC CATHETERIZATION N/A 03/12/2016   Procedure: Coronary Stent Intervention;  Surgeon: Yolonda Kida, MD;  Location: [place] [facility];  Service: Cardiovascular;  Laterality: N/A;  . COLONOSCOPY WITH PROPOFOL N/A 07/13/2015   Procedure: COLONOSCOPY WITH PROPOFOL;  Surgeon: Lucilla Lame, MD;  Location: [place] [facility];  Service: Endoscopy;  Laterality: N/A;  . CORONARY ANGIOPLASTY  10/05 and 12/05   4 DES placed  . CYSTOSCOPY W/ RETROGRADES Left 06/15/2019   Procedure: CYSTOSCOPY WITH RETROGRADE PYELOGRAM;  Surgeon: Abbie Sons, MD;  Location: [facility];  Service: Urology;  Laterality: Left;  . CYSTOSCOPY/URETEROSCOPY/HOLMIUM LASER/STENT PLACEMENT Left 06/15/2019   Procedure: CYSTOSCOPY/URETEROSCOPY/HOLMIUM LASER/STENT PLACEMENT;  Surgeon: Abbie Sons, MD;  Location: [facility];  Service: Urology;  Laterality: Left;  .  ESOPHAGECTOMY  10/07/2016   @ [facility]  . ESOPHAGOGASTRODUODENOSCOPY (EGD) WITH PROPOFOL N/A 06/14/2016   Procedure: ESOPHAGOGASTRODUODENOSCOPY (EGD) WITH PROPOFOL;  Surgeon: [name], MD;  Location: [facility];  Service: Endoscopy;  Laterality: N/A;  . HERNIA REPAIR Bilateral    x3  . INGUINAL HERNIA REPAIR Right 08/28/2017   Large PerFix plug, recurrent hernia;  Surgeon: Robert Bellow, MD;  Location: [facility];  Service: General;  Laterality: Right;  recurrent  . KIDNEY STONE SURGERY  07/15/2019  . POLYPECTOMY  07/13/2015   Procedure: POLYPECTOMY;  Surgeon: Lucilla Lame, MD;  Location: [place];  Service: Endoscopy;;  . PORT-A-CATH REMOVAL N/A 01/16/2017   Procedure: REMOVAL PORT-A-CATH;  Surgeon: Olean Ree, MD;  Location: [facility];  Service: General;  Laterality: N/A;  . PORTACATH PLACEMENT N/A 07/08/2016   Procedure: INSERTION PORT-A-CATH;  Surgeon: Olean Ree, MD;  Location: [facility];  Service: General;  Laterality: N/A;  . PORTACATH PLACEMENT Right 08/30/2019   Procedure: INSERTION PORT-A-CATH;  Surgeon: Olean Ree, MD;  Location: [facility];  Service: General;  Laterality: Right;    FAMILY HISTORY Family History  Problem Relation Age of Onset  . Hypertension Mother   . Dementia Mother   . Heart disease Father   . Hypertension Father   . Kidney disease Father   . Hypertension Brother   . Heart disease Brother   . Diabetes Son   . Hypertension Son   . Hyperlipidemia Son   . Hyperlipidemia Daughter   . Hypertension Daughter        ADVANCED DIRECTIVES:    HEALTH MAINTENANCE: Social History   Tobacco Use  . Smoking status: Former Smoker    Packs/day: 1.00    Years: 40.00    Pack years: 40.00    Types: Cigarettes    Quit date: 07/16/2004    Years since quitting: 15.4  . Smokeless tobacco: Former Systems developer    Types: Chew  Substance Use Topics  . Alcohol use: Yes    Alcohol/week: 1.0 standard drinks    Types: 1 Cans of beer per week  . Drug  use: No     Colonoscopy:  PAP:  Bone density:  Lipid panel:  No Known Allergies  Current Outpatient Medications  Medication Sig Dispense Refill  . acetaminophen (TYLENOL) 500 MG tablet Take 500 mg by mouth every 6 (six) hours as needed for mild pain or moderate pain.    Marland Kitchen aspirin 81 MG tablet Take 81 mg by mouth daily.     Marland Kitchen atorvastatin (LIPITOR) 40 MG tablet Take 1 tablet (40 mg total) by mouth daily. 90 tablet 1  . clopidogrel (PLAVIX) 75 MG tablet Take 1 tablet (75 mg total) by mouth daily. 90 tablet 2  . diphenoxylate-atropine (LOMOTIL) 2.5-0.025 MG tablet Take 2 tablets by mouth 4 (four) times daily as needed for diarrhea or loose stools. 30 tablet 0  . isosorbide mononitrate (IMDUR) 30 MG 24 hr tablet Take 1 tablet (30 mg total) by mouth every evening. 90 tablet 1  . levothyroxine (SYNTHROID) 50 MCG tablet Take 1 tablet (50 mcg total) by mouth daily. 90 tablet 1  . lisinopril (ZESTRIL) 2.5 MG tablet Take 1 tablet (2.5 mg total) by mouth every  morning. 90 tablet 1  . loperamide (IMODIUM A-D) 2 MG tablet Take 2 mg by mouth 4 (four) times daily as needed for diarrhea or loose stools.    . metoprolol succinate (TOPROL-XL) 25 MG 24 hr tablet Take 25 mg by mouth daily.    . multivitamin-iron-minerals-folic acid (CENTRUM) chewable tablet Chew 1 tablet by mouth daily.    . nitroGLYCERIN (NITROSTAT) 0.4 MG SL tablet Place 1 tablet under the tongue every 5 (five) minutes as needed for chest pain.   98  . ondansetron (ZOFRAN) 8 MG tablet Take 1 tablet (8 mg total) by mouth 2 (two) times daily as needed for refractory nausea / vomiting. Start on day 3 after chemotherapy. 60 tablet 2  . pantoprazole (PROTONIX) 20 MG tablet Take 1 tablet (20 mg total) by mouth daily. 90 tablet 0  . prochlorperazine (COMPAZINE) 10 MG tablet Take 1 tablet (10 mg total) by mouth every 6 (six) hours as needed (Nausea or vomiting). 60 tablet 2   No current facility-administered medications for this visit.    Facility-Administered Medications Ordered in Other Visits  Medication Dose Route Frequency Provider Last Rate Last Admin  . fluorouracil (ADRUCIL) 4,200 mg in sodium chloride 0.9 % 66 mL chemo infusion  2,400 mg/m2 (Treatment Plan Recorded) Intravenous 1 day or 1 dose [name], MD   4,200 mg at 12/15/19 1341    OBJECTIVE: Vitals:   12/15/19 0909  BP: 138/80  Pulse: (!) 54  Resp: 17  Temp: 98.1 F (36.7 C)  SpO2: 100%     Body mass index is 20.28 kg/m.    ECOG FS:1 - Symptomatic but completely ambulatory  General: Thin, no acute distress. Eyes: Pink conjunctiva, anicteric sclera. HEENT: Normocephalic, moist mucous membranes. Lungs: No audible wheezing or coughing. Heart: Regular rate and rhythm. Abdomen: Soft, nontender, no obvious distention. Musculoskeletal: No edema, cyanosis, or clubbing. Neuro: Alert, answering all questions appropriately. Cranial nerves grossly intact. Skin: No rashes or petechiae noted. Psych: Normal affect.   LAB RESULTS:  Lab Results  Component Value Date   NA 135 12/15/2019   K 3.8 12/15/2019   CL 108 12/15/2019   CO2 22 12/15/2019   GLUCOSE 82 12/15/2019   BUN 10 12/15/2019   CREATININE 1.71 (H) 12/15/2019   CALCIUM 7.9 (L) 12/15/2019   PROT 6.4 (L) 12/15/2019   ALBUMIN 3.2 (L) 12/15/2019   AST 18 12/15/2019   ALT 12 12/15/2019   ALKPHOS 51 12/15/2019   BILITOT 0.5 12/15/2019   GFRNONAA 39 (L) 12/15/2019   GFRAA 45 (L) 12/15/2019    Lab Results  Component Value Date   WBC 3.6 (L) 12/15/2019   NEUTROABS 2.2 12/15/2019   HGB 9.1 (L) 12/15/2019   HCT 29.2 (L) 12/15/2019   MCV 107.0 (H) 12/15/2019   PLT 130 (L) 12/15/2019     STUDIES: NM PET Image Restag (PS) Skull Base To Thigh  Result Date: 12/07/2019 CLINICAL DATA:  Subsequent treatment strategy for esophageal cancer recurrence. He was initially treated in 2018 with esophagectomy following neoadjuvant chemo radiotherapy. Most recent imaging was suggestive of  recurrence, confirmed with biopsy. History of chemotherapy reportedly on cycle 6 of chemotherapy. EXAM: NUCLEAR MEDICINE PET SKULL BASE TO THIGH TECHNIQUE: 7.75 mCi F-18 FDG was injected intravenously. Full-ring PET imaging was performed from the skull base to thigh after the radiotracer. CT data was obtained and used for attenuation correction and anatomic localization. Fasting blood glucose: 97 mg/dl COMPARISON:  October of 2017 and imaging from August of  2020 FINDINGS: Mediastinal blood pool activity: SUV max 1.94 Liver activity: SUV max NA NECK: No hypermetabolic lymph nodes in the neck. Incidental CT findings: Calcified atherosclerosis of the carotid arteries bilaterally worse on the left. CHEST: Nodules in the right upper lobe showing increased FDG uptake in the dependent portion of the right upper lobe (image 86, series 3) discrete areas of nodularity 1 measuring 10 mm the other measuring 9 mm. (SUVmax = 1.9) areas of tree-in-bud opacity seen in the superior segment of the right lower lobe and also in the upper lobe inferior to the larger areas of nodularity. A third area of nodularity is demonstrated measuring 10 mm (image 91, series 3) (SUVmax = 2.3) Grouped nodules are also present in the anterior right middle lobe (image 116, series 3) also displaying low level uptake similar to or less than blood pool. Mild increased FDG uptake noted along site of Port-A-Cath insertion. Scattered ground-glass opacities in the inferior left chest and areas of nodularity which are ill-defined but display more increased FDG uptake than the right-sided findings (SUVmax = 3.1) geographic area in the left lower lobe corresponding to this area measures approximately 5 x 3.2 cm (image 129, series 3) no signs of pleural effusion. Airways are patent. Incidental CT findings: Right Port-A-Cath terminates in the proximal superior vena cava. Calcified coronary artery disease. Low-attenuation cardiac blood pool. No pericardial  effusion. Limited assessment of cardiac structures due to lack of contrast. Post esophagectomy and gastric pull-through with extensive debris in the intrathoracic stomach. ABDOMEN/PELVIS: Distal stomach is contiguous with abnormal soft tissue in the upper abdomen, better displayed on imaging from August of 2020 (SUVmax = 3.3. Soft tissue is inseparable from the pancreas and obscures the celiac and SMA origins on this noncontrast evaluation. Scattered areas of more pronounced FDG uptake are noted, potentially related to bowel or gastric activity based on the pattern. Bandlike area across the liver margin also likely related to adjacent colonic activity. ) . Incidental CT findings: No focal hepatic lesions seen on CT images. Pancreas is inseparable from soft tissue in the upper abdomen. Soft tissue is morphologically quite similar to the previous exam measuring approximately 2 cm greatest thickness just below the left diaphragmatic crus as compared to 2.5 cm in greatest thickness on the previous study (image 151, series 3) Intra-aortocaval nodal tissue also similar to previous imaging without uptake beyond blood pool Spleen is normal size. Adrenal glands are normal. Nonobstructing nephrolithiasis on the left with interpolar calculus measuring 8 mm and a lower pole calculus measuring 6 mm. The renal pelvic calculus that was seen previously is no longer visualized. No sign of hydronephrosis. No evidence of bowel obstruction though there are stool filled loops of bowel in the lower pelvis and small volume free fluid in the pelvis. Free fluid was present previously. The long segment colonic thickening likely reflects diverticular changes. Abdominal aortic aneurysm at 3 by 3.1 cm similar to the previous study SKELETON: No focal hypermetabolic activity to suggest skeletal metastasis. Incidental CT findings: Spinal degenerative change. No acute or destructive bone process. 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 IMPRESSION: 1. Confluent soft tissue the level of the celiac axis surrounding the aorta, abutting the distal stomach and inseparable from pancreas on today's study showing mild increased FDG uptake in the area of the patient's known esophageal cancer recurrence. 2. Above findings may be slightly smaller than on the previous exam and show minimal uptake which may reflect post treatment changes though are seen  on today's study without a baseline PET in terms recurrence prior to therapy. 3. Nodular changes in the right upper lobe and superior segment right lower lobe as well as more diffuse changes in the left lower lobe suspicious for pneumonitis, perhaps related to infection or aspiration. Minimal FDG uptake within the discrete nodular foci and more profound uptake in the left lower lobe. Correlate with any respiratory symptoms or recent infection, suggest short interval follow-up CT, within 6-12 weeks, for further assessment. 4. Small free fluid in the pelvis and some thickened bowel loops, correlate with any clinical evidence of enteritis. Findings are similar to the prior study. Electronically Signed   By: Zetta Bills M.D.   On: 12/07/2019 14:28    ASSESSMENT: Recurrent adenocarcinoma of the lower third of the esophagus.  PLAN:    1.  Recurrent adenocarcinoma of the lower third of the esophagus: Patient initially completed 6 cycles of weekly carboplatinum and Taxol on August 21, 2016 and daily XRT completed on August 27, 2016. Patient had his esophagectomy on October 07, 2016.  PET scan results from December 07, 2019 reviewed independently with improvement of patient's overall disease burden, but likely residual malignancy.  Awaiting final pathology results to assess whether nivolumab will be of benefit.  Proceed with cycle 7 of FOLFOX today.  Return to clinic in 2 days for pump removal, Udenyca, and IV fluids.  Patient will then return to clinic in 2 weeks for further evaluation and  consideration of cycle 8.   2.  Diarrhea: Improved.  Possibly osmotic in nature.  Diarrhea has improved since patient discontinued high fructose drinks.  Continue Lomotil as needed. 3.  Thrombocytopenia: Improved.  Proceed with treatment as above. 4.  Renal insufficiency: Creatinine worse today.  Encouraged to continue fluid intake.  Patient also received 1 L of IV fluids with pump removal. 5.  Anemia: Hemoglobin mildly improved to 9.1. 6.  Leukopenia: Total white blood cell count improved to 3.6, proceed cautiously with treatment as above.  Udenyca as above.  Patient expressed understanding and was in agreement with this plan. He also understands that He can call clinic at any time with any questions, concerns, or complaints.    [name], MD 12/15/19 3:15 PM

## 2019-12-15 ENCOUNTER — Inpatient Hospital Stay: Payer: Medicare Other

## 2019-12-15 ENCOUNTER — Other Ambulatory Visit: Payer: Self-pay

## 2019-12-15 ENCOUNTER — Inpatient Hospital Stay (HOSPITAL_BASED_OUTPATIENT_CLINIC_OR_DEPARTMENT_OTHER): Payer: Medicare Other | Admitting: Oncology

## 2019-12-15 ENCOUNTER — Encounter: Payer: Self-pay | Admitting: Oncology

## 2019-12-15 VITALS — BP 138/80 | HR 54 | Temp 98.1°F | Resp 17 | Wt 137.3 lb

## 2019-12-15 DIAGNOSIS — D696 Thrombocytopenia, unspecified: Secondary | ICD-10-CM | POA: Diagnosis not present

## 2019-12-15 DIAGNOSIS — I2510 Atherosclerotic heart disease of native coronary artery without angina pectoris: Secondary | ICD-10-CM | POA: Diagnosis not present

## 2019-12-15 DIAGNOSIS — C155 Malignant neoplasm of lower third of esophagus: Secondary | ICD-10-CM | POA: Diagnosis not present

## 2019-12-15 DIAGNOSIS — I2583 Coronary atherosclerosis due to lipid rich plaque: Secondary | ICD-10-CM | POA: Diagnosis not present

## 2019-12-15 DIAGNOSIS — N289 Disorder of kidney and ureter, unspecified: Secondary | ICD-10-CM | POA: Diagnosis not present

## 2019-12-15 DIAGNOSIS — D649 Anemia, unspecified: Secondary | ICD-10-CM | POA: Diagnosis not present

## 2019-12-15 DIAGNOSIS — Z5111 Encounter for antineoplastic chemotherapy: Secondary | ICD-10-CM | POA: Diagnosis not present

## 2019-12-15 DIAGNOSIS — Z66 Do not resuscitate: Secondary | ICD-10-CM | POA: Diagnosis not present

## 2019-12-15 LAB — COMPREHENSIVE METABOLIC PANEL
ALT: 12 U/L (ref 0–44)
AST: 18 U/L (ref 15–41)
Albumin: 3.2 g/dL — ABNORMAL LOW (ref 3.5–5.0)
Alkaline Phosphatase: 51 U/L (ref 38–126)
Anion gap: 5 (ref 5–15)
BUN: 10 mg/dL (ref 8–23)
CO2: 22 mmol/L (ref 22–32)
Calcium: 7.9 mg/dL — ABNORMAL LOW (ref 8.9–10.3)
Chloride: 108 mmol/L (ref 98–111)
Creatinine, Ser: 1.71 mg/dL — ABNORMAL HIGH (ref 0.61–1.24)
GFR calc Af Amer: 45 mL/min — ABNORMAL LOW (ref >60)
GFR calc non Af Amer: 39 mL/min — ABNORMAL LOW (ref >60)
Glucose, Bld: 82 mg/dL (ref 70–99)
Potassium: 3.8 mmol/L (ref 3.5–5.1)
Sodium: 135 mmol/L (ref 135–145)
Total Bilirubin: 0.5 mg/dL (ref 0.3–1.2)
Total Protein: 6.4 g/dL — ABNORMAL LOW (ref 6.5–8.1)

## 2019-12-15 LAB — CBC WITH DIFFERENTIAL/PLATELET
Abs Immature Granulocytes: 0.01 10*3/uL (ref 0.00–0.07)
Basophils Absolute: 0 10*3/uL (ref 0.0–0.1)
Basophils Relative: 1 %
Eosinophils Absolute: 0.1 10*3/uL (ref 0.0–0.5)
Eosinophils Relative: 2 %
HCT: 29.2 % — ABNORMAL LOW (ref 39.0–52.0)
Hemoglobin: 9.1 g/dL — ABNORMAL LOW (ref 13.0–17.0)
Immature Granulocytes: 0 %
Lymphocytes Relative: 16 %
Lymphs Abs: 0.6 10*3/uL — ABNORMAL LOW (ref 0.7–4.0)
MCH: 33.3 pg (ref 26.0–34.0)
MCHC: 31.2 g/dL (ref 30.0–36.0)
MCV: 107 fL — ABNORMAL HIGH (ref 80.0–100.0)
Monocytes Absolute: 0.7 10*3/uL (ref 0.1–1.0)
Monocytes Relative: 21 %
Neutro Abs: 2.2 10*3/uL (ref 1.7–7.7)
Neutrophils Relative %: 60 %
Platelets: 130 10*3/uL — ABNORMAL LOW (ref 150–400)
RBC: 2.73 MIL/uL — ABNORMAL LOW (ref 4.22–5.81)
RDW: 19.9 % — ABNORMAL HIGH (ref 11.5–15.5)
WBC: 3.6 10*3/uL — ABNORMAL LOW (ref 4.0–10.5)
nRBC: 0 % (ref 0.0–0.2)

## 2019-12-15 MED ORDER — DEXTROSE 5 % IV SOLN
Freq: Once | INTRAVENOUS | Status: AC
  Filled 2019-12-15: qty 250

## 2019-12-15 MED ORDER — OXALIPLATIN CHEMO INJECTION 100 MG/20ML
85.0000 mg/m2 | Freq: Once | INTRAVENOUS | Status: AC
  Administered 2019-12-15: 150 mg via INTRAVENOUS
  Filled 2019-12-15: qty 20

## 2019-12-15 MED ORDER — SODIUM CHLORIDE 0.9 % IV SOLN
10.0000 mg | Freq: Once | INTRAVENOUS | Status: AC
  Administered 2019-12-15: 10 mg via INTRAVENOUS
  Filled 2019-12-15: qty 10

## 2019-12-15 MED ORDER — SODIUM CHLORIDE 0.9% FLUSH
10.0000 mL | Freq: Once | INTRAVENOUS | Status: AC
  Administered 2019-12-15: 10 mL via INTRAVENOUS
  Filled 2019-12-15: qty 10

## 2019-12-15 MED ORDER — PALONOSETRON HCL INJECTION 0.25 MG/5ML
0.2500 mg | Freq: Once | INTRAVENOUS | Status: AC
  Administered 2019-12-15: 0.25 mg via INTRAVENOUS
  Filled 2019-12-15: qty 5

## 2019-12-15 MED ORDER — LEUCOVORIN CALCIUM INJECTION 350 MG
398.0000 mg/m2 | Freq: Once | INTRAVENOUS | Status: AC
  Administered 2019-12-15: 700 mg via INTRAVENOUS
  Filled 2019-12-15: qty 17.5

## 2019-12-15 MED ORDER — SODIUM CHLORIDE 0.9 % IV SOLN
2400.0000 mg/m2 | INTRAVENOUS | Status: DC
  Administered 2019-12-15: 4200 mg via INTRAVENOUS
  Filled 2019-12-15: qty 84

## 2019-12-15 MED ORDER — FLUOROURACIL CHEMO INJECTION 2.5 GM/50ML
400.0000 mg/m2 | Freq: Once | INTRAVENOUS | Status: AC
  Administered 2019-12-15: 700 mg via INTRAVENOUS
  Filled 2019-12-15: qty 14

## 2019-12-15 NOTE — Progress Notes (Signed)
Pt here for follow up. No questions or concerns.

## 2019-12-15 NOTE — Progress Notes (Signed)
Per MD to continue with treatment today with creatinine at 1.71. Treatment team and pt updated.  Corey Perry [employer]

## 2019-12-17 ENCOUNTER — Telehealth: Payer: Self-pay

## 2019-12-17 ENCOUNTER — Other Ambulatory Visit: Payer: Self-pay

## 2019-12-17 ENCOUNTER — Inpatient Hospital Stay: Payer: Medicare Other | Attending: Oncology

## 2019-12-17 VITALS — BP 141/80 | HR 60 | Temp 96.3°F

## 2019-12-17 DIAGNOSIS — Z87891 Personal history of nicotine dependence: Secondary | ICD-10-CM | POA: Insufficient documentation

## 2019-12-17 DIAGNOSIS — N289 Disorder of kidney and ureter, unspecified: Secondary | ICD-10-CM | POA: Diagnosis not present

## 2019-12-17 DIAGNOSIS — Z5111 Encounter for antineoplastic chemotherapy: Secondary | ICD-10-CM | POA: Diagnosis not present

## 2019-12-17 DIAGNOSIS — I252 Old myocardial infarction: Secondary | ICD-10-CM | POA: Diagnosis not present

## 2019-12-17 DIAGNOSIS — I4891 Unspecified atrial fibrillation: Secondary | ICD-10-CM | POA: Diagnosis not present

## 2019-12-17 DIAGNOSIS — R531 Weakness: Secondary | ICD-10-CM | POA: Diagnosis not present

## 2019-12-17 DIAGNOSIS — C155 Malignant neoplasm of lower third of esophagus: Secondary | ICD-10-CM | POA: Diagnosis not present

## 2019-12-17 DIAGNOSIS — E785 Hyperlipidemia, unspecified: Secondary | ICD-10-CM | POA: Diagnosis not present

## 2019-12-17 DIAGNOSIS — N400 Enlarged prostate without lower urinary tract symptoms: Secondary | ICD-10-CM | POA: Insufficient documentation

## 2019-12-17 DIAGNOSIS — K219 Gastro-esophageal reflux disease without esophagitis: Secondary | ICD-10-CM | POA: Insufficient documentation

## 2019-12-17 DIAGNOSIS — D696 Thrombocytopenia, unspecified: Secondary | ICD-10-CM | POA: Insufficient documentation

## 2019-12-17 DIAGNOSIS — E039 Hypothyroidism, unspecified: Secondary | ICD-10-CM | POA: Insufficient documentation

## 2019-12-17 DIAGNOSIS — I2510 Atherosclerotic heart disease of native coronary artery without angina pectoris: Secondary | ICD-10-CM | POA: Diagnosis not present

## 2019-12-17 DIAGNOSIS — D72829 Elevated white blood cell count, unspecified: Secondary | ICD-10-CM | POA: Insufficient documentation

## 2019-12-17 DIAGNOSIS — Z5189 Encounter for other specified aftercare: Secondary | ICD-10-CM | POA: Insufficient documentation

## 2019-12-17 DIAGNOSIS — Z87442 Personal history of urinary calculi: Secondary | ICD-10-CM | POA: Diagnosis not present

## 2019-12-17 DIAGNOSIS — R918 Other nonspecific abnormal finding of lung field: Secondary | ICD-10-CM | POA: Insufficient documentation

## 2019-12-17 DIAGNOSIS — Z79899 Other long term (current) drug therapy: Secondary | ICD-10-CM | POA: Diagnosis not present

## 2019-12-17 DIAGNOSIS — Z9221 Personal history of antineoplastic chemotherapy: Secondary | ICD-10-CM | POA: Insufficient documentation

## 2019-12-17 DIAGNOSIS — R197 Diarrhea, unspecified: Secondary | ICD-10-CM

## 2019-12-17 DIAGNOSIS — D649 Anemia, unspecified: Secondary | ICD-10-CM | POA: Diagnosis not present

## 2019-12-17 MED ORDER — HEPARIN SOD (PORK) LOCK FLUSH 100 UNIT/ML IV SOLN
INTRAVENOUS | Status: AC
  Filled 2019-12-17: qty 5

## 2019-12-17 MED ORDER — SODIUM CHLORIDE 0.9 % IV SOLN
Freq: Once | INTRAVENOUS | Status: AC
  Filled 2019-12-17: qty 250

## 2019-12-17 MED ORDER — HEPARIN SOD (PORK) LOCK FLUSH 100 UNIT/ML IV SOLN
500.0000 [IU] | Freq: Once | INTRAVENOUS | Status: AC | PRN
  Administered 2019-12-17: 500 [IU]
  Filled 2019-12-17: qty 5

## 2019-12-17 MED ORDER — SODIUM CHLORIDE 0.9% FLUSH
10.0000 mL | INTRAVENOUS | Status: DC | PRN
  Filled 2019-12-17: qty 10

## 2019-12-17 MED ORDER — PEGFILGRASTIM-CBQV 6 MG/0.6ML ~~LOC~~ SOSY
6.0000 mg | PREFILLED_SYRINGE | Freq: Once | SUBCUTANEOUS | Status: AC
  Administered 2019-12-17: 6 mg via SUBCUTANEOUS
  Filled 2019-12-17: qty 0.6

## 2019-12-17 MED ORDER — OCTREOTIDE ACETATE 20 MG IM KIT: 20.0000 mg | PACK | Freq: Once | INTRAMUSCULAR | Status: DC

## 2019-12-17 NOTE — Telephone Encounter (Signed)
Telephone call to patients home to schedule palliative care visit.  Patient didn't answer phone.  RN left message requesting call back to schedule palliative care visit.

## 2019-12-20 ENCOUNTER — Inpatient Hospital Stay (HOSPITAL_BASED_OUTPATIENT_CLINIC_OR_DEPARTMENT_OTHER): Payer: Medicare Other | Admitting: Hospice and Palliative Medicine

## 2019-12-20 DIAGNOSIS — Z515 Encounter for palliative care: Secondary | ICD-10-CM

## 2019-12-20 NOTE — Progress Notes (Signed)
I was unable to reach patient by phone.  Voicemail left.  Will reschedule.

## 2019-12-22 ENCOUNTER — Telehealth: Payer: Self-pay

## 2019-12-22 ENCOUNTER — Ambulatory Visit: Payer: Medicare Other | Admitting: Oncology

## 2019-12-22 ENCOUNTER — Ambulatory Visit: Payer: Medicare Other

## 2019-12-22 ENCOUNTER — Other Ambulatory Visit: Payer: Medicare Other

## 2019-12-22 NOTE — Telephone Encounter (Signed)
Telephone call to schedule palliative care visit.  Wife and patient in agreement with RN making home visit 12/30/19 at 10:00 AM.

## 2019-12-22 NOTE — Progress Notes (Signed)
Pharmacist Chemotherapy Monitoring - Follow Up Assessment    I verify that I have reviewed each item in the below checklist:  . Regimen for the patient is scheduled for the appropriate day and plan matches scheduled date. Marland Kitchen Appropriate non-routine labs are ordered dependent on drug ordered. . If applicable, additional medications reviewed and ordered per protocol based on lifetime cumulative doses and/or treatment regimen.   Plan for follow-up and/or issues identified: No . I-vent associated with next due treatment: No . MD and/or nursing notified: No  Gaylan Fauver K 12/22/2019 8:23 AM

## 2019-12-23 ENCOUNTER — Ambulatory Visit: Payer: Medicare Other | Admitting: Urology

## 2019-12-24 NOTE — Progress Notes (Signed)
[place]  Telephone:([phone]) [phone] Fax:(336) [phone]  ID: [name] OB: 05/22/1946  MR#: [id number]  ZJI#:[id number]  Patient Care Team: Patient, No Pcp Per as PCP - General (General Practice) [name], MD as PCP - Family Medicine (Family Medicine) [name], MD as Consulting Physician (Oncology) [name], MD as Referring Physician [name], MD as Consulting Physician (Cardiology)  CHIEF COMPLAINT: Recurrent adenocarcinoma of the lower third of the esophagus.  INTERVAL HISTORY: Patient returns to clinic today for further evaluation and consideration of cycle 8 of FOLFOX.  He continues to complain of chronic weakness and fatigue, but otherwise feels well and is tolerating his treatments.  He does not complain of dysphagia or difficulty swallowing today.  His appetite has improved his weight has remained stable.  He has no neurologic complaints.  He denies any recent fevers or illnesses.  He denies any chest pain, shortness of breath, cough, or hemoptysis.  He denies any nausea, vomiting, or constipation.  His diarrhea is better controlled with Imodium.  He has no melena or hematochezia.  He has no urinary complaints.  Patient offers no further specific complaints today.  REVIEW OF SYSTEMS:   Review of Systems  Constitutional: Positive for malaise/fatigue. Negative for fever and weight loss.  HENT: Negative.   Respiratory: Negative.  Negative for cough and shortness of breath.   Cardiovascular: Negative.  Negative for chest pain and leg swelling.  Gastrointestinal: Positive for diarrhea. Negative for abdominal pain, blood in stool, constipation, melena, nausea and vomiting.  Genitourinary: Negative.  Negative for frequency and urgency.  Musculoskeletal: Negative.  Negative for back pain.  Skin: Negative.  Negative for rash.  Neurological: Positive for weakness. Negative for tingling, focal weakness and headaches.   Psychiatric/Behavioral: Negative.  The patient is not nervous/anxious and does not have insomnia.     As per HPI. Otherwise, a complete review of systems is negative.  PAST MEDICAL HISTORY: Past Medical History:  Diagnosis Date  . Abnormal white blood cell (WBC) count    seeing Dr. [name] at [facility] on 10/25  . Atrial fibrillation ([place])   . BPH (benign prostatic hypertrophy)   . Bradycardia   . CAD (coronary artery disease)   . Dysrhythmia    bradycardia - sees Dr. Humphrey Rolls  . Esophageal cancer ([place]) 05/2016  . GERD (gastroesophageal reflux disease)   . History of kidney stones   . Hyperlipidemia   . Hypertension   . Hypothyroidism   . Kidney stone   . Myocardial infarction ([place]) 2005  . S/P angioplasty with stent 2005   x 4 vessels  . Sleep apnea    mild-NO CPAP  . Spinal stenosis    lumbar    PAST SURGICAL HISTORY: Past Surgical History:  Procedure Laterality Date  . BACK SURGERY    . CARDIAC CATHETERIZATION Left 03/12/2016   Procedure: Left Heart Cath and Coronary Angiography;  Surgeon: [name], MD;  Location: [facility];  Service: Cardiovascular;  Laterality: Left;  . CARDIAC CATHETERIZATION N/A 03/12/2016   Procedure: Coronary Stent Intervention;  Surgeon: Yolonda Kida, MD;  Location: [facility];  Service: Cardiovascular;  Laterality: N/A;  . COLONOSCOPY WITH PROPOFOL N/A 07/13/2015   Procedure: COLONOSCOPY WITH PROPOFOL;  Surgeon: Lucilla Lame, MD;  Location: [facility];  Service: Endoscopy;  Laterality: N/A;  . CORONARY ANGIOPLASTY  10/05 and 12/05   4 DES placed  . CYSTOSCOPY W/ RETROGRADES Left 06/15/2019  Procedure: CYSTOSCOPY WITH RETROGRADE PYELOGRAM;  Surgeon: Abbie Sons, MD;  Location: [facility];  Service: Urology;  Laterality: Left;  . CYSTOSCOPY/URETEROSCOPY/HOLMIUM LASER/STENT PLACEMENT Left 06/15/2019   Procedure: CYSTOSCOPY/URETEROSCOPY/HOLMIUM LASER/STENT PLACEMENT;  Surgeon: Abbie Sons, MD;  Location:  [facility];  Service: Urology;  Laterality: Left;  . ESOPHAGECTOMY  10/07/2016   @ [facility]  . ESOPHAGOGASTRODUODENOSCOPY (EGD) WITH PROPOFOL N/A 06/14/2016   Procedure: ESOPHAGOGASTRODUODENOSCOPY (EGD) WITH PROPOFOL;  Surgeon: [name], MD;  Location: [facility];  Service: Endoscopy;  Laterality: N/A;  . HERNIA REPAIR Bilateral    x3  . INGUINAL HERNIA REPAIR Right 08/28/2017   Large PerFix plug, recurrent hernia;  Surgeon: Robert Bellow, MD;  Location: [facility];  Service: General;  Laterality: Right;  recurrent  . KIDNEY STONE SURGERY  07/15/2019  . POLYPECTOMY  07/13/2015   Procedure: POLYPECTOMY;  Surgeon: Lucilla Lame, MD;  Location: [facility];  Service: Endoscopy;;  . PORT-A-CATH REMOVAL N/A 01/16/2017   Procedure: REMOVAL PORT-A-CATH;  Surgeon: Olean Ree, MD;  Location: [facility];  Service: General;  Laterality: N/A;  . PORTACATH PLACEMENT N/A 07/08/2016   Procedure: INSERTION PORT-A-CATH;  Surgeon: Olean Ree, MD;  Location: [facility];  Service: General;  Laterality: N/A;  . PORTACATH PLACEMENT Right 08/30/2019   Procedure: INSERTION PORT-A-CATH;  Surgeon: Olean Ree, MD;  Location: [facility];  Service: General;  Laterality: Right;    FAMILY HISTORY Family History  Problem Relation Age of Onset  . Hypertension Mother   . Dementia Mother   . Heart disease Father   . Hypertension Father   . Kidney disease Father   . Hypertension Brother   . Heart disease Brother   . Diabetes Son   . Hypertension Son   . Hyperlipidemia Son   . Hyperlipidemia Daughter   . Hypertension Daughter        ADVANCED DIRECTIVES:    HEALTH MAINTENANCE: Social History   Tobacco Use  . Smoking status: Former Smoker    Packs/day: 1.00    Years: 40.00    Pack years: 40.00    Types: Cigarettes    Quit date: 07/16/2004    Years since quitting: 15.4  . Smokeless tobacco: Former Systems developer    Types: Chew  Substance Use Topics  . Alcohol use: Yes    Alcohol/week: 1.0  standard drinks    Types: 1 Cans of beer per week  . Drug use: No     Colonoscopy:  PAP:  Bone density:  Lipid panel:  No Known Allergies  Current Outpatient Medications  Medication Sig Dispense Refill  . acetaminophen (TYLENOL) 500 MG tablet Take 500 mg by mouth every 6 (six) hours as needed for mild pain or moderate pain.    Marland Kitchen aspirin 81 MG tablet Take 81 mg by mouth daily.     Marland Kitchen atorvastatin (LIPITOR) 40 MG tablet Take 1 tablet (40 mg total) by mouth daily. 90 tablet 1  . clopidogrel (PLAVIX) 75 MG tablet Take 1 tablet (75 mg total) by mouth daily. 90 tablet 2  . diphenoxylate-atropine (LOMOTIL) 2.5-0.025 MG tablet Take 2 tablets by mouth 4 (four) times daily as needed for diarrhea or loose stools. 30 tablet 0  . isosorbide mononitrate (IMDUR) 30 MG 24 hr tablet Take 1 tablet (30 mg total) by mouth every evening. 90 tablet 1  . levothyroxine (SYNTHROID) 50 MCG tablet Take 1 tablet (50 mcg total) by mouth daily. 90 tablet 1  . lisinopril (ZESTRIL) 2.5 MG tablet Take 1 tablet (  2.5 mg total) by mouth every morning. 90 tablet 1  . loperamide (IMODIUM A-D) 2 MG tablet Take 2 mg by mouth 4 (four) times daily as needed for diarrhea or loose stools.    . metoprolol succinate (TOPROL-XL) 25 MG 24 hr tablet Take 25 mg by mouth daily.    . multivitamin-iron-minerals-folic acid (CENTRUM) chewable tablet Chew 1 tablet by mouth daily.    . nitroGLYCERIN (NITROSTAT) 0.4 MG SL tablet Place 1 tablet under the tongue every 5 (five) minutes as needed for chest pain.   98  . ondansetron (ZOFRAN) 8 MG tablet Take 1 tablet (8 mg total) by mouth 2 (two) times daily as needed for refractory nausea / vomiting. Start on day 3 after chemotherapy. 60 tablet 2  . pantoprazole (PROTONIX) 20 MG tablet Take 1 tablet (20 mg total) by mouth daily. 90 tablet 0  . prochlorperazine (COMPAZINE) 10 MG tablet Take 1 tablet (10 mg total) by mouth every 6 (six) hours as needed (Nausea or vomiting). 60 tablet 2   No current  facility-administered medications for this visit.   Facility-Administered Medications Ordered in Other Visits  Medication Dose Route Frequency Provider Last Rate Last Admin  . sodium chloride flush (NS) 0.9 % injection 10 mL  10 mL Intravenous PRN [name], MD   10 mL at 12/29/19 0835    OBJECTIVE: Vitals:   12/29/19 0849  BP: 132/70  Pulse: (!) 52  Resp: 18  Temp: (!) 96.1 F (35.6 C)  SpO2: 100%     Body mass index is 20.32 kg/m.    ECOG FS:1 - Symptomatic but completely ambulatory  General: Thin, no acute distress. Eyes: Pink conjunctiva, anicteric sclera. HEENT: Normocephalic, moist mucous membranes. Lungs: No audible wheezing or coughing. Heart: Regular rate and rhythm. Abdomen: Soft, nontender, no obvious distention. Musculoskeletal: No edema, cyanosis, or clubbing. Neuro: Alert, answering all questions appropriately. Cranial nerves grossly intact. Skin: No rashes or petechiae noted. Psych: Normal affect.   LAB RESULTS:  Lab Results  Component Value Date   NA 139 12/29/2019   K 3.9 12/29/2019   CL 109 12/29/2019   CO2 23 12/29/2019   GLUCOSE 94 12/29/2019   BUN 11 12/29/2019   CREATININE 1.30 (H) 12/29/2019   CALCIUM 8.2 (L) 12/29/2019   PROT 6.6 12/29/2019   ALBUMIN 3.5 12/29/2019   AST 21 12/29/2019   ALT 14 12/29/2019   ALKPHOS 72 12/29/2019   BILITOT 0.7 12/29/2019   GFRNONAA 54 (L) 12/29/2019   GFRAA >60 12/29/2019    Lab Results  Component Value Date   WBC 11.4 (H) 12/29/2019   NEUTROABS 9.4 (H) 12/29/2019   HGB 9.1 (L) 12/29/2019   HCT 29.3 (L) 12/29/2019   MCV 110.6 (H) 12/29/2019   PLT 70 (L) 12/29/2019     STUDIES: NM PET Image Restag (PS) Skull Base To Thigh  Result Date: 12/07/2019 CLINICAL DATA:  Subsequent treatment strategy for esophageal cancer recurrence. He was initially treated in 2018 with esophagectomy following neoadjuvant chemo radiotherapy. Most recent imaging was suggestive of recurrence, confirmed with  biopsy. History of chemotherapy reportedly on cycle 6 of chemotherapy. EXAM: NUCLEAR MEDICINE PET SKULL BASE TO THIGH TECHNIQUE: 7.75 mCi F-18 FDG was injected intravenously. Full-ring PET imaging was performed from the skull base to thigh after the radiotracer. CT data was obtained and used for attenuation correction and anatomic localization. Fasting blood glucose: 97 mg/dl COMPARISON:  October of 2017 and imaging from August of 2020 FINDINGS: Mediastinal blood pool activity:  SUV max 1.94 Liver activity: SUV max NA NECK: No hypermetabolic lymph nodes in the neck. Incidental CT findings: Calcified atherosclerosis of the carotid arteries bilaterally worse on the left. CHEST: Nodules in the right upper lobe showing increased FDG uptake in the dependent portion of the right upper lobe (image 86, series 3) discrete areas of nodularity 1 measuring 10 mm the other measuring 9 mm. (SUVmax = 1.9) areas of tree-in-bud opacity seen in the superior segment of the right lower lobe and also in the upper lobe inferior to the larger areas of nodularity. A third area of nodularity is demonstrated measuring 10 mm (image 91, series 3) (SUVmax = 2.3) Grouped nodules are also present in the anterior right middle lobe (image 116, series 3) also displaying low level uptake similar to or less than blood pool. Mild increased FDG uptake noted along site of Port-A-Cath insertion. Scattered ground-glass opacities in the inferior left chest and areas of nodularity which are ill-defined but display more increased FDG uptake than the right-sided findings (SUVmax = 3.1) geographic area in the left lower lobe corresponding to this area measures approximately 5 x 3.2 cm (image 129, series 3) no signs of pleural effusion. Airways are patent. Incidental CT findings: Right Port-A-Cath terminates in the proximal superior vena cava. Calcified coronary artery disease. Low-attenuation cardiac blood pool. No pericardial effusion. Limited assessment of  cardiac structures due to lack of contrast. Post esophagectomy and gastric pull-through with extensive debris in the intrathoracic stomach. ABDOMEN/PELVIS: Distal stomach is contiguous with abnormal soft tissue in the upper abdomen, better displayed on imaging from August of 2020 (SUVmax = 3.3. Soft tissue is inseparable from the pancreas and obscures the celiac and SMA origins on this noncontrast evaluation. Scattered areas of more pronounced FDG uptake are noted, potentially related to bowel or gastric activity based on the pattern. Bandlike area across the liver margin also likely related to adjacent colonic activity. ) . Incidental CT findings: No focal hepatic lesions seen on CT images. Pancreas is inseparable from soft tissue in the upper abdomen. Soft tissue is morphologically quite similar to the previous exam measuring approximately 2 cm greatest thickness just below the left diaphragmatic crus as compared to 2.5 cm in greatest thickness on the previous study (image 151, series 3) Intra-aortocaval nodal tissue also similar to previous imaging without uptake beyond blood pool Spleen is normal size. Adrenal glands are normal. Nonobstructing nephrolithiasis on the left with interpolar calculus measuring 8 mm and a lower pole calculus measuring 6 mm. The renal pelvic calculus that was seen previously is no longer visualized. No sign of hydronephrosis. No evidence of bowel obstruction though there are stool filled loops of bowel in the lower pelvis and small volume free fluid in the pelvis. Free fluid was present previously. The long segment colonic thickening likely reflects diverticular changes. Abdominal aortic aneurysm at 3 by 3.1 cm similar to the previous study SKELETON: No focal hypermetabolic activity to suggest skeletal metastasis. Incidental CT findings: Spinal degenerative change. No acute or destructive bone process. Findings of avascular necrosis of the bilateral femoral heads. IMPRESSION: 1.  Confluent soft tissue the level of the celiac axis surrounding the aorta, abutting the distal stomach and inseparable from pancreas on today's study showing mild increased FDG uptake in the area of the patient's known esophageal cancer recurrence. 2. Above findings may be slightly smaller than on the previous exam and show minimal uptake which may reflect post treatment changes though are seen on today's study without a baseline  PET in terms recurrence prior to therapy. 3. Nodular changes in the right upper lobe and superior segment right lower lobe as well as more diffuse changes in the left lower lobe suspicious for pneumonitis, perhaps related to infection or aspiration. Minimal FDG uptake within the discrete nodular foci and more profound uptake in the left lower lobe. Correlate with any respiratory symptoms or recent infection, suggest short interval follow-up CT, within 6-12 weeks, for further assessment. 4. Small free fluid in the pelvis and some thickened bowel loops, correlate with any clinical evidence of enteritis. Findings are similar to the prior study. Electronically Signed   By: Zetta Bills M.D.   On: 12/07/2019 14:28    ASSESSMENT: Recurrent adenocarcinoma of the lower third of the esophagus.  PLAN:    1.  Recurrent adenocarcinoma of the lower third of the esophagus: Patient initially completed 6 cycles of weekly carboplatinum and Taxol on August 21, 2016 and daily XRT completed on August 27, 2016. Patient had his esophagectomy on October 07, 2016.  PET scan results from December 07, 2019 reviewed independently with improvement of patient's overall disease burden, but likely residual malignancy.  Awaiting final pathology results to assess whether nivolumab will be of benefit.  Delay cycle 8 of FOLFOX today secondary to thrombocytopenia.  Patient may only be able to receive treatment every 3 weeks at this point.  Return to clinic in 1 week for further evaluation and reconsideration of cycle  8.    2.  Diarrhea: Improved.  Possibly osmotic in nature.  Diarrhea has improved since patient discontinued high fructose drinks.  Continue Lomotil as needed. 3.  Thrombocytopenia: Delay treatment as above. 4.  Renal insufficiency: Creatinine improved to 1.3 today.  Monitor.   5.  Anemia: Chronic and unchanged. 6.  Leukocytosis: Likely secondary to Udenyca.  Monitor.  Patient expressed understanding and was in agreement with this plan. He also understands that He can call clinic at any time with any questions, concerns, or complaints.    [name], MD 12/29/19 9:46 AM

## 2019-12-28 ENCOUNTER — Encounter: Payer: Medicare Other | Admitting: Hospice and Palliative Medicine

## 2019-12-29 ENCOUNTER — Inpatient Hospital Stay: Payer: Medicare Other

## 2019-12-29 ENCOUNTER — Inpatient Hospital Stay (HOSPITAL_BASED_OUTPATIENT_CLINIC_OR_DEPARTMENT_OTHER): Payer: Medicare Other | Admitting: Oncology

## 2019-12-29 ENCOUNTER — Other Ambulatory Visit: Payer: Self-pay

## 2019-12-29 ENCOUNTER — Encounter: Payer: Self-pay | Admitting: Oncology

## 2019-12-29 VITALS — BP 132/70 | HR 52 | Temp 96.1°F | Resp 18 | Wt 137.6 lb

## 2019-12-29 DIAGNOSIS — Z5111 Encounter for antineoplastic chemotherapy: Secondary | ICD-10-CM | POA: Diagnosis not present

## 2019-12-29 DIAGNOSIS — R531 Weakness: Secondary | ICD-10-CM | POA: Diagnosis not present

## 2019-12-29 DIAGNOSIS — Z95828 Presence of other vascular implants and grafts: Secondary | ICD-10-CM

## 2019-12-29 DIAGNOSIS — I2583 Coronary atherosclerosis due to lipid rich plaque: Secondary | ICD-10-CM

## 2019-12-29 DIAGNOSIS — C155 Malignant neoplasm of lower third of esophagus: Secondary | ICD-10-CM | POA: Diagnosis not present

## 2019-12-29 DIAGNOSIS — Z5189 Encounter for other specified aftercare: Secondary | ICD-10-CM | POA: Diagnosis not present

## 2019-12-29 DIAGNOSIS — I2510 Atherosclerotic heart disease of native coronary artery without angina pectoris: Secondary | ICD-10-CM

## 2019-12-29 DIAGNOSIS — R197 Diarrhea, unspecified: Secondary | ICD-10-CM | POA: Diagnosis not present

## 2019-12-29 LAB — CBC WITH DIFFERENTIAL/PLATELET
Abs Immature Granulocytes: 0.13 10*3/uL — ABNORMAL HIGH (ref 0.00–0.07)
Basophils Absolute: 0.1 10*3/uL (ref 0.0–0.1)
Basophils Relative: 0 %
Eosinophils Absolute: 0.1 10*3/uL (ref 0.0–0.5)
Eosinophils Relative: 1 %
HCT: 29.3 % — ABNORMAL LOW (ref 39.0–52.0)
Hemoglobin: 9.1 g/dL — ABNORMAL LOW (ref 13.0–17.0)
Immature Granulocytes: 1 %
Lymphocytes Relative: 7 %
Lymphs Abs: 0.8 10*3/uL (ref 0.7–4.0)
MCH: 34.3 pg — ABNORMAL HIGH (ref 26.0–34.0)
MCHC: 31.1 g/dL (ref 30.0–36.0)
MCV: 110.6 fL — ABNORMAL HIGH (ref 80.0–100.0)
Monocytes Absolute: 0.9 10*3/uL (ref 0.1–1.0)
Monocytes Relative: 8 %
Neutro Abs: 9.4 10*3/uL — ABNORMAL HIGH (ref 1.7–7.7)
Neutrophils Relative %: 83 %
Platelets: 70 10*3/uL — ABNORMAL LOW (ref 150–400)
RBC: 2.65 MIL/uL — ABNORMAL LOW (ref 4.22–5.81)
RDW: 19 % — ABNORMAL HIGH (ref 11.5–15.5)
WBC: 11.4 10*3/uL — ABNORMAL HIGH (ref 4.0–10.5)
nRBC: 0 % (ref 0.0–0.2)

## 2019-12-29 LAB — COMPREHENSIVE METABOLIC PANEL
ALT: 14 U/L (ref 0–44)
AST: 21 U/L (ref 15–41)
Albumin: 3.5 g/dL (ref 3.5–5.0)
Alkaline Phosphatase: 72 U/L (ref 38–126)
Anion gap: 7 (ref 5–15)
BUN: 11 mg/dL (ref 8–23)
CO2: 23 mmol/L (ref 22–32)
Calcium: 8.2 mg/dL — ABNORMAL LOW (ref 8.9–10.3)
Chloride: 109 mmol/L (ref 98–111)
Creatinine, Ser: 1.3 mg/dL — ABNORMAL HIGH (ref 0.61–1.24)
GFR calc Af Amer: >60 mL/min (ref >60)
GFR calc non Af Amer: 54 mL/min — ABNORMAL LOW (ref >60)
Glucose, Bld: 94 mg/dL (ref 70–99)
Potassium: 3.9 mmol/L (ref 3.5–5.1)
Sodium: 139 mmol/L (ref 135–145)
Total Bilirubin: 0.7 mg/dL (ref 0.3–1.2)
Total Protein: 6.6 g/dL (ref 6.5–8.1)

## 2019-12-29 MED ORDER — HEPARIN SOD (PORK) LOCK FLUSH 100 UNIT/ML IV SOLN
INTRAVENOUS | Status: AC
  Filled 2019-12-29: qty 5

## 2019-12-29 MED ORDER — HEPARIN SOD (PORK) LOCK FLUSH 100 UNIT/ML IV SOLN
500.0000 [IU] | Freq: Once | INTRAVENOUS | Status: AC
  Administered 2019-12-29: 500 [IU] via INTRAVENOUS
  Filled 2019-12-29: qty 5

## 2019-12-29 MED ORDER — SODIUM CHLORIDE 0.9% FLUSH
10.0000 mL | INTRAVENOUS | Status: DC | PRN
  Administered 2019-12-29: 10 mL via INTRAVENOUS
  Filled 2019-12-29: qty 10

## 2019-12-30 ENCOUNTER — Other Ambulatory Visit: Payer: Medicare Other

## 2019-12-30 DIAGNOSIS — Z515 Encounter for palliative care: Secondary | ICD-10-CM

## 2019-12-30 NOTE — Progress Notes (Signed)
PATIENT NAME: Corey Perry DOB: 01/30/1946 MRN: [id number]  PRIMARY CARE PROVIDER: Patient, No Pcp Per  RESPONSIBLE PARTY:  Acct ID - Guarantor Home Phone Work Phone Relationship Acct Type  [id number] [name],* [phone]  Self P/F     [address], [place], [place] [address]    PLAN OF CARE and INTERVENTIONS:               1.  GOALS OF CARE/ ADVANCE CARE PLANNING:  Remain at home with wife and continue to receive chemo.               2.  PATIENT/CAREGIVER EDUCATION:  Education on s/s of infection, education on fall precautions, reviewed meds, support               3.  DISEASE STATUS: RN made scheduled palliative care home visit. Nurse met with patient and patients wife [name]. Patient went to the [facility] yesterday but did not receive chemo do to his platelets being low. Patient states Dr. [name] states he may restart receiving chemo 1 week and be off chemo for 2 weeks. Patient states he is scheduled to receive 6 more treatments of chemo. Patient states he received IV fluids yesterday. Patient denies having any pain but continues to complain of chronic weakness and fatigue. Patient is independent with ambulation and is able to do own personal care. Patients current weight is137 lbs. Patient sitting in recliner covered with a blanket. Patient complains of feeling nauseated the day after receiving chemo. Patient has Zofran in the home to take as needed for nausea. Patient reports he has stopped drinking soda and his diarrhea has improved since he is not drinking sugar.  Patient breath sounds are clear. Patient denies having any shortness of breath or cough.  Patient reports his appetite is fair and patient reports he does have some difficulty swallowing at times. Patient has no edema in his lower extremities. Patient reports he sleeps at night however he sleeps light and does wake frequently. Patient's heart rate 50 and regular at visit today. Patient's other vital signs stable. Patient  reports he has had no changes in current medications. Patient and wife deny having any needs or concerns at the present time. Patient and wife encouraged to contact palliative care with questions or concerns.     HISTORY OF PRESENT ILLNESS:  Patient is a 74 year old male who resides in home with wife.  Patient continues to receive chemo.  Patient is seen monthly and PRN   CODE STATUS: DNR  ADVANCED DIRECTIVES: Y MOST FORM: N PPS: 60%   PHYSICAL EXAM:   VITALS: Today's Vitals   12/30/19 1037  BP: 102/70  Pulse: (!) 50  Resp: 18  Temp: 98.8 F (37.1 C)  TempSrc: Temporal  SpO2: 98%  Weight: 137 lb (62.1 kg)  PainSc: 0-No pain    LUNGS: clear to ausculation CARDIAC: RRR Bradycardia EXTREMITIES: none edmea SKIN: Skin color, texture, tugor normal.  No rashes or lesions.  NEURO: Alert and oriented x 3       [name], RN

## 2019-12-31 ENCOUNTER — Inpatient Hospital Stay: Payer: Medicare Other

## 2019-12-31 NOTE — Progress Notes (Signed)
[place]  [phone] [phone]  ID: Corey Perry OB: 1946/07/04  MR#: [id number]  IZT#:[id number]  Patient Care Team: Patient, No Pcp Per as PCP - General (General Practice) [name], MD as PCP - Family Medicine (Family Medicine) [name], MD as Consulting Physician (Oncology) [name], MD as Referring Physician [name], MD as Consulting Physician (Cardiology)  CHIEF COMPLAINT: Recurrent adenocarcinoma of the lower third of the esophagus.  INTERVAL HISTORY: Patient returns to clinic today for further evaluation and reconsideration of cycle 8 of FOLFOX.  He continues to have chronic weakness and fatigue, but otherwise feels well.   He does not complain of dysphagia or difficulty swallowing today.  His appetite has improved his weight has remained stable.  He has no neurologic complaints.  He denies any recent fevers or illnesses.  He denies any chest pain, shortness of breath, cough, or hemoptysis.  He denies any nausea, vomiting, constipation, or diarrhea. He has no melena or hematochezia.  He has no urinary complaints.  Patient offers no further specific complaints today.  REVIEW OF SYSTEMS:   Review of Systems  Constitutional: Positive for malaise/fatigue. Negative for fever and weight loss.  HENT: Negative.   Respiratory: Negative.  Negative for cough and shortness of breath.   Cardiovascular: Negative.  Negative for chest pain and leg swelling.  Gastrointestinal: Negative.  Negative for abdominal pain, blood in stool, constipation, diarrhea, melena, nausea and vomiting.  Genitourinary: Negative.  Negative for frequency and urgency.  Musculoskeletal: Negative.  Negative for back pain.  Skin: Negative.  Negative for rash.  Neurological: Positive for weakness. Negative for tingling, focal weakness and headaches.  Psychiatric/Behavioral: Negative.  The patient is not nervous/anxious and does not have  insomnia.     As per HPI. Otherwise, a complete review of systems is negative.  PAST MEDICAL HISTORY: Past Medical History:  Diagnosis Date  . Abnormal white blood cell (WBC) count    seeing Dr. [name] at [place] on 10/25  . Atrial fibrillation ([place])   . BPH (benign prostatic hypertrophy)   . Bradycardia   . CAD (coronary artery disease)   . Dysrhythmia    bradycardia - sees Dr. Humphrey Rolls  . Esophageal cancer ([place]) 05/2016  . GERD (gastroesophageal reflux disease)   . History of kidney stones   . Hyperlipidemia   . Hypertension   . Hypothyroidism   . Kidney stone   . Myocardial infarction ([place]) 2005  . S/P angioplasty with stent 2005   x 4 vessels  . Sleep apnea    mild-NO CPAP  . Spinal stenosis    lumbar    PAST SURGICAL HISTORY: Past Surgical History:  Procedure Laterality Date  . BACK SURGERY    . CARDIAC CATHETERIZATION Left 03/12/2016   Procedure: Left Heart Cath and Coronary Angiography;  Surgeon: [name], MD;  Location: [facility];  Service: Cardiovascular;  Laterality: Left;  . CARDIAC CATHETERIZATION N/A 03/12/2016   Procedure: Coronary Stent Intervention;  Surgeon: Yolonda Kida, MD;  Location: [facility];  Service: Cardiovascular;  Laterality: N/A;  . COLONOSCOPY WITH PROPOFOL N/A 07/13/2015   Procedure: COLONOSCOPY WITH PROPOFOL;  Surgeon: Lucilla Lame, MD;  Location: [facility];  Service: Endoscopy;  Laterality: N/A;  . CORONARY ANGIOPLASTY  10/05 and 12/05   4 DES placed  . CYSTOSCOPY W/ RETROGRADES Left 06/15/2019   Procedure: CYSTOSCOPY WITH RETROGRADE PYELOGRAM;  Surgeon: Abbie Sons, MD;  Location:  [facility];  Service: Urology;  Laterality: Left;  . CYSTOSCOPY/URETEROSCOPY/HOLMIUM LASER/STENT PLACEMENT Left 06/15/2019   Procedure: CYSTOSCOPY/URETEROSCOPY/HOLMIUM LASER/STENT PLACEMENT;  Surgeon: Abbie Sons, MD;  Location: [facility];  Service: Urology;  Laterality: Left;  . ESOPHAGECTOMY  10/07/2016   @ [facility]   . ESOPHAGOGASTRODUODENOSCOPY (EGD) WITH PROPOFOL N/A 06/14/2016   Procedure: ESOPHAGOGASTRODUODENOSCOPY (EGD) WITH PROPOFOL;  Surgeon: [name], MD;  Location: [facility];  Service: Endoscopy;  Laterality: N/A;  . HERNIA REPAIR Bilateral    x3  . INGUINAL HERNIA REPAIR Right 08/28/2017   Large PerFix plug, recurrent hernia;  Surgeon: Robert Bellow, MD;  Location: [facility];  Service: General;  Laterality: Right;  recurrent  . KIDNEY STONE SURGERY  07/15/2019  . POLYPECTOMY  07/13/2015   Procedure: POLYPECTOMY;  Surgeon: Lucilla Lame, MD;  Location: [facility];  Service: Endoscopy;;  . PORT-A-CATH REMOVAL N/A 01/16/2017   Procedure: REMOVAL PORT-A-CATH;  Surgeon: Olean Ree, MD;  Location: [facility];  Service: General;  Laterality: N/A;  . PORTACATH PLACEMENT N/A 07/08/2016   Procedure: INSERTION PORT-A-CATH;  Surgeon: Olean Ree, MD;  Location: [facility];  Service: General;  Laterality: N/A;  . PORTACATH PLACEMENT Right 08/30/2019   Procedure: INSERTION PORT-A-CATH;  Surgeon: Olean Ree, MD;  Location: [facility];  Service: General;  Laterality: Right;    FAMILY HISTORY Family History  Problem Relation Age of Onset  . Hypertension Mother   . Dementia Mother   . Heart disease Father   . Hypertension Father   . Kidney disease Father   . Hypertension Brother   . Heart disease Brother   . Diabetes Son   . Hypertension Son   . Hyperlipidemia Son   . Hyperlipidemia Daughter   . Hypertension Daughter        ADVANCED DIRECTIVES:    HEALTH MAINTENANCE: Social History   Tobacco Use  . Smoking status: Former Smoker    Packs/day: 1.00    Years: 40.00    Pack years: 40.00    Types: Cigarettes    Quit date: 07/16/2004    Years since quitting: 15.4  . Smokeless tobacco: Former Systems developer    Types: Chew  Substance Use Topics  . Alcohol use: Yes    Alcohol/week: 1.0 standard drinks    Types: 1 Cans of beer per week  . Drug use: No     Colonoscopy:  PAP:   Bone density:  Lipid panel:  No Known Allergies  Current Outpatient Medications  Medication Sig Dispense Refill  . acetaminophen (TYLENOL) 500 MG tablet Take 500 mg by mouth every 6 (six) hours as needed for mild pain or moderate pain.    Marland Kitchen aspirin 81 MG tablet Take 81 mg by mouth daily.     Marland Kitchen atorvastatin (LIPITOR) 40 MG tablet Take 1 tablet (40 mg total) by mouth daily. 90 tablet 1  . clopidogrel (PLAVIX) 75 MG tablet Take 1 tablet (75 mg total) by mouth daily. 90 tablet 2  . diphenoxylate-atropine (LOMOTIL) 2.5-0.025 MG tablet Take 2 tablets by mouth 4 (four) times daily as needed for diarrhea or loose stools. 30 tablet 0  . isosorbide mononitrate (IMDUR) 30 MG 24 hr tablet Take 1 tablet (30 mg total) by mouth every evening. 90 tablet 1  . levothyroxine (SYNTHROID) 50 MCG tablet Take 1 tablet (50 mcg total) by mouth daily. 90 tablet 1  . lisinopril (ZESTRIL) 2.5 MG tablet Take 1 tablet (2.5 mg total) by mouth every morning. 90 tablet 1  . loperamide (  IMODIUM A-D) 2 MG tablet Take 2 mg by mouth 4 (four) times daily as needed for diarrhea or loose stools.    . metoprolol succinate (TOPROL-XL) 25 MG 24 hr tablet Take 25 mg by mouth daily.    . multivitamin-iron-minerals-folic acid (CENTRUM) chewable tablet Chew 1 tablet by mouth daily.    . nitroGLYCERIN (NITROSTAT) 0.4 MG SL tablet Place 1 tablet under the tongue every 5 (five) minutes as needed for chest pain.   98  . ondansetron (ZOFRAN) 8 MG tablet Take 1 tablet (8 mg total) by mouth 2 (two) times daily as needed for refractory nausea / vomiting. Start on day 3 after chemotherapy. 60 tablet 2  . pantoprazole (PROTONIX) 20 MG tablet Take 1 tablet (20 mg total) by mouth daily. 90 tablet 0  . prochlorperazine (COMPAZINE) 10 MG tablet Take 1 tablet (10 mg total) by mouth every 6 (six) hours as needed (Nausea or vomiting). 60 tablet 2   No current facility-administered medications for this visit.    OBJECTIVE: Vitals:   01/05/20 0837  BP:  118/72  Pulse: (!) 56  Resp: 16  Temp: (!) 95.5 F (35.3 C)  SpO2: 100%     Body mass index is 20.14 kg/m.    ECOG FS:1 - Symptomatic but completely ambulatory  General: Thin, no acute distress. Eyes: Pink conjunctiva, anicteric sclera. HEENT: Normocephalic, moist mucous membranes. Lungs: No audible wheezing or coughing. Heart: Regular rate and rhythm. Abdomen: Soft, nontender, no obvious distention. Musculoskeletal: No edema, cyanosis, or clubbing. Neuro: Alert, answering all questions appropriately. Cranial nerves grossly intact. Skin: No rashes or petechiae noted. Psych: Normal affect.   LAB RESULTS:  Lab Results  Component Value Date   NA 136 01/05/2020   K 4.1 01/05/2020   CL 104 01/05/2020   CO2 24 01/05/2020   GLUCOSE 135 (H) 01/05/2020   BUN 15 01/05/2020   CREATININE 1.27 (H) 01/05/2020   CALCIUM 8.4 (L) 01/05/2020   PROT 6.9 01/05/2020   ALBUMIN 3.6 01/05/2020   AST 29 01/05/2020   ALT 16 01/05/2020   ALKPHOS 64 01/05/2020   BILITOT 0.6 01/05/2020   GFRNONAA 56 (L) 01/05/2020   GFRAA >60 01/05/2020    Lab Results  Component Value Date   WBC 6.8 01/05/2020   NEUTROABS 5.1 01/05/2020   HGB 9.9 (L) 01/05/2020   HCT 31.9 (L) 01/05/2020   MCV 110.0 (H) 01/05/2020   PLT 154 01/05/2020     STUDIES: NM PET Image Restag (PS) Skull Base To Thigh  Result Date: 12/07/2019 CLINICAL DATA:  Subsequent treatment strategy for esophageal cancer recurrence. He was initially treated in 2018 with esophagectomy following neoadjuvant chemo radiotherapy. Most recent imaging was suggestive of recurrence, confirmed with biopsy. History of chemotherapy reportedly on cycle 6 of chemotherapy. EXAM: NUCLEAR MEDICINE PET SKULL BASE TO THIGH TECHNIQUE: 7.75 mCi F-18 FDG was injected intravenously. Full-ring PET imaging was performed from the skull base to thigh after the radiotracer. CT data was obtained and used for attenuation correction and anatomic localization. Fasting blood  glucose: 97 mg/dl COMPARISON:  October of 2017 and imaging from August of 2020 FINDINGS: Mediastinal blood pool activity: SUV max 1.94 Liver activity: SUV max NA NECK: No hypermetabolic lymph nodes in the neck. Incidental CT findings: Calcified atherosclerosis of the carotid arteries bilaterally worse on the left. CHEST: Nodules in the right upper lobe showing increased FDG uptake in the dependent portion of the right upper lobe (image 86, series 3) discrete areas of nodularity 1 measuring  10 mm the other measuring 9 mm. (SUVmax = 1.9) areas of tree-in-bud opacity seen in the superior segment of the right lower lobe and also in the upper lobe inferior to the larger areas of nodularity. A third area of nodularity is demonstrated measuring 10 mm (image 91, series 3) (SUVmax = 2.3) Grouped nodules are also present in the anterior right middle lobe (image 116, series 3) also displaying low level uptake similar to or less than blood pool. Mild increased FDG uptake noted along site of Port-A-Cath insertion. Scattered ground-glass opacities in the inferior left chest and areas of nodularity which are ill-defined but display more increased FDG uptake than the right-sided findings (SUVmax = 3.1) geographic area in the left lower lobe corresponding to this area measures approximately 5 x 3.2 cm (image 129, series 3) no signs of pleural effusion. Airways are patent. Incidental CT findings: Right Port-A-Cath terminates in the proximal superior vena cava. Calcified coronary artery disease. Low-attenuation cardiac blood pool. No pericardial effusion. Limited assessment of cardiac structures due to lack of contrast. Post esophagectomy and gastric pull-through with extensive debris in the intrathoracic stomach. ABDOMEN/PELVIS: Distal stomach is contiguous with abnormal soft tissue in the upper abdomen, better displayed on imaging from August of 2020 (SUVmax = 3.3. Soft tissue is inseparable from the pancreas and obscures the celiac  and SMA origins on this noncontrast evaluation. Scattered areas of more pronounced FDG uptake are noted, potentially related to bowel or gastric activity based on the pattern. Bandlike area across the liver margin also likely related to adjacent colonic activity. ) . Incidental CT findings: No focal hepatic lesions seen on CT images. Pancreas is inseparable from soft tissue in the upper abdomen. Soft tissue is morphologically quite similar to the previous exam measuring approximately 2 cm greatest thickness just below the left diaphragmatic crus as compared to 2.5 cm in greatest thickness on the previous study (image 151, series 3) Intra-aortocaval nodal tissue also similar to previous imaging without uptake beyond blood pool Spleen is normal size. Adrenal glands are normal. Nonobstructing nephrolithiasis on the left with interpolar calculus measuring 8 mm and a lower pole calculus measuring 6 mm. The renal pelvic calculus that was seen previously is no longer visualized. No sign of hydronephrosis. No evidence of bowel obstruction though there are stool filled loops of bowel in the lower pelvis and small volume free fluid in the pelvis. Free fluid was present previously. The long segment colonic thickening likely reflects diverticular changes. Abdominal aortic aneurysm at 3 by 3.1 cm similar to the previous study SKELETON: No focal hypermetabolic activity to suggest skeletal metastasis. Incidental CT findings: Spinal degenerative change. No acute or destructive bone process. Findings of avascular necrosis of the bilateral femoral heads. IMPRESSION: 1. Confluent soft tissue the level of the celiac axis surrounding the aorta, abutting the distal stomach and inseparable from pancreas on today's study showing mild increased FDG uptake in the area of the patient's known esophageal cancer recurrence. 2. Above findings may be slightly smaller than on the previous exam and show minimal uptake which may reflect post  treatment changes though are seen on today's study without a baseline PET in terms recurrence prior to therapy. 3. Nodular changes in the right upper lobe and superior segment right lower lobe as well as more diffuse changes in the left lower lobe suspicious for pneumonitis, perhaps related to infection or aspiration. Minimal FDG uptake within the discrete nodular foci and more profound uptake in the left lower lobe. Correlate with  any respiratory symptoms or recent infection, suggest short interval follow-up CT, within 6-12 weeks, for further assessment. 4. Small free fluid in the pelvis and some thickened bowel loops, correlate with any clinical evidence of enteritis. Findings are similar to the prior study. Electronically Signed   By: Zetta Bills M.D.   On: 12/07/2019 14:28    ASSESSMENT: Recurrent adenocarcinoma of the lower third of the esophagus.  PLAN:    1.  Recurrent adenocarcinoma of the lower third of the esophagus: Patient initially completed 6 cycles of weekly carboplatinum and Taxol on August 21, 2016 and daily XRT completed on August 27, 2016. Patient had his esophagectomy on October 07, 2016.  PET scan results from December 07, 2019 reviewed independently with improvement of patient's overall disease burden, but likely residual malignancy.  Proceed with cycle 8 of FOLFOX today.  Given patient's persistent thrombocytopenia, he can now only receive treatment every 3 weeks.  Return to clinic in 2 days for pump removal and Udenyca.  He will then return to clinic in 3 weeks for further evaluation and consideration of cycle 9.   2.  Diarrhea: Improved.  Possibly osmotic in nature.  Diarrhea has improved since patient discontinued high fructose drinks.  Continue Lomotil as needed. 3.  Thrombocytopenia: Proceed with treatment as above.  Patient will now receive chemotherapy every 3 weeks. 4.  Renal insufficiency: Chronic and unchanged.  Patient's creatinine is 1.27. 5.  Anemia: Chronic and  unchanged.  Hemoglobin has improved to 9.9. 6.  Leukocytosis: Resolved.  Patient expressed understanding and was in agreement with this plan. He also understands that He can call clinic at any time with any questions, concerns, or complaints.    [name], MD 01/05/20 9:26 AM

## 2020-01-05 ENCOUNTER — Other Ambulatory Visit: Payer: Self-pay

## 2020-01-05 ENCOUNTER — Inpatient Hospital Stay: Payer: Medicare Other

## 2020-01-05 ENCOUNTER — Inpatient Hospital Stay (HOSPITAL_BASED_OUTPATIENT_CLINIC_OR_DEPARTMENT_OTHER): Payer: Medicare Other | Admitting: Oncology

## 2020-01-05 ENCOUNTER — Encounter: Payer: Self-pay | Admitting: Oncology

## 2020-01-05 VITALS — BP 118/72 | HR 56 | Temp 95.5°F | Resp 16 | Wt 136.4 lb

## 2020-01-05 DIAGNOSIS — Z5111 Encounter for antineoplastic chemotherapy: Secondary | ICD-10-CM | POA: Diagnosis not present

## 2020-01-05 DIAGNOSIS — I2510 Atherosclerotic heart disease of native coronary artery without angina pectoris: Secondary | ICD-10-CM | POA: Diagnosis not present

## 2020-01-05 DIAGNOSIS — I2583 Coronary atherosclerosis due to lipid rich plaque: Secondary | ICD-10-CM

## 2020-01-05 DIAGNOSIS — R531 Weakness: Secondary | ICD-10-CM | POA: Diagnosis not present

## 2020-01-05 DIAGNOSIS — C155 Malignant neoplasm of lower third of esophagus: Secondary | ICD-10-CM

## 2020-01-05 DIAGNOSIS — Z5189 Encounter for other specified aftercare: Secondary | ICD-10-CM | POA: Diagnosis not present

## 2020-01-05 DIAGNOSIS — R197 Diarrhea, unspecified: Secondary | ICD-10-CM | POA: Diagnosis not present

## 2020-01-05 LAB — CBC WITH DIFFERENTIAL/PLATELET
Abs Immature Granulocytes: 0.04 10*3/uL (ref 0.00–0.07)
Basophils Absolute: 0 10*3/uL (ref 0.0–0.1)
Basophils Relative: 1 %
Eosinophils Absolute: 0.1 10*3/uL (ref 0.0–0.5)
Eosinophils Relative: 2 %
HCT: 31.9 % — ABNORMAL LOW (ref 39.0–52.0)
Hemoglobin: 9.9 g/dL — ABNORMAL LOW (ref 13.0–17.0)
Immature Granulocytes: 1 %
Lymphocytes Relative: 10 %
Lymphs Abs: 0.7 10*3/uL (ref 0.7–4.0)
MCH: 34.1 pg — ABNORMAL HIGH (ref 26.0–34.0)
MCHC: 31 g/dL (ref 30.0–36.0)
MCV: 110 fL — ABNORMAL HIGH (ref 80.0–100.0)
Monocytes Absolute: 0.8 10*3/uL (ref 0.1–1.0)
Monocytes Relative: 11 %
Neutro Abs: 5.1 10*3/uL (ref 1.7–7.7)
Neutrophils Relative %: 75 %
Platelets: 154 10*3/uL (ref 150–400)
RBC: 2.9 MIL/uL — ABNORMAL LOW (ref 4.22–5.81)
RDW: 18.6 % — ABNORMAL HIGH (ref 11.5–15.5)
WBC: 6.8 10*3/uL (ref 4.0–10.5)
nRBC: 0 % (ref 0.0–0.2)

## 2020-01-05 LAB — COMPREHENSIVE METABOLIC PANEL
ALT: 16 U/L (ref 0–44)
AST: 29 U/L (ref 15–41)
Albumin: 3.6 g/dL (ref 3.5–5.0)
Alkaline Phosphatase: 64 U/L (ref 38–126)
Anion gap: 8 (ref 5–15)
BUN: 15 mg/dL (ref 8–23)
CO2: 24 mmol/L (ref 22–32)
Calcium: 8.4 mg/dL — ABNORMAL LOW (ref 8.9–10.3)
Chloride: 104 mmol/L (ref 98–111)
Creatinine, Ser: 1.27 mg/dL — ABNORMAL HIGH (ref 0.61–1.24)
GFR calc Af Amer: >60 mL/min (ref >60)
GFR calc non Af Amer: 56 mL/min — ABNORMAL LOW (ref >60)
Glucose, Bld: 135 mg/dL — ABNORMAL HIGH (ref 70–99)
Potassium: 4.1 mmol/L (ref 3.5–5.1)
Sodium: 136 mmol/L (ref 135–145)
Total Bilirubin: 0.6 mg/dL (ref 0.3–1.2)
Total Protein: 6.9 g/dL (ref 6.5–8.1)

## 2020-01-05 MED ORDER — SODIUM CHLORIDE 0.9 % IV SOLN
2400.0000 mg/m2 | INTRAVENOUS | Status: DC
  Administered 2020-01-05: 4200 mg via INTRAVENOUS
  Filled 2020-01-05: qty 84

## 2020-01-05 MED ORDER — DEXTROSE 5 % IV SOLN
Freq: Once | INTRAVENOUS | Status: AC
  Filled 2020-01-05: qty 250

## 2020-01-05 MED ORDER — LEUCOVORIN CALCIUM INJECTION 350 MG
700.0000 mg | Freq: Once | INTRAVENOUS | Status: AC
  Administered 2020-01-05: 700 mg via INTRAVENOUS
  Filled 2020-01-05: qty 25

## 2020-01-05 MED ORDER — SODIUM CHLORIDE 0.9% FLUSH
10.0000 mL | Freq: Once | INTRAVENOUS | Status: AC
  Administered 2020-01-05: 10 mL via INTRAVENOUS
  Filled 2020-01-05: qty 10

## 2020-01-05 MED ORDER — PALONOSETRON HCL INJECTION 0.25 MG/5ML
0.2500 mg | Freq: Once | INTRAVENOUS | Status: AC
  Administered 2020-01-05: 0.25 mg via INTRAVENOUS
  Filled 2020-01-05: qty 5

## 2020-01-05 MED ORDER — FLUOROURACIL CHEMO INJECTION 2.5 GM/50ML
400.0000 mg/m2 | Freq: Once | INTRAVENOUS | Status: AC
  Administered 2020-01-05: 700 mg via INTRAVENOUS
  Filled 2020-01-05: qty 14

## 2020-01-05 MED ORDER — OXALIPLATIN CHEMO INJECTION 100 MG/20ML
85.0000 mg/m2 | Freq: Once | INTRAVENOUS | Status: AC
  Administered 2020-01-05: 150 mg via INTRAVENOUS
  Filled 2020-01-05: qty 20

## 2020-01-05 MED ORDER — SODIUM CHLORIDE 0.9 % IV SOLN
10.0000 mg | Freq: Once | INTRAVENOUS | Status: AC
  Administered 2020-01-05: 10 mg via INTRAVENOUS
  Filled 2020-01-05: qty 10

## 2020-01-05 NOTE — Progress Notes (Signed)
Patient reports no pain or concerns today.

## 2020-01-07 ENCOUNTER — Inpatient Hospital Stay: Payer: Medicare Other

## 2020-01-07 ENCOUNTER — Other Ambulatory Visit: Payer: Self-pay

## 2020-01-07 DIAGNOSIS — Z5111 Encounter for antineoplastic chemotherapy: Secondary | ICD-10-CM | POA: Diagnosis not present

## 2020-01-07 DIAGNOSIS — C155 Malignant neoplasm of lower third of esophagus: Secondary | ICD-10-CM | POA: Diagnosis not present

## 2020-01-07 DIAGNOSIS — R531 Weakness: Secondary | ICD-10-CM | POA: Diagnosis not present

## 2020-01-07 DIAGNOSIS — Z5189 Encounter for other specified aftercare: Secondary | ICD-10-CM | POA: Diagnosis not present

## 2020-01-07 DIAGNOSIS — R197 Diarrhea, unspecified: Secondary | ICD-10-CM | POA: Diagnosis not present

## 2020-01-07 DIAGNOSIS — I2510 Atherosclerotic heart disease of native coronary artery without angina pectoris: Secondary | ICD-10-CM | POA: Diagnosis not present

## 2020-01-07 MED ORDER — HEPARIN SOD (PORK) LOCK FLUSH 100 UNIT/ML IV SOLN
500.0000 [IU] | Freq: Once | INTRAVENOUS | Status: AC | PRN
  Administered 2020-01-07: 13:00:00 500 [IU]
  Filled 2020-01-07: qty 5

## 2020-01-07 MED ORDER — PEGFILGRASTIM-CBQV 6 MG/0.6ML ~~LOC~~ SOSY
6.0000 mg | PREFILLED_SYRINGE | Freq: Once | SUBCUTANEOUS | Status: AC
  Administered 2020-01-07: 6 mg via SUBCUTANEOUS
  Filled 2020-01-07: qty 0.6

## 2020-01-07 MED ORDER — HEPARIN SOD (PORK) LOCK FLUSH 100 UNIT/ML IV SOLN
INTRAVENOUS | Status: AC
  Filled 2020-01-07: qty 5

## 2020-01-07 MED ORDER — SODIUM CHLORIDE 0.9% FLUSH
10.0000 mL | INTRAVENOUS | Status: DC | PRN
  Administered 2020-01-07: 10 mL
  Filled 2020-01-07: qty 10

## 2020-01-19 NOTE — Progress Notes (Signed)

## 2020-01-20 ENCOUNTER — Telehealth: Payer: Self-pay

## 2020-01-20 NOTE — Telephone Encounter (Signed)
Nutrition  Patient with recurrent adenocarcinoma of esophagus.  Patient receiving chemotherapy.   RD left voicemail on 11/22/19.  No return phone call.  RD called patient again today for nutrition follow-up.  No answer.  Left message with call back number.   Chart reviewed.   Noted weight stable at 136 lb 6.4 oz on 4/21 stable from 137 lb 9.6 oz on 1/20.    Jacayla Nordell B. Zenia Resides, [place], [place] Registered Dietitian [phone] (pager)

## 2020-01-21 NOTE — Progress Notes (Signed)
[place]  [phone] [phone]) [phone]  ID: Corey Perry OB: [birth year]  MR#: [id number]  ZHG#:[id number]  Patient Care Team: Valerie Roys, DO as PCP - General (Family Medicine) [name], MD as PCP - Family Medicine (Family Medicine) [name], MD as Consulting Physician (Oncology) [name], MD as Referring Physician [name], MD as Consulting Physician (Cardiology)  CHIEF COMPLAINT: Recurrent adenocarcinoma of the lower third of the esophagus.  INTERVAL HISTORY: Patient returns to clinic today for further evaluation and consideration of cycle 9 of FOLFOX.  He continues to have chronic weakness and fatigue. He does not complain of dysphagia or difficulty swallowing.  His appetite has improved his weight has remained stable.  He has no neurologic complaints.  He denies any recent fevers or illnesses.  He denies any chest pain, shortness of breath, cough, or hemoptysis.  He denies any nausea, vomiting, constipation, or diarrhea. He has no melena or hematochezia.  He has no urinary complaints.  Patient offers no further specific complaints today.  REVIEW OF SYSTEMS:   Review of Systems  Constitutional: Positive for malaise/fatigue. Negative for fever and weight loss.  HENT: Negative.   Respiratory: Negative.  Negative for cough and shortness of breath.   Cardiovascular: Negative.  Negative for chest pain and leg swelling.  Gastrointestinal: Negative.  Negative for abdominal pain, blood in stool, constipation, diarrhea, melena, nausea and vomiting.  Genitourinary: Negative.  Negative for frequency and urgency.  Musculoskeletal: Negative.  Negative for back pain.  Skin: Negative.  Negative for rash.  Neurological: Positive for weakness. Negative for tingling, focal weakness and headaches.  Psychiatric/Behavioral: Negative.  The patient is not nervous/anxious and does not have insomnia.     As per HPI. Otherwise, a  complete review of systems is negative.  PAST MEDICAL HISTORY: Past Medical History:  Diagnosis Date  . Abnormal white blood cell (WBC) count    seeing Dr. [name] at [place] on 10/25  . Atrial fibrillation ([place])   . BPH (benign prostatic hypertrophy)   . Bradycardia   . CAD (coronary artery disease)   . Dysrhythmia    bradycardia - sees Dr. Humphrey Rolls  . Esophageal cancer ([place]) 05/2016  . GERD (gastroesophageal reflux disease)   . History of kidney stones   . Hyperlipidemia   . Hypertension   . Hypothyroidism   . Kidney stone   . Myocardial infarction ([place]) 2005  . S/P angioplasty with stent 2005   x 4 vessels  . Sleep apnea    mild-NO CPAP  . Spinal stenosis    lumbar    PAST SURGICAL HISTORY: Past Surgical History:  Procedure Laterality Date  . BACK SURGERY    . CARDIAC CATHETERIZATION Left 03/12/2016   Procedure: Left Heart Cath and Coronary Angiography;  Surgeon: [name], MD;  Location: [facility];  Service: Cardiovascular;  Laterality: Left;  . CARDIAC CATHETERIZATION N/A 03/12/2016   Procedure: Coronary Stent Intervention;  Surgeon: Yolonda Kida, MD;  Location: [facility];  Service: Cardiovascular;  Laterality: N/A;  . COLONOSCOPY WITH PROPOFOL N/A 07/13/2015   Procedure: COLONOSCOPY WITH PROPOFOL;  Surgeon: Lucilla Lame, MD;  Location: [facility];  Service: Endoscopy;  Laterality: N/A;  . CORONARY ANGIOPLASTY  10/05 and 12/05   4 DES placed  . CYSTOSCOPY W/ RETROGRADES Left 06/15/2019   Procedure: CYSTOSCOPY WITH RETROGRADE PYELOGRAM;  Surgeon: Abbie Sons, MD;  Location: [facility];  Service: Urology;  Laterality:  Left;  . CYSTOSCOPY/URETEROSCOPY/HOLMIUM LASER/STENT PLACEMENT Left 06/15/2019   Procedure: CYSTOSCOPY/URETEROSCOPY/HOLMIUM LASER/STENT PLACEMENT;  Surgeon: Abbie Sons, MD;  Location: [facility];  Service: Urology;  Laterality: Left;  . ESOPHAGECTOMY  10/07/2016   @ [facility]  . ESOPHAGOGASTRODUODENOSCOPY (EGD) WITH  PROPOFOL N/A 06/14/2016   Procedure: ESOPHAGOGASTRODUODENOSCOPY (EGD) WITH PROPOFOL;  Surgeon: [name], MD;  Location: [facility];  Service: Endoscopy;  Laterality: N/A;  . HERNIA REPAIR Bilateral    x3  . INGUINAL HERNIA REPAIR Right 08/28/2017   Large PerFix plug, recurrent hernia;  Surgeon: Robert Bellow, MD;  Location: [facility];  Service: General;  Laterality: Right;  recurrent  . KIDNEY STONE SURGERY  07/15/2019  . POLYPECTOMY  07/13/2015   Procedure: POLYPECTOMY;  Surgeon: Lucilla Lame, MD;  Location: [facility];  Service: Endoscopy;;  . PORT-A-CATH REMOVAL N/A 01/16/2017   Procedure: REMOVAL PORT-A-CATH;  Surgeon: Olean Ree, MD;  Location: [facility];  Service: General;  Laterality: N/A;  . PORTACATH PLACEMENT N/A 07/08/2016   Procedure: INSERTION PORT-A-CATH;  Surgeon: Olean Ree, MD;  Location: [facility];  Service: General;  Laterality: N/A;  . PORTACATH PLACEMENT Right 08/30/2019   Procedure: INSERTION PORT-A-CATH;  Surgeon: Olean Ree, MD;  Location: [facility];  Service: General;  Laterality: Right;    FAMILY HISTORY Family History  Problem Relation Age of Onset  . Hypertension Mother   . Dementia Mother   . Heart disease Father   . Hypertension Father   . Kidney disease Father   . Hypertension Brother   . Heart disease Brother   . Diabetes Son   . Hypertension Son   . Hyperlipidemia Son   . Hyperlipidemia Daughter   . Hypertension Daughter        ADVANCED DIRECTIVES:    HEALTH MAINTENANCE: Social History   Tobacco Use  . Smoking status: Former Smoker    Packs/day: 1.00    Years: 40.00    Pack years: 40.00    Types: Cigarettes    Quit date: 07/16/2004    Years since quitting: 15.5  . Smokeless tobacco: Former Systems developer    Types: Chew  Substance Use Topics  . Alcohol use: Yes    Alcohol/week: 1.0 standard drinks    Types: 1 Cans of beer per week  . Drug use: No     Colonoscopy:  PAP:  Bone density:  Lipid panel:  No Known  Allergies  Current Outpatient Medications  Medication Sig Dispense Refill  . acetaminophen (TYLENOL) 500 MG tablet Take 500 mg by mouth every 6 (six) hours as needed for mild pain or moderate pain.    Marland Kitchen aspirin 81 MG tablet Take 81 mg by mouth daily.     Marland Kitchen atorvastatin (LIPITOR) 40 MG tablet Take 1 tablet (40 mg total) by mouth daily. 90 tablet 1  . clopidogrel (PLAVIX) 75 MG tablet Take 1 tablet (75 mg total) by mouth daily. 90 tablet 2  . diphenoxylate-atropine (LOMOTIL) 2.5-0.025 MG tablet Take 2 tablets by mouth 4 (four) times daily as needed for diarrhea or loose stools. 30 tablet 0  . isosorbide mononitrate (IMDUR) 30 MG 24 hr tablet Take 1 tablet (30 mg total) by mouth every evening. 90 tablet 1  . levothyroxine (SYNTHROID) 50 MCG tablet Take 1 tablet (50 mcg total) by mouth daily. 90 tablet 1  . lisinopril (ZESTRIL) 2.5 MG tablet Take 1 tablet (2.5 mg total) by mouth every morning. 90 tablet 1  . loperamide (IMODIUM A-D) 2 MG tablet Take 2  mg by mouth 4 (four) times daily as needed for diarrhea or loose stools.    . metoprolol succinate (TOPROL-XL) 25 MG 24 hr tablet Take 25 mg by mouth daily.    . multivitamin-iron-minerals-folic acid (CENTRUM) chewable tablet Chew 1 tablet by mouth daily.    . nitroGLYCERIN (NITROSTAT) 0.4 MG SL tablet Place 1 tablet under the tongue every 5 (five) minutes as needed for chest pain.   98  . ondansetron (ZOFRAN) 8 MG tablet Take 1 tablet (8 mg total) by mouth 2 (two) times daily as needed for refractory nausea / vomiting. Start on day 3 after chemotherapy. 60 tablet 2  . pantoprazole (PROTONIX) 20 MG tablet Take 1 tablet (20 mg total) by mouth daily. 90 tablet 0  . prochlorperazine (COMPAZINE) 10 MG tablet Take 1 tablet (10 mg total) by mouth every 6 (six) hours as needed (Nausea or vomiting). 60 tablet 2   No current facility-administered medications for this visit.   Facility-Administered Medications Ordered in Other Visits  Medication Dose Route  Frequency Provider Last Rate Last Admin  . dexamethasone (DECADRON) 10 mg in sodium chloride 0.9 % 50 mL IVPB  10 mg Intravenous Once [name], MD 204 mL/hr at 01/26/20 0936 10 mg at 01/26/20 0936  . fluorouracil (ADRUCIL) 4,200 mg in sodium chloride 0.9 % 66 mL chemo infusion  2,400 mg/m2 (Treatment Plan Recorded) Intravenous 1 day or 1 dose [name], MD      . fluorouracil (ADRUCIL) chemo injection 700 mg  400 mg/m2 (Treatment Plan Recorded) Intravenous Once [name], MD      . heparin lock flush 100 unit/mL  500 Units Intracatheter Once PRN [name], MD      . leucovorin 700 mg in dextrose 5 % 250 mL infusion  398 mg/m2 (Treatment Plan Recorded) Intravenous Once [name], MD      . oxaliplatin (ELOXATIN) 150 mg in dextrose 5 % 500 mL chemo infusion  85 mg/m2 (Treatment Plan Recorded) Intravenous Once [name], MD        OBJECTIVE: Vitals:   01/26/20 0852  BP: 122/70  Pulse: (!) 56  Resp: 20  SpO2: 100%     Body mass index is 20.5 kg/m.    ECOG FS:1 - Symptomatic but completely ambulatory  General: Thin, no acute distress. Eyes: Pink conjunctiva, anicteric sclera. HEENT: Normocephalic, moist mucous membranes. Lungs: No audible wheezing or coughing. Heart: Regular rate and rhythm. Abdomen: Soft, nontender, no obvious distention. Musculoskeletal: No edema, cyanosis, or clubbing. Neuro: Alert, answering all questions appropriately. Cranial nerves grossly intact. Skin: No rashes or petechiae noted. Psych: Normal affect.   LAB RESULTS:  Lab Results  Component Value Date   NA 138 01/26/2020   K 4.0 01/26/2020   CL 109 01/26/2020   CO2 24 01/26/2020   GLUCOSE 112 (H) 01/26/2020   BUN 12 01/26/2020   CREATININE 1.16 01/26/2020   CALCIUM 8.5 (L) 01/26/2020   PROT 7.1 01/26/2020   ALBUMIN 3.5 01/26/2020   AST 23 01/26/2020   ALT 15 01/26/2020   ALKPHOS 69 01/26/2020   BILITOT 0.5 01/26/2020   GFRNONAA >60  01/26/2020   GFRAA >60 01/26/2020    Lab Results  Component Value Date   WBC 7.6 01/26/2020   NEUTROABS 5.7 01/26/2020   HGB 9.4 (L) 01/26/2020   HCT 30.3 (L) 01/26/2020   MCV 109.8 (H) 01/26/2020   PLT 180 01/26/2020     STUDIES: No results found.  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  PLAN:    1.  Recurrent adenocarcinoma of the lower third of the esophagus: Patient initially completed 6 cycles of weekly carboplatinum and Taxol on August 21, 2016 and daily XRT completed on August 27, 2016. Patient had his esophagectomy on October 07, 2016.  PET scan results from December 07, 2019 reviewed independently with improvement of patient's overall disease burden, but likely residual malignancy.  Proceed with cycle 9 of FOLFOX today.  Given patient's persistent thrombocytopenia, he can only receive treatment every 3 weeks.  Return to clinic in 2 days for pump removal and then in 3 weeks for further evaluation and consideration of cycle 10.  Will reimage at the conclusion of cycle 12.   2.  Diarrhea: Resolved.  Possibly osmotic in nature.  Diarrhea has improved since patient discontinued high fructose drinks.  Continue Lomotil as needed. 3.  Thrombocytopenia: Resolved.  Chemotherapy every 3 weeks as above. 4.  Renal insufficiency: Resolved.  Creatinine is within normal limits. 5.  Anemia: Hemoglobin has trended down slightly to 9.4, monitor. 6.  Leukocytosis: Resolved.  Patient expressed understanding and was in agreement with this plan. He also understands that He can call clinic at any time with any questions, concerns, or complaints.    [name], MD 01/26/20 9:40 AM

## 2020-01-24 ENCOUNTER — Ambulatory Visit (INDEPENDENT_AMBULATORY_CARE_PROVIDER_SITE_OTHER): Payer: Medicare Other | Admitting: Family Medicine

## 2020-01-24 ENCOUNTER — Other Ambulatory Visit: Payer: Self-pay

## 2020-01-24 ENCOUNTER — Encounter: Payer: Self-pay | Admitting: Family Medicine

## 2020-01-24 VITALS — BP 138/80 | HR 60 | Temp 97.6°F | Ht 68.7 in | Wt 138.6 lb

## 2020-01-24 DIAGNOSIS — I129 Hypertensive chronic kidney disease with stage 1 through stage 4 chronic kidney disease, or unspecified chronic kidney disease: Secondary | ICD-10-CM | POA: Diagnosis not present

## 2020-01-24 DIAGNOSIS — I2583 Coronary atherosclerosis due to lipid rich plaque: Secondary | ICD-10-CM | POA: Diagnosis not present

## 2020-01-24 DIAGNOSIS — I2510 Atherosclerotic heart disease of native coronary artery without angina pectoris: Secondary | ICD-10-CM

## 2020-01-24 DIAGNOSIS — N183 Chronic kidney disease, stage 3 unspecified: Secondary | ICD-10-CM | POA: Diagnosis not present

## 2020-01-24 NOTE — Progress Notes (Signed)
BP 138/80 (BP Location: Left Arm, Patient Position: Sitting, Cuff Size: Normal)   Pulse 60   Temp 97.6 F (36.4 C) (Oral)   Ht 5' 8.7" (1.745 m)   Wt 138 lb 9.6 oz (62.9 kg)   SpO2 100%   BMI 20.65 kg/m    Subjective:    Patient ID: Corey Perry, male    DOB: February 18, 1946, 74 y.o.   MRN: [id number]  HPI: Corey Perry is a 74 y.o. male  Chief Complaint  Patient presents with  . Hypertension   HYPERTENSION Hypertension status: better  Satisfied with current treatment? yes Duration of hypertension: chronic BP monitoring frequency:  not checking BP medication side effects:  no Medication compliance: excellent compliance Previous BP meds: lisinopril, metoprolol, imdur Aspirin: no Recurrent headaches: no Visual changes: no Palpitations: no Dyspnea: no Chest pain: no Lower extremity edema: no Dizzy/lightheaded: no  Relevant past medical, surgical, family and social history reviewed and updated as indicated. Interim medical history since our last visit reviewed. Allergies and medications reviewed and updated.  Review of Systems  Constitutional: Negative.   Respiratory: Negative.   Cardiovascular: Negative.   Gastrointestinal: Negative.   Musculoskeletal: Negative.   Psychiatric/Behavioral: Negative.     Per HPI unless specifically indicated above     Objective:    BP 138/80 (BP Location: Left Arm, Patient Position: Sitting, Cuff Size: Normal)   Pulse 60   Temp 97.6 F (36.4 C) (Oral)   Ht 5' 8.7" (1.745 m)   Wt 138 lb 9.6 oz (62.9 kg)   SpO2 100%   BMI 20.65 kg/m   Wt Readings from Last 3 Encounters:  01/24/20 138 lb 9.6 oz (62.9 kg)  01/05/20 136 lb 6.4 oz (61.9 kg)  12/30/19 137 lb (62.1 kg)    Physical Exam Vitals and nursing note reviewed.  Constitutional:      General: He is not in acute distress.    Appearance: Normal appearance. He is not ill-appearing, toxic-appearing or diaphoretic.  HENT:     Head: Normocephalic and atraumatic.       Right Ear: External ear normal.     Left Ear: External ear normal.     Nose: Nose normal.     Mouth/Throat:     Mouth: Mucous membranes are moist.     Pharynx: Oropharynx is clear.  Eyes:     General: No scleral icterus.       Right eye: No discharge.        Left eye: No discharge.     Extraocular Movements: Extraocular movements intact.     Conjunctiva/sclera: Conjunctivae normal.     Pupils: Pupils are equal, round, and reactive to light.  Cardiovascular:     Rate and Rhythm: Normal rate and regular rhythm.     Pulses: Normal pulses.     Heart sounds: Normal heart sounds. No murmur. No friction rub. No gallop.   Pulmonary:     Effort: Pulmonary effort is normal. No respiratory distress.     Breath sounds: Normal breath sounds. No stridor. No wheezing, rhonchi or rales.  Chest:     Chest wall: No tenderness.  Musculoskeletal:        General: Normal range of motion.     Cervical back: Normal range of motion and neck supple.  Skin:    General: Skin is warm and dry.     Capillary Refill: Capillary refill takes less than 2 seconds.     Coloration: Skin is not jaundiced or  pale.     Findings: No bruising, erythema, lesion or rash.  Neurological:     General: No focal deficit present.     Mental Status: He is alert and oriented to person, place, and time. Mental status is at baseline.  Psychiatric:        Mood and Affect: Mood normal.        Behavior: Behavior normal.        Thought Content: Thought content normal.        Judgment: Judgment normal.     Results for orders placed or performed in visit on 01/05/20  Comprehensive metabolic panel  Result Value Ref Range   Sodium 136 135 - 145 mmol/L   Potassium 4.1 3.5 - 5.1 mmol/L   Chloride 104 98 - 111 mmol/L   CO2 24 22 - 32 mmol/L   Glucose, Bld 135 (H) 70 - 99 mg/dL   BUN 15 8 - 23 mg/dL   Creatinine, Ser 1.27 (H) 0.61 - 1.24 mg/dL   Calcium 8.4 (L) 8.9 - 10.3 mg/dL   Total Protein 6.9 6.5 - 8.1 g/dL   Albumin  3.6 3.5 - 5.0 g/dL   AST 29 15 - 41 U/L   ALT 16 0 - 44 U/L   Alkaline Phosphatase 64 38 - 126 U/L   Total Bilirubin 0.6 0.3 - 1.2 mg/dL   GFR calc non Af Amer 56 (L) >60 mL/min   GFR calc Af Amer >60 >60 mL/min   Anion gap 8 5 - 15  CBC with Differential  Result Value Ref Range   WBC 6.8 4.0 - 10.5 K/uL   RBC 2.90 (L) 4.22 - 5.81 MIL/uL   Hemoglobin 9.9 (L) 13.0 - 17.0 g/dL   HCT 31.9 (L) 39.0 - 52.0 %   MCV 110.0 (H) 80.0 - 100.0 fL   MCH 34.1 (H) 26.0 - 34.0 pg   MCHC 31.0 30.0 - 36.0 g/dL   RDW 18.6 (H) 11.5 - 15.5 %   Platelets 154 150 - 400 K/uL   nRBC 0.0 0.0 - 0.2 %   Neutrophils Relative % 75 %   Neutro Abs 5.1 1.7 - 7.7 K/uL   Lymphocytes Relative 10 %   Lymphs Abs 0.7 0.7 - 4.0 K/uL   Monocytes Relative 11 %   Monocytes Absolute 0.8 0.1 - 1.0 K/uL   Eosinophils Relative 2 %   Eosinophils Absolute 0.1 0.0 - 0.5 K/uL   Basophils Relative 1 %   Basophils Absolute 0.0 0.0 - 0.1 K/uL   Immature Granulocytes 1 %   Abs Immature Granulocytes 0.04 0.00 - 0.07 K/uL      Assessment & Plan:   Problem List Items Addressed This Visit      Genitourinary   Hypertensive kidney disease with CKD stage III - Primary    Doing better on the lower dose of medicine. Continue to monitor. Call with any concerns. Continue to monitor.           Follow up plan: Return in about 3 months (around 04/25/2020) for 6 month follow up.

## 2020-01-24 NOTE — Assessment & Plan Note (Signed)
Doing better on the lower dose of medicine. Continue to monitor. Call with any concerns. Continue to monitor.

## 2020-01-26 ENCOUNTER — Inpatient Hospital Stay: Payer: Medicare Other

## 2020-01-26 ENCOUNTER — Telehealth: Payer: Self-pay

## 2020-01-26 ENCOUNTER — Inpatient Hospital Stay: Payer: Medicare Other | Attending: Oncology

## 2020-01-26 ENCOUNTER — Other Ambulatory Visit: Payer: Self-pay

## 2020-01-26 ENCOUNTER — Inpatient Hospital Stay (HOSPITAL_BASED_OUTPATIENT_CLINIC_OR_DEPARTMENT_OTHER): Payer: Medicare Other | Admitting: Oncology

## 2020-01-26 ENCOUNTER — Encounter: Payer: Self-pay | Admitting: Oncology

## 2020-01-26 VITALS — BP 122/70 | HR 56 | Resp 20 | Ht 68.7 in | Wt 137.6 lb

## 2020-01-26 DIAGNOSIS — I2510 Atherosclerotic heart disease of native coronary artery without angina pectoris: Secondary | ICD-10-CM | POA: Insufficient documentation

## 2020-01-26 DIAGNOSIS — N400 Enlarged prostate without lower urinary tract symptoms: Secondary | ICD-10-CM | POA: Insufficient documentation

## 2020-01-26 DIAGNOSIS — Z833 Family history of diabetes mellitus: Secondary | ICD-10-CM | POA: Insufficient documentation

## 2020-01-26 DIAGNOSIS — C155 Malignant neoplasm of lower third of esophagus: Secondary | ICD-10-CM | POA: Diagnosis not present

## 2020-01-26 DIAGNOSIS — E039 Hypothyroidism, unspecified: Secondary | ICD-10-CM | POA: Diagnosis not present

## 2020-01-26 DIAGNOSIS — Z79899 Other long term (current) drug therapy: Secondary | ICD-10-CM | POA: Insufficient documentation

## 2020-01-26 DIAGNOSIS — Z8249 Family history of ischemic heart disease and other diseases of the circulatory system: Secondary | ICD-10-CM | POA: Diagnosis not present

## 2020-01-26 DIAGNOSIS — D649 Anemia, unspecified: Secondary | ICD-10-CM | POA: Diagnosis not present

## 2020-01-26 DIAGNOSIS — E785 Hyperlipidemia, unspecified: Secondary | ICD-10-CM | POA: Diagnosis not present

## 2020-01-26 DIAGNOSIS — Z83438 Family history of other disorder of lipoprotein metabolism and other lipidemia: Secondary | ICD-10-CM | POA: Insufficient documentation

## 2020-01-26 DIAGNOSIS — Z87891 Personal history of nicotine dependence: Secondary | ICD-10-CM | POA: Diagnosis not present

## 2020-01-26 DIAGNOSIS — Z818 Family history of other mental and behavioral disorders: Secondary | ICD-10-CM | POA: Diagnosis not present

## 2020-01-26 DIAGNOSIS — N183 Chronic kidney disease, stage 3 unspecified: Secondary | ICD-10-CM | POA: Diagnosis not present

## 2020-01-26 DIAGNOSIS — Z841 Family history of disorders of kidney and ureter: Secondary | ICD-10-CM | POA: Diagnosis not present

## 2020-01-26 DIAGNOSIS — I129 Hypertensive chronic kidney disease with stage 1 through stage 4 chronic kidney disease, or unspecified chronic kidney disease: Secondary | ICD-10-CM | POA: Diagnosis not present

## 2020-01-26 DIAGNOSIS — R531 Weakness: Secondary | ICD-10-CM | POA: Insufficient documentation

## 2020-01-26 DIAGNOSIS — K219 Gastro-esophageal reflux disease without esophagitis: Secondary | ICD-10-CM | POA: Insufficient documentation

## 2020-01-26 DIAGNOSIS — I4891 Unspecified atrial fibrillation: Secondary | ICD-10-CM | POA: Diagnosis not present

## 2020-01-26 DIAGNOSIS — I252 Old myocardial infarction: Secondary | ICD-10-CM | POA: Insufficient documentation

## 2020-01-26 DIAGNOSIS — Z95828 Presence of other vascular implants and grafts: Secondary | ICD-10-CM

## 2020-01-26 DIAGNOSIS — I2583 Coronary atherosclerosis due to lipid rich plaque: Secondary | ICD-10-CM | POA: Diagnosis not present

## 2020-01-26 LAB — CBC WITH DIFFERENTIAL/PLATELET
Abs Immature Granulocytes: 0.04 10*3/uL (ref 0.00–0.07)
Basophils Absolute: 0.1 10*3/uL (ref 0.0–0.1)
Basophils Relative: 1 %
Eosinophils Absolute: 0.1 10*3/uL (ref 0.0–0.5)
Eosinophils Relative: 2 %
HCT: 30.3 % — ABNORMAL LOW (ref 39.0–52.0)
Hemoglobin: 9.4 g/dL — ABNORMAL LOW (ref 13.0–17.0)
Immature Granulocytes: 1 %
Lymphocytes Relative: 9 %
Lymphs Abs: 0.7 10*3/uL (ref 0.7–4.0)
MCH: 34.1 pg — ABNORMAL HIGH (ref 26.0–34.0)
MCHC: 31 g/dL (ref 30.0–36.0)
MCV: 109.8 fL — ABNORMAL HIGH (ref 80.0–100.0)
Monocytes Absolute: 1 10*3/uL (ref 0.1–1.0)
Monocytes Relative: 13 %
Neutro Abs: 5.7 10*3/uL (ref 1.7–7.7)
Neutrophils Relative %: 74 %
Platelets: 180 10*3/uL (ref 150–400)
RBC: 2.76 MIL/uL — ABNORMAL LOW (ref 4.22–5.81)
RDW: 16.6 % — ABNORMAL HIGH (ref 11.5–15.5)
WBC: 7.6 10*3/uL (ref 4.0–10.5)
nRBC: 0 % (ref 0.0–0.2)

## 2020-01-26 LAB — COMPREHENSIVE METABOLIC PANEL
ALT: 15 U/L (ref 0–44)
AST: 23 U/L (ref 15–41)
Albumin: 3.5 g/dL (ref 3.5–5.0)
Alkaline Phosphatase: 69 U/L (ref 38–126)
Anion gap: 5 (ref 5–15)
BUN: 12 mg/dL (ref 8–23)
CO2: 24 mmol/L (ref 22–32)
Calcium: 8.5 mg/dL — ABNORMAL LOW (ref 8.9–10.3)
Chloride: 109 mmol/L (ref 98–111)
Creatinine, Ser: 1.16 mg/dL (ref 0.61–1.24)
GFR calc Af Amer: >60 mL/min (ref >60)
GFR calc non Af Amer: >60 mL/min (ref >60)
Glucose, Bld: 112 mg/dL — ABNORMAL HIGH (ref 70–99)
Potassium: 4 mmol/L (ref 3.5–5.1)
Sodium: 138 mmol/L (ref 135–145)
Total Bilirubin: 0.5 mg/dL (ref 0.3–1.2)
Total Protein: 7.1 g/dL (ref 6.5–8.1)

## 2020-01-26 MED ORDER — LEUCOVORIN CALCIUM INJECTION 350 MG
398.0000 mg/m2 | Freq: Once | INTRAVENOUS | Status: AC
  Administered 2020-01-26: 10:00:00 700 mg via INTRAVENOUS
  Filled 2020-01-26: qty 25

## 2020-01-26 MED ORDER — SODIUM CHLORIDE 0.9 % IV SOLN
10.0000 mg | Freq: Once | INTRAVENOUS | Status: AC
  Administered 2020-01-26: 10 mg via INTRAVENOUS
  Filled 2020-01-26: qty 10

## 2020-01-26 MED ORDER — FLUOROURACIL CHEMO INJECTION 2.5 GM/50ML
400.0000 mg/m2 | Freq: Once | INTRAVENOUS | Status: AC
  Administered 2020-01-26: 700 mg via INTRAVENOUS
  Filled 2020-01-26: qty 14

## 2020-01-26 MED ORDER — SODIUM CHLORIDE 0.9 % IV SOLN
2400.0000 mg/m2 | INTRAVENOUS | Status: DC
  Administered 2020-01-26: 13:00:00 4200 mg via INTRAVENOUS
  Filled 2020-01-26: qty 84

## 2020-01-26 MED ORDER — SODIUM CHLORIDE 0.9% FLUSH
10.0000 mL | INTRAVENOUS | Status: DC | PRN
  Administered 2020-01-26: 10 mL via INTRAVENOUS
  Filled 2020-01-26: qty 10

## 2020-01-26 MED ORDER — OXALIPLATIN CHEMO INJECTION 100 MG/20ML
85.0000 mg/m2 | Freq: Once | INTRAVENOUS | Status: AC
  Administered 2020-01-26: 150 mg via INTRAVENOUS
  Filled 2020-01-26: qty 20

## 2020-01-26 MED ORDER — PALONOSETRON HCL INJECTION 0.25 MG/5ML
0.2500 mg | Freq: Once | INTRAVENOUS | Status: AC
  Administered 2020-01-26: 10:00:00 0.25 mg via INTRAVENOUS
  Filled 2020-01-26: qty 5

## 2020-01-26 MED ORDER — HEPARIN SOD (PORK) LOCK FLUSH 100 UNIT/ML IV SOLN
500.0000 [IU] | Freq: Once | INTRAVENOUS | Status: DC | PRN
  Filled 2020-01-26: qty 5

## 2020-01-26 MED ORDER — DEXTROSE 5 % IV SOLN
Freq: Once | INTRAVENOUS | Status: AC
  Filled 2020-01-26: qty 250

## 2020-01-26 NOTE — Telephone Encounter (Signed)
Telephone call to patients home, spoke with wife Meryl Crutch.  Wife in agreement with RN making home visit 02/03/20 at 1:00.

## 2020-01-28 ENCOUNTER — Inpatient Hospital Stay: Payer: Medicare Other

## 2020-01-28 ENCOUNTER — Other Ambulatory Visit: Payer: Self-pay

## 2020-01-28 VITALS — BP 143/93 | HR 68 | Temp 96.9°F | Resp 18

## 2020-01-28 DIAGNOSIS — I252 Old myocardial infarction: Secondary | ICD-10-CM | POA: Diagnosis not present

## 2020-01-28 DIAGNOSIS — R531 Weakness: Secondary | ICD-10-CM | POA: Diagnosis not present

## 2020-01-28 DIAGNOSIS — C155 Malignant neoplasm of lower third of esophagus: Secondary | ICD-10-CM

## 2020-01-28 DIAGNOSIS — D649 Anemia, unspecified: Secondary | ICD-10-CM | POA: Diagnosis not present

## 2020-01-28 DIAGNOSIS — I2510 Atherosclerotic heart disease of native coronary artery without angina pectoris: Secondary | ICD-10-CM | POA: Diagnosis not present

## 2020-01-28 DIAGNOSIS — I4891 Unspecified atrial fibrillation: Secondary | ICD-10-CM | POA: Diagnosis not present

## 2020-01-28 MED ORDER — PEGFILGRASTIM-CBQV 6 MG/0.6ML ~~LOC~~ SOSY
6.0000 mg | PREFILLED_SYRINGE | Freq: Once | SUBCUTANEOUS | Status: AC
  Administered 2020-01-28: 6 mg via SUBCUTANEOUS
  Filled 2020-01-28: qty 0.6

## 2020-01-28 MED ORDER — SODIUM CHLORIDE 0.9% FLUSH
10.0000 mL | INTRAVENOUS | Status: DC | PRN
  Filled 2020-01-28: qty 10

## 2020-01-28 MED ORDER — HEPARIN SOD (PORK) LOCK FLUSH 100 UNIT/ML IV SOLN
500.0000 [IU] | Freq: Once | INTRAVENOUS | Status: AC | PRN
  Administered 2020-01-28: 500 [IU]
  Filled 2020-01-28: qty 5

## 2020-01-28 MED ORDER — HEPARIN SOD (PORK) LOCK FLUSH 100 UNIT/ML IV SOLN
INTRAVENOUS | Status: AC
  Filled 2020-01-28: qty 5

## 2020-02-03 ENCOUNTER — Other Ambulatory Visit: Payer: Self-pay

## 2020-02-03 ENCOUNTER — Other Ambulatory Visit: Payer: Medicare Other

## 2020-02-03 DIAGNOSIS — Z515 Encounter for palliative care: Secondary | ICD-10-CM

## 2020-02-03 NOTE — Progress Notes (Signed)
PATIENT NAME: Corey Perry DOB: Jun 01, 1946 MRN: [id number]  PRIMARY CARE PROVIDER: Valerie Roys, DO  RESPONSIBLE PARTY:  Acct ID - Guarantor Home Phone Work Phone Relationship Acct Type  [id number] [name],* [phone]  Self P/F     [address], [place], [place] [address]    PLAN OF CARE and INTERVENTIONS:               1.  GOALS OF CARE/ ADVANCE CARE PLANNING:  Remain in home with wife and continue to receive chemo treatment.                2.  PATIENT/CAREGIVER EDUCATION:  Education on s/s of infection, education on fall precautions, reviewed meds, support               3.  DISEASE STATUS:  RN made scheduled palliative care home visit. Nurse met with patient and wife [name]. Patient and wife report they went out earlier to take their dog to be groomed. Patient continues to receive treatments at the [facility]. Patient reports he has 3 more scheduled chemo treatments to receive. Patient denies having any pain at the present time. Patient reports diarrhea has improved since he has decreased his sugar intake.  Patient and wife  planning on going to the beach next week and both are looking forward to getting away.  Patient denies suffering any recent falls.  Patient has  bruises over right arm. Patient reports dryer vent was clogged and he crawled underneath house to unstop dryer vent. Patient reports he bruises easily. Patients vital signs are stable. Patients breath sounds are clear with some slight wheezing throughout right lung. Patient denies having any shortness of breath or cough. Patient is current weight is 136 lbs. Patient reports his appetite is fair. Patient reports he has been sleeping well at night. Patient has no edema in his lower extremities. Patient and wife talkative and very pleasant. Patient reports he feels very weak and tired for 3 to 4 days after chemo treatment. Education to patient wife to be careful at the beach. Patient and wife have not received covid vaccines.  Dr [name] does not want patient to receive covid vaccine due to being immunocompromised. MD to let patient know when he feels it is safe for patient to receive vaccine.  Patient and wife remained in agreement with palliative care services. Patient and wife encouraged to contact palliative care with questions or concerns.    HISTORY OF PRESENT ILLNESS:  Patient is a 74 year old male who resides in home with wife.  Patient is followed by palliative care and is seen monthly and PRN.  CODE STATUS: DNR  ADVANCED DIRECTIVES: Y MOST FORM: N PPS: 60%   PHYSICAL EXAM:   VITALS: Today's Vitals   02/03/20 1327  Weight: 138 lb (62.6 kg)  PainSc: 0-No pain    LUNGS: breath sounds clear with wheezing in right lung. CARDIAC: Cor RRR  EXTREMITIES: none edema SKIN: bruises to right arm    NEURO: positive for weakness       [name], RN

## 2020-02-04 ENCOUNTER — Other Ambulatory Visit: Payer: Medicare Other

## 2020-02-09 NOTE — Progress Notes (Signed)
Pharmacist Chemotherapy Monitoring - Follow Up Assessment    I verify that I have reviewed each item in the below checklist:  . Regimen for the patient is scheduled for the appropriate day and plan matches scheduled date. Marland Kitchen Appropriate non-routine labs are ordered dependent on drug ordered. . If applicable, additional medications reviewed and ordered per protocol based on lifetime cumulative doses and/or treatment regimen.   Plan for follow-up and/or issues identified: No . I-vent associated with next due treatment: No . MD and/or nursing notified: No  Shaylee Stanislawski K 02/09/2020 8:55 AM

## 2020-02-11 NOTE — Progress Notes (Signed)
[place]  [phone] [phone]  ID: [name] OB: 12-17-1945  MR#: [id number]  IDP#:[id number]  Patient Care Team: Valerie Roys, DO as PCP - General (Family Medicine) [name], MD as PCP - Family Medicine (Family Medicine) [name], MD as Consulting Physician (Oncology) [name], MD as Referring Physician [name], MD as Consulting Physician (Cardiology)  CHIEF COMPLAINT: Recurrent adenocarcinoma of the lower third of the esophagus.  INTERVAL HISTORY: Patient returns to clinic today for further evaluation and consideration of cycle 10 of FOLFOX.  He continues to have chronic weakness and fatigue, but otherwise feels well. He does not complain of dysphagia or difficulty swallowing.  His appetite has improved his weight has remained stable. He has no neurologic complaints.  He denies any recent fevers or illnesses.  He denies any chest pain, shortness of breath, cough, or hemoptysis.  He denies any nausea, vomiting, constipation, or diarrhea. He has no melena or hematochezia.  He has no urinary complaints.  Patient offers no further specific complaints today.  REVIEW OF SYSTEMS:   Review of Systems  Constitutional: Positive for malaise/fatigue. Negative for fever and weight loss.  HENT: Negative.   Respiratory: Negative.  Negative for cough and shortness of breath.   Cardiovascular: Negative.  Negative for chest pain and leg swelling.  Gastrointestinal: Negative.  Negative for abdominal pain, blood in stool, constipation, diarrhea, melena, nausea and vomiting.  Genitourinary: Negative.  Negative for frequency and urgency.  Musculoskeletal: Negative.  Negative for back pain.  Skin: Negative.  Negative for rash.  Neurological: Positive for weakness. Negative for tingling, focal weakness and headaches.  Psychiatric/Behavioral: Negative.  The patient is not nervous/anxious and does not have insomnia.      As per HPI. Otherwise, a complete review of systems is negative.  PAST MEDICAL HISTORY: Past Medical History:  Diagnosis Date  . Abnormal white blood cell (WBC) count    seeing Dr. [name] at [place] on 10/25  . Atrial fibrillation ([place])   . BPH (benign prostatic hypertrophy)   . Bradycardia   . CAD (coronary artery disease)   . Dysrhythmia    bradycardia - sees Dr. Humphrey Rolls  . Esophageal cancer ([place]) 05/2016  . GERD (gastroesophageal reflux disease)   . History of kidney stones   . Hyperlipidemia   . Hypertension   . Hypothyroidism   . Kidney stone   . Myocardial infarction ([place]) 2005  . S/P angioplasty with stent 2005   x 4 vessels  . Sleep apnea    mild-NO CPAP  . Spinal stenosis    lumbar    PAST SURGICAL HISTORY: Past Surgical History:  Procedure Laterality Date  . BACK SURGERY    . CARDIAC CATHETERIZATION Left 03/12/2016   Procedure: Left Heart Cath and Coronary Angiography;  Surgeon: [name], MD;  Location: [facility];  Service: Cardiovascular;  Laterality: Left;  . CARDIAC CATHETERIZATION N/A 03/12/2016   Procedure: Coronary Stent Intervention;  Surgeon: Yolonda Kida, MD;  Location: [facility];  Service: Cardiovascular;  Laterality: N/A;  . COLONOSCOPY WITH PROPOFOL N/A 07/13/2015   Procedure: COLONOSCOPY WITH PROPOFOL;  Surgeon: Lucilla Lame, MD;  Location: [facility];  Service: Endoscopy;  Laterality: N/A;  . CORONARY ANGIOPLASTY  10/05 and 12/05   4 DES placed  . CYSTOSCOPY W/ RETROGRADES Left 06/15/2019   Procedure: CYSTOSCOPY WITH RETROGRADE PYELOGRAM;  Surgeon: Abbie Sons, MD;  Location: [facility];  Service:  Urology;  Laterality: Left;  . CYSTOSCOPY/URETEROSCOPY/HOLMIUM LASER/STENT PLACEMENT Left 06/15/2019   Procedure: CYSTOSCOPY/URETEROSCOPY/HOLMIUM LASER/STENT PLACEMENT;  Surgeon: Abbie Sons, MD;  Location: [facility];  Service: Urology;  Laterality: Left;  . ESOPHAGECTOMY  10/07/2016   @ [facility]  .  ESOPHAGOGASTRODUODENOSCOPY (EGD) WITH PROPOFOL N/A 06/14/2016   Procedure: ESOPHAGOGASTRODUODENOSCOPY (EGD) WITH PROPOFOL;  Surgeon: [name], MD;  Location: [facility];  Service: Endoscopy;  Laterality: N/A;  . HERNIA REPAIR Bilateral    x3  . INGUINAL HERNIA REPAIR Right 08/28/2017   Large PerFix plug, recurrent hernia;  Surgeon: Robert Bellow, MD;  Location: [facility];  Service: General;  Laterality: Right;  recurrent  . KIDNEY STONE SURGERY  07/15/2019  . POLYPECTOMY  07/13/2015   Procedure: POLYPECTOMY;  Surgeon: Lucilla Lame, MD;  Location: [facility];  Service: Endoscopy;;  . PORT-A-CATH REMOVAL N/A 01/16/2017   Procedure: REMOVAL PORT-A-CATH;  Surgeon: Olean Ree, MD;  Location: [facility];  Service: General;  Laterality: N/A;  . PORTACATH PLACEMENT N/A 07/08/2016   Procedure: INSERTION PORT-A-CATH;  Surgeon: Olean Ree, MD;  Location: [facility];  Service: General;  Laterality: N/A;  . PORTACATH PLACEMENT Right 08/30/2019   Procedure: INSERTION PORT-A-CATH;  Surgeon: Olean Ree, MD;  Location: [facility];  Service: General;  Laterality: Right;    FAMILY HISTORY Family History  Problem Relation Age of Onset  . Hypertension Mother   . Dementia Mother   . Heart disease Father   . Hypertension Father   . Kidney disease Father   . Hypertension Brother   . Heart disease Brother   . Diabetes Son   . Hypertension Son   . Hyperlipidemia Son   . Hyperlipidemia Daughter   . Hypertension Daughter        ADVANCED DIRECTIVES:    HEALTH MAINTENANCE: Social History   Tobacco Use  . Smoking status: Former Smoker    Packs/day: 1.00    Years: 40.00    Pack years: 40.00    Types: Cigarettes    Quit date: 07/16/2004    Years since quitting: 15.6  . Smokeless tobacco: Former Systems developer    Types: Chew  Substance Use Topics  . Alcohol use: Yes    Alcohol/week: 1.0 standard drinks    Types: 1 Cans of beer per week  . Drug use: No      Colonoscopy:  PAP:  Bone density:  Lipid panel:  No Known Allergies  Current Outpatient Medications  Medication Sig Dispense Refill  . acetaminophen (TYLENOL) 500 MG tablet Take 500 mg by mouth every 6 (six) hours as needed for mild pain or moderate pain.    Marland Kitchen aspirin 81 MG tablet Take 81 mg by mouth daily.     Marland Kitchen atorvastatin (LIPITOR) 40 MG tablet Take 1 tablet (40 mg total) by mouth daily. 90 tablet 1  . clopidogrel (PLAVIX) 75 MG tablet Take 1 tablet (75 mg total) by mouth daily. 90 tablet 2  . diphenoxylate-atropine (LOMOTIL) 2.5-0.025 MG tablet Take 2 tablets by mouth 4 (four) times daily as needed for diarrhea or loose stools. 30 tablet 0  . isosorbide mononitrate (IMDUR) 30 MG 24 hr tablet Take 1 tablet (30 mg total) by mouth every evening. 90 tablet 1  . levothyroxine (SYNTHROID) 50 MCG tablet Take 1 tablet (50 mcg total) by mouth daily. 90 tablet 1  . lisinopril (ZESTRIL) 2.5 MG tablet Take 1 tablet (2.5 mg total) by mouth every morning. 90 tablet 1  . loperamide (IMODIUM A-D) 2 MG  tablet Take 2 mg by mouth 4 (four) times daily as needed for diarrhea or loose stools.    . metoprolol succinate (TOPROL-XL) 25 MG 24 hr tablet Take 25 mg by mouth daily.    . multivitamin-iron-minerals-folic acid (CENTRUM) chewable tablet Chew 1 tablet by mouth daily.    . nitroGLYCERIN (NITROSTAT) 0.4 MG SL tablet Place 1 tablet under the tongue every 5 (five) minutes as needed for chest pain.   98  . ondansetron (ZOFRAN) 8 MG tablet Take 1 tablet (8 mg total) by mouth 2 (two) times daily as needed for refractory nausea / vomiting. Start on day 3 after chemotherapy. 60 tablet 2  . pantoprazole (PROTONIX) 20 MG tablet Take 1 tablet (20 mg total) by mouth daily. 90 tablet 0  . prochlorperazine (COMPAZINE) 10 MG tablet Take 1 tablet (10 mg total) by mouth every 6 (six) hours as needed (Nausea or vomiting). 60 tablet 2   No current facility-administered medications for this visit.     OBJECTIVE: Vitals:   02/16/20 0832  BP: 125/70  Pulse: 64  Resp: 20  Temp: 98.2 F (36.8 C)  SpO2: 100%     Body mass index is 19.84 kg/m.    ECOG FS:1 - Symptomatic but completely ambulatory  General: Thin, no acute distress. Eyes: Pink conjunctiva, anicteric sclera. HEENT: Normocephalic, moist mucous membranes. Lungs: No audible wheezing or coughing. Heart: Regular rate and rhythm. Abdomen: Soft, nontender, no obvious distention. Musculoskeletal: No edema, cyanosis, or clubbing. Neuro: Alert, answering all questions appropriately. Cranial nerves grossly intact. Skin: No rashes or petechiae noted. Psych: Normal affect.   LAB RESULTS:  Lab Results  Component Value Date   NA 140 02/16/2020   K 3.5 02/16/2020   CL 114 (H) 02/16/2020   CO2 20 (L) 02/16/2020   GLUCOSE 71 02/16/2020   BUN 15 02/16/2020   CREATININE 1.31 (H) 02/16/2020   CALCIUM 8.3 (L) 02/16/2020   PROT 7.1 02/16/2020   ALBUMIN 3.3 (L) 02/16/2020   AST 28 02/16/2020   ALT 25 02/16/2020   ALKPHOS 73 02/16/2020   BILITOT 0.6 02/16/2020   GFRNONAA 54 (L) 02/16/2020   GFRAA >60 02/16/2020    Lab Results  Component Value Date   WBC 8.4 02/16/2020   NEUTROABS 6.7 02/16/2020   HGB 9.0 (L) 02/16/2020   HCT 27.7 (L) 02/16/2020   MCV 106.9 (H) 02/16/2020   PLT 185 02/16/2020     STUDIES: No results found.  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  PLAN:    1.  Recurrent adenocarcinoma of the lower third of the esophagus: Patient initially completed 6 cycles of weekly carboplatinum and Taxol on August 21, 2016 and daily XRT completed on August 27, 2016. Patient had his esophagectomy on October 07, 2016.  PET scan results from December 07, 2019 reviewed independently with improvement of patient's overall disease burden, but likely residual malignancy.  Proceed with cycle 10 of FOLFOX today.  Given patient's persistent thrombocytopenia, he can only receive treatment  every 3 weeks.  Return to clinic in 2 days for pump removal and then in 3 weeks for further evaluation and consideration of cycle 11.  Plan to reimage with PET scan to conclusion of cycle 12. 2.  Diarrhea: Resolved.  Possibly osmotic in nature.  Diarrhea has improved since patient discontinued high fructose drinks.  Continue Lomotil as needed. 3.  Thrombocytopenia: Resolved.  Chemotherapy every 3 weeks as above. 4.  Renal insufficiency: Creatinine slightly increased to 1.31  today.  Encourage increased fluid intake.  Monitor. 5.  Anemia: Hemoglobin has trended down slightly to 9.0, monitor. 6.  Leukocytosis: Resolved.  Patient expressed understanding and was in agreement with this plan. He also understands that He can call clinic at any time with any questions, concerns, or complaints.    [name], MD 02/17/20 6:41 AM

## 2020-02-15 ENCOUNTER — Other Ambulatory Visit: Payer: Self-pay | Admitting: Family Medicine

## 2020-02-15 DIAGNOSIS — E785 Hyperlipidemia, unspecified: Secondary | ICD-10-CM

## 2020-02-15 DIAGNOSIS — E039 Hypothyroidism, unspecified: Secondary | ICD-10-CM

## 2020-02-15 MED ORDER — LEVOTHYROXINE SODIUM 50 MCG PO TABS: 50.0000 ug | ORAL_TABLET | Freq: Every day | ORAL | 1 refills | Status: AC

## 2020-02-15 NOTE — Telephone Encounter (Signed)
Medication Refill - Medication:  levothyroxine (SYNTHROID) 50 MCG tablet [[id number]]   metoprolol succinate (TOPROL-XL) 25 MG 24   Has the patient contacted their pharmacy? No. (Agent: If no, request that the patient contact the pharmacy for the refill.) (Agent: If yes, when and what did the pharmacy advise?)  Preferred Pharmacy (with phone number or street name):  [facility] [place] [facility] [address]  [place], [place] [address]  Phone:  [phone] Fax:  [phone]   Agent: Please be advised that RX refills may take up to 3 business days. We ask that you follow-up with your pharmacy.

## 2020-02-15 NOTE — Telephone Encounter (Signed)
Requested medication (s) are due for refill today - unknown  Requested medication (s) are on the active medication list -yes  Future visit scheduled -yes  Last refill: unknown  Notes to clinic: Request for historical medication RF- sent for review of request  Requested Prescriptions  Pending Prescriptions Disp Refills   metoprolol succinate (TOPROL-XL) 25 MG 24 hr tablet 90 tablet 0    Sig: Take 1 tablet (25 mg total) by mouth daily.      Cardiovascular:  Beta Blockers Passed - 02/15/2020  9:48 AM      Passed - Last BP in normal range    BP Readings from Last 1 Encounters:  02/03/20 124/76          Passed - Last Heart Rate in normal range    Pulse Readings from Last 1 Encounters:  02/03/20 65          Passed - Valid encounter within last 6 months    Recent Outpatient Visits           3 weeks ago Hypertensive kidney disease with stage 3 chronic kidney disease, unspecified whether stage 3a or 3b CKD   [facility] [place], Megan P, DO   3 months ago Encounter for annual wellness exam in Medicare patient   [place], Caroleen, DO   6 months ago Essential hypertension   [facility], [place]   11 months ago Coronary artery disease due to lipid rich plaque   [facility] [place], Jeannette How, MD   1 year ago Essential hypertension   [facility] [place], Jeannette How, MD       Future Appointments             In 2 months Johnson, Megan P, DO [place], [facility]             Signed Prescriptions Disp Refills   levothyroxine (SYNTHROID) 50 MCG tablet 90 tablet 1    Sig: Take 1 tablet (50 mcg total) by mouth daily.      Endocrinology:  Hypothyroid Agents Failed - 02/15/2020  9:48 AM      Failed - TSH needs to be rechecked within 3 months after an abnormal result. Refill until TSH is due.      Passed - TSH in normal range and within 360 days    TSH  Date Value Ref Range Status   10/25/2019 4.070 0.450 - 4.500 uIU/mL Final          Passed - Valid encounter within last 12 months    Recent Outpatient Visits           3 weeks ago Hypertensive kidney disease with stage 3 chronic kidney disease, unspecified whether stage 3a or 3b CKD   [facility] Johnson, Megan P, DO   3 months ago Encounter for annual wellness exam in Medicare patient   [place], [place], DO   6 months ago Essential hypertension   [facility], [place]   11 months ago Coronary artery disease due to lipid rich plaque   [facility] [place], Jeannette How, MD   1 year ago Essential hypertension   [place], Jeannette How, MD       Future Appointments             In 2 months Johnson, Barb Merino, DO [facility], [facility]  Requested Prescriptions  Pending Prescriptions Disp Refills   metoprolol succinate (TOPROL-XL) 25 MG 24 hr tablet 90 tablet 0    Sig: Take 1 tablet (25 mg total) by mouth daily.      Cardiovascular:  Beta Blockers Passed - 02/15/2020  9:48 AM      Passed - Last BP in normal range    BP Readings from Last 1 Encounters:  02/03/20 124/76          Passed - Last Heart Rate in normal range    Pulse Readings from Last 1 Encounters:  02/03/20 65          Passed - Valid encounter within last 6 months    Recent Outpatient Visits           3 weeks ago Hypertensive kidney disease with stage 3 chronic kidney disease, unspecified whether stage 3a or 3b CKD   [facility] [place], Megan P, DO   3 months ago Encounter for annual wellness exam in Medicare patient   [place], [place], DO   6 months ago Essential hypertension   [facility], [place]   11 months ago Coronary artery disease due to lipid rich plaque   [facility] [place], Jeannette How, MD   1 year ago Essential  hypertension   [facility] [place], Jeannette How, MD       Future Appointments             In 2 months Johnson, Megan P, DO [place], [facility]             Signed Prescriptions Disp Refills   levothyroxine (SYNTHROID) 50 MCG tablet 90 tablet 1    Sig: Take 1 tablet (50 mcg total) by mouth daily.      Endocrinology:  Hypothyroid Agents Failed - 02/15/2020  9:48 AM      Failed - TSH needs to be rechecked within 3 months after an abnormal result. Refill until TSH is due.      Passed - TSH in normal range and within 360 days    TSH  Date Value Ref Range Status  10/25/2019 4.070 0.450 - 4.500 uIU/mL Final          Passed - Valid encounter within last 12 months    Recent Outpatient Visits           3 weeks ago Hypertensive kidney disease with stage 3 chronic kidney disease, unspecified whether stage 3a or 3b CKD   [facility] Johnson, Megan P, DO   3 months ago Encounter for annual wellness exam in Medicare patient   [place], [place], DO   6 months ago Essential hypertension   [facility], [place]   11 months ago Coronary artery disease due to lipid rich plaque   [facility] [place], Jeannette How, MD   1 year ago Essential hypertension   [place], Jeannette How, MD       Future Appointments             In 2 months Johnson, Barb Merino, DO [facility], [facility]

## 2020-02-15 NOTE — Telephone Encounter (Signed)
Patient last seen 01/24/20 and has appointment 04/25/20

## 2020-02-16 ENCOUNTER — Inpatient Hospital Stay (HOSPITAL_BASED_OUTPATIENT_CLINIC_OR_DEPARTMENT_OTHER): Payer: Medicare Other | Admitting: Oncology

## 2020-02-16 ENCOUNTER — Inpatient Hospital Stay: Payer: Medicare Other

## 2020-02-16 ENCOUNTER — Encounter: Payer: Self-pay | Admitting: Oncology

## 2020-02-16 ENCOUNTER — Other Ambulatory Visit: Payer: Self-pay

## 2020-02-16 ENCOUNTER — Inpatient Hospital Stay: Payer: Medicare Other | Attending: Oncology

## 2020-02-16 VITALS — BP 125/70 | HR 64 | Temp 98.2°F | Resp 20 | Wt 133.2 lb

## 2020-02-16 DIAGNOSIS — Z83438 Family history of other disorder of lipoprotein metabolism and other lipidemia: Secondary | ICD-10-CM | POA: Insufficient documentation

## 2020-02-16 DIAGNOSIS — C155 Malignant neoplasm of lower third of esophagus: Secondary | ICD-10-CM

## 2020-02-16 DIAGNOSIS — Z8249 Family history of ischemic heart disease and other diseases of the circulatory system: Secondary | ICD-10-CM | POA: Insufficient documentation

## 2020-02-16 DIAGNOSIS — R531 Weakness: Secondary | ICD-10-CM | POA: Diagnosis not present

## 2020-02-16 DIAGNOSIS — Z87891 Personal history of nicotine dependence: Secondary | ICD-10-CM | POA: Diagnosis not present

## 2020-02-16 DIAGNOSIS — E039 Hypothyroidism, unspecified: Secondary | ICD-10-CM | POA: Insufficient documentation

## 2020-02-16 DIAGNOSIS — I2510 Atherosclerotic heart disease of native coronary artery without angina pectoris: Secondary | ICD-10-CM | POA: Insufficient documentation

## 2020-02-16 DIAGNOSIS — I4891 Unspecified atrial fibrillation: Secondary | ICD-10-CM | POA: Diagnosis not present

## 2020-02-16 DIAGNOSIS — E785 Hyperlipidemia, unspecified: Secondary | ICD-10-CM | POA: Insufficient documentation

## 2020-02-16 DIAGNOSIS — Z95828 Presence of other vascular implants and grafts: Secondary | ICD-10-CM

## 2020-02-16 DIAGNOSIS — I2583 Coronary atherosclerosis due to lipid rich plaque: Secondary | ICD-10-CM

## 2020-02-16 DIAGNOSIS — Z5111 Encounter for antineoplastic chemotherapy: Secondary | ICD-10-CM | POA: Insufficient documentation

## 2020-02-16 DIAGNOSIS — N400 Enlarged prostate without lower urinary tract symptoms: Secondary | ICD-10-CM | POA: Diagnosis not present

## 2020-02-16 DIAGNOSIS — Z841 Family history of disorders of kidney and ureter: Secondary | ICD-10-CM | POA: Insufficient documentation

## 2020-02-16 DIAGNOSIS — N289 Disorder of kidney and ureter, unspecified: Secondary | ICD-10-CM | POA: Insufficient documentation

## 2020-02-16 DIAGNOSIS — R197 Diarrhea, unspecified: Secondary | ICD-10-CM | POA: Insufficient documentation

## 2020-02-16 DIAGNOSIS — Z818 Family history of other mental and behavioral disorders: Secondary | ICD-10-CM | POA: Insufficient documentation

## 2020-02-16 DIAGNOSIS — Z833 Family history of diabetes mellitus: Secondary | ICD-10-CM | POA: Insufficient documentation

## 2020-02-16 DIAGNOSIS — K219 Gastro-esophageal reflux disease without esophagitis: Secondary | ICD-10-CM | POA: Diagnosis not present

## 2020-02-16 DIAGNOSIS — Z79899 Other long term (current) drug therapy: Secondary | ICD-10-CM | POA: Insufficient documentation

## 2020-02-16 DIAGNOSIS — I252 Old myocardial infarction: Secondary | ICD-10-CM | POA: Insufficient documentation

## 2020-02-16 DIAGNOSIS — D649 Anemia, unspecified: Secondary | ICD-10-CM | POA: Insufficient documentation

## 2020-02-16 LAB — CBC WITH DIFFERENTIAL/PLATELET
Abs Immature Granulocytes: 0.04 10*3/uL (ref 0.00–0.07)
Basophils Absolute: 0 10*3/uL (ref 0.0–0.1)
Basophils Relative: 0 %
Eosinophils Absolute: 0.1 10*3/uL (ref 0.0–0.5)
Eosinophils Relative: 2 %
HCT: 27.7 % — ABNORMAL LOW (ref 39.0–52.0)
Hemoglobin: 9 g/dL — ABNORMAL LOW (ref 13.0–17.0)
Immature Granulocytes: 1 %
Lymphocytes Relative: 8 %
Lymphs Abs: 0.6 10*3/uL — ABNORMAL LOW (ref 0.7–4.0)
MCH: 34.7 pg — ABNORMAL HIGH (ref 26.0–34.0)
MCHC: 32.5 g/dL (ref 30.0–36.0)
MCV: 106.9 fL — ABNORMAL HIGH (ref 80.0–100.0)
Monocytes Absolute: 0.9 10*3/uL (ref 0.1–1.0)
Monocytes Relative: 11 %
Neutro Abs: 6.7 10*3/uL (ref 1.7–7.7)
Neutrophils Relative %: 78 %
Platelets: 185 10*3/uL (ref 150–400)
RBC: 2.59 MIL/uL — ABNORMAL LOW (ref 4.22–5.81)
RDW: 16.2 % — ABNORMAL HIGH (ref 11.5–15.5)
WBC: 8.4 10*3/uL (ref 4.0–10.5)
nRBC: 0 % (ref 0.0–0.2)

## 2020-02-16 LAB — COMPREHENSIVE METABOLIC PANEL
ALT: 25 U/L (ref 0–44)
AST: 28 U/L (ref 15–41)
Albumin: 3.3 g/dL — ABNORMAL LOW (ref 3.5–5.0)
Alkaline Phosphatase: 73 U/L (ref 38–126)
Anion gap: 6 (ref 5–15)
BUN: 15 mg/dL (ref 8–23)
CO2: 20 mmol/L — ABNORMAL LOW (ref 22–32)
Calcium: 8.3 mg/dL — ABNORMAL LOW (ref 8.9–10.3)
Chloride: 114 mmol/L — ABNORMAL HIGH (ref 98–111)
Creatinine, Ser: 1.31 mg/dL — ABNORMAL HIGH (ref 0.61–1.24)
GFR calc Af Amer: >60 mL/min (ref >60)
GFR calc non Af Amer: 54 mL/min — ABNORMAL LOW (ref >60)
Glucose, Bld: 71 mg/dL (ref 70–99)
Potassium: 3.5 mmol/L (ref 3.5–5.1)
Sodium: 140 mmol/L (ref 135–145)
Total Bilirubin: 0.6 mg/dL (ref 0.3–1.2)
Total Protein: 7.1 g/dL (ref 6.5–8.1)

## 2020-02-16 MED ORDER — FLUOROURACIL CHEMO INJECTION 2.5 GM/50ML
400.0000 mg/m2 | Freq: Once | INTRAVENOUS | Status: AC
  Administered 2020-02-16: 700 mg via INTRAVENOUS
  Filled 2020-02-16: qty 14

## 2020-02-16 MED ORDER — LEUCOVORIN CALCIUM INJECTION 350 MG
398.0000 mg/m2 | Freq: Once | INTRAVENOUS | Status: AC
  Administered 2020-02-16: 700 mg via INTRAVENOUS
  Filled 2020-02-16: qty 25

## 2020-02-16 MED ORDER — DEXTROSE 5 % IV SOLN
Freq: Once | INTRAVENOUS | Status: AC
  Filled 2020-02-16: qty 250

## 2020-02-16 MED ORDER — OXALIPLATIN CHEMO INJECTION 100 MG/20ML
85.0000 mg/m2 | Freq: Once | INTRAVENOUS | Status: AC
  Administered 2020-02-16: 150 mg via INTRAVENOUS
  Filled 2020-02-16: qty 20

## 2020-02-16 MED ORDER — SODIUM CHLORIDE 0.9% FLUSH
10.0000 mL | INTRAVENOUS | Status: DC | PRN
  Administered 2020-02-16: 10 mL via INTRAVENOUS
  Filled 2020-02-16: qty 10

## 2020-02-16 MED ORDER — PALONOSETRON HCL INJECTION 0.25 MG/5ML
0.2500 mg | Freq: Once | INTRAVENOUS | Status: AC
  Administered 2020-02-16: 0.25 mg via INTRAVENOUS
  Filled 2020-02-16: qty 5

## 2020-02-16 MED ORDER — SODIUM CHLORIDE 0.9 % IV SOLN
10.0000 mg | Freq: Once | INTRAVENOUS | Status: AC
  Administered 2020-02-16: 10 mg via INTRAVENOUS
  Filled 2020-02-16: qty 10

## 2020-02-16 MED ORDER — SODIUM CHLORIDE 0.9 % IV SOLN
2400.0000 mg/m2 | INTRAVENOUS | Status: DC
  Administered 2020-02-16: 4200 mg via INTRAVENOUS
  Filled 2020-02-16: qty 84

## 2020-02-17 ENCOUNTER — Telehealth: Payer: Self-pay

## 2020-02-17 MED ORDER — METOPROLOL SUCCINATE ER 25 MG PO TB24: 25.0000 mg | ORAL_TABLET | Freq: Every day | ORAL | 0 refills | Status: DC

## 2020-02-17 NOTE — Telephone Encounter (Signed)
Nutrition  Phone call to patient this am for nutrition follow-up as no return call from messages left on 11/22/19 and 01/20/20.  No answer.  Left message with call back information.  RD available as needed.  Miaa Latterell B. Zenia Resides, [place], [place] Registered Dietitian [phone] (pager)

## 2020-02-18 ENCOUNTER — Other Ambulatory Visit: Payer: Self-pay

## 2020-02-18 ENCOUNTER — Inpatient Hospital Stay: Payer: Medicare Other

## 2020-02-18 VITALS — BP 132/74 | HR 54 | Temp 97.5°F | Resp 18

## 2020-02-18 DIAGNOSIS — Z5111 Encounter for antineoplastic chemotherapy: Secondary | ICD-10-CM | POA: Diagnosis not present

## 2020-02-18 DIAGNOSIS — C155 Malignant neoplasm of lower third of esophagus: Secondary | ICD-10-CM

## 2020-02-18 MED ORDER — SODIUM CHLORIDE 0.9% FLUSH
10.0000 mL | INTRAVENOUS | Status: DC | PRN
  Administered 2020-02-18: 10 mL
  Filled 2020-02-18: qty 10

## 2020-02-18 MED ORDER — HEPARIN SOD (PORK) LOCK FLUSH 100 UNIT/ML IV SOLN
INTRAVENOUS | Status: AC
  Filled 2020-02-18: qty 5

## 2020-02-18 MED ORDER — HEPARIN SOD (PORK) LOCK FLUSH 100 UNIT/ML IV SOLN
500.0000 [IU] | Freq: Once | INTRAVENOUS | Status: AC | PRN
  Administered 2020-02-18: 500 [IU]
  Filled 2020-02-18: qty 5

## 2020-02-18 MED ORDER — PEGFILGRASTIM-CBQV 6 MG/0.6ML ~~LOC~~ SOSY
6.0000 mg | PREFILLED_SYRINGE | Freq: Once | SUBCUTANEOUS | Status: AC
  Administered 2020-02-18: 6 mg via SUBCUTANEOUS
  Filled 2020-02-18: qty 0.6

## 2020-02-29 ENCOUNTER — Other Ambulatory Visit: Payer: Self-pay | Admitting: Hospice and Palliative Medicine

## 2020-02-29 ENCOUNTER — Other Ambulatory Visit: Payer: Medicare Other

## 2020-02-29 ENCOUNTER — Inpatient Hospital Stay: Payer: Medicare Other | Admitting: Hospice and Palliative Medicine

## 2020-02-29 ENCOUNTER — Other Ambulatory Visit: Payer: Self-pay

## 2020-02-29 MED ORDER — AZITHROMYCIN 250 MG PO TABS
ORAL_TABLET | ORAL | 0 refills | Status: DC
Start: 2020-02-29 — End: 2020-04-20

## 2020-02-29 MED ORDER — AMOXICILLIN-POT CLAVULANATE 875-125 MG PO TABS
1.0000 | ORAL_TABLET | Freq: Two times a day (BID) | ORAL | 0 refills | Status: DC
Start: 2020-02-29 — End: 2020-04-20

## 2020-02-29 NOTE — Progress Notes (Unsigned)
PATIENT NAME: Corey Perry DOB: [birth year] MRN: [id number]  PRIMARY CARE PROVIDER: Valerie Roys, DO  RESPONSIBLE PARTY:  Acct ID - Guarantor Home Phone Work Phone Relationship Acct Type  [id number] Corey Perry,* [id number]  Self P/F     [address], [place], [place] [address]    PLAN OF CARE and INTERVENTIONS:               1.  GOALS OF CARE/ ADVANCE CARE PLANNING:  Remain at home with wife and continue to receive treatment.               2.  PATIENT/CAREGIVER EDUCATION: Education on fall precautions, education on s/s of infection, reviewed meds, support                 3.  DISEASE STATUS: RN made scheduled palliative care home visit. Nurse met with patient and patients wife Corey Perry. Patient denies having any pain at the present time. Patient temperature 100.7 (T) and patients O2 SATs 88- 89% on room air. Patient has a productive cough of white sputum. Patient and wife were at the beach the first week of June. Wife reports patient started having upper respiratory symptoms on Sunday. Patient reports Dr Corey Perry is out of town until June 23rd. Nurse called palliative care NP Corey Perry and updated Corey on patients status. Corey made recommendation that patient should be seen at clinic and have chest x-ray. Patient does not want to go to cancer center or have X-Ray.  Corey in agreement with calling RX for antibiotic to [name] in [place]. Corey requests nurse inform patient should his symptoms worsen he would need to be seen. Patient in agreement with taking antibiotic and calling cancer center if he does not improve.  Patient reports his appetite is fair however patient cannot eat anything with sweeteners as anything with sweeteners cause him to have loose bowel movements. Patient denies having any nausea or vomiting. Patient denies suffering any recent falls.  Patient reports he has been sleeping good. Patient and wife report they stayed with their daughter in [place] over the weekend.  Nurse reviewed patient's medications with patient. Patient and wife remain in agreement with palliative care services. Patient and wife encouraged to contact palliative care with questions or concerns.     HISTORY OF PRESENT ILLNESS:  Patient is a 74 year old make who resides at home with his wife.  Patient is followed by palliative care and is seen monthly and PRN.     CODE STATUS: DNR  ADVANCED DIRECTIVES: Y MOST FORM: No PPS: 60%   PHYSICAL EXAM:   VITALS: Today's Vitals   02/29/20 1236  BP: (!) 116/58  Pulse: 67  Resp: 18  Temp: (!) 100.7 F (38.2 C)  TempSrc: Temporal  SpO2: (!) 88%  PainSc: 0-No pain    LUNGS: decreased breath sounds CARDIAC: Cor RRR  EXTREMITIES: none edema SKIN: Skin color, texture, turgor normal. No rashes or lesions  NEURO: Alert and oriented x 3       Corey Simmer, RN

## 2020-02-29 NOTE — Progress Notes (Signed)
Patient was scheduled for clinic visit today but canceled.  He was seen by Wray Kearns, RN and home who called me to report the patient has several days of productive cough, shortness of breath, and fever with T-max of 100.7.  I spoke with Virgie and patient together and advised that he should be evaluated in the clinic with consideration of a CXR. Patient refused. Given symptoms and risk of infection on active cancer treatment, will empirically start abx to cover for CAP. I strongly recommended that patient seek care in the clinic or ER in the event of worsening symptoms. He is scheduled to see Dr. [name] on 03/08/20.  Plan: -Augmentin 875-125mg  PO BID x 10 days -Z Pack x 5 days -ER or clinic if symptoms worsening or no improvement

## 2020-03-02 NOTE — Progress Notes (Signed)
[place]  [phone]) [phone] [phone]) [phone]  ID: [name] OB: 1946/06/26  MR#: [id number]  ALP#:[id number]  Patient Care Team: Valerie Roys, DO as PCP - General (Family Medicine) [name], MD as PCP - Family Medicine (Family Medicine) [name], MD as Consulting Physician (Oncology) [name], MD as Referring Physician [name], MD as Consulting Physician (Cardiology)  CHIEF COMPLAINT: Recurrent adenocarcinoma of the lower third of the esophagus.  INTERVAL HISTORY: Patient returns to clinic today for further evaluation and consideration of cycle 11 of FOLFOX.  He continues to have chronic weakness and fatigue.  He continues to have occasional diarrhea, but this is well controlled with Lomotil.  He otherwise feels well. He does not complain of dysphagia or difficulty swallowing.  His appetite has improved his weight has remained stable. He has no neurologic complaints.  He denies any recent fevers or illnesses.  He denies any chest pain, shortness of breath, cough, or hemoptysis.  He denies any nausea, vomiting, or constipation. He has no melena or hematochezia.  He has no urinary complaints.  Patient offers no further specific complaints today.  REVIEW OF SYSTEMS:   Review of Systems  Constitutional: Positive for malaise/fatigue. Negative for fever and weight loss.  HENT: Negative.   Respiratory: Negative.  Negative for cough and shortness of breath.   Cardiovascular: Negative.  Negative for chest pain and leg swelling.  Gastrointestinal: Positive for diarrhea. Negative for abdominal pain, blood in stool, constipation, melena, nausea and vomiting.  Genitourinary: Negative.  Negative for frequency and urgency.  Musculoskeletal: Negative.  Negative for back pain.  Skin: Negative.  Negative for rash.  Neurological: Positive for weakness. Negative for tingling, focal weakness and headaches.  Psychiatric/Behavioral:  Negative.  The patient is not nervous/anxious and does not have insomnia.     As per HPI. Otherwise, a complete review of systems is negative.  PAST MEDICAL HISTORY: Past Medical History:  Diagnosis Date  . Abnormal white blood cell (WBC) count    seeing Dr. [name] at [place] on 10/25  . Atrial fibrillation ([place])   . BPH (benign prostatic hypertrophy)   . Bradycardia   . CAD (coronary artery disease)   . Dysrhythmia    bradycardia - sees Dr. Humphrey Rolls  . Esophageal cancer ([place]) 05/2016  . GERD (gastroesophageal reflux disease)   . History of kidney stones   . Hyperlipidemia   . Hypertension   . Hypothyroidism   . Kidney stone   . Myocardial infarction ([place]) 2005  . S/P angioplasty with stent 2005   x 4 vessels  . Sleep apnea    mild-NO CPAP  . Spinal stenosis    lumbar    PAST SURGICAL HISTORY: Past Surgical History:  Procedure Laterality Date  . BACK SURGERY    . CARDIAC CATHETERIZATION Left 03/12/2016   Procedure: Left Heart Cath and Coronary Angiography;  Surgeon: [name], MD;  Location: [facility];  Service: Cardiovascular;  Laterality: Left;  . CARDIAC CATHETERIZATION N/A 03/12/2016   Procedure: Coronary Stent Intervention;  Surgeon: Yolonda Kida, MD;  Location: [facility];  Service: Cardiovascular;  Laterality: N/A;  . COLONOSCOPY WITH PROPOFOL N/A 07/13/2015   Procedure: COLONOSCOPY WITH PROPOFOL;  Surgeon: Lucilla Lame, MD;  Location: [facility];  Service: Endoscopy;  Laterality: N/A;  . CORONARY ANGIOPLASTY  10/05 and 12/05   4 DES placed  . CYSTOSCOPY W/ RETROGRADES Left 06/15/2019   Procedure: CYSTOSCOPY WITH  RETROGRADE PYELOGRAM;  Surgeon: Abbie Sons, MD;  Location: [facility];  Service: Urology;  Laterality: Left;  . CYSTOSCOPY/URETEROSCOPY/HOLMIUM LASER/STENT PLACEMENT Left 06/15/2019   Procedure: CYSTOSCOPY/URETEROSCOPY/HOLMIUM LASER/STENT PLACEMENT;  Surgeon: Abbie Sons, MD;  Location: [facility];  Service:  Urology;  Laterality: Left;  . ESOPHAGECTOMY  10/07/2016   @ [facility]  . ESOPHAGOGASTRODUODENOSCOPY (EGD) WITH PROPOFOL N/A 06/14/2016   Procedure: ESOPHAGOGASTRODUODENOSCOPY (EGD) WITH PROPOFOL;  Surgeon: [name], MD;  Location: [facility];  Service: Endoscopy;  Laterality: N/A;  . HERNIA REPAIR Bilateral    x3  . INGUINAL HERNIA REPAIR Right 08/28/2017   Large PerFix plug, recurrent hernia;  Surgeon: Robert Bellow, MD;  Location: [facility];  Service: General;  Laterality: Right;  recurrent  . KIDNEY STONE SURGERY  07/15/2019  . POLYPECTOMY  07/13/2015   Procedure: POLYPECTOMY;  Surgeon: Lucilla Lame, MD;  Location: [facility];  Service: Endoscopy;;  . PORT-A-CATH REMOVAL N/A 01/16/2017   Procedure: REMOVAL PORT-A-CATH;  Surgeon: Olean Ree, MD;  Location: [facility];  Service: General;  Laterality: N/A;  . PORTACATH PLACEMENT N/A 07/08/2016   Procedure: INSERTION PORT-A-CATH;  Surgeon: Olean Ree, MD;  Location: [facility];  Service: General;  Laterality: N/A;  . PORTACATH PLACEMENT Right 08/30/2019   Procedure: INSERTION PORT-A-CATH;  Surgeon: Olean Ree, MD;  Location: [facility];  Service: General;  Laterality: Right;    FAMILY HISTORY Family History  Problem Relation Age of Onset  . Hypertension Mother   . Dementia Mother   . Heart disease Father   . Hypertension Father   . Kidney disease Father   . Hypertension Brother   . Heart disease Brother   . Diabetes Son   . Hypertension Son   . Hyperlipidemia Son   . Hyperlipidemia Daughter   . Hypertension Daughter        ADVANCED DIRECTIVES:    HEALTH MAINTENANCE: Social History   Tobacco Use  . Smoking status: Former Smoker    Packs/day: 1.00    Years: 40.00    Pack years: 40.00    Types: Cigarettes    Quit date: 07/16/2004    Years since quitting: 15.6  . Smokeless tobacco: Former Systems developer    Types: Secondary school teacher  . Vaping Use: Never used  Substance Use Topics  . Alcohol use: Yes     Alcohol/week: 1.0 standard drink    Types: 1 Cans of beer per week  . Drug use: No     Colonoscopy:  PAP:  Bone density:  Lipid panel:  No Known Allergies  Current Outpatient Medications  Medication Sig Dispense Refill  . acetaminophen (TYLENOL) 500 MG tablet Take 500 mg by mouth every 6 (six) hours as needed for mild pain or moderate pain.    Marland Kitchen amoxicillin-clavulanate (AUGMENTIN) 875-125 MG tablet Take 1 tablet by mouth 2 (two) times daily. 20 tablet 0  . aspirin 81 MG tablet Take 81 mg by mouth daily.     Marland Kitchen atorvastatin (LIPITOR) 40 MG tablet Take 1 tablet (40 mg total) by mouth daily. 90 tablet 1  . azithromycin (ZITHROMAX Z-PAK) 250 MG tablet Take 2 tablets by mouth on first day and then take 1 tablet by mouth daily until finished 6 each 0  . clopidogrel (PLAVIX) 75 MG tablet Take 1 tablet (75 mg total) by mouth daily. 90 tablet 2  . isosorbide mononitrate (IMDUR) 30 MG 24 hr tablet Take 1 tablet (30 mg total) by mouth every evening. 90 tablet 1  .  levothyroxine (SYNTHROID) 50 MCG tablet Take 1 tablet (50 mcg total) by mouth daily. 90 tablet 1  . lisinopril (ZESTRIL) 2.5 MG tablet Take 1 tablet (2.5 mg total) by mouth every morning. 90 tablet 1  . loperamide (IMODIUM A-D) 2 MG tablet Take 2 mg by mouth 4 (four) times daily as needed for diarrhea or loose stools.    . metoprolol succinate (TOPROL-XL) 25 MG 24 hr tablet Take 1 tablet (25 mg total) by mouth daily. 90 tablet 0  . multivitamin-iron-minerals-folic acid (CENTRUM) chewable tablet Chew 1 tablet by mouth daily.    . nitroGLYCERIN (NITROSTAT) 0.4 MG SL tablet Place 1 tablet under the tongue every 5 (five) minutes as needed for chest pain.   98  . ondansetron (ZOFRAN) 8 MG tablet Take 1 tablet (8 mg total) by mouth 2 (two) times daily as needed for refractory nausea / vomiting. Start on day 3 after chemotherapy. 60 tablet 2  . pantoprazole (PROTONIX) 20 MG tablet Take 1 tablet (20 mg total) by mouth daily. 90 tablet 0  .  prochlorperazine (COMPAZINE) 10 MG tablet Take 1 tablet (10 mg total) by mouth every 6 (six) hours as needed (Nausea or vomiting). 60 tablet 2  . diphenoxylate-atropine (LOMOTIL) 2.5-0.025 MG tablet Take 2 tablets by mouth 4 (four) times daily as needed for diarrhea or loose stools. 30 tablet 0   No current facility-administered medications for this visit.   Facility-Administered Medications Ordered in Other Visits  Medication Dose Route Frequency Provider Last Rate Last Admin  . fluorouracil (ADRUCIL) 4,200 mg in sodium chloride 0.9 % 66 mL chemo infusion  2,400 mg/m2 (Treatment Plan Recorded) Intravenous 1 day or 1 dose [name], MD   4,200 mg at 03/08/20 1250    OBJECTIVE: Vitals:   03/08/20 0840  BP: 111/72  Pulse: 70  Resp: 16  Temp: 97.6 F (36.4 C)  SpO2: 100%     Body mass index is 20.01 kg/m.    ECOG FS:1 - Symptomatic but completely ambulatory  General: Thin, no acute distress. Eyes: Pink conjunctiva, anicteric sclera. HEENT: Normocephalic, moist mucous membranes. Lungs: No audible wheezing or coughing. Heart: Regular rate and rhythm. Abdomen: Soft, nontender, no obvious distention. Musculoskeletal: No edema, cyanosis, or clubbing. Neuro: Alert, answering all questions appropriately. Cranial nerves grossly intact. Skin: No rashes or petechiae noted. Psych: Normal affect.   LAB RESULTS:  Lab Results  Component Value Date   NA 134 (L) 03/08/2020   K 4.1 03/08/2020   CL 104 03/08/2020   CO2 22 03/08/2020   GLUCOSE 113 (H) 03/08/2020   BUN 15 03/08/2020   CREATININE 1.32 (H) 03/08/2020   CALCIUM 8.2 (L) 03/08/2020   PROT 7.4 03/08/2020   ALBUMIN 2.9 (L) 03/08/2020   AST 29 03/08/2020   ALT 22 03/08/2020   ALKPHOS 79 03/08/2020   BILITOT 0.8 03/08/2020   GFRNONAA 53 (L) 03/08/2020   GFRAA >60 03/08/2020    Lab Results  Component Value Date   WBC 10.4 03/08/2020   NEUTROABS 8.4 (H) 03/08/2020   HGB 8.4 (L) 03/08/2020   HCT 26.8 (L)  03/08/2020   MCV 104.3 (H) 03/08/2020   PLT 237 03/08/2020     STUDIES: No results found.  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  PLAN:    1.  Recurrent adenocarcinoma of the lower third of the esophagus: Patient initially completed 6 cycles of weekly carboplatinum and Taxol on August 21, 2016 and daily XRT completed on August 27, 2016. Patient had his esophagectomy on October 07, 2016.  PET scan results from December 07, 2019 reviewed independently with improvement of patient's overall disease burden, but likely residual malignancy.  Proceed with cycle 11 of FOLFOX today.  Given patient's persistent thrombocytopenia, he can only receive treatment every 3 weeks.  Return to clinic in 2 days for pump removal and then in 3 weeks for further evaluation and consideration of cycle 12.  Plan to reimage with PET scan to conclusion of cycle 12. 2.  Diarrhea: Improved since patient discontinued high fructose drinks.  Continue Lomotil as needed. 3.  Thrombocytopenia: Resolved.  Chemotherapy every 3 weeks as above. 4.  Renal insufficiency: Chronic and unchanged.  Patient's creatinine is 1.32 today.  Encouraged increased fluid intake.  Monitor. 5.  Anemia: Hemoglobin is slowly trending down is now 8.4, monitor. 6.  Leukocytosis: Resolved.  Patient expressed understanding and was in agreement with this plan. He also understands that He can call clinic at any time with any questions, concerns, or complaints.    [name], MD 03/08/20 4:51 PM

## 2020-03-08 ENCOUNTER — Inpatient Hospital Stay: Payer: Medicare Other

## 2020-03-08 ENCOUNTER — Other Ambulatory Visit: Payer: Self-pay

## 2020-03-08 ENCOUNTER — Other Ambulatory Visit: Payer: Self-pay | Admitting: Emergency Medicine

## 2020-03-08 ENCOUNTER — Inpatient Hospital Stay (HOSPITAL_BASED_OUTPATIENT_CLINIC_OR_DEPARTMENT_OTHER): Payer: Medicare Other | Admitting: Oncology

## 2020-03-08 VITALS — BP 111/72 | HR 70 | Temp 97.6°F | Resp 16 | Wt 134.3 lb

## 2020-03-08 DIAGNOSIS — I2510 Atherosclerotic heart disease of native coronary artery without angina pectoris: Secondary | ICD-10-CM

## 2020-03-08 DIAGNOSIS — C155 Malignant neoplasm of lower third of esophagus: Secondary | ICD-10-CM

## 2020-03-08 DIAGNOSIS — I2583 Coronary atherosclerosis due to lipid rich plaque: Secondary | ICD-10-CM

## 2020-03-08 DIAGNOSIS — Z95828 Presence of other vascular implants and grafts: Secondary | ICD-10-CM

## 2020-03-08 DIAGNOSIS — Z5111 Encounter for antineoplastic chemotherapy: Secondary | ICD-10-CM | POA: Diagnosis not present

## 2020-03-08 LAB — CBC WITH DIFFERENTIAL/PLATELET
Abs Immature Granulocytes: 0.05 10*3/uL (ref 0.00–0.07)
Basophils Absolute: 0 10*3/uL (ref 0.0–0.1)
Basophils Relative: 0 %
Eosinophils Absolute: 0.1 10*3/uL (ref 0.0–0.5)
Eosinophils Relative: 1 %
HCT: 26.8 % — ABNORMAL LOW (ref 39.0–52.0)
Hemoglobin: 8.4 g/dL — ABNORMAL LOW (ref 13.0–17.0)
Immature Granulocytes: 1 %
Lymphocytes Relative: 5 %
Lymphs Abs: 0.5 10*3/uL — ABNORMAL LOW (ref 0.7–4.0)
MCH: 32.7 pg (ref 26.0–34.0)
MCHC: 31.3 g/dL (ref 30.0–36.0)
MCV: 104.3 fL — ABNORMAL HIGH (ref 80.0–100.0)
Monocytes Absolute: 1.3 10*3/uL — ABNORMAL HIGH (ref 0.1–1.0)
Monocytes Relative: 13 %
Neutro Abs: 8.4 10*3/uL — ABNORMAL HIGH (ref 1.7–7.7)
Neutrophils Relative %: 80 %
Platelets: 237 10*3/uL (ref 150–400)
RBC: 2.57 MIL/uL — ABNORMAL LOW (ref 4.22–5.81)
RDW: 16.7 % — ABNORMAL HIGH (ref 11.5–15.5)
WBC: 10.4 10*3/uL (ref 4.0–10.5)
nRBC: 0 % (ref 0.0–0.2)

## 2020-03-08 LAB — COMPREHENSIVE METABOLIC PANEL
ALT: 22 U/L (ref 0–44)
AST: 29 U/L (ref 15–41)
Albumin: 2.9 g/dL — ABNORMAL LOW (ref 3.5–5.0)
Alkaline Phosphatase: 79 U/L (ref 38–126)
Anion gap: 8 (ref 5–15)
BUN: 15 mg/dL (ref 8–23)
CO2: 22 mmol/L (ref 22–32)
Calcium: 8.2 mg/dL — ABNORMAL LOW (ref 8.9–10.3)
Chloride: 104 mmol/L (ref 98–111)
Creatinine, Ser: 1.32 mg/dL — ABNORMAL HIGH (ref 0.61–1.24)
GFR calc Af Amer: >60 mL/min (ref >60)
GFR calc non Af Amer: 53 mL/min — ABNORMAL LOW (ref >60)
Glucose, Bld: 113 mg/dL — ABNORMAL HIGH (ref 70–99)
Potassium: 4.1 mmol/L (ref 3.5–5.1)
Sodium: 134 mmol/L — ABNORMAL LOW (ref 135–145)
Total Bilirubin: 0.8 mg/dL (ref 0.3–1.2)
Total Protein: 7.4 g/dL (ref 6.5–8.1)

## 2020-03-08 MED ORDER — SODIUM CHLORIDE 0.9 % IV SOLN
10.0000 mg | Freq: Once | INTRAVENOUS | Status: AC
  Administered 2020-03-08: 10 mg via INTRAVENOUS
  Filled 2020-03-08: qty 10

## 2020-03-08 MED ORDER — DEXTROSE 5 % IV SOLN
Freq: Once | INTRAVENOUS | Status: AC
  Filled 2020-03-08: qty 250

## 2020-03-08 MED ORDER — SODIUM CHLORIDE 0.9 % IV SOLN
2400.0000 mg/m2 | INTRAVENOUS | Status: DC
  Administered 2020-03-08: 4200 mg via INTRAVENOUS
  Filled 2020-03-08: qty 84

## 2020-03-08 MED ORDER — SODIUM CHLORIDE 0.9% FLUSH
10.0000 mL | INTRAVENOUS | Status: DC | PRN
  Administered 2020-03-08: 10 mL via INTRAVENOUS
  Filled 2020-03-08: qty 10

## 2020-03-08 MED ORDER — LEUCOVORIN CALCIUM INJECTION 350 MG
398.0000 mg/m2 | Freq: Once | INTRAVENOUS | Status: AC
  Administered 2020-03-08: 700 mg via INTRAVENOUS
  Filled 2020-03-08: qty 10

## 2020-03-08 MED ORDER — PALONOSETRON HCL INJECTION 0.25 MG/5ML
0.2500 mg | Freq: Once | INTRAVENOUS | Status: AC
  Administered 2020-03-08: 0.25 mg via INTRAVENOUS
  Filled 2020-03-08: qty 5

## 2020-03-08 MED ORDER — FLUOROURACIL CHEMO INJECTION 2.5 GM/50ML
400.0000 mg/m2 | Freq: Once | INTRAVENOUS | Status: AC
  Administered 2020-03-08: 700 mg via INTRAVENOUS
  Filled 2020-03-08: qty 14

## 2020-03-08 MED ORDER — OXALIPLATIN CHEMO INJECTION 100 MG/20ML
85.0000 mg/m2 | Freq: Once | INTRAVENOUS | Status: AC
  Administered 2020-03-08: 150 mg via INTRAVENOUS
  Filled 2020-03-08: qty 20

## 2020-03-08 MED ORDER — DIPHENOXYLATE-ATROPINE 2.5-0.025 MG PO TABS: 2.0000 | ORAL_TABLET | Freq: Four times a day (QID) | ORAL | 0 refills | Status: AC | PRN

## 2020-03-08 NOTE — Progress Notes (Signed)
Patient states he is still having diarrhea and takes meds to help. He states he is about to run out of lomotil and will need a refill.

## 2020-03-10 ENCOUNTER — Inpatient Hospital Stay: Payer: Medicare Other

## 2020-03-10 ENCOUNTER — Other Ambulatory Visit: Payer: Self-pay

## 2020-03-10 VITALS — BP 101/61 | HR 71 | Temp 97.2°F | Resp 16

## 2020-03-10 DIAGNOSIS — C155 Malignant neoplasm of lower third of esophagus: Secondary | ICD-10-CM

## 2020-03-10 DIAGNOSIS — Z5111 Encounter for antineoplastic chemotherapy: Secondary | ICD-10-CM | POA: Diagnosis not present

## 2020-03-10 MED ORDER — HEPARIN SOD (PORK) LOCK FLUSH 100 UNIT/ML IV SOLN
INTRAVENOUS | Status: AC
  Filled 2020-03-10: qty 5

## 2020-03-10 MED ORDER — HEPARIN SOD (PORK) LOCK FLUSH 100 UNIT/ML IV SOLN
500.0000 [IU] | Freq: Once | INTRAVENOUS | Status: AC | PRN
  Administered 2020-03-10: 500 [IU]
  Filled 2020-03-10: qty 5

## 2020-03-10 MED ORDER — PEGFILGRASTIM-CBQV 6 MG/0.6ML ~~LOC~~ SOSY
6.0000 mg | PREFILLED_SYRINGE | Freq: Once | SUBCUTANEOUS | Status: AC
  Administered 2020-03-10: 6 mg via SUBCUTANEOUS
  Filled 2020-03-10: qty 0.6

## 2020-03-10 MED ORDER — SODIUM CHLORIDE 0.9% FLUSH
10.0000 mL | INTRAVENOUS | Status: DC | PRN
  Administered 2020-03-10: 10 mL
  Filled 2020-03-10: qty 10

## 2020-03-11 ENCOUNTER — Other Ambulatory Visit: Payer: Self-pay | Admitting: Family Medicine

## 2020-03-11 DIAGNOSIS — K219 Gastro-esophageal reflux disease without esophagitis: Secondary | ICD-10-CM

## 2020-03-11 NOTE — Telephone Encounter (Signed)
Requested Prescriptions  Pending Prescriptions Disp Refills  . pantoprazole (PROTONIX) 20 MG tablet [Pharmacy Med Name: PANTOPRAZOLE SODIUM 20 MG DR TAB] 90 tablet 3    Sig: TAKE ONE TABLET BY MOUTH ONCE DAILY     Gastroenterology: Proton Pump Inhibitors Passed - 03/11/2020  8:45 AM      Passed - Valid encounter within last 12 months    Recent Outpatient Visits          1 month ago Hypertensive kidney disease with stage 3 chronic kidney disease, unspecified whether stage 3a or 3b CKD   [facility] Johnson, Megan P, DO   4 months ago Encounter for annual wellness exam in Medicare patient   [place], [place], DO   7 months ago Essential hypertension   [place], [place], [place]   1 year ago Coronary artery disease due to lipid rich plaque   [facility] [place], Jeannette How, MD   1 year ago Essential hypertension   [place], Jeannette How, MD      Future Appointments            In 1 month Johnson, Barb Merino, DO [employer]

## 2020-03-23 IMAGING — CT NM PET TUM IMG RESTAG (PS) SKULL BASE T - THIGH
1 of 11 series · 1 of 25 positions shown · non-contrast
Comparison: Thursday June, 2016 and imaging from Wednesday April, 2019

CLINICAL DATA: Subsequent treatment strategy for esophageal cancer
recurrence. He was initially treated in 5340 with esophagectomy
following neoadjuvant chemo radiotherapy. Most recent imaging was
suggestive of recurrence, confirmed with biopsy. History of
chemotherapy reportedly on cycle 6 of chemotherapy.

EXAM:
NUCLEAR MEDICINE PET SKULL BASE TO THIGH
TECHNIQUE: 7.75 mCi F-18 FDG was injected intravenously. Full-ring PET imaging
was performed from the skull base to thigh after the radiotracer. CT
data was obtained and used for attenuation correction and anatomic
localization.
Fasting blood glucose: 97 mg/dl

[Series 3: ct wb 5.0 b30f · axial · 5.0mm · 0.98mm/px · 1 of 287 slices shown]
[im 287/287  brain]
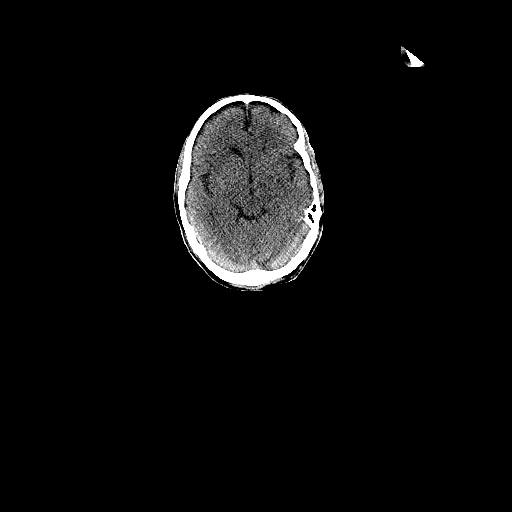

[1 of 25 positions shown; findings below may reference images not displayed]

FINDINGS: Mediastinal blood pool activity: SUV max

Liver activity: SUV max NA

NECK: No hypermetabolic lymph nodes in the neck.

Incidental CT findings: Calcified atherosclerosis of the carotid
arteries bilaterally worse on the left.

CHEST: Nodules in the right upper lobe showing increased FDG uptake
in the dependent portion of the right upper lobe (image 86, series
3) discrete areas of nodularity 1 measuring 10 mm the other
measuring 9 mm. (SUVmax = 1.9) areas of tree-in-bud opacity seen in
the superior segment of the right lower lobe and also in the upper
lobe inferior to the larger areas of nodularity. A third area of
nodularity is demonstrated measuring 10 mm (image 91, series 3)
(SUVmax = 2.3)

Grouped nodules are also present in the anterior right middle lobe
(image 116, series 3) also displaying low level uptake similar to or
less than blood pool. Mild increased FDG uptake noted along site of
Port-A-Cath insertion.

Scattered ground-glass opacities in the inferior left chest and
areas of nodularity which are ill-defined but display more increased
FDG uptake than the right-sided findings (SUVmax = 3.1) geographic
area in the left lower lobe corresponding to this area measures
approximately 5 x 3.2 cm (image 129, series 3) no signs of pleural
effusion. Airways are patent.

Incidental CT findings: Right Port-A-Cath terminates in the proximal
superior vena cava. Calcified coronary artery disease.
Low-attenuation cardiac blood pool. No pericardial effusion. Limited
assessment of cardiac structures due to lack of contrast.

Post esophagectomy and gastric pull-through with extensive debris in
the intrathoracic stomach.

ABDOMEN/PELVIS: Distal stomach is contiguous with abnormal soft
tissue in the upper abdomen, better displayed on imaging from Wednesday April, 2019 (SUVmax = 3.3. Soft tissue is inseparable from the pancreas
and obscures the celiac and SMA origins on this noncontrast
evaluation. Scattered areas of more pronounced FDG uptake are noted,
potentially related to bowel or gastric activity based on the
pattern. Bandlike area across the liver margin also likely related
to adjacent colonic activity.

) .

Incidental CT findings:

No focal hepatic lesions seen on CT images. Pancreas is inseparable
from soft tissue in the upper abdomen. Soft tissue is
morphologically quite similar to the previous exam measuring
approximately 2 cm greatest thickness just below the left
diaphragmatic crus as compared to 2.5 cm in greatest thickness on
the previous study (image 151, series 3)

Intra-aortocaval nodal tissue also similar to previous imaging
without uptake beyond blood pool

Spleen is normal size.

Adrenal glands are normal.

Nonobstructing nephrolithiasis on the left with interpolar calculus
measuring 8 mm and a lower pole calculus measuring 6 mm. The renal
pelvic calculus that was seen previously is no longer visualized. No
sign of hydronephrosis.

No evidence of bowel obstruction though there are stool filled loops
of bowel in the lower pelvis and small volume free fluid in the
pelvis. Free fluid was present previously. The long segment colonic
thickening likely reflects diverticular changes.

Abdominal aortic aneurysm at 3 by 3.1 cm similar to the previous
study

SKELETON: No focal hypermetabolic activity to suggest skeletal
metastasis.

Incidental CT findings: Spinal degenerative change. No acute or
destructive bone process. Findings of avascular necrosis of the
bilateral femoral heads.
IMPRESSION: 1. Confluent soft tissue the level of the celiac axis surrounding
the aorta, abutting the distal stomach and inseparable from pancreas
on today's study showing mild increased FDG uptake in the area of
the patient's known esophageal cancer recurrence.
2. Above findings may be slightly smaller than on the previous exam
and show minimal uptake which may reflect post treatment changes
though are seen on today's study without a baseline PET in terms
recurrence prior to therapy.
3. Nodular changes in the right upper lobe and superior segment
right lower lobe as well as more diffuse changes in the left lower
lobe suspicious for pneumonitis, perhaps related to infection or
aspiration. Minimal FDG uptake within the discrete nodular foci and
more profound uptake in the left lower lobe. Correlate with any
respiratory symptoms or recent infection, suggest short interval
follow-up CT, within 6-12 weeks, for further assessment.
4. Small free fluid in the pelvis and some thickened bowel loops,
correlate with any clinical evidence of enteritis. Findings are
similar to the prior study.

## 2020-03-29 ENCOUNTER — Telehealth: Payer: Self-pay

## 2020-03-29 ENCOUNTER — Encounter: Payer: Self-pay | Admitting: Nurse Practitioner

## 2020-03-29 ENCOUNTER — Inpatient Hospital Stay (HOSPITAL_BASED_OUTPATIENT_CLINIC_OR_DEPARTMENT_OTHER): Payer: Medicare Other | Admitting: Nurse Practitioner

## 2020-03-29 ENCOUNTER — Inpatient Hospital Stay: Payer: Medicare Other

## 2020-03-29 ENCOUNTER — Inpatient Hospital Stay: Payer: Medicare Other | Attending: Oncology

## 2020-03-29 ENCOUNTER — Other Ambulatory Visit: Payer: Self-pay

## 2020-03-29 VITALS — BP 116/83 | HR 72 | Temp 97.5°F | Resp 20 | Wt 130.5 lb

## 2020-03-29 DIAGNOSIS — I129 Hypertensive chronic kidney disease with stage 1 through stage 4 chronic kidney disease, or unspecified chronic kidney disease: Secondary | ICD-10-CM | POA: Insufficient documentation

## 2020-03-29 DIAGNOSIS — N400 Enlarged prostate without lower urinary tract symptoms: Secondary | ICD-10-CM | POA: Insufficient documentation

## 2020-03-29 DIAGNOSIS — D6481 Anemia due to antineoplastic chemotherapy: Secondary | ICD-10-CM | POA: Diagnosis not present

## 2020-03-29 DIAGNOSIS — I2510 Atherosclerotic heart disease of native coronary artery without angina pectoris: Secondary | ICD-10-CM | POA: Insufficient documentation

## 2020-03-29 DIAGNOSIS — K219 Gastro-esophageal reflux disease without esophagitis: Secondary | ICD-10-CM | POA: Diagnosis not present

## 2020-03-29 DIAGNOSIS — I4891 Unspecified atrial fibrillation: Secondary | ICD-10-CM | POA: Insufficient documentation

## 2020-03-29 DIAGNOSIS — E039 Hypothyroidism, unspecified: Secondary | ICD-10-CM | POA: Diagnosis not present

## 2020-03-29 DIAGNOSIS — R531 Weakness: Secondary | ICD-10-CM | POA: Insufficient documentation

## 2020-03-29 DIAGNOSIS — Z5111 Encounter for antineoplastic chemotherapy: Secondary | ICD-10-CM | POA: Diagnosis not present

## 2020-03-29 DIAGNOSIS — C155 Malignant neoplasm of lower third of esophagus: Secondary | ICD-10-CM | POA: Diagnosis not present

## 2020-03-29 DIAGNOSIS — Z833 Family history of diabetes mellitus: Secondary | ICD-10-CM | POA: Diagnosis not present

## 2020-03-29 DIAGNOSIS — Z87891 Personal history of nicotine dependence: Secondary | ICD-10-CM | POA: Diagnosis not present

## 2020-03-29 DIAGNOSIS — E785 Hyperlipidemia, unspecified: Secondary | ICD-10-CM | POA: Diagnosis not present

## 2020-03-29 DIAGNOSIS — I252 Old myocardial infarction: Secondary | ICD-10-CM | POA: Insufficient documentation

## 2020-03-29 DIAGNOSIS — Z818 Family history of other mental and behavioral disorders: Secondary | ICD-10-CM | POA: Insufficient documentation

## 2020-03-29 DIAGNOSIS — G629 Polyneuropathy, unspecified: Secondary | ICD-10-CM | POA: Diagnosis not present

## 2020-03-29 DIAGNOSIS — Z79899 Other long term (current) drug therapy: Secondary | ICD-10-CM | POA: Diagnosis not present

## 2020-03-29 DIAGNOSIS — Z841 Family history of disorders of kidney and ureter: Secondary | ICD-10-CM | POA: Diagnosis not present

## 2020-03-29 DIAGNOSIS — D701 Agranulocytosis secondary to cancer chemotherapy: Secondary | ICD-10-CM

## 2020-03-29 DIAGNOSIS — D6959 Other secondary thrombocytopenia: Secondary | ICD-10-CM | POA: Diagnosis not present

## 2020-03-29 DIAGNOSIS — Z8249 Family history of ischemic heart disease and other diseases of the circulatory system: Secondary | ICD-10-CM | POA: Insufficient documentation

## 2020-03-29 DIAGNOSIS — T451X5A Adverse effect of antineoplastic and immunosuppressive drugs, initial encounter: Secondary | ICD-10-CM | POA: Insufficient documentation

## 2020-03-29 DIAGNOSIS — N183 Chronic kidney disease, stage 3 unspecified: Secondary | ICD-10-CM | POA: Diagnosis not present

## 2020-03-29 DIAGNOSIS — Z5189 Encounter for other specified aftercare: Secondary | ICD-10-CM | POA: Diagnosis not present

## 2020-03-29 LAB — CBC WITH DIFFERENTIAL/PLATELET
Abs Immature Granulocytes: 0.08 10*3/uL — ABNORMAL HIGH (ref 0.00–0.07)
Basophils Absolute: 0.1 10*3/uL (ref 0.0–0.1)
Basophils Relative: 1 %
Eosinophils Absolute: 0.1 10*3/uL (ref 0.0–0.5)
Eosinophils Relative: 1 %
HCT: 27.4 % — ABNORMAL LOW (ref 39.0–52.0)
Hemoglobin: 8.3 g/dL — ABNORMAL LOW (ref 13.0–17.0)
Immature Granulocytes: 1 %
Lymphocytes Relative: 7 %
Lymphs Abs: 0.7 10*3/uL (ref 0.7–4.0)
MCH: 31.9 pg (ref 26.0–34.0)
MCHC: 30.3 g/dL (ref 30.0–36.0)
MCV: 105.4 fL — ABNORMAL HIGH (ref 80.0–100.0)
Monocytes Absolute: 0.9 10*3/uL (ref 0.1–1.0)
Monocytes Relative: 9 %
Neutro Abs: 8.7 10*3/uL — ABNORMAL HIGH (ref 1.7–7.7)
Neutrophils Relative %: 81 %
Platelets: 224 10*3/uL (ref 150–400)
RBC: 2.6 MIL/uL — ABNORMAL LOW (ref 4.22–5.81)
RDW: 19.9 % — ABNORMAL HIGH (ref 11.5–15.5)
WBC: 10.5 10*3/uL (ref 4.0–10.5)
nRBC: 0 % (ref 0.0–0.2)

## 2020-03-29 LAB — COMPREHENSIVE METABOLIC PANEL
ALT: 21 U/L (ref 0–44)
AST: 27 U/L (ref 15–41)
Albumin: 2.8 g/dL — ABNORMAL LOW (ref 3.5–5.0)
Alkaline Phosphatase: 83 U/L (ref 38–126)
Anion gap: 8 (ref 5–15)
BUN: 14 mg/dL (ref 8–23)
CO2: 24 mmol/L (ref 22–32)
Calcium: 8.3 mg/dL — ABNORMAL LOW (ref 8.9–10.3)
Chloride: 107 mmol/L (ref 98–111)
Creatinine, Ser: 1.21 mg/dL (ref 0.61–1.24)
GFR calc Af Amer: >60 mL/min (ref >60)
GFR calc non Af Amer: 59 mL/min — ABNORMAL LOW (ref >60)
Glucose, Bld: 89 mg/dL (ref 70–99)
Potassium: 4.6 mmol/L (ref 3.5–5.1)
Sodium: 139 mmol/L (ref 135–145)
Total Bilirubin: 0.4 mg/dL (ref 0.3–1.2)
Total Protein: 7.2 g/dL (ref 6.5–8.1)

## 2020-03-29 MED ORDER — PALONOSETRON HCL INJECTION 0.25 MG/5ML
0.2500 mg | Freq: Once | INTRAVENOUS | Status: AC
  Administered 2020-03-29: 0.25 mg via INTRAVENOUS
  Filled 2020-03-29: qty 5

## 2020-03-29 MED ORDER — SODIUM CHLORIDE 0.9 % IV SOLN
2400.0000 mg/m2 | INTRAVENOUS | Status: DC
  Administered 2020-03-29: 4200 mg via INTRAVENOUS
  Filled 2020-03-29: qty 84

## 2020-03-29 MED ORDER — SODIUM CHLORIDE 0.9 % IV SOLN
10.0000 mg | Freq: Once | INTRAVENOUS | Status: AC
  Administered 2020-03-29: 10 mg via INTRAVENOUS
  Filled 2020-03-29: qty 1

## 2020-03-29 MED ORDER — SODIUM CHLORIDE 0.9% FLUSH
10.0000 mL | Freq: Once | INTRAVENOUS | Status: AC
  Administered 2020-03-29: 10 mL via INTRAVENOUS
  Filled 2020-03-29: qty 10

## 2020-03-29 MED ORDER — FLUOROURACIL CHEMO INJECTION 2.5 GM/50ML
400.0000 mg/m2 | Freq: Once | INTRAVENOUS | Status: AC
  Administered 2020-03-29: 700 mg via INTRAVENOUS
  Filled 2020-03-29: qty 14

## 2020-03-29 MED ORDER — OXALIPLATIN CHEMO INJECTION 100 MG/20ML
85.0000 mg/m2 | Freq: Once | INTRAVENOUS | Status: AC
  Administered 2020-03-29: 150 mg via INTRAVENOUS
  Filled 2020-03-29: qty 30

## 2020-03-29 MED ORDER — DEXTROSE 5 % IV SOLN
Freq: Once | INTRAVENOUS | Status: AC
  Filled 2020-03-29: qty 250

## 2020-03-29 MED ORDER — LEUCOVORIN CALCIUM INJECTION 350 MG
398.0000 mg/m2 | Freq: Once | INTRAVENOUS | Status: AC
  Administered 2020-03-29: 700 mg via INTRAVENOUS
  Filled 2020-03-29: qty 35

## 2020-03-29 NOTE — Progress Notes (Signed)
Pt here for follow up. Reports that in the last 10 days has developed tingling in the tips of his fingers, worse with cold, and numbness on the bottoms of his feet.

## 2020-03-29 NOTE — Telephone Encounter (Signed)
Telephone call to schedule palliative care visit.  RN spoke with patients wife.  Patient and wife in agreement with home visit 04/13/20 at 10:00 AM.

## 2020-03-29 NOTE — Progress Notes (Addendum)
[place]  [phone] [phone]  Virtual Visit Progress Note  I connected with Corey Perry on 03/29/20 at  8:30 AM EDT by video enabled telemedicine visit and verified that I am speaking with the correct person using two identifiers.   I discussed the limitations, risks, security and privacy concerns of performing an evaluation and management service by telemedicine and the availability of in-person appointments. I also discussed with the patient that there may be a patient responsible charge related to this service. The patient expressed understanding and agreed to proceed.   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  INTERVAL HISTORY: Patient returns to clinic for further evaluation and consideration of cycle 12 of q3 week FOLFOX chemotherapy. Cycle 11 on 03/08/2020 with Dr. [name].  He denies any recent diarrhea, dysphagia.  Appetite and weight have been stable but overall he has had weight loss.  Previously saw dietitian and intermittently consumes nutritional supplements says he is lactose intolerant.  No neurologic complaints.  No falls.  Has had chronic and generalized weakness.  No chest pain, shortness of breath, cough, hemoptysis.  No recent fevers or illness.  No nausea, vomiting, constipation.  He has no melena or hematochezia.  No urinary complaints.   No further specific complaints today.   REVIEW OF SYSTEMS:   Review of Systems  Constitutional: Positive for malaise/fatigue. Negative for fever and weight loss.  HENT: Negative.   Respiratory: Negative.  Negative for cough and shortness of breath.   Cardiovascular: Negative.  Negative for chest pain and leg swelling.  Gastrointestinal: Negative for abdominal pain, blood in stool, constipation, diarrhea, melena, nausea and vomiting.  Genitourinary: Negative.  Negative for frequency and urgency.  Musculoskeletal: Negative.  Negative for back pain.  Skin: Negative.  Negative for rash.  Neurological: Positive for weakness. Negative for tingling, focal weakness and headaches.  Psychiatric/Behavioral: Negative.  The patient is not nervous/anxious and does not have insomnia.    As per HPI. Otherwise, a complete review of systems is negative.  PAST MEDICAL HISTORY: Past Medical History:  Diagnosis Date  . Abnormal white blood cell (WBC) count    seeing Dr. [name] at [place] on 10/25  . Atrial fibrillation ([place])   . BPH (benign prostatic hypertrophy)   . Bradycardia   . CAD (coronary artery disease)   . Dysrhythmia    bradycardia - sees Dr. Humphrey Rolls  . Esophageal cancer ([place]) 05/2016  . GERD (gastroesophageal reflux disease)   . History of kidney stones   . Hyperlipidemia   . Hypertension   . Hypothyroidism   . Kidney stone   . Myocardial infarction ([place]) 2005  . S/P angioplasty with stent 2005   x 4 vessels  . Sleep apnea    mild-NO CPAP  . Spinal stenosis    lumbar    PAST SURGICAL HISTORY: Past Surgical History:  Procedure Laterality Date  . BACK SURGERY    . CARDIAC CATHETERIZATION Left 03/12/2016  Procedure: Left Heart Cath and Coronary Angiography;  Surgeon: [name], MD;  Location: [place] [facility];  Service: Cardiovascular;  Laterality: Left;  . CARDIAC CATHETERIZATION N/A 03/12/2016   Procedure: Coronary Stent Intervention;  Surgeon: Yolonda Kida, MD;   Location: [place] [facility];  Service: Cardiovascular;  Laterality: N/A;  . COLONOSCOPY WITH PROPOFOL N/A 07/13/2015   Procedure: COLONOSCOPY WITH PROPOFOL;  Surgeon: Lucilla Lame, MD;  Location: [place];  Service: Endoscopy;  Laterality: N/A;  . CORONARY ANGIOPLASTY  10/05 and 12/05   4 DES placed  . CYSTOSCOPY W/ RETROGRADES Left 06/15/2019   Procedure: CYSTOSCOPY WITH RETROGRADE PYELOGRAM;  Surgeon: Abbie Sons, MD;  Location: [facility];  Service: Urology;  Laterality: Left;  . CYSTOSCOPY/URETEROSCOPY/HOLMIUM LASER/STENT PLACEMENT Left 06/15/2019   Procedure: CYSTOSCOPY/URETEROSCOPY/HOLMIUM LASER/STENT PLACEMENT;  Surgeon: Abbie Sons, MD;  Location: [facility];  Service: Urology;  Laterality: Left;  . ESOPHAGECTOMY  10/07/2016   @ [facility]  . ESOPHAGOGASTRODUODENOSCOPY (EGD) WITH PROPOFOL N/A 06/14/2016   Procedure: ESOPHAGOGASTRODUODENOSCOPY (EGD) WITH PROPOFOL;  Surgeon: [name], MD;  Location[facility];  Service: Endoscopy;  Laterality: N/A;  . HERNIA REPAIR Bilateral    x3  . INGUINAL HERNIA REPAIR Right 08/28/2017   Large PerFix plug, recurrent hernia;  Surgeon: Robert Bellow, MD;  Location: [facility];  Service: General;  Laterality: Right;  recurrent  . KIDNEY STONE SURGERY  07/15/2019  . POLYPECTOMY  07/13/2015   Procedure: POLYPECTOMY;  Surgeon: Lucilla Lame, MD;  Location: [place];  Service: Endoscopy;;  . PORT-A-CATH REMOVAL N/A 01/16/2017   Procedure: REMOVAL PORT-A-CATH;  Surgeon: Olean Ree, MD;  Location: [facility];  Service: General;  Laterality: N/A;  . PORTACATH PLACEMENT N/A 07/08/2016   Procedure: INSERTION PORT-A-CATH;  Surgeon: Olean Ree, MD;  Location: [facility];  Service: General;  Laterality: N/A;  . PORTACATH PLACEMENT Right 08/30/2019   Procedure: INSERTION PORT-A-CATH;  Surgeon: Olean Ree, MD;  Location: [facility];  Service: General;  Laterality: Right;    FAMILY HISTORY Family History  Problem Relation Age  of Onset  . Hypertension Mother   . Dementia Mother   . Heart disease Father   . Hypertension Father   . Kidney disease Father   . Hypertension Brother   . Heart disease Brother   . Diabetes Son   . Hypertension Son   . Hyperlipidemia Son   . Hyperlipidemia Daughter   . Hypertension Daughter       ADVANCED DIRECTIVES:    HEALTH MAINTENANCE: Social History   Tobacco Use  . Smoking status: Former Smoker    Packs/day: 1.00    Years: 40.00    Pack years: 40.00    Types: Cigarettes    Quit date: 07/16/2004    Years since quitting: 15.7  . Smokeless tobacco: Former Systems developer    Types: Secondary school teacher  . Vaping Use: Never used  Substance Use Topics  . Alcohol use: Yes    Alcohol/week: 1.0 standard drink    Types: 1 Cans of beer per week  . Drug use: No     Colonoscopy: 06/2015  Lipid panel: 10/2019  No Known Allergies  Current Outpatient Medications  Medication Sig Dispense Refill  . acetaminophen (TYLENOL) 500 MG tablet Take 500 mg by mouth every 6 (six) hours as needed for mild pain or moderate pain.    Marland Kitchen aspirin 81 MG tablet Take 81 mg by mouth daily.     Marland Kitchen atorvastatin (LIPITOR) 40  MG tablet Take 1 tablet (40 mg total) by mouth daily. 90 tablet 1  . clopidogrel (PLAVIX) 75 MG tablet Take 1 tablet (75 mg total) by mouth daily. 90 tablet 2  . diphenoxylate-atropine (LOMOTIL) 2.5-0.025 MG tablet Take 2 tablets by mouth 4 (four) times daily as needed for diarrhea or loose stools. 30 tablet 0  . isosorbide mononitrate (IMDUR) 30 MG 24 hr tablet Take 1 tablet (30 mg total) by mouth every evening. 90 tablet 1  . levothyroxine (SYNTHROID) 50 MCG tablet Take 1 tablet (50 mcg total) by mouth daily. 90 tablet 1  . lisinopril (ZESTRIL) 2.5 MG tablet Take 1 tablet (2.5 mg total) by mouth every morning. 90 tablet 1  . loperamide (IMODIUM A-D) 2 MG tablet Take 2 mg by mouth 4 (four) times daily as needed for diarrhea or loose stools.    . metoprolol succinate (TOPROL-XL) 25 MG 24 hr  tablet Take 1 tablet (25 mg total) by mouth daily. 90 tablet 0  . multivitamin-iron-minerals-folic acid (CENTRUM) chewable tablet Chew 1 tablet by mouth daily.    . nitroGLYCERIN (NITROSTAT) 0.4 MG SL tablet Place 1 tablet under the tongue every 5 (five) minutes as needed for chest pain.   98  . ondansetron (ZOFRAN) 8 MG tablet Take 1 tablet (8 mg total) by mouth 2 (two) times daily as needed for refractory nausea / vomiting. Start on day 3 after chemotherapy. 60 tablet 2  . pantoprazole (PROTONIX) 20 MG tablet TAKE ONE TABLET BY MOUTH ONCE DAILY 90 tablet 3  . prochlorperazine (COMPAZINE) 10 MG tablet Take 1 tablet (10 mg total) by mouth every 6 (six) hours as needed (Nausea or vomiting). 60 tablet 2  . amoxicillin-clavulanate (AUGMENTIN) 875-125 MG tablet Take 1 tablet by mouth 2 (two) times daily. (Patient not taking: Reported on 03/29/2020) 20 tablet 0  . azithromycin (ZITHROMAX Z-PAK) 250 MG tablet Take 2 tablets by mouth on first day and then take 1 tablet by mouth daily until finished (Patient not taking: Reported on 03/29/2020) 6 each 0   No current facility-administered medications for this visit.   Facility-Administered Medications Ordered in Other Visits  Medication Dose Route Frequency Provider Last Rate Last Admin  . fluorouracil (ADRUCIL) 4,200 mg in sodium chloride 0.9 % 66 mL chemo infusion  2,400 mg/m2 (Treatment Plan Recorded) Intravenous 1 day or 1 dose [name], MD      . fluorouracil (ADRUCIL) chemo injection 700 mg  400 mg/m2 (Treatment Plan Recorded) Intravenous Once [name], MD      . leucovorin 700 mg in dextrose 5 % 250 mL infusion  398 mg/m2 (Treatment Plan Recorded) Intravenous Once [name], MD 143 mL/hr at 03/29/20 1018 700 mg at 03/29/20 1018  . oxaliplatin (ELOXATIN) 150 mg in dextrose 5 % 500 mL chemo infusion  85 mg/m2 (Treatment Plan Recorded) Intravenous Once [name], MD 265 mL/hr at 03/29/20 1020 150 mg at 03/29/20 1020      OBJECTIVE: Vitals:   03/29/20 0850  BP: 116/83  Pulse: 72  Resp: 20  Temp: (!) 97.5 F (36.4 C)  SpO2: 97%     Body mass index is 19.44 kg/m.      ECOG FS:1 - Symptomatic but completely ambulatory  General: Thin, no acute distress. Exam limited due to telemedicine   LAB RESULTS:  Lab Results  Component Value Date   NA 139 03/29/2020   K 4.6 03/29/2020   CL 107 03/29/2020   CO2 24 03/29/2020  GLUCOSE 89 03/29/2020   BUN 14 03/29/2020   CREATININE 1.21 03/29/2020   CALCIUM 8.3 (L) 03/29/2020   PROT 7.2 03/29/2020   ALBUMIN 2.8 (L) 03/29/2020   AST 27 03/29/2020   ALT 21 03/29/2020   ALKPHOS 83 03/29/2020   BILITOT 0.4 03/29/2020   GFRNONAA 59 (L) 03/29/2020   GFRAA >60 03/29/2020    Lab Results  Component Value Date   WBC 10.5 03/29/2020   NEUTROABS 8.7 (H) 03/29/2020   HGB 8.3 (L) 03/29/2020   HCT 27.4 (L) 03/29/2020   MCV 105.4 (H) 03/29/2020   PLT 224 03/29/2020     STUDIES: No results found.  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  PLAN:    1.  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  Patient had esophagectomy on October 07, 2016.  Imaging at [facility] suggested recurrence which was confirmed by biopsy.  Palliative FOLFOX was recommended and he initiated on September 01, 2019. PET scan results from December 07, 2019 were reviewed independently by Dr. [name] who felt reflective of improvement of patient's overall disease burden but likely residual malignancy.  Currently status post cycle 11.  Labs today reviewed and acceptable for continuation of treatment.  Proceed with cycle 12 today.  We will plan to reimage with PET to assess treatment response.  2.  Diarrhea-question chemotherapy versus high fructose drink induced vs lactose intolerance.  Not bothersome today.   Continue Lomotil as needed  3.  Thrombocytopenia-chemotherapy-induced.  Asymptomatic today.  Resolved with chemotherapy every 3 weeks.  Platelets 224 today.  Continue to monitor with labs.  4.  Renal insufficiency-chronic and unchanged.  Creatinine 1.21 today.  Encouraged fluid hydration.  Continue to monitor with labs.  5.  Anemia-chemotherapy-induced.  Hemoglobin downtrending at 8.3 today.  Previously 8.4.  Consider transfusion for symptoms and hemoglobin less than 8.  Continue to monitor with labs.  6.  Leukocytosis-resolved.  WBC 10.5 today.  Uptrending.  Continue to monitor with labs.  7.  Weight loss-we will refer back to Cyndi Bender, dietitian.  Samples of milk free supplements provided today.  8.  Neuropathy-new problem.  Likely chemotherapy-induced.  Cold sensitivity most bothersome.  Okay to continue chemo today.  Encouraged to keep hands and feet warm.  If persistent problem consider gabapentin or Cymbalta vs others.   Disposition: Proceed with cycle 12 today. RTC in 2 days for pump removal. PET scan in 4 weeks and then follow-up with Dr. [name] with labs (CBC, CMP) following PET.   Patient expressed understanding and was in agreement with this plan. He also understands that He can call clinic at any time with any questions, concerns, or complaints.   I discussed the assessment and treatment plan with the patient. The patient was provided an opportunity to ask questions and all were answered. The patient agreed with the plan and demonstrated an understanding of the instructions.   The patient was advised to call back or seek an in-person evaluation if the symptoms worsen or if the condition fails to improve as anticipated.   I provided 20 minutes of face-to-face video visit time during this encounter, and > 50% was spent counseling as documented under my assessment & plan.   Verlon Au, NP 03/29/20 11:40 AM

## 2020-03-30 NOTE — Addendum Note (Signed)
Addended by: Verlon Au on: 03/30/2020 10:11 AM   Modules accepted: Level of Service

## 2020-03-31 ENCOUNTER — Other Ambulatory Visit: Payer: Self-pay

## 2020-03-31 ENCOUNTER — Inpatient Hospital Stay: Payer: Medicare Other

## 2020-03-31 VITALS — BP 99/61 | HR 58 | Temp 96.7°F | Resp 18

## 2020-03-31 DIAGNOSIS — D6959 Other secondary thrombocytopenia: Secondary | ICD-10-CM | POA: Diagnosis not present

## 2020-03-31 DIAGNOSIS — Z5111 Encounter for antineoplastic chemotherapy: Secondary | ICD-10-CM | POA: Diagnosis not present

## 2020-03-31 DIAGNOSIS — D6481 Anemia due to antineoplastic chemotherapy: Secondary | ICD-10-CM | POA: Diagnosis not present

## 2020-03-31 DIAGNOSIS — T451X5A Adverse effect of antineoplastic and immunosuppressive drugs, initial encounter: Secondary | ICD-10-CM | POA: Diagnosis not present

## 2020-03-31 DIAGNOSIS — Z5189 Encounter for other specified aftercare: Secondary | ICD-10-CM | POA: Diagnosis not present

## 2020-03-31 DIAGNOSIS — C155 Malignant neoplasm of lower third of esophagus: Secondary | ICD-10-CM | POA: Diagnosis not present

## 2020-03-31 MED ORDER — PEGFILGRASTIM-CBQV 6 MG/0.6ML ~~LOC~~ SOSY
6.0000 mg | PREFILLED_SYRINGE | Freq: Once | SUBCUTANEOUS | Status: AC
  Administered 2020-03-31: 6 mg via SUBCUTANEOUS
  Filled 2020-03-31: qty 0.6

## 2020-03-31 MED ORDER — HEPARIN SOD (PORK) LOCK FLUSH 100 UNIT/ML IV SOLN
500.0000 [IU] | Freq: Once | INTRAVENOUS | Status: AC | PRN
  Administered 2020-03-31: 500 [IU]
  Filled 2020-03-31: qty 5

## 2020-03-31 MED ORDER — SODIUM CHLORIDE 0.9% FLUSH
10.0000 mL | INTRAVENOUS | Status: DC | PRN
  Administered 2020-03-31: 10 mL
  Filled 2020-03-31: qty 10

## 2020-03-31 NOTE — Progress Notes (Signed)
1340: Reviewed labs (WBC 1.5 and ANC 8.7) from 03/29/20 with Beckey Rutter NP. Per Beckey Rutter NP proceed with Udenyca injection as scheduled at this time.

## 2020-04-03 ENCOUNTER — Other Ambulatory Visit: Payer: Self-pay | Admitting: Family Medicine

## 2020-04-03 DIAGNOSIS — C155 Malignant neoplasm of lower third of esophagus: Secondary | ICD-10-CM

## 2020-04-03 MED ORDER — CLOPIDOGREL BISULFATE 75 MG PO TABS: 75.0000 mg | ORAL_TABLET | Freq: Every day | ORAL | 0 refills | Status: DC

## 2020-04-03 NOTE — Telephone Encounter (Signed)
Medication Refill - Medication: clopidogrel (PLAVIX) 75 MG tablet    Preferred Pharmacy (with phone number or street name):  [facility], [place] Phone:  [phone]  Fax:  [phone]       Agent: Please be advised that RX refills may take up to 3 business days. We ask that you follow-up with your pharmacy.

## 2020-04-13 ENCOUNTER — Other Ambulatory Visit: Payer: Self-pay

## 2020-04-13 ENCOUNTER — Other Ambulatory Visit: Payer: Medicare Other

## 2020-04-13 NOTE — Progress Notes (Signed)
PATIENT NAME: Corey Perry DOB: 1946-04-14 MRN: [id number]  PRIMARY CARE PROVIDER: Valerie Roys, DO  RESPONSIBLE PARTY:  Acct ID - Guarantor Home Phone Work Phone Relationship Acct Type  [id number] Corey Perry,* [phone]  Self P/F     [address], [place], [place] [address]    PLAN OF CARE and INTERVENTIONS:               1.  GOALS OF CARE/ ADVANCE CARE PLANNING:  Remain at home with wife and continue to receive treatment.               2.  PATIENT/CAREGIVER EDUCATION: Education on s/s of infection, education on fall precautions, reviewed meds, support                3.  DISEASE STATUS:  RN made scheduled palliative care home visit. Nurse met with patient and his wife Corey Perry. Patient reports he received chemo treatment 2 weeks ago and has completed 12 rounds of chemo.  Patient states Dr [name] wants to wait at least 4  weeks after his last chemo before receiving scan to see if chemo is working. Patient reports he has no energy and feels week. Patient denies having any pain at the present time. Patient reports his current weight is 134 pounds.  Patient states when he eats he continues to have diarrhea. Patient reports when he eats foods with sugar diarrhea is worse. Patient reports having shortness of breath on exertion. Patient reports he was a former smoker and does have a little cough which is productive of white sputum. Patient reports he takes Robitussin as needed for cough. Patient has scattered rhonchi throughout left lung fields. Patient reports he has been sleeping fair. Patient states he is having increased numbness in his fingertips and the bottoms of his feet due to side effects of chemo. Patients vital signs are stable. Patient has no edema in his lower extremities. Patient has had no changes in current medications. Patient and wife remain in agreement with palliative care services. Patient and wife encouraged to contact palliative care with questions or concerns.       HISTORY OF PRESENT ILLNESS:  Patient is a 74 year old male who resides in home with his wife.  Patient is followed by palliative care and is seen monthly and PRN.   CODE STATUS: DNR  ADVANCED DIRECTIVES: Y MOST FORM: No PPS: 60%   PHYSICAL EXAM:   VITALS: Today's Vitals   04/13/20 1022  BP: (!) 104/58  Pulse: 70  Resp: 16  Temp: 98.9 F (37.2 C)  TempSrc: Temporal  SpO2: 98%  Weight: 134 lb (60.8 kg)  PainSc: 0-No pain    LUNGS: Rhonchi throughout left lung, decreased breath sounds.  CARDIAC: Cor RRR  EXTREMITIES: none edema SKIN: Skin color, texture, turgor normal. No rashes or lesions  NEURO: positive for neuropathy in fingers and feet / weakness       [name], RN

## 2020-04-18 ENCOUNTER — Other Ambulatory Visit: Payer: Self-pay

## 2020-04-18 ENCOUNTER — Other Ambulatory Visit: Payer: Medicare Other

## 2020-04-18 DIAGNOSIS — C155 Malignant neoplasm of lower third of esophagus: Secondary | ICD-10-CM

## 2020-04-18 NOTE — Progress Notes (Signed)
   Subjective:    Patient ID: Corey Perry, male    DOB: 1946/07/27, 74 y.o.   MRN: [id number]  HPI Reviewed patient's recent completion of chemo treatment. Plan for repeat scan this month.   Review of Systems     Objective:   Physical Exam Deferred due to telephonic visit       Assessment & Plan:  Spoke with patient who reports he is eating better since he finished chemo in July.  No falls.  Has PCP appointment on 8/16.  He is feeling stronger and has more energy since completing chemo.  Plans for scan this month. Diarrhea has improved as patient discovered that It correlated with high sugar drinks.  He has since switched to unsweet tea and water only with signs of improvement. Continues to have some cough with occasional white phlegm. Denies any medication changes or other concerns at this time.  Patient will call if anything changes this month.

## 2020-04-19 NOTE — Progress Notes (Signed)
BP 97/61   Pulse 66    Subjective:    Patient ID: Corey Perry, male    DOB: 1945-10-13, 74 y.o.   MRN: [id number]  HPI: Corey Perry is a 74 y.o. male  Chief Complaint  Patient presents with  . URI    pt's wife states that the patient has had cough, runny nose, and congestion for the last 3 days    UPPER RESPIRATORY TRACT INFECTION Onset: 2-3 days ago Worst symptom: pressure in chest on right side Fever: no Cough: yes; productive Shortness of breath: no Wheezing: no Chest pain: yes, with cough describes as "pressure" Chest tightness: no Chest congestion: yes Nasal congestion: no Runny nose: yes; runs all the time since quit smoking Post nasal drip: no Sneezing: no Sore throat: no Swollen glands: no Sinus pressure: no Headache: no Face pain: no Toothache: no Ear pain: no  Ear pressure: no  Eyes red/itching:no Eye drainage/crusting: no  Nausea: yes Vomiting: yes - since chemo started Diarrhea: yes - from chemo Rash: no Fatigue: yes Sick contacts: no Strep contacts: no  Context: better Recurrent sinusitis: no Relief with OTC cold/cough medications: yes Treatments attempted:  Robitussin   No Known Allergies  Outpatient Encounter Medications as of 04/20/2020  Medication Sig Note  . acetaminophen (TYLENOL) 500 MG tablet Take 500 mg by mouth every 6 (six) hours as needed for mild pain or moderate pain.   Marland Kitchen aspirin 81 MG tablet Take 81 mg by mouth daily.    Marland Kitchen atorvastatin (LIPITOR) 40 MG tablet Take 1 tablet (40 mg total) by mouth daily.   . clopidogrel (PLAVIX) 75 MG tablet Take 1 tablet (75 mg total) by mouth daily.   . diphenoxylate-atropine (LOMOTIL) 2.5-0.025 MG tablet Take 2 tablets by mouth 4 (four) times daily as needed for diarrhea or loose stools.   . isosorbide mononitrate (IMDUR) 30 MG 24 hr tablet Take 1 tablet (30 mg total) by mouth every evening.   Marland Kitchen levothyroxine (SYNTHROID) 50 MCG tablet Take 1 tablet (50 mcg total) by mouth daily.     Marland Kitchen lisinopril (ZESTRIL) 2.5 MG tablet Take 1 tablet (2.5 mg total) by mouth every morning.   . loperamide (IMODIUM A-D) 2 MG tablet Take 2 mg by mouth 4 (four) times daily as needed for diarrhea or loose stools.   . metoprolol succinate (TOPROL-XL) 25 MG 24 hr tablet Take 1 tablet (25 mg total) by mouth daily.   . multivitamin-iron-minerals-folic acid (CENTRUM) chewable tablet Chew 1 tablet by mouth daily.   . nitroGLYCERIN (NITROSTAT) 0.4 MG SL tablet Place 1 tablet under the tongue every 5 (five) minutes as needed for chest pain.    Marland Kitchen ondansetron (ZOFRAN) 8 MG tablet Take 1 tablet (8 mg total) by mouth 2 (two) times daily as needed for refractory nausea / vomiting. Start on day 3 after chemotherapy. 08/26/2019: Have not started yet  . pantoprazole (PROTONIX) 20 MG tablet TAKE ONE TABLET BY MOUTH ONCE DAILY   . prochlorperazine (COMPAZINE) 10 MG tablet Take 1 tablet (10 mg total) by mouth every 6 (six) hours as needed (Nausea or vomiting). 08/26/2019: Have not started yet  . [DISCONTINUED] amoxicillin-clavulanate (AUGMENTIN) 875-125 MG tablet Take 1 tablet by mouth 2 (two) times daily. (Patient not taking: Reported on 03/29/2020)   . [DISCONTINUED] azithromycin (ZITHROMAX Z-PAK) 250 MG tablet Take 2 tablets by mouth on first day and then take 1 tablet by mouth daily until finished (Patient not taking: Reported on 03/29/2020)  No facility-administered encounter medications on file as of 04/20/2020.   Patient Active Problem List   Diagnosis Date Noted  . Upper respiratory tract infection 04/20/2020  . Cough 04/20/2020  . Diarrhea 11/10/2019  . Atrial fibrillation ([place])   . Senile purpura ([place]) 08/27/2018  . Advanced care planning/counseling discussion 07/29/2017  . Recurrent inguinal hernia of right side without obstruction or gangrene 07/29/2017  . Kidney stones 02/02/2017  . Esophageal cancer ([place]) 06/23/2016  . Leukopenia 04/10/2016  . Thrombocytopenia ([place]) 04/10/2016  . Hypertensive  kidney disease with CKD stage III 06/22/2015  . CAD (coronary artery disease) 06/21/2015  . BPH (benign prostatic hyperplasia) 06/21/2015  . Acid reflux 06/21/2015  . Hypothyroid   . Hyperlipidemia    Past Medical History:  Diagnosis Date  . Abnormal white blood cell (WBC) count    seeing Dr. [name] at [place] on 10/25  . Atrial fibrillation ([place])   . BPH (benign prostatic hypertrophy)   . Bradycardia   . CAD (coronary artery disease)   . Dysrhythmia    bradycardia - sees Dr. Humphrey Rolls  . Esophageal cancer ([place]) 05/2016  . GERD (gastroesophageal reflux disease)   . History of kidney stones   . Hyperlipidemia   . Hypertension   . Hypothyroidism   . Kidney stone   . Myocardial infarction ([place]) 2005  . S/P angioplasty with stent 2005   x 4 vessels  . Sleep apnea    mild-NO CPAP  . Spinal stenosis    lumbar   Relevant past medical, surgical, family and social history reviewed and updated as indicated. Interim medical history since our last visit reviewed.  Review of Systems  Constitutional: Positive for activity change, appetite change and fatigue. Negative for chills, diaphoresis and fever.  HENT: Positive for congestion and rhinorrhea. Negative for ear discharge, ear pain, postnasal drip, sinus pressure, sinus pain, sneezing and sore throat.   Eyes: Negative.  Negative for pain, discharge, redness and itching.  Respiratory: Positive for cough. Negative for chest tightness, shortness of breath and wheezing.   Cardiovascular: Positive for chest pain (describes as "pressure" when coughing).  Gastrointestinal: Positive for diarrhea (chronic, due to chemo), nausea (chronic, due to chemo) and vomiting (chronic, due to chemo).  Skin: Negative.  Negative for rash.  Neurological: Negative.  Negative for dizziness, light-headedness, numbness and headaches.  Psychiatric/Behavioral: Negative.  Negative for confusion and decreased concentration. The patient is not nervous/anxious.    Per  HPI unless specifically indicated above     Objective:    BP 97/61   Pulse 66   Wt Readings from Last 3 Encounters:  04/13/20 134 lb (60.8 kg)  03/29/20 130 lb 8 oz (59.2 kg)  03/08/20 134 lb 4.8 oz (60.9 kg)    Physical Exam Physical exam unable to be performed due to lack of equipment.    Assessment & Plan:   Problem List Items Addressed This Visit      Respiratory   Upper respiratory tract infection - Primary    Acute, ongoing.  Symptoms likely due to viral cause at this point, will obtain chest x-ray now due to immunocompromised status - on chemotherapy for esophageal cancer.  Continue supportive care with increased hydration, tylenol, robitussin, symptomatic management.  Encouraged COVID testing to rule out. Patient to return to clinic if symptoms persist >10 days.       Relevant Orders   DG Chest 2 View   Novel Coronavirus, NAA ([facility])     Other   Cough  Acute, ongoing.  Likely viral at this point, however will obtain chest x-ray now due to immunocompromised status - on chemotherapy for esophageal cancer.  Continue supportive care with increased hydration, tylenol, robitussin, symptomatic management.  Encouraged COVID testing to rule out. Patient to return to clinic if symptoms persist >10 days.       Relevant Orders   DG Chest 2 View       Follow up plan: Return if symptoms worsen or fail to improve.   This visit was completed via telephone due to the restrictions of the COVID-19 pandemic. All issues as above were discussed and addressed but no physical exam was performed. If it was felt that the patient should be evaluated in the office, they were directed there. The patient verbally consented to this visit. Patient was unable to complete an audio/visual visit due to Lack of equipment. . Location of the patient: home . Location of the provider: work . Those involved with this call:  . Provider: Carnella Guadalajara, DNP . CMA: Yvonna Alanis, CMA . Front  Desk/Registration: Don Perking  . Time spent on call: 16 minutes on the phone discussing health concerns. 30 minutes total spent in review of patient's record and preparation of their chart.  I verified patient identity using two factors (patient name and date of birth). Patient consents verbally to being seen via telemedicine visit today.

## 2020-04-19 NOTE — Patient Instructions (Signed)

## 2020-04-20 ENCOUNTER — Encounter: Payer: Self-pay | Admitting: Nurse Practitioner

## 2020-04-20 ENCOUNTER — Ambulatory Visit (INDEPENDENT_AMBULATORY_CARE_PROVIDER_SITE_OTHER): Payer: Medicare Other | Admitting: Nurse Practitioner

## 2020-04-20 VITALS — BP 97/61 | HR 66

## 2020-04-20 DIAGNOSIS — J069 Acute upper respiratory infection, unspecified: Secondary | ICD-10-CM | POA: Diagnosis not present

## 2020-04-20 DIAGNOSIS — R059 Cough, unspecified: Secondary | ICD-10-CM | POA: Insufficient documentation

## 2020-04-20 DIAGNOSIS — R05 Cough: Secondary | ICD-10-CM

## 2020-04-20 NOTE — Assessment & Plan Note (Signed)
Acute, ongoing.  Symptoms likely due to viral cause at this point, will obtain chest x-ray now due to immunocompromised status - on chemotherapy for esophageal cancer.  Continue supportive care with increased hydration, tylenol, robitussin, symptomatic management.  Encouraged COVID testing to rule out. Patient to return to clinic if symptoms persist >10 days.

## 2020-04-20 NOTE — Assessment & Plan Note (Signed)
Acute, ongoing.  Likely viral at this point, however will obtain chest x-ray now due to immunocompromised status - on chemotherapy for esophageal cancer.  Continue supportive care with increased hydration, tylenol, robitussin, symptomatic management.  Encouraged COVID testing to rule out. Patient to return to clinic if symptoms persist >10 days.

## 2020-04-21 ENCOUNTER — Ambulatory Visit
Admission: RE | Admit: 2020-04-21 | Discharge: 2020-04-21 | Disposition: A | Discharge status: Home / Self Care | Payer: Medicare Other | Attending: Nurse Practitioner | Admitting: Nurse Practitioner

## 2020-04-21 ENCOUNTER — Telehealth: Payer: Self-pay | Admitting: Nurse Practitioner

## 2020-04-21 ENCOUNTER — Other Ambulatory Visit: Payer: Self-pay

## 2020-04-21 ENCOUNTER — Ambulatory Visit
Admission: RE | Admit: 2020-04-21 | Discharge: 2020-04-21 | Disposition: A | Discharge status: Home / Self Care | Payer: Medicare Other | Source: Ambulatory Visit | Attending: Nurse Practitioner | Admitting: Nurse Practitioner

## 2020-04-21 DIAGNOSIS — R911 Solitary pulmonary nodule: Secondary | ICD-10-CM | POA: Diagnosis not present

## 2020-04-21 DIAGNOSIS — J069 Acute upper respiratory infection, unspecified: Secondary | ICD-10-CM | POA: Diagnosis not present

## 2020-04-21 DIAGNOSIS — R059 Cough, unspecified: Secondary | ICD-10-CM

## 2020-04-21 DIAGNOSIS — J90 Pleural effusion, not elsewhere classified: Secondary | ICD-10-CM | POA: Diagnosis not present

## 2020-04-21 DIAGNOSIS — R05 Cough: Secondary | ICD-10-CM | POA: Insufficient documentation

## 2020-04-21 DIAGNOSIS — J189 Pneumonia, unspecified organism: Secondary | ICD-10-CM | POA: Diagnosis not present

## 2020-04-21 MED ORDER — DOXYCYCLINE HYCLATE 100 MG PO TABS
100.0000 mg | ORAL_TABLET | Freq: Two times a day (BID) | ORAL | 0 refills | Status: DC
Start: 2020-04-21 — End: 2020-05-01

## 2020-04-21 MED ORDER — AMOXICILLIN-POT CLAVULANATE 875-125 MG PO TABS: 1.0000 | ORAL_TABLET | Freq: Two times a day (BID) | ORAL | 0 refills | Status: AC

## 2020-04-21 NOTE — Telephone Encounter (Signed)
Called and spoke with wife per [facility] regarding chest x-ray results.  Chest x-rays showed pneumonia, recommend COVID testing and will send antibiotics to start in to pharmacy.  Follow up early next week to see how doing.

## 2020-04-21 NOTE — Progress Notes (Signed)
[place]  [phone] [phone]  ID: Corey Perry OB: 08/16/1946  MR#: [id number]  GUY#:[id number]  Patient Care Team: Valerie Roys, DO as PCP - General (Family Medicine) [name], MD as PCP - Family Medicine (Family Medicine) [name], MD as Consulting Physician (Oncology) [name], MD as Referring Physician [name], MD as Consulting Physician (Cardiology)  CHIEF COMPLAINT: Recurrent adenocarcinoma of the lower third of the esophagus.  INTERVAL HISTORY: Patient returns to clinic today for further evaluation and discussion of his imaging results.  His weakness and fatigue has improved. He continues to have occasional diarrhea, but this is well controlled with Lomotil.  He otherwise feels well. He does not complain of dysphagia or difficulty swallowing.  His appetite has improved and he has gained weight in the interim.  He has no neurologic complaints.  He denies any recent fevers or illnesses.  He denies any chest pain, shortness of breath, cough, or hemoptysis.  He denies any nausea, vomiting, or constipation. He has no melena or hematochezia.  He has no urinary complaints.  Patient offers no further specific complaints today.  REVIEW OF SYSTEMS:   Review of Systems  Constitutional: Positive for malaise/fatigue. Negative for fever and weight loss.  HENT: Negative.   Respiratory: Negative.  Negative for cough and shortness of breath.   Cardiovascular: Negative.  Negative for chest pain and leg swelling.  Gastrointestinal: Positive for diarrhea. Negative for abdominal pain, blood in stool, constipation, melena, nausea and vomiting.  Genitourinary: Negative.  Negative for frequency and urgency.  Musculoskeletal: Negative.  Negative for back pain.  Skin: Negative.  Negative for rash.  Neurological: Positive for weakness. Negative for tingling, focal weakness and headaches.  Psychiatric/Behavioral:  Negative.  The patient is not nervous/anxious and does not have insomnia.     As per HPI. Otherwise, a complete review of systems is negative.  PAST MEDICAL HISTORY: Past Medical History:  Diagnosis Date  . Abnormal white blood cell (WBC) count    seeing Dr. [name] at [facility] on 10/25  . Atrial fibrillation ([place])   . BPH (benign prostatic hypertrophy)   . Bradycardia   . CAD (coronary artery disease)   . Dysrhythmia    bradycardia - sees Dr. Humphrey Rolls  . Esophageal cancer ([place]) 05/2016  . GERD (gastroesophageal reflux disease)   . History of kidney stones   . Hyperlipidemia   . Hypertension   . Hypothyroidism   . Kidney stone   . Myocardial infarction ([place]) 2005  . S/P angioplasty with stent 2005   x 4 vessels  . Sleep apnea    mild-NO CPAP  . Spinal stenosis    lumbar    PAST SURGICAL HISTORY: Past Surgical History:  Procedure Laterality Date  . BACK SURGERY    . CARDIAC CATHETERIZATION Left 03/12/2016   Procedure: Left Heart Cath and Coronary Angiography;  Surgeon: [name], MD;  Location: [facility];  Service: Cardiovascular;  Laterality: Left;  . CARDIAC CATHETERIZATION N/A 03/12/2016   Procedure: Coronary Stent Intervention;  Surgeon: Yolonda Kida, MD;  Location: [facility];  Service: Cardiovascular;  Laterality: N/A;  . COLONOSCOPY WITH PROPOFOL N/A 07/13/2015   Procedure: COLONOSCOPY WITH PROPOFOL;  Surgeon: Lucilla Lame, MD;  Location: [facility];  Service: Endoscopy;  Laterality: N/A;  . CORONARY ANGIOPLASTY  10/05 and 12/05   4 DES placed  . CYSTOSCOPY W/ RETROGRADES Left 06/15/2019   Procedure: CYSTOSCOPY WITH  RETROGRADE PYELOGRAM;  Surgeon: Abbie Sons, MD;  Location: [facility];  Service: Urology;  Laterality: Left;  . CYSTOSCOPY/URETEROSCOPY/HOLMIUM LASER/STENT PLACEMENT Left 06/15/2019   Procedure: CYSTOSCOPY/URETEROSCOPY/HOLMIUM LASER/STENT PLACEMENT;  Surgeon: Abbie Sons, MD;  Location: [facility];  Service:  Urology;  Laterality: Left;  . ESOPHAGECTOMY  10/07/2016   @ [facility]  . ESOPHAGOGASTRODUODENOSCOPY (EGD) WITH PROPOFOL N/A 06/14/2016   Procedure: ESOPHAGOGASTRODUODENOSCOPY (EGD) WITH PROPOFOL;  Surgeon: [name], MD;  Location: [facility];  Service: Endoscopy;  Laterality: N/A;  . HERNIA REPAIR Bilateral    x3  . INGUINAL HERNIA REPAIR Right 08/28/2017   Large PerFix plug, recurrent hernia;  Surgeon: Robert Bellow, MD;  Location: [facility];  Service: General;  Laterality: Right;  recurrent  . KIDNEY STONE SURGERY  07/15/2019  . POLYPECTOMY  07/13/2015   Procedure: POLYPECTOMY;  Surgeon: Lucilla Lame, MD;  Location: [facility];  Service: Endoscopy;;  . PORT-A-CATH REMOVAL N/A 01/16/2017   Procedure: REMOVAL PORT-A-CATH;  Surgeon: Olean Ree, MD;  Location: [facility];  Service: General;  Laterality: N/A;  . PORTACATH PLACEMENT N/A 07/08/2016   Procedure: INSERTION PORT-A-CATH;  Surgeon: Olean Ree, MD;  Location: [facility];  Service: General;  Laterality: N/A;  . PORTACATH PLACEMENT Right 08/30/2019   Procedure: INSERTION PORT-A-CATH;  Surgeon: Olean Ree, MD;  Location: [facility];  Service: General;  Laterality: Right;    FAMILY HISTORY Family History  Problem Relation Age of Onset  . Hypertension Mother   . Dementia Mother   . Heart disease Father   . Hypertension Father   . Kidney disease Father   . Hypertension Brother   . Heart disease Brother   . Diabetes Son   . Hypertension Son   . Hyperlipidemia Son   . Hyperlipidemia Daughter   . Hypertension Daughter        ADVANCED DIRECTIVES:    HEALTH MAINTENANCE: Social History   Tobacco Use  . Smoking status: Former Smoker    Packs/day: 1.00    Years: 40.00    Pack years: 40.00    Types: Cigarettes    Quit date: 07/16/2004    Years since quitting: 15.7  . Smokeless tobacco: Former Systems developer    Types: Secondary school teacher  . Vaping Use: Never used  Substance Use Topics  . Alcohol use: Yes     Alcohol/week: 1.0 standard drink    Types: 1 Cans of beer per week  . Drug use: No     Colonoscopy:  PAP:  Bone density:  Lipid panel:  No Known Allergies  Current Outpatient Medications  Medication Sig Dispense Refill  . acetaminophen (TYLENOL) 500 MG tablet Take 500 mg by mouth every 6 (six) hours as needed for mild pain or moderate pain.    Marland Kitchen aspirin 81 MG tablet Take 81 mg by mouth daily.     Marland Kitchen atorvastatin (LIPITOR) 40 MG tablet Take 1 tablet (40 mg total) by mouth daily. 90 tablet 1  . clopidogrel (PLAVIX) 75 MG tablet Take 1 tablet (75 mg total) by mouth daily. 90 tablet 0  . doxycycline (VIBRA-TABS) 100 MG tablet Take 1 tablet (100 mg total) by mouth 2 (two) times daily. 10 tablet 0  . isosorbide mononitrate (IMDUR) 30 MG 24 hr tablet Take 1 tablet (30 mg total) by mouth every evening. 90 tablet 1  . levothyroxine (SYNTHROID) 50 MCG tablet Take 1 tablet (50 mcg total) by mouth daily. 90 tablet 1  . lisinopril (ZESTRIL) 2.5 MG tablet Take  1 tablet (2.5 mg total) by mouth every morning. 90 tablet 1  . loperamide (IMODIUM A-D) 2 MG tablet Take 2 mg by mouth 4 (four) times daily as needed for diarrhea or loose stools.     . metoprolol succinate (TOPROL-XL) 25 MG 24 hr tablet Take 1 tablet (25 mg total) by mouth daily. 90 tablet 0  . multivitamin-iron-minerals-folic acid (CENTRUM) chewable tablet Chew 1 tablet by mouth daily.    . nitroGLYCERIN (NITROSTAT) 0.4 MG SL tablet Place 1 tablet under the tongue every 5 (five) minutes as needed for chest pain.   98  . pantoprazole (PROTONIX) 20 MG tablet TAKE ONE TABLET BY MOUTH ONCE DAILY 90 tablet 3  . diphenoxylate-atropine (LOMOTIL) 2.5-0.025 MG tablet Take 2 tablets by mouth 4 (four) times daily as needed for diarrhea or loose stools. (Patient not taking: Reported on 04/28/2020) 30 tablet 0  . ondansetron (ZOFRAN) 8 MG tablet Take 1 tablet (8 mg total) by mouth 2 (two) times daily as needed for refractory nausea / vomiting. Start on day 3  after chemotherapy. (Patient not taking: Reported on 04/28/2020) 60 tablet 2  . prochlorperazine (COMPAZINE) 10 MG tablet Take 1 tablet (10 mg total) by mouth every 6 (six) hours as needed (Nausea or vomiting). (Patient not taking: Reported on 04/28/2020) 60 tablet 2   No current facility-administered medications for this visit.    OBJECTIVE: Vitals:   04/28/20 1024  BP: 104/74  Pulse: 68  Resp: 18  Temp: 97.7 F (36.5 C)  SpO2: 100%     Body mass index is 19.17 kg/m.    ECOG FS:1 - Symptomatic but completely ambulatory  General: Thin, no acute distress. Eyes: Pink conjunctiva, anicteric sclera. HEENT: Normocephalic, moist mucous membranes. Lungs: No audible wheezing or coughing. Heart: Regular rate and rhythm. Abdomen: Soft, nontender, no obvious distention. Musculoskeletal: No edema, cyanosis, or clubbing. Neuro: Alert, answering all questions appropriately. Cranial nerves grossly intact. Skin: No rashes or petechiae noted. Psych: Normal affect.   LAB RESULTS:  Lab Results  Component Value Date   NA 139 03/29/2020   K 4.6 03/29/2020   CL 107 03/29/2020   CO2 24 03/29/2020   GLUCOSE 89 03/29/2020   BUN 14 03/29/2020   CREATININE 1.21 03/29/2020   CALCIUM 8.3 (L) 03/29/2020   PROT 7.2 03/29/2020   ALBUMIN 2.8 (L) 03/29/2020   AST 27 03/29/2020   ALT 21 03/29/2020   ALKPHOS 83 03/29/2020   BILITOT 0.4 03/29/2020   GFRNONAA 59 (L) 03/29/2020   GFRAA >60 03/29/2020    Lab Results  Component Value Date   WBC 10.5 03/29/2020   NEUTROABS 8.7 (H) 03/29/2020   HGB 8.3 (L) 03/29/2020   HCT 27.4 (L) 03/29/2020   MCV 105.4 (H) 03/29/2020   PLT 224 03/29/2020     STUDIES: DG Chest 2 View  Result Date: 04/21/2020 CLINICAL DATA:  Cough. EXAM: CHEST - 2 VIEW COMPARISON:  PET-CT 12/07/2019.  Chest x-ray 08/30/2019. FINDINGS: PowerPort catheter in stable position. Prior gastric pull-through, best identified by prior PET CT. Previously identified pulmonary nodules best  identified by prior PET-CT. Multifocal right mid and lower lung infiltrates. Small right pleural effusion. No pneumothorax. No acute bony abnormality. IMPRESSION: 1.  PowerPort catheter stable position. 2. Multifocal right mid and lower lung infiltrates. Pneumonia could present this fashion. Aspiration cannot be excluded. Small right pleural effusion. 3. Prior gastric pull-through, best identified by prior PET CT. Previously identified pulmonary nodules best identified PET-CT. Electronically Signed   By: Marcello Moores  Register   On: 04/21/2020 14:04   NM PET Image Restag (PS) Skull Base To Thigh  Result Date: 04/27/2020 CLINICAL DATA:  Subsequent treatment strategy for lower esophageal cancer. Prior neoadjuvant chemo radiotherapy and esophagectomy in 2018, subsequent recurrence. The patient finished a course of 12 rounds of FOLFOX chemotherapy 4 weeks ago and was recently treated for right-sided pneumonia. EXAM: NUCLEAR MEDICINE PET SKULL BASE TO THIGH TECHNIQUE: 6.7 mCi F-18 FDG was injected intravenously. Full-ring PET imaging was performed from the skull base to thigh after the radiotracer. CT data was obtained and used for attenuation correction and anatomic localization. Fasting blood glucose: 127 mg/dl COMPARISON:  Multiple exams, including 11/09/2019 FINDINGS: Mediastinal blood pool activity: SUV max 1.8 Liver activity: SUV max 2.8 NECK: No significant abnormal hypermetabolic activity in this region. Incidental CT findings: Bilateral common carotid atherosclerotic calcification CHEST: New patchy and nodular airspace opacities the right lower lobe to a lesser extent in the right middle lobe demonstrate accentuated metabolic activity. Example, the right lower lobe airspace opacity has a representative maximum SUV of 3.9 and a 1.7 by 1.2 cm nodular focus of opacity medially in the right middle lobe 4.5. There is also reticulonodular opacity in the right upper lobe for example on image 99/3 in a pattern  characteristic for atypical infection, with faint accentuated activity. Given the patient's history, the constellation of findings favors pneumonia, although follow up imaging for surveillance is likely warranted. Prior similar opacities in different locations in the right upper lobe and left lower lobe on the prior PET-CT from March 2021 have intervally resolved. Incidental CT findings: Esophagectomy with gastric pull-through. Right Port-A-Cath tip: SVC. Coronary, aortic arch, and branch vessel atherosclerotic vascular disease. Scattered reticulonodular opacities in the left upper lobe are likely infectious. Trace right pleural effusion, probably exudative. ABDOMEN/PELVIS: As before, there is indistinctly marginated soft tissue density surrounding the celiac trunk causing poor visibility of the diaphragmatic crura and ill definition of tissue planes along the adjacent pancreas. In the vicinity of the presumed duodenal bulb just below the diaphragm, and in the area of this soft tissue density, there is mildly accentuated activity with maximum SUV of 4.6, previously about 3.6 in the same region. Some of the indistinctness of tissue planes could be related to therapy if radiation therapy is been utilized. Clearly, low-grade residual tumor in this vicinity is a distinct possibility. No significant change in contour of the indistinct soft tissue in this region. No appreciable hypermetabolic hepatic lesion or other worrisome hypermetabolic lesion identified in the abdomen/pelvis. Incidental CT findings: Aortoiliac atherosclerotic vascular disease. Nonobstructive left nephrolithiasis. Infrarenal abdominal aortic ectasia at 2.9 cm diameter. Hazy subcutaneous and mesenteric stranding. Sigmoid diverticulosis. SKELETON: No significant abnormal hypermetabolic activity in this region. Incidental CT findings: Mild chronic findings of avascular necrosis in both femoral heads. Grade 1 degenerative anterolisthesis of L4 on L5 with  posterior decompression at this level. IMPRESSION: 1. Roughly similar appearance of indistinctly marginated soft tissue density along the celiac trunk and anterior portion of the upper abdominal aorta, also obscuring the margin of the adjacent pancreas. Some of this may well be treatment related, but a component of a low-grade residual malignancy is difficult to exclude. Maximum SUV in this vicinity is 4.6 (previously 3.6) although this measurement may include the duodenal bulb and thus could include some physiologic activity. I do not observe significant progression of the soft tissue density. 2. Some of the prior regions of infection in the lungs shown on the PET-CT from March 2021 have  resolved. However, there are new regions of multilobar pneumonia and/or atypical infection especially concentrated in the right lower lobe, with moderate associated metabolic activity. Surveillance imaging may be warranted to ensure complete resolution. 3. No findings of hepatic metastatic disease. 4. Other imaging findings of potential clinical significance: Esophagectomy with gastric pull-through. Aortic Atherosclerosis (ICD10-I70.0). Coronary atherosclerosis. Trace right pleural effusion. Chronic bilateral femoral head avascular necrosis. Nonobstructive left nephrolithiasis. Ectatic abdominal aorta. Sigmoid diverticulosis. Electronically Signed   By: Van Clines M.D.   On: 04/27/2020 15:51    ASSESSMENT: Recurrent adenocarcinoma of the lower third of the esophagus.  PLAN:    1.  Recurrent adenocarcinoma of the lower third of the esophagus: Patient initially completed 6 cycles of weekly carboplatinum and Taxol on August 21, 2016 and daily XRT completed on August 27, 2016. Patient had his esophagectomy on October 07, 2016.  Patient was then noted to have recurrence and subsequently underwent 12 cycles of FOLFOX completing on March 31, 2020.  PET scan results from April 27, 2020 reviewed independently and report as  above with no significant findings of recurrent or progressive disease, although there may be residual disease along the celiac trunk.  After lengthy discussion with the patient, is agreed upon to not pursue additional treatment at this time.  He acknowledged that he likely will need additional treatment in the future.  Return to clinic in 3 months with repeat PET scan and further evaluation.   2.  Diarrhea: Improved since patient discontinued high fructose drinks.  Continue Lomotil as needed. 3.  Thrombocytopenia: Resolved.  Patient will require treatment every 3 weeks when it is reinitiated. 4.  Renal insufficiency: Improved, monitor.  Encouraged increased fluid intake.  Monitor. 5.  Anemia: Chronic and unchanged.  Patient's most recent hemoglobin is 11.3. 6.  Leukocytosis: Resolved.  Patient expressed understanding and was in agreement with this plan. He also understands that He can call clinic at any time with any questions, concerns, or complaints.    [name], MD 04/29/20 1:55 PM

## 2020-04-24 NOTE — Telephone Encounter (Signed)
Pt scheduled for 04/25/20 for virtual, Pt did not have covid test done, Pt verbalized and confirmed understating.

## 2020-04-24 NOTE — Telephone Encounter (Signed)
Thank you :)

## 2020-04-24 NOTE — Telephone Encounter (Signed)
If they would be willing to come in sometime this week for pneumonia f/u, this would be preferred.  Need to keep f/u with PCP for regular visit

## 2020-04-24 NOTE — Telephone Encounter (Signed)
Pt has apt with PCP on 05/01/2020.

## 2020-04-25 ENCOUNTER — Telehealth (INDEPENDENT_AMBULATORY_CARE_PROVIDER_SITE_OTHER): Payer: Medicare Other | Admitting: Nurse Practitioner

## 2020-04-25 ENCOUNTER — Other Ambulatory Visit: Payer: Self-pay

## 2020-04-25 ENCOUNTER — Ambulatory Visit: Payer: Medicare Other | Admitting: Family Medicine

## 2020-04-25 ENCOUNTER — Encounter: Payer: Self-pay | Admitting: Nurse Practitioner

## 2020-04-25 DIAGNOSIS — J189 Pneumonia, unspecified organism: Secondary | ICD-10-CM

## 2020-04-25 NOTE — Assessment & Plan Note (Signed)
Acute, improving symptomaticaly.  Diagnosed 8/6, taking amox-clav and doxy without adverse side effects.  Cough much improved with robitussin, can continue for now.  Encouraged to take daily probiotic to help prevent diarrhea.  Has scheduled appointment with PCP early next week, advised to keep that appointment.

## 2020-04-25 NOTE — Patient Instructions (Signed)
Aspiration Pneumonia Aspiration pneumonia is an infection in the lungs. It occurs when saliva or liquid contaminated with bacteria is inhaled (aspirated) into the lungs. When these things get into the lungs, swelling (inflammation) and infection can occur. This can make it difficult to breathe. Aspiration pneumonia is a serious condition and can be life threatening. What are the causes? This condition is caused when saliva or liquid from the mouth, throat, or stomach is inhaled into the lungs, and when those fluids are contaminated with bacteria. What increases the risk? The following factors may make you more likely to develop this condition:  A narrowing of the tube that carries food to the stomach (esophageal narrowing).  Having gastroesophageal reflux disease (GERD).  Having a weak immune system.  Having diabetes.  Having poor oral hygiene.  Being malnourished. The condition is more likely to occur when a person's cough (gag) reflex, or ability to swallow, has decreased. Some things that can cause this decrease include:  Having a brain injury or disease, such as stroke, seizures, Parkinson disease, dementia, or amyotrophic lateral sclerosis (ALS).  Being given a general anesthetic for procedures.  Drinking too much alcohol. If a person passes out and vomits, vomit can be inhaled into the lungs.  Taking certain medicines, such as tranquilizers or sedatives. What are the signs or symptoms? Symptoms of this condition include:  Fever.  A cough with secretions that are yellow, tan, or green.  Breathing problems, such as wheezing or shortness of breath.  Chest pain.  Being more tired than usual (fatigue).  Having a history of coughing while eating or drinking.  Bad breath.  Bluish color to the lips, skin, or fingers. How is this diagnosed? This condition may be diagnosed based on:  A physical exam.  Tests, such as: ? Chest X-ray. ? Sputum culture. Saliva and mucus  (sputum) are collected from the lungs or the tubes that carry air to the lungs (bronchi). The sputum is then tested for bacteria. ? Oximetry. A sensor or clip is placed on areas such as a finger, earlobe, or toe to measure the oxygen level in your blood. ? Blood tests. ? Swallowing study. This test looks at how food is swallowed and whether it goes into your breathing tube (trachea) or esophagus. ? Bronchoscopy. This test uses a flexible tube (bronchoscope) to see inside the lungs. How is this treated? This condition may be treated with:  Medicines. Antibiotic medicine will be given to kill the pneumonia bacteria. Other medicines may also be used to reduce fever or pain.  Breathing assistance and oxygen therapy. Depending on how well you are breathing, you may need to be given oxygen, or you may need breathing support from a breathing machine (ventilator).  Thoracentesis. This is a procedure to remove fluid that has built up in the space between the linings of the chest wall and the lungs.  Feeding tube and diet change. For people who have difficulty swallowing, a feeding tube might be placed in the stomach, or they may be asked to avoid certain food textures or liquids when eating. Follow these instructions at home: Medicines  Take over-the-counter and prescription medicines only as told by your health care provider. ? If you were prescribed an antibiotic medicine, take it as told by your health care provider. Do not stop taking the antibiotic even if you start to feel better. ? Take cough medicine only if you are losing sleep. Cough medicine can prevent your body's natural ability to remove mucus   from your lungs. General instructions  Carefully follow any eating instructions you were given, such as avoiding certain food textures or thickening your liquids. Thickening liquids reduces the risk of developing aspiration pneumonia again.  Use breathing exercises such as postural drainage, deep  breathing, and incentive spirometry to help expel secretions.  Rest as instructed by your health care provider.  Sleep in a semi-upright position at night. Try to sleep in a reclining chair, or place a few pillows under your head.  Do not use any products that contain nicotine or tobacco, such as cigarettes and e-cigarettes. If you need help quitting, ask your health care provider.  Keep all follow-up visits as told by your health care provider. This is important. Contact a health care provider if:  You have a fever.  You have a worsening cough with yellow, tan, or green secretions.  You have coughing while eating or drinking. Get help right away if:  You have worsening shortness of breath, wheezing, or difficulty breathing.  You have chest pain. Summary  Aspiration pneumonia is an infection in the lungs. It is caused when saliva or liquid from the mouth, throat, or stomach is inhaled into the lungs.  Aspiration pneumonia is more likely to occur when a person's cough reflex or ability to swallow has decreased.  Symptoms of aspiration pneumonia include coughing, breathing problems, fever, and chest pain.  Aspiration pneumonia may be treated with antibiotic medicine, other medicines to reduce pain or fever, and breathing assistance or oxygen therapy. This information is not intended to replace advice given to you by your health care provider. Make sure you discuss any questions you have with your health care provider. Document Revised: 08/15/2017 Document Reviewed: 10/08/2016 Elsevier Patient Education  2020 [employer]  

## 2020-04-25 NOTE — Progress Notes (Signed)
There were no vitals taken for this visit.   Subjective:    Patient ID: Corey Perry, male    DOB: 1946/03/24, 74 y.o.   MRN: [id number]  HPI: Corey Perry is a 74 y.o. male presenting for pneumonia follow up.  Chief Complaint  Patient presents with  . Follow-up    Patient states he's feeling better.  . Diarrhea    Patient states he's still having diarrhea.    PNEUMONIA FOLLOW UP Patient's wife reports that he is improving quite a bit - he is feeling better and has more energy.  Wife reports she has been letting him use her nebulizer and it has helped with his cough a lot.  She reports he mowed the yard yesterday. Duration: diagnosed 04/21/2020 with pneumonia Onset: gradual Shortness of breath: no Cough: no Chest tightness: no Wheezing: no Fevers: no Chest pain: no Palpitations: no  Nausea: yes - if he eats too much Fatigue: yes, somewhat but much improved Diaphoresis: no Status: better  No Known Allergies  Outpatient Encounter Medications as of 04/25/2020  Medication Sig  . acetaminophen (TYLENOL) 500 MG tablet Take 500 mg by mouth every 6 (six) hours as needed for mild pain or moderate pain.  Marland Kitchen amoxicillin-clavulanate (AUGMENTIN) 875-125 MG tablet Take 1 tablet by mouth 2 (two) times daily for 5 days.  Marland Kitchen aspirin 81 MG tablet Take 81 mg by mouth daily.   Marland Kitchen atorvastatin (LIPITOR) 40 MG tablet Take 1 tablet (40 mg total) by mouth daily.  . clopidogrel (PLAVIX) 75 MG tablet Take 1 tablet (75 mg total) by mouth daily.  . diphenoxylate-atropine (LOMOTIL) 2.5-0.025 MG tablet Take 2 tablets by mouth 4 (four) times daily as needed for diarrhea or loose stools.  Marland Kitchen doxycycline (VIBRA-TABS) 100 MG tablet Take 1 tablet (100 mg total) by mouth 2 (two) times daily.  . isosorbide mononitrate (IMDUR) 30 MG 24 hr tablet Take 1 tablet (30 mg total) by mouth every evening.  Marland Kitchen levothyroxine (SYNTHROID) 50 MCG tablet Take 1 tablet (50 mcg total) by mouth daily.  Marland Kitchen lisinopril  (ZESTRIL) 2.5 MG tablet Take 1 tablet (2.5 mg total) by mouth every morning.  . loperamide (IMODIUM A-D) 2 MG tablet Take 2 mg by mouth 4 (four) times daily as needed for diarrhea or loose stools.  . metoprolol succinate (TOPROL-XL) 25 MG 24 hr tablet Take 1 tablet (25 mg total) by mouth daily.  . multivitamin-iron-minerals-folic acid (CENTRUM) chewable tablet Chew 1 tablet by mouth daily.  . nitroGLYCERIN (NITROSTAT) 0.4 MG SL tablet Place 1 tablet under the tongue every 5 (five) minutes as needed for chest pain.   Marland Kitchen ondansetron (ZOFRAN) 8 MG tablet Take 1 tablet (8 mg total) by mouth 2 (two) times daily as needed for refractory nausea / vomiting. Start on day 3 after chemotherapy.  . pantoprazole (PROTONIX) 20 MG tablet TAKE ONE TABLET BY MOUTH ONCE DAILY  . prochlorperazine (COMPAZINE) 10 MG tablet Take 1 tablet (10 mg total) by mouth every 6 (six) hours as needed (Nausea or vomiting).   No facility-administered encounter medications on file as of 04/25/2020.   Patient Active Problem List   Diagnosis Date Noted  . Pneumonia of right middle lobe due to infectious organism 04/25/2020  . Upper respiratory tract infection 04/20/2020  . Cough 04/20/2020  . Diarrhea 11/10/2019  . Atrial fibrillation ([place])   . Senile purpura ([place]) 08/27/2018  . Advanced care planning/counseling discussion 07/29/2017  . Recurrent inguinal hernia of right side without obstruction  or gangrene 07/29/2017  . Kidney stones 02/02/2017  . Esophageal cancer ([place]) 06/23/2016  . Leukopenia 04/10/2016  . Thrombocytopenia ([place]) 04/10/2016  . Hypertensive kidney disease with CKD stage III 06/22/2015  . CAD (coronary artery disease) 06/21/2015  . BPH (benign prostatic hyperplasia) 06/21/2015  . Acid reflux 06/21/2015  . Hypothyroid   . Hyperlipidemia    Past Medical History:  Diagnosis Date  . Abnormal white blood cell (WBC) count    seeing Dr. [name] at [facility] on 10/25  . Atrial fibrillation ([place])   . BPH  (benign prostatic hypertrophy)   . Bradycardia   . CAD (coronary artery disease)   . Dysrhythmia    bradycardia - sees Dr. Humphrey Rolls  . Esophageal cancer ([place]) 05/2016  . GERD (gastroesophageal reflux disease)   . History of kidney stones   . Hyperlipidemia   . Hypertension   . Hypothyroidism   . Kidney stone   . Myocardial infarction ([place]) 2005  . S/P angioplasty with stent 2005   x 4 vessels  . Sleep apnea    mild-NO CPAP  . Spinal stenosis    lumbar      Relevant past medical, surgical, family and social history reviewed and updated as indicated. Interim medical history since our last visit reviewed. Allergies and medications reviewed and updated.  Review of Systems  Constitutional: Negative.  Negative for activity change, appetite change, chills, diaphoresis, fatigue and fever.  HENT: Positive for congestion. Negative for ear pain, postnasal drip, rhinorrhea, sinus pressure, sinus pain, sneezing, sore throat and trouble swallowing.   Respiratory: Positive for cough. Negative for chest tightness, shortness of breath and wheezing.   Cardiovascular: Negative.  Negative for chest pain and palpitations.  Gastrointestinal: Positive for diarrhea and nausea. Negative for constipation and vomiting.  Neurological: Negative.  Negative for dizziness, light-headedness and headaches.  Psychiatric/Behavioral: Negative for confusion.    Per HPI unless specifically indicated above     Objective:    There were no vitals taken for this visit.  Wt Readings from Last 3 Encounters:  04/13/20 134 lb (60.8 kg)  03/29/20 130 lb 8 oz (59.2 kg)  03/08/20 134 lb 4.8 oz (60.9 kg)    Physical Exam Physical examination unable to be performed due to lack of equipment.  Results for orders placed or performed in visit on 03/29/20  Comprehensive metabolic panel  Result Value Ref Range   Sodium 139 135 - 145 mmol/L   Potassium 4.6 3.5 - 5.1 mmol/L   Chloride 107 98 - 111 mmol/L   CO2 24 22 - 32  mmol/L   Glucose, Bld 89 70 - 99 mg/dL   BUN 14 8 - 23 mg/dL   Creatinine, Ser 1.21 0.61 - 1.24 mg/dL   Calcium 8.3 (L) 8.9 - 10.3 mg/dL   Total Protein 7.2 6.5 - 8.1 g/dL   Albumin 2.8 (L) 3.5 - 5.0 g/dL   AST 27 15 - 41 U/L   ALT 21 0 - 44 U/L   Alkaline Phosphatase 83 38 - 126 U/L   Total Bilirubin 0.4 0.3 - 1.2 mg/dL   GFR calc non Af Amer 59 (L) >60 mL/min   GFR calc Af Amer >60 >60 mL/min   Anion gap 8 5 - 15  CBC with Differential  Result Value Ref Range   WBC 10.5 4.0 - 10.5 K/uL   RBC 2.60 (L) 4.22 - 5.81 MIL/uL   Hemoglobin 8.3 (L) 13.0 - 17.0 g/dL   HCT 27.4 (L) 39 -  52 %   MCV 105.4 (H) 80.0 - 100.0 fL   MCH 31.9 26.0 - 34.0 pg   MCHC 30.3 30.0 - 36.0 g/dL   RDW 19.9 (H) 11.5 - 15.5 %   Platelets 224 150 - 400 K/uL   nRBC 0.0 0.0 - 0.2 %   Neutrophils Relative % 81 %   Neutro Abs 8.7 (H) 1.7 - 7.7 K/uL   Lymphocytes Relative 7 %   Lymphs Abs 0.7 0.7 - 4.0 K/uL   Monocytes Relative 9 %   Monocytes Absolute 0.9 0 - 1 K/uL   Eosinophils Relative 1 %   Eosinophils Absolute 0.1 0 - 0 K/uL   Basophils Relative 1 %   Basophils Absolute 0.1 0 - 0 K/uL   Immature Granulocytes 1 %   Abs Immature Granulocytes 0.08 (H) 0.00 - 0.07 K/uL      Assessment & Plan:   Problem List Items Addressed This Visit      Respiratory   Pneumonia of right middle lobe due to infectious organism - Primary    Acute, improving symptomaticaly.  Diagnosed 8/6, taking amox-clav and doxy without adverse side effects.  Cough much improved with robitussin, can continue for now.  Encouraged to take daily probiotic to help prevent diarrhea.  Has scheduled appointment with PCP early next week, advised to keep that appointment.          Follow up plan: Return for as scheduled.

## 2020-04-27 ENCOUNTER — Ambulatory Visit
Admission: RE | Admit: 2020-04-27 | Discharge: 2020-04-27 | Disposition: A | Discharge status: Home / Self Care | Payer: Medicare Other | Source: Ambulatory Visit | Attending: Nurse Practitioner | Admitting: Nurse Practitioner

## 2020-04-27 ENCOUNTER — Other Ambulatory Visit: Payer: Self-pay | Admitting: Family Medicine

## 2020-04-27 ENCOUNTER — Other Ambulatory Visit: Payer: Self-pay

## 2020-04-27 DIAGNOSIS — I2510 Atherosclerotic heart disease of native coronary artery without angina pectoris: Secondary | ICD-10-CM | POA: Diagnosis not present

## 2020-04-27 DIAGNOSIS — C155 Malignant neoplasm of lower third of esophagus: Secondary | ICD-10-CM | POA: Diagnosis not present

## 2020-04-27 DIAGNOSIS — I700 Atherosclerosis of aorta: Secondary | ICD-10-CM | POA: Insufficient documentation

## 2020-04-27 DIAGNOSIS — N200 Calculus of kidney: Secondary | ICD-10-CM | POA: Insufficient documentation

## 2020-04-27 DIAGNOSIS — K5730 Diverticulosis of large intestine without perforation or abscess without bleeding: Secondary | ICD-10-CM | POA: Insufficient documentation

## 2020-04-27 DIAGNOSIS — Z9221 Personal history of antineoplastic chemotherapy: Secondary | ICD-10-CM | POA: Insufficient documentation

## 2020-04-27 DIAGNOSIS — C159 Malignant neoplasm of esophagus, unspecified: Secondary | ICD-10-CM | POA: Diagnosis not present

## 2020-04-27 DIAGNOSIS — J90 Pleural effusion, not elsewhere classified: Secondary | ICD-10-CM | POA: Insufficient documentation

## 2020-04-27 DIAGNOSIS — I10 Essential (primary) hypertension: Secondary | ICD-10-CM

## 2020-04-27 LAB — GLUCOSE, CAPILLARY: Glucose-Capillary: 73 mg/dL (ref 70–99)

## 2020-04-27 MED ORDER — FLUDEOXYGLUCOSE F - 18 (FDG) INJECTION
6.9000 | Freq: Once | INTRAVENOUS | Status: AC | PRN
  Administered 2020-04-27: 6.71 via INTRAVENOUS

## 2020-04-27 MED ORDER — LISINOPRIL 2.5 MG PO TABS: 2.5000 mg | ORAL_TABLET | ORAL | 1 refills | Status: DC

## 2020-04-27 NOTE — Telephone Encounter (Signed)
Pt request refill  lisinopril (ZESTRIL) 2.5 MG tablet  [facility] [place] Phone:  [phone]  Fax:  [phone]

## 2020-04-28 ENCOUNTER — Encounter: Payer: Self-pay | Admitting: Oncology

## 2020-04-28 ENCOUNTER — Inpatient Hospital Stay: Payer: Medicare Other | Attending: Oncology | Admitting: Oncology

## 2020-04-28 VITALS — BP 104/74 | HR 68 | Temp 97.7°F | Resp 18 | Ht 68.0 in | Wt 126.1 lb

## 2020-04-28 DIAGNOSIS — D649 Anemia, unspecified: Secondary | ICD-10-CM | POA: Diagnosis not present

## 2020-04-28 DIAGNOSIS — C155 Malignant neoplasm of lower third of esophagus: Secondary | ICD-10-CM | POA: Insufficient documentation

## 2020-04-28 DIAGNOSIS — R531 Weakness: Secondary | ICD-10-CM | POA: Diagnosis not present

## 2020-04-28 DIAGNOSIS — R197 Diarrhea, unspecified: Secondary | ICD-10-CM | POA: Diagnosis not present

## 2020-04-28 DIAGNOSIS — Z79899 Other long term (current) drug therapy: Secondary | ICD-10-CM | POA: Diagnosis not present

## 2020-04-28 DIAGNOSIS — N183 Chronic kidney disease, stage 3 unspecified: Secondary | ICD-10-CM | POA: Diagnosis not present

## 2020-04-28 DIAGNOSIS — R05 Cough: Secondary | ICD-10-CM | POA: Insufficient documentation

## 2020-04-28 DIAGNOSIS — I2510 Atherosclerotic heart disease of native coronary artery without angina pectoris: Secondary | ICD-10-CM | POA: Insufficient documentation

## 2020-04-28 DIAGNOSIS — Z833 Family history of diabetes mellitus: Secondary | ICD-10-CM | POA: Insufficient documentation

## 2020-04-28 DIAGNOSIS — I77811 Abdominal aortic ectasia: Secondary | ICD-10-CM | POA: Diagnosis not present

## 2020-04-28 DIAGNOSIS — K5730 Diverticulosis of large intestine without perforation or abscess without bleeding: Secondary | ICD-10-CM | POA: Insufficient documentation

## 2020-04-28 DIAGNOSIS — Z8249 Family history of ischemic heart disease and other diseases of the circulatory system: Secondary | ICD-10-CM | POA: Insufficient documentation

## 2020-04-28 DIAGNOSIS — K219 Gastro-esophageal reflux disease without esophagitis: Secondary | ICD-10-CM | POA: Diagnosis not present

## 2020-04-28 DIAGNOSIS — I252 Old myocardial infarction: Secondary | ICD-10-CM | POA: Insufficient documentation

## 2020-04-28 DIAGNOSIS — N400 Enlarged prostate without lower urinary tract symptoms: Secondary | ICD-10-CM | POA: Diagnosis not present

## 2020-04-28 DIAGNOSIS — N200 Calculus of kidney: Secondary | ICD-10-CM | POA: Insufficient documentation

## 2020-04-28 DIAGNOSIS — Z8349 Family history of other endocrine, nutritional and metabolic diseases: Secondary | ICD-10-CM | POA: Insufficient documentation

## 2020-04-28 DIAGNOSIS — E039 Hypothyroidism, unspecified: Secondary | ICD-10-CM | POA: Diagnosis not present

## 2020-04-28 DIAGNOSIS — Z818 Family history of other mental and behavioral disorders: Secondary | ICD-10-CM | POA: Insufficient documentation

## 2020-04-28 DIAGNOSIS — I2583 Coronary atherosclerosis due to lipid rich plaque: Secondary | ICD-10-CM | POA: Diagnosis not present

## 2020-04-28 DIAGNOSIS — Z9221 Personal history of antineoplastic chemotherapy: Secondary | ICD-10-CM | POA: Insufficient documentation

## 2020-04-28 DIAGNOSIS — Z841 Family history of disorders of kidney and ureter: Secondary | ICD-10-CM | POA: Insufficient documentation

## 2020-04-28 DIAGNOSIS — E785 Hyperlipidemia, unspecified: Secondary | ICD-10-CM | POA: Diagnosis not present

## 2020-04-28 DIAGNOSIS — Z87891 Personal history of nicotine dependence: Secondary | ICD-10-CM | POA: Insufficient documentation

## 2020-04-28 DIAGNOSIS — I700 Atherosclerosis of aorta: Secondary | ICD-10-CM | POA: Insufficient documentation

## 2020-04-28 DIAGNOSIS — J90 Pleural effusion, not elsewhere classified: Secondary | ICD-10-CM | POA: Diagnosis not present

## 2020-04-28 DIAGNOSIS — Z9049 Acquired absence of other specified parts of digestive tract: Secondary | ICD-10-CM | POA: Diagnosis not present

## 2020-04-28 DIAGNOSIS — I4891 Unspecified atrial fibrillation: Secondary | ICD-10-CM | POA: Diagnosis not present

## 2020-04-28 NOTE — Progress Notes (Signed)
No new changes noted today 

## 2020-05-01 ENCOUNTER — Ambulatory Visit: Payer: Medicare Other | Admitting: Family Medicine

## 2020-05-01 ENCOUNTER — Other Ambulatory Visit: Payer: Self-pay

## 2020-05-01 ENCOUNTER — Encounter: Payer: Self-pay | Admitting: Family Medicine

## 2020-05-01 ENCOUNTER — Ambulatory Visit (INDEPENDENT_AMBULATORY_CARE_PROVIDER_SITE_OTHER): Payer: Medicare Other | Admitting: Family Medicine

## 2020-05-01 VITALS — BP 97/60 | HR 67 | Temp 97.6°F | Wt 125.8 lb

## 2020-05-01 DIAGNOSIS — E785 Hyperlipidemia, unspecified: Secondary | ICD-10-CM | POA: Diagnosis not present

## 2020-05-01 DIAGNOSIS — I129 Hypertensive chronic kidney disease with stage 1 through stage 4 chronic kidney disease, or unspecified chronic kidney disease: Secondary | ICD-10-CM | POA: Diagnosis not present

## 2020-05-01 DIAGNOSIS — I2510 Atherosclerotic heart disease of native coronary artery without angina pectoris: Secondary | ICD-10-CM

## 2020-05-01 DIAGNOSIS — N400 Enlarged prostate without lower urinary tract symptoms: Secondary | ICD-10-CM | POA: Diagnosis not present

## 2020-05-01 DIAGNOSIS — N183 Chronic kidney disease, stage 3 unspecified: Secondary | ICD-10-CM

## 2020-05-01 DIAGNOSIS — I10 Essential (primary) hypertension: Secondary | ICD-10-CM | POA: Diagnosis not present

## 2020-05-01 DIAGNOSIS — I4891 Unspecified atrial fibrillation: Secondary | ICD-10-CM

## 2020-05-01 DIAGNOSIS — E039 Hypothyroidism, unspecified: Secondary | ICD-10-CM | POA: Diagnosis not present

## 2020-05-01 DIAGNOSIS — C155 Malignant neoplasm of lower third of esophagus: Secondary | ICD-10-CM | POA: Diagnosis not present

## 2020-05-01 DIAGNOSIS — I2583 Coronary atherosclerosis due to lipid rich plaque: Secondary | ICD-10-CM | POA: Diagnosis not present

## 2020-05-01 DIAGNOSIS — K219 Gastro-esophageal reflux disease without esophagitis: Secondary | ICD-10-CM

## 2020-05-01 MED ORDER — METOPROLOL SUCCINATE ER 25 MG PO TB24: 25.0000 mg | ORAL_TABLET | Freq: Every day | ORAL | 1 refills | Status: DC

## 2020-05-01 MED ORDER — ATORVASTATIN CALCIUM 40 MG PO TABS: 40.0000 mg | ORAL_TABLET | Freq: Every day | ORAL | 1 refills | Status: AC

## 2020-05-01 MED ORDER — CLOPIDOGREL BISULFATE 75 MG PO TABS: 75.0000 mg | ORAL_TABLET | Freq: Every day | ORAL | 3 refills | Status: AC

## 2020-05-01 MED ORDER — LISINOPRIL 2.5 MG PO TABS: 2.5000 mg | ORAL_TABLET | ORAL | 1 refills | Status: DC

## 2020-05-01 NOTE — Assessment & Plan Note (Signed)
BP running quite low. Stop isosorbide. Continue lisinopril and metoprolol. Recheck 1 month.

## 2020-05-01 NOTE — Assessment & Plan Note (Signed)
Under good control on current regimen. Continue current regimen. Continue to monitor. Call with any concerns. Refills given. Labs drawn today.   

## 2020-05-01 NOTE — Assessment & Plan Note (Signed)
Continue plavix. Keep BP and cholesterol under good control. Call with any concerns.

## 2020-05-01 NOTE — Progress Notes (Signed)
BP 97/60 (BP Location: Left Arm, Patient Position: Sitting, Cuff Size: Normal)   Pulse 67   Temp 97.6 F (36.4 C) (Oral)   Wt 125 lb 12.8 oz (57.1 kg)   SpO2 100%   BMI 19.13 kg/m    Subjective:    Patient ID: Corey Perry, male    DOB: December 26, 1945, 74 y.o.   MRN: [id number]  HPI: Corey Perry is a 74 y.o. male  Chief Complaint  Patient presents with  . Hypertension  . Hyperlipidemia  . Hypothyroidism   HYPERTENSION / HYPERLIPIDEMIA Satisfied with current treatment? yes Duration of hypertension: chronic BP monitoring frequency: not checking BP medication side effects: no Duration of hyperlipidemia: chronic Cholesterol medication side effects: no Cholesterol supplements: none Past cholesterol medications: atorvastatin Medication compliance: excellent compliance Aspirin: no Recent stressors: yes Recurrent headaches: no Visual changes: no Palpitations: no Dyspnea: no Chest pain: no Lower extremity edema: no Dizzy/lightheaded: yes  HYPOTHYROIDISM Thyroid control status:stable Satisfied with current treatment? yes Medication side effects: yes Medication compliance: excellent compliance Recent dose adjustment:no Fatigue: yes Cold intolerance: no Heat intolerance: no Weight gain: no Weight loss: yes Constipation: no Diarrhea/loose stools: yes Palpitations: no Lower extremity edema: no Anxiety/depressed mood: no   Relevant past medical, surgical, family and social history reviewed and updated as indicated. Interim medical history since our last visit reviewed. Allergies and medications reviewed and updated.  Review of Systems  Constitutional: Negative.   Respiratory: Negative.   Cardiovascular: Negative.   Gastrointestinal: Negative.   Musculoskeletal: Negative.   Skin: Negative.   Psychiatric/Behavioral: Negative.     Per HPI unless specifically indicated above     Objective:    BP 97/60 (BP Location: Left Arm, Patient Position:  Sitting, Cuff Size: Normal)   Pulse 67   Temp 97.6 F (36.4 C) (Oral)   Wt 125 lb 12.8 oz (57.1 kg)   SpO2 100%   BMI 19.13 kg/m   Wt Readings from Last 3 Encounters:  05/01/20 125 lb 12.8 oz (57.1 kg)  04/28/20 126 lb 1.6 oz (57.2 kg)  04/27/20 127 lb (57.6 kg)    Physical Exam Vitals and nursing note reviewed.  Constitutional:      General: He is not in acute distress.    Appearance: Normal appearance. He is not ill-appearing, toxic-appearing or diaphoretic.  HENT:     Head: Normocephalic and atraumatic.     Right Ear: External ear normal.     Left Ear: External ear normal.     Nose: Nose normal.     Mouth/Throat:     Mouth: Mucous membranes are moist.     Pharynx: Oropharynx is clear.  Eyes:     General: No scleral icterus.       Right eye: No discharge.        Left eye: No discharge.     Extraocular Movements: Extraocular movements intact.     Conjunctiva/sclera: Conjunctivae normal.     Pupils: Pupils are equal, round, and reactive to light.  Cardiovascular:     Rate and Rhythm: Normal rate and regular rhythm.     Pulses: Normal pulses.     Heart sounds: Normal heart sounds. No murmur heard.  No friction rub. No gallop.   Pulmonary:     Effort: Pulmonary effort is normal. No respiratory distress.     Breath sounds: No stridor. Wheezing (R lung) present. No rhonchi or rales.  Chest:     Chest wall: No tenderness.  Musculoskeletal:  General: Normal range of motion.     Cervical back: Normal range of motion and neck supple.  Skin:    General: Skin is warm and dry.     Capillary Refill: Capillary refill takes less than 2 seconds.     Coloration: Skin is not jaundiced or pale.     Findings: No bruising, erythema, lesion or rash.  Neurological:     General: No focal deficit present.     Mental Status: He is alert and oriented to person, place, and time. Mental status is at baseline.  Psychiatric:        Mood and Affect: Mood normal.        Behavior:  Behavior normal.        Thought Content: Thought content normal.        Judgment: Judgment normal.     Results for orders placed or performed during the hospital encounter of 04/27/20  Glucose, capillary  Result Value Ref Range   Glucose-Capillary 73 70 - 99 mg/dL      Assessment & Plan:   Problem List Items Addressed This Visit      Cardiovascular and Mediastinum   CAD (coronary artery disease)    Continue plavix. Keep BP and cholesterol under good control. Call with any concerns.       Relevant Medications   lisinopril (ZESTRIL) 2.5 MG tablet   metoprolol succinate (TOPROL-XL) 25 MG 24 hr tablet   atorvastatin (LIPITOR) 40 MG tablet   Other Relevant Orders   CBC with Differential/Platelet   Lipid Panel w/o Chol/HDL Ratio   Atrial fibrillation (HCC)    In NSR today. Continue metoprolol. Call with any concerns.       Relevant Medications   lisinopril (ZESTRIL) 2.5 MG tablet   metoprolol succinate (TOPROL-XL) 25 MG 24 hr tablet   atorvastatin (LIPITOR) 40 MG tablet   Other Relevant Orders   CBC with Differential/Platelet     Digestive   Acid reflux    Under good control on current regimen. Continue current regimen. Continue to monitor. Call with any concerns. Refills given. Labs drawn today.       Esophageal cancer (HCC)   Relevant Medications   clopidogrel (PLAVIX) 75 MG tablet     Endocrine   Hypothyroid - Primary    Rechecking labs today. Await results. Treat as needed.       Relevant Medications   metoprolol succinate (TOPROL-XL) 25 MG 24 hr tablet   Other Relevant Orders   CBC with Differential/Platelet   TSH     Genitourinary   BPH (benign prostatic hyperplasia)    Under good control on current regimen. Continue current regimen. Continue to monitor. Call with any concerns. Refills given.       Relevant Orders   CBC with Differential/Platelet   Hypertensive kidney disease with CKD stage III    BP running quite low. Stop isosorbide. Continue  lisinopril and metoprolol. Recheck 1 month.       Relevant Orders   CBC with Differential/Platelet   Comprehensive metabolic panel     Other   Hyperlipidemia    Under good control on current regimen. Continue current regimen. Continue to monitor. Call with any concerns. Refills given. Labs drawn today.       Relevant Medications   lisinopril (ZESTRIL) 2.5 MG tablet   metoprolol succinate (TOPROL-XL) 25 MG 24 hr tablet   atorvastatin (LIPITOR) 40 MG tablet   Other Relevant Orders   CBC with Differential/Platelet   Comprehensive  metabolic panel   Lipid Panel w/o Chol/HDL Ratio    Other Visit Diagnoses    Essential hypertension       Relevant Medications   lisinopril (ZESTRIL) 2.5 MG tablet   metoprolol succinate (TOPROL-XL) 25 MG 24 hr tablet   atorvastatin (LIPITOR) 40 MG tablet       Follow up plan: Return in about 4 weeks (around 05/29/2020) for Bp follow up.

## 2020-05-01 NOTE — Assessment & Plan Note (Signed)
In NSR today. Continue metoprolol. Call with any concerns.

## 2020-05-01 NOTE — Assessment & Plan Note (Signed)
Under good control on current regimen. Continue current regimen. Continue to monitor. Call with any concerns. Refills given.   

## 2020-05-01 NOTE — Assessment & Plan Note (Signed)
Rechecking labs today. Await results. Treat as needed.  °

## 2020-05-02 ENCOUNTER — Ambulatory Visit: Payer: Medicare Other | Admitting: Family Medicine

## 2020-05-02 LAB — CBC WITH DIFFERENTIAL/PLATELET
Basophils Absolute: 0 10*3/uL (ref 0.0–0.2)
Basos: 1 %
EOS (ABSOLUTE): 0.1 10*3/uL (ref 0.0–0.4)
Eos: 1 %
Hematocrit: 28.1 % — ABNORMAL LOW (ref 37.5–51.0)
Hemoglobin: 8.4 g/dL — ABNORMAL LOW (ref 13.0–17.7)
Immature Grans (Abs): 0 10*3/uL (ref 0.0–0.1)
Immature Granulocytes: 0 %
Lymphocytes Absolute: 0.7 10*3/uL (ref 0.7–3.1)
Lymphs: 8 %
MCH: 30.8 pg (ref 26.6–33.0)
MCHC: 29.9 g/dL — ABNORMAL LOW (ref 31.5–35.7)
MCV: 103 fL — ABNORMAL HIGH (ref 79–97)
Monocytes Absolute: 0.7 10*3/uL (ref 0.1–0.9)
Monocytes: 9 %
Neutrophils Absolute: 6.5 10*3/uL (ref 1.4–7.0)
Neutrophils: 81 %
Platelets: 203 10*3/uL (ref 150–450)
RBC: 2.73 x10E6/uL — CL (ref 4.14–5.80)
RDW: 17.2 % — ABNORMAL HIGH (ref 11.6–15.4)
WBC: 8.1 10*3/uL (ref 3.4–10.8)

## 2020-05-02 LAB — COMPREHENSIVE METABOLIC PANEL
ALT: 55 IU/L — ABNORMAL HIGH (ref 0–44)
AST: 92 IU/L — ABNORMAL HIGH (ref 0–40)
Albumin/Globulin Ratio: 0.8 — ABNORMAL LOW (ref 1.2–2.2)
Albumin: 3.4 g/dL — ABNORMAL LOW (ref 3.7–4.7)
Alkaline Phosphatase: 80 IU/L (ref 48–121)
BUN/Creatinine Ratio: 16 (ref 10–24)
BUN: 21 mg/dL (ref 8–27)
Bilirubin Total: 0.4 mg/dL (ref 0.0–1.2)
CO2: 22 mmol/L (ref 20–29)
Calcium: 8.8 mg/dL (ref 8.6–10.2)
Chloride: 101 mmol/L (ref 96–106)
Creatinine, Ser: 1.28 mg/dL — ABNORMAL HIGH (ref 0.76–1.27)
GFR calc Af Amer: 64 mL/min/{1.73_m2} (ref >59)
GFR calc non Af Amer: 55 mL/min/{1.73_m2} — ABNORMAL LOW (ref >59)
Globulin, Total: 4.5 g/dL (ref 1.5–4.5)
Glucose: 80 mg/dL (ref 65–99)
Potassium: 4.4 mmol/L (ref 3.5–5.2)
Sodium: 139 mmol/L (ref 134–144)
Total Protein: 7.9 g/dL (ref 6.0–8.5)

## 2020-05-02 LAB — LIPID PANEL W/O CHOL/HDL RATIO
Cholesterol, Total: 72 mg/dL — ABNORMAL LOW (ref 100–199)
HDL: 42 mg/dL (ref >39)
LDL Chol Calc (NIH): 15 mg/dL (ref 0–99)
Triglycerides: 64 mg/dL (ref 0–149)
VLDL Cholesterol Cal: 15 mg/dL (ref 5–40)

## 2020-05-02 LAB — TSH: TSH: 4.75 u[IU]/mL — ABNORMAL HIGH (ref 0.450–4.500)

## 2020-05-21 ENCOUNTER — Emergency Department: Payer: Medicare Other

## 2020-05-21 ENCOUNTER — Other Ambulatory Visit: Payer: Self-pay

## 2020-05-21 ENCOUNTER — Inpatient Hospital Stay
Admission: EM | Admit: 2020-05-21 | Discharge: 2020-05-24 | DRG: 871 | Disposition: A | Discharge status: Home / Self Care | Payer: Medicare Other | Attending: Internal Medicine | Admitting: Internal Medicine

## 2020-05-21 ENCOUNTER — Encounter: Payer: Self-pay | Admitting: Radiology

## 2020-05-21 DIAGNOSIS — Z79890 Hormone replacement therapy (postmenopausal): Secondary | ICD-10-CM

## 2020-05-21 DIAGNOSIS — E039 Hypothyroidism, unspecified: Secondary | ICD-10-CM | POA: Diagnosis present

## 2020-05-21 DIAGNOSIS — G4733 Obstructive sleep apnea (adult) (pediatric): Secondary | ICD-10-CM | POA: Diagnosis present

## 2020-05-21 DIAGNOSIS — J90 Pleural effusion, not elsewhere classified: Secondary | ICD-10-CM | POA: Diagnosis not present

## 2020-05-21 DIAGNOSIS — E785 Hyperlipidemia, unspecified: Secondary | ICD-10-CM | POA: Diagnosis present

## 2020-05-21 DIAGNOSIS — Z681 Body mass index (BMI) 19 or less, adult: Secondary | ICD-10-CM

## 2020-05-21 DIAGNOSIS — E871 Hypo-osmolality and hyponatremia: Secondary | ICD-10-CM | POA: Diagnosis not present

## 2020-05-21 DIAGNOSIS — Z87442 Personal history of urinary calculi: Secondary | ICD-10-CM

## 2020-05-21 DIAGNOSIS — C159 Malignant neoplasm of esophagus, unspecified: Secondary | ICD-10-CM | POA: Diagnosis present

## 2020-05-21 DIAGNOSIS — Z8501 Personal history of malignant neoplasm of esophagus: Secondary | ICD-10-CM

## 2020-05-21 DIAGNOSIS — R6521 Severe sepsis with septic shock: Secondary | ICD-10-CM | POA: Diagnosis not present

## 2020-05-21 DIAGNOSIS — N179 Acute kidney failure, unspecified: Secondary | ICD-10-CM | POA: Diagnosis not present

## 2020-05-21 DIAGNOSIS — I129 Hypertensive chronic kidney disease with stage 1 through stage 4 chronic kidney disease, or unspecified chronic kidney disease: Secondary | ICD-10-CM | POA: Diagnosis present

## 2020-05-21 DIAGNOSIS — Z841 Family history of disorders of kidney and ureter: Secondary | ICD-10-CM

## 2020-05-21 DIAGNOSIS — J189 Pneumonia, unspecified organism: Secondary | ICD-10-CM | POA: Diagnosis present

## 2020-05-21 DIAGNOSIS — Z20822 Contact with and (suspected) exposure to covid-19: Secondary | ICD-10-CM | POA: Diagnosis not present

## 2020-05-21 DIAGNOSIS — N183 Chronic kidney disease, stage 3 unspecified: Secondary | ICD-10-CM

## 2020-05-21 DIAGNOSIS — I480 Paroxysmal atrial fibrillation: Secondary | ICD-10-CM | POA: Diagnosis present

## 2020-05-21 DIAGNOSIS — I2510 Atherosclerotic heart disease of native coronary artery without angina pectoris: Secondary | ICD-10-CM | POA: Diagnosis present

## 2020-05-21 DIAGNOSIS — Z87891 Personal history of nicotine dependence: Secondary | ICD-10-CM

## 2020-05-21 DIAGNOSIS — R531 Weakness: Secondary | ICD-10-CM | POA: Diagnosis not present

## 2020-05-21 DIAGNOSIS — A419 Sepsis, unspecified organism: Secondary | ICD-10-CM | POA: Diagnosis not present

## 2020-05-21 DIAGNOSIS — Z833 Family history of diabetes mellitus: Secondary | ICD-10-CM

## 2020-05-21 DIAGNOSIS — Z7982 Long term (current) use of aspirin: Secondary | ICD-10-CM

## 2020-05-21 DIAGNOSIS — R079 Chest pain, unspecified: Secondary | ICD-10-CM | POA: Diagnosis not present

## 2020-05-21 DIAGNOSIS — D6481 Anemia due to antineoplastic chemotherapy: Secondary | ICD-10-CM | POA: Diagnosis present

## 2020-05-21 DIAGNOSIS — N400 Enlarged prostate without lower urinary tract symptoms: Secondary | ICD-10-CM | POA: Diagnosis present

## 2020-05-21 DIAGNOSIS — E440 Moderate protein-calorie malnutrition: Secondary | ICD-10-CM | POA: Diagnosis present

## 2020-05-21 DIAGNOSIS — R509 Fever, unspecified: Secondary | ICD-10-CM | POA: Diagnosis not present

## 2020-05-21 DIAGNOSIS — D696 Thrombocytopenia, unspecified: Secondary | ICD-10-CM | POA: Diagnosis present

## 2020-05-21 DIAGNOSIS — J690 Pneumonitis due to inhalation of food and vomit: Secondary | ICD-10-CM

## 2020-05-21 DIAGNOSIS — Z9861 Coronary angioplasty status: Secondary | ICD-10-CM

## 2020-05-21 DIAGNOSIS — Z79899 Other long term (current) drug therapy: Secondary | ICD-10-CM

## 2020-05-21 DIAGNOSIS — N1831 Chronic kidney disease, stage 3a: Secondary | ICD-10-CM | POA: Diagnosis present

## 2020-05-21 DIAGNOSIS — I959 Hypotension, unspecified: Secondary | ICD-10-CM

## 2020-05-21 DIAGNOSIS — R05 Cough: Secondary | ICD-10-CM | POA: Diagnosis not present

## 2020-05-21 DIAGNOSIS — T451X5A Adverse effect of antineoplastic and immunosuppressive drugs, initial encounter: Secondary | ICD-10-CM | POA: Diagnosis present

## 2020-05-21 DIAGNOSIS — J9601 Acute respiratory failure with hypoxia: Secondary | ICD-10-CM | POA: Diagnosis not present

## 2020-05-21 DIAGNOSIS — I252 Old myocardial infarction: Secondary | ICD-10-CM

## 2020-05-21 DIAGNOSIS — R031 Nonspecific low blood-pressure reading: Secondary | ICD-10-CM | POA: Diagnosis not present

## 2020-05-21 DIAGNOSIS — Z8249 Family history of ischemic heart disease and other diseases of the circulatory system: Secondary | ICD-10-CM

## 2020-05-21 DIAGNOSIS — Z8701 Personal history of pneumonia (recurrent): Secondary | ICD-10-CM

## 2020-05-21 DIAGNOSIS — J9621 Acute and chronic respiratory failure with hypoxia: Secondary | ICD-10-CM

## 2020-05-21 HISTORY — DX: Anemia, unspecified: D64.9

## 2020-05-21 LAB — CBC WITH DIFFERENTIAL/PLATELET
Abs Immature Granulocytes: 0.03 10*3/uL (ref 0.00–0.07)
Basophils Absolute: 0 10*3/uL (ref 0.0–0.1)
Basophils Relative: 0 %
Eosinophils Absolute: 0 10*3/uL (ref 0.0–0.5)
Eosinophils Relative: 0 %
HCT: 29.3 % — ABNORMAL LOW (ref 39.0–52.0)
Hemoglobin: 9.2 g/dL — ABNORMAL LOW (ref 13.0–17.0)
Immature Granulocytes: 0 %
Lymphocytes Relative: 7 %
Lymphs Abs: 0.7 10*3/uL (ref 0.7–4.0)
MCH: 30.9 pg (ref 26.0–34.0)
MCHC: 31.4 g/dL (ref 30.0–36.0)
MCV: 98.3 fL (ref 80.0–100.0)
Monocytes Absolute: 0.5 10*3/uL (ref 0.1–1.0)
Monocytes Relative: 5 %
Neutro Abs: 8.2 10*3/uL — ABNORMAL HIGH (ref 1.7–7.7)
Neutrophils Relative %: 88 %
Platelets: 195 10*3/uL (ref 150–400)
RBC: 2.98 MIL/uL — ABNORMAL LOW (ref 4.22–5.81)
RDW: 19 % — ABNORMAL HIGH (ref 11.5–15.5)
WBC Morphology: INCREASED
WBC: 9.5 10*3/uL (ref 4.0–10.5)
nRBC: 0 % (ref 0.0–0.2)

## 2020-05-21 LAB — COMPREHENSIVE METABOLIC PANEL
ALT: 30 U/L (ref 0–44)
AST: 31 U/L (ref 15–41)
Albumin: 2.9 g/dL — ABNORMAL LOW (ref 3.5–5.0)
Alkaline Phosphatase: 59 U/L (ref 38–126)
Anion gap: 7 (ref 5–15)
BUN: 24 mg/dL — ABNORMAL HIGH (ref 8–23)
CO2: 29 mmol/L (ref 22–32)
Calcium: 8.7 mg/dL — ABNORMAL LOW (ref 8.9–10.3)
Chloride: 100 mmol/L (ref 98–111)
Creatinine, Ser: 1.55 mg/dL — ABNORMAL HIGH (ref 0.61–1.24)
GFR calc Af Amer: 51 mL/min — ABNORMAL LOW (ref >60)
GFR calc non Af Amer: 44 mL/min — ABNORMAL LOW (ref >60)
Glucose, Bld: 111 mg/dL — ABNORMAL HIGH (ref 70–99)
Potassium: 5.1 mmol/L (ref 3.5–5.1)
Sodium: 136 mmol/L (ref 135–145)
Total Bilirubin: 0.7 mg/dL (ref 0.3–1.2)
Total Protein: 8.2 g/dL — ABNORMAL HIGH (ref 6.5–8.1)

## 2020-05-21 LAB — PROTIME-INR
INR: 1.2 (ref 0.8–1.2)
Prothrombin Time: 15.1 seconds (ref 11.4–15.2)

## 2020-05-21 LAB — PROCALCITONIN: Procalcitonin: 0.27 ng/mL

## 2020-05-21 LAB — LACTIC ACID, PLASMA: Lactic Acid, Venous: 1.7 mmol/L (ref 0.5–1.9)

## 2020-05-21 MED ORDER — IOHEXOL 350 MG/ML SOLN
75.0000 mL | Freq: Once | INTRAVENOUS | Status: AC | PRN
  Administered 2020-05-21: 75 mL via INTRAVENOUS

## 2020-05-21 MED ORDER — LACTATED RINGERS IV BOLUS
1000.0000 mL | Freq: Once | INTRAVENOUS | Status: AC
  Administered 2020-05-22: 1000 mL via INTRAVENOUS

## 2020-05-21 MED ORDER — SODIUM CHLORIDE 0.9 % IV SOLN
1.0000 g | Freq: Once | INTRAVENOUS | Status: AC
  Administered 2020-05-21: 1 g via INTRAVENOUS
  Filled 2020-05-21: qty 10

## 2020-05-21 MED ORDER — LACTATED RINGERS IV BOLUS
1000.0000 mL | Freq: Once | INTRAVENOUS | Status: AC
  Administered 2020-05-21: 1000 mL via INTRAVENOUS

## 2020-05-21 MED ORDER — AZITHROMYCIN 500 MG PO TABS
500.0000 mg | ORAL_TABLET | Freq: Once | ORAL | Status: AC
  Administered 2020-05-21: 500 mg via ORAL
  Filled 2020-05-21: qty 1

## 2020-05-21 NOTE — ED Provider Notes (Signed)
[facility] Emergency Department Provider Note  ____________________________________________  Time seen: Approximately 11:38 PM  I have reviewed the triage vital signs and the nursing notes.   HISTORY  Chief Complaint Cough   HPI Corey Perry is a 74 y.o. male with a history of esophageal cancer status post chemo, A. fib, CAD on Plavix, hypertension, hyperlipidemia, kidney stones who presents for evaluation of cough.  Patient was diagnosed with pneumonia 3 weeks ago by his primary care doctor.  It was recommended that he be tested for Covid however patient never underwent testing.  He is not vaccinated.  Patient has been on a course of Augmentin and doxycycline with no significant improvement.  Has had severely diminished appetite, decreased oral intake, generalized weakness.  Had a fever of 101F at home for which she took Tylenol before coming into the hospital.  Is also complaining of intermittent right sided sharp chest pain which is mild but present.  He denies shortness of breath.  The cough is productive of green sputum.  He denies vomiting or diarrhea.   Past Medical History:  Diagnosis Date   Abnormal white blood cell (WBC) count    seeing Dr. [name] at [facility] on 10/25   Atrial fibrillation [facility])    BPH (benign prostatic hypertrophy)    Bradycardia    CAD (coronary artery disease)    Dysrhythmia    bradycardia - sees Dr. Humphrey Rolls   Esophageal cancer [facility]) 05/2016   GERD (gastroesophageal reflux disease)    History of kidney stones    Hyperlipidemia    Hypertension    Hypothyroidism    Kidney stone    Myocardial infarction ([place]) 2005   S/P angioplasty with stent 2005   x 4 vessels   Sleep apnea    mild-NO CPAP   Spinal stenosis    lumbar    Patient Active Problem List   Diagnosis Date Noted   Pneumonia of right middle lobe due to infectious organism 04/25/2020   Cough 04/20/2020   Diarrhea 11/10/2019   Atrial  fibrillation ([place])    Senile purpura ([place]) 08/27/2018   Advanced care planning/counseling discussion 07/29/2017   Recurrent inguinal hernia of right side without obstruction or gangrene 07/29/2017   Kidney stones 02/02/2017   Esophageal cancer ([place]) 06/23/2016   Leukopenia 04/10/2016   Thrombocytopenia ([place]) 04/10/2016   Hypertensive kidney disease with CKD stage III 06/22/2015   CAD (coronary artery disease) 06/21/2015   BPH (benign prostatic hyperplasia) 06/21/2015   Acid reflux 06/21/2015   Hypothyroid    Hyperlipidemia     Past Surgical History:  Procedure Laterality Date   BACK SURGERY     CARDIAC CATHETERIZATION Left 03/12/2016   Procedure: Left Heart Cath and Coronary Angiography;  Surgeon: [name], MD;  Location: [place] [facility];  Service: Cardiovascular;  Laterality: Left;   CARDIAC CATHETERIZATION N/A 03/12/2016   Procedure: Coronary Stent Intervention;  Surgeon: Yolonda Kida, MD;  Location: [place] [facility];  Service: Cardiovascular;  Laterality: N/A;   COLONOSCOPY WITH PROPOFOL N/A 07/13/2015   Procedure: COLONOSCOPY WITH PROPOFOL;  Surgeon: Lucilla Lame, MD;  Location: [place];  Service: Endoscopy;  Laterality: N/A;   CORONARY ANGIOPLASTY  10/05 and 12/05   4 DES placed   CYSTOSCOPY W/ RETROGRADES Left 06/15/2019   Procedure: CYSTOSCOPY WITH RETROGRADE PYELOGRAM;  Surgeon: Abbie Sons, MD;  Location: [facility];  Service: Urology;  Laterality: Left;   CYSTOSCOPY/URETEROSCOPY/HOLMIUM LASER/STENT PLACEMENT Left 06/15/2019  Procedure: CYSTOSCOPY/URETEROSCOPY/HOLMIUM LASER/STENT PLACEMENT;  Surgeon: Abbie Sons, MD;  Location: [facility];  Service: Urology;  Laterality: Left;   ESOPHAGECTOMY  10/07/2016   @ [facility]   ESOPHAGOGASTRODUODENOSCOPY (EGD) WITH PROPOFOL N/A 06/14/2016   Procedure: ESOPHAGOGASTRODUODENOSCOPY (EGD) WITH PROPOFOL;  Surgeon: [name], MD;  Location: [facility] ENDOSCOPY;  Service: Endoscopy;   Laterality: N/A;   HERNIA REPAIR Bilateral    x3   INGUINAL HERNIA REPAIR Right 08/28/2017   Large PerFix plug, recurrent hernia;  Surgeon: Robert Bellow, MD;  Location: [facility];  Service: General;  Laterality: Right;  recurrent   KIDNEY STONE SURGERY  07/15/2019   POLYPECTOMY  07/13/2015   Procedure: POLYPECTOMY;  Surgeon: Lucilla Lame, MD;  Location: [place];  Service: Endoscopy;;   PORT-A-CATH REMOVAL N/A 01/16/2017   Procedure: REMOVAL PORT-A-CATH;  Surgeon: Olean Ree, MD;  Location: [facility];  Service: General;  Laterality: N/A;   PORTACATH PLACEMENT N/A 07/08/2016   Procedure: INSERTION PORT-A-CATH;  Surgeon: Olean Ree, MD;  Location: [facility];  Service: General;  Laterality: N/A;   PORTACATH PLACEMENT Right 08/30/2019   Procedure: INSERTION PORT-A-CATH;  Surgeon: Olean Ree, MD;  Location: [facility];  Service: General;  Laterality: Right;    Prior to Admission medications   Medication Sig Start Date End Date Taking? Authorizing Provider  acetaminophen (TYLENOL) 500 MG tablet Take 500 mg by mouth every 6 (six) hours as needed for mild pain or moderate pain.    [provider]  aspirin 81 MG tablet Take 81 mg by mouth daily.     [provider]  atorvastatin (LIPITOR) 40 MG tablet Take 1 tablet (40 mg total) by mouth daily. 05/01/20   Park Liter P, DO  clopidogrel (PLAVIX) 75 MG tablet Take 1 tablet (75 mg total) by mouth daily. 05/01/20   Johnson, Megan P, DO  diphenoxylate-atropine (LOMOTIL) 2.5-0.025 MG tablet Take 2 tablets by mouth 4 (four) times daily as needed for diarrhea or loose stools. Patient not taking: Reported on 04/28/2020 03/08/20   [name], MD  levothyroxine (SYNTHROID) 50 MCG tablet Take 1 tablet (50 mcg total) by mouth daily. 02/15/20   Johnson, Megan P, DO  lisinopril (ZESTRIL) 2.5 MG tablet Take 1 tablet (2.5 mg total) by mouth every morning. 05/01/20   Johnson, Megan P, DO  loperamide (IMODIUM A-D) 2 MG  tablet Take 2 mg by mouth 4 (four) times daily as needed for diarrhea or loose stools.  Patient not taking: Reported on 05/01/2020    [provider]  metoprolol succinate (TOPROL-XL) 25 MG 24 hr tablet Take 1 tablet (25 mg total) by mouth daily. 05/01/20   Park Liter P, DO  multivitamin-iron-minerals-folic acid (CENTRUM) chewable tablet Chew 1 tablet by mouth daily.    [provider]  nitroGLYCERIN (NITROSTAT) 0.4 MG SL tablet Place 1 tablet under the tongue every 5 (five) minutes as needed for chest pain.  Patient not taking: Reported on 05/01/2020 03/08/16   [provider]  ondansetron (ZOFRAN) 8 MG tablet Take 1 tablet (8 mg total) by mouth 2 (two) times daily as needed for refractory nausea / vomiting. Start on day 3 after chemotherapy. 08/26/19   [name], MD  pantoprazole (PROTONIX) 20 MG tablet TAKE ONE TABLET BY MOUTH ONCE DAILY 03/11/20   Park Liter P, DO  prochlorperazine (COMPAZINE) 10 MG tablet Take 1 tablet (10 mg total) by mouth every 6 (six) hours as needed (Nausea or vomiting). 08/26/19   [name],  MD    Allergies Patient has no known allergies.  Family History  Problem Relation Age of Onset   Hypertension Mother    Dementia Mother    Heart disease Father    Hypertension Father    Kidney disease Father    Hypertension Brother    Heart disease Brother    Diabetes Son    Hypertension Son    Hyperlipidemia Son    Hyperlipidemia Daughter    Hypertension Daughter     Social History Social History   Tobacco Use   Smoking status: Former Smoker    Packs/day: 1.00    Years: 40.00    Pack years: 40.00    Types: Cigarettes    Quit date: 07/16/2004    Years since quitting: 15.8   Smokeless tobacco: Former Systems developer    Types: Nurse, children's Use: Never used  Substance Use Topics   Alcohol use: Yes    Alcohol/week: 1.0 standard drink    Types: 1 Cans of beer per week   Drug use: No     Review of Systems  Constitutional: + fever, generalized weakness Eyes: Negative for visual changes. ENT: Negative for sore throat. Neck: No neck pain  Cardiovascular: +chest pain. Respiratory: Negative for shortness of breath. + cough Gastrointestinal: Negative for abdominal pain, vomiting or diarrhea. Genitourinary: Negative for dysuria. Musculoskeletal: Negative for back pain. Skin: Negative for rash. Neurological: Negative for headaches, weakness or numbness. Psych: No SI or HI  ____________________________________________   PHYSICAL EXAM:  VITAL SIGNS: ED Triage Vitals  Enc Vitals Group     BP 05/21/20 2108 (!) 84/61     Pulse Rate 05/21/20 2108 86     Resp 05/21/20 2108 (!) 22     Temp 05/21/20 2108 99.1 F (37.3 C)     Temp Source 05/21/20 2108 Oral     SpO2 05/21/20 2108 (!) 89 %     Weight 05/21/20 2105 125 lb (56.7 kg)     Height 05/21/20 2105 5\' 9"  (1.753 m)     Head Circumference --      Peak Flow --      Pain Score 05/21/20 2105 0     Pain Loc --      Pain Edu? --      Excl. in [place]? --     Constitutional: Alert and oriented, chronically ill appearing, no distress.  HEENT:      Head: Normocephalic and atraumatic.         Eyes: Conjunctivae are normal. Sclera is non-icteric.       Mouth/Throat: Mucous membranes are moist.       Neck: Supple with no signs of meningismus. Cardiovascular: Regular rate and rhythm. No murmurs, gallops, or rubs. 2+ symmetrical distal pulses are present in all extremities. No JVD. Respiratory: Inormal work of breathing with coarse rhonchi and crackles on the right, hypoxic to 89% which responded on 3 L nasal cannula.   Gastrointestinal: Soft, non tender. Musculoskeletal:  No edema, cyanosis, or erythema of extremities. Neurologic: Normal speech and language. Face is symmetric. Moving all extremities. No gross focal neurologic deficits are appreciated. Skin: Skin is warm, dry and intact. No rash noted. Psychiatric: Mood and  affect are normal. Speech and behavior are normal.  ____________________________________________   LABS (all labs ordered are listed, but only abnormal results are displayed)  Labs Reviewed  COMPREHENSIVE METABOLIC PANEL - Abnormal; Notable for the following components:      Result Value  Glucose, Bld 111 (*)    BUN 24 (*)    Creatinine, Ser 1.55 (*)    Calcium 8.7 (*)    Total Protein 8.2 (*)    Albumin 2.9 (*)    GFR calc non Af Amer 44 (*)    GFR calc Af Amer 51 (*)    All other components within normal limits  CBC WITH DIFFERENTIAL/PLATELET - Abnormal; Notable for the following components:   RBC 2.98 (*)    Hemoglobin 9.2 (*)    HCT 29.3 (*)    RDW 19.0 (*)    Neutro Abs 8.2 (*)    All other components within normal limits  SARS CORONAVIRUS 2 BY RT PCR (HOSPITAL ORDER, [facility])  CULTURE, BLOOD (ROUTINE X 2)  CULTURE, BLOOD (ROUTINE X 2)  LACTIC ACID, PLASMA  PROTIME-INR  PROCALCITONIN  LACTIC ACID, PLASMA  URINALYSIS, COMPLETE (UACMP) WITH MICROSCOPIC  TROPONIN I (HIGH SENSITIVITY)   ____________________________________________  EKG  ED ECG REPORT I, Rudene Re, the attending physician, personally viewed and interpreted this ECG.  Normal sinus rhythm, rate of 84, normal QTC, normal axis, T wave inversions in inferior leads with no ST elevations or depressions.  Unchanged from prior. ____________________________________________  RADIOLOGY  I have personally reviewed the images performed during this visit and I agree with the Radiologist's read.   Interpretation by Radiologist:  DG Chest 2 View  Result Date: 05/21/2020 CLINICAL DATA:  Suspected sepsis. Productive cough and fever. Recently treated for pneumonia with persistent cough. Patient with history of esophageal cancer. EXAM: CHEST - 2 VIEW COMPARISON:  Chest radiograph 04/21/2020.  PET CT 04/27/2020 FINDINGS: Right chest port in place. Air-fluid level within distal  esophagus as seen on prior PET. Chronic blunting of the right costophrenic angle, however suspect increase in pleural effusion. Progressive patchy opacities in the superior segment of the right upper lobe from prior exam. Slight increase in right basilar opacities. Left lung is clear. Stable heart size and mediastinal contours. No pneumothorax. IMPRESSION: 1. Progressive patchy opacities in the right upper and lower lobes from prior exam. Findings suspicious for persistent and worsening pneumonia, consider aspiration. Increased right pleural effusion. 2. Air-fluid level within the distal esophagus as seen on prior PET. Electronically Signed   By: Keith Rake M.D.   On: 05/21/2020 22:21   CT Angio Chest PE W and/or Wo Contrast  Result Date: 05/22/2020 CLINICAL DATA:  Weakness and productive cough. Diagnosed with pneumonia a couple of weeks ago. Decreased oxygen saturation. Decreased blood pressure. EXAM: CT ANGIOGRAPHY CHEST WITH CONTRAST TECHNIQUE: Multidetector CT imaging of the chest was performed using the standard protocol during bolus administration of intravenous contrast. Multiplanar CT image reconstructions and MIPs were obtained to evaluate the vascular anatomy. CONTRAST:  19mL OMNIPAQUE IOHEXOL 350 MG/ML SOLN COMPARISON:  CT chest 06/26/2016.  PET-CT 04/27/2020 FINDINGS: Cardiovascular: Satisfactory opacification of the pulmonary arteries to the segmental level. No evidence of pulmonary embolism. Normal heart size. No pericardial effusion. Coronary artery calcifications. Mediastinum/Nodes: There appears to been esophageal resection with gastric pull up procedure. Moderately prominent lymph nodes in the mediastinum, nonspecific. Pretracheal lymph nodes measure about 1.6 cm maximal dimension. Lungs/Pleura: Small left pleural effusion. Nodular infiltrates throughout the right lung with focal consolidation in the right lower lung. Patchy nodular infiltrates in the left lower lung. Mild interstitial  thickening. Changes could be due to pneumonia, metastasis, or aspiration. Upper Abdomen: Prominent soft tissue density replaces the fat planes at the celiac axis. This could  indicate lymphadenopathy or possibly postoperative change. Musculoskeletal: No chest wall abnormality. No acute or significant osseous findings. Review of the MIP images confirms the above findings. IMPRESSION: 1. No evidence of significant pulmonary embolus. 2. Small left pleural effusion. 3. Nodular ground-glass infiltrates throughout the right lung with consolidation in the right lower lung. Patchy nodular infiltrates in the left lower lung. Changes could be due to pneumonia, metastasis, or aspiration. This is progressing since previous PET-CT. 4. Moderately prominent lymph nodes in the mediastinum, nonspecific but could indicate metastatic disease. 5. Prominent soft tissue density replaces the fat planes at the celiac axis. This could indicate lymphadenopathy or possibly postoperative change. Similar appearance to previous PET-CT. Electronically Signed   By: Lucienne Capers M.D.   On: 05/22/2020 00:15    ____________________________________________   PROCEDURES  Procedure(s) performed:yes .1-3 Lead EKG Interpretation Performed by: Rudene Re, MD Authorized by: Rudene Re, MD     Interpretation: normal     ECG rate assessment: normal     Rhythm: sinus rhythm     Ectopy: none     Critical Care performed: yes  CRITICAL CARE Performed by: Rudene Re  ?  Total critical care time: 40 min  Critical care time was exclusive of separately billable procedures and treating other patients.  Critical care was necessary to treat or prevent imminent or life-threatening deterioration.  Critical care was time spent personally by me on the following activities: development of treatment plan with patient and/or surrogate as well as nursing, discussions with consultants, evaluation of patient's response to  treatment, examination of patient, obtaining history from patient or surrogate, ordering and performing treatments and interventions, ordering and review of laboratory studies, ordering and review of radiographic studies, pulse oximetry and re-evaluation of patient's condition.  ____________________________________________   INITIAL IMPRESSION / ASSESSMENT AND PLAN / ED COURSE   74 y.o. male with a history of esophageal cancer status post chemo, A. fib, CAD on Plavix, hypertension, hyperlipidemia, kidney stones who presents for evaluation of persistent productive cough and fever x 3 weeks.  Patient looks chronically ill but in no acute distress, he does have normal work of breathing with coarse rhonchi and crackles on the right and hypoxia which improved on 3 L nasal cannula.  Patient looks euvolemic.  Differential diagnoses including bacterial pneumonia versus Covid versus PE versus myocarditis.  Chest x-ray consistent with right-sided pneumonia, confirmed by radiology.  Patient here has a low-grade temp but is extremely hypotensive with BP of 75/48.  Labs showing normal white count, negative lactic acidosis, stable anemia, mildly increased creatinine.  With no recent hospitalizations or chemotherapy will start patient on Rocephin and azithromycin.  Will give IV fluids for hypotension.  We will get a CT angio to rule out a PE.  Will admit to the hospitalist service.  History gathered from patient and his daughter who was at bedside.  Plan discussed with both of them.  Patient will be admitted to the hospital.  Patient placed on telemetry for close monitoring.  Old medical records reviewed.  _________________________ 12:21 AM on 05/22/2020 -----------------------------------------  CT negative for PE showing groundglass infiltrates bilaterally which according to radiology could be due to pneumonia versus metastatic disease.  Procalcitonin is elevated.  Covid is pending.  Will consult hospitalist for  admission   _________________________ 12:26 AM on 05/22/2020 -----------------------------------------  Covid negative _____________________________________________ Please note:  Patient was evaluated in Emergency Department today for the symptoms described in the history of present illness. Patient was evaluated in the  context of the global COVID-19 pandemic, which necessitated consideration that the patient might be at risk for infection with the SARS-CoV-2 virus that causes COVID-19. Institutional protocols and algorithms that pertain to the evaluation of patients at risk for COVID-19 are in a state of rapid change based on information released by regulatory bodies including the CDC and federal and state organizations. These policies and algorithms were followed during the patient's care in the ED.  Some ED evaluations and interventions may be delayed as a result of limited staffing during the pandemic.   [place] Controlled Substance Database was reviewed by me. ____________________________________________   FINAL CLINICAL IMPRESSION(S) / ED DIAGNOSES   Final diagnoses:  Acute respiratory failure with hypoxia ([place])  Community acquired pneumonia of right lung, unspecified part of lung      NEW MEDICATIONS STARTED DURING THIS VISIT:  ED Discharge Orders    None       Note:  This document was prepared using Dragon voice recognition software and may include unintentional dictation errors.    Alfred Levins, [place], MD 05/22/20 [phone]

## 2020-05-21 NOTE — ED Notes (Signed)
Pt transported to CT ?

## 2020-05-21 NOTE — ED Triage Notes (Signed)
Patient recently treated for pneumonia but still having productive cough and fever (101.5 at home) and not eating well.

## 2020-05-21 NOTE — ED Notes (Addendum)
Pt c/o IV pain, IV flushed at this time, pt states IV pain improved. BP rechecked as well, continued to be hypotensive, taken to room 9

## 2020-05-21 NOTE — ED Notes (Addendum)
Pt arrives from home with his daughter.  He was very weak tonight, diagnosed with pneumonia a couple of weeks ago, has had a productive cough, sputum has been green.  In triage pt was placed on 2L O2 due to sats in the upper 80s on room air.  O2 sat is currently 96% on 2L.  BP 79/55 at this time, bolus of LR is infusing.  Hx of esophageal cancer, is not currently receiving chemo.

## 2020-05-22 ENCOUNTER — Encounter: Payer: Self-pay | Admitting: Family Medicine

## 2020-05-22 DIAGNOSIS — T451X5A Adverse effect of antineoplastic and immunosuppressive drugs, initial encounter: Secondary | ICD-10-CM | POA: Diagnosis present

## 2020-05-22 DIAGNOSIS — Z681 Body mass index (BMI) 19 or less, adult: Secondary | ICD-10-CM | POA: Diagnosis not present

## 2020-05-22 DIAGNOSIS — I480 Paroxysmal atrial fibrillation: Secondary | ICD-10-CM | POA: Diagnosis present

## 2020-05-22 DIAGNOSIS — N183 Chronic kidney disease, stage 3 unspecified: Secondary | ICD-10-CM

## 2020-05-22 DIAGNOSIS — G4733 Obstructive sleep apnea (adult) (pediatric): Secondary | ICD-10-CM | POA: Diagnosis present

## 2020-05-22 DIAGNOSIS — D6481 Anemia due to antineoplastic chemotherapy: Secondary | ICD-10-CM | POA: Diagnosis present

## 2020-05-22 DIAGNOSIS — J690 Pneumonitis due to inhalation of food and vomit: Secondary | ICD-10-CM

## 2020-05-22 DIAGNOSIS — C155 Malignant neoplasm of lower third of esophagus: Secondary | ICD-10-CM | POA: Diagnosis not present

## 2020-05-22 DIAGNOSIS — E039 Hypothyroidism, unspecified: Secondary | ICD-10-CM | POA: Diagnosis present

## 2020-05-22 DIAGNOSIS — E871 Hypo-osmolality and hyponatremia: Secondary | ICD-10-CM | POA: Diagnosis not present

## 2020-05-22 DIAGNOSIS — Z20822 Contact with and (suspected) exposure to covid-19: Secondary | ICD-10-CM | POA: Diagnosis present

## 2020-05-22 DIAGNOSIS — D696 Thrombocytopenia, unspecified: Secondary | ICD-10-CM | POA: Diagnosis present

## 2020-05-22 DIAGNOSIS — R6521 Severe sepsis with septic shock: Secondary | ICD-10-CM | POA: Diagnosis present

## 2020-05-22 DIAGNOSIS — J9621 Acute and chronic respiratory failure with hypoxia: Secondary | ICD-10-CM

## 2020-05-22 DIAGNOSIS — Z8249 Family history of ischemic heart disease and other diseases of the circulatory system: Secondary | ICD-10-CM | POA: Diagnosis not present

## 2020-05-22 DIAGNOSIS — I129 Hypertensive chronic kidney disease with stage 1 through stage 4 chronic kidney disease, or unspecified chronic kidney disease: Secondary | ICD-10-CM | POA: Diagnosis present

## 2020-05-22 DIAGNOSIS — N1831 Chronic kidney disease, stage 3a: Secondary | ICD-10-CM | POA: Diagnosis present

## 2020-05-22 DIAGNOSIS — J189 Pneumonia, unspecified organism: Secondary | ICD-10-CM | POA: Diagnosis not present

## 2020-05-22 DIAGNOSIS — N179 Acute kidney failure, unspecified: Secondary | ICD-10-CM | POA: Diagnosis not present

## 2020-05-22 DIAGNOSIS — I2510 Atherosclerotic heart disease of native coronary artery without angina pectoris: Secondary | ICD-10-CM | POA: Diagnosis present

## 2020-05-22 DIAGNOSIS — Z79890 Hormone replacement therapy (postmenopausal): Secondary | ICD-10-CM | POA: Diagnosis not present

## 2020-05-22 DIAGNOSIS — C159 Malignant neoplasm of esophagus, unspecified: Secondary | ICD-10-CM | POA: Diagnosis present

## 2020-05-22 DIAGNOSIS — J9601 Acute respiratory failure with hypoxia: Secondary | ICD-10-CM | POA: Diagnosis present

## 2020-05-22 DIAGNOSIS — I252 Old myocardial infarction: Secondary | ICD-10-CM | POA: Diagnosis not present

## 2020-05-22 DIAGNOSIS — I959 Hypotension, unspecified: Secondary | ICD-10-CM

## 2020-05-22 DIAGNOSIS — E785 Hyperlipidemia, unspecified: Secondary | ICD-10-CM | POA: Diagnosis present

## 2020-05-22 DIAGNOSIS — N400 Enlarged prostate without lower urinary tract symptoms: Secondary | ICD-10-CM | POA: Diagnosis present

## 2020-05-22 DIAGNOSIS — E440 Moderate protein-calorie malnutrition: Secondary | ICD-10-CM | POA: Diagnosis present

## 2020-05-22 DIAGNOSIS — A419 Sepsis, unspecified organism: Secondary | ICD-10-CM | POA: Diagnosis present

## 2020-05-22 LAB — URINALYSIS, COMPLETE (UACMP) WITH MICROSCOPIC
Bacteria, UA: NONE SEEN
Bilirubin Urine: NEGATIVE
Glucose, UA: NEGATIVE mg/dL
Hgb urine dipstick: NEGATIVE
Ketones, ur: NEGATIVE mg/dL
Leukocytes,Ua: NEGATIVE
Nitrite: NEGATIVE
Protein, ur: NEGATIVE mg/dL
Specific Gravity, Urine: >1.046 — ABNORMAL HIGH (ref 1.005–1.030)
pH: 5 (ref 5.0–8.0)

## 2020-05-22 LAB — BASIC METABOLIC PANEL
Anion gap: 6 (ref 5–15)
BUN: 26 mg/dL — ABNORMAL HIGH (ref 8–23)
CO2: 27 mmol/L (ref 22–32)
Calcium: 8.1 mg/dL — ABNORMAL LOW (ref 8.9–10.3)
Chloride: 98 mmol/L (ref 98–111)
Creatinine, Ser: 1.51 mg/dL — ABNORMAL HIGH (ref 0.61–1.24)
GFR calc Af Amer: 52 mL/min — ABNORMAL LOW (ref >60)
GFR calc non Af Amer: 45 mL/min — ABNORMAL LOW (ref >60)
Glucose, Bld: 127 mg/dL — ABNORMAL HIGH (ref 70–99)
Potassium: 5 mmol/L (ref 3.5–5.1)
Sodium: 131 mmol/L — ABNORMAL LOW (ref 135–145)

## 2020-05-22 LAB — CBC
HCT: 23.8 % — ABNORMAL LOW (ref 39.0–52.0)
Hemoglobin: 7.2 g/dL — ABNORMAL LOW (ref 13.0–17.0)
MCH: 30.8 pg (ref 26.0–34.0)
MCHC: 30.3 g/dL (ref 30.0–36.0)
MCV: 101.7 fL — ABNORMAL HIGH (ref 80.0–100.0)
Platelets: 141 10*3/uL — ABNORMAL LOW (ref 150–400)
RBC: 2.34 MIL/uL — ABNORMAL LOW (ref 4.22–5.81)
RDW: 19.1 % — ABNORMAL HIGH (ref 11.5–15.5)
WBC: 8.5 10*3/uL (ref 4.0–10.5)
nRBC: 0 % (ref 0.0–0.2)

## 2020-05-22 LAB — CREATININE, SERUM
Creatinine, Ser: 1.53 mg/dL — ABNORMAL HIGH (ref 0.61–1.24)
GFR calc Af Amer: 52 mL/min — ABNORMAL LOW (ref >60)
GFR calc non Af Amer: 44 mL/min — ABNORMAL LOW (ref >60)

## 2020-05-22 LAB — EXPECTORATED SPUTUM ASSESSMENT W GRAM STAIN, RFLX TO RESP C

## 2020-05-22 LAB — SARS CORONAVIRUS 2 BY RT PCR (HOSPITAL ORDER, PERFORMED IN ~~LOC~~ HOSPITAL LAB): SARS Coronavirus 2: NEGATIVE

## 2020-05-22 LAB — TROPONIN I (HIGH SENSITIVITY): Troponin I (High Sensitivity): 10 ng/L (ref <18)

## 2020-05-22 MED ORDER — ASPIRIN EC 81 MG PO TBEC
81.0000 mg | DELAYED_RELEASE_TABLET | Freq: Every day | ORAL | Status: DC
  Administered 2020-05-22 – 2020-05-24 (×3): 81 mg via ORAL
  Filled 2020-05-22 (×3): qty 1

## 2020-05-22 MED ORDER — CLOPIDOGREL BISULFATE 75 MG PO TABS
75.0000 mg | ORAL_TABLET | Freq: Every day | ORAL | Status: DC
  Administered 2020-05-22 – 2020-05-24 (×3): 75 mg via ORAL
  Filled 2020-05-22 (×3): qty 1

## 2020-05-22 MED ORDER — NOREPINEPHRINE 4 MG/250ML-% IV SOLN: 0.0000 ug/min | INTRAVENOUS | Status: DC

## 2020-05-22 MED ORDER — ACETAMINOPHEN 325 MG PO TABS: 650.0000 mg | ORAL_TABLET | Freq: Four times a day (QID) | ORAL | Status: DC | PRN

## 2020-05-22 MED ORDER — SODIUM CHLORIDE 0.9 % IV SOLN
INTRAVENOUS | Status: DC | PRN
  Administered 2020-05-22 – 2020-05-23 (×2): 250 mL via INTRAVENOUS

## 2020-05-22 MED ORDER — CHLORHEXIDINE GLUCONATE CLOTH 2 % EX PADS
6.0000 | MEDICATED_PAD | Freq: Every day | CUTANEOUS | Status: DC
  Administered 2020-05-22 – 2020-05-24 (×3): 6 via TOPICAL

## 2020-05-22 MED ORDER — SODIUM CHLORIDE 0.9 % IV SOLN
2.0000 g | Freq: Two times a day (BID) | INTRAVENOUS | Status: DC
  Administered 2020-05-22 – 2020-05-24 (×4): 2 g via INTRAVENOUS
  Filled 2020-05-22 (×6): qty 2

## 2020-05-22 MED ORDER — VANCOMYCIN HCL 750 MG/150ML IV SOLN
750.0000 mg | INTRAVENOUS | Status: DC
  Administered 2020-05-23: 750 mg via INTRAVENOUS
  Filled 2020-05-22 (×3): qty 150

## 2020-05-22 MED ORDER — VANCOMYCIN HCL IN DEXTROSE 1-5 GM/200ML-% IV SOLN
1000.0000 mg | Freq: Once | INTRAVENOUS | Status: AC
  Administered 2020-05-22: 1000 mg via INTRAVENOUS
  Filled 2020-05-22: qty 200

## 2020-05-22 MED ORDER — LEVOTHYROXINE SODIUM 50 MCG PO TABS
50.0000 ug | ORAL_TABLET | Freq: Every day | ORAL | Status: DC
  Administered 2020-05-22 – 2020-05-24 (×3): 50 ug via ORAL
  Filled 2020-05-22 (×3): qty 1

## 2020-05-22 MED ORDER — SODIUM CHLORIDE 0.9 % IV SOLN
1.0000 g | Freq: Three times a day (TID) | INTRAVENOUS | Status: DC
  Administered 2020-05-22: 1 g via INTRAVENOUS
  Filled 2020-05-22: qty 1

## 2020-05-22 MED ORDER — LACTATED RINGERS IV SOLN: INTRAVENOUS | Status: DC

## 2020-05-22 MED ORDER — AZITHROMYCIN 500 MG PO TABS
500.0000 mg | ORAL_TABLET | Freq: Every day | ORAL | Status: DC
  Administered 2020-05-23 – 2020-05-24 (×2): 500 mg via ORAL
  Filled 2020-05-22 (×2): qty 1

## 2020-05-22 MED ORDER — ENOXAPARIN SODIUM 40 MG/0.4ML ~~LOC~~ SOLN
40.0000 mg | SUBCUTANEOUS | Status: DC
  Administered 2020-05-22 – 2020-05-24 (×3): 40 mg via SUBCUTANEOUS
  Filled 2020-05-22 (×3): qty 0.4

## 2020-05-22 MED ORDER — ATORVASTATIN CALCIUM 20 MG PO TABS
40.0000 mg | ORAL_TABLET | Freq: Every day | ORAL | Status: DC
  Administered 2020-05-22 – 2020-05-24 (×3): 40 mg via ORAL
  Filled 2020-05-22 (×3): qty 2

## 2020-05-22 NOTE — Progress Notes (Signed)
Pharmacy Antibiotic Note  Corey Perry is a 74 y.o. male admitted on 05/21/2020 with pneumonia.  Pharmacy has been consulted for Vancomycin dosing.  Plan: Vancomycin 1gm x 1 then 750mg  IV q24hrs (renally adjusted per nomogram dosing)  Height: 5\' 9"  (175.3 cm) Weight: 56.7 kg (125 lb) IBW/kg (Calculated) : 70.7  Temp (24hrs), Avg:99.1 F (37.3 C), Min:99.1 F (37.3 C), Max:99.1 F (37.3 C)  Recent Labs  Lab 05/21/20 2121 05/21/20 2122  WBC 9.5  --   CREATININE 1.55*  --   LATICACIDVEN  --  1.7    Estimated Creatinine Clearance: 34 mL/min (A) (by C-G formula based on SCr of 1.55 mg/dL (H)).    No Known Allergies  Antimicrobials this admission:   >>    >>   Dose adjustments this admission:   Microbiology results:  BCx:   UCx:    Sputum:    MRSA PCR:   Thank you for allowing pharmacy to be a part of this patient's care.  Hart Robinsons A 05/22/2020 2:41 AM

## 2020-05-22 NOTE — ED Notes (Signed)
Report from [place], [place].

## 2020-05-22 NOTE — ED Notes (Signed)
Report to jennifer.

## 2020-05-22 NOTE — Progress Notes (Signed)
PROGRESS NOTE    HAMILTON MARINELLO  KDT:[id number] DOB: Apr 29, 1946 DOA: 05/21/2020 PCP: Valerie Roys, DO      Brief Narrative:  Mr. Behrle is a 74 y.o. M with Afib not on AC, CAD, hypothyroidism, OSA not on CPAP and esophageal cancer in 2017 s/p chemoRad, recurrent in 2021 recently completed FOLFOX who presented with recurrent pneumonia for 3 weeks.  Patient first developed fever, cough, SOB 3 weeks ago, started on  Augmentin/doxycycline without improvement.  Progressed to diminished appetite, decreased oral intake and generalized weakness.  On day of admission, fever to 101.28F at home, so presented to ER.  In the ER, CTA chest showed no PE but multifocal pneumonia vs metastasis in the left lung base.  Started on Rocephin and azithromycin.       Assessment & Plan:  Multifocal pneumonia Sepsis Patient meets sepsis criteria with multifocal pneumonia with low blood pressure, fever.   -Continue vancomycin and Cefepime -Obtain sputum culture -Follow blood cultures -MRSA nares and stop vanc if able   Anemia of chemo Hgb down to 7.2 g/dL, no clinical beeding -Transfusion threshold 7 g/dL  Esophageal cancer  Patient with known esophageal cancer being followed by oncology.  Currently undergoing chemotherapy with oncology  Patient with possible metastasis in the lungs.  Will need further evaluation with oncology after treatment of the pneumonia   CKD IIIa  Acute respiratory failure with hypoxia SpO2 < 90% and tachypneic.  Atrial fibrillation, paroxysmal -Continue   Coronary disease, secondary prevention -Continue aspirin, Plavix and atorvastatin -Hold metoprolol and lisinopril due to hypotension  Hypothyroidism -Continue levothyroxine  OSA Not on CPAP  Hyponatremia Stable, asymptomatic         Disposition: Status is: Inpatient  Remains inpatient appropriate because:Inpatient level of care appropriate due to severity of illness   Dispo: The  patient is from: Home              Anticipated d/c is to: Home              Anticipated d/c date is: 1 day              Patient currently is not medically stable to d/c.              MDM: This is a no charge note.  For further details, please see H&P by my partner Dr. Tonie Griffith from earlier today.  The below labs and imaging reports were reviewed and summarized above.    DVT prophylaxis: enoxaparin (LOVENOX) injection 40 mg Start: 05/22/20 1000  Code Status: FULl Family Communication: Daughter    Consultants:     Procedures:     Antimicrobials:      Culture data:              Subjective: Patient feeling better.  Mentating well.         Objective: Vitals:   05/22/20 0430 05/22/20 0500 05/22/20 0530 05/22/20 0630  BP: 100/62 99/67 (!) 102/59 (!) 92/55  Pulse: (!) 54  (!) 59 (!) 54  Resp: 15 18 (!) 22 17  Temp:      TempSrc:      SpO2: 94%  98% 100%  Weight:      Height:        Intake/Output Summary (Last 24 hours) at 05/22/2020 0743 Last data filed at 05/22/2020 0510 Gross per 24 hour  Intake 2300 ml  Output --  Net 2300 ml   Filed Weights   05/21/20 2105  Weight:  56.7 kg    Examination: The patient was seen and examined.      Data Reviewed: I have personally reviewed following labs and imaging studies:  CBC: Recent Labs  Lab 05/21/20 2121 05/22/20 0333  WBC 9.5 8.5  NEUTROABS 8.2*  --   HGB 9.2* 7.2*  HCT 29.3* 23.8*  MCV 98.3 101.7*  PLT 195 914*   Basic Metabolic Panel: Recent Labs  Lab 05/21/20 2121 05/22/20 0333  NA 136 131*  K 5.1 5.0  CL 100 98  CO2 29 27  GLUCOSE 111* 127*  BUN 24* 26*  CREATININE 1.55* 1.51*  1.53*  CALCIUM 8.7* 8.1*   GFR: Estimated Creatinine Clearance: 34.5 mL/min (A) (by C-G formula based on SCr of 1.53 mg/dL (H)). Liver Function Tests: Recent Labs  Lab 05/21/20 2121  AST 31  ALT 30  ALKPHOS 59  BILITOT 0.7  PROT 8.2*  ALBUMIN 2.9*   No results for input(s):  LIPASE, AMYLASE in the last 168 hours. No results for input(s): AMMONIA in the last 168 hours. Coagulation Profile: Recent Labs  Lab 05/21/20 2121  INR 1.2   Cardiac Enzymes: No results for input(s): CKTOTAL, CKMB, CKMBINDEX, TROPONINI in the last 168 hours. BNP (last 3 results) No results for input(s): PROBNP in the last 8760 hours. HbA1C: No results for input(s): HGBA1C in the last 72 hours. CBG: No results for input(s): GLUCAP in the last 168 hours. Lipid Profile: No results for input(s): CHOL, HDL, LDLCALC, TRIG, CHOLHDL, LDLDIRECT in the last 72 hours. Thyroid Function Tests: No results for input(s): TSH, T4TOTAL, FREET4, T3FREE, THYROIDAB in the last 72 hours. Anemia Panel: No results for input(s): VITAMINB12, FOLATE, FERRITIN, TIBC, IRON, RETICCTPCT in the last 72 hours. Urine analysis:    Component Value Date/Time   COLORURINE YELLOW (A) 05/21/2020 2251   APPEARANCEUR CLEAR (A) 05/21/2020 2251   APPEARANCEUR Cloudy (A) 06/23/2019 0822   LABSPEC >1.046 (H) 05/21/2020 2251   PHURINE 5.0 05/21/2020 2251   GLUCOSEU NEGATIVE 05/21/2020 2251   HGBUR NEGATIVE 05/21/2020 2251   BILIRUBINUR NEGATIVE 05/21/2020 2251   BILIRUBINUR Negative 06/23/2019 0822   KETONESUR NEGATIVE 05/21/2020 2251   PROTEINUR NEGATIVE 05/21/2020 2251   NITRITE NEGATIVE 05/21/2020 2251   LEUKOCYTESUR NEGATIVE 05/21/2020 2251   Sepsis Labs: @LABRCNTIP (procalcitonin:4,lacticacidven:4)  ) Recent Results (from the past 240 hour(s))  Culture, blood (Routine x 2)     Status: None (Preliminary result)   Collection Time: 05/21/20  9:22 PM   Specimen: BLOOD  Result Value Ref Range Status   Specimen Description BLOOD BLOOD RIGHT FOREARM  Final   Special Requests   Final    BOTTLES DRAWN AEROBIC AND ANAEROBIC Blood Culture results may not be optimal due to an excessive volume of blood received in culture bottles   Culture   Final    NO GROWTH < 12 HOURS Performed at [facility], [address], [place], [place] [address]    Report Status PENDING  Incomplete  SARS Coronavirus 2 by RT PCR (hospital order, performed in [place] hospital lab) Nasopharyngeal Nasopharyngeal Swab     Status: None   Collection Time: 05/21/20 10:51 PM   Specimen: Nasopharyngeal Swab  Result Value Ref Range Status   SARS Coronavirus 2 NEGATIVE NEGATIVE Final    Comment: (NOTE) SARS-CoV-2 target nucleic acids are NOT DETECTED.  The SARS-CoV-2 RNA is generally detectable in upper and lower respiratory specimens during the acute phase of infection. The lowest concentration of SARS-CoV-2 viral copies this assay  can detect is 250 copies / mL. A negative result does not preclude SARS-CoV-2 infection and should not be used as the sole basis for treatment or other patient management decisions.  A negative result may occur with improper specimen collection / handling, submission of specimen other than nasopharyngeal swab, presence of viral mutation(s) within the areas targeted by this assay, and inadequate number of viral copies (<250 copies / mL). A negative result must be combined with clinical observations, patient history, and epidemiological information.  Fact Sheet for Patients:   StrictlyIdeas.no  Fact Sheet for Healthcare Providers: BankingDealers.co.za  This test is not yet approved or  cleared by the [place] FDA and has been authorized for detection and/or diagnosis of SARS-CoV-2 by FDA under an Emergency Use Authorization (EUA).  This EUA will remain in effect (meaning this test can be used) for the duration of the COVID-19 declaration under Section 564(b)(1) of the Act, 21 U.S.C. section 360bbb-3(b)(1), unless the authorization is terminated or revoked sooner.  Performed at [facility], [address], [place], [place] [address]          Radiology Studies: DG Chest 2 View  Result Date: 05/21/2020 CLINICAL DATA:   Suspected sepsis. Productive cough and fever. Recently treated for pneumonia with persistent cough. Patient with history of esophageal cancer. EXAM: CHEST - 2 VIEW COMPARISON:  Chest radiograph 04/21/2020.  PET CT 04/27/2020 FINDINGS: Right chest port in place. Air-fluid level within distal esophagus as seen on prior PET. Chronic blunting of the right costophrenic angle, however suspect increase in pleural effusion. Progressive patchy opacities in the superior segment of the right upper lobe from prior exam. Slight increase in right basilar opacities. Left lung is clear. Stable heart size and mediastinal contours. No pneumothorax. IMPRESSION: 1. Progressive patchy opacities in the right upper and lower lobes from prior exam. Findings suspicious for persistent and worsening pneumonia, consider aspiration. Increased right pleural effusion. 2. Air-fluid level within the distal esophagus as seen on prior PET. Electronically Signed   By: Keith Rake M.D.   On: 05/21/2020 22:21   CT Angio Chest PE W and/or Wo Contrast  Result Date: 05/22/2020 CLINICAL DATA:  Weakness and productive cough. Diagnosed with pneumonia a couple of weeks ago. Decreased oxygen saturation. Decreased blood pressure. EXAM: CT ANGIOGRAPHY CHEST WITH CONTRAST TECHNIQUE: Multidetector CT imaging of the chest was performed using the standard protocol during bolus administration of intravenous contrast. Multiplanar CT image reconstructions and MIPs were obtained to evaluate the vascular anatomy. CONTRAST:  89mL OMNIPAQUE IOHEXOL 350 MG/ML SOLN COMPARISON:  CT chest 06/26/2016.  PET-CT 04/27/2020 FINDINGS: Cardiovascular: Satisfactory opacification of the pulmonary arteries to the segmental level. No evidence of pulmonary embolism. Normal heart size. No pericardial effusion. Coronary artery calcifications. Mediastinum/Nodes: There appears to been esophageal resection with gastric pull up procedure. Moderately prominent lymph nodes in the  mediastinum, nonspecific. Pretracheal lymph nodes measure about 1.6 cm maximal dimension. Lungs/Pleura: Small left pleural effusion. Nodular infiltrates throughout the right lung with focal consolidation in the right lower lung. Patchy nodular infiltrates in the left lower lung. Mild interstitial thickening. Changes could be due to pneumonia, metastasis, or aspiration. Upper Abdomen: Prominent soft tissue density replaces the fat planes at the celiac axis. This could indicate lymphadenopathy or possibly postoperative change. Musculoskeletal: No chest wall abnormality. No acute or significant osseous findings. Review of the MIP images confirms the above findings. IMPRESSION: 1. No evidence of significant pulmonary embolus. 2. Small left pleural effusion. 3. Nodular ground-glass infiltrates throughout  the right lung with consolidation in the right lower lung. Patchy nodular infiltrates in the left lower lung. Changes could be due to pneumonia, metastasis, or aspiration. This is progressing since previous PET-CT. 4. Moderately prominent lymph nodes in the mediastinum, nonspecific but could indicate metastatic disease. 5. Prominent soft tissue density replaces the fat planes at the celiac axis. This could indicate lymphadenopathy or possibly postoperative change. Similar appearance to previous PET-CT. Electronically Signed   By: Lucienne Capers M.D.   On: 05/22/2020 00:15        Scheduled Meds: . aspirin EC  81 mg Oral Daily  . atorvastatin  40 mg Oral Daily  . clopidogrel  75 mg Oral Daily  . enoxaparin (LOVENOX) injection  40 mg Subcutaneous Q24H  . levothyroxine  50 mcg Oral Daily   Continuous Infusions: . ceFEPime (MAXIPIME) IV Stopped (05/22/20 0742)  . lactated ringers 100 mL/hr at 05/22/20 0230  . norepinephrine (LEVOPHED) Adult infusion    . norepinephrine (LEVOPHED) Adult infusion    . vancomycin       LOS: 0 days    Time spent: 25 minutes    Edwin Dada, MD [facility] 05/22/2020, 7:43 AM     Please page though [place] or Epic secure chat:  For password, contact charge nurse

## 2020-05-22 NOTE — H&P (Addendum)
History and Physical    Corey Perry FBP:[id number] DOB: 11/22/1945 DOA: 05/21/2020  PCP: Corey Roys, DO   Patient coming from: Home  Chief Complaint: Fever, cough, shortness of breath  HPI: Corey Perry is a 74 y.o. male with medical history significant for esophageal cancer currently undergoing chemotherapy, A. fib, CAD on Plavix, hypertension, hyperlipidemia, kidney stones who presents for evaluation of cough.  Patient was diagnosed with pneumonia 3 weeks ago by his primary care doctor was initially treated with a Z-Pak.  His symptoms persisted and he was then changed to Augmentin and doxycycline but he continues to have cough with thick green sputum production and fevers.  He states he has not gotten better despite the antibiotics.  Has had severely diminished appetite, decreased oral intake, generalized weakness.  Had a fever of 101.5 F at home for which he took Tylenol before coming into the hospital.  Is also complaining of intermittent right sided sharp chest pain which is mild with coughing.  He has had shortness of breath with exertion.  He denies vomiting or diarrhea. Ports he does not have any coughing or gagging when he swallows.  Patient reports he has been tested multiple times for Covid 19 infection over the last few months and is always been negative.  He has not had the Covid vaccine as his oncologist told him he should not receive it while he is undergoing chemotherapy.  ED Course: Corey Perry is found to have a low blood pressure in the emergency room and was given 2 L of LR IV fluid hydration.  He is found to have multifocal pneumonia on chest x-ray.  CT angiography of the chest was obtained with him being hypoxic and requiring 2 L of oxygen by nasal cannula.  No PE is identified.  Patient has multifocal pneumonia and possibly metastasis in his left lung base.  Patient initially given Rocephin and azithromycin in the emergency room for antibiotic coverage.   Hospital service is asked to admit for further management  Review of Systems:  General: Reports fever and chills.  Reports generalized weakness.  Denies weight loss, night sweats.  Denies dizziness.  Reports decreased appetite HENT: Denies head trauma, headache, denies change in hearing, tinnitus.  Denies nasal congestion or bleeding.  Denies sore throat, sores in mouth.  Denies difficulty swallowing Eyes: Denies blurry vision, pain in eye, drainage.  Denies discoloration of eyes. Neck: Denies pain.  Denies swelling.  Denies pain with movement. Cardiovascular: Denies chest pain, palpitations.  Denies edema.  Denies orthopnea Respiratory: Reports shortness of breath with cough.  Has thick green sputum production.  Has intermittent wheezing.  Gastrointestinal: Denies abdominal pain, swelling.  Denies nausea, vomiting, diarrhea.  Denies melena.  Denies hematemesis. Musculoskeletal: Denies limitation of movement.  Denies deformity or swelling.  Denies pain.  Denies arthralgias or myalgias. Genitourinary: Denies pelvic pain.  Denies urinary frequency or hesitancy.  Denies dysuria.  Skin: Denies rash.  Denies petechiae, purpura, ecchymosis. Neurological: Denies headache.  Denies syncope.  Denies seizure activity.  Denies weakness or paresthesia.  Denies slurred speech, drooping face.  Denies visual change. Psychiatric: Denies depression, anxiety.  Denies suicidal thoughts or ideation.  Denies hallucinations.  Past Medical History:  Diagnosis Date  . Abnormal white blood cell (WBC) count    seeing Corey Perry at [facility] on 10/25  . Anemia   . Atrial fibrillation ([place])   . BPH (benign prostatic hypertrophy)   . Bradycardia   . CAD (coronary artery  disease)   . Dysrhythmia    bradycardia - sees Corey Perry  . Esophageal cancer ([place]) 05/2016  . GERD (gastroesophageal reflux disease)   . History of kidney stones   . Hyperlipidemia   . Hypertension   . Hypothyroidism   . Kidney stone   .  Myocardial infarction ([place]) 2005  . Pneumonia   . S/P angioplasty with stent 2005   x 4 vessels  . Sleep apnea    mild-NO CPAP  . Spinal stenosis    lumbar    Past Surgical History:  Procedure Laterality Date  . BACK SURGERY    . CARDIAC CATHETERIZATION Left 03/12/2016   Procedure: Left Heart Cath and Coronary Angiography;  Surgeon: Corey David, MD;  Location: [place] [facility];  Service: Cardiovascular;  Laterality: Left;  . CARDIAC CATHETERIZATION N/A 03/12/2016   Procedure: Coronary Stent Intervention;  Surgeon: Corey Kida, MD;  Location: [place] [facility];  Service: Cardiovascular;  Laterality: N/A;  . COLONOSCOPY WITH PROPOFOL N/A 07/13/2015   Procedure: COLONOSCOPY WITH PROPOFOL;  Surgeon: Corey Lame, MD;  Location: [place];  Service: Endoscopy;  Laterality: N/A;  . CORONARY ANGIOPLASTY  10/05 and 12/05   4 DES placed  . CYSTOSCOPY W/ RETROGRADES Left 06/15/2019   Procedure: CYSTOSCOPY WITH RETROGRADE PYELOGRAM;  Surgeon: Corey Sons, MD;  Location: [facility];  Service: Urology;  Laterality: Left;  . CYSTOSCOPY/URETEROSCOPY/HOLMIUM LASER/STENT PLACEMENT Left 06/15/2019   Procedure: CYSTOSCOPY/URETEROSCOPY/HOLMIUM LASER/STENT PLACEMENT;  Surgeon: Corey Sons, MD;  Location: [facility];  Service: Urology;  Laterality: Left;  . ESOPHAGECTOMY  10/07/2016   @ [facility]  . ESOPHAGOGASTRODUODENOSCOPY (EGD) WITH PROPOFOL N/A 06/14/2016   Procedure: ESOPHAGOGASTRODUODENOSCOPY (EGD) WITH PROPOFOL;  Surgeon: Corey Sails, MD;  Location: [facility];  Service: Endoscopy;  Laterality: N/A;  . HERNIA REPAIR Bilateral    x3  . INGUINAL HERNIA REPAIR Right 08/28/2017   Large PerFix plug, recurrent hernia;  Surgeon: Corey Bellow, MD;  Location: [facility];  Service: General;  Laterality: Right;  recurrent  . KIDNEY STONE SURGERY  07/15/2019  . POLYPECTOMY  07/13/2015   Procedure: POLYPECTOMY;  Surgeon: Corey Lame, MD;  Location: [place];   Service: Endoscopy;;  . PORT-A-CATH REMOVAL N/A 01/16/2017   Procedure: REMOVAL PORT-A-CATH;  Surgeon: Corey Ree, MD;  Location: [facility];  Service: General;  Laterality: N/A;  . PORTACATH PLACEMENT N/A 07/08/2016   Procedure: INSERTION PORT-A-CATH;  Surgeon: Corey Ree, MD;  Location: [facility];  Service: General;  Laterality: N/A;  . PORTACATH PLACEMENT Right 08/30/2019   Procedure: INSERTION PORT-A-CATH;  Surgeon: Corey Ree, MD;  Location: [facility];  Service: General;  Laterality: Right;    Social History  reports that he quit smoking about 15 years ago. His smoking use included cigarettes. He has a 40.00 pack-year smoking history. He has quit using smokeless tobacco.  His smokeless tobacco use included chew. He reports current alcohol use of about 1.0 standard drink of alcohol per week. He reports that he does not use drugs.  No Known Allergies  Family History  Problem Relation Age of Onset  . Hypertension Mother   . Dementia Mother   . Heart disease Father   . Hypertension Father   . Kidney disease Father   . Hypertension Brother   . Heart disease Brother   . Diabetes Son   . Hypertension Son   . Hyperlipidemia Son   . Hyperlipidemia Daughter   . Hypertension Daughter  Prior to Admission medications   Medication Sig Start Date End Date Taking? Authorizing Provider  acetaminophen (TYLENOL) 500 MG tablet Take 500 mg by mouth every 6 (six) hours as needed for mild pain or moderate pain.    [provider]  aspirin 81 MG tablet Take 81 mg by mouth daily.     [provider]  atorvastatin (LIPITOR) 40 MG tablet Take 1 tablet (40 mg total) by mouth daily. 05/01/20   Park Liter P, DO  clopidogrel (PLAVIX) 75 MG tablet Take 1 tablet (75 mg total) by mouth daily. 05/01/20   Johnson, Megan P, DO  diphenoxylate-atropine (LOMOTIL) 2.5-0.025 MG tablet Take 2 tablets by mouth 4 (four) times daily as needed for diarrhea or loose stools. Patient not taking:  Reported on 04/28/2020 03/08/20   [name], MD  levothyroxine (SYNTHROID) 50 MCG tablet Take 1 tablet (50 mcg total) by mouth daily. 02/15/20   Johnson, Megan P, DO  lisinopril (ZESTRIL) 2.5 MG tablet Take 1 tablet (2.5 mg total) by mouth every morning. 05/01/20   Johnson, Megan P, DO  loperamide (IMODIUM A-D) 2 MG tablet Take 2 mg by mouth 4 (four) times daily as needed for diarrhea or loose stools.  Patient not taking: Reported on 05/01/2020    [provider]  metoprolol succinate (TOPROL-XL) 25 MG 24 hr tablet Take 1 tablet (25 mg total) by mouth daily. 05/01/20   Park Liter P, DO  multivitamin-iron-minerals-folic acid (CENTRUM) chewable tablet Chew 1 tablet by mouth daily.    [provider]  nitroGLYCERIN (NITROSTAT) 0.4 MG SL tablet Place 1 tablet under the tongue every 5 (five) minutes as needed for chest pain.  Patient not taking: Reported on 05/01/2020 03/08/16   [provider]  ondansetron (ZOFRAN) 8 MG tablet Take 1 tablet (8 mg total) by mouth 2 (two) times daily as needed for refractory nausea / vomiting. Start on day 3 after chemotherapy. 08/26/19   [name], MD  pantoprazole (PROTONIX) 20 MG tablet TAKE ONE TABLET BY MOUTH ONCE DAILY 03/11/20   Park Liter P, DO  prochlorperazine (COMPAZINE) 10 MG tablet Take 1 tablet (10 mg total) by mouth every 6 (six) hours as needed (Nausea or vomiting). 08/26/19   [name], MD    Physical Exam: Vitals:   05/21/20 2234 05/21/20 2235 05/21/20 2241 05/22/20 0003  BP: (!) 69/39 (!) 73/41 (!) 75/48 (!) 88/45  Pulse: 64  63 (!) 54  Resp:   (!) 22 18  Temp:      TempSrc:      SpO2: 94%  96% 100%  Weight:      Height:        Constitutional: NAD, calm, comfortable Vitals:   05/21/20 2234 05/21/20 2235 05/21/20 2241 05/22/20 0003  BP: (!) 69/39 (!) 73/41 (!) 75/48 (!) 88/45  Pulse: 64  63 (!) 54  Resp:   (!) 22 18  Temp:      TempSrc:      SpO2: 94%  96% 100%  Weight:        Height:       General: WDWN, Alert and oriented x3.  Eyes: EOMI, PERRL, lids and conjunctivae normal.  Sclera nonicteric HENT:  [place]/AT, external ears normal.  Nares patent without epistasis.  Mucous membranes are moist. Posterior pharynx clear of any exudate or lesions Neck: Soft, normal range of motion, supple, no masses, no thyromegaly. Trachea midline Respiratory: Diminished breath sounds with bibasilar rhonchi.  Diffuse expiratory wheezing,  no crackles. Normal respiratory effort. No accessory muscle use.  Cardiovascular: Irregular rhythm with normal rate, no murmurs / rubs / gallops. No extremity edema. 2+ pedal pulses.  Abdomen: Soft, no tenderness, nondistended, no rebound or guarding.  No masses palpated. No hepatosplenomegaly. Bowel sounds normoactive Musculoskeletal: FROM.  Has clubbing of digits.  No cyanosis. No joint deformity upper and lower extremities. no contractures. Normal muscle tone.  Skin: Warm, dry, intact no rashes, lesions, ulcers. No induration Neurologic: CN 2-12 grossly intact.  Normal speech.  Sensation intact, Strength 5/5 in all extremities.   Psychiatric: Normal judgment and insight.  Normal mood.    Labs on Admission: I have personally reviewed following labs and imaging studies  CBC: Recent Labs  Lab 05/21/20 2121  WBC 9.5  NEUTROABS 8.2*  HGB 9.2*  HCT 29.3*  MCV 98.3  PLT 253    Basic Metabolic Panel: Recent Labs  Lab 05/21/20 2121  NA 136  K 5.1  CL 100  CO2 29  GLUCOSE 111*  BUN 24*  CREATININE 1.55*  CALCIUM 8.7*    GFR: Estimated Creatinine Clearance: 34 mL/min (A) (by C-G formula based on SCr of 1.55 mg/dL (H)).  Liver Function Tests: Recent Labs  Lab 05/21/20 2121  AST 31  ALT 30  ALKPHOS 59  BILITOT 0.7  PROT 8.2*  ALBUMIN 2.9*    Urine analysis:    Component Value Date/Time   COLORURINE STRAW (A) 10/18/2018 0647   APPEARANCEUR Cloudy (A) 06/23/2019 0822   LABSPEC 1.010 10/18/2018 0647   PHURINE 5.0 10/18/2018  0647   GLUCOSEU Trace (A) 06/23/2019 0822   HGBUR SMALL (A) 10/18/2018 0647   BILIRUBINUR Negative 06/23/2019 0822   KETONESUR NEGATIVE 10/18/2018 0647   PROTEINUR 3+ (A) 06/23/2019 0822   PROTEINUR NEGATIVE 10/18/2018 0647   NITRITE Positive (A) 06/23/2019 0822   NITRITE NEGATIVE 10/18/2018 0647   LEUKOCYTESUR 3+ (A) 06/23/2019 0822    Radiological Exams on Admission: DG Chest 2 View  Result Date: 05/21/2020 CLINICAL DATA:  Suspected sepsis. Productive cough and fever. Recently treated for pneumonia with persistent cough. Patient with history of esophageal cancer. EXAM: CHEST - 2 VIEW COMPARISON:  Chest radiograph 04/21/2020.  PET CT 04/27/2020 FINDINGS: Right chest port in place. Air-fluid level within distal esophagus as seen on prior PET. Chronic blunting of the right costophrenic angle, however suspect increase in pleural effusion. Progressive patchy opacities in the superior segment of the right upper lobe from prior exam. Slight increase in right basilar opacities. Left lung is clear. Stable heart size and mediastinal contours. No pneumothorax. IMPRESSION: 1. Progressive patchy opacities in the right upper and lower lobes from prior exam. Findings suspicious for persistent and worsening pneumonia, consider aspiration. Increased right pleural effusion. 2. Air-fluid level within the distal esophagus as seen on prior PET. Electronically Signed   By: Keith Rake M.D.   On: 05/21/2020 22:21   CT Angio Chest PE W and/or Wo Contrast  Result Date: 05/22/2020 CLINICAL DATA:  Weakness and productive cough. Diagnosed with pneumonia a couple of weeks ago. Decreased oxygen saturation. Decreased blood pressure. EXAM: CT ANGIOGRAPHY CHEST WITH CONTRAST TECHNIQUE: Multidetector CT imaging of the chest was performed using the standard protocol during bolus administration of intravenous contrast. Multiplanar CT image reconstructions and MIPs were obtained to evaluate the vascular anatomy. CONTRAST:  12mL  OMNIPAQUE IOHEXOL 350 MG/ML SOLN COMPARISON:  CT chest 06/26/2016.  PET-CT 04/27/2020 FINDINGS: Cardiovascular: Satisfactory opacification of the pulmonary arteries to the segmental level. No evidence of  pulmonary embolism. Normal heart size. No pericardial effusion. Coronary artery calcifications. Mediastinum/Nodes: There appears to been esophageal resection with gastric pull up procedure. Moderately prominent lymph nodes in the mediastinum, nonspecific. Pretracheal lymph nodes measure about 1.6 cm maximal dimension. Lungs/Pleura: Small left pleural effusion. Nodular infiltrates throughout the right lung with focal consolidation in the right lower lung. Patchy nodular infiltrates in the left lower lung. Mild interstitial thickening. Changes could be due to pneumonia, metastasis, or aspiration. Upper Abdomen: Prominent soft tissue density replaces the fat planes at the celiac axis. This could indicate lymphadenopathy or possibly postoperative change. Musculoskeletal: No chest wall abnormality. No acute or significant osseous findings. Review of the MIP images confirms the above findings. IMPRESSION: 1. No evidence of significant pulmonary embolus. 2. Small left pleural effusion. 3. Nodular ground-glass infiltrates throughout the right lung with consolidation in the right lower lung. Patchy nodular infiltrates in the left lower lung. Changes could be due to pneumonia, metastasis, or aspiration. This is progressing since previous PET-CT. 4. Moderately prominent lymph nodes in the mediastinum, nonspecific but could indicate metastatic disease. 5. Prominent soft tissue density replaces the fat planes at the celiac axis. This could indicate lymphadenopathy or possibly postoperative change. Similar appearance to previous PET-CT. Electronically Signed   By: Lucienne Capers M.D.   On: 05/22/2020 00:15    EKG: EKG was obtained to the emergency room as ordered upon admission and is pending  Assessment/Plan Principal  Problem:   Multifocal pneumonia Mr. Centrella has multifocal pneumonia on chest x-ray and CT scan.  He has been on Augmentin and Zosyn for the past week and a half according to patient and his daughter. Culture obtained to the emergency room will be monitored. Patient is started on cefepime and vancomycin for empiric antibiotic coverage of multifocal pneumonia in the setting of being on antibiotics as an outpatient without resolution of pneumonia. Supplemental oxygen to keep O2 sat between 92 to 96% Incentive spirometer every hour while awake.  Pulmonary toilet  Active Problems:   Sepsis [facility]) Patient meets sepsis criteria with multifocal pneumonia with low blood pressure, fever.  Treatment as outlined    Hypotension Patient with low blood pressure since presenting to the emergency room.  He has been given 2 L of IV fluid with LR in the emergency room which meets 30 ml/kg of IV fluid hydration.  Continues to have low blood pressure with a MAP of 59.  Started on Levophed for pressor support    Esophageal cancer [facility]) Patient with known esophageal cancer being followed by oncology.  Currently undergoing chemotherapy with oncology Patient with possible metastasis in the lungs.  Will need further evaluation with oncology after treatment of the pneumonia    CKD (chronic kidney disease) stage 3, GFR 30-59 ml/min Stable chronic kidney disease.  Will monitor electrolytes and renal function with labs    Acute respiratory failure with hypoxia ([place]) Home oxygen to keep O2 sat between 92-96%     DVT prophylaxis: Padua score elevated. Lovenox for DVT prophylaxis Code Status:   Full code Family Communication:  Diagnosis and plan discussed with patient and his daughter who is at bedside.  Questions answered.  Patient and family verbalized understanding agrees plan.  Further recommendation follow as clinically indicated Disposition Plan:   Patient is from:  Home  Anticipated DC  to:  Home  Anticipated DC date:  Anticipate greater than 2 midnight stay in the hospital to treat acute medical condition  Anticipated DC barriers: No barriers to discharge  identified at this time   Admission status:  Inpatient  Severity of Illness: The appropriate patient status for this patient is INPATIENT. Inpatient status is judged to be reasonable and necessary in order to provide the required intensity of service to ensure the patient's safety. [name] presenting symptoms, physical exam findings, and initial radiographic and laboratory data in the context of their chronic comorbidities is felt to place them at high risk for further clinical deterioration. Furthermore, it is not anticipated that the patient will be medically stable for discharge from the hospital within 2 midnights of admission. The following factors support the patient status of inpatient.    * I certify that at the point of admission it is my clinical judgment that the patient will require inpatient hospital care spanning beyond 2 midnights from the point of admission due to high intensity of service, high risk for further deterioration and high frequency of surveillance required.Yevonne Aline Ahmari Garton MD [facility]  How to contact the [facility] Attending or Consulting provider [place] or covering provider during after hours [place], for this patient?   1. Check the care team in [facility] and look for a) attending/consulting [facility] provider listed and b) the [facility] team listed 2. Log into [email or web address] and use [place]'s universal password to access. If you do not have the password, please contact the hospital operator. 3. Locate the [facility] provider you are looking for under [facility] and page to a number that you can be directly reached. 4. If you still have difficulty reaching the provider, please page the [facility] (Director on Call) for the Hospitalists listed on amion for assistance.  05/22/2020, 1:15 AM

## 2020-05-22 NOTE — Progress Notes (Signed)
Pt states daughter Armando Reichert is his DPOA for medical decisions but he can't pull up her phone number on his phone. "Last time I was here, I left the documents with you guys."

## 2020-05-23 DIAGNOSIS — J189 Pneumonia, unspecified organism: Secondary | ICD-10-CM

## 2020-05-23 DIAGNOSIS — E440 Moderate protein-calorie malnutrition: Secondary | ICD-10-CM | POA: Insufficient documentation

## 2020-05-23 LAB — BASIC METABOLIC PANEL
Anion gap: 7 (ref 5–15)
BUN: 24 mg/dL — ABNORMAL HIGH (ref 8–23)
CO2: 27 mmol/L (ref 22–32)
Calcium: 8.2 mg/dL — ABNORMAL LOW (ref 8.9–10.3)
Chloride: 95 mmol/L — ABNORMAL LOW (ref 98–111)
Creatinine, Ser: 1.35 mg/dL — ABNORMAL HIGH (ref 0.61–1.24)
GFR calc Af Amer: 60 mL/min — ABNORMAL LOW (ref >60)
GFR calc non Af Amer: 52 mL/min — ABNORMAL LOW (ref >60)
Glucose, Bld: 90 mg/dL (ref 70–99)
Potassium: 4.5 mmol/L (ref 3.5–5.1)
Sodium: 129 mmol/L — ABNORMAL LOW (ref 135–145)

## 2020-05-23 LAB — CBC
HCT: 23.8 % — ABNORMAL LOW (ref 39.0–52.0)
Hemoglobin: 7.3 g/dL — ABNORMAL LOW (ref 13.0–17.0)
MCH: 30.7 pg (ref 26.0–34.0)
MCHC: 30.7 g/dL (ref 30.0–36.0)
MCV: 100 fL (ref 80.0–100.0)
Platelets: 144 10*3/uL — ABNORMAL LOW (ref 150–400)
RBC: 2.38 MIL/uL — ABNORMAL LOW (ref 4.22–5.81)
RDW: 18.6 % — ABNORMAL HIGH (ref 11.5–15.5)
WBC: 8.4 10*3/uL (ref 4.0–10.5)
nRBC: 0 % (ref 0.0–0.2)

## 2020-05-23 LAB — MRSA PCR SCREENING: MRSA by PCR: NEGATIVE

## 2020-05-23 MED ORDER — ENSURE ENLIVE PO LIQD
237.0000 mL | Freq: Two times a day (BID) | ORAL | Status: DC
  Administered 2020-05-24: 237 mL via ORAL

## 2020-05-23 NOTE — Consult Note (Signed)
[facility]  [phone] [phone]  ID: [name] OB: 1946-08-16  MR#: [id number]  BJS#:[id number]  Patient Care Team: Valerie Roys, DO as PCP - General (Family Medicine) [name], MD as PCP - Family Medicine (Family Medicine) [name], MD as Consulting Physician (Oncology) [name], MD as Referring Physician [name], MD as Consulting Physician (Cardiology)  CHIEF COMPLAINT: History of recurrent esophageal cancer, pneumonia.  INTERVAL HISTORY: Patient is a 74 year old male with a history of recurrent esophageal cancer who last received treatment with FOLFOX on March 31, 2020.  He is currently under active surveillance.  Over the weekend, he was having fevers and increased weakness and fatigue.  Upon evaluation in the emergency room patient was noted to have pneumonia and admitted for IV antibiotics and further evaluation.  He feels significantly improved since his admission.  He has no neurologic complaints.  He denies any further fevers.  He has a good appetite and denies weight loss.  He has no chest pain, shortness of breath, cough, or hemoptysis.  He denies any nausea, vomiting, constipation, or diarrhea.  He has no urinary complaints.  Patient feels nearly back to his baseline and offers no further specific complaints today.  REVIEW OF SYSTEMS:   Review of Systems  Constitutional: Positive for malaise/fatigue. Negative for fever and weight loss.  Respiratory: Negative.  Negative for cough and shortness of breath.   Cardiovascular: Negative.  Negative for chest pain and leg swelling.  Gastrointestinal: Negative.  Negative for abdominal pain.  Genitourinary: Negative.  Negative for dysuria.  Musculoskeletal: Negative for back pain.  Skin: Negative.  Negative for rash.  Neurological: Positive for weakness. Negative for dizziness, focal weakness and headaches.  Psychiatric/Behavioral: Negative.  The  patient is not nervous/anxious.     As per HPI. Otherwise, a complete review of systems is negative.  PAST MEDICAL HISTORY: Past Medical History:  Diagnosis Date  . Abnormal white blood cell (WBC) count    seeing Dr. [name] at [facility] on 10/25  . Anemia   . Atrial fibrillation ([place])   . BPH (benign prostatic hypertrophy)   . Bradycardia   . CAD (coronary artery disease)   . Dysrhythmia    bradycardia - sees Dr. Humphrey Rolls  . Esophageal cancer ([place]) 05/2016  . GERD (gastroesophageal reflux disease)   . History of kidney stones   . Hyperlipidemia   . Hypertension   . Hypothyroidism   . Kidney stone   . Myocardial infarction ([place]) 2005  . Pneumonia   . S/P angioplasty with stent 2005   x 4 vessels  . Sleep apnea    mild-NO CPAP  . Spinal stenosis    lumbar    PAST SURGICAL HISTORY: Past Surgical History:  Procedure Laterality Date  . BACK SURGERY    . CARDIAC CATHETERIZATION Left 03/12/2016   Procedure: Left Heart Cath and Coronary Angiography;  Surgeon: [name], MD;  Location: [facility];  Service: Cardiovascular;  Laterality: Left;  . CARDIAC CATHETERIZATION N/A 03/12/2016   Procedure: Coronary Stent Intervention;  Surgeon: Yolonda Kida, MD;  Location: [facility];  Service: Cardiovascular;  Laterality: N/A;  . COLONOSCOPY WITH PROPOFOL N/A 07/13/2015   Procedure: COLONOSCOPY WITH PROPOFOL;  Surgeon: Lucilla Lame, MD;  Location: [facility];  Service: Endoscopy;  Laterality: N/A;  . CORONARY ANGIOPLASTY  10/05 and 12/05   4 DES placed  . CYSTOSCOPY W/ RETROGRADES Left 06/15/2019  Procedure: CYSTOSCOPY WITH RETROGRADE PYELOGRAM;  Surgeon: Abbie Sons, MD;  Location: [facility];  Service: Urology;  Laterality: Left;  . CYSTOSCOPY/URETEROSCOPY/HOLMIUM LASER/STENT PLACEMENT Left 06/15/2019   Procedure: CYSTOSCOPY/URETEROSCOPY/HOLMIUM LASER/STENT PLACEMENT;  Surgeon: Abbie Sons, MD;  Location: [facility];  Service: Urology;  Laterality:  Left;  . ESOPHAGECTOMY  10/07/2016   @ [facility]  . ESOPHAGOGASTRODUODENOSCOPY (EGD) WITH PROPOFOL N/A 06/14/2016   Procedure: ESOPHAGOGASTRODUODENOSCOPY (EGD) WITH PROPOFOL;  Surgeon: [name], MD;  Location: [facility];  Service: Endoscopy;  Laterality: N/A;  . HERNIA REPAIR Bilateral    x3  . INGUINAL HERNIA REPAIR Right 08/28/2017   Large PerFix plug, recurrent hernia;  Surgeon: Robert Bellow, MD;  Location: [facility];  Service: General;  Laterality: Right;  recurrent  . KIDNEY STONE SURGERY  07/15/2019  . POLYPECTOMY  07/13/2015   Procedure: POLYPECTOMY;  Surgeon: Lucilla Lame, MD;  Location: [place];  Service: Endoscopy;;  . PORT-A-CATH REMOVAL N/A 01/16/2017   Procedure: REMOVAL PORT-A-CATH;  Surgeon: Olean Ree, MD;  Location: [facility];  Service: General;  Laterality: N/A;  . PORTACATH PLACEMENT N/A 07/08/2016   Procedure: INSERTION PORT-A-CATH;  Surgeon: Olean Ree, MD;  Location: [facility];  Service: General;  Laterality: N/A;  . PORTACATH PLACEMENT Right 08/30/2019   Procedure: INSERTION PORT-A-CATH;  Surgeon: Olean Ree, MD;  Location: [facility];  Service: General;  Laterality: Right;    FAMILY HISTORY: Family History  Problem Relation Age of Onset  . Hypertension Mother   . Dementia Mother   . Heart disease Father   . Hypertension Father   . Kidney disease Father   . Hypertension Brother   . Heart disease Brother   . Diabetes Son   . Hypertension Son   . Hyperlipidemia Son   . Hyperlipidemia Daughter   . Hypertension Daughter     ADVANCED DIRECTIVES (Y/N):  @ADVDIR @  HEALTH MAINTENANCE: Social History   Tobacco Use  . Smoking status: Former Smoker    Packs/day: 1.00    Years: 40.00    Pack years: 40.00    Types: Cigarettes    Quit date: 07/16/2004    Years since quitting: 15.8  . Smokeless tobacco: Former Systems developer    Types: Secondary school teacher  . Vaping Use: Never used  Substance Use Topics  . Alcohol use: Yes    Alcohol/week: 1.0  standard drink    Types: 1 Cans of beer per week  . Drug use: No     Colonoscopy:  PAP:  Bone density:  Lipid panel:  No Known Allergies  Current Facility-Administered Medications  Medication Dose Route Frequency Provider Last Rate Last Admin  . 0.9 %  sodium chloride infusion   Intravenous PRN Edwin Dada, MD 10 mL/hr at 05/22/20 2242 250 mL at 05/22/20 2242  . acetaminophen (TYLENOL) tablet 650 mg  650 mg Oral Q6H PRN Chotiner, Yevonne Aline, MD      . aspirin EC tablet 81 mg  81 mg Oral Daily Chotiner, Yevonne Aline, MD   81 mg at 05/23/20 1045  . atorvastatin (LIPITOR) tablet 40 mg  40 mg Oral Daily Chotiner, Yevonne Aline, MD   40 mg at 05/23/20 1045  . azithromycin (ZITHROMAX) tablet 500 mg  500 mg Oral Daily Danford, Suann Larry, MD   500 mg at 05/23/20 1045  . ceFEPIme (MAXIPIME) 2 g in sodium chloride 0.9 % 100 mL IVPB  2 g Intravenous Q12H Dallie Piles, RPH 200 mL/hr at 05/23/20 1052 2  g at 05/23/20 1052  . Chlorhexidine Gluconate Cloth 2 % PADS 6 each  6 each Topical Daily Danford, Suann Larry, MD   6 each at 05/23/20 1047  . clopidogrel (PLAVIX) tablet 75 mg  75 mg Oral Daily Chotiner, Yevonne Aline, MD   75 mg at 05/23/20 1045  . enoxaparin (LOVENOX) injection 40 mg  40 mg Subcutaneous Q24H Chotiner, Yevonne Aline, MD   40 mg at 05/23/20 1046  . levothyroxine (SYNTHROID) tablet 50 mcg  50 mcg Oral Daily Chotiner, Yevonne Aline, MD   50 mcg at 05/23/20 6237    OBJECTIVE: Vitals:   05/23/20 0222 05/23/20 0828  BP: (!) 122/57 109/64  Pulse: 62 (!) 54  Resp: 18 18  Temp: 99.4 F (37.4 C) 98.9 F (37.2 C)  SpO2: 95% 96%     Body mass index is 18.46 kg/m.    ECOG FS:1 - Symptomatic but completely ambulatory  General: Thin, no acute distress. Eyes: Pink conjunctiva, anicteric sclera. HEENT: Normocephalic, moist mucous membranes. Lungs: No audible wheezing or coughing. Heart: Regular rate and rhythm. Abdomen: Soft, nontender, no obvious distention. Musculoskeletal: No  edema, cyanosis, or clubbing. Neuro: Alert, answering all questions appropriately. Cranial nerves grossly intact. Skin: No rashes or petechiae noted. Psych: Normal affect.   LAB RESULTS:  Lab Results  Component Value Date   NA 129 (L) 05/23/2020   K 4.5 05/23/2020   CL 95 (L) 05/23/2020   CO2 27 05/23/2020   GLUCOSE 90 05/23/2020   BUN 24 (H) 05/23/2020   CREATININE 1.35 (H) 05/23/2020   CALCIUM 8.2 (L) 05/23/2020   PROT 8.2 (H) 05/21/2020   ALBUMIN 2.9 (L) 05/21/2020   AST 31 05/21/2020   ALT 30 05/21/2020   ALKPHOS 59 05/21/2020   BILITOT 0.7 05/21/2020   GFRNONAA 52 (L) 05/23/2020   GFRAA 60 (L) 05/23/2020    Lab Results  Component Value Date   WBC 8.4 05/23/2020   NEUTROABS 8.2 (H) 05/21/2020   HGB 7.3 (L) 05/23/2020   HCT 23.8 (L) 05/23/2020   MCV 100.0 05/23/2020   PLT 144 (L) 05/23/2020     STUDIES: DG Chest 2 View  Result Date: 05/21/2020 CLINICAL DATA:  Suspected sepsis. Productive cough and fever. Recently treated for pneumonia with persistent cough. Patient with history of esophageal cancer. EXAM: CHEST - 2 VIEW COMPARISON:  Chest radiograph 04/21/2020.  PET CT 04/27/2020 FINDINGS: Right chest port in place. Air-fluid level within distal esophagus as seen on prior PET. Chronic blunting of the right costophrenic angle, however suspect increase in pleural effusion. Progressive patchy opacities in the superior segment of the right upper lobe from prior exam. Slight increase in right basilar opacities. Left lung is clear. Stable heart size and mediastinal contours. No pneumothorax. IMPRESSION: 1. Progressive patchy opacities in the right upper and lower lobes from prior exam. Findings suspicious for persistent and worsening pneumonia, consider aspiration. Increased right pleural effusion. 2. Air-fluid level within the distal esophagus as seen on prior PET. Electronically Signed   By: Keith Rake M.D.   On: 05/21/2020 22:21   CT Angio Chest PE W and/or Wo  Contrast  Result Date: 05/22/2020 CLINICAL DATA:  Weakness and productive cough. Diagnosed with pneumonia a couple of weeks ago. Decreased oxygen saturation. Decreased blood pressure. EXAM: CT ANGIOGRAPHY CHEST WITH CONTRAST TECHNIQUE: Multidetector CT imaging of the chest was performed using the standard protocol during bolus administration of intravenous contrast. Multiplanar CT image reconstructions and MIPs were obtained to evaluate the vascular anatomy. CONTRAST:  54mL OMNIPAQUE IOHEXOL 350 MG/ML SOLN COMPARISON:  CT chest 06/26/2016.  PET-CT 04/27/2020 FINDINGS: Cardiovascular: Satisfactory opacification of the pulmonary arteries to the segmental level. No evidence of pulmonary embolism. Normal heart size. No pericardial effusion. Coronary artery calcifications. Mediastinum/Nodes: There appears to been esophageal resection with gastric pull up procedure. Moderately prominent lymph nodes in the mediastinum, nonspecific. Pretracheal lymph nodes measure about 1.6 cm maximal dimension. Lungs/Pleura: Small left pleural effusion. Nodular infiltrates throughout the right lung with focal consolidation in the right lower lung. Patchy nodular infiltrates in the left lower lung. Mild interstitial thickening. Changes could be due to pneumonia, metastasis, or aspiration. Upper Abdomen: Prominent soft tissue density replaces the fat planes at the celiac axis. This could indicate lymphadenopathy or possibly postoperative change. Musculoskeletal: No chest wall abnormality. No acute or significant osseous findings. Review of the MIP images confirms the above findings. IMPRESSION: 1. No evidence of significant pulmonary embolus. 2. Small left pleural effusion. 3. Nodular ground-glass infiltrates throughout the right lung with consolidation in the right lower lung. Patchy nodular infiltrates in the left lower lung. Changes could be due to pneumonia, metastasis, or aspiration. This is progressing since previous PET-CT. 4.  Moderately prominent lymph nodes in the mediastinum, nonspecific but could indicate metastatic disease. 5. Prominent soft tissue density replaces the fat planes at the celiac axis. This could indicate lymphadenopathy or possibly postoperative change. Similar appearance to previous PET-CT. Electronically Signed   By: Lucienne Capers M.D.   On: 05/22/2020 00:15   NM PET Image Restag (PS) Skull Base To Thigh  Result Date: 04/27/2020 CLINICAL DATA:  Subsequent treatment strategy for lower esophageal cancer. Prior neoadjuvant chemo radiotherapy and esophagectomy in 2018, subsequent recurrence. The patient finished a course of 12 rounds of FOLFOX chemotherapy 4 weeks ago and was recently treated for right-sided pneumonia. EXAM: NUCLEAR MEDICINE PET SKULL BASE TO THIGH TECHNIQUE: 6.7 mCi F-18 FDG was injected intravenously. Full-ring PET imaging was performed from the skull base to thigh after the radiotracer. CT data was obtained and used for attenuation correction and anatomic localization. Fasting blood glucose: 127 mg/dl COMPARISON:  Multiple exams, including 11/09/2019 FINDINGS: Mediastinal blood pool activity: SUV max 1.8 Liver activity: SUV max 2.8 NECK: No significant abnormal hypermetabolic activity in this region. Incidental CT findings: Bilateral common carotid atherosclerotic calcification CHEST: New patchy and nodular airspace opacities the right lower lobe to a lesser extent in the right middle lobe demonstrate accentuated metabolic activity. Example, the right lower lobe airspace opacity has a representative maximum SUV of 3.9 and a 1.7 by 1.2 cm nodular focus of opacity medially in the right middle lobe 4.5. There is also reticulonodular opacity in the right upper lobe for example on image 99/3 in a pattern characteristic for atypical infection, with faint accentuated activity. Given the patient's history, the constellation of findings favors pneumonia, although follow up imaging for surveillance is  likely warranted. Prior similar opacities in different locations in the right upper lobe and left lower lobe on the prior PET-CT from March 2021 have intervally resolved. Incidental CT findings: Esophagectomy with gastric pull-through. Right Port-A-Cath tip: SVC. Coronary, aortic arch, and branch vessel atherosclerotic vascular disease. Scattered reticulonodular opacities in the left upper lobe are likely infectious. Trace right pleural effusion, probably exudative. ABDOMEN/PELVIS: As before, there is indistinctly marginated soft tissue density surrounding the celiac trunk causing poor visibility of the diaphragmatic crura and ill definition of tissue planes along the adjacent pancreas. In the vicinity of the presumed duodenal bulb just below the  diaphragm, and in the area of this soft tissue density, there is mildly accentuated activity with maximum SUV of 4.6, previously about 3.6 in the same region. Some of the indistinctness of tissue planes could be related to therapy if radiation therapy is been utilized. Clearly, low-grade residual tumor in this vicinity is a distinct possibility. No significant change in contour of the indistinct soft tissue in this region. No appreciable hypermetabolic hepatic lesion or other worrisome hypermetabolic lesion identified in the abdomen/pelvis. Incidental CT findings: Aortoiliac atherosclerotic vascular disease. Nonobstructive left nephrolithiasis. Infrarenal abdominal aortic ectasia at 2.9 cm diameter. Hazy subcutaneous and mesenteric stranding. Sigmoid diverticulosis. SKELETON: No significant abnormal hypermetabolic activity in this region. Incidental CT findings: Mild chronic findings of avascular necrosis in both femoral heads. Grade 1 degenerative anterolisthesis of L4 on L5 with posterior decompression at this level. IMPRESSION: 1. Roughly similar appearance of indistinctly marginated soft tissue density along the celiac trunk and anterior portion of the upper abdominal  aorta, also obscuring the margin of the adjacent pancreas. Some of this may well be treatment related, but a component of a low-grade residual malignancy is difficult to exclude. Maximum SUV in this vicinity is 4.6 (previously 3.6) although this measurement may include the duodenal bulb and thus could include some physiologic activity. I do not observe significant progression of the soft tissue density. 2. Some of the prior regions of infection in the lungs shown on the PET-CT from March 2021 have resolved. However, there are new regions of multilobar pneumonia and/or atypical infection especially concentrated in the right lower lobe, with moderate associated metabolic activity. Surveillance imaging may be warranted to ensure complete resolution. 3. No findings of hepatic metastatic disease. 4. Other imaging findings of potential clinical significance: Esophagectomy with gastric pull-through. Aortic Atherosclerosis (ICD10-I70.0). Coronary atherosclerosis. Trace right pleural effusion. Chronic bilateral femoral head avascular necrosis. Nonobstructive left nephrolithiasis. Ectatic abdominal aorta. Sigmoid diverticulosis. Electronically Signed   By: Van Clines M.D.   On: 04/27/2020 15:51    ASSESSMENT: History of recurrent esophageal cancer, pneumonia.  PLAN:    1.  Recurrent adenocarcinoma of the esophagus: Patient currently is under active surveillance and last received treatment with FOLFOX on March 31, 2020.  PET scan results from April 27, 2020 revealed no significant findings of recurrent or progressive disease.  More recent CT scan results from May 22, 2020 likely reveal pneumonia with reactive lymph nodes, but this is unclear.  Can consider CT scan in 4 to 6 weeks to assess interval change.  No intervention is needed from an oncology standpoint.  Patient has been instructed to keep his previously scheduled follow-up appointments. 2.  Pneumonia: CT scan results as above.  Agree with current  antibiotics.  Patient will also likely require oral antibiotics upon discharge. 3.  Anemia: Patient's hemoglobin has decreased to 7.3.  Consider transfusion if hemoglobin falls below 7.0. 4.  Thrombocytopenia: Mild, monitor. 5.  Renal insufficiency: Patient's creatinine is slightly above his baseline.  Continue IV fluids and encourage p.o. intake. 6.  Disposition: Okay to discharge from oncology standpoint.  Appreciate consult, call with questions.   [name], MD   05/23/2020 12:58 PM

## 2020-05-23 NOTE — Evaluation (Signed)
Physical Therapy Evaluation Patient Details Name: Corey BROUILLET MRN: [id number] DOB: 10-Mar-1946 Today's Date: 05/23/2020   History of Present Illness  Pt is a 74 y.o. male presenting to hospital 9/5 with productive cough and fever x3 weeks.  Pt admitted with multifocal PNA, sepsis, and anemia of chemo.  Currently undergoing chemo with oncology d/t esophageal CA.  PMH includes h/o esophageal CA s/p chemo, a-fib, CAD, htn, kidney stones, a-fib, MI, h/o back surgery; imaging showing mild chronic avascular necrosis in both femoral heads.  Clinical Impression  Prior to hospital admission, pt was independent; lives with his wife in 1 level home (3 STE B railings).  Currently pt is modified independent with bed mobility; independent with transfers; independent with ambulation 220 feet (no AD); and modified independent ambulating 8 steps with railings.  Pt steady and safe with functional mobility during sessions activities.  Pt reporting no pain during session.  No acute PT needs identified; will sign off.  Please re-consult PT if pt's status changes and acute PT needs are identified.    Follow Up Recommendations No PT follow up    Equipment Recommendations  None recommended by PT    Recommendations for Other Services       Precautions / Restrictions Precautions Precautions: None Precaution Comments: R chest port Restrictions Weight Bearing Restrictions: No      Mobility  Bed Mobility Overal bed mobility: Modified Independent             General bed mobility comments: Semi-supine to/from sitting without any noted difficulties  Transfers Overall transfer level: Independent Equipment used: None             General transfer comment: x2 trials standing from bed (strong and steady)  Ambulation/Gait Ambulation/Gait assistance: Independent Gait Distance (Feet): 220 Feet Assistive device: None Gait Pattern/deviations: WFL(Within Functional Limits) Gait velocity: appearing  normal   General Gait Details: step through gait pattern; steady  Stairs Stairs: Yes Stairs assistance: Modified independent (Device/Increase time) Stair Management: Two rails;Step to pattern;Forwards;Backwards Number of Stairs:  (4 steps x2 trials) General stair comments: ascended steps forward and descended steps backwards; pt steady and safe  Wheelchair Mobility    Modified Rankin (Stroke Patients Only)       Balance Overall balance assessment: No apparent balance deficits (not formally assessed) (No loss of balance with ambulation, stairs, or washing hands at sink)                                           Pertinent Vitals/Pain Pain Assessment: No/denies pain  Vitals (HR and O2 on room air) stable and WFL throughout treatment session.    Home Living Family/patient expects to be discharged to:: Private residence Living Arrangements: Spouse/significant other Available Help at Discharge: Family Type of Home: Mobile home Home Access: Stairs to enter Entrance Stairs-Rails: Right;Left;Can reach both Entrance Stairs-Number of Steps: 3 Home Layout: One level        Prior Function Level of Independence: Independent               Hand Dominance        Extremity/Trunk Assessment   Upper Extremity Assessment Upper Extremity Assessment: Overall WFL for tasks assessed    Lower Extremity Assessment Lower Extremity Assessment: Overall WFL for tasks assessed    Cervical / Trunk Assessment Cervical / Trunk Assessment: Normal  Communication   Communication: No difficulties  Cognition Arousal/Alertness: Awake/alert Behavior During Therapy: WFL for tasks assessed/performed Overall Cognitive Status: Within Functional Limits for tasks assessed                                        General Comments   Nursing cleared pt for participation in physical therapy.  Pt agreeable to PT session.    Exercises     Assessment/Plan     PT Assessment Patent does not need any further PT services  PT Problem List         PT Treatment Interventions      PT Goals (Current goals can be found in the Care Plan section)  Acute Rehab PT Goals Patient Stated Goal: to go home PT Goal Formulation: With patient Time For Goal Achievement: 06/06/20 Potential to Achieve Goals: Good    Frequency     Barriers to discharge        Co-evaluation               AM-PAC PT "6 Clicks" Mobility  Outcome Measure Help needed turning from your back to your side while in a flat bed without using bedrails?: None Help needed moving from lying on your back to sitting on the side of a flat bed without using bedrails?: None Help needed moving to and from a bed to a chair (including a wheelchair)?: None Help needed standing up from a chair using your arms (e.g., wheelchair or bedside chair)?: None Help needed to walk in hospital room?: None Help needed climbing 3-5 steps with a railing? : None 6 Click Score: 24    End of Session Equipment Utilized During Treatment: Gait belt Activity Tolerance: Patient tolerated treatment well Patient left: in bed;with call bell/phone within reach Nurse Communication: Mobility status;Other (comment) (Nurse reports pt independent in room and no chair/bed alarm needed) PT Visit Diagnosis: Muscle weakness (generalized) (M62.81)    Time: 4481-8563 PT Time Calculation (min) (ACUTE ONLY): 15 min   Charges:   PT Evaluation $PT Eval Low Complexity: 1 Low         Zyra Parrillo, PT 05/23/20, 3:02 PM

## 2020-05-23 NOTE — Progress Notes (Signed)
PROGRESS NOTE    Corey Perry  TML:[id number] DOB: December 08, 1945 DOA: 05/21/2020 PCP: Valerie Roys, DO      Brief Narrative:  Corey Perry is a 74 y.o. M with Afib not on AC, CAD, hypothyroidism, OSA not on CPAP and esophageal cancer in 2017 s/p chemoRad, recurrent in 2021 recently completed FOLFOX who presented with recurrent pneumonia for 3 weeks.  Patient first developed fever, cough, SOB 3 weeks ago, started on  Augmentin/doxycycline without improvement.  Progressed to diminished appetite, decreased oral intake and generalized weakness.  On day of admission, fever to 101.20F at home, so presented to ER.  In the ER, CTA chest showed no PE but multifocal pneumonia vs metastasis in the left lung base.  Started on Rocephin and azithromycin.       Assessment & Plan:  Multifocal pneumonia Sepsis Patient admitted with sepsis criteria, multifocal pneumonia, low pressure pressure, fever.   -Continue cefepime -Stop vancomycin given MRSA negative -Follow sputum culture    Anemia of chemo Hemoglobin stable at 7.3 g/dL, no clinical beeding -Transfusion threshold 7 g/dL  Esophageal cancer  Patient with known esophageal cancer being followed by oncology.  Currently undergoing chemotherapy with oncology  Patient with possible metastasis in the lungs.  Will need further evaluation with oncology after treatment of the pneumonia -Appreciate oncology consultation  CKD IIIa   Acute respiratory failure with hypoxia SpO2 < 90% and tachypneic. Resolved.  Atrial fibrillation, paroxysmal In sinus rhythm. Not on anticoagulation.  Coronary disease, secondary prevention Blood pressure still soft -Continue aspirin, Plavix, atorvastatin -Hold metoprolol and lisinopril due to hypotension  Hypothyroidism -Continue levothyroxine  OSA Not on CPAP  Hyponatremia Stable, asymptomatic         Disposition: Status is: Inpatient  Remains inpatient appropriate  because:Inpatient level of care appropriate due to severity of illness   Dispo: The patient is from: Home              Anticipated d/c is to: Home              Anticipated d/c date is: 1 day              Patient currently is not medically stable to d/c.    The patient was admitted with sepsis and acute hypoxic respiratory failure due to pneumonia. Given severity of symptoms of his failure of appropriate outpatient therapy, will continue cefepime as we await sputum culture, likely tomorrow sensitivities will result and we can discharge home.          MDM: The below labs and imaging reports reviewed and summarized above.  Medication management as above.    DVT prophylaxis: enoxaparin (LOVENOX) injection 40 mg Start: 05/22/20 1000  Code Status: FULl Family Communication:      Consultants:   Oncology   Antimicrobials:    Vancomycin 9/5 >> 9/7  Cefepime 9/5 >>  Culture data:   9/5 blood culture x2 no growth to date  9/6 sputum culture-ID pending          Subjective: Patient continues to feel better, still weak and tired. Mentating well. No chest pain.        Objective: Vitals:   05/22/20 2220 05/23/20 0222 05/23/20 0828 05/23/20 1554  BP: 114/63 (!) 122/57 109/64 108/67  Pulse: 78 62 (!) 54 (!) 59  Resp: 18 18 18 18   Temp: 99.2 F (37.3 C) 99.4 F (37.4 C) 98.9 F (37.2 C) 98.3 F (36.8 C)  TempSrc: Oral Oral Oral Oral  SpO2: 97% 95% 96% 97%  Weight:      Height:        Intake/Output Summary (Last 24 hours) at 05/23/2020 1651 Last data filed at 05/23/2020 1606 Gross per 24 hour  Intake 1223.03 ml  Output --  Net 1223.03 ml   Filed Weights   05/21/20 2105  Weight: 56.7 kg    Examination: BP 108/67 (BP Location: Left Arm)   Pulse (!) 59   Temp 98.3 F (36.8 C) (Oral)   Resp 18   Ht 5\' 9"  (1.753 m)   Wt 56.7 kg   SpO2 97%   BMI 18.46 kg/m   General: Elderly adult male, lying in bed, no obvious distress.  Responds  appropriately to questions.  Eye contact, dress and hygiene appropriate. HEENT: Corneas clear, conjunctivae and sclerae normal without injection or icterus, lids and lashes normal.  Visual tracking smooth.  OP moist without erythema, exudates, cobblestoning, or ulcers.  No airway deformities.  Neck supple.   Cardiac: Tachycardic, regular, no murmurs, no lower extremity edema Respiratory: Normal respiratory rate and rhythm, crackles at the right base, diminished in the right, otherwise normal, no wheezing Abdomen: BS present.  No TTP or rebound all quadrants.  No masses or organomegaly.  No scars.  No striae, dilated veins, rashes, or lesions.  No ascites, distension. Extremities: No deformities/injuries.  5/5 grip strength and upper extremity flexion/extension, symmetrically.  Extremities are warm and well-perfused. Neuro: Sensorium intact.  Speech is fluent.  Naming is grossly intact, and the patient's recall, recent and remote, as well as general fund of knowledge seem within normal limits.  Muscle tone normal, without fasciculations.  Moves all extremities equally and with normal coordination but with generalized weakness.  Attention span and concentration are within normal limits.  Psych: Mood " good", full range of affect.  Normal rate and rhythm of speech.  Thought content appropriate, and thought process linear.  No SI/HI, aural or visual hallucinations or delusions. Attention and concentration are normal.       Data Reviewed: I have personally reviewed following labs and imaging studies:  CBC: Recent Labs  Lab 05/21/20 2121 05/22/20 0333 05/23/20 0352  WBC 9.5 8.5 8.4  NEUTROABS 8.2*  --   --   HGB 9.2* 7.2* 7.3*  HCT 29.3* 23.8* 23.8*  MCV 98.3 101.7* 100.0  PLT 195 141* 841*   Basic Metabolic Panel: Recent Labs  Lab 05/21/20 2121 05/22/20 0333 05/23/20 0352  NA 136 131* 129*  K 5.1 5.0 4.5  CL 100 98 95*  CO2 29 27 27   GLUCOSE 111* 127* 90  BUN 24* 26* 24*  CREATININE  1.55* 1.51*  1.53* 1.35*  CALCIUM 8.7* 8.1* 8.2*   GFR: Estimated Creatinine Clearance: 39.1 mL/min (A) (by C-G formula based on SCr of 1.35 mg/dL (H)). Liver Function Tests: Recent Labs  Lab 05/21/20 2121  AST 31  ALT 30  ALKPHOS 59  BILITOT 0.7  PROT 8.2*  ALBUMIN 2.9*   No results for input(s): LIPASE, AMYLASE in the last 168 hours. No results for input(s): AMMONIA in the last 168 hours. Coagulation Profile: Recent Labs  Lab 05/21/20 2121  INR 1.2   Cardiac Enzymes: No results for input(s): CKTOTAL, CKMB, CKMBINDEX, TROPONINI in the last 168 hours. BNP (last 3 results) No results for input(s): PROBNP in the last 8760 hours. HbA1C: No results for input(s): HGBA1C in the last 72 hours. CBG: No results for input(s): GLUCAP in the last 168 hours. Lipid Profile: No  results for input(s): CHOL, HDL, LDLCALC, TRIG, CHOLHDL, LDLDIRECT in the last 72 hours. Thyroid Function Tests: No results for input(s): TSH, T4TOTAL, FREET4, T3FREE, THYROIDAB in the last 72 hours. Anemia Panel: No results for input(s): VITAMINB12, FOLATE, FERRITIN, TIBC, IRON, RETICCTPCT in the last 72 hours. Urine analysis:    Component Value Date/Time   COLORURINE YELLOW (A) 05/21/2020 2251   APPEARANCEUR CLEAR (A) 05/21/2020 2251   APPEARANCEUR Cloudy (A) 06/23/2019 0822   LABSPEC >1.046 (H) 05/21/2020 2251   PHURINE 5.0 05/21/2020 2251   GLUCOSEU NEGATIVE 05/21/2020 2251   HGBUR NEGATIVE 05/21/2020 2251   BILIRUBINUR NEGATIVE 05/21/2020 2251   BILIRUBINUR Negative 06/23/2019 0822   KETONESUR NEGATIVE 05/21/2020 2251   PROTEINUR NEGATIVE 05/21/2020 2251   NITRITE NEGATIVE 05/21/2020 2251   LEUKOCYTESUR NEGATIVE 05/21/2020 2251   Sepsis Labs: @LABRCNTIP (procalcitonin:4,lacticacidven:4)  ) Recent Results (from the past 240 hour(s))  Culture, blood (Routine x 2)     Status: None (Preliminary result)   Collection Time: 05/21/20  9:22 PM   Specimen: BLOOD  Result Value Ref Range Status    Specimen Description BLOOD BLOOD RIGHT FOREARM  Final   Special Requests   Final    BOTTLES DRAWN AEROBIC AND ANAEROBIC Blood Culture results may not be optimal due to an excessive volume of blood received in culture bottles   Culture   Final    NO GROWTH 2 DAYS Performed at [facility], [address], [place], [place] [address]    Report Status PENDING  Incomplete  SARS Coronavirus 2 by RT PCR (hospital order, performed in [place] hospital lab) Nasopharyngeal Nasopharyngeal Swab     Status: None   Collection Time: 05/21/20 10:51 PM   Specimen: Nasopharyngeal Swab  Result Value Ref Range Status   SARS Coronavirus 2 NEGATIVE NEGATIVE Final    Comment: (NOTE) SARS-CoV-2 target nucleic acids are NOT DETECTED.  The SARS-CoV-2 RNA is generally detectable in upper and lower respiratory specimens during the acute phase of infection. The lowest concentration of SARS-CoV-2 viral copies this assay can detect is 250 copies / mL. A negative result does not preclude SARS-CoV-2 infection and should not be used as the sole basis for treatment or other patient management decisions.  A negative result may occur with improper specimen collection / handling, submission of specimen other than nasopharyngeal swab, presence of viral mutation(s) within the areas targeted by this assay, and inadequate number of viral copies (<250 copies / mL). A negative result must be combined with clinical observations, patient history, and epidemiological information.  Fact Sheet for Patients:   StrictlyIdeas.no  Fact Sheet for Healthcare Providers: BankingDealers.co.za  This test is not yet approved or  cleared by the [place] FDA and has been authorized for detection and/or diagnosis of SARS-CoV-2 by FDA under an Emergency Use Authorization (EUA).  This EUA will remain in effect (meaning this test can be used) for the duration of the COVID-19  declaration under Section 564(b)(1) of the Act, 21 U.S.C. section 360bbb-3(b)(1), unless the authorization is terminated or revoked sooner.  Performed at [facility], [address] [place], [place] [address]   Expectorated sputum assessment w rflx to resp cult     Status: None   Collection Time: 05/22/20  3:15 PM   Specimen: Expectorated Sputum  Result Value Ref Range Status   Specimen Description EXPECTORATED SPUTUM  Final   Special Requests NONE  Final   Sputum evaluation   Final    THIS SPECIMEN IS ACCEPTABLE  FOR SPUTUM CULTURE Performed at [facility], [address] [place], [place] [address]    Report Status 05/22/2020 FINAL  Final  Culture, respiratory     Status: None (Preliminary result)   Collection Time: 05/22/20  3:15 PM  Result Value Ref Range Status   Specimen Description   Final    EXPECTORATED SPUTUM Performed at [facility], [address] [place], [place] [address]    Special Requests   Final    NONE Reflexed from [id number] Performed at [facility], [address] [place], [place] [address]    Gram Stain   Final    RARE WBC PRESENT, PREDOMINANTLY PMN RARE GRAM POSITIVE COCCI RARE GRAM NEGATIVE RODS    Culture   Final    CULTURE REINCUBATED FOR BETTER GROWTH Performed at [facility], [place] [address] [place], [place] [address]    Report Status PENDING  Incomplete  MRSA PCR Screening     Status: None   Collection Time: 05/23/20 12:00 AM   Specimen: Nasal Mucosa; Nasopharyngeal  Result Value Ref Range Status   MRSA by PCR NEGATIVE NEGATIVE Final    Comment:        The GeneXpert MRSA Assay (FDA approved for NASAL specimens only), is one component of a comprehensive MRSA colonization surveillance program. It is not intended to diagnose MRSA infection nor to guide or monitor treatment for MRSA infections. Performed at [facility], [address] [place], [place] [address]   Culture,  blood (Routine X 2) w Reflex to ID Panel     Status: None (Preliminary result)   Collection Time: 05/23/20  3:52 AM   Specimen: BLOOD  Result Value Ref Range Status   Specimen Description BLOOD RIGHT [facility]  Final   Special Requests   Final    BOTTLES DRAWN AEROBIC AND ANAEROBIC Blood Culture results may not be optimal due to an excessive volume of blood received in culture bottles   Culture   Final    NO GROWTH < 12 HOURS Performed at [facility], [address] [place], [place] [address]    Report Status PENDING  Incomplete         Radiology Studies: DG Chest 2 View  Result Date: 05/21/2020 CLINICAL DATA:  Suspected sepsis. Productive cough and fever. Recently treated for pneumonia with persistent cough. Patient with history of esophageal cancer. EXAM: CHEST - 2 VIEW COMPARISON:  Chest radiograph 04/21/2020.  PET CT 04/27/2020 FINDINGS: Right chest port in place. Air-fluid level within distal esophagus as seen on prior PET. Chronic blunting of the right costophrenic angle, however suspect increase in pleural effusion. Progressive patchy opacities in the superior segment of the right upper lobe from prior exam. Slight increase in right basilar opacities. Left lung is clear. Stable heart size and mediastinal contours. No pneumothorax. IMPRESSION: 1. Progressive patchy opacities in the right upper and lower lobes from prior exam. Findings suspicious for persistent and worsening pneumonia, consider aspiration. Increased right pleural effusion. 2. Air-fluid level within the distal esophagus as seen on prior PET. Electronically Signed   By: Keith Rake M.D.   On: 05/21/2020 22:21   CT Angio Chest PE W and/or Wo Contrast  Result Date: 05/22/2020 CLINICAL DATA:  Weakness and productive cough. Diagnosed with pneumonia a couple of weeks ago. Decreased oxygen saturation. Decreased blood pressure. EXAM: CT ANGIOGRAPHY CHEST WITH CONTRAST TECHNIQUE: Multidetector CT imaging of the chest was  performed using the standard protocol during bolus administration  of intravenous contrast. Multiplanar CT image reconstructions and MIPs were obtained to evaluate the vascular anatomy. CONTRAST:  38mL OMNIPAQUE IOHEXOL 350 MG/ML SOLN COMPARISON:  CT chest 06/26/2016.  PET-CT 04/27/2020 FINDINGS: Cardiovascular: Satisfactory opacification of the pulmonary arteries to the segmental level. No evidence of pulmonary embolism. Normal heart size. No pericardial effusion. Coronary artery calcifications. Mediastinum/Nodes: There appears to been esophageal resection with gastric pull up procedure. Moderately prominent lymph nodes in the mediastinum, nonspecific. Pretracheal lymph nodes measure about 1.6 cm maximal dimension. Lungs/Pleura: Small left pleural effusion. Nodular infiltrates throughout the right lung with focal consolidation in the right lower lung. Patchy nodular infiltrates in the left lower lung. Mild interstitial thickening. Changes could be due to pneumonia, metastasis, or aspiration. Upper Abdomen: Prominent soft tissue density replaces the fat planes at the celiac axis. This could indicate lymphadenopathy or possibly postoperative change. Musculoskeletal: No chest wall abnormality. No acute or significant osseous findings. Review of the MIP images confirms the above findings. IMPRESSION: 1. No evidence of significant pulmonary embolus. 2. Small left pleural effusion. 3. Nodular ground-glass infiltrates throughout the right lung with consolidation in the right lower lung. Patchy nodular infiltrates in the left lower lung. Changes could be due to pneumonia, metastasis, or aspiration. This is progressing since previous PET-CT. 4. Moderately prominent lymph nodes in the mediastinum, nonspecific but could indicate metastatic disease. 5. Prominent soft tissue density replaces the fat planes at the celiac axis. This could indicate lymphadenopathy or possibly postoperative change. Similar appearance to previous  PET-CT. Electronically Signed   By: Lucienne Capers M.D.   On: 05/22/2020 00:15        Scheduled Meds: . aspirin EC  81 mg Oral Daily  . atorvastatin  40 mg Oral Daily  . azithromycin  500 mg Oral Daily  . Chlorhexidine Gluconate Cloth  6 each Topical Daily  . clopidogrel  75 mg Oral Daily  . enoxaparin (LOVENOX) injection  40 mg Subcutaneous Q24H  . feeding supplement (ENSURE ENLIVE)  237 mL Oral BID BM  . levothyroxine  50 mcg Oral Daily   Continuous Infusions: . sodium chloride 250 mL (05/22/20 2242)  . ceFEPime (MAXIPIME) IV 2 g (05/23/20 1052)     LOS: 1 day    Time spent: 25 minutes    Edwin Dada, MD [facility] 05/23/2020, 4:51 PM     Please page though [place] or Epic secure chat:  For password, contact charge nurse

## 2020-05-23 NOTE — Progress Notes (Signed)
Initial Nutrition Assessment  DOCUMENTATION CODES:   Underweight, Non-severe (moderate) malnutrition in context of chronic illness  INTERVENTION:  Ensure Enlive po BID, each supplement provides 350 kcal and 20 grams of protein  Education provided   NUTRITION DIAGNOSIS:   Moderate Malnutrition related to chronic illness (recurrent esophageal cancer) as evidenced by moderate fat depletion, severe fat depletion, moderate muscle depletion, severe muscle depletion, percent weight loss.    GOAL:   Patient will meet greater than or equal to 90% of their needs   MONITOR:   PO intake, Weight trends, Labs, Supplement acceptance, I & O's  REASON FOR ASSESSMENT:   Malnutrition Screening Tool    ASSESSMENT:  RD working remotely.  74 year old male with history of recurrent esophageal cancer s/p chemo and radiation, Afib, CAD on Plavix, HTN, HLD, kidney stones, GERD who was recently diagnosed with pneumonia ~3 weeks ago by PCP presented with persistent symptoms s/p antibiotics admitted with multifocal pneumonia.  Patient awake, sitting up in bed this morning and reports feeling much better today, hoping he will be able to go out of town this weekend as planned. Patient endorses good appetite at home, recalls 3 meals/day and drinks half of an Ensure Enlive in the morning and the other half later for tolerance, enjoys the chocolate and mocha flavors. Per chart, weights have trended down ~13 lbs (9.5%) in the last 4 months; significant. Patient recalls weighing 220 lbs prior to diagnosis in 2017, reports 125-130 lb more recently. RD educated on adequate nutrition needs to weight maintenance, discussed weighs to maximize calories and protein, and encouraged incorporating high protein snacks as well as drinking 2 nutrient dense supplements daily.  Medications reviewed and include: Zithromax IVPB: Maxipime  Labs: Na 129 (L), BUN 24 (H), Cr 1.35 (H), Hgb 7.3 (L), HCT 23.8 (L)   NUTRITION -  FOCUSED PHYSICAL EXAM:    Most Recent Value  Orbital Region Moderate depletion  Upper Arm Region Severe depletion  Thoracic and Lumbar Region Moderate depletion  Buccal Region Moderate depletion  Temple Region Moderate depletion  Clavicle Bone Region Severe depletion  Clavicle and Acromion Bone Region Moderate depletion  Scapular Bone Region Unable to assess  Dorsal Hand Mild depletion  Patellar Region Severe depletion  Anterior Thigh Region Moderate depletion  Posterior Calf Region Moderate depletion  Edema (RD Assessment) None  Hair Reviewed  Eyes Reviewed  Mouth Reviewed  Skin Reviewed  Nails Reviewed       Diet Order:   Diet Order            Diet Heart Room service appropriate? Yes; Fluid consistency: Thin; Fluid restriction: Other (see comments)  Diet effective now                 EDUCATION NEEDS:   Education needs have been addressed  Skin:  Skin Assessment: Reviewed RN Assessment  Last BM:  9/7 per pt  Height:   Ht Readings from Last 1 Encounters:  05/21/20 5\' 9"  (1.753 m)    Weight:   Wt Readings from Last 1 Encounters:  05/21/20 56.7 kg    Ideal Body Weight:  72.7 kg  BMI:  Body mass index is 18.46 kg/m.  Estimated Nutritional Needs:   Kcal:  1800-2000  Protein:  90-100  Fluid:  >/= 1.8 L/day   Lajuan Lines, RD, LDN Clinical Nutrition After Hours/Weekend Pager # in [place]

## 2020-05-24 DIAGNOSIS — A419 Sepsis, unspecified organism: Principal | ICD-10-CM

## 2020-05-24 DIAGNOSIS — N1831 Chronic kidney disease, stage 3a: Secondary | ICD-10-CM

## 2020-05-24 DIAGNOSIS — E440 Moderate protein-calorie malnutrition: Secondary | ICD-10-CM

## 2020-05-24 DIAGNOSIS — J9601 Acute respiratory failure with hypoxia: Secondary | ICD-10-CM

## 2020-05-24 DIAGNOSIS — C155 Malignant neoplasm of lower third of esophagus: Secondary | ICD-10-CM

## 2020-05-24 DIAGNOSIS — R6521 Severe sepsis with septic shock: Secondary | ICD-10-CM

## 2020-05-24 LAB — BASIC METABOLIC PANEL
Anion gap: 8 (ref 5–15)
BUN: 19 mg/dL (ref 8–23)
CO2: 27 mmol/L (ref 22–32)
Calcium: 8.2 mg/dL — ABNORMAL LOW (ref 8.9–10.3)
Chloride: 96 mmol/L — ABNORMAL LOW (ref 98–111)
Creatinine, Ser: 1.21 mg/dL (ref 0.61–1.24)
GFR calc Af Amer: >60 mL/min (ref >60)
GFR calc non Af Amer: 59 mL/min — ABNORMAL LOW (ref >60)
Glucose, Bld: 95 mg/dL (ref 70–99)
Potassium: 3.9 mmol/L (ref 3.5–5.1)
Sodium: 131 mmol/L — ABNORMAL LOW (ref 135–145)

## 2020-05-24 LAB — CBC
HCT: 23.9 % — ABNORMAL LOW (ref 39.0–52.0)
Hemoglobin: 7.4 g/dL — ABNORMAL LOW (ref 13.0–17.0)
MCH: 30.8 pg (ref 26.0–34.0)
MCHC: 31 g/dL (ref 30.0–36.0)
MCV: 99.6 fL (ref 80.0–100.0)
Platelets: 150 10*3/uL (ref 150–400)
RBC: 2.4 MIL/uL — ABNORMAL LOW (ref 4.22–5.81)
RDW: 18.5 % — ABNORMAL HIGH (ref 11.5–15.5)
WBC: 5.7 10*3/uL (ref 4.0–10.5)
nRBC: 0 % (ref 0.0–0.2)

## 2020-05-24 MED ORDER — CEFDINIR 300 MG PO CAPS
300.0000 mg | ORAL_CAPSULE | Freq: Two times a day (BID) | ORAL | Status: DC
  Filled 2020-05-24: qty 1

## 2020-05-24 MED ORDER — AZITHROMYCIN 500 MG PO TABS: 500.0000 mg | ORAL_TABLET | Freq: Every day | ORAL | Status: DC

## 2020-05-24 MED ORDER — ENSURE ENLIVE PO LIQD: 237.0000 mL | Freq: Two times a day (BID) | ORAL | 12 refills | Status: AC

## 2020-05-24 MED ORDER — AZITHROMYCIN 500 MG PO TABS: 500.0000 mg | ORAL_TABLET | Freq: Every day | ORAL | 0 refills | Status: AC

## 2020-05-24 MED ORDER — CEFDINIR 300 MG PO CAPS: 300.0000 mg | ORAL_CAPSULE | Freq: Two times a day (BID) | ORAL | 0 refills | Status: AC

## 2020-05-24 MED ORDER — LISINOPRIL 5 MG PO TABS: 5.0000 mg | ORAL_TABLET | Freq: Every day | ORAL | 0 refills | Status: DC

## 2020-05-24 MED ORDER — METOPROLOL SUCCINATE ER 25 MG PO TB24: 12.5000 mg | ORAL_TABLET | Freq: Every day | ORAL | 1 refills | Status: AC

## 2020-05-24 NOTE — Discharge Summary (Signed)
Physician Discharge Summary  Corey Perry WCB:[id number] DOB: 1946/08/31 DOA: 05/21/2020  PCP: Valerie Roys, DO  Admit date: 05/21/2020 Discharge date: 05/24/2020  Admitted From: Home Disposition:  Home  Recommendations for Outpatient Follow-up:  1. Follow up with PCP in 1-2 weeks 2. Follow up with Medical Oncology Dr. [name] within 1-2 weeks 3. Please obtain CMP/CBC, Mag, Phos in one week 4. Please follow up on the following pending results: FINAL BLOOD CX  Home Health: No Equipment/Devices: None    Discharge Condition: Stable  CODE STATUS: FULL CODE Diet recommendation: Heart Healthy Diet  Brief/Interim Summary: The patient is a 74 year old Caucasian male with a past medical history significant for but not limited to atrial fibrillation not on any anticoagulation, CAD, hypothyroidism, obstructive sleep apnea not on CPAP, and history of esophageal cancer in 2017 status post chemotherapy and radiation with recurrence in 2021 who recently completed FOLFOX who presents with recurrent pneumonia for the last 3 weeks. Patient first developed fever, cough, shortness breath 3 weeks ago and was started on Augmentin and doxycycline in outpatient setting without improvement. He progressed to diminished appetite and decreased oral intake and had generalized weakness. On the day of admission he had a fever 101.5 at home and took acetaminophen and he presented to the ER. In the ER CT of the chest was done which showed pulmonary embolus but did show multifocal pneumonia versus metastasis in the left lung base. He was initially started on Rocephin and azithromycin. On admission he was found to be in septic shock as he was given fluid resuscitation without response and was then subsequently started on Levophed short-term. Levophed was subsequently weaned off and he improved his blood pressures. He is treated with IV cefepime and this was changed to p.o. cefdinir at discharge and vancomycin was stopped  given his MRCP negative. His sepsis physiology improved his blood pressure stabilized and he significantly improved. He ambulated without desaturating. Medical oncology was consulted during this hospitalization and cleared the patient for discharge we will follow him up in outpatient setting. Currently stable to be discharged at this time as he is significantly improved from admission and because his blood pressures are stabilized. His antihypertensives were reduced and he will need to follow-up with PCP and medical oncology in outpatient setting.  Discharge Diagnoses:  Principal Problem:   Septic shock ([place]) Active Problems:   Esophageal cancer (HCC)   Multifocal pneumonia   Hypotension   CKD (chronic kidney disease) stage 3, GFR 30-59 ml/min   Acute respiratory failure with hypoxia (HCC)   Malnutrition of moderate degree   Severe sepsis with septic shock (HCC)  Multifocal pneumonia Septic Shock, poA -Patient met criteria for septic shock from pneumonia on admission given SIRS criteria (bandemia & resp rate) plus had hypotension that was not initially relieved with appropriate fluid resuscitation -Levophed was considered and ordered was weaned off and he subsequently improved -He was initiated on IV antibiotics with IV cefepime and Comycin was stopped given a negative MRSA. -Sputum culture was consistent with normal respiratory flora and showed no staph aureus or Pseudomonas seen -Currently blood cultures are no growth to date -He was transitioned to oral antibiotics and completed his azithromycin dose tomorrow and will also finish his course of antibiotics with cefdinir in the outpatient setting  Anemia of Chemotherapy -Hemoglobin stable at 7.4 g/dL, no clinical beeding -Transfusion threshold 7 g/dL -We will need follow-up in the outpatient setting with his oncologist as a hemoglobin/hematocrit is relatively stable at 7.4/23.9 -Currently  no active signs and symptoms of bleeding Repeat  CBC within 1 week  Esophageal cancer  -Patient with known esophageal cancer being followed by oncology.  -Currently undergoing chemotherapy with oncology -Patient with possible metastasis in the lungs. Will need further evaluation with oncology after treatment of the pneumonia -Appreciate oncology consultation  AKI on CKD IIIa -BUN/creatinine is improved and has gone from 26-1 0.53 on admission is now 19/1.21 Continue monitor and trend in the outpatient setting and avoid nephrotoxic medications, contrast dyes, hypotension -We have reduce his lisinopril dose to 5 mg p.o. daily -Repeat CMP in the outpatient setting within 1 to 2 weeks   Acute Respiratory Failure with Hypoxia -SpO2 < 90% and tachypneic. Resolved. -He ambulated without issue and did not require any supplemental oxygen via nasal cannula at discharge  Atrial Fibrillation, paroxysmal -Currently in in sinus rhythm. Not on anticoagulation.  Coronary disease, secondary prevention -Blood pressure still soft -Continue aspirin, Plavix, atorvastatin -Hold metoprolol and lisinopril due to hypotension and we have reduced the dosing and have instructed the patient to resume tomorrow  Hypothyroidism -Continue levothyroxine  OSA -Not on CPAP  Hyponatremia -Stable, asymptomatic -Sodium is now 131 -Continue monitor and trend and repeat in outpatient setting  Underweight/nonsevere malnutrition in the context of chronic illness -Estimated body mass index is 18.46 kg/m as calculated from the following:   Height as of this encounter: 5' 9"  (1.753 m).   Weight as of this encounter: 56.7 kg. -Dietitian was consulted for further evaluation recommendations -Dietitian recommending Ensure Enlive p.o. twice daily  Thrombocytopenia  -Improved and patient's platelet count has gone from 144,000 is 150,000 -Continue monitor and trend repeat in outpatient setting  Discharge Instructions  Discharge Instructions    ([place]) Call MD:  Anytime you have any of the following symptoms: 1) 3 pound weight gain in 24 hours or 5 pounds in 1 week 2) shortness of breath, with or without a dry hacking cough 3) swelling in the hands, feet or stomach 4) if you have to sleep on extra pillows at night in order to breathe.   Complete by: As directed    Call MD for:  difficulty breathing, headache or visual disturbances   Complete by: As directed    Call MD for:  extreme fatigue   Complete by: As directed    Call MD for:  hives   Complete by: As directed    Call MD for:  persistant dizziness or light-headedness   Complete by: As directed    Call MD for:  persistant nausea and vomiting   Complete by: As directed    Call MD for:  redness, tenderness, or signs of infection (pain, swelling, redness, odor or green/yellow discharge around incision site)   Complete by: As directed    Call MD for:  severe uncontrolled pain   Complete by: As directed    Call MD for:  temperature >100.4   Complete by: As directed    Diet - low sodium heart healthy   Complete by: As directed    Discharge instructions   Complete by: As directed    You were cared for by a hospitalist during your hospital stay. If you have any questions about your discharge medications or the care you received while you were in the hospital after you are discharged, you can call the unit and ask to speak with the hospitalist on call if the hospitalist that took care of you is not available. Once you are discharged,  your primary care physician will handle any further medical issues. Please note that NO REFILLS for any discharge medications will be authorized once you are discharged, as it is imperative that you return to your primary care physician (or establish a relationship with a primary care physician if you do not have one) for your aftercare needs so that they can reassess your need for medications and monitor your lab values.  Follow up with PCP and  Medical Oncology within 1-2 weeks. Take all medications as prescribed. If symptoms change or worsen please return to the ED for evaluation   Increase activity slowly   Complete by: As directed      Allergies as of 05/24/2020   No Known Allergies     Medication List    STOP taking these medications   pantoprazole 20 MG tablet Commonly known as: PROTONIX     TAKE these medications   acetaminophen 500 MG tablet Commonly known as: TYLENOL Take 500 mg by mouth every 6 (six) hours as needed for mild pain or moderate pain.   aspirin 81 MG tablet Take 81 mg by mouth daily.   atorvastatin 40 MG tablet Commonly known as: LIPITOR Take 1 tablet (40 mg total) by mouth daily.   azithromycin 500 MG tablet Commonly known as: ZITHROMAX Take 1 tablet (500 mg total) by mouth daily for 1 day. Start taking on: May 25, 2020   cefdinir 300 MG capsule Commonly known as: OMNICEF Take 1 capsule (300 mg total) by mouth every 12 (twelve) hours for 9 doses.   clopidogrel 75 MG tablet Commonly known as: PLAVIX Take 1 tablet (75 mg total) by mouth daily.   diphenoxylate-atropine 2.5-0.025 MG tablet Commonly known as: LOMOTIL Take 2 tablets by mouth 4 (four) times daily as needed for diarrhea or loose stools.   feeding supplement (ENSURE ENLIVE) Liqd Take 237 mLs by mouth 2 (two) times daily between meals.   levothyroxine 50 MCG tablet Commonly known as: SYNTHROID Take 1 tablet (50 mcg total) by mouth daily.   lisinopril 5 MG tablet Commonly known as: ZESTRIL Take 1 tablet (5 mg total) by mouth daily. What changed:   medication strength  how much to take   loperamide 2 MG tablet Commonly known as: IMODIUM A-D Take 2 mg by mouth 4 (four) times daily as needed for diarrhea or loose stools.   metoprolol succinate 25 MG 24 hr tablet Commonly known as: TOPROL-XL Take 0.5 tablets (12.5 mg total) by mouth daily. What changed: how much to take   multivitamin-iron-minerals-folic acid  chewable tablet Chew 1 tablet by mouth daily.   nitroGLYCERIN 0.4 MG SL tablet Commonly known as: NITROSTAT Place 1 tablet under the tongue every 5 (five) minutes as needed for chest pain.   ondansetron 8 MG tablet Commonly known as: Zofran Take 1 tablet (8 mg total) by mouth 2 (two) times daily as needed for refractory nausea / vomiting. Start on day 3 after chemotherapy.   [facility] Colon Health Caps Take 1 capsule by mouth in the morning.   prochlorperazine 10 MG tablet Commonly known as: COMPAZINE Take 1 tablet (10 mg total) by mouth every 6 (six) hours as needed (Nausea or vomiting).       Follow-up Information    [name], MD On 06/07/2020.   Specialty: Family Medicine Why: PCP=Dr. Park Liter [phone]); @ 10:40 am             No Known Allergies  Consultations:  Medical Oncology  Procedures/Studies: DG Chest 2 View  Result Date: 05/21/2020 CLINICAL DATA:  Suspected sepsis. Productive cough and fever. Recently treated for pneumonia with persistent cough. Patient with history of esophageal cancer. EXAM: CHEST - 2 VIEW COMPARISON:  Chest radiograph 04/21/2020.  PET CT 04/27/2020 FINDINGS: Right chest port in place. Air-fluid level within distal esophagus as seen on prior PET. Chronic blunting of the right costophrenic angle, however suspect increase in pleural effusion. Progressive patchy opacities in the superior segment of the right upper lobe from prior exam. Slight increase in right basilar opacities. Left lung is clear. Stable heart size and mediastinal contours. No pneumothorax. IMPRESSION: 1. Progressive patchy opacities in the right upper and lower lobes from prior exam. Findings suspicious for persistent and worsening pneumonia, consider aspiration. Increased right pleural effusion. 2. Air-fluid level within the distal esophagus as seen on prior PET. Electronically Signed   By: Keith Rake M.D.   On: 05/21/2020 22:21   CT Angio Chest PE W  and/or Wo Contrast  Result Date: 05/22/2020 CLINICAL DATA:  Weakness and productive cough. Diagnosed with pneumonia a couple of weeks ago. Decreased oxygen saturation. Decreased blood pressure. EXAM: CT ANGIOGRAPHY CHEST WITH CONTRAST TECHNIQUE: Multidetector CT imaging of the chest was performed using the standard protocol during bolus administration of intravenous contrast. Multiplanar CT image reconstructions and MIPs were obtained to evaluate the vascular anatomy. CONTRAST:  78m OMNIPAQUE IOHEXOL 350 MG/ML SOLN COMPARISON:  CT chest 06/26/2016.  PET-CT 04/27/2020 FINDINGS: Cardiovascular: Satisfactory opacification of the pulmonary arteries to the segmental level. No evidence of pulmonary embolism. Normal heart size. No pericardial effusion. Coronary artery calcifications. Mediastinum/Nodes: There appears to been esophageal resection with gastric pull up procedure. Moderately prominent lymph nodes in the mediastinum, nonspecific. Pretracheal lymph nodes measure about 1.6 cm maximal dimension. Lungs/Pleura: Small left pleural effusion. Nodular infiltrates throughout the right lung with focal consolidation in the right lower lung. Patchy nodular infiltrates in the left lower lung. Mild interstitial thickening. Changes could be due to pneumonia, metastasis, or aspiration. Upper Abdomen: Prominent soft tissue density replaces the fat planes at the celiac axis. This could indicate lymphadenopathy or possibly postoperative change. Musculoskeletal: No chest wall abnormality. No acute or significant osseous findings. Review of the MIP images confirms the above findings. IMPRESSION: 1. No evidence of significant pulmonary embolus. 2. Small left pleural effusion. 3. Nodular ground-glass infiltrates throughout the right lung with consolidation in the right lower lung. Patchy nodular infiltrates in the left lower lung. Changes could be due to pneumonia, metastasis, or aspiration. This is progressing since previous PET-CT.  4. Moderately prominent lymph nodes in the mediastinum, nonspecific but could indicate metastatic disease. 5. Prominent soft tissue density replaces the fat planes at the celiac axis. This could indicate lymphadenopathy or possibly postoperative change. Similar appearance to previous PET-CT. Electronically Signed   By: WLucienne CapersM.D.   On: 05/22/2020 00:15   NM PET Image Restag (PS) Skull Base To Thigh  Result Date: 04/27/2020 CLINICAL DATA:  Subsequent treatment strategy for lower esophageal cancer. Prior neoadjuvant chemo radiotherapy and esophagectomy in 2018, subsequent recurrence. The patient finished a course of 12 rounds of FOLFOX chemotherapy 4 weeks ago and was recently treated for right-sided pneumonia. EXAM: NUCLEAR MEDICINE PET SKULL BASE TO THIGH TECHNIQUE: 6.7 mCi F-18 FDG was injected intravenously. Full-ring PET imaging was performed from the skull base to thigh after the radiotracer. CT data was obtained and used for attenuation correction and anatomic localization. Fasting blood glucose: 127 mg/dl COMPARISON:  Multiple exams,  including 11/09/2019 FINDINGS: Mediastinal blood pool activity: SUV max 1.8 Liver activity: SUV max 2.8 NECK: No significant abnormal hypermetabolic activity in this region. Incidental CT findings: Bilateral common carotid atherosclerotic calcification CHEST: New patchy and nodular airspace opacities the right lower lobe to a lesser extent in the right middle lobe demonstrate accentuated metabolic activity. Example, the right lower lobe airspace opacity has a representative maximum SUV of 3.9 and a 1.7 by 1.2 cm nodular focus of opacity medially in the right middle lobe 4.5. There is also reticulonodular opacity in the right upper lobe for example on image 99/3 in a pattern characteristic for atypical infection, with faint accentuated activity. Given the patient's history, the constellation of findings favors pneumonia, although follow up imaging for surveillance is  likely warranted. Prior similar opacities in different locations in the right upper lobe and left lower lobe on the prior PET-CT from March 2021 have intervally resolved. Incidental CT findings: Esophagectomy with gastric pull-through. Right Port-A-Cath tip: SVC. Coronary, aortic arch, and branch vessel atherosclerotic vascular disease. Scattered reticulonodular opacities in the left upper lobe are likely infectious. Trace right pleural effusion, probably exudative. ABDOMEN/PELVIS: As before, there is indistinctly marginated soft tissue density surrounding the celiac trunk causing poor visibility of the diaphragmatic crura and ill definition of tissue planes along the adjacent pancreas. In the vicinity of the presumed duodenal bulb just below the diaphragm, and in the area of this soft tissue density, there is mildly accentuated activity with maximum SUV of 4.6, previously about 3.6 in the same region. Some of the indistinctness of tissue planes could be related to therapy if radiation therapy is been utilized. Clearly, low-grade residual tumor in this vicinity is a distinct possibility. No significant change in contour of the indistinct soft tissue in this region. No appreciable hypermetabolic hepatic lesion or other worrisome hypermetabolic lesion identified in the abdomen/pelvis. Incidental CT findings: Aortoiliac atherosclerotic vascular disease. Nonobstructive left nephrolithiasis. Infrarenal abdominal aortic ectasia at 2.9 cm diameter. Hazy subcutaneous and mesenteric stranding. Sigmoid diverticulosis. SKELETON: No significant abnormal hypermetabolic activity in this region. Incidental CT findings: Mild chronic findings of avascular necrosis in both femoral heads. Grade 1 degenerative anterolisthesis of L4 on L5 with posterior decompression at this level. IMPRESSION: 1. Roughly similar appearance of indistinctly marginated soft tissue density along the celiac trunk and anterior portion of the upper abdominal  aorta, also obscuring the margin of the adjacent pancreas. Some of this may well be treatment related, but a component of a low-grade residual malignancy is difficult to exclude. Maximum SUV in this vicinity is 4.6 (previously 3.6) although this measurement may include the duodenal bulb and thus could include some physiologic activity. I do not observe significant progression of the soft tissue density. 2. Some of the prior regions of infection in the lungs shown on the PET-CT from March 2021 have resolved. However, there are new regions of multilobar pneumonia and/or atypical infection especially concentrated in the right lower lobe, with moderate associated metabolic activity. Surveillance imaging may be warranted to ensure complete resolution. 3. No findings of hepatic metastatic disease. 4. Other imaging findings of potential clinical significance: Esophagectomy with gastric pull-through. Aortic Atherosclerosis (ICD10-I70.0). Coronary atherosclerosis. Trace right pleural effusion. Chronic bilateral femoral head avascular necrosis. Nonobstructive left nephrolithiasis. Ectatic abdominal aorta. Sigmoid diverticulosis. Electronically Signed   By: Van Clines M.D.   On: 04/27/2020 15:51    Subjective: Seen and examined at bedside he is doing relatively well. No nausea or vomiting. Denies any lightheadedness or dizziness. No  chest pain or shortness of breath. Ambulated without issues and did not desaturate. Was stable for discharge.  Discharge Exam: Vitals:   05/24/20 1106 05/24/20 1530  BP:  115/81  Pulse:  64  Resp:  16  Temp:    SpO2: 94% 95%   Vitals:   05/24/20 0004 05/24/20 0803 05/24/20 1106 05/24/20 1530  BP: 117/62 135/72  115/81  Pulse: (!) 58 (!) 57  64  Resp: 17 16  16   Temp: 98.3 F (36.8 C) 97.8 F (36.6 C)    TempSrc: Oral Oral    SpO2: 97% 95% 94% 95%  Weight:      Height:       General: Pt is alert, awake, not in acute distress Cardiovascular: RRR, S1/S2 +, no rubs,  no gallops Respiratory: Diminished bilaterally, no wheezing, no rhonchi; had unlabored breathing and was not wearing any supplemental oxygen via nasal cannula Abdominal: Soft, NT, ND, bowel sounds + Extremities: No appreciable edema, no cyanosis  The results of significant diagnostics from this hospitalization (including imaging, microbiology, ancillary and laboratory) are listed below for reference.     Microbiology: Recent Results (from the past 240 hour(s))  Culture, blood (Routine x 2)     Status: None (Preliminary result)   Collection Time: 05/21/20  9:22 PM   Specimen: BLOOD  Result Value Ref Range Status   Specimen Description BLOOD BLOOD RIGHT FOREARM  Final   Special Requests   Final    BOTTLES DRAWN AEROBIC AND ANAEROBIC Blood Culture results may not be optimal due to an excessive volume of blood received in culture bottles   Culture   Final    NO GROWTH 3 DAYS Performed at [facility], [address], [place], [place] [address]    Report Status PENDING  Incomplete  SARS Coronavirus 2 by RT PCR (hospital order, performed in [facility] hospital lab) Nasopharyngeal Nasopharyngeal Swab     Status: None   Collection Time: 05/21/20 10:51 PM   Specimen: Nasopharyngeal Swab  Result Value Ref Range Status   SARS Coronavirus 2 NEGATIVE NEGATIVE Final    Comment: (NOTE) SARS-CoV-2 target nucleic acids are NOT DETECTED.  The SARS-CoV-2 RNA is generally detectable in upper and lower respiratory specimens during the acute phase of infection. The lowest concentration of SARS-CoV-2 viral copies this assay can detect is 250 copies / mL. A negative result does not preclude SARS-CoV-2 infection and should not be used as the sole basis for treatment or other patient management decisions.  A negative result may occur with improper specimen collection / handling, submission of specimen other than nasopharyngeal swab, presence of viral mutation(s) within the areas targeted by  this assay, and inadequate number of viral copies (<250 copies / mL). A negative result must be combined with clinical observations, patient history, and epidemiological information.  Fact Sheet for Patients:   StrictlyIdeas.no  Fact Sheet for Healthcare Providers: BankingDealers.co.za  This test is not yet approved or  cleared by the [place] FDA and has been authorized for detection and/or diagnosis of SARS-CoV-2 by FDA under an Emergency Use Authorization (EUA).  This EUA will remain in effect (meaning this test can be used) for the duration of the COVID-19 declaration under Section 564(b)(1) of the Act, 21 U.S.C. section 360bbb-3(b)(1), unless the authorization is terminated or revoked sooner.  Performed at [facility], [address] [place], [place] [address]   Expectorated sputum assessment w rflx to resp cult     Status: None  Collection Time: 05/22/20  3:15 PM   Specimen: Expectorated Sputum  Result Value Ref Range Status   Specimen Description EXPECTORATED SPUTUM  Final   Special Requests NONE  Final   Sputum evaluation   Final    THIS SPECIMEN IS ACCEPTABLE FOR SPUTUM CULTURE Performed at [facility], [address] [place], [place] [address]    Report Status 05/22/2020 FINAL  Final  Culture, respiratory     Status: None (Preliminary result)   Collection Time: 05/22/20  3:15 PM  Result Value Ref Range Status   Specimen Description   Final    EXPECTORATED SPUTUM Performed at [facility], [address], [place], [place] [address]    Special Requests   Final    NONE Reflexed from [id number] Performed at [facility], [address] [place], [name] [address]    Gram Stain   Final    RARE WBC PRESENT, PREDOMINANTLY PMN RARE GRAM POSITIVE COCCI RARE GRAM NEGATIVE RODS    Culture   Final    RARE Normal respiratory flora-no Staph aureus or Pseudomonas seen Performed at  [facility], [address], [place], [place] [address]    Report Status PENDING  Incomplete  MRSA PCR Screening     Status: None   Collection Time: 05/23/20 12:00 AM   Specimen: Nasal Mucosa; Nasopharyngeal  Result Value Ref Range Status   MRSA by PCR NEGATIVE NEGATIVE Final    Comment:        The GeneXpert MRSA Assay (FDA approved for NASAL specimens only), is one component of a comprehensive MRSA colonization surveillance program. It is not intended to diagnose MRSA infection nor to guide or monitor treatment for MRSA infections. Performed at [facility], [address] [place], [place] [address]   Culture, blood (Routine X 2) w Reflex to ID Panel     Status: None (Preliminary result)   Collection Time: 05/23/20  3:52 AM   Specimen: BLOOD  Result Value Ref Range Status   Specimen Description BLOOD RIGHT [facility]  Final   Special Requests   Final    BOTTLES DRAWN AEROBIC AND ANAEROBIC Blood Culture results may not be optimal due to an excessive volume of blood received in culture bottles   Culture   Final    NO GROWTH 1 DAY Performed at [facility], [address] [place], [place] [address]    Report Status PENDING  Incomplete     Labs: BNP (last 3 results) No results for input(s): BNP in the last 8760 hours. Basic Metabolic Panel: Recent Labs  Lab 05/21/20 2121 05/22/20 0333 05/23/20 0352 05/24/20 0705  NA 136 131* 129* 131*  K 5.1 5.0 4.5 3.9  CL 100 98 95* 96*  CO2 29 27 27 27   GLUCOSE 111* 127* 90 95  BUN 24* 26* 24* 19  CREATININE 1.55* 1.51*  1.53* 1.35* 1.21  CALCIUM 8.7* 8.1* 8.2* 8.2*   Liver Function Tests: Recent Labs  Lab 05/21/20 2121  AST 31  ALT 30  ALKPHOS 59  BILITOT 0.7  PROT 8.2*  ALBUMIN 2.9*   No results for input(s): LIPASE, AMYLASE in the last 168 hours. No results for input(s): AMMONIA in the last 168 hours. CBC: Recent Labs  Lab 05/21/20 2121 05/22/20 0333 05/23/20 0352 05/24/20 0705  WBC 9.5  8.5 8.4 5.7  NEUTROABS 8.2*  --   --   --   HGB 9.2* 7.2* 7.3* 7.4*  HCT 29.3* 23.8* 23.8*  23.9*  MCV 98.3 101.7* 100.0 99.6  PLT 195 141* 144* 150   Cardiac Enzymes: No results for input(s): CKTOTAL, CKMB, CKMBINDEX, TROPONINI in the last 168 hours. BNP: Invalid input(s): POCBNP CBG: No results for input(s): GLUCAP in the last 168 hours. D-Dimer No results for input(s): DDIMER in the last 72 hours. Hgb A1c No results for input(s): HGBA1C in the last 72 hours. Lipid Profile No results for input(s): CHOL, HDL, LDLCALC, TRIG, CHOLHDL, LDLDIRECT in the last 72 hours. Thyroid function studies No results for input(s): TSH, T4TOTAL, T3FREE, THYROIDAB in the last 72 hours.  Invalid input(s): FREET3 Anemia work up No results for input(s): VITAMINB12, FOLATE, FERRITIN, TIBC, IRON, RETICCTPCT in the last 72 hours. Urinalysis    Component Value Date/Time   COLORURINE YELLOW (A) 05/21/2020 2251   APPEARANCEUR CLEAR (A) 05/21/2020 2251   APPEARANCEUR Cloudy (A) 06/23/2019 0822   LABSPEC >1.046 (H) 05/21/2020 2251   PHURINE 5.0 05/21/2020 2251   GLUCOSEU NEGATIVE 05/21/2020 2251   HGBUR NEGATIVE 05/21/2020 2251   BILIRUBINUR NEGATIVE 05/21/2020 2251   BILIRUBINUR Negative 06/23/2019 0822   KETONESUR NEGATIVE 05/21/2020 2251   PROTEINUR NEGATIVE 05/21/2020 2251   NITRITE NEGATIVE 05/21/2020 2251   LEUKOCYTESUR NEGATIVE 05/21/2020 2251   Sepsis Labs Invalid input(s): PROCALCITONIN,  WBC,  LACTICIDVEN Microbiology Recent Results (from the past 240 hour(s))  Culture, blood (Routine x 2)     Status: None (Preliminary result)   Collection Time: 05/21/20  9:22 PM   Specimen: BLOOD  Result Value Ref Range Status   Specimen Description BLOOD BLOOD RIGHT FOREARM  Final   Special Requests   Final    BOTTLES DRAWN AEROBIC AND ANAEROBIC Blood Culture results may not be optimal due to an excessive volume of blood received in culture bottles   Culture   Final    NO GROWTH 3 DAYS Performed  at [facility], [address] [place], [place] [address]    Report Status PENDING  Incomplete  SARS Coronavirus 2 by RT PCR (hospital order, performed in [place] hospital lab) Nasopharyngeal Nasopharyngeal Swab     Status: None   Collection Time: 05/21/20 10:51 PM   Specimen: Nasopharyngeal Swab  Result Value Ref Range Status   SARS Coronavirus 2 NEGATIVE NEGATIVE Final    Comment: (NOTE) SARS-CoV-2 target nucleic acids are NOT DETECTED.  The SARS-CoV-2 RNA is generally detectable in upper and lower respiratory specimens during the acute phase of infection. The lowest concentration of SARS-CoV-2 viral copies this assay can detect is 250 copies / mL. A negative result does not preclude SARS-CoV-2 infection and should not be used as the sole basis for treatment or other patient management decisions.  A negative result may occur with improper specimen collection / handling, submission of specimen other than nasopharyngeal swab, presence of viral mutation(s) within the areas targeted by this assay, and inadequate number of viral copies (<250 copies / mL). A negative result must be combined with clinical observations, patient history, and epidemiological information.  Fact Sheet for Patients:   StrictlyIdeas.no  Fact Sheet for Healthcare Providers: BankingDealers.co.za  This test is not yet approved or  cleared by the [place] FDA and has been authorized for detection and/or diagnosis of SARS-CoV-2 by FDA under an Emergency Use Authorization (EUA).  This EUA will remain in effect (meaning this test can be used) for the duration of the COVID-19 declaration under Section 564(b)(1) of the Act, 21 U.S.C. section 360bbb-3(b)(1), unless the authorization is terminated or revoked  sooner.  Performed at [facility], [address] [place], [place] [address]   Expectorated sputum assessment w rflx to resp cult      Status: None   Collection Time: 05/22/20  3:15 PM   Specimen: Expectorated Sputum  Result Value Ref Range Status   Specimen Description EXPECTORATED SPUTUM  Final   Special Requests NONE  Final   Sputum evaluation   Final    THIS SPECIMEN IS ACCEPTABLE FOR SPUTUM CULTURE Performed at [facility], [address] [place], [place] [address]    Report Status 05/22/2020 FINAL  Final  Culture, respiratory     Status: None (Preliminary result)   Collection Time: 05/22/20  3:15 PM  Result Value Ref Range Status   Specimen Description   Final    EXPECTORATED SPUTUM Performed at [facility], [address], [place], [place] [address]    Special Requests   Final    NONE Reflexed from [id number] Performed at [facility], [address] [place], [place] [address]    Gram Stain   Final    RARE WBC PRESENT, PREDOMINANTLY PMN RARE GRAM POSITIVE COCCI RARE GRAM NEGATIVE RODS    Culture   Final    RARE Normal respiratory flora-no Staph aureus or Pseudomonas seen Performed at [facility], [place] [address], [place], [place] [address]    Report Status PENDING  Incomplete  MRSA PCR Screening     Status: None   Collection Time: 05/23/20 12:00 AM   Specimen: Nasal Mucosa; Nasopharyngeal  Result Value Ref Range Status   MRSA by PCR NEGATIVE NEGATIVE Final    Comment:        The GeneXpert MRSA Assay (FDA approved for NASAL specimens only), is one component of a comprehensive MRSA colonization surveillance program. It is not intended to diagnose MRSA infection nor to guide or monitor treatment for MRSA infections. Performed at [facility], [address] [place], [place] [address]   Culture, blood (Routine X 2) w Reflex to ID Panel     Status: None (Preliminary result)   Collection Time: 05/23/20  3:52 AM   Specimen: BLOOD  Result Value Ref Range Status   Specimen Description BLOOD RIGHT [facility]  Final   Special Requests   Final    BOTTLES DRAWN  AEROBIC AND ANAEROBIC Blood Culture results may not be optimal due to an excessive volume of blood received in culture bottles   Culture   Final    NO GROWTH 1 DAY Performed at [facility], [address] [place], [place] [address]    Report Status PENDING  Incomplete   Time coordinating discharge: 35 minutes  SIGNED:  Kerney Elbe, DO [facility] 05/24/2020, 6:36 PM Pager is on [place]  If 7PM-7AM, please contact night-coverage [email or web address]

## 2020-05-24 NOTE — Discharge Planning (Signed)
Patient SPO2 remained 94% or greater on RA while ambulating.  At this time, patient should not need O2 at home if discharged.

## 2020-05-24 NOTE — TOC Progression Note (Signed)
Transition of Care [facility]) - Progression Note    Patient Details  Name: Corey Perry MRN: [id number] Date of Birth: [birth year]  Transition of Care [facility]) CM/SW [place], RN Phone Number: 05/24/2020, 11:07 AM  Clinical Narrative:   Met with the patient in the room to discuss DC plan and needs He lives at home with his wife, he walks unassisted and is independent at home, he stated he does not need DMDE and does not need HH, he has no needs at this time.        Expected Discharge Plan and Services                                                 Social Determinants of Health (SDOH) Interventions    Readmission Risk Interventions No flowsheet data found.

## 2020-05-25 LAB — CULTURE, RESPIRATORY W GRAM STAIN: Culture: NORMAL

## 2020-05-26 ENCOUNTER — Telehealth: Payer: Self-pay

## 2020-05-26 LAB — CULTURE, BLOOD (ROUTINE X 2): Culture: NO GROWTH

## 2020-05-26 NOTE — Telephone Encounter (Signed)
Transition Care Management Follow-up Telephone Call  Date of discharge and from where: [facility] 05/24/2020  How have you been since you were released from the hospital? ok  Any questions or concerns? No  Items Reviewed:  Did the pt receive and understand the discharge instructions provided? Yes   Medications obtained and verified? Yes   Any new allergies since your discharge? No   Dietary orders reviewed? Yes  Do you have support at home? Yes   Functional Questionnaire: (I = Independent and D = Dependent) ADLs: I  Bathing/Dressing- I  Meal Prep- I  Eating- I  Maintaining continence- I  Transferring/Ambulation- I  Managing Meds- I  Follow up appointments reviewed:   PCP Hospital f/u appt confirmed? Yes  Scheduled to see Dr. Wynetta Emery on 06/07/2020 @ 10:40.  Are transportation arrangements needed? No   If their condition worsens, is the pt aware to call PCP or go to the Emergency Dept.? Yes  Was the patient provided with contact information for the PCP's office or ED? Yes  Was to pt encouraged to call back with questions or concerns? Yes

## 2020-05-28 LAB — CULTURE, BLOOD (ROUTINE X 2): Culture: NO GROWTH

## 2020-05-30 ENCOUNTER — Ambulatory Visit: Payer: Medicare Other | Admitting: Family Medicine

## 2020-06-07 ENCOUNTER — Ambulatory Visit (INDEPENDENT_AMBULATORY_CARE_PROVIDER_SITE_OTHER): Payer: Medicare Other | Admitting: Family Medicine

## 2020-06-07 ENCOUNTER — Encounter: Payer: Self-pay | Admitting: Family Medicine

## 2020-06-07 ENCOUNTER — Ambulatory Visit
Admission: RE | Admit: 2020-06-07 | Discharge: 2020-06-07 | Disposition: A | Discharge status: Home / Self Care | Payer: Medicare Other | Source: Ambulatory Visit | Attending: Family Medicine | Admitting: Family Medicine

## 2020-06-07 ENCOUNTER — Ambulatory Visit
Admission: RE | Admit: 2020-06-07 | Discharge: 2020-06-07 | Disposition: A | Discharge status: Home / Self Care | Payer: Medicare Other | Attending: Family Medicine | Admitting: Family Medicine

## 2020-06-07 ENCOUNTER — Other Ambulatory Visit: Payer: Self-pay

## 2020-06-07 VITALS — BP 110/67 | HR 67 | Temp 98.3°F | Wt 129.0 lb

## 2020-06-07 DIAGNOSIS — C155 Malignant neoplasm of lower third of esophagus: Secondary | ICD-10-CM | POA: Diagnosis not present

## 2020-06-07 DIAGNOSIS — J189 Pneumonia, unspecified organism: Secondary | ICD-10-CM

## 2020-06-07 DIAGNOSIS — N179 Acute kidney failure, unspecified: Secondary | ICD-10-CM | POA: Diagnosis not present

## 2020-06-07 DIAGNOSIS — Z23 Encounter for immunization: Secondary | ICD-10-CM

## 2020-06-07 DIAGNOSIS — N189 Chronic kidney disease, unspecified: Secondary | ICD-10-CM

## 2020-06-07 DIAGNOSIS — R918 Other nonspecific abnormal finding of lung field: Secondary | ICD-10-CM | POA: Diagnosis not present

## 2020-06-07 DIAGNOSIS — D6481 Anemia due to antineoplastic chemotherapy: Secondary | ICD-10-CM

## 2020-06-07 DIAGNOSIS — I959 Hypotension, unspecified: Secondary | ICD-10-CM | POA: Diagnosis not present

## 2020-06-07 NOTE — Progress Notes (Signed)
BP 110/67   Pulse 67   Temp 98.3 F (36.8 C) (Oral)   Wt 129 lb (58.5 kg)   BMI 19.05 kg/m    Subjective:    Patient ID: Corey Perry, male    DOB: 04-25-1946, 74 y.o.   MRN: [id number]  HPI: NAFTALI CARCHI is a 74 y.o. male  Chief Complaint  Patient presents with  . Hospitalization Follow-up   Transition of Care Hospital Follow up.   Hospital/Facility: [facility] D/C Physician: Dr. Alfredia Ferguson D/C Date: 05/24/20   Records Requested: 06/07/20 Records Received: 06/07/20 Records Reviewed: 06/07/20  Diagnoses on Discharge:   Septic shock [facility]) Active Problems:   Esophageal cancer ([place])   Multifocal pneumonia   Hypotension   CKD (chronic kidney disease) stage 3, GFR 30-59 ml/min   Acute respiratory failure with hypoxia ([facility])   Malnutrition of moderate degree   Severe sepsis with septic shock ([place])  Date of interactive Contact within 48 hours of discharge: 05/26/20 Contact was through: phone  Date of 7 day or 14 day face-to-face visit: 06/07/20   within 14 days  Outpatient Encounter Medications as of 06/07/2020  Medication Sig  . acetaminophen (TYLENOL) 500 MG tablet Take 500 mg by mouth every 6 (six) hours as needed for mild pain or moderate pain.  Marland Kitchen aspirin 81 MG tablet Take 81 mg by mouth daily.   Marland Kitchen atorvastatin (LIPITOR) 40 MG tablet Take 1 tablet (40 mg total) by mouth daily.  . clopidogrel (PLAVIX) 75 MG tablet Take 1 tablet (75 mg total) by mouth daily.  . diphenoxylate-atropine (LOMOTIL) 2.5-0.025 MG tablet Take 2 tablets by mouth 4 (four) times daily as needed for diarrhea or loose stools.  . feeding supplement, ENSURE ENLIVE, (ENSURE ENLIVE) LIQD Take 237 mLs by mouth 2 (two) times daily between meals.  Marland Kitchen levothyroxine (SYNTHROID) 50 MCG tablet Take 1 tablet (50 mcg total) by mouth daily.  Marland Kitchen loperamide (IMODIUM A-D) 2 MG tablet Take 2 mg by mouth 4 (four) times daily as needed for diarrhea or loose stools.   . metoprolol succinate (TOPROL-XL) 25 MG 24 hr tablet Take  0.5 tablets (12.5 mg total) by mouth daily.  . multivitamin-iron-minerals-folic acid (CENTRUM) chewable tablet Chew 1 tablet by mouth daily.  . nitroGLYCERIN (NITROSTAT) 0.4 MG SL tablet Place 1 tablet under the tongue every 5 (five) minutes as needed for chest pain.   Marland Kitchen ondansetron (ZOFRAN) 8 MG tablet Take 1 tablet (8 mg total) by mouth 2 (two) times daily as needed for refractory nausea / vomiting. Start on day 3 after chemotherapy.  . pantoprazole (PROTONIX) 20 MG tablet Take 20 mg by mouth daily.  . Probiotic Product ([place]) CAPS Take 1 capsule by mouth in the morning.  . prochlorperazine (COMPAZINE) 10 MG tablet Take 1 tablet (10 mg total) by mouth every 6 (six) hours as needed (Nausea or vomiting).  . [DISCONTINUED] lisinopril (ZESTRIL) 5 MG tablet Take 1 tablet (5 mg total) by mouth daily.   No facility-administered encounter medications on file as of 06/07/2020.  Per hospitalist: "Brief/Interim Summary: The patient is a 74 year old Caucasian male with a past medical history significant for but not limited to atrial fibrillation not on any anticoagulation, CAD, hypothyroidism, obstructive sleep apnea not on CPAP, and history of esophageal cancer in 2017 status post chemotherapy and radiation with recurrence in 2021 who recently completed FOLFOX who presents with recurrent pneumonia for the last 3 weeks. Patient first developed fever, cough, shortness breath 3 weeks ago and  was started on Augmentin and doxycycline in outpatient setting without improvement. He progressed to diminished appetite and decreased oral intake and had generalized weakness. On the day of admission he had a fever 101.5 at home and took acetaminophen and he presented to the ER. In the ER CT of the chest was done which showed pulmonary embolus but did show multifocal pneumonia versus metastasis in the left lung base. He was initially started on Rocephin and azithromycin. On admission he was found to be in septic  shock as he was given fluid resuscitation without response and was then subsequently started on Levophed short-term. Levophed was subsequently weaned off and he improved his blood pressures. He is treated with IV cefepime and this was changed to p.o. cefdinir at discharge and vancomycin was stopped given his MRCP negative. His sepsis physiology improved his blood pressure stabilized and he significantly improved. He ambulated without desaturating. Medical oncology was consulted during this hospitalization and cleared the patient for discharge we will follow him up in outpatient setting. Currently stable to be discharged at this time as he is significantly improved from admission and because his blood pressures are stabilized. His antihypertensives were reduced and he will need to follow-up with PCP and medical oncology in outpatient setting."  Diagnostic Tests Reviewed: CLINICAL DATA:  Suspected sepsis. Productive cough and fever. Recently treated for pneumonia with persistent cough. Patient with history of esophageal cancer.  EXAM: CHEST - 2 VIEW  COMPARISON:  Chest radiograph 04/21/2020.  PET CT 04/27/2020  FINDINGS: Right chest port in place. Air-fluid level within distal esophagus as seen on prior PET. Chronic blunting of the right costophrenic angle, however suspect increase in pleural effusion. Progressive patchy opacities in the superior segment of the right upper lobe from prior exam. Slight increase in right basilar opacities. Left lung is clear. Stable heart size and mediastinal contours. No pneumothorax.  IMPRESSION: 1. Progressive patchy opacities in the right upper and lower lobes from prior exam. Findings suspicious for persistent and worsening pneumonia, consider aspiration. Increased right pleural effusion. 2. Air-fluid level within the distal esophagus as seen on prior PET.  CLINICAL DATA:  Weakness and productive cough. Diagnosed with pneumonia a couple of weeks ago.  Decreased oxygen saturation. Decreased blood pressure.  EXAM: CT ANGIOGRAPHY CHEST WITH CONTRAST  TECHNIQUE: Multidetector CT imaging of the chest was performed using the standard protocol during bolus administration of intravenous contrast. Multiplanar CT image reconstructions and MIPs were obtained to evaluate the vascular anatomy.  CONTRAST:  41mL OMNIPAQUE IOHEXOL 350 MG/ML SOLN  COMPARISON:  CT chest 06/26/2016.  PET-CT 04/27/2020  FINDINGS: Cardiovascular: Satisfactory opacification of the pulmonary arteries to the segmental level. No evidence of pulmonary embolism. Normal heart size. No pericardial effusion. Coronary artery calcifications.  Mediastinum/Nodes: There appears to been esophageal resection with gastric pull up procedure. Moderately prominent lymph nodes in the mediastinum, nonspecific. Pretracheal lymph nodes measure about 1.6 cm maximal dimension.  Lungs/Pleura: Small left pleural effusion. Nodular infiltrates throughout the right lung with focal consolidation in the right lower lung. Patchy nodular infiltrates in the left lower lung. Mild interstitial thickening. Changes could be due to pneumonia, metastasis, or aspiration.  Upper Abdomen: Prominent soft tissue density replaces the fat planes at the celiac axis. This could indicate lymphadenopathy or possibly postoperative change.  Musculoskeletal: No chest wall abnormality. No acute or significant osseous findings.  Review of the MIP images confirms the above findings.  IMPRESSION: 1. No evidence of significant pulmonary embolus. 2. Small left pleural effusion. 3.  Nodular ground-glass infiltrates throughout the right lung with consolidation in the right lower lung. Patchy nodular infiltrates in the left lower lung. Changes could be due to pneumonia, metastasis, or aspiration. This is progressing since previous PET-CT. 4. Moderately prominent lymph nodes in the mediastinum,  nonspecific but could indicate metastatic disease. 5. Prominent soft tissue density replaces the fat planes at the celiac axis. This could indicate lymphadenopathy or possibly postoperative change. Similar appearance to previous PET-CT. Disposition: Home  Consults: Oncology  Discharge Instructions: Recommendations for Outpatient Follow-up:  1. Follow up with PCP in 1-2 weeks 2. Follow up with Medical Oncology Dr. [name] within 1-2 weeks 3. Please obtain CMP/CBC, Mag, Phos in one week Please follow up on the following pending results: FINAL BLOOD CX  Disease/illness Education: Discussed today.   Home Health/Community Services Discussions/Referrals: N/A  Establishment or re-establishment of referral orders for community resources: N/A  Discussion with other health care providers: None  Assessment and Support of treatment regimen adherence: Excellent  Appointments Coordinated with: Patient  Education for self-management, independent living, and ADLs:  Discussed today.   Has still been feeling very tired and weak since he got out of the hospital. He is better than when he went in, but still not feeling well. He is still coughing stuff up- lime green. No fevers. He has not been significantly SOB. No other concerns at this time.   Relevant past medical, surgical, family and social history reviewed and updated as indicated. Interim medical history since our last visit reviewed. Allergies and medications reviewed and updated.  Review of Systems  Constitutional: Positive for fatigue. Negative for activity change, appetite change, chills, diaphoresis, fever and unexpected weight change.  HENT: Negative.   Eyes: Negative.   Respiratory: Positive for cough and chest tightness. Negative for apnea, choking, shortness of breath, wheezing and stridor.   Cardiovascular: Negative.   Gastrointestinal: Negative.   Psychiatric/Behavioral: Negative.     Per HPI unless specifically indicated  above     Objective:    BP 110/67   Pulse 67   Temp 98.3 F (36.8 C) (Oral)   Wt 129 lb (58.5 kg)   BMI 19.05 kg/m   Wt Readings from Last 3 Encounters:  06/07/20 129 lb (58.5 kg)  05/21/20 125 lb (56.7 kg)  05/01/20 125 lb 12.8 oz (57.1 kg)    Physical Exam Vitals and nursing note reviewed.  Constitutional:      General: He is not in acute distress.    Appearance: Normal appearance. He is ill-appearing. He is not toxic-appearing or diaphoretic.  HENT:     Head: Normocephalic and atraumatic.     Right Ear: External ear normal.     Left Ear: External ear normal.     Nose: Nose normal.     Mouth/Throat:     Mouth: Mucous membranes are moist.     Pharynx: Oropharynx is clear.  Eyes:     General: No scleral icterus.       Right eye: No discharge.        Left eye: No discharge.     Extraocular Movements: Extraocular movements intact.     Conjunctiva/sclera: Conjunctivae normal.     Pupils: Pupils are equal, round, and reactive to light.  Cardiovascular:     Rate and Rhythm: Normal rate and regular rhythm.     Pulses: Normal pulses.     Heart sounds: Normal heart sounds. No murmur heard.  No friction rub. No gallop.   Pulmonary:  Effort: Pulmonary effort is normal. No respiratory distress.     Breath sounds: No stridor. Rhonchi (R base) present. No wheezing or rales.  Chest:     Chest wall: No tenderness.  Musculoskeletal:        General: Normal range of motion.     Cervical back: Normal range of motion and neck supple.  Skin:    General: Skin is warm and dry.     Capillary Refill: Capillary refill takes less than 2 seconds.     Coloration: Skin is not jaundiced or pale.     Findings: No bruising, erythema, lesion or rash.  Neurological:     General: No focal deficit present.     Mental Status: He is alert and oriented to person, place, and time. Mental status is at baseline.  Psychiatric:        Mood and Affect: Mood normal.        Behavior: Behavior normal.         Thought Content: Thought content normal.        Judgment: Judgment normal.     Results for orders placed or performed in visit on 06/07/20  CBC with Differential/Platelet  Result Value Ref Range   WBC 5.2 3.4 - 10.8 x10E3/uL   RBC 2.88 (L) 4.14 - 5.80 x10E6/uL   Hemoglobin 8.7 (L) 13.0 - 17.7 g/dL   Hematocrit 27.9 (L) 37.5 - 51.0 %   MCV 97 79 - 97 fL   MCH 30.2 26.6 - 33.0 pg   MCHC 31.2 (L) 31 - 35 g/dL   RDW 16.3 (H) 11.6 - 15.4 %   Platelets 141 (L) 150 - 450 x10E3/uL   Neutrophils 80 Not Estab. %   Lymphs 11 Not Estab. %   Monocytes 8 Not Estab. %   Eos 1 Not Estab. %   Basos 0 Not Estab. %   Neutrophils Absolute 4.1 1 - 7 x10E3/uL   Lymphocytes Absolute 0.6 (L) 0 - 3 x10E3/uL   Monocytes Absolute 0.4 0 - 0 x10E3/uL   EOS (ABSOLUTE) 0.1 0.0 - 0.4 x10E3/uL   Basophils Absolute 0.0 0 - 0 x10E3/uL   Immature Granulocytes 0 Not Estab. %   Immature Grans (Abs) 0.0 0.0 - 0.1 x10E3/uL  Comprehensive metabolic panel  Result Value Ref Range   Glucose 89 65 - 99 mg/dL   BUN 18 8 - 27 mg/dL   Creatinine, Ser 1.28 (H) 0.76 - 1.27 mg/dL   GFR calc non Af Amer 55 (L) >59 mL/min/1.73   GFR calc Af Amer 64 >59 mL/min/1.73   BUN/Creatinine Ratio 14 10 - 24   Sodium 141 134 - 144 mmol/L   Potassium 4.5 3.5 - 5.2 mmol/L   Chloride 102 96 - 106 mmol/L   CO2 25 20 - 29 mmol/L   Calcium 8.6 8.6 - 10.2 mg/dL   Total Protein 7.6 6.0 - 8.5 g/dL   Albumin 3.2 (L) 3.7 - 4.7 g/dL   Globulin, Total 4.4 1.5 - 4.5 g/dL   Albumin/Globulin Ratio 0.7 (L) 1.2 - 2.2   Bilirubin Total 0.4 0.0 - 1.2 mg/dL   Alkaline Phosphatase 72 44 - 121 IU/L   AST 43 (H) 0 - 40 IU/L   ALT 35 0 - 44 IU/L  Magnesium  Result Value Ref Range   Magnesium 2.1 1.6 - 2.3 mg/dL  Phosphorus  Result Value Ref Range   Phosphorus 3.3 2.8 - 4.1 mg/dL      Assessment & Plan:  Problem List Items Addressed This Visit      Cardiovascular and Mediastinum   Hypotension    BP still running low. Will stop  lisinopril and recheck about 2 weeks.         Respiratory   Multifocal pneumonia - Primary    Will repeat CXR and continue abx if needed. Continue to monitor. Call with any concerns.       Relevant Orders   CBC with Differential/Platelet (Completed)   Comprehensive metabolic panel (Completed)   Magnesium (Completed)   Phosphorus (Completed)   DG Chest 2 View (Completed)     Digestive   Esophageal cancer ([place])    Continue to follow with oncology. Continue to monitor. Call with any concerns.       Relevant Orders   Comprehensive metabolic panel (Completed)   Magnesium (Completed)   Phosphorus (Completed)    Other Visit Diagnoses    Anemia following use of chemotherapeutic drug       Rechecking labs today. Await results. Treat as needed.   Relevant Orders   CBC with Differential/Platelet (Completed)   Comprehensive metabolic panel (Completed)   Magnesium (Completed)   Phosphorus (Completed)   Acute renal failure superimposed on chronic kidney disease, unspecified CKD stage, unspecified acute renal failure type ([place])       Rechecking labs today. Await results. Treat as needed.    Relevant Orders   Comprehensive metabolic panel (Completed)   Magnesium (Completed)   Phosphorus (Completed)   Flu vaccine need       Flu shot given today.   Relevant Orders   Flu Vaccine QUAD High Dose(Fluad) (Completed)       Follow up plan: Return in about 2 weeks (around 06/21/2020).

## 2020-06-08 LAB — CBC WITH DIFFERENTIAL/PLATELET
Basophils Absolute: 0 10*3/uL (ref 0.0–0.2)
Basos: 0 %
EOS (ABSOLUTE): 0.1 10*3/uL (ref 0.0–0.4)
Eos: 1 %
Hematocrit: 27.9 % — ABNORMAL LOW (ref 37.5–51.0)
Hemoglobin: 8.7 g/dL — ABNORMAL LOW (ref 13.0–17.7)
Immature Grans (Abs): 0 10*3/uL (ref 0.0–0.1)
Immature Granulocytes: 0 %
Lymphocytes Absolute: 0.6 10*3/uL — ABNORMAL LOW (ref 0.7–3.1)
Lymphs: 11 %
MCH: 30.2 pg (ref 26.6–33.0)
MCHC: 31.2 g/dL — ABNORMAL LOW (ref 31.5–35.7)
MCV: 97 fL (ref 79–97)
Monocytes Absolute: 0.4 10*3/uL (ref 0.1–0.9)
Monocytes: 8 %
Neutrophils Absolute: 4.1 10*3/uL (ref 1.4–7.0)
Neutrophils: 80 %
Platelets: 141 10*3/uL — ABNORMAL LOW (ref 150–450)
RBC: 2.88 x10E6/uL — ABNORMAL LOW (ref 4.14–5.80)
RDW: 16.3 % — ABNORMAL HIGH (ref 11.6–15.4)
WBC: 5.2 10*3/uL (ref 3.4–10.8)

## 2020-06-08 LAB — COMPREHENSIVE METABOLIC PANEL
ALT: 35 IU/L (ref 0–44)
AST: 43 IU/L — ABNORMAL HIGH (ref 0–40)
Albumin/Globulin Ratio: 0.7 — ABNORMAL LOW (ref 1.2–2.2)
Albumin: 3.2 g/dL — ABNORMAL LOW (ref 3.7–4.7)
Alkaline Phosphatase: 72 IU/L (ref 44–121)
BUN/Creatinine Ratio: 14 (ref 10–24)
BUN: 18 mg/dL (ref 8–27)
Bilirubin Total: 0.4 mg/dL (ref 0.0–1.2)
CO2: 25 mmol/L (ref 20–29)
Calcium: 8.6 mg/dL (ref 8.6–10.2)
Chloride: 102 mmol/L (ref 96–106)
Creatinine, Ser: 1.28 mg/dL — ABNORMAL HIGH (ref 0.76–1.27)
GFR calc Af Amer: 64 mL/min/{1.73_m2} (ref >59)
GFR calc non Af Amer: 55 mL/min/{1.73_m2} — ABNORMAL LOW (ref >59)
Globulin, Total: 4.4 g/dL (ref 1.5–4.5)
Glucose: 89 mg/dL (ref 65–99)
Potassium: 4.5 mmol/L (ref 3.5–5.2)
Sodium: 141 mmol/L (ref 134–144)
Total Protein: 7.6 g/dL (ref 6.0–8.5)

## 2020-06-08 LAB — PHOSPHORUS: Phosphorus: 3.3 mg/dL (ref 2.8–4.1)

## 2020-06-08 LAB — MAGNESIUM: Magnesium: 2.1 mg/dL (ref 1.6–2.3)

## 2020-06-09 ENCOUNTER — Telehealth: Payer: Self-pay | Admitting: Family Medicine

## 2020-06-09 MED ORDER — LEVOFLOXACIN 750 MG PO TABS: 750.0000 mg | ORAL_TABLET | Freq: Every day | ORAL | 0 refills | Status: DC

## 2020-06-09 NOTE — Telephone Encounter (Signed)
Copied from [place] [phone]. Topic: General - Inquiry >> Jun 09, 2020  2:41 PM Greggory Keen D wrote: Reason for CRM: Pt's wife called to ask about her husbands chest xray that he had done on [facility] last week  CB#  [phone]

## 2020-06-09 NOTE — Telephone Encounter (Signed)
Routing to provider. Do not see a result note.

## 2020-06-09 NOTE — Telephone Encounter (Signed)
Tried calling patient;s wife but the call dropped. Called patient and notified him of results and antibiotic.

## 2020-06-09 NOTE — Telephone Encounter (Signed)
Sent through mychart at 1:13PM- Lenzy, things look good. You're less anemic, but your blood count is still down. You did have some left over pneumonia on your CXR- so I've sent you through an antibiotic.

## 2020-06-10 ENCOUNTER — Encounter: Payer: Self-pay | Admitting: Family Medicine

## 2020-06-10 NOTE — Assessment & Plan Note (Signed)
Will repeat CXR and continue abx if needed. Continue to monitor. Call with any concerns.

## 2020-06-10 NOTE — Assessment & Plan Note (Signed)
BP still running low. Will stop lisinopril and recheck about 2 weeks.

## 2020-06-10 NOTE — Assessment & Plan Note (Signed)
Continue to follow with oncology. Continue to monitor. Call with any concerns.

## 2020-06-14 ENCOUNTER — Telehealth: Payer: Self-pay

## 2020-06-14 NOTE — Telephone Encounter (Signed)
Palliative care SW outreached patient to conduct telephonic visit. SW awaiting return call.

## 2020-06-22 ENCOUNTER — Inpatient Hospital Stay (HOSPITAL_BASED_OUTPATIENT_CLINIC_OR_DEPARTMENT_OTHER): Payer: Medicare Other | Admitting: Hospice and Palliative Medicine

## 2020-06-22 ENCOUNTER — Other Ambulatory Visit: Payer: Self-pay

## 2020-06-22 ENCOUNTER — Inpatient Hospital Stay: Payer: Medicare Other | Attending: Oncology

## 2020-06-22 DIAGNOSIS — N400 Enlarged prostate without lower urinary tract symptoms: Secondary | ICD-10-CM | POA: Diagnosis not present

## 2020-06-22 DIAGNOSIS — R1310 Dysphagia, unspecified: Secondary | ICD-10-CM | POA: Diagnosis not present

## 2020-06-22 DIAGNOSIS — C155 Malignant neoplasm of lower third of esophagus: Secondary | ICD-10-CM | POA: Insufficient documentation

## 2020-06-22 DIAGNOSIS — Z95828 Presence of other vascular implants and grafts: Secondary | ICD-10-CM

## 2020-06-22 DIAGNOSIS — R634 Abnormal weight loss: Secondary | ICD-10-CM | POA: Insufficient documentation

## 2020-06-22 DIAGNOSIS — E039 Hypothyroidism, unspecified: Secondary | ICD-10-CM | POA: Insufficient documentation

## 2020-06-22 DIAGNOSIS — E785 Hyperlipidemia, unspecified: Secondary | ICD-10-CM | POA: Diagnosis not present

## 2020-06-22 DIAGNOSIS — Z79899 Other long term (current) drug therapy: Secondary | ICD-10-CM | POA: Insufficient documentation

## 2020-06-22 DIAGNOSIS — Z515 Encounter for palliative care: Secondary | ICD-10-CM

## 2020-06-22 DIAGNOSIS — I2510 Atherosclerotic heart disease of native coronary artery without angina pectoris: Secondary | ICD-10-CM | POA: Insufficient documentation

## 2020-06-22 DIAGNOSIS — J449 Chronic obstructive pulmonary disease, unspecified: Secondary | ICD-10-CM | POA: Diagnosis not present

## 2020-06-22 DIAGNOSIS — K219 Gastro-esophageal reflux disease without esophagitis: Secondary | ICD-10-CM | POA: Insufficient documentation

## 2020-06-22 DIAGNOSIS — I2583 Coronary atherosclerosis due to lipid rich plaque: Secondary | ICD-10-CM

## 2020-06-22 DIAGNOSIS — J189 Pneumonia, unspecified organism: Secondary | ICD-10-CM

## 2020-06-22 HISTORY — DX: Pneumonia, unspecified organism: J18.9

## 2020-06-22 MED ORDER — HEPARIN SOD (PORK) LOCK FLUSH 100 UNIT/ML IV SOLN
INTRAVENOUS | Status: AC
  Filled 2020-06-22: qty 5

## 2020-06-22 MED ORDER — SODIUM CHLORIDE 0.9% FLUSH
10.0000 mL | Freq: Once | INTRAVENOUS | Status: AC
  Administered 2020-06-22: 10 mL via INTRAVENOUS
  Filled 2020-06-22: qty 10

## 2020-06-22 MED ORDER — HEPARIN SOD (PORK) LOCK FLUSH 100 UNIT/ML IV SOLN
500.0000 [IU] | Freq: Once | INTRAVENOUS | Status: AC
  Administered 2020-06-22: 500 [IU] via INTRAVENOUS
  Filled 2020-06-22: qty 5

## 2020-06-22 NOTE — Progress Notes (Signed)
[facility]  Telephone:([phone] [phone]   Name: Corey Perry Date: 06/22/2020 MRN: [id number]  DOB: [birth year]  Patient Care Team: Valerie Roys, DO as PCP - General (Family Medicine) [name], MD as PCP - Family Medicine (Family Medicine) [name], MD as Consulting Physician (Oncology) [name], MD as Referring Physician [name], MD as Consulting Physician (Cardiology)    REASON FOR CONSULTATION: Corey Perry is a 74 y.o. male with multiple medical problems including recurrent adenocarcinoma of the esophagus status post chemotherapy/XRT in 2017 with esophagectomy on 10/07/2016.  Patient has history of dysphagia with esophageal stricture requiring dilation.  He has had recent worsening dysphagia with weight loss.  Patient was found to have recurrent adenocarcinoma in the lower third of the esophagus now s/p FOLFOX on active surveillance.    Patient was hospitalized 05/21/2020 -05/24/2020 with sepsis from multifocal pneumonia.  Patient was referred to palliative care to help address goals and manage ongoing symptoms.  SOCIAL HISTORY:     reports that he quit smoking about 15 years ago. His smoking use included cigarettes. He has a 40.00 pack-year smoking history. He has quit using smokeless tobacco.  His smokeless tobacco use included chew. He reports current alcohol use of about 1.0 standard drink of alcohol per week. He reports that he does not use drugs.  Patient is married and lives at home with his wife.  He has a daughter who lives in [place].  Patient had a son who died in his 30Z from complications of COPD.  Patient worked in a [employer] techn[employer].  ADVANCE DIRECTIVES:  Does not have  CODE STATUS: DNR/DNI (DNR form completed on 11/24/19)  PAST MEDICAL HISTORY: Past Medical History:  Diagnosis Date   Abnormal white blood cell (WBC) count    seeing Dr. [name] at [facility]  [place] on 10/25   Anemia    Atrial fibrillation (HCC)    BPH (benign prostatic hypertrophy)    Bradycardia    CAD (coronary artery disease)    Dysrhythmia    bradycardia - sees Dr. Humphrey Rolls   Esophageal cancer [facility]) 05/2016   GERD (gastroesophageal reflux disease)    History of kidney stones    Hyperlipidemia    Hypertension    Hypothyroidism    Kidney stone    Myocardial infarction ([place]) 2005   Pneumonia    S/P angioplasty with stent 2005   x 4 vessels   Sleep apnea    mild-NO CPAP   Spinal stenosis    lumbar    PAST SURGICAL HISTORY:  Past Surgical History:  Procedure Laterality Date   BACK SURGERY     CARDIAC CATHETERIZATION Left 03/12/2016   Procedure: Left Heart Cath and Coronary Angiography;  Surgeon: [name], MD;  Location: [facility];  Service: Cardiovascular;  Laterality: Left;   CARDIAC CATHETERIZATION N/A 03/12/2016   Procedure: Coronary Stent Intervention;  Surgeon: Yolonda Kida, MD;  Location: [facility];  Service: Cardiovascular;  Laterality: N/A;   COLONOSCOPY WITH PROPOFOL N/A 07/13/2015   Procedure: COLONOSCOPY WITH PROPOFOL;  Surgeon: Lucilla Lame, MD;  Location: [facility];  Service: Endoscopy;  Laterality: N/A;   CORONARY ANGIOPLASTY  10/05 and 12/05   4 DES placed   CYSTOSCOPY W/ RETROGRADES Left 06/15/2019   Procedure: CYSTOSCOPY WITH RETROGRADE PYELOGRAM;  Surgeon: Abbie Sons, MD;  Location: [facility];  Service: Urology;  Laterality: Left;  CYSTOSCOPY/URETEROSCOPY/HOLMIUM LASER/STENT PLACEMENT Left 06/15/2019   Procedure: CYSTOSCOPY/URETEROSCOPY/HOLMIUM LASER/STENT PLACEMENT;  Surgeon: Abbie Sons, MD;  Location: [facility];  Service: Urology;  Laterality: Left;   ESOPHAGECTOMY  10/07/2016   @ [facility]   ESOPHAGOGASTRODUODENOSCOPY (EGD) WITH PROPOFOL N/A 06/14/2016   Procedure: ESOPHAGOGASTRODUODENOSCOPY (EGD) WITH PROPOFOL;  Surgeon: [name], MD;  Location: [facility];   Service: Endoscopy;  Laterality: N/A;   HERNIA REPAIR Bilateral    x3   INGUINAL HERNIA REPAIR Right 08/28/2017   Large PerFix plug, recurrent hernia;  Surgeon: Robert Bellow, MD;  Location: [facility];  Service: General;  Laterality: Right;  recurrent   KIDNEY STONE SURGERY  07/15/2019   POLYPECTOMY  07/13/2015   Procedure: POLYPECTOMY;  Surgeon: Lucilla Lame, MD;  Location: [facility];  Service: Endoscopy;;   PORT-A-CATH REMOVAL N/A 01/16/2017   Procedure: REMOVAL PORT-A-CATH;  Surgeon: Olean Ree, MD;  Location: [facility];  Service: General;  Laterality: N/A;   PORTACATH PLACEMENT N/A 07/08/2016   Procedure: INSERTION PORT-A-CATH;  Surgeon: Olean Ree, MD;  Location: [facility];  Service: General;  Laterality: N/A;   PORTACATH PLACEMENT Right 08/30/2019   Procedure: INSERTION PORT-A-CATH;  Surgeon: Olean Ree, MD;  Location: [facility];  Service: General;  Laterality: Right;    HEMATOLOGY/ONCOLOGY HISTORY:  Oncology History  Esophageal cancer ([place])  06/23/2016 Initial Diagnosis   Esophageal cancer ([place])   09/01/2019 -  Chemotherapy   The patient had palonosetron (ALOXI) injection 0.25 mg, 0.25 mg, Intravenous,  Once, 12 of 12 cycles Administration: 0.25 mg (09/01/2019), 0.25 mg (09/22/2019), 0.25 mg (11/10/2019), 0.25 mg (11/24/2019), 0.25 mg (10/06/2019), 0.25 mg (10/20/2019), 0.25 mg (12/15/2019), 0.25 mg (01/05/2020), 0.25 mg (01/26/2020), 0.25 mg (02/16/2020), 0.25 mg (03/08/2020), 0.25 mg (03/29/2020) pegfilgrastim-cbqv (UDENYCA) injection 6 mg, 6 mg, Subcutaneous, Once, 10 of 10 cycles Administration: 6 mg (10/08/2019), 6 mg (10/22/2019), 6 mg (11/12/2019), 6 mg (12/17/2019), 6 mg (01/07/2020), 6 mg (01/28/2020), 6 mg (02/18/2020), 6 mg (03/10/2020), 6 mg (03/31/2020) leucovorin 700 mg in dextrose 5 % 250 mL infusion, 704 mg, Intravenous,  Once, 12 of 12 cycles Administration: 700 mg (09/01/2019), 700 mg (11/10/2019), 700 mg (11/24/2019), 700 mg (10/06/2019), 700 mg (10/20/2019), 700 mg  (12/15/2019), 700 mg (01/05/2020), 700 mg (01/26/2020), 700 mg (02/16/2020), 700 mg (03/08/2020), 700 mg (03/29/2020) oxaliplatin (ELOXATIN) 150 mg in dextrose 5 % 500 mL chemo infusion, 85 mg/m2 = 150 mg, Intravenous,  Once, 12 of 12 cycles Administration: 150 mg (09/01/2019), 150 mg (09/22/2019), 150 mg (11/10/2019), 150 mg (11/24/2019), 150 mg (10/06/2019), 150 mg (10/20/2019), 150 mg (12/15/2019), 150 mg (01/05/2020), 150 mg (01/26/2020), 150 mg (02/16/2020), 150 mg (03/08/2020), 150 mg (03/29/2020) fluorouracil (ADRUCIL) chemo injection 700 mg, 400 mg/m2 = 700 mg, Intravenous,  Once, 12 of 12 cycles Administration: 700 mg (09/01/2019), 700 mg (09/22/2019), 700 mg (11/10/2019), 700 mg (11/24/2019), 700 mg (10/06/2019), 700 mg (10/20/2019), 700 mg (12/15/2019), 700 mg (01/05/2020), 700 mg (01/26/2020), 700 mg (02/16/2020), 700 mg (03/08/2020), 700 mg (03/29/2020) fluorouracil (ADRUCIL) 4,200 mg in sodium chloride 0.9 % 66 mL chemo infusion, 2,400 mg/m2 = 4,200 mg, Intravenous, 1 Day/Dose, 12 of 12 cycles Administration: 4,200 mg (09/01/2019), 4,200 mg (09/22/2019), 4,200 mg (11/10/2019), 4,200 mg (11/24/2019), 4,200 mg (10/06/2019), 4,200 mg (10/20/2019), 4,200 mg (12/15/2019), 4,200 mg (01/05/2020), 4,200 mg (01/26/2020), 4,200 mg (02/16/2020), 4,200 mg (03/08/2020), 4,200 mg (03/29/2020)  for chemotherapy treatment.      ALLERGIES:  has No Known Allergies.  MEDICATIONS:  Current Outpatient Medications  Medication Sig Dispense Refill  acetaminophen (TYLENOL) 500 MG tablet Take 500 mg by mouth every 6 (six) hours as needed for mild pain or moderate pain.     aspirin 81 MG tablet Take 81 mg by mouth daily.      atorvastatin (LIPITOR) 40 MG tablet Take 1 tablet (40 mg total) by mouth daily. 90 tablet 1   clopidogrel (PLAVIX) 75 MG tablet Take 1 tablet (75 mg total) by mouth daily. 90 tablet 3   diphenoxylate-atropine (LOMOTIL) 2.5-0.025 MG tablet Take 2 tablets by mouth 4 (four) times daily as needed for diarrhea or loose stools. 30  tablet 0   feeding supplement, ENSURE ENLIVE, (ENSURE ENLIVE) LIQD Take 237 mLs by mouth 2 (two) times daily between meals. 237 mL 12   levofloxacin (LEVAQUIN) 750 MG tablet Take 1 tablet (750 mg total) by mouth daily. 7 tablet 0   levothyroxine (SYNTHROID) 50 MCG tablet Take 1 tablet (50 mcg total) by mouth daily. 90 tablet 1   loperamide (IMODIUM A-D) 2 MG tablet Take 2 mg by mouth 4 (four) times daily as needed for diarrhea or loose stools.      metoprolol succinate (TOPROL-XL) 25 MG 24 hr tablet Take 0.5 tablets (12.5 mg total) by mouth daily. 90 tablet 1   multivitamin-iron-minerals-folic acid (CENTRUM) chewable tablet Chew 1 tablet by mouth daily.     nitroGLYCERIN (NITROSTAT) 0.4 MG SL tablet Place 1 tablet under the tongue every 5 (five) minutes as needed for chest pain.   98   ondansetron (ZOFRAN) 8 MG tablet Take 1 tablet (8 mg total) by mouth 2 (two) times daily as needed for refractory nausea / vomiting. Start on day 3 after chemotherapy. 60 tablet 2   pantoprazole (PROTONIX) 20 MG tablet Take 20 mg by mouth daily.     Probiotic Product (Emma) CAPS Take 1 capsule by mouth in the morning.     prochlorperazine (COMPAZINE) 10 MG tablet Take 1 tablet (10 mg total) by mouth every 6 (six) hours as needed (Nausea or vomiting). 60 tablet 2   No current facility-administered medications for this visit.    VITAL SIGNS: There were no vitals taken for this visit. There were no vitals filed for this visit.  Estimated body mass index is 19.05 kg/m as calculated from the following:   Height as of 05/21/20: 5\' 9"  (1.753 m).   Weight as of 06/07/20: 129 lb (58.5 kg).  LABS: CBC:    Component Value Date/Time   WBC 5.2 06/07/2020 1118   WBC 5.7 05/24/2020 0705   HGB 8.7 (L) 06/07/2020 1118   HCT 27.9 (L) 06/07/2020 1118   PLT 141 (L) 06/07/2020 1118   MCV 97 06/07/2020 1118   NEUTROABS 4.1 06/07/2020 1118   LYMPHSABS 0.6 (L) 06/07/2020 1118   MONOABS 0.5  05/21/2020 2121   EOSABS 0.1 06/07/2020 1118   BASOSABS 0.0 06/07/2020 1118   Comprehensive Metabolic Panel:    Component Value Date/Time   NA 141 06/07/2020 1118   K 4.5 06/07/2020 1118   CL 102 06/07/2020 1118   CO2 25 06/07/2020 1118   BUN 18 06/07/2020 1118   CREATININE 1.28 (H) 06/07/2020 1118   GLUCOSE 89 06/07/2020 1118   GLUCOSE 95 05/24/2020 0705   CALCIUM 8.6 06/07/2020 1118   AST 43 (H) 06/07/2020 1118   AST 31 03/15/2019 1022   ALT 35 06/07/2020 1118   ALT 28 03/15/2019 1022   ALKPHOS 72 06/07/2020 1118   BILITOT 0.4 06/07/2020 1118   PROT 7.6  06/07/2020 1118   ALBUMIN 3.2 (L) 06/07/2020 1118    RADIOGRAPHIC STUDIES: DG Chest 2 View  Result Date: 06/07/2020 CLINICAL DATA:  History of tobacco use with recent pneumonia, follow-up exam EXAM: CHEST - 2 VIEW COMPARISON:  05/21/2020 FINDINGS: Cardiac shadow is stable. Right chest wall port is again seen and stable. The left lung remains clear. Improving aeration in the right lung is noted although persistent opacities are seen. No bony abnormality is noted. IMPRESSION: Persistent but improving infiltrates in the right lung. Electronically Signed   By: Inez Catalina M.D.   On: 06/07/2020 22:56    PERFORMANCE STATUS (ECOG) : 1 - Symptomatic but completely ambulatory  Review of Systems Unless otherwise noted, a complete review of systems is negative.  Physical Exam General: NAD Pulmonary: Unlabored Extremities: no edema, no joint deformities Skin: no rashes Neurological: Weakness but otherwise nonfocal  IMPRESSION: Patient was seen in the infusion area.  He says that since returning home last month, he has mostly returned to his baseline.  He feels like he is doing reasonably well.  He is getting around the house okay without reported falls.  He is able to provide for his own ADLs.  His appetite is still chronically poor.  Although, his weight is up slightly over the past 2 months.  He says his goal is to gain weight.   He is drinking Ensure Plus once or twice daily.  I suggested he increase it to twice or 3 times daily.  No symptomatic complaints today.  No acute changes or concerns reported.  PLAN: -Continue current scope of treatment -RTC in 1 month   Patient expressed understanding and was in agreement with this plan. He also understands that He can call the clinic at any time with any questions, concerns, or complaints.     Time Total: 15 minutes  Visit consisted of counseling and education dealing with the complex and emotionally intense issues of symptom management and palliative care in the setting of serious and potentially life-threatening illness.Greater than 50%  of this time was spent counseling and coordinating care related to the above assessment and plan.  Signed by: Altha Harm, PhD, NP-C

## 2020-06-28 ENCOUNTER — Other Ambulatory Visit: Payer: Self-pay

## 2020-06-28 ENCOUNTER — Ambulatory Visit (INDEPENDENT_AMBULATORY_CARE_PROVIDER_SITE_OTHER): Payer: Medicare Other | Admitting: Family Medicine

## 2020-06-28 ENCOUNTER — Encounter: Payer: Self-pay | Admitting: Family Medicine

## 2020-06-28 VITALS — BP 83/47 | HR 69 | Temp 97.7°F | Wt 129.4 lb

## 2020-06-28 DIAGNOSIS — I2510 Atherosclerotic heart disease of native coronary artery without angina pectoris: Secondary | ICD-10-CM | POA: Diagnosis not present

## 2020-06-28 DIAGNOSIS — I2583 Coronary atherosclerosis due to lipid rich plaque: Secondary | ICD-10-CM

## 2020-06-28 DIAGNOSIS — I959 Hypotension, unspecified: Secondary | ICD-10-CM

## 2020-06-28 DIAGNOSIS — J189 Pneumonia, unspecified organism: Secondary | ICD-10-CM

## 2020-06-28 NOTE — Assessment & Plan Note (Signed)
Still running really low. He is asymptomatic right now. If he starts getting dizzy, he will stop metoprolol. Liberalize salt. Push fluids. Call with any concerns. Continue to monitor.

## 2020-06-28 NOTE — Progress Notes (Signed)
BP (!) 83/47 (BP Location: Left Arm, Cuff Size: Small)   Pulse 69   Temp 97.7 F (36.5 C) (Oral)   Wt 129 lb 6.4 oz (58.7 kg)   SpO2 100%   BMI 19.11 kg/m    Subjective:    Patient ID: Corey Perry, male    DOB: 05/28/1946, 74 y.o.   MRN: [id number]  HPI: Corey Perry is a 74 y.o. male  Chief Complaint  Patient presents with  . Hypotension    follow up  . Pneumonia    follow up    Breathing is doing much better. He notes he's still coughing things up. He is not having any fevers. He is doing much better.   HYPOTENSION- had 1 of his nitro about 3 days ago. Had 1 episode of chest pain, but feeling better now.  BP monitoring frequency:  a few times a week Aspirin: yes Recurrent headaches: no Visual changes: no Palpitations: no Dyspnea: no Chest pain: yes Lower extremity edema: no Dizzy/lightheaded: no  Relevant past medical, surgical, family and social history reviewed and updated as indicated. Interim medical history since our last visit reviewed. Allergies and medications reviewed and updated.  Review of Systems  Constitutional: Negative.   Respiratory: Negative.   Cardiovascular: Positive for chest pain. Negative for palpitations and leg swelling.  Gastrointestinal: Negative.   Genitourinary: Negative.   Musculoskeletal: Negative.   Neurological: Negative.   Psychiatric/Behavioral: Negative.     Per HPI unless specifically indicated above     Objective:    BP (!) 83/47 (BP Location: Left Arm, Cuff Size: Small)   Pulse 69   Temp 97.7 F (36.5 C) (Oral)   Wt 129 lb 6.4 oz (58.7 kg)   SpO2 100%   BMI 19.11 kg/m   Wt Readings from Last 3 Encounters:  06/28/20 129 lb 6.4 oz (58.7 kg)  06/07/20 129 lb (58.5 kg)  05/21/20 125 lb (56.7 kg)    Physical Exam Vitals and nursing note reviewed.  Constitutional:      General: He is not in acute distress.    Appearance: Normal appearance. He is not ill-appearing, toxic-appearing or diaphoretic.    HENT:     Head: Normocephalic and atraumatic.     Right Ear: External ear normal.     Left Ear: External ear normal.     Nose: Nose normal.     Mouth/Throat:     Mouth: Mucous membranes are moist.     Pharynx: Oropharynx is clear.  Eyes:     General: No scleral icterus.       Right eye: No discharge.        Left eye: No discharge.     Extraocular Movements: Extraocular movements intact.     Conjunctiva/sclera: Conjunctivae normal.     Pupils: Pupils are equal, round, and reactive to light.  Cardiovascular:     Rate and Rhythm: Normal rate and regular rhythm.     Pulses: Normal pulses.     Heart sounds: Normal heart sounds. No murmur heard.  No friction rub. No gallop.   Pulmonary:     Effort: Pulmonary effort is normal. No respiratory distress.     Breath sounds: Normal breath sounds. No stridor. No wheezing, rhonchi or rales.  Chest:     Chest wall: No tenderness.  Musculoskeletal:        General: Normal range of motion.     Cervical back: Normal range of motion and neck supple.  Skin:  General: Skin is warm and dry.     Capillary Refill: Capillary refill takes less than 2 seconds.     Coloration: Skin is not jaundiced or pale.     Findings: No bruising, erythema, lesion or rash.  Neurological:     General: No focal deficit present.     Mental Status: He is alert and oriented to person, place, and time. Mental status is at baseline.  Psychiatric:        Mood and Affect: Mood normal.        Behavior: Behavior normal.        Thought Content: Thought content normal.        Judgment: Judgment normal.     Results for orders placed or performed in visit on 06/07/20  CBC with Differential/Platelet  Result Value Ref Range   WBC 5.2 3.4 - 10.8 x10E3/uL   RBC 2.88 (L) 4.14 - 5.80 x10E6/uL   Hemoglobin 8.7 (L) 13.0 - 17.7 g/dL   Hematocrit 27.9 (L) 37.5 - 51.0 %   MCV 97 79 - 97 fL   MCH 30.2 26.6 - 33.0 pg   MCHC 31.2 (L) 31 - 35 g/dL   RDW 16.3 (H) 11.6 - 15.4 %    Platelets 141 (L) 150 - 450 x10E3/uL   Neutrophils 80 Not Estab. %   Lymphs 11 Not Estab. %   Monocytes 8 Not Estab. %   Eos 1 Not Estab. %   Basos 0 Not Estab. %   Neutrophils Absolute 4.1 1 - 7 x10E3/uL   Lymphocytes Absolute 0.6 (L) 0 - 3 x10E3/uL   Monocytes Absolute 0.4 0 - 0 x10E3/uL   EOS (ABSOLUTE) 0.1 0.0 - 0.4 x10E3/uL   Basophils Absolute 0.0 0 - 0 x10E3/uL   Immature Granulocytes 0 Not Estab. %   Immature Grans (Abs) 0.0 0.0 - 0.1 x10E3/uL  Comprehensive metabolic panel  Result Value Ref Range   Glucose 89 65 - 99 mg/dL   BUN 18 8 - 27 mg/dL   Creatinine, Ser 1.28 (H) 0.76 - 1.27 mg/dL   GFR calc non Af Amer 55 (L) >59 mL/min/1.73   GFR calc Af Amer 64 >59 mL/min/1.73   BUN/Creatinine Ratio 14 10 - 24   Sodium 141 134 - 144 mmol/L   Potassium 4.5 3.5 - 5.2 mmol/L   Chloride 102 96 - 106 mmol/L   CO2 25 20 - 29 mmol/L   Calcium 8.6 8.6 - 10.2 mg/dL   Total Protein 7.6 6.0 - 8.5 g/dL   Albumin 3.2 (L) 3.7 - 4.7 g/dL   Globulin, Total 4.4 1.5 - 4.5 g/dL   Albumin/Globulin Ratio 0.7 (L) 1.2 - 2.2   Bilirubin Total 0.4 0.0 - 1.2 mg/dL   Alkaline Phosphatase 72 44 - 121 IU/L   AST 43 (H) 0 - 40 IU/L   ALT 35 0 - 44 IU/L  Magnesium  Result Value Ref Range   Magnesium 2.1 1.6 - 2.3 mg/dL  Phosphorus  Result Value Ref Range   Phosphorus 3.3 2.8 - 4.1 mg/dL      Assessment & Plan:   Problem List Items Addressed This Visit      Cardiovascular and Mediastinum   Hypotension    Still running really low. He is asymptomatic right now. If he starts getting dizzy, he will stop metoprolol. Liberalize salt. Push fluids. Call with any concerns. Continue to monitor.         Respiratory   Multifocal pneumonia - Primary  Feeling much better. Lungs clear. Will repeat CXR next week. Continue to monitor. Call with any concerns.       Relevant Orders   DG Chest 2 View       Follow up plan: Return in about 8 weeks (around 08/23/2020).

## 2020-06-28 NOTE — Assessment & Plan Note (Signed)
Feeling much better. Lungs clear. Will repeat CXR next week. Continue to monitor. Call with any concerns.

## 2020-07-06 ENCOUNTER — Telehealth: Payer: Self-pay

## 2020-07-06 NOTE — Telephone Encounter (Signed)
Called and LVM asking for patient's wife to please return my call.

## 2020-07-06 NOTE — Telephone Encounter (Signed)
Patient's wife returned call and was notified of Dr. Durenda Age message.

## 2020-07-06 NOTE — Telephone Encounter (Signed)
Pt's wife diane called in stating that pt is still coughing and vomiting, I offered virtual appt for tomorrow she is wanting to know if rx that was sent in at his last visit can be sent in again. She states that he had a 7 day supply but doesn't have anymore.

## 2020-07-06 NOTE — Telephone Encounter (Signed)
Please have him go get his x-ray. Order is already in

## 2020-07-10 ENCOUNTER — Ambulatory Visit
Admission: RE | Admit: 2020-07-10 | Discharge: 2020-07-10 | Disposition: A | Discharge status: Home / Self Care | Payer: Medicare Other | Source: Ambulatory Visit | Attending: Family Medicine | Admitting: Family Medicine

## 2020-07-10 ENCOUNTER — Other Ambulatory Visit: Payer: Self-pay | Admitting: Family Medicine

## 2020-07-10 ENCOUNTER — Other Ambulatory Visit: Payer: Self-pay

## 2020-07-10 ENCOUNTER — Ambulatory Visit
Admission: RE | Admit: 2020-07-10 | Discharge: 2020-07-10 | Disposition: A | Discharge status: Home / Self Care | Payer: Medicare Other | Attending: Family Medicine | Admitting: Family Medicine

## 2020-07-10 ENCOUNTER — Telehealth: Payer: Self-pay | Admitting: *Deleted

## 2020-07-10 DIAGNOSIS — J189 Pneumonia, unspecified organism: Secondary | ICD-10-CM | POA: Insufficient documentation

## 2020-07-10 DIAGNOSIS — Z1152 Encounter for screening for COVID-19: Secondary | ICD-10-CM

## 2020-07-10 DIAGNOSIS — J90 Pleural effusion, not elsewhere classified: Secondary | ICD-10-CM | POA: Diagnosis not present

## 2020-07-10 MED ORDER — LEVOFLOXACIN 750 MG PO TABS
750.0000 mg | ORAL_TABLET | Freq: Every day | ORAL | 0 refills | Status: DC
Start: 2020-07-10 — End: 2020-07-25

## 2020-07-10 NOTE — Telephone Encounter (Signed)
Chest xray results available in Epic. Routing to provider at this time as high priority.

## 2020-07-10 NOTE — Telephone Encounter (Signed)
Already seen and addressed- see result note.

## 2020-07-11 ENCOUNTER — Other Ambulatory Visit: Payer: Medicare Other

## 2020-07-11 DIAGNOSIS — Z1152 Encounter for screening for COVID-19: Secondary | ICD-10-CM | POA: Diagnosis not present

## 2020-07-12 LAB — SARS-COV-2, NAA 2 DAY TAT

## 2020-07-12 LAB — NOVEL CORONAVIRUS, NAA: SARS-CoV-2, NAA: NOT DETECTED

## 2020-07-13 ENCOUNTER — Other Ambulatory Visit: Payer: Self-pay | Admitting: *Deleted

## 2020-07-13 ENCOUNTER — Telehealth: Payer: Self-pay

## 2020-07-13 ENCOUNTER — Encounter: Payer: Self-pay | Admitting: Infectious Diseases

## 2020-07-13 ENCOUNTER — Other Ambulatory Visit: Payer: Self-pay

## 2020-07-13 ENCOUNTER — Telehealth: Payer: Self-pay | Admitting: Family Medicine

## 2020-07-13 ENCOUNTER — Ambulatory Visit: Payer: Medicare Other | Attending: Infectious Diseases | Admitting: Infectious Diseases

## 2020-07-13 VITALS — BP 88/60 | HR 84 | Temp 98.1°F | Resp 16 | Ht 69.0 in | Wt 129.0 lb

## 2020-07-13 DIAGNOSIS — I2510 Atherosclerotic heart disease of native coronary artery without angina pectoris: Secondary | ICD-10-CM | POA: Insufficient documentation

## 2020-07-13 DIAGNOSIS — R0602 Shortness of breath: Secondary | ICD-10-CM | POA: Insufficient documentation

## 2020-07-13 DIAGNOSIS — Z859 Personal history of malignant neoplasm, unspecified: Secondary | ICD-10-CM | POA: Insufficient documentation

## 2020-07-13 DIAGNOSIS — Z7902 Long term (current) use of antithrombotics/antiplatelets: Secondary | ICD-10-CM | POA: Diagnosis not present

## 2020-07-13 DIAGNOSIS — Z79899 Other long term (current) drug therapy: Secondary | ICD-10-CM | POA: Insufficient documentation

## 2020-07-13 DIAGNOSIS — Z955 Presence of coronary angioplasty implant and graft: Secondary | ICD-10-CM | POA: Diagnosis not present

## 2020-07-13 DIAGNOSIS — J189 Pneumonia, unspecified organism: Secondary | ICD-10-CM | POA: Diagnosis not present

## 2020-07-13 DIAGNOSIS — I10 Essential (primary) hypertension: Secondary | ICD-10-CM | POA: Diagnosis not present

## 2020-07-13 DIAGNOSIS — C155 Malignant neoplasm of lower third of esophagus: Secondary | ICD-10-CM | POA: Diagnosis not present

## 2020-07-13 DIAGNOSIS — D649 Anemia, unspecified: Secondary | ICD-10-CM | POA: Diagnosis not present

## 2020-07-13 DIAGNOSIS — Z87891 Personal history of nicotine dependence: Secondary | ICD-10-CM | POA: Insufficient documentation

## 2020-07-13 DIAGNOSIS — J690 Pneumonitis due to inhalation of food and vomit: Secondary | ICD-10-CM

## 2020-07-13 DIAGNOSIS — Z5111 Encounter for antineoplastic chemotherapy: Secondary | ICD-10-CM | POA: Diagnosis not present

## 2020-07-13 DIAGNOSIS — R1310 Dysphagia, unspecified: Secondary | ICD-10-CM

## 2020-07-13 MED ORDER — AMOXICILLIN-POT CLAVULANATE 875-125 MG PO TABS: 1.0000 | ORAL_TABLET | Freq: Two times a day (BID) | ORAL | 0 refills | Status: DC

## 2020-07-13 NOTE — Telephone Encounter (Signed)
Copied from [place] [id number]. Topic: General - Inquiry >> Jul 13, 2020 10:16 AM Lennox Solders wrote: Reason for CRM: York Cerise with dr Delaine Lame office is calling and would like dr Wynetta Emery to return the call asap. York Cerise does not know the reason for the call just that the doctor told her to call and pt was referred by dr Wynetta Emery.

## 2020-07-13 NOTE — Progress Notes (Signed)
NAME: Corey Perry  DOB: 11-Feb-1946  MRN: [id number]  Date/Time: 07/13/2020 9:47 AM  REQUESTING PROVIDER Subjective:  REASON FOR CONSULT:  ? CAMBRIDGE DELEO is a 74 y.o. with a history of recurrent adenocarcinoma of the esophagus status post chemotherapy/XRT in 2017 with esophagectomy on 10/07/2016. Patient has history of dysphagia with esophageal stricture requiring dilation. He has had recent worsening dysphagia with weight loss. Patient was found to have recurrent adenocarcinoma in the lower third of the esophagus now s/p FOLFOX on active surveillance.   Patient was hospitalized 05/21/2020 -05/24/2020 with sepsis from multifocal pneumonia.    Patient has had intermittently shortness of breath and repeat chest x-ray done recently by his primary provider demonstrated multifocal pneumonia bilaterally more so on the right side and the patient is referred to me. Patient says he has had trouble swallowing food and water.  He feels the food hangs in his chest for a long period of time and he has to throw up after that.  He does not induce vomiting.  Patient states yesterday he had barbecue and ate some meat and had to throw up after a few hours.  Later in the night he had some chills and right-sided chest pain which was pleuritic in nature.  He took a Tylenol and is feeling much better this morning.  In my office he is here with his son. He is followed by Dr. Abigail Butts gastrointestinal surgery at [facility].  He last had a endoscopy on 08/06/2019.  The surgeon's note indicates that the flexible esophagoscopy was performed and the scope could not pass what appears to be an extrinsic compression at 15 cm.  A flexible guidewire was then passed and serial dilatation was performed from 77 [place] to 36 [place] and the guidewire and dilator were removed.  Biopsies were taken.  And that revealed a malignancy and patient was started treatment by Dr. [name]. Prior to that patient has had EGD with pyloric dilatation and  esophageal dilatation in 2019 and in February 2020.  Past Medical History:  Diagnosis Date  . Abnormal white blood cell (WBC) count    seeing Dr. [name] at [place] on 10/25  . Anemia   . Atrial fibrillation ([place])   . BPH (benign prostatic hypertrophy)   . Bradycardia   . CAD (coronary artery disease)   . Dysrhythmia    bradycardia - sees Dr. Humphrey Rolls  . Esophageal cancer ([place]) 05/2016  . GERD (gastroesophageal reflux disease)   . History of kidney stones   . Hyperlipidemia   . Hypertension   . Hypothyroidism   . Kidney stone   . Myocardial infarction ([place]) 2005  . Pneumonia   . Pneumonia 06/22/2020  . S/P angioplasty with stent 2005   x 4 vessels  . Sleep apnea    mild-NO CPAP  . Spinal stenosis    lumbar    Past Surgical History:  Procedure Laterality Date  . BACK SURGERY    . CARDIAC CATHETERIZATION Left 03/12/2016   Procedure: Left Heart Cath and Coronary Angiography;  Surgeon: [name], MD;  Location: [place] [facility];  Service: Cardiovascular;  Laterality: Left;  . CARDIAC CATHETERIZATION N/A 03/12/2016   Procedure: Coronary Stent Intervention;  Surgeon: Yolonda Kida, MD;  Location: [place] [facility];  Service: Cardiovascular;  Laterality: N/A;  . COLONOSCOPY WITH PROPOFOL N/A 07/13/2015   Procedure: COLONOSCOPY WITH PROPOFOL;  Surgeon: Lucilla Lame, MD;  Location: [place];  Service: Endoscopy;  Laterality: N/A;  .  CORONARY ANGIOPLASTY  10/05 and 12/05   4 DES placed  . CYSTOSCOPY W/ RETROGRADES Left 06/15/2019   Procedure: CYSTOSCOPY WITH RETROGRADE PYELOGRAM;  Surgeon: Abbie Sons, MD;  Location: [facility];  Service: Urology;  Laterality: Left;  . CYSTOSCOPY/URETEROSCOPY/HOLMIUM LASER/STENT PLACEMENT Left 06/15/2019   Procedure: CYSTOSCOPY/URETEROSCOPY/HOLMIUM LASER/STENT PLACEMENT;  Surgeon: Abbie Sons, MD;  Location: [facility];  Service: Urology;  Laterality: Left;  . ESOPHAGECTOMY  10/07/2016   @ [facility]  .  ESOPHAGOGASTRODUODENOSCOPY (EGD) WITH PROPOFOL N/A 06/14/2016   Procedure: ESOPHAGOGASTRODUODENOSCOPY (EGD) WITH PROPOFOL;  Surgeon: [name], MD;  Location: [facility] ENDOSCOPY;  Service: Endoscopy;  Laterality: N/A;  . HERNIA REPAIR Bilateral    x3  . INGUINAL HERNIA REPAIR Right 08/28/2017   Large PerFix plug, recurrent hernia;  Surgeon: Robert Bellow, MD;  Location: [facility];  Service: General;  Laterality: Right;  recurrent  . KIDNEY STONE SURGERY  07/15/2019  . POLYPECTOMY  07/13/2015   Procedure: POLYPECTOMY;  Surgeon: Lucilla Lame, MD;  Location: [place];  Service: Endoscopy;;  . PORT-A-CATH REMOVAL N/A 01/16/2017   Procedure: REMOVAL PORT-A-CATH;  Surgeon: Olean Ree, MD;  Location: [facility];  Service: General;  Laterality: N/A;  . PORTACATH PLACEMENT N/A 07/08/2016   Procedure: INSERTION PORT-A-CATH;  Surgeon: Olean Ree, MD;  Location: [facility];  Service: General;  Laterality: N/A;  . PORTACATH PLACEMENT Right 08/30/2019   Procedure: INSERTION PORT-A-CATH;  Surgeon: Olean Ree, MD;  Location: [facility];  Service: General;  Laterality: Right;    Social History   Socioeconomic History  . Marital status: Married    Spouse name: Not on file  . Number of children: Not on file  . Years of education: Not on file  . Highest education level: 9th grade  Occupational History  . Not on file  Tobacco Use  . Smoking status: Former Smoker    Packs/day: 1.00    Years: 40.00    Pack years: 40.00    Types: Cigarettes    Quit date: 07/16/2004    Years since quitting: 16.0  . Smokeless tobacco: Former Systems developer    Types: Secondary school teacher  . Vaping Use: Never used  Substance and Sexual Activity  . Alcohol use: Yes    Alcohol/week: 1.0 standard drink    Types: 1 Cans of beer per week  . Drug use: No  . Sexual activity: Yes    Birth control/protection: None  Other Topics Concern  . Not on file  Social History Narrative  . Not on file   Social Determinants of  Health   Financial Resource Strain:   . Difficulty of Paying Living Expenses: Not on file  Food Insecurity:   . Worried About Charity fundraiser in the Last Year: Not on file  . Ran Out of Food in the Last Year: Not on file  Transportation Needs:   . Lack of Transportation (Medical): Not on file  . Lack of Transportation (Non-Medical): Not on file  Physical Activity:   . Days of Exercise per Week: Not on file  . Minutes of Exercise per Session: Not on file  Stress:   . Feeling of Stress : Not on file  Social Connections:   . Frequency of Communication with Friends and Family: Not on file  . Frequency of Social Gatherings with Friends and Family: Not on file  . Attends Religious Services: Not on file  . Active Member of Clubs or Organizations: Not on file  . Attends  Club or Organization Meetings: Not on file  . Marital Status: Not on file  Intimate Partner Violence:   . Fear of Current or Ex-Partner: Not on file  . Emotionally Abused: Not on file  . Physically Abused: Not on file  . Sexually Abused: Not on file    Family History  Problem Relation Age of Onset  . Hypertension Mother   . Dementia Mother   . Heart disease Father   . Hypertension Father   . Kidney disease Father   . Hypertension Brother   . Heart disease Brother   . Diabetes Son   . Hypertension Son   . Hyperlipidemia Son   . Hyperlipidemia Daughter   . Hypertension Daughter    No Known Allergies  Current Outpatient Medications  Medication Sig Dispense Refill  . acetaminophen (TYLENOL) 500 MG tablet Take 500 mg by mouth every 6 (six) hours as needed for mild pain or moderate pain.    Marland Kitchen aspirin 81 MG tablet Take 81 mg by mouth daily.     Marland Kitchen atorvastatin (LIPITOR) 40 MG tablet Take 1 tablet (40 mg total) by mouth daily. 90 tablet 1  . clopidogrel (PLAVIX) 75 MG tablet Take 1 tablet (75 mg total) by mouth daily. 90 tablet 3  . diphenoxylate-atropine (LOMOTIL) 2.5-0.025 MG tablet Take 2 tablets by mouth 4  (four) times daily as needed for diarrhea or loose stools. 30 tablet 0  . feeding supplement, ENSURE ENLIVE, (ENSURE ENLIVE) LIQD Take 237 mLs by mouth 2 (two) times daily between meals. 237 mL 12  . levofloxacin (LEVAQUIN) 750 MG tablet Take 1 tablet (750 mg total) by mouth daily. 10 tablet 0  . levothyroxine (SYNTHROID) 50 MCG tablet Take 1 tablet (50 mcg total) by mouth daily. 90 tablet 1  . loperamide (IMODIUM A-D) 2 MG tablet Take 2 mg by mouth 4 (four) times daily as needed for diarrhea or loose stools.     . metoprolol succinate (TOPROL-XL) 25 MG 24 hr tablet Take 0.5 tablets (12.5 mg total) by mouth daily. 90 tablet 1  . multivitamin-iron-minerals-folic acid (CENTRUM) chewable tablet Chew 1 tablet by mouth daily.    . nitroGLYCERIN (NITROSTAT) 0.4 MG SL tablet Place 1 tablet under the tongue every 5 (five) minutes as needed for chest pain.   98  . ondansetron (ZOFRAN) 8 MG tablet Take 1 tablet (8 mg total) by mouth 2 (two) times daily as needed for refractory nausea / vomiting. Start on day 3 after chemotherapy. 60 tablet 2  . pantoprazole (PROTONIX) 20 MG tablet Take 20 mg by mouth daily.    . Probiotic Product ([place]) CAPS Take 1 capsule by mouth in the morning.    . prochlorperazine (COMPAZINE) 10 MG tablet Take 1 tablet (10 mg total) by mouth every 6 (six) hours as needed (Nausea or vomiting). 60 tablet 2   No current facility-administered medications for this visit.     Abtx:  Anti-infectives (From admission, onward)   None      REVIEW OF SYSTEMS:  Const:  fever,  chills,  weight loss Eyes: negative diplopia or visual changes, negative eye pain ENT: negative coryza, negative sore throat Resp: cough, , dyspnea Cards: Pleuritic chest pain, no palpitations, lower extremity edema GU: negative for frequency, dysuria and hematuria GI: Negative for abdominal pain, diarrhea, bleeding, constipation Skin: negative for rash and pruritus Heme: negative for easy  bruising and gum/nose bleeding MS: Generalized weakness Neurolo:dizziness,   Psych: negative for feelings of anxiety, depression  Endocrine: negative for thyroid, diabetes Allergy/Immunology-no allergies  Objective:  VITALS:  BP (!) 88/60   Pulse 84   Temp 98.1 F (36.7 C)   Resp 16   Ht 5\' 9"  (1.753 m)   Wt 129 lb (58.5 kg)   SpO2 (!) 89%   BMI 19.05 kg/m  PHYSICAL EXAM:  General: Alert, cooperative, no distress at distress,thin- Pulse ox at rest 88-90%  Head: Normocephalic, without obvious abnormality, atraumatic. Eyes: Conjunctivae clear, anicteric sclerae. Pupils are equal ENT Nares normal. No drainage or sinus tenderness. Lips, mucosa, and tongue normal. No Thrush Neck: Supple, . Back: No CVA tenderness. Lungs: b/l air entry crepts bases , rhonchi rt side Heart: s1s2 PORT chest wall Abdomen: Soft, non-tender,not distended. Bowel sounds normal. No masses Extremities: atraumatic, no cyanosis. No edema. No clubbing Skin: limited examination Lymph: Cervical, supraclavicular normal. Neurologic: Grossly non-focal Pertinent Labs Lab Results CBC    Component Value Date/Time   WBC 5.2 06/07/2020 1118   WBC 5.7 05/24/2020 0705   RBC 2.88 (L) 06/07/2020 1118   RBC 2.40 (L) 05/24/2020 0705   HGB 8.7 (L) 06/07/2020 1118   HCT 27.9 (L) 06/07/2020 1118   PLT 141 (L) 06/07/2020 1118   MCV 97 06/07/2020 1118   MCH 30.2 06/07/2020 1118   MCH 30.8 05/24/2020 0705   MCHC 31.2 (L) 06/07/2020 1118   MCHC 31.0 05/24/2020 0705   RDW 16.3 (H) 06/07/2020 1118   LYMPHSABS 0.6 (L) 06/07/2020 1118   MONOABS 0.5 05/21/2020 2121   EOSABS 0.1 06/07/2020 1118   BASOSABS 0.0 06/07/2020 1118    CMP Latest Ref Rng & Units 06/07/2020 05/24/2020 05/23/2020  Glucose 65 - 99 mg/dL 89 95 90  BUN 8 - 27 mg/dL 18 19 24(H)  Creatinine 0.76 - 1.27 mg/dL 1.28(H) 1.21 1.35(H)  Sodium 134 - 144 mmol/L 141 131(L) 129(L)  Potassium 3.5 - 5.2 mmol/L 4.5 3.9 4.5  Chloride 96 - 106 mmol/L 102 96(L) 95(L)    CO2 20 - 29 mmol/L 25 27 27   Calcium 8.6 - 10.2 mg/dL 8.6 8.2(L) 8.2(L)  Total Protein 6.0 - 8.5 g/dL 7.6 - -  Total Bilirubin 0.0 - 1.2 mg/dL 0.4 - -  Alkaline Phos 44 - 121 IU/L 72 - -  AST 0 - 40 IU/L 43(H) - -  ALT 0 - 44 IU/L 35 - -      Microbiology: Recent Results (from the past 240 hour(s))  Novel Coronavirus, NAA ([facility])     Status: None   Collection Time: 07/11/20  4:37 PM   Specimen: Saline  Result Value Ref Range Status   SARS-CoV-2, NAA Not Detected Not Detected Final    Comment: This nucleic acid amplification test was developed and its performance characteristics determined by [employer]. Nucleic acid amplification tests include RT-PCR and TMA. This test has not been FDA cleared or approved. This test has been authorized by FDA under an Emergency Use Authorization (EUA). This test is only authorized for the duration of time the declaration that circumstances exist justifying the authorization of the emergency use of in vitro diagnostic tests for detection of SARS-CoV-2 virus and/or diagnosis of COVID-19 infection under section 564(b)(1) of the Act, 21 U.S.C. 324MWN-0(U) (1), unless the authorization is terminated or revoked sooner. When diagnostic testing is negative, the possibility of a false negative result should be considered in the context of a patient's recent exposures and the presence of clinical signs and symptoms consistent with COVID-19. An individual without symptoms of COVID-19 and  who is not shedding SARS-CoV-2 virus wo uld expect to have a negative (not detected) result in this assay.   SARS-COV-2, NAA 2 DAY TAT     Status: None   Collection Time: 07/11/20  4:37 PM  Result Value Ref Range Status   SARS-CoV-2, NAA 2 DAY TAT Performed  Final    IMAGING RESULTS:  I have personally reviewed the films Nodular ground-glass infiltrates throughout the right lung with consolidation in the right lower lung. Patchy nodular infiltrates  in the left lower lung. Changes could be due to pneumonia, metastasis, or aspiration. This is progressing since previous PET-CT. 4. Moderately prominent lymph nodes in the mediastinum, nonspecific but could indicate metastatic disease. ?  Impression/Recommendation ? Multifocal pneumonia of the lungs right more than the left.  This is due to chronic aspiration.  DD is atypical mycobacteria or opportunistic infections like nocardia .   patient has recurrent esophageal carcinoma  He has had chemo in 2018 followed by radiation and surgery.  And in 2021 he took  ? ?He has dysphagia and vomiting.  He has a history of esophageal strictures.  He has had esophageal dilatation frequently in the past. He has to see his GI surgeon and undergo another endoscopy and possible esophageal dilatation. He would benefit from a pulmonary consultation especially if he does not respond to Augmentin and we need to get bronchoscopy. He will also likely need home oxygen for which he needs to see a pulmonologist. Today we will start him on Augmentin p.o. twice daily for 2 weeks.  Told him to measure his temperature and if he has a fever of more than 101 or he is short of breath  he needs to go to the emergency department. He is asked to not take any other antibiotic.  CAD status post stents  Hypertension.  Now he is hypotensive.  Recommend ruling out adrenal insufficiency.  He is on metoprolol and the dose may have to be adjusted  Anemia could be from his malignancy and malnutrition.  He is on Plavix. I spent 45 minutes son.  Also communicated with his PCP Dr. Wynetta Emery and oncologist and discussed the management plan and the next steps. Follow-up in 2 weeks. ___________________________________________________  Note:  This document was prepared using Systems analyst and may include unintentional dictation errors.

## 2020-07-13 NOTE — Telephone Encounter (Signed)
Routing to provider as high priority as another provider is requesting to speak to Dr. Wynetta Emery regarding this patient.

## 2020-07-13 NOTE — Telephone Encounter (Signed)
He can stop the Levaquin.

## 2020-07-13 NOTE — Patient Instructions (Signed)
You are here for multifocal pneumonia- This is due to the food /water getting into your lungs because of the esophageal cancer and possible stricture- I am giving you antibiotic- Augmentin twice a day for 14 days- you need to see the Gastroenterologist and lung doctoras well- I informed your PCP and your cancer doctor. Will see you back in 2 weeks

## 2020-07-13 NOTE — Telephone Encounter (Signed)
Copied from [place] [id number]. Topic: General - Other >> Jul 13, 2020 12:33 PM Yvette Rack wrote: Reason for CRM: Roselyn Reef with [name] called to report that they filled the Rx for levofloxacin (LEVAQUIN) 750 MG tablet a few days ago however the patient has a new Rx for amoxicillin-clavulanate (AUGMENTIN) 875-125 MG tablet from another provider. Roselyn Reef questions if patient should be taking both medications. Cb# [phone]

## 2020-07-13 NOTE — Telephone Encounter (Signed)
Discussed over secure chat. Referral to GI made and appt here to be scheduled ASAP. Patient is hypoxic

## 2020-07-13 NOTE — Telephone Encounter (Signed)
Please get him in for follow up with whoever has 1st availability ASAP. Thanks!

## 2020-07-13 NOTE — Telephone Encounter (Signed)
Called pt to schedule, no answer, left vm

## 2020-07-13 NOTE — Telephone Encounter (Signed)
Pharmacy notified.

## 2020-07-13 NOTE — Telephone Encounter (Signed)
Spoke to patients wife to verify that he is only taking the Augmentin prescribed today. Patient is not taking Levaquin or any other antibiotic at this time. Wife states PCP may have sent that in as a back up incase it took longer to get in to see ID clinic but we made a next day appt so they did not need to pick it up. I will message Zeric Baranowski and have her call and cancel that medication so not to confuse patient.

## 2020-07-14 ENCOUNTER — Telehealth: Payer: Self-pay

## 2020-07-14 NOTE — Telephone Encounter (Signed)
1015AM: Palliative care SW outreached patient/family for monthly telephonic visit.  Left HIPPA compliant VM awaiting return call.

## 2020-07-14 NOTE — Telephone Encounter (Signed)
Called pt spoke with wife [name] scheduled for 11/2

## 2020-07-18 ENCOUNTER — Encounter: Payer: Self-pay | Admitting: Family Medicine

## 2020-07-18 ENCOUNTER — Other Ambulatory Visit: Payer: Self-pay

## 2020-07-18 ENCOUNTER — Telehealth: Payer: Self-pay | Admitting: Family Medicine

## 2020-07-18 ENCOUNTER — Ambulatory Visit (INDEPENDENT_AMBULATORY_CARE_PROVIDER_SITE_OTHER): Payer: Medicare Other | Admitting: Family Medicine

## 2020-07-18 VITALS — BP 100/64 | HR 89 | Temp 97.7°F | Ht 69.0 in | Wt 121.0 lb

## 2020-07-18 DIAGNOSIS — J690 Pneumonitis due to inhalation of food and vomit: Secondary | ICD-10-CM

## 2020-07-18 DIAGNOSIS — R1310 Dysphagia, unspecified: Secondary | ICD-10-CM | POA: Diagnosis not present

## 2020-07-18 DIAGNOSIS — R634 Abnormal weight loss: Secondary | ICD-10-CM

## 2020-07-18 DIAGNOSIS — I2510 Atherosclerotic heart disease of native coronary artery without angina pectoris: Secondary | ICD-10-CM

## 2020-07-18 DIAGNOSIS — R0902 Hypoxemia: Secondary | ICD-10-CM | POA: Diagnosis not present

## 2020-07-18 DIAGNOSIS — I2583 Coronary atherosclerosis due to lipid rich plaque: Secondary | ICD-10-CM

## 2020-07-18 NOTE — Telephone Encounter (Signed)
Secure messaged with ID- does not need to follow up with her until after he has seen GI and pulmonary. He does not need to have an x-ray as he does not need to see her yet- please let patient know.   Can we please try to get him into see pulmonary and GI ASAP, he has not been answering the phone from pulmonary as he doesn't know the number- please call wife's cell (in the chart), he says she'll answer. OK to book on Thursday when he was going to see ID as he doesn't need to see them right now.   Thanks.

## 2020-07-18 NOTE — Progress Notes (Signed)
BP 100/64 (BP Location: Left Arm, Patient Position: Sitting)   Pulse 89   Temp 97.7 F (36.5 C) (Oral)   Ht 5\' 9"  (1.753 m)   Wt 121 lb (54.9 kg)   SpO2 (!) 84%   BMI 17.87 kg/m    Subjective:    Patient ID: Corey Perry, male    DOB: 1946-07-15, 74 y.o.   MRN: [id number]  HPI: TRISTON SKARE is a 74 y.o. male  Chief Complaint  Patient presents with  . Pneumonia    follow up - pt is still having SOB/ cant get o2 level pts hands are cold tried to warm them as well   Has not gotten in to see pulmonology or GI yet. Tolerating his augmentin well. He has been cutting back on how much he's eating because he's been trying to avoid any aspiration. Has lost 8 lbs in the last 2 weeks. He continues to cough up a significant amount of phlegm. No fevers. No chills. He is feeling short of breath. Is not able to do much. He continues to be tired and have issues with swallowing. No other concerns right now.   Relevant past medical, surgical, family and social history reviewed and updated as indicated. Interim medical history since our last visit reviewed. Allergies and medications reviewed and updated.  Review of Systems  Constitutional: Positive for fatigue and unexpected weight change. Negative for activity change, appetite change, chills, diaphoresis and fever.  HENT: Negative.   Respiratory: Positive for cough and choking. Negative for apnea, chest tightness, shortness of breath, wheezing and stridor.   Cardiovascular: Negative.   Gastrointestinal: Negative.   Neurological: Negative.   Psychiatric/Behavioral: Negative.     Per HPI unless specifically indicated above     Objective:    BP 100/64 (BP Location: Left Arm, Patient Position: Sitting)   Pulse 89   Temp 97.7 F (36.5 C) (Oral)   Ht 5\' 9"  (1.753 m)   Wt 121 lb (54.9 kg)   SpO2 (!) 84%   BMI 17.87 kg/m   Wt Readings from Last 3 Encounters:  07/18/20 121 lb (54.9 kg)  07/13/20 129 lb (58.5 kg)  06/28/20 129  lb 6.4 oz (58.7 kg)    Physical Exam Vitals and nursing note reviewed.  Constitutional:      General: He is not in acute distress.    Appearance: Normal appearance. He is cachectic. He is not ill-appearing, toxic-appearing or diaphoretic.  HENT:     Head: Normocephalic and atraumatic.     Right Ear: External ear normal.     Left Ear: External ear normal.     Nose: Nose normal.     Mouth/Throat:     Mouth: Mucous membranes are moist.     Pharynx: Oropharynx is clear.  Eyes:     General: No scleral icterus.       Right eye: No discharge.        Left eye: No discharge.     Extraocular Movements: Extraocular movements intact.     Conjunctiva/sclera: Conjunctivae normal.     Pupils: Pupils are equal, round, and reactive to light.  Cardiovascular:     Rate and Rhythm: Normal rate and regular rhythm.     Pulses: Normal pulses.     Heart sounds: Normal heart sounds. No murmur heard.  No friction rub. No gallop.   Pulmonary:     Effort: Pulmonary effort is normal. No respiratory distress.     Breath sounds: No stridor.  Rhonchi (RLL) present. No wheezing or rales.  Chest:     Chest wall: No tenderness.  Musculoskeletal:        General: Normal range of motion.     Cervical back: Normal range of motion and neck supple.  Skin:    General: Skin is warm and dry.     Capillary Refill: Capillary refill takes less than 2 seconds.     Coloration: Skin is not jaundiced or pale.     Findings: No bruising, erythema, lesion or rash.  Neurological:     General: No focal deficit present.     Mental Status: He is alert and oriented to person, place, and time. Mental status is at baseline.  Psychiatric:        Mood and Affect: Mood normal.        Behavior: Behavior normal.        Thought Content: Thought content normal.        Judgment: Judgment normal.     Results for orders placed or performed in visit on 07/11/20  Novel Coronavirus, NAA ([facility])   Specimen: Saline  Result Value Ref  Range   SARS-CoV-2, NAA Not Detected Not Detected  SARS-COV-2, NAA 2 DAY TAT  Result Value Ref Range   SARS-CoV-2, NAA 2 DAY TAT Performed       Assessment & Plan:   Problem List Items Addressed This Visit      Respiratory   Aspiration pneumonia of right middle lobe ([place])    Oxygen level 84%- will get him set up with O2 at home. Ordered today. Has not been answering the phone with pulmonology for appointment. He CAN go to [place]- we will get him scheduled ASAP and call wife's cell for appt. Will reach out to ID to see if they'd like a repeat CXR prior to his follow up with them on Thursday. Continue augmentin. Tolerating it well. Continue to monitor closely.      Hypoxia - Primary    Oxygen level 84%- will get him set up with O2 at home. Ordered today. Has not been answering the phone with pulmonology for appointment. He CAN go to [place]- we will get him scheduled ASAP and call wife's cell for appt. Will reach out to ID to see if they'd like a repeat CXR prior to his follow up with them on Thursday. Continue augmentin. Tolerating it well. Continue to monitor closely.       Other Visit Diagnoses    Weight loss       Has been cutting back on eating due to fear of aspiration. Will get him into GI ASAP. Continue to monitor very closely.   Dysphagia, unspecified type       Referral placed to GI- no word yet. Will check on this referral and try to get it set up ASAP.        Follow up plan: Return As scheduled.  >25 minutes spent with patient and coordination of care today.

## 2020-07-18 NOTE — Assessment & Plan Note (Signed)
Oxygen level 84%- will get him set up with O2 at home. Ordered today. Has not been answering the phone with pulmonology for appointment. He CAN go to [place]- we will get him scheduled ASAP and call wife's cell for appt. Will reach out to ID to see if they'd like a repeat CXR prior to his follow up with them on Thursday. Continue augmentin. Tolerating it well. Continue to monitor closely.

## 2020-07-18 NOTE — Assessment & Plan Note (Signed)
Oxygen level 84%- will get him set up with O2 at home. Ordered today. Has not been answering the phone with pulmonology for appointment. He CAN go to [place]- we will get him scheduled ASAP and call wife's cell for appt. Will reach out to ID to see if they'd like a repeat CXR prior to his follow up with them on Thursday. Continue augmentin. Tolerating it well. Continue to monitor closely.

## 2020-07-20 NOTE — Telephone Encounter (Signed)
Patient is scheduled at [facility] clinic tomorrow @ 10. I will follow back up with GI.

## 2020-07-20 NOTE — Telephone Encounter (Signed)
Called Dr [name] office and left a message to check status of referral. Waiting on a call back . Will check on this before end of day.   Called [facility] in [place] an scheduled patient for 08/18/2020. Called wife and she would like to go to [place]. She states that she will call and reschedule appointment today. I will check on this later today. Thanks!

## 2020-07-20 NOTE — Telephone Encounter (Signed)
Needs to see pulmonology before December. Please change location if they can't get him in prior to that. Really needs to be seen in the next 2 weeks.

## 2020-07-21 ENCOUNTER — Institutional Professional Consult (permissible substitution): Payer: Medicare Other | Admitting: Pulmonary Disease

## 2020-07-21 DIAGNOSIS — J90 Pleural effusion, not elsewhere classified: Secondary | ICD-10-CM | POA: Diagnosis not present

## 2020-07-21 DIAGNOSIS — R0600 Dyspnea, unspecified: Secondary | ICD-10-CM | POA: Diagnosis not present

## 2020-07-21 DIAGNOSIS — J439 Emphysema, unspecified: Secondary | ICD-10-CM | POA: Diagnosis not present

## 2020-07-21 DIAGNOSIS — J189 Pneumonia, unspecified organism: Secondary | ICD-10-CM | POA: Diagnosis not present

## 2020-07-21 DIAGNOSIS — R059 Cough, unspecified: Secondary | ICD-10-CM | POA: Diagnosis not present

## 2020-07-21 DIAGNOSIS — I517 Cardiomegaly: Secondary | ICD-10-CM | POA: Diagnosis not present

## 2020-07-21 DIAGNOSIS — G4734 Idiopathic sleep related nonobstructive alveolar hypoventilation: Secondary | ICD-10-CM | POA: Diagnosis not present

## 2020-07-24 NOTE — Telephone Encounter (Signed)
Spoke with GI provider and they did receive referral. He is a return patient so the process should go faster. Provider is reviewing referral.

## 2020-07-25 ENCOUNTER — Ambulatory Visit: Payer: Medicare Other | Attending: Infectious Diseases | Admitting: Infectious Diseases

## 2020-07-25 ENCOUNTER — Other Ambulatory Visit: Payer: Medicare Other

## 2020-07-25 ENCOUNTER — Ambulatory Visit
Admission: RE | Admit: 2020-07-25 | Discharge: 2020-07-25 | Disposition: A | Discharge status: Home / Self Care | Payer: Medicare Other | Source: Ambulatory Visit | Attending: Infectious Diseases | Admitting: Infectious Diseases

## 2020-07-25 ENCOUNTER — Ambulatory Visit
Admission: RE | Admit: 2020-07-25 | Discharge: 2020-07-25 | Disposition: A | Discharge status: Home / Self Care | Payer: Medicare Other | Source: Home / Self Care | Attending: Infectious Diseases | Admitting: Infectious Diseases

## 2020-07-25 ENCOUNTER — Other Ambulatory Visit: Payer: Self-pay

## 2020-07-25 ENCOUNTER — Encounter: Payer: Self-pay | Admitting: Infectious Diseases

## 2020-07-25 ENCOUNTER — Telehealth: Payer: Self-pay | Admitting: Family Medicine

## 2020-07-25 VITALS — BP 110/70 | HR 102 | Temp 98.0°F | Resp 16 | Ht 69.0 in | Wt 119.0 lb

## 2020-07-25 DIAGNOSIS — J189 Pneumonia, unspecified organism: Secondary | ICD-10-CM | POA: Insufficient documentation

## 2020-07-25 DIAGNOSIS — J690 Pneumonitis due to inhalation of food and vomit: Secondary | ICD-10-CM | POA: Insufficient documentation

## 2020-07-25 DIAGNOSIS — Z9981 Dependence on supplemental oxygen: Secondary | ICD-10-CM | POA: Insufficient documentation

## 2020-07-25 DIAGNOSIS — Z955 Presence of coronary angioplasty implant and graft: Secondary | ICD-10-CM | POA: Insufficient documentation

## 2020-07-25 DIAGNOSIS — Z87891 Personal history of nicotine dependence: Secondary | ICD-10-CM | POA: Insufficient documentation

## 2020-07-25 DIAGNOSIS — Z7982 Long term (current) use of aspirin: Secondary | ICD-10-CM | POA: Insufficient documentation

## 2020-07-25 DIAGNOSIS — D649 Anemia, unspecified: Secondary | ICD-10-CM | POA: Insufficient documentation

## 2020-07-25 DIAGNOSIS — Z8249 Family history of ischemic heart disease and other diseases of the circulatory system: Secondary | ICD-10-CM | POA: Diagnosis not present

## 2020-07-25 DIAGNOSIS — Z8501 Personal history of malignant neoplasm of esophagus: Secondary | ICD-10-CM | POA: Insufficient documentation

## 2020-07-25 DIAGNOSIS — I2510 Atherosclerotic heart disease of native coronary artery without angina pectoris: Secondary | ICD-10-CM | POA: Diagnosis not present

## 2020-07-25 DIAGNOSIS — Z79899 Other long term (current) drug therapy: Secondary | ICD-10-CM | POA: Insufficient documentation

## 2020-07-25 DIAGNOSIS — R059 Cough, unspecified: Secondary | ICD-10-CM | POA: Diagnosis not present

## 2020-07-25 DIAGNOSIS — R634 Abnormal weight loss: Secondary | ICD-10-CM

## 2020-07-25 DIAGNOSIS — I959 Hypotension, unspecified: Secondary | ICD-10-CM | POA: Insufficient documentation

## 2020-07-25 DIAGNOSIS — C155 Malignant neoplasm of lower third of esophagus: Secondary | ICD-10-CM

## 2020-07-25 DIAGNOSIS — Z79890 Hormone replacement therapy (postmenopausal): Secondary | ICD-10-CM | POA: Diagnosis not present

## 2020-07-25 DIAGNOSIS — R1310 Dysphagia, unspecified: Secondary | ICD-10-CM

## 2020-07-25 DIAGNOSIS — J90 Pleural effusion, not elsewhere classified: Secondary | ICD-10-CM | POA: Diagnosis not present

## 2020-07-25 MED ORDER — AMOXICILLIN-POT CLAVULANATE 875-125 MG PO TABS: 1.0000 | ORAL_TABLET | Freq: Two times a day (BID) | ORAL | 0 refills | Status: DC

## 2020-07-25 NOTE — Patient Instructions (Addendum)
You are here for follow up of aspiration pneumonitis and multifocal pneumonia- you are doing better on augmentin( amoxicillin/clavulunate) and improving. Will give another 2 weeks of the same antibiotic. Do not take any other  antibiotic We will repeat a cxr today and also do labs cbc/cmp Take  yogurt every day ( [place], [place] etc)

## 2020-07-25 NOTE — Telephone Encounter (Signed)
Messaged by Dr. Delaine Lame through ID- still has not heard from GI. Would like to see someone locally- can we please see if his original GI can see him in the next day or so, and if not I've put in an urgent referral to GI. Please get him in anywhere locally ASAP. Thanks.

## 2020-07-25 NOTE — Progress Notes (Signed)
NAME: Corey Perry  DOB: 10-25-1945  MRN: [id number]  Date/Time: 07/25/2020 12:20 PM   Subjective:  Follow up or aspiration pneumonitis Here with his son ? GAMAL TODISCO is a 74 y.o. with a history of recurrent adenocarcinoma of the esophagus status post chemotherapy/XRT in 2017 with esophagectomy on 10/07/2016. Patient has history of dysphagia with esophageal stricture requiring dilation. He has had recent worsening dysphagia with weight loss. Patient was found to have recurrent adenocarcinoma in the lower third of the esophagus now s/p FOLFOX on active surveillance.   Patient was hospitalized 05/21/2020 -05/24/2020 with sepsis from multifocal pneumonia.    Patient has had intermittently shortness of breath and repeat chest x-ray done recently by his primary provider demonstrated multifocal pneumonia bilaterally more so on the right side and I saw the patient on 07/13/20 and started him on Augmentin for multifocal aspiration pneumonitis He is doing better from breathing and coughing perspective Saw Dr.Fleming pulmonologist and has received home oxygen  Still has  trouble swallowing food and water.  He feels the food hangs in his chest for a long period of time and he has to throw up after that.  He does not induce vomiting.   No fever or chills He is waiting to see GI  He is followed by Dr. Abigail Butts gastrointestinal surgery at [facility].  He last had a endoscopy on 08/06/2019.  The surgeon's note indicates that the flexible esophagoscopy was performed and the scope could not pass what appears to be an extrinsic compression at 15 cm.  A flexible guidewire was then passed and serial dilatation was performed from 41 [place] to 59 [place] and the guidewire and dilator were removed.  Biopsies were taken.  And that revealed a malignancy and patient was started treatment by Dr. [name]. Prior to that patient has had EGD with pyloric dilatation and esophageal dilatation in 2019 and in February  2020.  Past Medical History:  Diagnosis Date  . Abnormal white blood cell (WBC) count    seeing Dr. [name] at [place] on 10/25  . Anemia   . Atrial fibrillation ([place])   . BPH (benign prostatic hypertrophy)   . Bradycardia   . CAD (coronary artery disease)   . Dysrhythmia    bradycardia - sees Dr. Humphrey Rolls  . Esophageal cancer ([place]) 05/2016  . GERD (gastroesophageal reflux disease)   . History of kidney stones   . Hyperlipidemia   . Hypertension   . Hypothyroidism   . Kidney stone   . Myocardial infarction ([place]) 2005  . Pneumonia   . Pneumonia 06/22/2020  . S/P angioplasty with stent 2005   x 4 vessels  . Sleep apnea    mild-NO CPAP  . Spinal stenosis    lumbar    Past Surgical History:  Procedure Laterality Date  . BACK SURGERY    . CARDIAC CATHETERIZATION Left 03/12/2016   Procedure: Left Heart Cath and Coronary Angiography;  Surgeon: [name], MD;  Location: [place] [facility];  Service: Cardiovascular;  Laterality: Left;  . CARDIAC CATHETERIZATION N/A 03/12/2016   Procedure: Coronary Stent Intervention;  Surgeon: Yolonda Kida, MD;  Location: [place] [facility];  Service: Cardiovascular;  Laterality: N/A;  . COLONOSCOPY WITH PROPOFOL N/A 07/13/2015   Procedure: COLONOSCOPY WITH PROPOFOL;  Surgeon: Lucilla Lame, MD;  Location: [place];  Service: Endoscopy;  Laterality: N/A;  . CORONARY ANGIOPLASTY  10/05 and 12/05   4 DES placed  . CYSTOSCOPY W/ RETROGRADES Left 06/15/2019  Procedure: CYSTOSCOPY WITH RETROGRADE PYELOGRAM;  Surgeon: Abbie Sons, MD;  Location: [facility];  Service: Urology;  Laterality: Left;  . CYSTOSCOPY/URETEROSCOPY/HOLMIUM LASER/STENT PLACEMENT Left 06/15/2019   Procedure: CYSTOSCOPY/URETEROSCOPY/HOLMIUM LASER/STENT PLACEMENT;  Surgeon: Abbie Sons, MD;  Location: [facility];  Service: Urology;  Laterality: Left;  . ESOPHAGECTOMY  10/07/2016   @ [facility]  . ESOPHAGOGASTRODUODENOSCOPY (EGD) WITH PROPOFOL N/A 06/14/2016    Procedure: ESOPHAGOGASTRODUODENOSCOPY (EGD) WITH PROPOFOL;  Surgeon: [name], MD;  Location: [facility];  Service: Endoscopy;  Laterality: N/A;  . HERNIA REPAIR Bilateral    x3  . INGUINAL HERNIA REPAIR Right 08/28/2017   Large PerFix plug, recurrent hernia;  Surgeon: Robert Bellow, MD;  Location: [facility];  Service: General;  Laterality: Right;  recurrent  . KIDNEY STONE SURGERY  07/15/2019  . POLYPECTOMY  07/13/2015   Procedure: POLYPECTOMY;  Surgeon: Lucilla Lame, MD;  Location: [place];  Service: Endoscopy;;  . PORT-A-CATH REMOVAL N/A 01/16/2017   Procedure: REMOVAL PORT-A-CATH;  Surgeon: Olean Ree, MD;  Location: [facility];  Service: General;  Laterality: N/A;  . PORTACATH PLACEMENT N/A 07/08/2016   Procedure: INSERTION PORT-A-CATH;  Surgeon: Olean Ree, MD;  Location: [facility];  Service: General;  Laterality: N/A;  . PORTACATH PLACEMENT Right 08/30/2019   Procedure: INSERTION PORT-A-CATH;  Surgeon: Olean Ree, MD;  Location: [facility];  Service: General;  Laterality: Right;    Social History   Socioeconomic History  . Marital status: Married    Spouse name: Not on file  . Number of children: Not on file  . Years of education: Not on file  . Highest education level: 9th grade  Occupational History  . Not on file  Tobacco Use  . Smoking status: Former Smoker    Packs/day: 1.00    Years: 40.00    Pack years: 40.00    Types: Cigarettes    Quit date: 07/16/2004    Years since quitting: 16.0  . Smokeless tobacco: Former Systems developer    Types: Secondary school teacher  . Vaping Use: Never used  Substance and Sexual Activity  . Alcohol use: Yes    Alcohol/week: 1.0 standard drink    Types: 1 Cans of beer per week  . Drug use: No  . Sexual activity: Yes    Birth control/protection: None  Other Topics Concern  . Not on file  Social History Narrative  . Not on file   Social Determinants of Health   Financial Resource Strain:   . Difficulty of Paying  Living Expenses: Not on file  Food Insecurity:   . Worried About Charity fundraiser in the Last Year: Not on file  . Ran Out of Food in the Last Year: Not on file  Transportation Needs:   . Lack of Transportation (Medical): Not on file  . Lack of Transportation (Non-Medical): Not on file  Physical Activity:   . Days of Exercise per Week: Not on file  . Minutes of Exercise per Session: Not on file  Stress:   . Feeling of Stress : Not on file  Social Connections:   . Frequency of Communication with Friends and Family: Not on file  . Frequency of Social Gatherings with Friends and Family: Not on file  . Attends Religious Services: Not on file  . Active Member of Clubs or Organizations: Not on file  . Attends Archivist Meetings: Not on file  . Marital Status: Not on file  Intimate Partner Violence:   .  Fear of Current or Ex-Partner: Not on file  . Emotionally Abused: Not on file  . Physically Abused: Not on file  . Sexually Abused: Not on file    Family History  Problem Relation Age of Onset  . Hypertension Mother   . Dementia Mother   . Heart disease Father   . Hypertension Father   . Kidney disease Father   . Hypertension Brother   . Heart disease Brother   . Diabetes Son   . Hypertension Son   . Hyperlipidemia Son   . Hyperlipidemia Daughter   . Hypertension Daughter    No Known Allergies  Current Outpatient Medications  Medication Sig Dispense Refill  . acetaminophen (TYLENOL) 500 MG tablet Take 500 mg by mouth every 6 (six) hours as needed for mild pain or moderate pain.    Marland Kitchen aspirin 81 MG tablet Take 81 mg by mouth daily.     Marland Kitchen atorvastatin (LIPITOR) 40 MG tablet Take 1 tablet (40 mg total) by mouth daily. 90 tablet 1  . clopidogrel (PLAVIX) 75 MG tablet Take 1 tablet (75 mg total) by mouth daily. 90 tablet 3  . diphenoxylate-atropine (LOMOTIL) 2.5-0.025 MG tablet Take 2 tablets by mouth 4 (four) times daily as needed for diarrhea or loose stools. 30  tablet 0  . feeding supplement, ENSURE ENLIVE, (ENSURE ENLIVE) LIQD Take 237 mLs by mouth 2 (two) times daily between meals. 237 mL 12  . levofloxacin (LEVAQUIN) 750 MG tablet Take 1 tablet (750 mg total) by mouth daily. 10 tablet 0  . levothyroxine (SYNTHROID) 50 MCG tablet Take 1 tablet (50 mcg total) by mouth daily. 90 tablet 1  . loperamide (IMODIUM A-D) 2 MG tablet Take 2 mg by mouth 4 (four) times daily as needed for diarrhea or loose stools.     . metoprolol succinate (TOPROL-XL) 25 MG 24 hr tablet Take 0.5 tablets (12.5 mg total) by mouth daily. 90 tablet 1  . multivitamin-iron-minerals-folic acid (CENTRUM) chewable tablet Chew 1 tablet by mouth daily.    . nitroGLYCERIN (NITROSTAT) 0.4 MG SL tablet Place 1 tablet under the tongue every 5 (five) minutes as needed for chest pain.   98  . ondansetron (ZOFRAN) 8 MG tablet Take 1 tablet (8 mg total) by mouth 2 (two) times daily as needed for refractory nausea / vomiting. Start on day 3 after chemotherapy. 60 tablet 2  . pantoprazole (PROTONIX) 20 MG tablet Take 20 mg by mouth daily.    . Probiotic Product ([place]) CAPS Take 1 capsule by mouth in the morning.    . prochlorperazine (COMPAZINE) 10 MG tablet Take 1 tablet (10 mg total) by mouth every 6 (six) hours as needed (Nausea or vomiting). 60 tablet 2   No current facility-administered medications for this visit.     Abtx:  Anti-infectives (From admission, onward)   None      REVIEW OF SYSTEMS:  Const: No fever or chills Eyes: negative diplopia or visual changes, negative eye pain ENT: negative coryza, negative sore throat Resp: cough, , dyspnea Cards: Pleuritic chest pain, no palpitations, lower extremity edema GU: negative for frequency, dysuria and hematuria GI: Negative for abdominal pain, diarrhea, bleeding, constipation Skin: negative for rash and pruritus Heme: negative for easy bruising and gum/nose bleeding MS: Generalized weakness Neurolo:dizziness,    Psych: negative for feelings of anxiety, depression  Endocrine: negative for thyroid, diabetes Allergy/Immunology-no allergies  Objective:  VITALS:  BP 110/70   Pulse (!) 102   Temp 98  F (36.7 C)   Resp 16   Ht 5\' 9"  (1.753 m)   Wt 121 lb (54.9 kg)   SpO2 98%   BMI 17.87 kg/m  PHYSICAL EXAM:  General: Alert, cooperative, no distress at distress,thin- Pulse ox at rest 96% - on walking 92%  Head: Normocephalic, without obvious abnormality, atraumatic. Eyes: Conjunctivae clear, anicteric sclerae. Pupils are equal ENT Nares normal. No drainage or sinus tenderness. Lips, mucosa, and tongue normal. No Thrush Neck: Supple, . Back: No CVA tenderness. Lungs: b/l air entry crepts bases , no rhonchi Heart: s1s2-tachy PORT chest wall Abdomen: Soft, non-tender,not distended. Bowel sounds normal. No masses Extremities: atraumatic, no cyanosis. No edema. No clubbing Skin: limited examination Lymph: Cervical, supraclavicular normal. Neurologic: Grossly non-focal Pertinent Labs Lab Results CBC    Component Value Date/Time   WBC 5.2 06/07/2020 1118   WBC 5.7 05/24/2020 0705   RBC 2.88 (L) 06/07/2020 1118   RBC 2.40 (L) 05/24/2020 0705   HGB 8.7 (L) 06/07/2020 1118   HCT 27.9 (L) 06/07/2020 1118   PLT 141 (L) 06/07/2020 1118   MCV 97 06/07/2020 1118   MCH 30.2 06/07/2020 1118   MCH 30.8 05/24/2020 0705   MCHC 31.2 (L) 06/07/2020 1118   MCHC 31.0 05/24/2020 0705   RDW 16.3 (H) 06/07/2020 1118   LYMPHSABS 0.6 (L) 06/07/2020 1118   MONOABS 0.5 05/21/2020 2121   EOSABS 0.1 06/07/2020 1118   BASOSABS 0.0 06/07/2020 1118    CMP Latest Ref Rng & Units 06/07/2020 05/24/2020 05/23/2020  Glucose 65 - 99 mg/dL 89 95 90  BUN 8 - 27 mg/dL 18 19 24(H)  Creatinine 0.76 - 1.27 mg/dL 1.28(H) 1.21 1.35(H)  Sodium 134 - 144 mmol/L 141 131(L) 129(L)  Potassium 3.5 - 5.2 mmol/L 4.5 3.9 4.5  Chloride 96 - 106 mmol/L 102 96(L) 95(L)  CO2 20 - 29 mmol/L 25 27 27   Calcium 8.6 - 10.2 mg/dL 8.6 8.2(L)  8.2(L)  Total Protein 6.0 - 8.5 g/dL 7.6 - -  Total Bilirubin 0.0 - 1.2 mg/dL 0.4 - -  Alkaline Phos 44 - 121 IU/L 72 - -  AST 0 - 40 IU/L 43(H) - -  ALT 0 - 44 IU/L 35 - -      Microbiology: No results found for this or any previous visit (from the past 240 hour(s)).  IMAGING RESULTS:  I have personally reviewed the films Nodular ground-glass infiltrates throughout the right lung with consolidation in the right lower lung. Patchy nodular infiltrates in the left lower lung. Changes could be due to pneumonia, metastasis, or aspiration. This is progressing since previous PET-CT. 4. Moderately prominent lymph nodes in the mediastinum, nonspecific but could indicate metastatic disease. ?  Impression/Recommendation ? Multifocal pneumonia of the lungs right more than the left.  This is due to chronic aspiration.  DD is atypical mycobacteria or opportunistic infections like nocardia . He is feeling  better after starting amoxicillin+clavulunate ( augmentin) Oxygenation has improved, coughing less, clearish sputum Still has dysphagia and has to vomit Saw pulmonologist and is on home oxygen   patient has recurrent esophageal carcinoma  He has had chemo in 2018 followed by radiation and surgery.  ? ?He has dysphagia and vomiting.  He has a history of esophageal strictures.  He has had esophageal dilatation frequently in the past. He has to see his GI surgeon and undergo another endoscopy and possible esophageal dilatation.   CAD status post stents  Hypertension.  Now he is hypotensive.  Recommend  ruling out adrenal insufficiency.  He is on metoprolol and the dose may have to be adjusted  Anemia could be from his malignancy and malnutrition.  He is on Plavix.  Will repeat CXR today and also labs cbc/CMp Will continue amox/clav for 2 more weeks Follow up PRN ___________________________________________________  Note:  This document was prepared using Dragon voice recognition  software and may include unintentional dictation errors.

## 2020-07-26 ENCOUNTER — Telehealth: Payer: Self-pay

## 2020-07-26 LAB — COMPREHENSIVE METABOLIC PANEL
ALT: 48 IU/L — ABNORMAL HIGH (ref 0–44)
AST: 50 IU/L — ABNORMAL HIGH (ref 0–40)
Albumin/Globulin Ratio: 0.6 — ABNORMAL LOW (ref 1.2–2.2)
Albumin: 3 g/dL — ABNORMAL LOW (ref 3.7–4.7)
Alkaline Phosphatase: 93 IU/L (ref 44–121)
BUN/Creatinine Ratio: 20 (ref 10–24)
BUN: 26 mg/dL (ref 8–27)
Bilirubin Total: 0.4 mg/dL (ref 0.0–1.2)
CO2: 25 mmol/L (ref 20–29)
Calcium: 8.6 mg/dL (ref 8.6–10.2)
Chloride: 102 mmol/L (ref 96–106)
Creatinine, Ser: 1.28 mg/dL — ABNORMAL HIGH (ref 0.76–1.27)
GFR calc Af Amer: 64 mL/min/{1.73_m2} (ref >59)
GFR calc non Af Amer: 55 mL/min/{1.73_m2} — ABNORMAL LOW (ref >59)
Globulin, Total: 4.8 g/dL — ABNORMAL HIGH (ref 1.5–4.5)
Glucose: 83 mg/dL (ref 65–99)
Potassium: 4.6 mmol/L (ref 3.5–5.2)
Sodium: 140 mmol/L (ref 134–144)
Total Protein: 7.8 g/dL (ref 6.0–8.5)

## 2020-07-26 LAB — CBC WITH DIFFERENTIAL/PLATELET
Basophils Absolute: 0 10*3/uL (ref 0.0–0.2)
Basos: 0 %
EOS (ABSOLUTE): 0.1 10*3/uL (ref 0.0–0.4)
Eos: 2 %
Hematocrit: 30.1 % — ABNORMAL LOW (ref 37.5–51.0)
Hemoglobin: 9.2 g/dL — ABNORMAL LOW (ref 13.0–17.7)
Immature Grans (Abs): 0 10*3/uL (ref 0.0–0.1)
Immature Granulocytes: 0 %
Lymphocytes Absolute: 0.8 10*3/uL (ref 0.7–3.1)
Lymphs: 12 %
MCH: 27.9 pg (ref 26.6–33.0)
MCHC: 30.6 g/dL — ABNORMAL LOW (ref 31.5–35.7)
MCV: 91 fL (ref 79–97)
Monocytes Absolute: 0.5 10*3/uL (ref 0.1–0.9)
Monocytes: 7 %
Neutrophils Absolute: 5.3 10*3/uL (ref 1.4–7.0)
Neutrophils: 79 %
Platelets: 222 10*3/uL (ref 150–450)
RBC: 3.3 x10E6/uL — ABNORMAL LOW (ref 4.14–5.80)
RDW: 15.8 % — ABNORMAL HIGH (ref 11.6–15.4)
WBC: 6.7 10*3/uL (ref 3.4–10.8)

## 2020-07-26 NOTE — Telephone Encounter (Signed)
Advised patient of lab/imaging results.When compared to last labs and xrays there is little change. Recommended he follow Dr.Ravishankar's plan and take the 2 weeks of Abx. Advised he will follow up PRN and instructed to call office  if symptoms worsen or he feels no better after this antibiotic. Patient expressed gratitude and understanding.

## 2020-07-27 ENCOUNTER — Inpatient Hospital Stay: Payer: Medicare Other | Attending: Oncology

## 2020-07-27 ENCOUNTER — Other Ambulatory Visit: Payer: Self-pay

## 2020-07-27 ENCOUNTER — Ambulatory Visit: Payer: Medicare Other | Admitting: Infectious Diseases

## 2020-07-27 ENCOUNTER — Encounter
Admission: RE | Admit: 2020-07-27 | Discharge: 2020-07-27 | Disposition: A | Discharge status: Home / Self Care | Payer: Medicare Other | Source: Ambulatory Visit | Attending: Oncology | Admitting: Oncology

## 2020-07-27 ENCOUNTER — Ambulatory Visit: Payer: Medicare Other | Admitting: Gastroenterology

## 2020-07-27 DIAGNOSIS — R1110 Vomiting, unspecified: Secondary | ICD-10-CM | POA: Diagnosis not present

## 2020-07-27 DIAGNOSIS — R531 Weakness: Secondary | ICD-10-CM | POA: Insufficient documentation

## 2020-07-27 DIAGNOSIS — C155 Malignant neoplasm of lower third of esophagus: Secondary | ICD-10-CM | POA: Insufficient documentation

## 2020-07-27 DIAGNOSIS — E039 Hypothyroidism, unspecified: Secondary | ICD-10-CM | POA: Diagnosis not present

## 2020-07-27 DIAGNOSIS — Z87891 Personal history of nicotine dependence: Secondary | ICD-10-CM | POA: Diagnosis not present

## 2020-07-27 DIAGNOSIS — Z818 Family history of other mental and behavioral disorders: Secondary | ICD-10-CM | POA: Insufficient documentation

## 2020-07-27 DIAGNOSIS — Z841 Family history of disorders of kidney and ureter: Secondary | ICD-10-CM | POA: Insufficient documentation

## 2020-07-27 DIAGNOSIS — R059 Cough, unspecified: Secondary | ICD-10-CM | POA: Diagnosis not present

## 2020-07-27 DIAGNOSIS — Z9049 Acquired absence of other specified parts of digestive tract: Secondary | ICD-10-CM | POA: Diagnosis not present

## 2020-07-27 DIAGNOSIS — N289 Disorder of kidney and ureter, unspecified: Secondary | ICD-10-CM | POA: Insufficient documentation

## 2020-07-27 DIAGNOSIS — Z833 Family history of diabetes mellitus: Secondary | ICD-10-CM | POA: Diagnosis not present

## 2020-07-27 DIAGNOSIS — I4891 Unspecified atrial fibrillation: Secondary | ICD-10-CM | POA: Diagnosis not present

## 2020-07-27 DIAGNOSIS — Z681 Body mass index (BMI) 19 or less, adult: Secondary | ICD-10-CM | POA: Insufficient documentation

## 2020-07-27 DIAGNOSIS — R634 Abnormal weight loss: Secondary | ICD-10-CM | POA: Insufficient documentation

## 2020-07-27 DIAGNOSIS — Z8249 Family history of ischemic heart disease and other diseases of the circulatory system: Secondary | ICD-10-CM | POA: Insufficient documentation

## 2020-07-27 DIAGNOSIS — Z79899 Other long term (current) drug therapy: Secondary | ICD-10-CM | POA: Insufficient documentation

## 2020-07-27 DIAGNOSIS — N400 Enlarged prostate without lower urinary tract symptoms: Secondary | ICD-10-CM | POA: Diagnosis not present

## 2020-07-27 DIAGNOSIS — R197 Diarrhea, unspecified: Secondary | ICD-10-CM | POA: Insufficient documentation

## 2020-07-27 DIAGNOSIS — J90 Pleural effusion, not elsewhere classified: Secondary | ICD-10-CM | POA: Diagnosis not present

## 2020-07-27 DIAGNOSIS — I2510 Atherosclerotic heart disease of native coronary artery without angina pectoris: Secondary | ICD-10-CM | POA: Diagnosis not present

## 2020-07-27 DIAGNOSIS — Z8349 Family history of other endocrine, nutritional and metabolic diseases: Secondary | ICD-10-CM | POA: Insufficient documentation

## 2020-07-27 DIAGNOSIS — D649 Anemia, unspecified: Secondary | ICD-10-CM | POA: Insufficient documentation

## 2020-07-27 DIAGNOSIS — I252 Old myocardial infarction: Secondary | ICD-10-CM | POA: Insufficient documentation

## 2020-07-27 DIAGNOSIS — R630 Anorexia: Secondary | ICD-10-CM | POA: Insufficient documentation

## 2020-07-27 DIAGNOSIS — J181 Lobar pneumonia, unspecified organism: Secondary | ICD-10-CM | POA: Diagnosis not present

## 2020-07-27 DIAGNOSIS — K219 Gastro-esophageal reflux disease without esophagitis: Secondary | ICD-10-CM | POA: Insufficient documentation

## 2020-07-27 DIAGNOSIS — C159 Malignant neoplasm of esophagus, unspecified: Secondary | ICD-10-CM | POA: Diagnosis not present

## 2020-07-27 DIAGNOSIS — K3189 Other diseases of stomach and duodenum: Secondary | ICD-10-CM | POA: Diagnosis not present

## 2020-07-27 LAB — GLUCOSE, CAPILLARY: Glucose-Capillary: 75 mg/dL (ref 70–99)

## 2020-07-27 LAB — CBC WITH DIFFERENTIAL/PLATELET
Abs Immature Granulocytes: 0.02 10*3/uL (ref 0.00–0.07)
Basophils Absolute: 0 10*3/uL (ref 0.0–0.1)
Basophils Relative: 1 %
Eosinophils Absolute: 0.1 10*3/uL (ref 0.0–0.5)
Eosinophils Relative: 2 %
HCT: 30.3 % — ABNORMAL LOW (ref 39.0–52.0)
Hemoglobin: 9.2 g/dL — ABNORMAL LOW (ref 13.0–17.0)
Immature Granulocytes: 0 %
Lymphocytes Relative: 11 %
Lymphs Abs: 0.6 10*3/uL — ABNORMAL LOW (ref 0.7–4.0)
MCH: 27.5 pg (ref 26.0–34.0)
MCHC: 30.4 g/dL (ref 30.0–36.0)
MCV: 90.7 fL (ref 80.0–100.0)
Monocytes Absolute: 0.5 10*3/uL (ref 0.1–1.0)
Monocytes Relative: 10 %
Neutro Abs: 4.2 10*3/uL (ref 1.7–7.7)
Neutrophils Relative %: 76 %
Platelets: 193 10*3/uL (ref 150–400)
RBC: 3.34 MIL/uL — ABNORMAL LOW (ref 4.22–5.81)
RDW: 17.9 % — ABNORMAL HIGH (ref 11.5–15.5)
WBC: 5.5 10*3/uL (ref 4.0–10.5)
nRBC: 0 % (ref 0.0–0.2)

## 2020-07-27 MED ORDER — HEPARIN SOD (PORK) LOCK FLUSH 100 UNIT/ML IV SOLN
INTRAVENOUS | Status: AC
  Filled 2020-07-27: qty 5

## 2020-07-27 MED ORDER — FLUDEOXYGLUCOSE F - 18 (FDG) INJECTION
6.2000 | Freq: Once | INTRAVENOUS | Status: AC | PRN
  Administered 2020-07-27: 6.19 via INTRAVENOUS

## 2020-07-27 MED ORDER — SODIUM CHLORIDE 0.9% FLUSH
10.0000 mL | Freq: Once | INTRAVENOUS | Status: AC
  Administered 2020-07-27: 10 mL via INTRAVENOUS
  Filled 2020-07-27: qty 10

## 2020-07-27 MED ORDER — HEPARIN SOD (PORK) LOCK FLUSH 100 UNIT/ML IV SOLN
500.0000 [IU] | Freq: Once | INTRAVENOUS | Status: AC
  Administered 2020-07-27: 500 [IU] via INTRAVENOUS
  Filled 2020-07-27: qty 5

## 2020-07-28 ENCOUNTER — Telehealth: Payer: Self-pay

## 2020-07-28 NOTE — Telephone Encounter (Signed)
1123 am.  Phone call made to patient's cell number but it goes directly to VM.  I have left a message requesting a call back.  Phone call competed to wife Ethel's cell number.  Wife is able to speak with me to provide an update on patient's condition.  Patient is currently being treated for pneumonia but is not improving.  He continues to cough up a lot of phlegm, mostly at night.  He is currently on O2 which wife states has been helpful.  Patient has been seen by multiple providers this week and is scheduled for a procedure next Wednesday.  Patient is to be seen next Monday by Dr. [name].  Wife states she is not sure the patient will be able to have the procedure completed due to his weakness.    Patient's weight is down to 120 lbs and wife states he is unable to keep any food down.  She states he is eating but vomits almost all the time.  Wife states she keeps a trash can next to his bed due to the amount of vomiting that occurs.  Patient is currently asleep and wife reports he is not sleeping very well during the night.  He often takes frequent naps during the day.  I have asked wife if she is aware of what patient's wishes are and she is uncertain other than he does not wish to be placed on a ventilator.  I advised that I would follow up next Tuesday after his appointment to see if the procedure would occur.  I would like to complete an in-person visit next week to further discuss goals of care.   Wife voiced appreciation for follow up call and will update me on patient again next week.

## 2020-07-29 NOTE — Progress Notes (Signed)
[place]  Telephone:([phone]) [phone] Fax:(336) [phone]  ID: [name] OB: 1946-09-16  MR#: [id number]  QAS#:[id number]  Patient Care Team: Valerie Roys, DO as PCP - General (Family Medicine) [name], MD as PCP - Family Medicine (Family Medicine) [name], MD as Consulting Physician (Oncology) [name], MD as Referring Physician [name], MD as Consulting Physician (Cardiology)  CHIEF COMPLAINT: Recurrent adenocarcinoma of the lower third of the esophagus.  INTERVAL HISTORY: Patient returns to clinic today for further evaluation and discussion of his imaging results.  He has a poor appetite and states he cannot swallow solids without regurgitating.  He is able to occasionally swallow liquids.  His performance status is declining and he has lost significant weight.  He continues to have chronic weakness and fatigue. He has no neurologic complaints.  He denies any recent fevers or illnesses.  He denies any chest pain, shortness of breath, cough, or hemoptysis.  He denies any nausea, vomiting, or constipation. He has no melena or hematochezia.  He has no urinary complaints.  Patient offers no further specific complaints today.  REVIEW OF SYSTEMS:   Review of Systems  Constitutional: Positive for malaise/fatigue and weight loss. Negative for fever.  HENT: Negative.   Respiratory: Negative.  Negative for cough and shortness of breath.   Cardiovascular: Negative.  Negative for chest pain and leg swelling.  Gastrointestinal: Positive for vomiting. Negative for abdominal pain, blood in stool, constipation, diarrhea, melena and nausea.  Genitourinary: Negative.  Negative for frequency and urgency.  Musculoskeletal: Negative.  Negative for back pain.  Skin: Negative.  Negative for rash.  Neurological: Positive for weakness. Negative for tingling, focal weakness and headaches.  Psychiatric/Behavioral: Negative.  The patient is not  nervous/anxious and does not have insomnia.     As per HPI. Otherwise, a complete review of systems is negative.  PAST MEDICAL HISTORY: Past Medical History:  Diagnosis Date  . Abnormal white blood cell (WBC) count    seeing Dr. [name] at [place] on 10/25  . Anemia   . Atrial fibrillation ([place])   . BPH (benign prostatic hypertrophy)   . Bradycardia   . CAD (coronary artery disease)   . Dysrhythmia    bradycardia - sees Dr. Humphrey Rolls  . Esophageal cancer ([place]) 05/2016  . GERD (gastroesophageal reflux disease)   . History of kidney stones   . Hyperlipidemia   . Hypertension   . Hypothyroidism   . Kidney stone   . Myocardial infarction ([place]) 2005  . Pneumonia   . Pneumonia 06/22/2020  . S/P angioplasty with stent 2005   x 4 vessels  . Sleep apnea    mild-NO CPAP  . Spinal stenosis    lumbar    PAST SURGICAL HISTORY: Past Surgical History:  Procedure Laterality Date  . BACK SURGERY    . CARDIAC CATHETERIZATION Left 03/12/2016   Procedure: Left Heart Cath and Coronary Angiography;  Surgeon: [name], MD;  Location: [place] [facility];  Service: Cardiovascular;  Laterality: Left;  . CARDIAC CATHETERIZATION N/A 03/12/2016   Procedure: Coronary Stent Intervention;  Surgeon: Yolonda Kida, MD;  Location: [place] [facility];  Service: Cardiovascular;  Laterality: N/A;  . COLONOSCOPY WITH PROPOFOL N/A 07/13/2015   Procedure: COLONOSCOPY WITH PROPOFOL;  Surgeon: Lucilla Lame, MD;  Location: [facility];  Service: Endoscopy;  Laterality: N/A;  . CORONARY ANGIOPLASTY  10/05 and 12/05   4 DES placed  . CYSTOSCOPY W/ RETROGRADES  Left 06/15/2019   Procedure: CYSTOSCOPY WITH RETROGRADE PYELOGRAM;  Surgeon: Abbie Sons, MD;  Location: [facility];  Service: Urology;  Laterality: Left;  . CYSTOSCOPY/URETEROSCOPY/HOLMIUM LASER/STENT PLACEMENT Left 06/15/2019   Procedure: CYSTOSCOPY/URETEROSCOPY/HOLMIUM LASER/STENT PLACEMENT;  Surgeon: Abbie Sons, MD;  Location: [facility]  [facility];  Service: Urology;  Laterality: Left;  . ESOPHAGECTOMY  10/07/2016   @ [facility]  . ESOPHAGOGASTRODUODENOSCOPY (EGD) WITH PROPOFOL N/A 06/14/2016   Procedure: ESOPHAGOGASTRODUODENOSCOPY (EGD) WITH PROPOFOL;  Surgeon: [name], MD;  Location[facility];  Service: Endoscopy;  Laterality: N/A;  . HERNIA REPAIR Bilateral    x3  . INGUINAL HERNIA REPAIR Right 08/28/2017   Large PerFix plug, recurrent hernia;  Surgeon: Robert Bellow, MD;  Location: [facility];  Service: General;  Laterality: Right;  recurrent  . KIDNEY STONE SURGERY  07/15/2019  . POLYPECTOMY  07/13/2015   Procedure: POLYPECTOMY;  Surgeon: Lucilla Lame, MD;  Location: [place];  Service: Endoscopy;;  . PORT-A-CATH REMOVAL N/A 01/16/2017   Procedure: REMOVAL PORT-A-CATH;  Surgeon: Olean Ree, MD;  Location: [facility];  Service: General;  Laterality: N/A;  . PORTACATH PLACEMENT N/A 07/08/2016   Procedure: INSERTION PORT-A-CATH;  Surgeon: Olean Ree, MD;  Location: [facility];  Service: General;  Laterality: N/A;  . PORTACATH PLACEMENT Right 08/30/2019   Procedure: INSERTION PORT-A-CATH;  Surgeon: Olean Ree, MD;  Location: [facility];  Service: General;  Laterality: Right;    FAMILY HISTORY Family History  Problem Relation Age of Onset  . Hypertension Mother   . Dementia Mother   . Heart disease Father   . Hypertension Father   . Kidney disease Father   . Hypertension Brother   . Heart disease Brother   . Diabetes Son   . Hypertension Son   . Hyperlipidemia Son   . Hyperlipidemia Daughter   . Hypertension Daughter        ADVANCED DIRECTIVES:    HEALTH MAINTENANCE: Social History   Tobacco Use  . Smoking status: Former Smoker    Packs/day: 1.00    Years: 40.00    Pack years: 40.00    Types: Cigarettes    Quit date: 07/16/2004    Years since quitting: 16.0  . Smokeless tobacco: Former Systems developer    Types: Secondary school teacher  . Vaping Use: Never used  Substance Use Topics  . Alcohol  use: Yes    Alcohol/week: 1.0 standard drink    Types: 1 Cans of beer per week  . Drug use: No     Colonoscopy:  PAP:  Bone density:  Lipid panel:  No Known Allergies  Current Outpatient Medications  Medication Sig Dispense Refill  . acetaminophen (TYLENOL) 500 MG tablet Take 500 mg by mouth every 6 (six) hours as needed for mild pain or moderate pain.    Marland Kitchen amoxicillin-clavulanate (AUGMENTIN) 875-125 MG tablet Take 1 tablet by mouth 2 (two) times daily. 28 tablet 0  . aspirin 81 MG tablet Take 81 mg by mouth daily.     Marland Kitchen atorvastatin (LIPITOR) 40 MG tablet Take 1 tablet (40 mg total) by mouth daily. 90 tablet 1  . clopidogrel (PLAVIX) 75 MG tablet Take 1 tablet (75 mg total) by mouth daily. 90 tablet 3  . diphenoxylate-atropine (LOMOTIL) 2.5-0.025 MG tablet Take 2 tablets by mouth 4 (four) times daily as needed for diarrhea or loose stools. 30 tablet 0  . feeding supplement, ENSURE ENLIVE, (ENSURE ENLIVE) LIQD Take 237 mLs by mouth 2 (two) times daily between meals.  237 mL 12  . levothyroxine (SYNTHROID) 50 MCG tablet Take 1 tablet (50 mcg total) by mouth daily. 90 tablet 1  . loperamide (IMODIUM A-D) 2 MG tablet Take 2 mg by mouth 4 (four) times daily as needed for diarrhea or loose stools.     . metoprolol succinate (TOPROL-XL) 25 MG 24 hr tablet Take 0.5 tablets (12.5 mg total) by mouth daily. 90 tablet 1  . multivitamin-iron-minerals-folic acid (CENTRUM) chewable tablet Chew 1 tablet by mouth daily.    . nitroGLYCERIN (NITROSTAT) 0.4 MG SL tablet Place 1 tablet under the tongue every 5 (five) minutes as needed for chest pain.   98  . pantoprazole (PROTONIX) 20 MG tablet Take 20 mg by mouth daily.    . Probiotic Product ([place]) CAPS Take 1 capsule by mouth in the morning.    . prochlorperazine (COMPAZINE) 10 MG tablet Take 1 tablet (10 mg total) by mouth every 6 (six) hours as needed (Nausea or vomiting). 60 tablet 2  . ondansetron (ZOFRAN) 8 MG tablet Take 1 tablet  (8 mg total) by mouth every 8 (eight) hours as needed for nausea. 60 tablet 2   No current facility-administered medications for this visit.    OBJECTIVE: Vitals:   07/31/20 1052  BP: 110/70  Pulse: 65  Resp: 18  Temp: 98 F (36.7 C)  SpO2: (!) 88%     Body mass index is 17.43 kg/m.    ECOG FS:3 - Symptomatic, >50% confined to bed  General: Thin, no acute distress. Eyes: Pink conjunctiva, anicteric sclera. HEENT: Normocephalic, moist mucous membranes. Lungs: No audible wheezing or coughing. Heart: Regular rate and rhythm. Abdomen: Soft, nontender, no obvious distention. Musculoskeletal: No edema, cyanosis, or clubbing. Neuro: Alert, answering all questions appropriately. Cranial nerves grossly intact. Skin: No rashes or petechiae noted. Psych: Normal affect.  LAB RESULTS:  Lab Results  Component Value Date   NA 140 07/25/2020   K 4.6 07/25/2020   CL 102 07/25/2020   CO2 25 07/25/2020   GLUCOSE 83 07/25/2020   BUN 26 07/25/2020   CREATININE 1.28 (H) 07/25/2020   CALCIUM 8.6 07/25/2020   PROT 7.8 07/25/2020   ALBUMIN 3.0 (L) 07/25/2020   AST 50 (H) 07/25/2020   ALT 48 (H) 07/25/2020   ALKPHOS 93 07/25/2020   BILITOT 0.4 07/25/2020   GFRNONAA 55 (L) 07/25/2020   GFRAA 64 07/25/2020    Lab Results  Component Value Date   WBC 5.5 07/27/2020   NEUTROABS 4.2 07/27/2020   HGB 9.2 (L) 07/27/2020   HCT 30.3 (L) 07/27/2020   MCV 90.7 07/27/2020   PLT 193 07/27/2020     STUDIES: DG Chest 2 View  Result Date: 07/26/2020 CLINICAL DATA:  Multifocal pneumonia, productive cough. EXAM: CHEST - 2 VIEW COMPARISON:  July 10, 2020. FINDINGS: The heart size and mediastinal contours are within normal limits. No pneumothorax is noted. Right subclavian Port-A-Cath is unchanged in position. Stable multiple opacities are noted throughout both lungs consistent with multifocal pneumonia. Small right pleural effusion is noted. The visualized skeletal structures are unremarkable.  IMPRESSION: Stable bilateral multifocal pneumonia. Electronically Signed   By: Marijo Conception M.D.   On: 07/26/2020 08:59   DG Chest 2 View  Result Date: 07/10/2020 CLINICAL DATA:  Recent pneumonia EXAM: CHEST - 2 VIEW COMPARISON:  Chest radiograph June 07, 2020; CT angiogram chest May 21 2020 FINDINGS: Multiple areas of patchy airspace opacity remain throughout the right upper, mid, and lower lung regions with small  right pleural effusion. There is a small left pleural effusion. There is new ill-defined airspace opacity in the left upper and lower lobe regions. Heart is upper normal in size with pulmonary vascularity normal. Port-A-Cath tip is in the superior vena cava. No adenopathy. No bone lesions. IMPRESSION: Multifocal airspace opacity with new areas of infiltrate on the left and stable areas of infiltrate at multiple sites on the right. Appearance is consistent with multifocal pneumonia. Check of COVID-19 status in this regard advised. Small pleural effusions noted bilaterally. Stable cardiac silhouette. Port-A-Cath tip in superior vena cava. No adenopathy evident. These results will be called to the ordering clinician or representative by the Radiologist Assistant, and communication documented in the PACS or [employer]. Electronically Signed   By: Lowella Grip III M.D.   On: 07/10/2020 09:16   NM PET Image Initial (PI) Skull Base To Thigh  Result Date: 07/27/2020 CLINICAL DATA:  Subsequent treatment strategy for esophageal cancer. EXAM: NUCLEAR MEDICINE PET SKULL BASE TO THIGH TECHNIQUE: 6.2 mCi F-18 FDG was injected intravenously. Full-ring PET imaging was performed from the skull base to thigh after the radiotracer. CT data was obtained and used for attenuation correction and anatomic localization. Fasting blood glucose: 75 mg/dl COMPARISON:  CT 05/21/2020, PET-CT 04/27/2020, PET-CT 12/07/2019 FINDINGS: Mediastinal blood pool activity: SUV max 1.6 Liver activity: SUV max 2.2  NECK: No hypermetabolic lymph nodes in the neck. Incidental CT findings: none CHEST: Progressive bilateral nodular foci with interstitial thickening within the LEFT and RIGHT lower lobe and progressing to the RIGHT upper lobe and inferior lingula over comparison exams from April 27, 2020 and May 21, 2020. These nodules reach consolidation within the RIGHT lower lobe. These nodules have associated metabolic activity. For example cluster of nodules in the RIGHT upper lobe with SUV max equal 4.2. LEFT lingular nodule thickening with SUV max equal 2.9. In the medial RIGHT lower lobe nodular focus with SUV max equal 5.5. Mild hypermetabolic lower paratracheal nodes. For example LEFT lower paratracheal node SUV max equal 3.2. Incidental CT findings: A gastric pull-up anatomy. The stomach is fluid-filled. ABDOMEN/PELVIS: Focus of metabolic activity midline in the upper abdomen there is poorly defined on the noncontrast CT with SUV max equal 3.7. No abnormal activity liver. No hypermetabolic abdominopelvic lymph nodes. Incidental CT findings: Little intra-abdominal fat makes evaluation difficult. SKELETON: No focal hypermetabolic activity to suggest skeletal metastasis. Incidental CT findings: none IMPRESSION: 1. Bilateral progressive interstitial thickening, pulmonary nodules and coalescence of nodularity with associated hypermetabolic activity. Differential includes chronic progressive pulmonary infection versus malignancy including lymphangitic spread of carcinoma. Consider bronchoscopy and tissue sampling. 2. Mild mediastinal adenopathy with differential include reactive versus metastatic adenopathy. 3. Large volume of fluid within the gastric pull up. This could indicate risk for aspiration pneumonitis providing source of infection for the pulmonary findings. 4. No clear evidence of metastatic disease in the abdomen pelvis. Electronically Signed   By: Suzy Bouchard M.D.   On: 07/27/2020 15:14    ASSESSMENT:  Recurrent adenocarcinoma of the lower third of the esophagus.  PLAN:    1.  Recurrent adenocarcinoma of the lower third of the esophagus: Patient initially completed 6 cycles of weekly carboplatinum and Taxol on August 21, 2016 and daily XRT completed on August 27, 2016. Patient had his esophagectomy on October 07, 2016.  Patient was then noted to have recurrence and subsequently underwent 12 cycles of FOLFOX completing on March 31, 2020.  PET scan results from July 27, 2020 reviewed independently and reported  as above highly suspicious for recurrent disease.  Bilateral pulmonary nodules may be infectious secondary to persistent left duration.  Patient has an appointment at [facility] later this week for EGD and biopsy.  Patient has expressed understanding that this is actual recurrence he likely would not be able to tolerate additional chemotherapy.  Return to clinic in 2 weeks for further evaluation.   2.  Diarrhea: Patient does not complain of this today. 3.  Thrombocytopenia: Resolved.  4.  Renal insufficiency: Patient's creatinine is 1.28.  Monitor. 5.  Anemia: Hemoglobin is trending down is now 9.2. 6.  Leukocytosis: Resolved.  Patient expressed understanding and was in agreement with this plan. He also understands that He can call clinic at any time with any questions, concerns, or complaints.    [name], MD 08/03/20 6:20 AM

## 2020-07-31 ENCOUNTER — Inpatient Hospital Stay (HOSPITAL_BASED_OUTPATIENT_CLINIC_OR_DEPARTMENT_OTHER): Payer: Medicare Other | Admitting: Oncology

## 2020-07-31 ENCOUNTER — Inpatient Hospital Stay (HOSPITAL_BASED_OUTPATIENT_CLINIC_OR_DEPARTMENT_OTHER): Payer: Medicare Other | Admitting: Hospice and Palliative Medicine

## 2020-07-31 ENCOUNTER — Encounter: Payer: Self-pay | Admitting: Oncology

## 2020-07-31 ENCOUNTER — Other Ambulatory Visit: Payer: Self-pay

## 2020-07-31 VITALS — BP 110/70 | HR 65 | Temp 98.0°F | Resp 18 | Wt 118.0 lb

## 2020-07-31 DIAGNOSIS — R197 Diarrhea, unspecified: Secondary | ICD-10-CM | POA: Diagnosis not present

## 2020-07-31 DIAGNOSIS — Z515 Encounter for palliative care: Secondary | ICD-10-CM

## 2020-07-31 DIAGNOSIS — R634 Abnormal weight loss: Secondary | ICD-10-CM | POA: Diagnosis not present

## 2020-07-31 DIAGNOSIS — I2510 Atherosclerotic heart disease of native coronary artery without angina pectoris: Secondary | ICD-10-CM

## 2020-07-31 DIAGNOSIS — I2583 Coronary atherosclerosis due to lipid rich plaque: Secondary | ICD-10-CM

## 2020-07-31 DIAGNOSIS — D649 Anemia, unspecified: Secondary | ICD-10-CM | POA: Diagnosis not present

## 2020-07-31 DIAGNOSIS — C155 Malignant neoplasm of lower third of esophagus: Secondary | ICD-10-CM

## 2020-07-31 DIAGNOSIS — R630 Anorexia: Secondary | ICD-10-CM | POA: Diagnosis not present

## 2020-07-31 MED ORDER — ONDANSETRON HCL 8 MG PO TABS: 8.0000 mg | ORAL_TABLET | Freq: Three times a day (TID) | ORAL | 2 refills | Status: AC | PRN

## 2020-07-31 NOTE — Progress Notes (Signed)
[place]  [phone] [phone]   Name: Corey Perry Date: 07/31/2020 MRN: [id number]  DOB: Dec 22, 1945  Patient Care Team: Valerie Roys, DO as PCP - General (Family Medicine) [name], MD as PCP - Family Medicine (Family Medicine) [name], MD as Consulting Physician (Oncology) [name], MD as Referring Physician [name], MD as Consulting Physician (Cardiology)    REASON FOR CONSULTATION: Corey Perry is a 74 y.o. male with multiple medical problems including recurrent adenocarcinoma of the esophagus status post chemotherapy/XRT in 2017 with esophagectomy on 10/07/2016.  Patient has history of dysphagia with esophageal stricture requiring dilation.  He has had recent worsening dysphagia with weight loss.  Patient was found to have recurrent adenocarcinoma in the lower third of the esophagus now s/p FOLFOX on active surveillance.    Patient was hospitalized 05/21/2020 -05/24/2020 with sepsis from multifocal pneumonia.  Patient was referred to palliative care to help address goals and manage ongoing symptoms.  SOCIAL HISTORY:     reports that he quit smoking about 16 years ago. His smoking use included cigarettes. He has a 40.00 pack-year smoking history. He has quit using smokeless tobacco.  His smokeless tobacco use included chew. He reports current alcohol use of about 1.0 standard drink of alcohol per week. He reports that he does not use drugs.  Patient is married and lives at home with his wife.  He has a daughter who lives in [place].  Patient had a son who died in his 18A from complications of COPD.  Patient worked in a [employer].  ADVANCE DIRECTIVES:  Does not have  CODE STATUS: DNR/DNI (DNR form completed on 11/24/19)  PAST MEDICAL HISTORY: Past Medical History:  Diagnosis Date  . Abnormal white blood cell (WBC) count    seeing Dr. [name] at  [place] on 10/25  . Anemia   . Atrial fibrillation ([place])   . BPH (benign prostatic hypertrophy)   . Bradycardia   . CAD (coronary artery disease)   . Dysrhythmia    bradycardia - sees Dr. Humphrey Rolls  . Esophageal cancer ([place]) 05/2016  . GERD (gastroesophageal reflux disease)   . History of kidney stones   . Hyperlipidemia   . Hypertension   . Hypothyroidism   . Kidney stone   . Myocardial infarction ([place]) 2005  . Pneumonia   . Pneumonia 06/22/2020  . S/P angioplasty with stent 2005   x 4 vessels  . Sleep apnea    mild-NO CPAP  . Spinal stenosis    lumbar    PAST SURGICAL HISTORY:  Past Surgical History:  Procedure Laterality Date  . BACK SURGERY    . CARDIAC CATHETERIZATION Left 03/12/2016   Procedure: Left Heart Cath and Coronary Angiography;  Surgeon: [name], MD;  Location: [facility];  Service: Cardiovascular;  Laterality: Left;  . CARDIAC CATHETERIZATION N/A 03/12/2016   Procedure: Coronary Stent Intervention;  Surgeon: Yolonda Kida, MD;  Location: [facility];  Service: Cardiovascular;  Laterality: N/A;  . COLONOSCOPY WITH PROPOFOL N/A 07/13/2015   Procedure: COLONOSCOPY WITH PROPOFOL;  Surgeon: Lucilla Lame, MD;  Location: [facility];  Service: Endoscopy;  Laterality: N/A;  . CORONARY ANGIOPLASTY  10/05 and 12/05   4 DES placed  . CYSTOSCOPY W/ RETROGRADES Left 06/15/2019   Procedure: CYSTOSCOPY WITH RETROGRADE PYELOGRAM;  Surgeon: Abbie Sons, MD;  Location: [facility];  Service: Urology;  Laterality: Left;  . CYSTOSCOPY/URETEROSCOPY/HOLMIUM LASER/STENT PLACEMENT Left 06/15/2019   Procedure: CYSTOSCOPY/URETEROSCOPY/HOLMIUM LASER/STENT PLACEMENT;  Surgeon: Abbie Sons, MD;  Location: [facility];  Service: Urology;  Laterality: Left;  . ESOPHAGECTOMY  10/07/2016   @ [facility]  . ESOPHAGOGASTRODUODENOSCOPY (EGD) WITH PROPOFOL N/A 06/14/2016   Procedure: ESOPHAGOGASTRODUODENOSCOPY (EGD) WITH PROPOFOL;  Surgeon: [name], MD;   Location: [facility];  Service: Endoscopy;  Laterality: N/A;  . HERNIA REPAIR Bilateral    x3  . INGUINAL HERNIA REPAIR Right 08/28/2017   Large PerFix plug, recurrent hernia;  Surgeon: Robert Bellow, MD;  Location: [facility];  Service: General;  Laterality: Right;  recurrent  . KIDNEY STONE SURGERY  07/15/2019  . POLYPECTOMY  07/13/2015   Procedure: POLYPECTOMY;  Surgeon: Lucilla Lame, MD;  Location: [facility];  Service: Endoscopy;;  . PORT-A-CATH REMOVAL N/A 01/16/2017   Procedure: REMOVAL PORT-A-CATH;  Surgeon: Olean Ree, MD;  Location: [facility];  Service: General;  Laterality: N/A;  . PORTACATH PLACEMENT N/A 07/08/2016   Procedure: INSERTION PORT-A-CATH;  Surgeon: Olean Ree, MD;  Location: [facility];  Service: General;  Laterality: N/A;  . PORTACATH PLACEMENT Right 08/30/2019   Procedure: INSERTION PORT-A-CATH;  Surgeon: Olean Ree, MD;  Location: [facility];  Service: General;  Laterality: Right;    HEMATOLOGY/ONCOLOGY HISTORY:  Oncology History  Esophageal cancer ([name])  06/23/2016 Initial Diagnosis   Esophageal cancer ([place])   09/01/2019 -  Chemotherapy   The patient had palonosetron (ALOXI) injection 0.25 mg, 0.25 mg, Intravenous,  Once, 12 of 12 cycles Administration: 0.25 mg (09/01/2019), 0.25 mg (09/22/2019), 0.25 mg (11/10/2019), 0.25 mg (11/24/2019), 0.25 mg (10/06/2019), 0.25 mg (10/20/2019), 0.25 mg (12/15/2019), 0.25 mg (01/05/2020), 0.25 mg (01/26/2020), 0.25 mg (02/16/2020), 0.25 mg (03/08/2020), 0.25 mg (03/29/2020) pegfilgrastim-cbqv (UDENYCA) injection 6 mg, 6 mg, Subcutaneous, Once, 10 of 10 cycles Administration: 6 mg (10/08/2019), 6 mg (10/22/2019), 6 mg (11/12/2019), 6 mg (12/17/2019), 6 mg (01/07/2020), 6 mg (01/28/2020), 6 mg (02/18/2020), 6 mg (03/10/2020), 6 mg (03/31/2020) leucovorin 700 mg in dextrose 5 % 250 mL infusion, 704 mg, Intravenous,  Once, 12 of 12 cycles Administration: 700 mg (09/01/2019), 700 mg (11/10/2019), 700 mg (11/24/2019), 700 mg (10/06/2019), 700  mg (10/20/2019), 700 mg (12/15/2019), 700 mg (01/05/2020), 700 mg (01/26/2020), 700 mg (02/16/2020), 700 mg (03/08/2020), 700 mg (03/29/2020) oxaliplatin (ELOXATIN) 150 mg in dextrose 5 % 500 mL chemo infusion, 85 mg/m2 = 150 mg, Intravenous,  Once, 12 of 12 cycles Administration: 150 mg (09/01/2019), 150 mg (09/22/2019), 150 mg (11/10/2019), 150 mg (11/24/2019), 150 mg (10/06/2019), 150 mg (10/20/2019), 150 mg (12/15/2019), 150 mg (01/05/2020), 150 mg (01/26/2020), 150 mg (02/16/2020), 150 mg (03/08/2020), 150 mg (03/29/2020) fluorouracil (ADRUCIL) chemo injection 700 mg, 400 mg/m2 = 700 mg, Intravenous,  Once, 12 of 12 cycles Administration: 700 mg (09/01/2019), 700 mg (09/22/2019), 700 mg (11/10/2019), 700 mg (11/24/2019), 700 mg (10/06/2019), 700 mg (10/20/2019), 700 mg (12/15/2019), 700 mg (01/05/2020), 700 mg (01/26/2020), 700 mg (02/16/2020), 700 mg (03/08/2020), 700 mg (03/29/2020) fluorouracil (ADRUCIL) 4,200 mg in sodium chloride 0.9 % 66 mL chemo infusion, 2,400 mg/m2 = 4,200 mg, Intravenous, 1 Day/Dose, 12 of 12 cycles Administration: 4,200 mg (09/01/2019), 4,200 mg (09/22/2019), 4,200 mg (11/10/2019), 4,200 mg (11/24/2019), 4,200 mg (10/06/2019), 4,200 mg (10/20/2019), 4,200 mg (12/15/2019), 4,200 mg (01/05/2020), 4,200 mg (01/26/2020), 4,200 mg (02/16/2020), 4,200 mg (03/08/2020), 4,200 mg (03/29/2020)  for chemotherapy treatment.      ALLERGIES:  has No Known Allergies.  MEDICATIONS:  Current Outpatient Medications  Medication Sig  Dispense Refill  . acetaminophen (TYLENOL) 500 MG tablet Take 500 mg by mouth every 6 (six) hours as needed for mild pain or moderate pain.    Marland Kitchen amoxicillin-clavulanate (AUGMENTIN) 875-125 MG tablet Take 1 tablet by mouth 2 (two) times daily. 28 tablet 0  . aspirin 81 MG tablet Take 81 mg by mouth daily.     Marland Kitchen atorvastatin (LIPITOR) 40 MG tablet Take 1 tablet (40 mg total) by mouth daily. 90 tablet 1  . clopidogrel (PLAVIX) 75 MG tablet Take 1 tablet (75 mg total) by mouth daily. 90 tablet 3  .  diphenoxylate-atropine (LOMOTIL) 2.5-0.025 MG tablet Take 2 tablets by mouth 4 (four) times daily as needed for diarrhea or loose stools. 30 tablet 0  . feeding supplement, ENSURE ENLIVE, (ENSURE ENLIVE) LIQD Take 237 mLs by mouth 2 (two) times daily between meals. 237 mL 12  . levothyroxine (SYNTHROID) 50 MCG tablet Take 1 tablet (50 mcg total) by mouth daily. 90 tablet 1  . loperamide (IMODIUM A-D) 2 MG tablet Take 2 mg by mouth 4 (four) times daily as needed for diarrhea or loose stools.     . metoprolol succinate (TOPROL-XL) 25 MG 24 hr tablet Take 0.5 tablets (12.5 mg total) by mouth daily. 90 tablet 1  . multivitamin-iron-minerals-folic acid (CENTRUM) chewable tablet Chew 1 tablet by mouth daily.    . nitroGLYCERIN (NITROSTAT) 0.4 MG SL tablet Place 1 tablet under the tongue every 5 (five) minutes as needed for chest pain.   98  . ondansetron (ZOFRAN) 8 MG tablet Take 1 tablet (8 mg total) by mouth 2 (two) times daily as needed for refractory nausea / vomiting. Start on day 3 after chemotherapy. 60 tablet 2  . pantoprazole (PROTONIX) 20 MG tablet Take 20 mg by mouth daily.    . Probiotic Product ([place]) CAPS Take 1 capsule by mouth in the morning.    . prochlorperazine (COMPAZINE) 10 MG tablet Take 1 tablet (10 mg total) by mouth every 6 (six) hours as needed (Nausea or vomiting). 60 tablet 2   No current facility-administered medications for this visit.    VITAL SIGNS: There were no vitals taken for this visit. There were no vitals filed for this visit.  Estimated body mass index is 17.43 kg/m as calculated from the following:   Height as of 07/25/20: 5\' 9"  (1.753 m).   Weight as of an earlier encounter on 07/31/20: 118 lb (53.5 kg).  LABS: CBC:    Component Value Date/Time   WBC 5.5 07/27/2020 0920   HGB 9.2 (L) 07/27/2020 0920   HGB 9.2 (L) 07/25/2020 1403   HCT 30.3 (L) 07/27/2020 0920   HCT 30.1 (L) 07/25/2020 1403   PLT 193 07/27/2020 0920   PLT 222  07/25/2020 1403   MCV 90.7 07/27/2020 0920   MCV 91 07/25/2020 1403   NEUTROABS 4.2 07/27/2020 0920   NEUTROABS 5.3 07/25/2020 1403   LYMPHSABS 0.6 (L) 07/27/2020 0920   LYMPHSABS 0.8 07/25/2020 1403   MONOABS 0.5 07/27/2020 0920   EOSABS 0.1 07/27/2020 0920   EOSABS 0.1 07/25/2020 1403   BASOSABS 0.0 07/27/2020 0920   BASOSABS 0.0 07/25/2020 1403   Comprehensive Metabolic Panel:    Component Value Date/Time   NA 140 07/25/2020 1403   K 4.6 07/25/2020 1403   CL 102 07/25/2020 1403   CO2 25 07/25/2020 1403   BUN 26 07/25/2020 1403   CREATININE 1.28 (H) 07/25/2020 1403   GLUCOSE 83 07/25/2020 1403  GLUCOSE 95 05/24/2020 0705   CALCIUM 8.6 07/25/2020 1403   AST 50 (H) 07/25/2020 1403   AST 31 03/15/2019 1022   ALT 48 (H) 07/25/2020 1403   ALT 28 03/15/2019 1022   ALKPHOS 93 07/25/2020 1403   BILITOT 0.4 07/25/2020 1403   PROT 7.8 07/25/2020 1403   ALBUMIN 3.0 (L) 07/25/2020 1403    RADIOGRAPHIC STUDIES: DG Chest 2 View  Result Date: 07/26/2020 CLINICAL DATA:  Multifocal pneumonia, productive cough. EXAM: CHEST - 2 VIEW COMPARISON:  July 10, 2020. FINDINGS: The heart size and mediastinal contours are within normal limits. No pneumothorax is noted. Right subclavian Port-A-Cath is unchanged in position. Stable multiple opacities are noted throughout both lungs consistent with multifocal pneumonia. Small right pleural effusion is noted. The visualized skeletal structures are unremarkable. IMPRESSION: Stable bilateral multifocal pneumonia. Electronically Signed   By: Marijo Conception M.D.   On: 07/26/2020 08:59   DG Chest 2 View  Result Date: 07/10/2020 CLINICAL DATA:  Recent pneumonia EXAM: CHEST - 2 VIEW COMPARISON:  Chest radiograph June 07, 2020; CT angiogram chest May 21 2020 FINDINGS: Multiple areas of patchy airspace opacity remain throughout the right upper, mid, and lower lung regions with small right pleural effusion. There is a small left pleural effusion.  There is new ill-defined airspace opacity in the left upper and lower lobe regions. Heart is upper normal in size with pulmonary vascularity normal. Port-A-Cath tip is in the superior vena cava. No adenopathy. No bone lesions. IMPRESSION: Multifocal airspace opacity with new areas of infiltrate on the left and stable areas of infiltrate at multiple sites on the right. Appearance is consistent with multifocal pneumonia. Check of COVID-19 status in this regard advised. Small pleural effusions noted bilaterally. Stable cardiac silhouette. Port-A-Cath tip in superior vena cava. No adenopathy evident. These results will be called to the ordering clinician or representative by the Radiologist Assistant, and communication documented in the PACS or [employer]. Electronically Signed   By: Lowella Grip III M.D.   On: 07/10/2020 09:16   NM PET Image Initial (PI) Skull Base To Thigh  Result Date: 07/27/2020 CLINICAL DATA:  Subsequent treatment strategy for esophageal cancer. EXAM: NUCLEAR MEDICINE PET SKULL BASE TO THIGH TECHNIQUE: 6.2 mCi F-18 FDG was injected intravenously. Full-ring PET imaging was performed from the skull base to thigh after the radiotracer. CT data was obtained and used for attenuation correction and anatomic localization. Fasting blood glucose: 75 mg/dl COMPARISON:  CT 05/21/2020, PET-CT 04/27/2020, PET-CT 12/07/2019 FINDINGS: Mediastinal blood pool activity: SUV max 1.6 Liver activity: SUV max 2.2 NECK: No hypermetabolic lymph nodes in the neck. Incidental CT findings: none CHEST: Progressive bilateral nodular foci with interstitial thickening within the LEFT and RIGHT lower lobe and progressing to the RIGHT upper lobe and inferior lingula over comparison exams from April 27, 2020 and May 21, 2020. These nodules reach consolidation within the RIGHT lower lobe. These nodules have associated metabolic activity. For example cluster of nodules in the RIGHT upper lobe with SUV max equal  4.2. LEFT lingular nodule thickening with SUV max equal 2.9. In the medial RIGHT lower lobe nodular focus with SUV max equal 5.5. Mild hypermetabolic lower paratracheal nodes. For example LEFT lower paratracheal node SUV max equal 3.2. Incidental CT findings: A gastric pull-up anatomy. The stomach is fluid-filled. ABDOMEN/PELVIS: Focus of metabolic activity midline in the upper abdomen there is poorly defined on the noncontrast CT with SUV max equal 3.7. No abnormal activity liver. No hypermetabolic abdominopelvic lymph  nodes. Incidental CT findings: Little intra-abdominal fat makes evaluation difficult. SKELETON: No focal hypermetabolic activity to suggest skeletal metastasis. Incidental CT findings: none IMPRESSION: 1. Bilateral progressive interstitial thickening, pulmonary nodules and coalescence of nodularity with associated hypermetabolic activity. Differential includes chronic progressive pulmonary infection versus malignancy including lymphangitic spread of carcinoma. Consider bronchoscopy and tissue sampling. 2. Mild mediastinal adenopathy with differential include reactive versus metastatic adenopathy. 3. Large volume of fluid within the gastric pull up. This could indicate risk for aspiration pneumonitis providing source of infection for the pulmonary findings. 4. No clear evidence of metastatic disease in the abdomen pelvis. Electronically Signed   By: Suzy Bouchard M.D.   On: 07/27/2020 15:14    PERFORMANCE STATUS (ECOG) : 1 - Symptomatic but completely ambulatory  Review of Systems Unless otherwise noted, a complete review of systems is negative.  Physical Exam General: NAD Pulmonary: Unlabored Extremities: no edema, no joint deformities Skin: no rashes Neurological: Weakness but otherwise nonfocal  IMPRESSION: Routine follow-up visit.  Patient appears to have declined significantly since last seen.  He has had recurrent aspiration pneumonia and is on current antibiotics for same.   He is now on oxygen due to persistent hypoxia.  He is being followed by pulmonary.  Patient has had worse nausea and dysphagia recently.  He is pending evaluation at [facility] for EGD later this week.  Plan is to bring him back in 2 weeks for evaluation.  Unfortunately, patient is not felt to be a viable candidate for more systemic treatment in the event of recurrence.  Patient speaks about his end-of-life candidly and says that he is not afraid to die.   PLAN: -Continue current scope of treatment -Refill ondansetron -RTC in 2 weeks  Case and plan discussed with Dr. [name]   Patient expressed understanding and was in agreement with this plan. He also understands that He can call the clinic at any time with any questions, concerns, or complaints.     Time Total: 15 minutes  Visit consisted of counseling and education dealing with the complex and emotionally intense issues of symptom management and palliative care in the setting of serious and potentially life-threatening illness.Greater than 50%  of this time was spent counseling and coordinating care related to the above assessment and plan.  Signed by: Altha Harm, PhD, NP-C

## 2020-08-02 DIAGNOSIS — Z79899 Other long term (current) drug therapy: Secondary | ICD-10-CM | POA: Diagnosis not present

## 2020-08-02 DIAGNOSIS — C155 Malignant neoplasm of lower third of esophagus: Secondary | ICD-10-CM | POA: Diagnosis not present

## 2020-08-02 DIAGNOSIS — I2510 Atherosclerotic heart disease of native coronary artery without angina pectoris: Secondary | ICD-10-CM | POA: Diagnosis not present

## 2020-08-02 DIAGNOSIS — K222 Esophageal obstruction: Secondary | ICD-10-CM | POA: Diagnosis not present

## 2020-08-02 DIAGNOSIS — Z955 Presence of coronary angioplasty implant and graft: Secondary | ICD-10-CM | POA: Diagnosis not present

## 2020-08-02 DIAGNOSIS — Z923 Personal history of irradiation: Secondary | ICD-10-CM | POA: Diagnosis not present

## 2020-08-02 DIAGNOSIS — Z8701 Personal history of pneumonia (recurrent): Secondary | ICD-10-CM | POA: Diagnosis not present

## 2020-08-02 DIAGNOSIS — Z87891 Personal history of nicotine dependence: Secondary | ICD-10-CM | POA: Diagnosis not present

## 2020-08-02 DIAGNOSIS — K219 Gastro-esophageal reflux disease without esophagitis: Secondary | ICD-10-CM | POA: Diagnosis not present

## 2020-08-02 DIAGNOSIS — Z01818 Encounter for other preprocedural examination: Secondary | ICD-10-CM | POA: Diagnosis not present

## 2020-08-02 DIAGNOSIS — Z7902 Long term (current) use of antithrombotics/antiplatelets: Secondary | ICD-10-CM | POA: Diagnosis not present

## 2020-08-02 DIAGNOSIS — Z9221 Personal history of antineoplastic chemotherapy: Secondary | ICD-10-CM | POA: Diagnosis not present

## 2020-08-02 DIAGNOSIS — I10 Essential (primary) hypertension: Secondary | ICD-10-CM | POA: Diagnosis not present

## 2020-08-03 DIAGNOSIS — Z9049 Acquired absence of other specified parts of digestive tract: Secondary | ICD-10-CM | POA: Diagnosis not present

## 2020-08-03 DIAGNOSIS — C155 Malignant neoplasm of lower third of esophagus: Secondary | ICD-10-CM | POA: Diagnosis not present

## 2020-08-03 DIAGNOSIS — Z955 Presence of coronary angioplasty implant and graft: Secondary | ICD-10-CM | POA: Diagnosis not present

## 2020-08-03 DIAGNOSIS — Z87891 Personal history of nicotine dependence: Secondary | ICD-10-CM | POA: Diagnosis not present

## 2020-08-03 DIAGNOSIS — K3189 Other diseases of stomach and duodenum: Secondary | ICD-10-CM | POA: Diagnosis not present

## 2020-08-03 DIAGNOSIS — Z8501 Personal history of malignant neoplasm of esophagus: Secondary | ICD-10-CM | POA: Diagnosis not present

## 2020-08-03 DIAGNOSIS — Z8701 Personal history of pneumonia (recurrent): Secondary | ICD-10-CM | POA: Diagnosis not present

## 2020-08-03 DIAGNOSIS — Z7902 Long term (current) use of antithrombotics/antiplatelets: Secondary | ICD-10-CM | POA: Diagnosis not present

## 2020-08-03 DIAGNOSIS — K311 Adult hypertrophic pyloric stenosis: Secondary | ICD-10-CM | POA: Diagnosis not present

## 2020-08-03 DIAGNOSIS — Z79899 Other long term (current) drug therapy: Secondary | ICD-10-CM | POA: Diagnosis not present

## 2020-08-03 DIAGNOSIS — Z7982 Long term (current) use of aspirin: Secondary | ICD-10-CM | POA: Diagnosis not present

## 2020-08-03 DIAGNOSIS — I2510 Atherosclerotic heart disease of native coronary artery without angina pectoris: Secondary | ICD-10-CM | POA: Diagnosis not present

## 2020-08-03 DIAGNOSIS — K30 Functional dyspepsia: Secondary | ICD-10-CM | POA: Diagnosis not present

## 2020-08-09 ENCOUNTER — Inpatient Hospital Stay
Admission: EM | Admit: 2020-08-09 | Discharge: 2020-08-14 | DRG: 177 | Disposition: A | Discharge status: Hospice | Payer: Medicare Other | Attending: Family Medicine | Admitting: Family Medicine

## 2020-08-09 ENCOUNTER — Other Ambulatory Visit: Payer: Self-pay

## 2020-08-09 ENCOUNTER — Encounter: Payer: Self-pay | Admitting: Medical Oncology

## 2020-08-09 ENCOUNTER — Emergency Department: Payer: Medicare Other

## 2020-08-09 DIAGNOSIS — R1115 Cyclical vomiting syndrome unrelated to migraine: Secondary | ICD-10-CM | POA: Diagnosis not present

## 2020-08-09 DIAGNOSIS — D638 Anemia in other chronic diseases classified elsewhere: Secondary | ICD-10-CM | POA: Diagnosis present

## 2020-08-09 DIAGNOSIS — Z841 Family history of disorders of kidney and ureter: Secondary | ICD-10-CM

## 2020-08-09 DIAGNOSIS — C155 Malignant neoplasm of lower third of esophagus: Secondary | ICD-10-CM | POA: Diagnosis present

## 2020-08-09 DIAGNOSIS — I959 Hypotension, unspecified: Secondary | ICD-10-CM | POA: Diagnosis not present

## 2020-08-09 DIAGNOSIS — Z7189 Other specified counseling: Secondary | ICD-10-CM | POA: Diagnosis not present

## 2020-08-09 DIAGNOSIS — R0602 Shortness of breath: Secondary | ICD-10-CM | POA: Diagnosis not present

## 2020-08-09 DIAGNOSIS — E039 Hypothyroidism, unspecified: Secondary | ICD-10-CM | POA: Diagnosis present

## 2020-08-09 DIAGNOSIS — Z66 Do not resuscitate: Secondary | ICD-10-CM

## 2020-08-09 DIAGNOSIS — R040 Epistaxis: Secondary | ICD-10-CM | POA: Diagnosis not present

## 2020-08-09 DIAGNOSIS — Z87442 Personal history of urinary calculi: Secondary | ICD-10-CM

## 2020-08-09 DIAGNOSIS — R64 Cachexia: Secondary | ICD-10-CM | POA: Diagnosis present

## 2020-08-09 DIAGNOSIS — Z79899 Other long term (current) drug therapy: Secondary | ICD-10-CM

## 2020-08-09 DIAGNOSIS — Z8501 Personal history of malignant neoplasm of esophagus: Secondary | ICD-10-CM | POA: Diagnosis not present

## 2020-08-09 DIAGNOSIS — Z9221 Personal history of antineoplastic chemotherapy: Secondary | ICD-10-CM | POA: Diagnosis not present

## 2020-08-09 DIAGNOSIS — Z9049 Acquired absence of other specified parts of digestive tract: Secondary | ICD-10-CM | POA: Diagnosis not present

## 2020-08-09 DIAGNOSIS — Z79890 Hormone replacement therapy (postmenopausal): Secondary | ICD-10-CM

## 2020-08-09 DIAGNOSIS — R634 Abnormal weight loss: Secondary | ICD-10-CM | POA: Diagnosis not present

## 2020-08-09 DIAGNOSIS — E1143 Type 2 diabetes mellitus with diabetic autonomic (poly)neuropathy: Secondary | ICD-10-CM | POA: Diagnosis present

## 2020-08-09 DIAGNOSIS — E876 Hypokalemia: Secondary | ICD-10-CM | POA: Diagnosis not present

## 2020-08-09 DIAGNOSIS — Z20822 Contact with and (suspected) exposure to covid-19: Secondary | ICD-10-CM | POA: Diagnosis present

## 2020-08-09 DIAGNOSIS — I4891 Unspecified atrial fibrillation: Secondary | ICD-10-CM | POA: Diagnosis present

## 2020-08-09 DIAGNOSIS — Z923 Personal history of irradiation: Secondary | ICD-10-CM

## 2020-08-09 DIAGNOSIS — K219 Gastro-esophageal reflux disease without esophagitis: Secondary | ICD-10-CM | POA: Diagnosis present

## 2020-08-09 DIAGNOSIS — C159 Malignant neoplasm of esophagus, unspecified: Secondary | ICD-10-CM | POA: Diagnosis not present

## 2020-08-09 DIAGNOSIS — J189 Pneumonia, unspecified organism: Secondary | ICD-10-CM | POA: Diagnosis not present

## 2020-08-09 DIAGNOSIS — Z9981 Dependence on supplemental oxygen: Secondary | ICD-10-CM

## 2020-08-09 DIAGNOSIS — R54 Age-related physical debility: Secondary | ICD-10-CM | POA: Diagnosis present

## 2020-08-09 DIAGNOSIS — I700 Atherosclerosis of aorta: Secondary | ICD-10-CM | POA: Diagnosis not present

## 2020-08-09 DIAGNOSIS — I252 Old myocardial infarction: Secondary | ICD-10-CM

## 2020-08-09 DIAGNOSIS — R1310 Dysphagia, unspecified: Secondary | ICD-10-CM | POA: Diagnosis present

## 2020-08-09 DIAGNOSIS — E785 Hyperlipidemia, unspecified: Secondary | ICD-10-CM | POA: Diagnosis present

## 2020-08-09 DIAGNOSIS — I10 Essential (primary) hypertension: Secondary | ICD-10-CM | POA: Diagnosis present

## 2020-08-09 DIAGNOSIS — N200 Calculus of kidney: Secondary | ICD-10-CM | POA: Diagnosis not present

## 2020-08-09 DIAGNOSIS — K3184 Gastroparesis: Secondary | ICD-10-CM | POA: Diagnosis not present

## 2020-08-09 DIAGNOSIS — R042 Hemoptysis: Secondary | ICD-10-CM | POA: Diagnosis not present

## 2020-08-09 DIAGNOSIS — J9621 Acute and chronic respiratory failure with hypoxia: Secondary | ICD-10-CM | POA: Diagnosis not present

## 2020-08-09 DIAGNOSIS — D729 Disorder of white blood cells, unspecified: Secondary | ICD-10-CM

## 2020-08-09 DIAGNOSIS — K529 Noninfective gastroenteritis and colitis, unspecified: Secondary | ICD-10-CM | POA: Diagnosis present

## 2020-08-09 DIAGNOSIS — Z681 Body mass index (BMI) 19 or less, adult: Secondary | ICD-10-CM | POA: Diagnosis not present

## 2020-08-09 DIAGNOSIS — E860 Dehydration: Secondary | ICD-10-CM | POA: Diagnosis not present

## 2020-08-09 DIAGNOSIS — Z515 Encounter for palliative care: Secondary | ICD-10-CM | POA: Diagnosis not present

## 2020-08-09 DIAGNOSIS — I2510 Atherosclerotic heart disease of native coronary artery without angina pectoris: Secondary | ICD-10-CM | POA: Diagnosis present

## 2020-08-09 DIAGNOSIS — Z8249 Family history of ischemic heart disease and other diseases of the circulatory system: Secondary | ICD-10-CM

## 2020-08-09 DIAGNOSIS — Z955 Presence of coronary angioplasty implant and graft: Secondary | ICD-10-CM

## 2020-08-09 DIAGNOSIS — J690 Pneumonitis due to inhalation of food and vomit: Secondary | ICD-10-CM | POA: Diagnosis present

## 2020-08-09 DIAGNOSIS — R079 Chest pain, unspecified: Secondary | ICD-10-CM | POA: Diagnosis not present

## 2020-08-09 DIAGNOSIS — R1110 Vomiting, unspecified: Secondary | ICD-10-CM | POA: Diagnosis present

## 2020-08-09 DIAGNOSIS — Z833 Family history of diabetes mellitus: Secondary | ICD-10-CM

## 2020-08-09 DIAGNOSIS — Z7902 Long term (current) use of antithrombotics/antiplatelets: Secondary | ICD-10-CM

## 2020-08-09 DIAGNOSIS — I714 Abdominal aortic aneurysm, without rupture: Secondary | ICD-10-CM | POA: Diagnosis not present

## 2020-08-09 DIAGNOSIS — R112 Nausea with vomiting, unspecified: Secondary | ICD-10-CM | POA: Diagnosis not present

## 2020-08-09 DIAGNOSIS — N2889 Other specified disorders of kidney and ureter: Secondary | ICD-10-CM | POA: Diagnosis not present

## 2020-08-09 DIAGNOSIS — Z87891 Personal history of nicotine dependence: Secondary | ICD-10-CM

## 2020-08-09 DIAGNOSIS — J90 Pleural effusion, not elsewhere classified: Secondary | ICD-10-CM | POA: Diagnosis not present

## 2020-08-09 DIAGNOSIS — Z7982 Long term (current) use of aspirin: Secondary | ICD-10-CM

## 2020-08-09 LAB — COMPREHENSIVE METABOLIC PANEL
ALT: 25 U/L (ref 0–44)
AST: 32 U/L (ref 15–41)
Albumin: 2.2 g/dL — ABNORMAL LOW (ref 3.5–5.0)
Alkaline Phosphatase: 68 U/L (ref 38–126)
Anion gap: 8 (ref 5–15)
BUN: 34 mg/dL — ABNORMAL HIGH (ref 8–23)
CO2: 32 mmol/L (ref 22–32)
Calcium: 8.7 mg/dL — ABNORMAL LOW (ref 8.9–10.3)
Chloride: 97 mmol/L — ABNORMAL LOW (ref 98–111)
Creatinine, Ser: 1.46 mg/dL — ABNORMAL HIGH (ref 0.61–1.24)
GFR, Estimated: 50 mL/min — ABNORMAL LOW (ref >60)
Glucose, Bld: 134 mg/dL — ABNORMAL HIGH (ref 70–99)
Potassium: 4.5 mmol/L (ref 3.5–5.1)
Sodium: 137 mmol/L (ref 135–145)
Total Bilirubin: 0.7 mg/dL (ref 0.3–1.2)
Total Protein: 8.1 g/dL (ref 6.5–8.1)

## 2020-08-09 LAB — CBC
HCT: 31.5 % — ABNORMAL LOW (ref 39.0–52.0)
Hemoglobin: 9.4 g/dL — ABNORMAL LOW (ref 13.0–17.0)
MCH: 27.2 pg (ref 26.0–34.0)
MCHC: 29.8 g/dL — ABNORMAL LOW (ref 30.0–36.0)
MCV: 91 fL (ref 80.0–100.0)
Platelets: 180 10*3/uL (ref 150–400)
RBC: 3.46 MIL/uL — ABNORMAL LOW (ref 4.22–5.81)
RDW: 18.9 % — ABNORMAL HIGH (ref 11.5–15.5)
WBC: 12.1 10*3/uL — ABNORMAL HIGH (ref 4.0–10.5)
nRBC: 0 % (ref 0.0–0.2)

## 2020-08-09 LAB — PROCALCITONIN: Procalcitonin: 0.2 ng/mL

## 2020-08-09 LAB — LIPASE, BLOOD: Lipase: 28 U/L (ref 11–51)

## 2020-08-09 LAB — RESP PANEL BY RT-PCR (FLU A&B, COVID) ARPGX2
Influenza A by PCR: NEGATIVE
Influenza B by PCR: NEGATIVE
SARS Coronavirus 2 by RT PCR: NEGATIVE

## 2020-08-09 MED ORDER — IOHEXOL 300 MG/ML  SOLN
75.0000 mL | Freq: Once | INTRAMUSCULAR | Status: AC | PRN
  Administered 2020-08-09: 75 mL via INTRAVENOUS

## 2020-08-09 MED ORDER — SODIUM CHLORIDE 0.9 % IV SOLN: 500.0000 mg | INTRAVENOUS | Status: DC

## 2020-08-09 MED ORDER — ACETAMINOPHEN 325 MG PO TABS
325.0000 mg | ORAL_TABLET | Freq: Four times a day (QID) | ORAL | Status: DC | PRN
  Administered 2020-08-10 – 2020-08-12 (×5): 325 mg via ORAL
  Filled 2020-08-09 (×5): qty 1

## 2020-08-09 MED ORDER — PROCHLORPERAZINE EDISYLATE 10 MG/2ML IJ SOLN
10.0000 mg | Freq: Once | INTRAMUSCULAR | Status: AC
  Administered 2020-08-09: 10 mg via INTRAVENOUS
  Filled 2020-08-09: qty 2

## 2020-08-09 MED ORDER — ACETAMINOPHEN 650 MG RE SUPP: 325.0000 mg | Freq: Four times a day (QID) | RECTAL | Status: DC | PRN

## 2020-08-09 MED ORDER — IOHEXOL 9 MG/ML PO SOLN
1000.0000 mL | Freq: Once | ORAL | Status: AC | PRN
  Administered 2020-08-09: 1000 mL via ORAL

## 2020-08-09 MED ORDER — CLOPIDOGREL BISULFATE 75 MG PO TABS
75.0000 mg | ORAL_TABLET | Freq: Every day | ORAL | Status: DC
  Administered 2020-08-10 – 2020-08-14 (×4): 75 mg via ORAL
  Filled 2020-08-09 (×4): qty 1

## 2020-08-09 MED ORDER — PIPERACILLIN-TAZOBACTAM 3.375 G IVPB
3.3750 g | Freq: Three times a day (TID) | INTRAVENOUS | Status: DC
  Administered 2020-08-09 – 2020-08-10 (×3): 3.375 g via INTRAVENOUS
  Filled 2020-08-09 (×6): qty 50

## 2020-08-09 MED ORDER — LOPERAMIDE HCL 2 MG PO CAPS: 2.0000 mg | ORAL_CAPSULE | Freq: Four times a day (QID) | ORAL | Status: DC | PRN

## 2020-08-09 MED ORDER — LACTATED RINGERS IV BOLUS
1000.0000 mL | Freq: Once | INTRAVENOUS | Status: AC
  Administered 2020-08-09: 1000 mL via INTRAVENOUS

## 2020-08-09 MED ORDER — SODIUM CHLORIDE 0.9 % IV BOLUS
500.0000 mL | Freq: Once | INTRAVENOUS | Status: AC
  Administered 2020-08-09: 500 mL via INTRAVENOUS

## 2020-08-09 MED ORDER — ENOXAPARIN SODIUM 40 MG/0.4ML ~~LOC~~ SOLN
40.0000 mg | SUBCUTANEOUS | Status: DC
  Administered 2020-08-10 – 2020-08-12 (×3): 40 mg via SUBCUTANEOUS
  Filled 2020-08-09 (×4): qty 0.4

## 2020-08-09 MED ORDER — CENTRUM PO CHEW: 1.0000 | CHEWABLE_TABLET | Freq: Every day | ORAL | Status: DC

## 2020-08-09 MED ORDER — LACTATED RINGERS IV SOLN: INTRAVENOUS | Status: AC

## 2020-08-09 MED ORDER — SODIUM CHLORIDE 0.9 % IV SOLN: 1.0000 g | Freq: Once | INTRAVENOUS | Status: DC

## 2020-08-09 MED ORDER — NITROGLYCERIN 0.4 MG SL SUBL: 0.4000 mg | SUBLINGUAL_TABLET | SUBLINGUAL | Status: DC | PRN

## 2020-08-09 MED ORDER — ATORVASTATIN CALCIUM 20 MG PO TABS
40.0000 mg | ORAL_TABLET | Freq: Every day | ORAL | Status: DC
  Administered 2020-08-10 – 2020-08-14 (×5): 40 mg via ORAL
  Filled 2020-08-09 (×5): qty 2

## 2020-08-09 MED ORDER — ADULT MULTIVITAMIN W/MINERALS CH
1.0000 | ORAL_TABLET | Freq: Every day | ORAL | Status: DC
  Administered 2020-08-10 – 2020-08-14 (×5): 1 via ORAL
  Filled 2020-08-09 (×5): qty 1

## 2020-08-09 MED ORDER — ONDANSETRON HCL 4 MG/2ML IJ SOLN: 4.0000 mg | Freq: Four times a day (QID) | INTRAMUSCULAR | Status: DC | PRN

## 2020-08-09 MED ORDER — ONDANSETRON HCL 4 MG PO TABS: 4.0000 mg | ORAL_TABLET | Freq: Four times a day (QID) | ORAL | Status: DC | PRN

## 2020-08-09 MED ORDER — ONDANSETRON HCL 4 MG/2ML IJ SOLN
4.0000 mg | Freq: Once | INTRAMUSCULAR | Status: AC
  Administered 2020-08-09: 4 mg via INTRAVENOUS
  Filled 2020-08-09: qty 2

## 2020-08-09 MED ORDER — ENSURE ENLIVE PO LIQD
237.0000 mL | Freq: Two times a day (BID) | ORAL | Status: DC
  Administered 2020-08-10 – 2020-08-14 (×9): 237 mL via ORAL

## 2020-08-09 MED ORDER — PROCHLORPERAZINE MALEATE 10 MG PO TABS
10.0000 mg | ORAL_TABLET | Freq: Four times a day (QID) | ORAL | Status: DC | PRN
  Filled 2020-08-09: qty 1

## 2020-08-09 MED ORDER — ASPIRIN EC 81 MG PO TBEC
81.0000 mg | DELAYED_RELEASE_TABLET | Freq: Every day | ORAL | Status: DC
  Administered 2020-08-10 – 2020-08-14 (×5): 81 mg via ORAL
  Filled 2020-08-09 (×5): qty 1

## 2020-08-09 MED ORDER — METOPROLOL SUCCINATE ER 25 MG PO TB24
12.5000 mg | ORAL_TABLET | Freq: Every day | ORAL | Status: DC
  Administered 2020-08-10 – 2020-08-14 (×4): 12.5 mg via ORAL
  Filled 2020-08-09 (×4): qty 1

## 2020-08-09 MED ORDER — LEVOTHYROXINE SODIUM 50 MCG PO TABS
50.0000 ug | ORAL_TABLET | Freq: Every day | ORAL | Status: DC
  Administered 2020-08-10 – 2020-08-14 (×5): 50 ug via ORAL
  Filled 2020-08-09 (×5): qty 1

## 2020-08-09 NOTE — ED Triage Notes (Signed)
Pt reports that he was seen recently and diagnosed with pneumonia, pt was placed on ABX. Pt now reports for the past 10 days he has been having vomiting and now feels dehydrated.

## 2020-08-09 NOTE — ED Notes (Addendum)
RN  Curt Bears informed of pt roomed at this time and placed on monitor.

## 2020-08-09 NOTE — ED Notes (Signed)
Assumed care at 2300, attempted to call report to inpatient RN, awaiting RN to call this RN back. Pt resting room AO x4, call bell within reach.

## 2020-08-09 NOTE — ED Notes (Signed)
Patient had 2 episodes of vomiting. MD aware.

## 2020-08-09 NOTE — ED Triage Notes (Signed)
Pt here for weakness possible dehydration.

## 2020-08-09 NOTE — Progress Notes (Signed)
[facility] hospital Liaison note: Patient is currently followed by [facility] outpatient Palliative program at home. TOC Corey Perry made aware. Will follow for disposition.  Flo Shanks BSN, RN, [place] [phone]

## 2020-08-09 NOTE — H&P (Signed)
History and Physical   Corey Perry JME:[id number] DOB: Apr 14, 1946 DOA: 08/09/2020  PCP: Valerie Roys, DO  Outpatient Specialists: [facility], [facility] Patient coming from: home   I have personally briefly reviewed patient's old medical records in [place].  Chief Concern: dehydration   HPI: Corey Perry is a 75 y.o. male with medical history significant for   Ate egg and sausage sandwich before coming to hospital and did not have nausea.  He states he understands that he needs to eat slowly and drink slowly.  He reports that he only vomited when asked to drink the 'medicine' in the ED.   He denies fever, headache, dizziness, new cough, chest pain, shortness of breath, abdominal pain, new back pain, dysuria, hematuria, urgency, urinary retention, increased urinary frequency, diarrhea, constipation, blood in stool, changes in strength.  He endorses nausea and vomiting.  He endorses baseline shortness of breath that is unchanged.  He reports that the symptoms are baseline for him.  He endorses that since the esophageal dilatation procedure on 08/03/2020, he does feel better than prior to the procedure.  Lives with spouse. 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 He was a former tobacco user (started smoking at age 50, quit 2005), 4 stents at [facility] in 2005 and one stent in 2017. Endorses infrequent ETOH use (1/2 glass of whiskey per week).   ED Course: Discussed with ED provider, requesting admission for intractable nausea and vomiting and GI study.   Review of Systems: As per HPI otherwise 10 point review of systems negative.  Assessment/Plan  Active Problems:   Vomiting   Dehydration   Vomiting dehydration-improved at this time -Status post ondansetron -Status post lactated ringer 1 L -Will get 500 cc normal saline bolus -ED provider discussed with GI with consideration of a swallow  study work-up.  -I discussed this with patient and he does not wish to have further procedures today or tomorrow.  He states that he has been dealing with this esophageal cancer for many years and he feels better now than he did 2 or 3 weeks ago.  He just wants IV fluids for hydration and discharged home so that he can enjoy Thanksgiving with his family. -Discussed risk and benefits with patient's regarding possible presentation to the ED again for nausea and vomiting.   Possible Pneumonia Leukocytosis  -Azithromycin 250 mg QID and amoxicillin 875-125 BID at home, holding these for now -Zosyn IV for aspiration pneumonia   Persistent nausea and vomiting  -Esophagus less than 30 mm in diameter, status post EGD balloon dilatation on 08/03/2020 at [facility] -Patient reports minimal improvement in symptoms and spouse is concerned that patient is still vomiting after p.o. intake including water -GI has been consulted, Dr. Allen Norris for possible swallow study -Patient declines feeding tube  History of CAD-aspirin and Plavix  History of A. fib-metoprolol succinate 12.5 daily  Hyperlipidemia-40 mg atorvastatin nightly  Hypothyroid-levothyroxine 50 mcg in the morning  Chart reviewed.   Patient is currently being followed by palliative care outpatient.   Note: He has an outpatient appointment with Dr. [name] on December 1.   DVT Prophylaxis: enoxaparin  Code Status: DNR Diet: Regular Family Communication: discussed and updated spouse, Ms. Deshane Cotroneo and she would like to be updated daily regarding his progress Disposition Plan: pending clinical course  Consults called: ED provider consulted GI, Dr. Allen Norris Admission status: inpatient  Past Medical History:  Diagnosis Date  . Abnormal  white blood cell (WBC) count    seeing Dr. [name] at [place] on 10/25  . Anemia   . Atrial fibrillation ([place])   . BPH (benign prostatic hypertrophy)   . Bradycardia   . CAD (coronary artery disease)   .  Dysrhythmia    bradycardia - sees Dr. Humphrey Rolls  . Esophageal cancer ([place]) 05/2016  . GERD (gastroesophageal reflux disease)   . History of kidney stones   . Hyperlipidemia   . Hypertension   . Hypothyroidism   . Kidney stone   . Myocardial infarction ([place]) 2005  . Pneumonia   . Pneumonia 06/22/2020  . S/P angioplasty with stent 2005   x 4 vessels  . Sleep apnea    mild-NO CPAP  . Spinal stenosis    lumbar   Past Surgical History:  Procedure Laterality Date  . BACK SURGERY    . CARDIAC CATHETERIZATION Left 03/12/2016   Procedure: Left Heart Cath and Coronary Angiography;  Surgeon: [name], MD;  Location: [place] [facility];  Service: Cardiovascular;  Laterality: Left;  . CARDIAC CATHETERIZATION N/A 03/12/2016   Procedure: Coronary Stent Intervention;  Surgeon: Yolonda Kida, MD;  Location: [place] [facility];  Service: Cardiovascular;  Laterality: N/A;  . COLONOSCOPY WITH PROPOFOL N/A 07/13/2015   Procedure: COLONOSCOPY WITH PROPOFOL;  Surgeon: Lucilla Lame, MD;  [place];  Service: Endoscopy;  Laterality: N/A;  . CORONARY ANGIOPLASTY  10/05 and 12/05   4 DES placed  . CYSTOSCOPY W/ RETROGRADES Left 06/15/2019   Procedure: CYSTOSCOPY WITH RETROGRADE PYELOGRAM;  Surgeon: Abbie Sons, MD;  Location: [facility];  Service: Urology;  Laterality: Left;  . CYSTOSCOPY/URETEROSCOPY/HOLMIUM LASER/STENT PLACEMENT Left 06/15/2019   Procedure: CYSTOSCOPY/URETEROSCOPY/HOLMIUM LASER/STENT PLACEMENT;  Surgeon: Abbie Sons, MD;  Location: [facility];  Service: Urology;  Laterality: Left;  . ESOPHAGECTOMY  10/07/2016   @ [facility]  . ESOPHAGOGASTRODUODENOSCOPY (EGD) WITH PROPOFOL N/A 06/14/2016   Procedure: ESOPHAGOGASTRODUODENOSCOPY (EGD) WITH PROPOFOL;  Surgeon: [name], MD;  Location: [facility];  Service: Endoscopy;  Laterality: N/A;  . HERNIA REPAIR Bilateral    x3  . INGUINAL HERNIA REPAIR Right 08/28/2017   Large PerFix plug, recurrent hernia;   Surgeon: Robert Bellow, MD;  Location: [facility];  Service: General;  Laterality: Right;  recurrent  . KIDNEY STONE SURGERY  07/15/2019  . POLYPECTOMY  07/13/2015   Procedure: POLYPECTOMY;  Surgeon: Lucilla Lame, MD;  Location: [place];  Service: Endoscopy;;  . PORT-A-CATH REMOVAL N/A 01/16/2017   Procedure: REMOVAL PORT-A-CATH;  Surgeon: Olean Ree, MD;  Location: [facility];  Service: General;  Laterality: N/A;  . PORTACATH PLACEMENT N/A 07/08/2016   Procedure: INSERTION PORT-A-CATH;  Surgeon: Olean Ree, MD;  Location: [facility];  Service: General;  Laterality: N/A;  . PORTACATH PLACEMENT Right 08/30/2019   Procedure: INSERTION PORT-A-CATH;  Surgeon: Olean Ree, MD;  Location: [facility];  Service: General;  Laterality: Right;   Social History:  reports that he quit smoking about 16 years ago. His smoking use included cigarettes. He has a 40.00 pack-year smoking history. He has quit using smokeless tobacco.  His smokeless tobacco use included chew. He reports current alcohol use of about 1.0 standard drink of alcohol per week. He reports that he does not use drugs.  No Known Allergies Family History  Problem Relation Age of Onset  . Hypertension Mother   . Dementia Mother   . Heart disease Father   . Hypertension Father   .  Kidney disease Father   . Hypertension Brother   . Heart disease Brother   . Diabetes Son   . Hypertension Son   . Hyperlipidemia Son   . Hyperlipidemia Daughter   . Hypertension Daughter    Family history: Family history reviewed and not pertinent  Prior to Admission medications   Medication Sig Start Date End Date Taking? Authorizing Provider  acetaminophen (TYLENOL) 500 MG tablet Take 500 mg by mouth every 6 (six) hours as needed for mild pain or moderate pain.    [provider]  amoxicillin-clavulanate (AUGMENTIN) 875-125 MG tablet Take 1 tablet by mouth 2 (two) times daily. 07/25/20   Tsosie Billing, MD  aspirin 81 MG  tablet Take 81 mg by mouth daily.     [provider]  atorvastatin (LIPITOR) 40 MG tablet Take 1 tablet (40 mg total) by mouth daily. 05/01/20   Park Liter P, DO  clopidogrel (PLAVIX) 75 MG tablet Take 1 tablet (75 mg total) by mouth daily. 05/01/20   Johnson, Megan P, DO  diphenoxylate-atropine (LOMOTIL) 2.5-0.025 MG tablet Take 2 tablets by mouth 4 (four) times daily as needed for diarrhea or loose stools. 03/08/20   [name], MD  feeding supplement, ENSURE ENLIVE, (ENSURE ENLIVE) LIQD Take 237 mLs by mouth 2 (two) times daily between meals. 05/24/20   Raiford Noble Latif, DO  levothyroxine (SYNTHROID) 50 MCG tablet Take 1 tablet (50 mcg total) by mouth daily. 02/15/20   Johnson, Megan P, DO  loperamide (IMODIUM A-D) 2 MG tablet Take 2 mg by mouth 4 (four) times daily as needed for diarrhea or loose stools.     [provider]  metoprolol succinate (TOPROL-XL) 25 MG 24 hr tablet Take 0.5 tablets (12.5 mg total) by mouth daily. 05/24/20   Raiford Noble Latif, DO  multivitamin-iron-minerals-folic acid (CENTRUM) chewable tablet Chew 1 tablet by mouth daily.    [provider]  nitroGLYCERIN (NITROSTAT) 0.4 MG SL tablet Place 1 tablet under the tongue every 5 (five) minutes as needed for chest pain.  03/08/16   [provider]  ondansetron (ZOFRAN) 8 MG tablet Take 1 tablet (8 mg total) by mouth every 8 (eight) hours as needed for nausea. 07/31/20   Borders, Kirt Boys, NP  pantoprazole (PROTONIX) 20 MG tablet Take 20 mg by mouth daily.    [provider]  Probiotic Product ([place]) CAPS Take 1 capsule by mouth in the morning.    [provider]  prochlorperazine (COMPAZINE) 10 MG tablet Take 1 tablet (10 mg total) by mouth every 6 (six) hours as needed (Nausea or vomiting). 08/26/19   [name], MD   Physical Exam: Vitals:   08/09/20 1800 08/09/20 1854 08/09/20 1930 08/09/20 2000  BP: 125/65 109/63 (!) 109/58 (!)  109/56  Pulse:  62 (!) 56 60  Resp:  18  18  Temp:      TempSrc:      SpO2:  92% 96% 97%  Weight:      Height:       Constitutional: appears cachectic and frail, NAD, calm, comfortable Eyes: PERRL, lids and conjunctivae normal ENMT: Mucous membranes are moist. Posterior pharynx clear of any exudate or lesions. Age-appropriate dentition. Hearing appropriate Neck: normal, supple, no masses, no thyromegaly Respiratory: clear to auscultation bilaterally, no wheezing, no crackles. Normal respiratory effort. No accessory muscle use.  Cardiovascular: Regular rate and rhythm, no murmurs / rubs / gallops. No extremity edema. 2+ pedal pulses. No carotid bruits.  Abdomen: Scaphoid abdomen, no tenderness, no masses palpated, no hepatosplenomegaly. Bowel sounds positive.  Musculoskeletal: no clubbing / cyanosis. No joint deformity upper and lower extremities. Good ROM, no contractures, no atrophy. Bilateral lower extremity chronic muscle loss.  Skin: no rashes, lesions, ulcers. No induration Neurologic: Sensation intact. Strength 5/5 in all 4.  Psychiatric: Normal judgment and insight. Alert and oriented x 3. Normal mood.   EKG: not indicated  Chest x-ray on Admission: Personally reviewed and I agree with radiologist reading as below.  CT CHEST ABDOMEN PELVIS W CONTRAST  Result Date: 08/09/2020 CLINICAL DATA:  Nausea and vomiting in a patient with known esophageal cancer. EXAM: CT CHEST, ABDOMEN, AND PELVIS WITH CONTRAST TECHNIQUE: Multidetector CT imaging of the chest, abdomen and pelvis was performed following the standard protocol during bolus administration of intravenous contrast. CONTRAST:  75mL OMNIPAQUE IOHEXOL 300 MG/ML  SOLN COMPARISON:  Multiple prior studies including CT of the abdomen and pelvis from April 22, 2019 FINDINGS: CT CHEST FINDINGS Cardiovascular: Three-vessel coronary artery disease. Heart size is stable. Aortic caliber is stable at approximately 3.6 cm. RIGHT-sided  Port-A-Cath terminates in the mid superior vena cava as on the recent chest CT from 05/21/2020. Central pulmonary vasculature is unremarkable on venous phase assessment. Mediastinum/Nodes: Post esophagectomy with gastric pull-through. Patulous appearance of the pull-through which is filled partially with oral contrast media. No adenopathy in the chest. Lungs/Pleura: Numerous areas of airspace disease with nodular features and patchy areas of ground-glass. This is worse than on the study of May 21, 2020 now with involvement in the LEFT chest as well as the RIGHT and with more extensive involvement in the RIGHT chest when compared to the prior study. There is also slight interval enlargement of a very small RIGHT pleural effusion and interval development of a LEFT pleural effusion since the prior study. Musculoskeletal: See below for full musculoskeletal details. CT ABDOMEN PELVIS FINDINGS Hepatobiliary: No focal, suspicious hepatic lesion. The portal vein is patent. Gallbladder is decompressed. No biliary duct dilation. Pancreas: Soft tissue along the posterior pancreas may have diminished in size but obliterates the normal fat about the celiac axis similar to distribution seen on prior study but again perhaps diminished in size. No intrapancreatic lesion or signs of inflammation. Spleen: Normal Adrenals/Urinary Tract: Normal adrenal glands. Symmetric renal enhancement. No hydronephrosis. The LEFT renal pelvic calculus has migrated slightly further into the renal pelvis measuring approximately 8 mm. Increasing stone burden in the LEFT kidney since previous imaging with another calculus in the interpolar calyx and infundibulum approximately 5 mm. Distal ureters are difficult to assess. Urinary bladder is under distended. Stomach/Bowel: Soft tissue about the hepatic gastric recess measuring approximately 3.4 x 2.1 cm in axial dimension. Less bulky appearance along the LEFT periaortic region where it previously  measured approximately 2.3 cm in greatest thickness and now measures 1.3 cm. Greatest maximal dimension of soft tissue on the prior study was approximately 3.9 by 2.6 cm. No sign of small bowel obstruction. The ingested contrast material passes through the small bowel with early opacification of the cecum which is present in the pelvis. Stool filled colon with signs of colonic diverticulosis. Ascending colonic thickening colonic thickening. Vascular/Lymphatic: Calcified atheromatous plaque in the abdominal aorta. Aneurysmal dilation of the infrarenal abdominal aorta approximately 2.9 cm greatest caliber similar to the prior study accounting for tortuosity in the axial plane. Reproductive: Prostate heterogeneous on CT with nonspecific appearance. Other: Diffuse loss of subcutaneous fat and mesenteric fat since the prior imaging evaluation makes assessment  more difficult. Appendix not visualized without overt signs of acute appendicitis. Musculoskeletal: No acute musculoskeletal process. Spinal degenerative changes. IMPRESSION: 1. Findings of bilateral pneumonia with worsening compared to previous imaging. The possibility of background pulmonary metastatic disease is also considered based on previous PET imaging. There is rapid worsening in some areas, particularly the RIGHT upper lobe and LEFT lung base when compared to the previous more recent PET scan which would support a worsening overlay of infection perhaps from aspiration. 2. Ascending colonic thickening may be related to colitis, correlate with any risk factors for infectious colitis in the setting of recent antibiotic administration. 3. Post esophagectomy with gastric pull-through. Patulous appearance of the pull-through which is filled partially with oral contrast media further supporting the possibility of aspiration in the setting of repeated nausea and vomiting. 4. Decreased soft tissue is suggested in the upper abdomen with limited assessment due to  cachexia and early venous phase on the current study. 5. filling of the cecum with ingested contrast material, cecum now in the pelvis. Appendix not visualized. No secondary signs to suggest acute appendicitis. Assessment is however limited due to lack of intra-abdominal fat. If there is continued pain or symptoms that localized to the pelvis, repeat scanning could be considered after passage of oral contrast further into the colon. 6. Increased stone burden in the LEFT kidney. 7. Aneurysmal dilation of the infrarenal abdominal aorta approximately 2.9 cm greatest caliber similar to the prior study accounting for tortuosity in the axial plane. 8. Cachexia. Aortic Atherosclerosis (ICD10-I70.0). Electronically Signed   By: Zetta Bills M.D.   On: 08/09/2020 19:09   DG Abdomen Acute W/Chest  Result Date: 08/09/2020 CLINICAL DATA:  Vomiting for 10 days EXAM: DG ABDOMEN ACUTE WITH 1 VIEW CHEST COMPARISON:  04/01/2019, 04/22/2019 FINDINGS: There is no evidence of dilated bowel loops or free intraperitoneal air. 8 mm rounded calcification projects in the region of the left renal shadow. Tortuous abdominal aorta with atherosclerotic calcifications. Stable right-sided chest port. Cardiomediastinal contours are within normal limits. Worsening bilateral airspace opacities, most confluent within the right upper lobe and left lower lobe. No large pleural fluid collection. No pneumothorax. IMPRESSION: 1. Worsening multifocal pneumonia. 2. Nonobstructive bowel gas pattern. 3. 8 mm calcification projects in the region of the left renal shadow. Electronically Signed   By: Davina Poke D.O.   On: 08/09/2020 16:28   Labs on Admission: I have personally reviewed following labs  CBC: Recent Labs  Lab 08/09/20 1341  WBC 12.1*  HGB 9.4*  HCT 31.5*  MCV 91.0  PLT 062   Basic Metabolic Panel: Recent Labs  Lab 08/09/20 1341  NA 137  K 4.5  CL 97*  CO2 32  GLUCOSE 134*  BUN 34*  CREATININE 1.46*  CALCIUM  8.7*   GFR: Estimated Creatinine Clearance: 34.1 mL/min (A) (by C-G formula based on SCr of 1.46 mg/dL (H)).  Liver Function Tests: Recent Labs  Lab 08/09/20 1341  AST 32  ALT 25  ALKPHOS 68  BILITOT 0.7  PROT 8.1  ALBUMIN 2.2*   Recent Labs  Lab 08/09/20 1341  LIPASE 28   Urine analysis:    Component Value Date/Time   COLORURINE YELLOW (A) 05/21/2020 2251   APPEARANCEUR CLEAR (A) 05/21/2020 2251   APPEARANCEUR Cloudy (A) 06/23/2019 0822   LABSPEC >1.046 (H) 05/21/2020 2251   PHURINE 5.0 05/21/2020 2251   GLUCOSEU NEGATIVE 05/21/2020 2251   HGBUR NEGATIVE 05/21/2020 2251   BILIRUBINUR NEGATIVE 05/21/2020 2251   BILIRUBINUR Negative  06/23/2019 0822   [place] 05/21/2020 2251   PROTEINUR NEGATIVE 05/21/2020 2251   NITRITE NEGATIVE 05/21/2020 2251   LEUKOCYTESUR NEGATIVE 05/21/2020 2251   Jozsef Wescoat N Torrance Frech D.O. [facility]  If 12AM-7AM, please contact overnight-coverage provider If 7AM-7PM, please contact day coverage provider [email or web address]  08/09/2020, 10:08 PM

## 2020-08-09 NOTE — Progress Notes (Signed)
[place]  [phone]) [phone] Fax:(336) [phone]  ID: Corey Perry OB: Dec 04, 1945  MR#: [id number]  DDU#:[id number]  Patient Care Team: Valerie Roys, DO as PCP - General (Family Medicine) [name], MD as PCP - Family Medicine (Family Medicine) [name], MD as Consulting Physician (Oncology) [name], MD as Referring Physician [name], MD as Consulting Physician (Cardiology)  CHIEF COMPLAINT: Recurrent adenocarcinoma of the lower third of the esophagus.  INTERVAL HISTORY: Patient returns to clinic today for further evaluation and discussion of his EGD results at [facility] recently.  He continues to have a poor appetite, but states this has improved mildly.  He also continues to have aspiration and lose weight.  His performance status continues to decline.  He continues to have chronic weakness and fatigue. He has no neurologic complaints.  He denies any recent fevers or illnesses.  He denies any chest pain, shortness of breath, cough, or hemoptysis.  He denies any nausea, vomiting, or constipation. He has no melena or hematochezia.  He has no urinary complaints.  Patient offers no further specific complaints today.  REVIEW OF SYSTEMS:   Review of Systems  Constitutional: Positive for malaise/fatigue and weight loss. Negative for fever.  HENT: Negative.   Respiratory: Negative.  Negative for cough and shortness of breath.   Cardiovascular: Negative.  Negative for chest pain and leg swelling.  Gastrointestinal: Negative.  Negative for abdominal pain, blood in stool, constipation, diarrhea, melena, nausea and vomiting.  Genitourinary: Negative.  Negative for frequency and urgency.  Musculoskeletal: Negative.  Negative for back pain.  Skin: Negative.  Negative for rash.  Neurological: Positive for weakness. Negative for tingling, focal weakness and headaches.  Psychiatric/Behavioral: Negative.  The patient is not  nervous/anxious and does not have insomnia.     As per HPI. Otherwise, a complete review of systems is negative.  PAST MEDICAL HISTORY: Past Medical History:  Diagnosis Date  . Abnormal white blood cell (WBC) count    seeing Dr. [name] at [place] on 10/25  . Anemia   . Atrial fibrillation ([place])   . BPH (benign prostatic hypertrophy)   . Bradycardia   . CAD (coronary artery disease)   . Diabetic gastroparesis ([place]) 08/14/2020  . Dysrhythmia    bradycardia - sees Dr. Humphrey Rolls  . Esophageal cancer ([place]) 05/2016  . GERD (gastroesophageal reflux disease)   . History of kidney stones   . Hyperlipidemia   . Hypertension   . Hypothyroidism   . Kidney stone   . Myocardial infarction ([place]) 2005  . Pneumonia   . Pneumonia 06/22/2020  . S/P angioplasty with stent 2005   x 4 vessels  . Sleep apnea    mild-NO CPAP  . Spinal stenosis    lumbar    PAST SURGICAL HISTORY: Past Surgical History:  Procedure Laterality Date  . BACK SURGERY    . CARDIAC CATHETERIZATION Left 03/12/2016   Procedure: Left Heart Cath and Coronary Angiography;  Surgeon: [name], MD;  Location: [place];  Service: Cardiovascular;  Laterality: Left;  . CARDIAC CATHETERIZATION N/A 03/12/2016   Procedure: Coronary Stent Intervention;  Surgeon: Yolonda Kida, MD;  Location: [place] [facility];  Service: Cardiovascular;  Laterality: N/A;  . COLONOSCOPY WITH PROPOFOL N/A 07/13/2015   Procedure: COLONOSCOPY WITH PROPOFOL;  Surgeon: Lucilla Lame, MD;  Location: [facility];  Service: Endoscopy;  Laterality: N/A;  . CORONARY ANGIOPLASTY  10/05 and 12/05   4  DES placed  . CYSTOSCOPY W/ RETROGRADES Left 06/15/2019   Procedure: CYSTOSCOPY WITH RETROGRADE PYELOGRAM;  Surgeon: Abbie Sons, MD;  Location: [facility];  Service: Urology;  Laterality: Left;  . CYSTOSCOPY/URETEROSCOPY/HOLMIUM LASER/STENT PLACEMENT Left 06/15/2019   Procedure: CYSTOSCOPY/URETEROSCOPY/HOLMIUM LASER/STENT PLACEMENT;   Surgeon: Abbie Sons, MD;  Location: [facility];  Service: Urology;  Laterality: Left;  . ESOPHAGECTOMY  10/07/2016   @ [facility]  . ESOPHAGOGASTRODUODENOSCOPY (EGD) WITH PROPOFOL N/A 06/14/2016   Procedure: ESOPHAGOGASTRODUODENOSCOPY (EGD) WITH PROPOFOL;  Surgeon: [name], MD;  Location: [facility];  Service: Endoscopy;  Laterality: N/A;  . HERNIA REPAIR Bilateral    x3  . INGUINAL HERNIA REPAIR Right 08/28/2017   Large PerFix plug, recurrent hernia;  Surgeon: Robert Bellow, MD;  Location: [facility];  Service: General;  Laterality: Right;  recurrent  . KIDNEY STONE SURGERY  07/15/2019  . POLYPECTOMY  07/13/2015   Procedure: POLYPECTOMY;  Surgeon: Lucilla Lame, MD;  Location: [place];  Service: Endoscopy;;  . PORT-A-CATH REMOVAL N/A 01/16/2017   Procedure: REMOVAL PORT-A-CATH;  Surgeon: Olean Ree, MD;  Location: [facility];  Service: General;  Laterality: N/A;  . PORTACATH PLACEMENT N/A 07/08/2016   Procedure: INSERTION PORT-A-CATH;  Surgeon: Olean Ree, MD;  Location: [facility];  Service: General;  Laterality: N/A;  . PORTACATH PLACEMENT Right 08/30/2019   Procedure: INSERTION PORT-A-CATH;  Surgeon: Olean Ree, MD;  Location: [facility];  Service: General;  Laterality: Right;    FAMILY HISTORY Family History  Problem Relation Age of Onset  . Hypertension Mother   . Dementia Mother   . Heart disease Father   . Hypertension Father   . Kidney disease Father   . Hypertension Brother   . Heart disease Brother   . Diabetes Son   . Hypertension Son   . Hyperlipidemia Son   . Hyperlipidemia Daughter   . Hypertension Daughter        ADVANCED DIRECTIVES:    HEALTH MAINTENANCE: Social History   Tobacco Use  . Smoking status: Former Smoker    Packs/day: 1.00    Years: 40.00    Pack years: 40.00    Types: Cigarettes    Quit date: 07/16/2004    Years since quitting: 16.0  . Smokeless tobacco: Former Systems developer    Types: Secondary school teacher  . Vaping Use:  Never used  Substance Use Topics  . Alcohol use: Yes    Alcohol/week: 1.0 standard drink    Types: 1 Cans of beer per week  . Drug use: No     Colonoscopy:  PAP:  Bone density:  Lipid panel:  No Known Allergies  Current Outpatient Medications  Medication Sig Dispense Refill  . acetaminophen (TYLENOL) 500 MG tablet Take 500 mg by mouth every 6 (six) hours as needed for mild pain or moderate pain.    Marland Kitchen amoxicillin-clavulanate (AUGMENTIN) 875-125 MG tablet Take 1 tablet by mouth 2 (two) times daily for 5 days. 10 tablet 0  . aspirin 81 MG tablet Take 81 mg by mouth daily.     Marland Kitchen atorvastatin (LIPITOR) 40 MG tablet Take 1 tablet (40 mg total) by mouth daily. 90 tablet 1  . clopidogrel (PLAVIX) 75 MG tablet Take 1 tablet (75 mg total) by mouth daily. 90 tablet 3  . diphenoxylate-atropine (LOMOTIL) 2.5-0.025 MG tablet Take 2 tablets by mouth 4 (four) times daily as needed for diarrhea or loose stools. 30 tablet 0  . erythromycin (E-MYCIN) 250 MG tablet Take 250 mg  by mouth 4 (four) times daily.    . feeding supplement, ENSURE ENLIVE, (ENSURE ENLIVE) LIQD Take 237 mLs by mouth 2 (two) times daily between meals. 237 mL 12  . levothyroxine (SYNTHROID) 50 MCG tablet Take 1 tablet (50 mcg total) by mouth daily. 90 tablet 1  . loperamide (IMODIUM A-D) 2 MG tablet Take 2 mg by mouth 4 (four) times daily as needed for diarrhea or loose stools.     . metoprolol succinate (TOPROL-XL) 25 MG 24 hr tablet Take 0.5 tablets (12.5 mg total) by mouth daily. 90 tablet 1  . multivitamin-iron-minerals-folic acid (CENTRUM) chewable tablet Chew 1 tablet by mouth daily.    . nitroGLYCERIN (NITROSTAT) 0.4 MG SL tablet Place 1 tablet under the tongue every 5 (five) minutes as needed for chest pain.   98  . ondansetron (ZOFRAN) 8 MG tablet Take 1 tablet (8 mg total) by mouth every 8 (eight) hours as needed for nausea. 60 tablet 2  . pantoprazole (PROTONIX) 20 MG tablet Take 20 mg by mouth daily.    Marland Kitchen LORazepam  (ATIVAN) 0.5 MG tablet Take 1 tablet (0.5 mg total) by mouth every 8 (eight) hours as needed for anxiety. 30 tablet 0  . Morphine Sulfate (MORPHINE CONCENTRATE) 10 mg / 0.5 ml concentrated solution Take 0.25 mLs (5 mg total) by mouth every 2 (two) hours as needed for severe pain or shortness of breath. 30 mL 0  . Probiotic Product ([place]) CAPS Take 1 capsule by mouth in the morning. (Patient not taking: Reported on 08/16/2020)     No current facility-administered medications for this visit.    OBJECTIVE: Vitals:   08/16/20 1047  BP: 101/73  Pulse: 83  Resp: 16  Temp: (!) 96.2 F (35.7 C)  SpO2: 99%     Body mass index is 19.09 kg/m.    ECOG FS:3 - Symptomatic, >50% confined to bed  General: Thin, no acute distress.  Sitting in a wheelchair. Eyes: Pink conjunctiva, anicteric sclera. HEENT: Normocephalic, moist mucous membranes. Lungs: No audible wheezing or coughing. Heart: Regular rate and rhythm. Abdomen: Soft, nontender, no obvious distention. Musculoskeletal: Mild peripheral edema. Neuro: Alert, answering all questions appropriately. Cranial nerves grossly intact. Skin: No rashes or petechiae noted. Psych: Normal affect.  LAB RESULTS:  Lab Results  Component Value Date   NA 136 08/14/2020   K 3.3 (L) 08/14/2020   CL 104 08/14/2020   CO2 26 08/14/2020   GLUCOSE 80 08/14/2020   BUN 18 08/14/2020   CREATININE 1.07 08/14/2020   CALCIUM 7.7 (L) 08/14/2020   PROT 6.4 (L) 08/12/2020   ALBUMIN 1.7 (L) 08/12/2020   AST 31 08/12/2020   ALT 20 08/12/2020   ALKPHOS 64 08/12/2020   BILITOT 0.5 08/12/2020   GFRNONAA >60 08/14/2020   GFRAA 64 07/25/2020    Lab Results  Component Value Date   WBC 13.1 (H) 08/16/2020   NEUTROABS 11.5 (H) 08/16/2020   HGB 9.8 (L) 08/16/2020   HCT 32.1 (L) 08/16/2020   MCV 88.2 08/16/2020   PLT 254 08/16/2020     STUDIES: DG Chest 2 View  Result Date: 07/26/2020 CLINICAL DATA:  Multifocal pneumonia, productive cough.  EXAM: CHEST - 2 VIEW COMPARISON:  July 10, 2020. FINDINGS: The heart size and mediastinal contours are within normal limits. No pneumothorax is noted. Right subclavian Port-A-Cath is unchanged in position. Stable multiple opacities are noted throughout both lungs consistent with multifocal pneumonia. Small right pleural effusion is noted. The visualized skeletal structures  are unremarkable. IMPRESSION: Stable bilateral multifocal pneumonia. Electronically Signed   By: Marijo Conception M.D.   On: 07/26/2020 08:59   CT ANGIO CHEST PE W OR WO CONTRAST  Result Date: 08/13/2020 CLINICAL DATA:  Shortness of breath. Previous surgery for esophageal carcinoma EXAM: CT ANGIOGRAPHY CHEST WITH CONTRAST TECHNIQUE: Multidetector CT imaging of the chest was performed using the standard protocol during bolus administration of intravenous contrast. Multiplanar CT image reconstructions and MIPs were obtained to evaluate the vascular anatomy. CONTRAST:  32mL OMNIPAQUE IOHEXOL 350 MG/ML SOLN COMPARISON:  August 09, 2020 chest CT FINDINGS: Cardiovascular: There is no demonstrable pulmonary embolus. There is no thoracic aortic aneurysm or dissection. Minimal calcification noted at the origin of the left common carotid artery. No other great vessel calcification noted. There is aortic atherosclerosis. There are foci of coronary artery calcification. There is no appreciable pericardial effusion or pericardial thickening. Port-A-Cath tip in superior vena cava. Mediastinum/Nodes: Thyroid appears unremarkable. There is no appreciable thoracic adenopathy by size criteria. Occasional subcentimeter mediastinal lymph nodes do not meet size criteria for pathologic significance. Patient is undergone previous gastric swing through procedure with stomach above the diaphragm. Moderate food material noted within the stomach. Lungs/Pleura: Moderate free-flowing pleural effusion on the left noted. Minimal pleural effusion on the right. There is  airspace consolidation throughout portions of the right middle and lower lobes as well as in portions of the right upper lobe. There has been progression of infiltrate in the right upper lobe compared to 4 days prior. Multiple patchy areas of opacity noted throughout the left lung with consolidation in portions of the left lower lobe, stable. Upper Abdomen: Visualized upper abdominal structures appear unremarkable. Musculoskeletal: No blastic or lytic bone lesions. There are foci of degenerative change in the thoracic spine. No chest wall lesions beyond port present anteriorly on the right. Review of the MIP images confirms the above findings. IMPRESSION: 1. No demonstrable pulmonary embolus. No thoracic aortic aneurysm or dissection. Foci of atherosclerosis in the aorta and foci of coronary artery calcification noted. 2. Status post gastric swing through procedure with stomach above the diaphragm. 3. Moderate left pleural effusion with minimal right pleural effusion. Multifocal areas of pneumonia with overall progression in the right upper lobe compared to recent study. Changes similar elsewhere. Given somewhat patchy airspace opacity in portions of the upper lobes, check of COVID-19 status advised. Most of the pneumonia shows consolidation is felt to likely have bacterial etiology. Note that a degree of underlying metastatic disease could present with opacity similar to those seen in the upper lobe regions. 4.  No adenopathy appreciable. Aortic Atherosclerosis (ICD10-I70.0). Electronically Signed   By: Lowella Grip III M.D.   On: 08/13/2020 11:30   CT CHEST ABDOMEN PELVIS W CONTRAST  Result Date: 08/09/2020 CLINICAL DATA:  Nausea and vomiting in a patient with known esophageal cancer. EXAM: CT CHEST, ABDOMEN, AND PELVIS WITH CONTRAST TECHNIQUE: Multidetector CT imaging of the chest, abdomen and pelvis was performed following the standard protocol during bolus administration of intravenous contrast.  CONTRAST:  58mL OMNIPAQUE IOHEXOL 300 MG/ML  SOLN COMPARISON:  Multiple prior studies including CT of the abdomen and pelvis from April 22, 2019 FINDINGS: CT CHEST FINDINGS Cardiovascular: Three-vessel coronary artery disease. Heart size is stable. Aortic caliber is stable at approximately 3.6 cm. RIGHT-sided Port-A-Cath terminates in the mid superior vena cava as on the recent chest CT from 05/21/2020. Central pulmonary vasculature is unremarkable on venous phase assessment. Mediastinum/Nodes: Post esophagectomy with gastric pull-through. Patulous  appearance of the pull-through which is filled partially with oral contrast media. No adenopathy in the chest. Lungs/Pleura: Numerous areas of airspace disease with nodular features and patchy areas of ground-glass. This is worse than on the study of May 21, 2020 now with involvement in the LEFT chest as well as the RIGHT and with more extensive involvement in the RIGHT chest when compared to the prior study. There is also slight interval enlargement of a very small RIGHT pleural effusion and interval development of a LEFT pleural effusion since the prior study. Musculoskeletal: See below for full musculoskeletal details. CT ABDOMEN PELVIS FINDINGS Hepatobiliary: No focal, suspicious hepatic lesion. The portal vein is patent. Gallbladder is decompressed. No biliary duct dilation. Pancreas: Soft tissue along the posterior pancreas may have diminished in size but obliterates the normal fat about the celiac axis similar to distribution seen on prior study but again perhaps diminished in size. No intrapancreatic lesion or signs of inflammation. Spleen: Normal Adrenals/Urinary Tract: Normal adrenal glands. Symmetric renal enhancement. No hydronephrosis. The LEFT renal pelvic calculus has migrated slightly further into the renal pelvis measuring approximately 8 mm. Increasing stone burden in the LEFT kidney since previous imaging with another calculus in the interpolar  calyx and infundibulum approximately 5 mm. Distal ureters are difficult to assess. Urinary bladder is under distended. Stomach/Bowel: Soft tissue about the hepatic gastric recess measuring approximately 3.4 x 2.1 cm in axial dimension. Less bulky appearance along the LEFT periaortic region where it previously measured approximately 2.3 cm in greatest thickness and now measures 1.3 cm. Greatest maximal dimension of soft tissue on the prior study was approximately 3.9 by 2.6 cm. No sign of small bowel obstruction. The ingested contrast material passes through the small bowel with early opacification of the cecum which is present in the pelvis. Stool filled colon with signs of colonic diverticulosis. Ascending colonic thickening colonic thickening. Vascular/Lymphatic: Calcified atheromatous plaque in the abdominal aorta. Aneurysmal dilation of the infrarenal abdominal aorta approximately 2.9 cm greatest caliber similar to the prior study accounting for tortuosity in the axial plane. Reproductive: Prostate heterogeneous on CT with nonspecific appearance. Other: Diffuse loss of subcutaneous fat and mesenteric fat since the prior imaging evaluation makes assessment more difficult. Appendix not visualized without overt signs of acute appendicitis. Musculoskeletal: No acute musculoskeletal process. Spinal degenerative changes. IMPRESSION: 1. Findings of bilateral pneumonia with worsening compared to previous imaging. The possibility of background pulmonary metastatic disease is also considered based on previous PET imaging. There is rapid worsening in some areas, particularly the RIGHT upper lobe and LEFT lung base when compared to the previous more recent PET scan which would support a worsening overlay of infection perhaps from aspiration. 2. Ascending colonic thickening may be related to colitis, correlate with any risk factors for infectious colitis in the setting of recent antibiotic administration. 3. Post  esophagectomy with gastric pull-through. Patulous appearance of the pull-through which is filled partially with oral contrast media further supporting the possibility of aspiration in the setting of repeated nausea and vomiting. 4. Decreased soft tissue is suggested in the upper abdomen with limited assessment due to cachexia and early venous phase on the current study. 5. filling of the cecum with ingested contrast material, cecum now in the pelvis. Appendix not visualized. No secondary signs to suggest acute appendicitis. Assessment is however limited due to lack of intra-abdominal fat. If there is continued pain or symptoms that localized to the pelvis, repeat scanning could be considered after passage of oral contrast further  into the colon. 6. Increased stone burden in the LEFT kidney. 7. Aneurysmal dilation of the infrarenal abdominal aorta approximately 2.9 cm greatest caliber similar to the prior study accounting for tortuosity in the axial plane. 8. Cachexia. Aortic Atherosclerosis (ICD10-I70.0). Electronically Signed   By: Zetta Bills M.D.   On: 08/09/2020 19:09   NM PET Image Initial (PI) Skull Base To Thigh  Result Date: 07/27/2020 CLINICAL DATA:  Subsequent treatment strategy for esophageal cancer. EXAM: NUCLEAR MEDICINE PET SKULL BASE TO THIGH TECHNIQUE: 6.2 mCi F-18 FDG was injected intravenously. Full-ring PET imaging was performed from the skull base to thigh after the radiotracer. CT data was obtained and used for attenuation correction and anatomic localization. Fasting blood glucose: 75 mg/dl COMPARISON:  CT 05/21/2020, PET-CT 04/27/2020, PET-CT 12/07/2019 FINDINGS: Mediastinal blood pool activity: SUV max 1.6 Liver activity: SUV max 2.2 NECK: No hypermetabolic lymph nodes in the neck. Incidental CT findings: none CHEST: Progressive bilateral nodular foci with interstitial thickening within the LEFT and RIGHT lower lobe and progressing to the RIGHT upper lobe and inferior lingula over  comparison exams from April 27, 2020 and May 21, 2020. These nodules reach consolidation within the RIGHT lower lobe. These nodules have associated metabolic activity. For example cluster of nodules in the RIGHT upper lobe with SUV max equal 4.2. LEFT lingular nodule thickening with SUV max equal 2.9. In the medial RIGHT lower lobe nodular focus with SUV max equal 5.5. Mild hypermetabolic lower paratracheal nodes. For example LEFT lower paratracheal node SUV max equal 3.2. Incidental CT findings: A gastric pull-up anatomy. The stomach is fluid-filled. ABDOMEN/PELVIS: Focus of metabolic activity midline in the upper abdomen there is poorly defined on the noncontrast CT with SUV max equal 3.7. No abnormal activity liver. No hypermetabolic abdominopelvic lymph nodes. Incidental CT findings: Little intra-abdominal fat makes evaluation difficult. SKELETON: No focal hypermetabolic activity to suggest skeletal metastasis. Incidental CT findings: none IMPRESSION: 1. Bilateral progressive interstitial thickening, pulmonary nodules and coalescence of nodularity with associated hypermetabolic activity. Differential includes chronic progressive pulmonary infection versus malignancy including lymphangitic spread of carcinoma. Consider bronchoscopy and tissue sampling. 2. Mild mediastinal adenopathy with differential include reactive versus metastatic adenopathy. 3. Large volume of fluid within the gastric pull up. This could indicate risk for aspiration pneumonitis providing source of infection for the pulmonary findings. 4. No clear evidence of metastatic disease in the abdomen pelvis. Electronically Signed   By: Suzy Bouchard M.D.   On: 07/27/2020 15:14   DG Abdomen Acute W/Chest  Result Date: 08/09/2020 CLINICAL DATA:  Vomiting for 10 days EXAM: DG ABDOMEN ACUTE WITH 1 VIEW CHEST COMPARISON:  04/01/2019, 04/22/2019 FINDINGS: There is no evidence of dilated bowel loops or free intraperitoneal air. 8 mm rounded  calcification projects in the region of the left renal shadow. Tortuous abdominal aorta with atherosclerotic calcifications. Stable right-sided chest port. Cardiomediastinal contours are within normal limits. Worsening bilateral airspace opacities, most confluent within the right upper lobe and left lower lobe. No large pleural fluid collection. No pneumothorax. IMPRESSION: 1. Worsening multifocal pneumonia. 2. Nonobstructive bowel gas pattern. 3. 8 mm calcification projects in the region of the left renal shadow. Electronically Signed   By: Davina Poke D.O.   On: 08/09/2020 16:28    ASSESSMENT: Recurrent adenocarcinoma of the lower third of the esophagus.  PLAN:    1.  Recurrent adenocarcinoma of the lower third of the esophagus: Patient initially completed 6 cycles of weekly carboplatinum and Taxol on August 21, 2016 and daily XRT  completed on August 27, 2016. Patient had his esophagectomy on October 07, 2016.  Patient was then noted to have recurrence and subsequently underwent 12 cycles of FOLFOX completing on March 31, 2020.  PET scan results from July 27, 2020 reviewed independently and reported as above highly suspicious for recurrent disease.  Despite this, EGD at [facility] on August 03, 2020 did not reveal any malignancy but patient noted to have delayed gastric emptying and gastric outlet obstruction.  Recommendation was consideration of gastric emptying study and potentially a gastrojejunostomy.  Given patient's declining performance status, will hold off on any further procedures as patient has elected to enroll in hospice.  Return to clinic in 6 weeks with repeat laboratory work and further evaluation. 2.  Bilateral pulmonary nodules: May be infectious secondary to persistent aspiration.  Continue current antibiotics. 3.  Diarrhea: Patient does not complain of this today. 4.  Renal insufficiency: Resolved. 5.  Anemia: Hemoglobin improved to 9.8, although there may be a  component of hemoconcentration.  Monitor. 6.  Leukocytosis: White blood cell count elevated at 30.1.  Continue current antibiotics.  Patient expressed understanding and was in agreement with this plan. He also understands that He can call clinic at any time with any questions, concerns, or complaints.    [name], MD 08/17/20 6:38 AM

## 2020-08-09 NOTE — ED Provider Notes (Signed)
[facility] Emergency Department Provider Note   ____________________________________________   First MD Initiated Contact with Patient 08/09/20 1537     (approximate)  I have reviewed the triage vital signs and the nursing notes.   HISTORY  Chief Complaint Emesis    HPI Corey Perry is a 74 y.o. male patient with esophageal cancer.  He had a his esophageal dilated recently.  He also says he was diagnosed with pneumonia and had some antibiotics given to him after that for the last 10 days he has been vomiting and cannot keep almost anything down.  Sometimes food seems to get stuck and he vomits but other times he just vomits without the food getting stuck.  He is not having any pain anywhere.  He is losing weight.        Past Medical History:  Diagnosis Date  . Abnormal white blood cell (WBC) count    seeing Dr. [name] at [place] on 10/25  . Anemia   . Atrial fibrillation ([place])   . BPH (benign prostatic hypertrophy)   . Bradycardia   . CAD (coronary artery disease)   . Dysrhythmia    bradycardia - sees Dr. Humphrey Rolls  . Esophageal cancer ([place]) 05/2016  . GERD (gastroesophageal reflux disease)   . History of kidney stones   . Hyperlipidemia   . Hypertension   . Hypothyroidism   . Kidney stone   . Myocardial infarction ([place]) 2005  . Pneumonia   . Pneumonia 06/22/2020  . S/P angioplasty with stent 2005   x 4 vessels  . Sleep apnea    mild-NO CPAP  . Spinal stenosis    lumbar    Patient Active Problem List   Diagnosis Date Noted  . Hypoxia 07/18/2020  . Severe sepsis with septic shock ([place]) 05/24/2020  . Septic shock ([place]) 05/24/2020  . Malnutrition of moderate degree 05/23/2020  . Aspiration pneumonia of right middle lobe ([place]) 05/22/2020  . Sepsis ([place]) 05/22/2020  . Hypotension 05/22/2020  . CKD (chronic kidney disease) stage 3, GFR 30-59 ml/min (HCC) 05/22/2020  . Acute respiratory failure with hypoxia ([place]) 05/22/2020  .  Pneumonia of right middle lobe due to infectious organism 04/25/2020  . Cough 04/20/2020  . Diarrhea 11/10/2019  . Atrial fibrillation ([place])   . Senile purpura ([place]) 08/27/2018  . Advanced care planning/counseling discussion 07/29/2017  . Recurrent inguinal hernia of right side without obstruction or gangrene 07/29/2017  . Kidney stones 02/02/2017  . Esophageal cancer ([place]) 06/23/2016  . Leukopenia 04/10/2016  . Thrombocytopenia ([place]) 04/10/2016  . Hypertensive kidney disease with CKD stage III ([place]) 06/22/2015  . CAD (coronary artery disease) 06/21/2015  . BPH (benign prostatic hyperplasia) 06/21/2015  . Acid reflux 06/21/2015  . Hypothyroid   . Hyperlipidemia     Past Surgical History:  Procedure Laterality Date  . BACK SURGERY    . CARDIAC CATHETERIZATION Left 03/12/2016   Procedure: Left Heart Cath and Coronary Angiography;  Surgeon: [name], MD;  Location: [place] [facility];  Service: Cardiovascular;  Laterality: Left;  . CARDIAC CATHETERIZATION N/A 03/12/2016   Procedure: Coronary Stent Intervention;  Surgeon: Yolonda Kida, MD;  Location: [place] [facility];  Service: Cardiovascular;  Laterality: N/A;  . COLONOSCOPY WITH PROPOFOL N/A 07/13/2015   Procedure: COLONOSCOPY WITH PROPOFOL;  Surgeon: Lucilla Lame, MD;  Location: [place];  Service: Endoscopy;  Laterality: N/A;  . CORONARY ANGIOPLASTY  10/05 and 12/05   4 DES placed  .  CYSTOSCOPY W/ RETROGRADES Left 06/15/2019   Procedure: CYSTOSCOPY WITH RETROGRADE PYELOGRAM;  Surgeon: Abbie Sons, MD;  Location: [facility];  Service: Urology;  Laterality: Left;  . CYSTOSCOPY/URETEROSCOPY/HOLMIUM LASER/STENT PLACEMENT Left 06/15/2019   Procedure: CYSTOSCOPY/URETEROSCOPY/HOLMIUM LASER/STENT PLACEMENT;  Surgeon: Abbie Sons, MD;  Location: [facility];  Service: Urology;  Laterality: Left;  . ESOPHAGECTOMY  10/07/2016   @ [facility]  . ESOPHAGOGASTRODUODENOSCOPY (EGD) WITH PROPOFOL N/A 06/14/2016   Procedure:  ESOPHAGOGASTRODUODENOSCOPY (EGD) WITH PROPOFOL;  Surgeon: [name], MD;  Location: [facility];  Service: Endoscopy;  Laterality: N/A;  . HERNIA REPAIR Bilateral    x3  . INGUINAL HERNIA REPAIR Right 08/28/2017   Large PerFix plug, recurrent hernia;  Surgeon: Robert Bellow, MD;  Location: [facility];  Service: General;  Laterality: Right;  recurrent  . KIDNEY STONE SURGERY  07/15/2019  . POLYPECTOMY  07/13/2015   Procedure: POLYPECTOMY;  Surgeon: Lucilla Lame, MD;  Location: [place];  Service: Endoscopy;;  . PORT-A-CATH REMOVAL N/A 01/16/2017   Procedure: REMOVAL PORT-A-CATH;  Surgeon: Olean Ree, MD;  Location: [facility];  Service: General;  Laterality: N/A;  . PORTACATH PLACEMENT N/A 07/08/2016   Procedure: INSERTION PORT-A-CATH;  Surgeon: Olean Ree, MD;  Location: [facility];  Service: General;  Laterality: N/A;  . PORTACATH PLACEMENT Right 08/30/2019   Procedure: INSERTION PORT-A-CATH;  Surgeon: Olean Ree, MD;  Location: [facility];  Service: General;  Laterality: Right;    Prior to Admission medications   Medication Sig Start Date End Date Taking? Authorizing Provider  acetaminophen (TYLENOL) 500 MG tablet Take 500 mg by mouth every 6 (six) hours as needed for mild pain or moderate pain.    [provider]  amoxicillin-clavulanate (AUGMENTIN) 875-125 MG tablet Take 1 tablet by mouth 2 (two) times daily. 07/25/20   Tsosie Billing, MD  aspirin 81 MG tablet Take 81 mg by mouth daily.     [provider]  atorvastatin (LIPITOR) 40 MG tablet Take 1 tablet (40 mg total) by mouth daily. 05/01/20   Park Liter P, DO  clopidogrel (PLAVIX) 75 MG tablet Take 1 tablet (75 mg total) by mouth daily. 05/01/20   Johnson, Megan P, DO  diphenoxylate-atropine (LOMOTIL) 2.5-0.025 MG tablet Take 2 tablets by mouth 4 (four) times daily as needed for diarrhea or loose stools. 03/08/20   [name], MD  feeding supplement, ENSURE ENLIVE, (ENSURE  ENLIVE) LIQD Take 237 mLs by mouth 2 (two) times daily between meals. 05/24/20   Raiford Noble Latif, DO  levothyroxine (SYNTHROID) 50 MCG tablet Take 1 tablet (50 mcg total) by mouth daily. 02/15/20   Johnson, Megan P, DO  loperamide (IMODIUM A-D) 2 MG tablet Take 2 mg by mouth 4 (four) times daily as needed for diarrhea or loose stools.     [provider]  metoprolol succinate (TOPROL-XL) 25 MG 24 hr tablet Take 0.5 tablets (12.5 mg total) by mouth daily. 05/24/20   Raiford Noble Latif, DO  multivitamin-iron-minerals-folic acid (CENTRUM) chewable tablet Chew 1 tablet by mouth daily.    [provider]  nitroGLYCERIN (NITROSTAT) 0.4 MG SL tablet Place 1 tablet under the tongue every 5 (five) minutes as needed for chest pain.  03/08/16   [provider]  ondansetron (ZOFRAN) 8 MG tablet Take 1 tablet (8 mg total) by mouth every 8 (eight) hours as needed for nausea. 07/31/20   Borders, Kirt Boys, NP  pantoprazole (PROTONIX) 20 MG tablet Take 20 mg by mouth daily.  [provider]  Probiotic Product ([place]) CAPS Take 1 capsule by mouth in the morning.    [provider]  prochlorperazine (COMPAZINE) 10 MG tablet Take 1 tablet (10 mg total) by mouth every 6 (six) hours as needed (Nausea or vomiting). 08/26/19   [name], MD    Allergies Patient has no known allergies.  Family History  Problem Relation Age of Onset  . Hypertension Mother   . Dementia Mother   . Heart disease Father   . Hypertension Father   . Kidney disease Father   . Hypertension Brother   . Heart disease Brother   . Diabetes Son   . Hypertension Son   . Hyperlipidemia Son   . Hyperlipidemia Daughter   . Hypertension Daughter     Social History Social History   Tobacco Use  . Smoking status: Former Smoker    Packs/day: 1.00    Years: 40.00    Pack years: 40.00    Types: Cigarettes    Quit date: 07/16/2004    Years since quitting: 16.0  .  Smokeless tobacco: Former Systems developer    Types: Secondary school teacher  . Vaping Use: Never used  Substance Use Topics  . Alcohol use: Yes    Alcohol/week: 1.0 standard drink    Types: 1 Cans of beer per week  . Drug use: No    Review of Systems  Constitutional: No fever/chills Eyes: No visual changes. ENT: No sore throat. Cardiovascular: Denies chest pain. Respiratory: Denies shortness of breath. Gastrointestinal: No abdominal pain.   nausea,  vomiting.  No diarrhea.  No constipation. Genitourinary: Negative for dysuria. Musculoskeletal: Negative for back pain. Skin: Negative for rash. Neurological: Negative for headaches, focal weakness   ____________________________________________   PHYSICAL EXAM:  VITAL SIGNS: ED Triage Vitals [08/09/20 1338]  Enc Vitals Group     BP (!) 99/58     Pulse Rate 72     Resp 18     Temp 97.6 F (36.4 C)     Temp Source Oral     SpO2 97 %     Weight 118 lb (53.5 kg)     Height 5\' 9"  (1.753 m)     Head Circumference      Peak Flow      Pain Score 0     Pain Loc      Pain Edu?      Excl. in [place]?     Constitutional: Alert and oriented.  Looks comfortable but is cachectic Eyes: Conjunctivae are normal. P Head: Atraumatic. Nose: No congestion/rhinnorhea. Mouth/Throat: Mucous membranes are moist.  Oropharynx non-erythematous. Neck: No stridor. Cardiovascular: Normal rate, regular rhythm. Grossly normal heart sounds.  Good peripheral circulation. Respiratory: Normal respiratory effort.  No retractions. Lungs CTAB. Gastrointestinal: Soft and nontender. No distention. No abdominal bruits.  Musculoskeletal: No lower extremity tenderness nor edema.   Neurologic:  Normal speech and language. No gross focal neurologic deficits are appreciated. . Skin:  Skin is warm, dry and intact. No rash noted.  ____________________________________________   LABS (all labs ordered are listed, but only abnormal results are displayed)  Labs Reviewed    COMPREHENSIVE METABOLIC PANEL - Abnormal; Notable for the following components:      Result Value   Chloride 97 (*)    Glucose, Bld 134 (*)    BUN 34 (*)    Creatinine, Ser 1.46 (*)    Calcium 8.7 (*)    Albumin 2.2 (*)  GFR, Estimated 50 (*)    All other components within normal limits  CBC - Abnormal; Notable for the following components:   WBC 12.1 (*)    RBC 3.46 (*)    Hemoglobin 9.4 (*)    HCT 31.5 (*)    MCHC 29.8 (*)    RDW 18.9 (*)    All other components within normal limits  RESP PANEL BY RT-PCR (FLU A&B, COVID) ARPGX2  LIPASE, BLOOD  PROCALCITONIN  URINALYSIS, COMPLETE (UACMP) WITH MICROSCOPIC   ____________________________________________  EKG   ____________________________________________  RADIOLOGY Gertha Calkin, personally viewed and evaluated these images (plain radiographs) as part of my medical decision making, as well as reviewing the written report by the radiologist.  ED MD interpretation acute abdominal series read by radiology reviewed by me shows worsening multifocal pneumonia with some small air-fluid levels in the upright abdominal film under the stomach. CT read by radiology reviewed by me shows worsening of the bilateral pneumonia which is probably due to aspiration from his repeated vomiting but could also be spread of the cancer.  Patient has no abdominal pain so colitis and other inflammatory problems in the abdomen are less likely.   Official radiology report(s): CT CHEST ABDOMEN PELVIS W CONTRAST  Result Date: 08/09/2020 CLINICAL DATA:  Nausea and vomiting in a patient with known esophageal cancer. EXAM: CT CHEST, ABDOMEN, AND PELVIS WITH CONTRAST TECHNIQUE: Multidetector CT imaging of the chest, abdomen and pelvis was performed following the standard protocol during bolus administration of intravenous contrast. CONTRAST:  53mL OMNIPAQUE IOHEXOL 300 MG/ML  SOLN COMPARISON:  Multiple prior studies including CT of the abdomen and pelvis  from April 22, 2019 FINDINGS: CT CHEST FINDINGS Cardiovascular: Three-vessel coronary artery disease. Heart size is stable. Aortic caliber is stable at approximately 3.6 cm. RIGHT-sided Port-A-Cath terminates in the mid superior vena cava as on the recent chest CT from 05/21/2020. Central pulmonary vasculature is unremarkable on venous phase assessment. Mediastinum/Nodes: Post esophagectomy with gastric pull-through. Patulous appearance of the pull-through which is filled partially with oral contrast media. No adenopathy in the chest. Lungs/Pleura: Numerous areas of airspace disease with nodular features and patchy areas of ground-glass. This is worse than on the study of May 21, 2020 now with involvement in the LEFT chest as well as the RIGHT and with more extensive involvement in the RIGHT chest when compared to the prior study. There is also slight interval enlargement of a very small RIGHT pleural effusion and interval development of a LEFT pleural effusion since the prior study. Musculoskeletal: See below for full musculoskeletal details. CT ABDOMEN PELVIS FINDINGS Hepatobiliary: No focal, suspicious hepatic lesion. The portal vein is patent. Gallbladder is decompressed. No biliary duct dilation. Pancreas: Soft tissue along the posterior pancreas may have diminished in size but obliterates the normal fat about the celiac axis similar to distribution seen on prior study but again perhaps diminished in size. No intrapancreatic lesion or signs of inflammation. Spleen: Normal Adrenals/Urinary Tract: Normal adrenal glands. Symmetric renal enhancement. No hydronephrosis. The LEFT renal pelvic calculus has migrated slightly further into the renal pelvis measuring approximately 8 mm. Increasing stone burden in the LEFT kidney since previous imaging with another calculus in the interpolar calyx and infundibulum approximately 5 mm. Distal ureters are difficult to assess. Urinary bladder is under distended.  Stomach/Bowel: Soft tissue about the hepatic gastric recess measuring approximately 3.4 x 2.1 cm in axial dimension. Less bulky appearance along the LEFT periaortic region where it previously measured approximately  2.3 cm in greatest thickness and now measures 1.3 cm. Greatest maximal dimension of soft tissue on the prior study was approximately 3.9 by 2.6 cm. No sign of small bowel obstruction. The ingested contrast material passes through the small bowel with early opacification of the cecum which is present in the pelvis. Stool filled colon with signs of colonic diverticulosis. Ascending colonic thickening colonic thickening. Vascular/Lymphatic: Calcified atheromatous plaque in the abdominal aorta. Aneurysmal dilation of the infrarenal abdominal aorta approximately 2.9 cm greatest caliber similar to the prior study accounting for tortuosity in the axial plane. Reproductive: Prostate heterogeneous on CT with nonspecific appearance. Other: Diffuse loss of subcutaneous fat and mesenteric fat since the prior imaging evaluation makes assessment more difficult. Appendix not visualized without overt signs of acute appendicitis. Musculoskeletal: No acute musculoskeletal process. Spinal degenerative changes. IMPRESSION: 1. Findings of bilateral pneumonia with worsening compared to previous imaging. The possibility of background pulmonary metastatic disease is also considered based on previous PET imaging. There is rapid worsening in some areas, particularly the RIGHT upper lobe and LEFT lung base when compared to the previous more recent PET scan which would support a worsening overlay of infection perhaps from aspiration. 2. Ascending colonic thickening may be related to colitis, correlate with any risk factors for infectious colitis in the setting of recent antibiotic administration. 3. Post esophagectomy with gastric pull-through. Patulous appearance of the pull-through which is filled partially with oral contrast  media further supporting the possibility of aspiration in the setting of repeated nausea and vomiting. 4. Decreased soft tissue is suggested in the upper abdomen with limited assessment due to cachexia and early venous phase on the current study. 5. filling of the cecum with ingested contrast material, cecum now in the pelvis. Appendix not visualized. No secondary signs to suggest acute appendicitis. Assessment is however limited due to lack of intra-abdominal fat. If there is continued pain or symptoms that localized to the pelvis, repeat scanning could be considered after passage of oral contrast further into the colon. 6. Increased stone burden in the LEFT kidney. 7. Aneurysmal dilation of the infrarenal abdominal aorta approximately 2.9 cm greatest caliber similar to the prior study accounting for tortuosity in the axial plane. 8. Cachexia. Aortic Atherosclerosis (ICD10-I70.0). Electronically Signed   By: Zetta Bills M.D.   On: 08/09/2020 19:09   DG Abdomen Acute W/Chest  Result Date: 08/09/2020 CLINICAL DATA:  Vomiting for 10 days EXAM: DG ABDOMEN ACUTE WITH 1 VIEW CHEST COMPARISON:  04/01/2019, 04/22/2019 FINDINGS: There is no evidence of dilated bowel loops or free intraperitoneal air. 8 mm rounded calcification projects in the region of the left renal shadow. Tortuous abdominal aorta with atherosclerotic calcifications. Stable right-sided chest port. Cardiomediastinal contours are within normal limits. Worsening bilateral airspace opacities, most confluent within the right upper lobe and left lower lobe. No large pleural fluid collection. No pneumothorax. IMPRESSION: 1. Worsening multifocal pneumonia. 2. Nonobstructive bowel gas pattern. 3. 8 mm calcification projects in the region of the left renal shadow. Electronically Signed   By: Davina Poke D.O.   On: 08/09/2020 16:28    ____________________________________________   PROCEDURES  Procedure(s) performed (including Critical  Care):  Procedures   ____________________________________________   INITIAL IMPRESSION / ASSESSMENT AND PLAN / ED COURSE  Patient vomits after the Compazine.  We will try some Zofran and see if he can tolerate p.o.  If not we will need to get him in the hospital. Patient vomits again after Zofran.  Discussed the patient with Dr.  [place] oncology.  With the worsening lung findings and repeated vomiting it is likely best to get him in the hospital treat him with some antibiotics for possible aspiration pneumonia and then get a swallowing study and further work-up with GI.  Patient is unable to tolerate enough p.o. food to maintain his weight.             ____________________________________________   FINAL CLINICAL IMPRESSION(S) / ED DIAGNOSES  Final diagnoses:  Aspiration pneumonia of both lungs, unspecified aspiration pneumonia type, unspecified part of lung ([place])  Dehydration  Persistent recurrent vomiting  Weight loss, non-intentional     ED Discharge Orders    None      *Please note:  JAHZEEL POYTHRESS was evaluated in Emergency Department on 08/09/2020 for the symptoms described in the history of present illness. He was evaluated in the context of the global COVID-19 pandemic, which necessitated consideration that the patient might be at risk for infection with the SARS-CoV-2 virus that causes COVID-19. Institutional protocols and algorithms that pertain to the evaluation of patients at risk for COVID-19 are in a state of rapid change based on information released by regulatory bodies including the CDC and federal and state organizations. These policies and algorithms were followed during the patient's care in the ED.  Some ED evaluations and interventions may be delayed as a result of limited staffing during and the pandemic.*   Note:  This document was prepared using Dragon voice recognition software and may include unintentional dictation errors.    Nena Polio, MD 08/09/20 [phone]

## 2020-08-10 DIAGNOSIS — E860 Dehydration: Secondary | ICD-10-CM | POA: Diagnosis not present

## 2020-08-10 DIAGNOSIS — R112 Nausea with vomiting, unspecified: Secondary | ICD-10-CM | POA: Diagnosis not present

## 2020-08-10 LAB — LACTIC ACID, PLASMA
Lactic Acid, Venous: 1.8 mmol/L (ref 0.5–1.9)
Lactic Acid, Venous: 1.9 mmol/L (ref 0.5–1.9)

## 2020-08-10 LAB — CBC
HCT: 28 % — ABNORMAL LOW (ref 39.0–52.0)
Hemoglobin: 8.5 g/dL — ABNORMAL LOW (ref 13.0–17.0)
MCH: 27.4 pg (ref 26.0–34.0)
MCHC: 30.4 g/dL (ref 30.0–36.0)
MCV: 90.3 fL (ref 80.0–100.0)
Platelets: 136 10*3/uL — ABNORMAL LOW (ref 150–400)
RBC: 3.1 MIL/uL — ABNORMAL LOW (ref 4.22–5.81)
RDW: 18.8 % — ABNORMAL HIGH (ref 11.5–15.5)
WBC: 7.4 10*3/uL (ref 4.0–10.5)
nRBC: 0 % (ref 0.0–0.2)

## 2020-08-10 LAB — BASIC METABOLIC PANEL
Anion gap: 8 (ref 5–15)
BUN: 32 mg/dL — ABNORMAL HIGH (ref 8–23)
CO2: 29 mmol/L (ref 22–32)
Calcium: 8.2 mg/dL — ABNORMAL LOW (ref 8.9–10.3)
Chloride: 97 mmol/L — ABNORMAL LOW (ref 98–111)
Creatinine, Ser: 1.3 mg/dL — ABNORMAL HIGH (ref 0.61–1.24)
GFR, Estimated: 58 mL/min — ABNORMAL LOW (ref >60)
Glucose, Bld: 75 mg/dL (ref 70–99)
Potassium: 4.1 mmol/L (ref 3.5–5.1)
Sodium: 134 mmol/L — ABNORMAL LOW (ref 135–145)

## 2020-08-10 MED ORDER — SODIUM CHLORIDE 0.9 % IV BOLUS
1000.0000 mL | Freq: Once | INTRAVENOUS | Status: AC
  Administered 2020-08-10: 13:00:00 1000 mL via INTRAVENOUS

## 2020-08-10 MED ORDER — PANTOPRAZOLE SODIUM 20 MG PO TBEC
20.0000 mg | DELAYED_RELEASE_TABLET | Freq: Every day | ORAL | Status: DC
  Administered 2020-08-10 – 2020-08-14 (×5): 20 mg via ORAL
  Filled 2020-08-10 (×5): qty 1

## 2020-08-10 MED ORDER — ACETAMINOPHEN 500 MG PO TABS: 500.0000 mg | ORAL_TABLET | Freq: Four times a day (QID) | ORAL | Status: DC | PRN

## 2020-08-10 MED ORDER — MORPHINE SULFATE (PF) 2 MG/ML IV SOLN
2.0000 mg | INTRAVENOUS | Status: DC | PRN
  Administered 2020-08-10 – 2020-08-13 (×6): 2 mg via INTRAVENOUS
  Filled 2020-08-10 (×7): qty 1

## 2020-08-10 MED ORDER — SODIUM CHLORIDE 0.9 % IV SOLN
250.0000 mg | Freq: Three times a day (TID) | INTRAVENOUS | Status: DC
  Administered 2020-08-10 – 2020-08-14 (×11): 250 mg via INTRAVENOUS
  Filled 2020-08-10 (×15): qty 5

## 2020-08-10 MED ORDER — CHLORHEXIDINE GLUCONATE CLOTH 2 % EX PADS: 6.0000 | MEDICATED_PAD | Freq: Every day | CUTANEOUS | Status: DC

## 2020-08-10 MED ORDER — SODIUM CHLORIDE 0.9 % IV SOLN
3.0000 g | Freq: Four times a day (QID) | INTRAVENOUS | Status: DC
  Administered 2020-08-10 – 2020-08-14 (×15): 3 g via INTRAVENOUS
  Filled 2020-08-10 (×2): qty 0.86
  Filled 2020-08-10: qty 3
  Filled 2020-08-10 (×2): qty 0.86
  Filled 2020-08-10: qty 8
  Filled 2020-08-10: qty 0.86
  Filled 2020-08-10 (×2): qty 3
  Filled 2020-08-10 (×2): qty 0.86
  Filled 2020-08-10: qty 8
  Filled 2020-08-10: qty 3
  Filled 2020-08-10: qty 8
  Filled 2020-08-10: qty 0.86
  Filled 2020-08-10: qty 3
  Filled 2020-08-10 (×2): qty 8

## 2020-08-10 NOTE — Progress Notes (Addendum)
PROGRESS NOTE    Corey Perry  ASN:[id number] DOB: February 17, 1946 DOA: 08/09/2020 PCP: Valerie Roys, DO   Brief Narrative:  HPI: Corey Perry is a 74 y.o. male with medical history significant for   Ate egg and sausage sandwich before coming to hospital and did not have nausea.  He states he understands that he needs to eat slowly and drink slowly.  He reports that he only vomited when asked to drink the 'medicine' in the ED.   He denies fever, headache, dizziness, new cough, chest pain, shortness of breath, abdominal pain, new back pain, dysuria, hematuria, urgency, urinary retention, increased urinary frequency, diarrhea, constipation, blood in stool, changes in strength.  He endorses nausea and vomiting.  He endorses baseline shortness of breath that is unchanged.  He reports that the symptoms are baseline for him.  He endorses that since the esophageal dilatation procedure on 08/03/2020, he does feel better than prior to the procedure.  Lives with spouse. 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 He was a former tobacco user (started smoking at age 44, quit 2005), 4 stents at [facility] in 2005 and one stent in 2017. Endorses infrequent ETOH use (1/2 glass of whiskey per week).   ED Course: Discussed with ED provider, requesting admission for intractable nausea and vomiting and GI study.   Assessment & Plan:   Active Problems:   Vomiting   Dehydration  Vomiting / dehydration/gastroparesis: Patient has a history of adenocarcinoma lower third of esophagus with history of esophagectomy on October 07, 2016.  GI was consulted.  Per GI, patient symptoms are consistent with gastroparesis.  He has been started on erythromycin lately.  No further intervention planned or recommended by GI.  We will start him on IV erythromycin for now.  Checking EKG.  Will start on Reglan if no QT prolongation.  Acute on chronic hypoxic respiratory failure secondary to  aspiration pneumonia: Per his wife, patient has been taking Augmentin for about 3 weeks.  CT chest abdomen and pelvis once again shows findings of bilateral pneumonia, worsening compared to previous imaging.  Possibility of background pulmonary metastasis is also considered based on previous PET scans.  Patient has been started on Zosyn.  We will switch him to Unasyn.  Patient is requiring 3 to 4 L of oxygen.  At home he requires 2 L.  History of CAD-, no symptoms.  Continue aspirin and Plavix  History of A. fib-continue metoprolol succinate 12.5 daily  Hyperlipidemia-continue 40 mg atorvastatin nightly  Hypothyroid-continue levothyroxine 50 mcg in the morning  Possible ascending colitis: CT abdomen pelvis indicates ascending colitis.  Per patient, he had diarrhea before but not anymore.  He had soft bowel movement today.  Checking C. difficile as he has been on antibiotics for a long time.  Contact isolation.  Hypotension: Patient's blood pressure is running low with a systolic around 19F.  According to his wife, his blood pressure always runs low however yesterday his blood pressure was running between 790-240 systolic.  Patient is asymptomatic.  Check lactic acid.  Monitor closely.  His wife wants him home today but he is not stable for discharge yet.  He also received 1 L of IV fluid bolus.    DVT prophylaxis: enoxaparin (LOVENOX) injection 40 mg Start: 08/10/20 1000 Place TED hose Start: 08/09/20 2207   Code Status: DNR  Family Communication: None present at bedside.  Plan of care discussed with patient in length and he verbalized understanding and agreed with  it.  Also discussed with his wife over the phone.  Status is: Inpatient  Remains inpatient appropriate because:Hemodynamically unstable   Dispo: The patient is from: Home              Anticipated d/c is to: Home              Anticipated d/c date is: 1 day              Patient currently is not medically stable to  d/c.        Estimated body mass index is 17.43 kg/m as calculated from the following:   Height as of this encounter: 5\' 9"  (1.753 m).   Weight as of this encounter: 53.5 kg.      Nutritional status:               Consultants:   GI  Procedures:   None  Antimicrobials:  Anti-infectives (From admission, onward)   Start     Dose/Rate Route Frequency Ordered Stop   08/09/20 2200  azithromycin (ZITHROMAX) 500 mg in sodium chloride 0.9 % 250 mL IVPB  Status:  Discontinued        500 mg 250 mL/hr over 60 Minutes Intravenous Every 24 hours 08/09/20 2050 08/09/20 2135   08/09/20 2200  cefTRIAXone (ROCEPHIN) 1 g in sodium chloride 0.9 % 100 mL IVPB  Status:  Discontinued        1 g 200 mL/hr over 30 Minutes Intravenous  Once 08/09/20 2050 08/09/20 2135   08/09/20 2200  piperacillin-tazobactam (ZOSYN) IVPB 3.375 g        3.375 g 12.5 mL/hr over 240 Minutes Intravenous Every 8 hours 08/09/20 2135           Subjective: Seen and examined.  No complaints.  Denied any nausea or shortness of breath.  Objective: Vitals:   08/10/20 0750 08/10/20 1114 08/10/20 1130 08/10/20 1443  BP: 113/75 (!) 85/51 (!) 84/42 91/61  Pulse: 78 70  72  Resp: 18 18    Temp: 98.5 F (36.9 C) 98.6 F (37 C)    TempSrc:      SpO2:  (!) 84%  (!) 83%  Weight:      Height:        Intake/Output Summary (Last 24 hours) at 08/10/2020 1454 Last data filed at 08/10/2020 0400 Gross per 24 hour  Intake 952.74 ml  Output 420 ml  Net 532.74 ml   Filed Weights   08/09/20 1338  Weight: 53.5 kg    Examination:  General exam: Appears calm and comfortable  Respiratory system: Scattered rhonchi.  Respiratory effort normal. Cardiovascular system: S1 & S2 heard, RRR. No JVD, murmurs, rubs, gallops or clicks. No pedal edema. Gastrointestinal system: Abdomen is nondistended, soft and nontender. No organomegaly or masses felt. Normal bowel sounds heard. Central nervous system: Alert and  oriented. No focal neurological deficits. Extremities: Symmetric 5 x 5 power. Skin: No rashes, lesions or ulcers Psychiatry: Judgement and insight appear poor.  Mood & affect appropriate.    Data Reviewed: I have personally reviewed following labs and imaging studies  CBC: Recent Labs  Lab 08/09/20 1341 08/10/20 0609  WBC 12.1* 7.4  HGB 9.4* 8.5*  HCT 31.5* 28.0*  MCV 91.0 90.3  PLT 180 993*   Basic Metabolic Panel: Recent Labs  Lab 08/09/20 1341 08/10/20 0609  NA 137 134*  K 4.5 4.1  CL 97* 97*  CO2 32 29  GLUCOSE 134* 75  BUN 34* 32*  CREATININE 1.46* 1.30*  CALCIUM 8.7* 8.2*   GFR: Estimated Creatinine Clearance: 38.3 mL/min (A) (by C-G formula based on SCr of 1.3 mg/dL (H)). Liver Function Tests: Recent Labs  Lab 08/09/20 1341  AST 32  ALT 25  ALKPHOS 68  BILITOT 0.7  PROT 8.1  ALBUMIN 2.2*   Recent Labs  Lab 08/09/20 1341  LIPASE 28   No results for input(s): AMMONIA in the last 168 hours. Coagulation Profile: No results for input(s): INR, PROTIME in the last 168 hours. Cardiac Enzymes: No results for input(s): CKTOTAL, CKMB, CKMBINDEX, TROPONINI in the last 168 hours. BNP (last 3 results) No results for input(s): PROBNP in the last 8760 hours. HbA1C: No results for input(s): HGBA1C in the last 72 hours. CBG: No results for input(s): GLUCAP in the last 168 hours. Lipid Profile: No results for input(s): CHOL, HDL, LDLCALC, TRIG, CHOLHDL, LDLDIRECT in the last 72 hours. Thyroid Function Tests: No results for input(s): TSH, T4TOTAL, FREET4, T3FREE, THYROIDAB in the last 72 hours. Anemia Panel: No results for input(s): VITAMINB12, FOLATE, FERRITIN, TIBC, IRON, RETICCTPCT in the last 72 hours. Sepsis Labs: Recent Labs  Lab 08/09/20 1723  PROCALCITON 0.20    Recent Results (from the past 240 hour(s))  Resp Panel by RT-PCR (Flu A&B, Covid) Nasopharyngeal Swab     Status: None   Collection Time: 08/09/20  5:23 PM   Specimen: Nasopharyngeal  Swab; Nasopharyngeal(NP) swabs in vial transport medium  Result Value Ref Range Status   SARS Coronavirus 2 by RT PCR NEGATIVE NEGATIVE Final    Comment: (NOTE) SARS-CoV-2 target nucleic acids are NOT DETECTED.  The SARS-CoV-2 RNA is generally detectable in upper respiratory specimens during the acute phase of infection. The lowest concentration of SARS-CoV-2 viral copies this assay can detect is 138 copies/mL. A negative result does not preclude SARS-Cov-2 infection and should not be used as the sole basis for treatment or other patient management decisions. A negative result may occur with  improper specimen collection/handling, submission of specimen other than nasopharyngeal swab, presence of viral mutation(s) within the areas targeted by this assay, and inadequate number of viral copies(<138 copies/mL). A negative result must be combined with clinical observations, patient history, and epidemiological information. The expected result is Negative.  Fact Sheet for Patients:  EntrepreneurPulse.com.au  Fact Sheet for Healthcare Providers:  IncredibleEmployment.be  This test is no t yet approved or cleared by the [place] FDA and  has been authorized for detection and/or diagnosis of SARS-CoV-2 by FDA under an Emergency Use Authorization (EUA). This EUA will remain  in effect (meaning this test can be used) for the duration of the COVID-19 declaration under Section 564(b)(1) of the Act, 21 U.S.C.section 360bbb-3(b)(1), unless the authorization is terminated  or revoked sooner.       Influenza A by PCR NEGATIVE NEGATIVE Final   Influenza B by PCR NEGATIVE NEGATIVE Final    Comment: (NOTE) The Xpert Xpress SARS-CoV-2/FLU/RSV plus assay is intended as an aid in the diagnosis of influenza from Nasopharyngeal swab specimens and should not be used as a sole basis for treatment. Nasal washings and aspirates are unacceptable for Xpert Xpress  SARS-CoV-2/FLU/RSV testing.  Fact Sheet for Patients: EntrepreneurPulse.com.au  Fact Sheet for Healthcare Providers: IncredibleEmployment.be  This test is not yet approved or cleared by the [place] FDA and has been authorized for detection and/or diagnosis of SARS-CoV-2 by FDA under an Emergency Use Authorization (EUA). This EUA will remain in effect (meaning this test can be used)  for the duration of the COVID-19 declaration under Section 564(b)(1) of the Act, 21 U.S.C. section 360bbb-3(b)(1), unless the authorization is terminated or revoked.  Performed at [facility], [address], [place], [place] [address]       Radiology Studies: CT CHEST ABDOMEN PELVIS W CONTRAST  Result Date: 08/09/2020 CLINICAL DATA:  Nausea and vomiting in a patient with known esophageal cancer. EXAM: CT CHEST, ABDOMEN, AND PELVIS WITH CONTRAST TECHNIQUE: Multidetector CT imaging of the chest, abdomen and pelvis was performed following the standard protocol during bolus administration of intravenous contrast. CONTRAST:  44mL OMNIPAQUE IOHEXOL 300 MG/ML  SOLN COMPARISON:  Multiple prior studies including CT of the abdomen and pelvis from April 22, 2019 FINDINGS: CT CHEST FINDINGS Cardiovascular: Three-vessel coronary artery disease. Heart size is stable. Aortic caliber is stable at approximately 3.6 cm. RIGHT-sided Port-A-Cath terminates in the mid superior vena cava as on the recent chest CT from 05/21/2020. Central pulmonary vasculature is unremarkable on venous phase assessment. Mediastinum/Nodes: Post esophagectomy with gastric pull-through. Patulous appearance of the pull-through which is filled partially with oral contrast media. No adenopathy in the chest. Lungs/Pleura: Numerous areas of airspace disease with nodular features and patchy areas of ground-glass. This is worse than on the study of May 21, 2020 now with involvement in the LEFT chest as  well as the RIGHT and with more extensive involvement in the RIGHT chest when compared to the prior study. There is also slight interval enlargement of a very small RIGHT pleural effusion and interval development of a LEFT pleural effusion since the prior study. Musculoskeletal: See below for full musculoskeletal details. CT ABDOMEN PELVIS FINDINGS Hepatobiliary: No focal, suspicious hepatic lesion. The portal vein is patent. Gallbladder is decompressed. No biliary duct dilation. Pancreas: Soft tissue along the posterior pancreas may have diminished in size but obliterates the normal fat about the celiac axis similar to distribution seen on prior study but again perhaps diminished in size. No intrapancreatic lesion or signs of inflammation. Spleen: Normal Adrenals/Urinary Tract: Normal adrenal glands. Symmetric renal enhancement. No hydronephrosis. The LEFT renal pelvic calculus has migrated slightly further into the renal pelvis measuring approximately 8 mm. Increasing stone burden in the LEFT kidney since previous imaging with another calculus in the interpolar calyx and infundibulum approximately 5 mm. Distal ureters are difficult to assess. Urinary bladder is under distended. Stomach/Bowel: Soft tissue about the hepatic gastric recess measuring approximately 3.4 x 2.1 cm in axial dimension. Less bulky appearance along the LEFT periaortic region where it previously measured approximately 2.3 cm in greatest thickness and now measures 1.3 cm. Greatest maximal dimension of soft tissue on the prior study was approximately 3.9 by 2.6 cm. No sign of small bowel obstruction. The ingested contrast material passes through the small bowel with early opacification of the cecum which is present in the pelvis. Stool filled colon with signs of colonic diverticulosis. Ascending colonic thickening colonic thickening. Vascular/Lymphatic: Calcified atheromatous plaque in the abdominal aorta. Aneurysmal dilation of the infrarenal  abdominal aorta approximately 2.9 cm greatest caliber similar to the prior study accounting for tortuosity in the axial plane. Reproductive: Prostate heterogeneous on CT with nonspecific appearance. Other: Diffuse loss of subcutaneous fat and mesenteric fat since the prior imaging evaluation makes assessment more difficult. Appendix not visualized without overt signs of acute appendicitis. Musculoskeletal: No acute musculoskeletal process. Spinal degenerative changes. IMPRESSION: 1. Findings of bilateral pneumonia with worsening compared to previous imaging. The possibility of background pulmonary metastatic disease is also considered based on  previous PET imaging. There is rapid worsening in some areas, particularly the RIGHT upper lobe and LEFT lung base when compared to the previous more recent PET scan which would support a worsening overlay of infection perhaps from aspiration. 2. Ascending colonic thickening may be related to colitis, correlate with any risk factors for infectious colitis in the setting of recent antibiotic administration. 3. Post esophagectomy with gastric pull-through. Patulous appearance of the pull-through which is filled partially with oral contrast media further supporting the possibility of aspiration in the setting of repeated nausea and vomiting. 4. Decreased soft tissue is suggested in the upper abdomen with limited assessment due to cachexia and early venous phase on the current study. 5. filling of the cecum with ingested contrast material, cecum now in the pelvis. Appendix not visualized. No secondary signs to suggest acute appendicitis. Assessment is however limited due to lack of intra-abdominal fat. If there is continued pain or symptoms that localized to the pelvis, repeat scanning could be considered after passage of oral contrast further into the colon. 6. Increased stone burden in the LEFT kidney. 7. Aneurysmal dilation of the infrarenal abdominal aorta approximately 2.9 cm  greatest caliber similar to the prior study accounting for tortuosity in the axial plane. 8. Cachexia. Aortic Atherosclerosis (ICD10-I70.0). Electronically Signed   By: Zetta Bills M.D.   On: 08/09/2020 19:09   DG Abdomen Acute W/Chest  Result Date: 08/09/2020 CLINICAL DATA:  Vomiting for 10 days EXAM: DG ABDOMEN ACUTE WITH 1 VIEW CHEST COMPARISON:  04/01/2019, 04/22/2019 FINDINGS: There is no evidence of dilated bowel loops or free intraperitoneal air. 8 mm rounded calcification projects in the region of the left renal shadow. Tortuous abdominal aorta with atherosclerotic calcifications. Stable right-sided chest port. Cardiomediastinal contours are within normal limits. Worsening bilateral airspace opacities, most confluent within the right upper lobe and left lower lobe. No large pleural fluid collection. No pneumothorax. IMPRESSION: 1. Worsening multifocal pneumonia. 2. Nonobstructive bowel gas pattern. 3. 8 mm calcification projects in the region of the left renal shadow. Electronically Signed   By: Davina Poke D.O.   On: 08/09/2020 16:28    Scheduled Meds: . aspirin EC  81 mg Oral Daily  . atorvastatin  40 mg Oral Daily  . clopidogrel  75 mg Oral Daily  . enoxaparin (LOVENOX) injection  40 mg Subcutaneous Q24H  . feeding supplement  237 mL Oral BID BM  . levothyroxine  50 mcg Oral Daily  . metoprolol succinate  12.5 mg Oral Daily  . multivitamin with minerals  1 tablet Oral Daily   Continuous Infusions: . lactated ringers 100 mL/hr at 08/10/20 0400  . piperacillin-tazobactam 3.375 g (08/10/20 0556)     LOS: 1 day   Time spent: 40 minutes   Darliss Cheney, MD [facility]  08/10/2020, 2:54 PM   To contact the attending provider between 7A-7P or the covering provider during after hours 7P-7A, please log into the web site [email or web address].

## 2020-08-10 NOTE — ED Notes (Signed)
Report given to Angela, RN.

## 2020-08-10 NOTE — Consult Note (Signed)
Pharmacy Antibiotic Note  Corey Perry is a 74 y.o. male with medical history significant for esophageal cancer, Afib, CAD admitted on 08/09/2020 with pneumonia and N/V with suspected gastroparesis and colitis. Patient initially started on Zosyn and now narrowing to Unasyn. Pharmacy has been consulted for Unasyn dosing.  Plan: Unasyn 3 g IV q6h  Height: 5\' 9"  (175.3 cm) Weight: 53.5 kg (118 lb) IBW/kg (Calculated) : 70.7  Temp (24hrs), Avg:98.5 F (36.9 C), Min:98.2 F (36.8 C), Max:98.7 F (37.1 C)  Recent Labs  Lab 08/09/20 1341 08/10/20 0609  WBC 12.1* 7.4  CREATININE 1.46* 1.30*    Estimated Creatinine Clearance: 38.3 mL/min (A) (by C-G formula based on SCr of 1.3 mg/dL (H)).    No Known Allergies  Antimicrobials this admission: Zosyn 11/24 >> 11/25 Unasyn 11/25 >>   Dose adjustments this admission: N/A  Microbiology results: C diff testing pending  Thank you for allowing pharmacy to be a part of this patient's care.  Benita Gutter 08/10/2020 3:14 PM

## 2020-08-10 NOTE — Consult Note (Signed)
Consultation  Referring Provider:   Hospitalist Admit date: 11/24 Consult date: 11/24         Reason for Consultation:     Nausea/Vomiting         HPI:   Corey Perry is a 74 y.o. gentleman with history of adenocarcinoma of the lower third of the esophagus with neoadjuvant chemotherapy with esophagectomy on October 07, 2016. Subsequently noted to have recurrence and had 12 cycles of FOLFOX therapy and PET scan on July 27, 2020 concerning for recurrent disease. He presents here with nausea/vomiting which has been going on for sometime. He had an EGD at [facility] with the cardiothoracic surgeons for evaluation which noted wide open anastomosis and pylorus that was dilated to 20 mm without any resistance. He did have 400 CC's of residual fluid after being NPO concerning for gastroparesis. Today he feels well after IV fluids. As an outpatient he has been eating and drinking very slowly. He follows with the palliative care team with the cancer center.    Past Medical History:  Diagnosis Date  . Abnormal white blood cell (WBC) count    seeing Dr. [name] at [place] on 10/25  . Anemia   . Atrial fibrillation ([place])   . BPH (benign prostatic hypertrophy)   . Bradycardia   . CAD (coronary artery disease)   . Dysrhythmia    bradycardia - sees Dr. Humphrey Rolls  . Esophageal cancer ([place]) 05/2016  . GERD (gastroesophageal reflux disease)   . History of kidney stones   . Hyperlipidemia   . Hypertension   . Hypothyroidism   . Kidney stone   . Myocardial infarction ([place]) 2005  . Pneumonia   . Pneumonia 06/22/2020  . S/P angioplasty with stent 2005   x 4 vessels  . Sleep apnea    mild-NO CPAP  . Spinal stenosis    lumbar    Past Surgical History:  Procedure Laterality Date  . BACK SURGERY    . CARDIAC CATHETERIZATION Left 03/12/2016   Procedure: Left Heart Cath and Coronary Angiography;  Surgeon: [name], MD;  Location: [place] [facility];  Service: Cardiovascular;  Laterality: Left;   . CARDIAC CATHETERIZATION N/A 03/12/2016   Procedure: Coronary Stent Intervention;  Surgeon: Yolonda Kida, MD;  Location: [place] [facility];  Service: Cardiovascular;  Laterality: N/A;  . COLONOSCOPY WITH PROPOFOL N/A 07/13/2015   Procedure: COLONOSCOPY WITH PROPOFOL;  Surgeon: Lucilla Lame, MD;  Location: [place];  Service: Endoscopy;  Laterality: N/A;  . CORONARY ANGIOPLASTY  10/05 and 12/05   4 DES placed  . CYSTOSCOPY W/ RETROGRADES Left 06/15/2019   Procedure: CYSTOSCOPY WITH RETROGRADE PYELOGRAM;  Surgeon: Abbie Sons, MD;  Location: [facility];  Service: Urology;  Laterality: Left;  . CYSTOSCOPY/URETEROSCOPY/HOLMIUM LASER/STENT PLACEMENT Left 06/15/2019   Procedure: CYSTOSCOPY/URETEROSCOPY/HOLMIUM LASER/STENT PLACEMENT;  Surgeon: Abbie Sons, MD;  Location: [facility];  Service: Urology;  Laterality: Left;  . ESOPHAGECTOMY  10/07/2016   @ [facility]  . ESOPHAGOGASTRODUODENOSCOPY (EGD) WITH PROPOFOL N/A 06/14/2016   Procedure: ESOPHAGOGASTRODUODENOSCOPY (EGD) WITH PROPOFOL;  Surgeon: [name], MD;  Location: [facility];  Service: Endoscopy;  Laterality: N/A;  . HERNIA REPAIR Bilateral    x3  . INGUINAL HERNIA REPAIR Right 08/28/2017   Large PerFix plug, recurrent hernia;  Surgeon: Robert Bellow, MD;  Location: [facility];  Service: General;  Laterality: Right;  recurrent  . KIDNEY STONE SURGERY  07/15/2019  . POLYPECTOMY  07/13/2015   Procedure: POLYPECTOMY;  Surgeon: Lucilla Lame, MD;  Location: [place];  Service: Endoscopy;;  . PORT-A-CATH REMOVAL N/A 01/16/2017   Procedure: REMOVAL PORT-A-CATH;  Surgeon: Olean Ree, MD;  Location: [facility];  Service: General;  Laterality: N/A;  . PORTACATH PLACEMENT N/A 07/08/2016   Procedure: INSERTION PORT-A-CATH;  Surgeon: Olean Ree, MD;  Location: [facility];  Service: General;  Laterality: N/A;  . PORTACATH PLACEMENT Right 08/30/2019   Procedure: INSERTION PORT-A-CATH;  Surgeon: Olean Ree, MD;   Location: [facility];  Service: General;  Laterality: Right;    Family History  Problem Relation Age of Onset  . Hypertension Mother   . Dementia Mother   . Heart disease Father   . Hypertension Father   . Kidney disease Father   . Hypertension Brother   . Heart disease Brother   . Diabetes Son   . Hypertension Son   . Hyperlipidemia Son   . Hyperlipidemia Daughter   . Hypertension Daughter     Social History   Tobacco Use  . Smoking status: Former Smoker    Packs/day: 1.00    Years: 40.00    Pack years: 40.00    Types: Cigarettes    Quit date: 07/16/2004    Years since quitting: 16.0  . Smokeless tobacco: Former Systems developer    Types: Secondary school teacher  . Vaping Use: Never used  Substance Use Topics  . Alcohol use: Yes    Alcohol/week: 1.0 standard drink    Types: 1 Cans of beer per week  . Drug use: No    Prior to Admission medications   Medication Sig Start Date End Date Taking? Authorizing Provider  acetaminophen (TYLENOL) 500 MG tablet Take 500 mg by mouth every 6 (six) hours as needed for mild pain or moderate pain.    [provider]  amoxicillin-clavulanate (AUGMENTIN) 875-125 MG tablet Take 1 tablet by mouth 2 (two) times daily. 07/25/20   Tsosie Billing, MD  aspirin 81 MG tablet Take 81 mg by mouth daily.     [provider]  atorvastatin (LIPITOR) 40 MG tablet Take 1 tablet (40 mg total) by mouth daily. 05/01/20   Park Liter P, DO  clopidogrel (PLAVIX) 75 MG tablet Take 1 tablet (75 mg total) by mouth daily. 05/01/20   Johnson, Megan P, DO  diphenoxylate-atropine (LOMOTIL) 2.5-0.025 MG tablet Take 2 tablets by mouth 4 (four) times daily as needed for diarrhea or loose stools. 03/08/20   [name], MD  feeding supplement, ENSURE ENLIVE, (ENSURE ENLIVE) LIQD Take 237 mLs by mouth 2 (two) times daily between meals. 05/24/20   Raiford Noble Latif, DO  levothyroxine (SYNTHROID) 50 MCG tablet Take 1 tablet (50 mcg total) by mouth daily.  02/15/20   Johnson, Megan P, DO  loperamide (IMODIUM A-D) 2 MG tablet Take 2 mg by mouth 4 (four) times daily as needed for diarrhea or loose stools.     [provider]  metoprolol succinate (TOPROL-XL) 25 MG 24 hr tablet Take 0.5 tablets (12.5 mg total) by mouth daily. 05/24/20   Raiford Noble Latif, DO  multivitamin-iron-minerals-folic acid (CENTRUM) chewable tablet Chew 1 tablet by mouth daily.    [provider]  nitroGLYCERIN (NITROSTAT) 0.4 MG SL tablet Place 1 tablet under the tongue every 5 (five) minutes as needed for chest pain.  03/08/16   [provider]  ondansetron (ZOFRAN) 8 MG tablet Take 1 tablet (8 mg total) by mouth every 8 (eight) hours as needed for nausea. 07/31/20  Borders, Kirt Boys, NP  pantoprazole (PROTONIX) 20 MG tablet Take 20 mg by mouth daily.    [provider]  Probiotic Product ([place]) CAPS Take 1 capsule by mouth in the morning.    [provider]  prochlorperazine (COMPAZINE) 10 MG tablet Take 1 tablet (10 mg total) by mouth every 6 (six) hours as needed (Nausea or vomiting). 08/26/19   [name], MD    Current Facility-Administered Medications  Medication Dose Route Frequency Provider Last Rate Last Admin  . acetaminophen (TYLENOL) tablet 325 mg  325 mg Oral Q6H PRN Cox, Amy N, DO   325 mg at 08/10/20 3716   Or  . acetaminophen (TYLENOL) suppository 325 mg  325 mg Rectal Q6H PRN Cox, Amy N, DO      . aspirin EC tablet 81 mg  81 mg Oral Daily Cox, Amy N, DO   81 mg at 08/10/20 0827  . atorvastatin (LIPITOR) tablet 40 mg  40 mg Oral Daily Cox, Amy N, DO   40 mg at 08/10/20 0826  . clopidogrel (PLAVIX) tablet 75 mg  75 mg Oral Daily Cox, Amy N, DO   75 mg at 08/10/20 0827  . enoxaparin (LOVENOX) injection 40 mg  40 mg Subcutaneous Q24H Cox, Amy N, DO   40 mg at 08/10/20 0826  . feeding supplement (ENSURE ENLIVE / ENSURE PLUS) liquid 237 mL  237 mL Oral BID BM Cox, Amy N, DO   237 mL at 08/10/20  1145  . lactated ringers infusion   Intravenous Continuous Cox, Amy N, DO 100 mL/hr at 08/10/20 0400 Rate Verify at 08/10/20 0400  . levothyroxine (SYNTHROID) tablet 50 mcg  50 mcg Oral Daily Cox, Amy N, DO   50 mcg at 08/10/20 0557  . loperamide (IMODIUM) capsule 2 mg  2 mg Oral QID PRN Cox, Amy N, DO      . metoprolol succinate (TOPROL-XL) 24 hr tablet 12.5 mg  12.5 mg Oral Daily Cox, Amy N, DO   12.5 mg at 08/10/20 0826  . morphine 2 MG/ML injection 2 mg  2 mg Intravenous Q3H PRN Darliss Cheney, MD   2 mg at 08/10/20 0833  . multivitamin with minerals tablet 1 tablet  1 tablet Oral Daily Benita Gutter, RPH   1 tablet at 08/10/20 0827  . nitroGLYCERIN (NITROSTAT) SL tablet 0.4 mg  0.4 mg Sublingual Q5 min PRN Cox, Amy N, DO      . ondansetron (ZOFRAN) tablet 4 mg  4 mg Oral Q6H PRN Cox, Amy N, DO       Or  . ondansetron (ZOFRAN) injection 4 mg  4 mg Intravenous Q6H PRN Cox, Amy N, DO      . piperacillin-tazobactam (ZOSYN) IVPB 3.375 g  3.375 g Intravenous Q8H Cox, Amy N, DO 12.5 mL/hr at 08/10/20 0556 3.375 g at 08/10/20 0556  . prochlorperazine (COMPAZINE) tablet 10 mg  10 mg Oral Q6H PRN Cox, Amy N, DO        Allergies as of 08/09/2020  . (No Known Allergies)     Review of Systems:    All systems reviewed and negative except where noted in HPI.  Review of Systems  Constitutional: Positive for weight loss. Negative for chills and fever.  Respiratory: Positive for shortness of breath.   Cardiovascular: Negative for chest pain.  Gastrointestinal: Positive for vomiting. Negative for abdominal pain, blood in stool, constipation, heartburn and melena.  Musculoskeletal: Positive for joint pain.  Psychiatric/Behavioral:  Negative for substance abuse.  All other systems reviewed and are negative.     Physical Exam:  Vital signs in last 24 hours: Temp:  [97.6 F (36.4 C)-98.7 F (37.1 C)] 98.6 F (37 C) (11/25 1114) Pulse Rate:  [54-78] 70 (11/25 1114) Resp:  [18-23] 18 (11/25  1114) BP: (84-125)/(42-83) 84/42 (11/25 1130) SpO2:  [84 %-98 %] 84 % (11/25 1114) Weight:  [53.5 kg] 53.5 kg (11/24 1338) Last BM Date: 08/09/20   General:   Pleasant in NAD Head:  Normocephalic and atraumatic. Eyes:   No icterus.   Conjunctiva pink. Mouth: Mucosa pink moist, no lesions. Neck:  Supple; no masses felt Lungs:  No respiratory distress but on 2L at baseline Heart:  RRR Abdomen:   Non-tender, non-distended Msk:  MAEW x4, No clubbing or cyanosis.  Neurologic:  Alert and  oriented x4;  No focal deficits Skin:  Warm, dry, pink without significant lesions or rashes. Psych:  Alert and cooperative. Normal affect.  LAB RESULTS: Recent Labs    08/09/20 1341 08/10/20 0609  WBC 12.1* 7.4  HGB 9.4* 8.5*  HCT 31.5* 28.0*  PLT 180 136*   BMET Recent Labs    08/09/20 1341 08/10/20 0609  NA 137 134*  K 4.5 4.1  CL 97* 97*  CO2 32 29  GLUCOSE 134* 75  BUN 34* 32*  CREATININE 1.46* 1.30*  CALCIUM 8.7* 8.2*   LFT Recent Labs    08/09/20 1341  PROT 8.1  ALBUMIN 2.2*  AST 32  ALT 25  ALKPHOS 68  BILITOT 0.7   PT/INR No results for input(s): LABPROT, INR in the last 72 hours.  STUDIES: CT CHEST ABDOMEN PELVIS W CONTRAST  Result Date: 08/09/2020 CLINICAL DATA:  Nausea and vomiting in a patient with known esophageal cancer. EXAM: CT CHEST, ABDOMEN, AND PELVIS WITH CONTRAST TECHNIQUE: Multidetector CT imaging of the chest, abdomen and pelvis was performed following the standard protocol during bolus administration of intravenous contrast. CONTRAST:  98mL OMNIPAQUE IOHEXOL 300 MG/ML  SOLN COMPARISON:  Multiple prior studies including CT of the abdomen and pelvis from April 22, 2019 FINDINGS: CT CHEST FINDINGS Cardiovascular: Three-vessel coronary artery disease. Heart size is stable. Aortic caliber is stable at approximately 3.6 cm. RIGHT-sided Port-A-Cath terminates in the mid superior vena cava as on the recent chest CT from 05/21/2020. Central pulmonary vasculature  is unremarkable on venous phase assessment. Mediastinum/Nodes: Post esophagectomy with gastric pull-through. Patulous appearance of the pull-through which is filled partially with oral contrast media. No adenopathy in the chest. Lungs/Pleura: Numerous areas of airspace disease with nodular features and patchy areas of ground-glass. This is worse than on the study of May 21, 2020 now with involvement in the LEFT chest as well as the RIGHT and with more extensive involvement in the RIGHT chest when compared to the prior study. There is also slight interval enlargement of a very small RIGHT pleural effusion and interval development of a LEFT pleural effusion since the prior study. Musculoskeletal: See below for full musculoskeletal details. CT ABDOMEN PELVIS FINDINGS Hepatobiliary: No focal, suspicious hepatic lesion. The portal vein is patent. Gallbladder is decompressed. No biliary duct dilation. Pancreas: Soft tissue along the posterior pancreas may have diminished in size but obliterates the normal fat about the celiac axis similar to distribution seen on prior study but again perhaps diminished in size. No intrapancreatic lesion or signs of inflammation. Spleen: Normal Adrenals/Urinary Tract: Normal adrenal glands. Symmetric renal enhancement. No hydronephrosis. The LEFT renal pelvic calculus has  migrated slightly further into the renal pelvis measuring approximately 8 mm. Increasing stone burden in the LEFT kidney since previous imaging with another calculus in the interpolar calyx and infundibulum approximately 5 mm. Distal ureters are difficult to assess. Urinary bladder is under distended. Stomach/Bowel: Soft tissue about the hepatic gastric recess measuring approximately 3.4 x 2.1 cm in axial dimension. Less bulky appearance along the LEFT periaortic region where it previously measured approximately 2.3 cm in greatest thickness and now measures 1.3 cm. Greatest maximal dimension of soft tissue on the  prior study was approximately 3.9 by 2.6 cm. No sign of small bowel obstruction. The ingested contrast material passes through the small bowel with early opacification of the cecum which is present in the pelvis. Stool filled colon with signs of colonic diverticulosis. Ascending colonic thickening colonic thickening. Vascular/Lymphatic: Calcified atheromatous plaque in the abdominal aorta. Aneurysmal dilation of the infrarenal abdominal aorta approximately 2.9 cm greatest caliber similar to the prior study accounting for tortuosity in the axial plane. Reproductive: Prostate heterogeneous on CT with nonspecific appearance. Other: Diffuse loss of subcutaneous fat and mesenteric fat since the prior imaging evaluation makes assessment more difficult. Appendix not visualized without overt signs of acute appendicitis. Musculoskeletal: No acute musculoskeletal process. Spinal degenerative changes. IMPRESSION: 1. Findings of bilateral pneumonia with worsening compared to previous imaging. The possibility of background pulmonary metastatic disease is also considered based on previous PET imaging. There is rapid worsening in some areas, particularly the RIGHT upper lobe and LEFT lung base when compared to the previous more recent PET scan which would support a worsening overlay of infection perhaps from aspiration. 2. Ascending colonic thickening may be related to colitis, correlate with any risk factors for infectious colitis in the setting of recent antibiotic administration. 3. Post esophagectomy with gastric pull-through. Patulous appearance of the pull-through which is filled partially with oral contrast media further supporting the possibility of aspiration in the setting of repeated nausea and vomiting. 4. Decreased soft tissue is suggested in the upper abdomen with limited assessment due to cachexia and early venous phase on the current study. 5. filling of the cecum with ingested contrast material, cecum now in the  pelvis. Appendix not visualized. No secondary signs to suggest acute appendicitis. Assessment is however limited due to lack of intra-abdominal fat. If there is continued pain or symptoms that localized to the pelvis, repeat scanning could be considered after passage of oral contrast further into the colon. 6. Increased stone burden in the LEFT kidney. 7. Aneurysmal dilation of the infrarenal abdominal aorta approximately 2.9 cm greatest caliber similar to the prior study accounting for tortuosity in the axial plane. 8. Cachexia. Aortic Atherosclerosis (ICD10-I70.0). Electronically Signed   By: Zetta Bills M.D.   On: 08/09/2020 19:09   DG Abdomen Acute W/Chest  Result Date: 08/09/2020 CLINICAL DATA:  Vomiting for 10 days EXAM: DG ABDOMEN ACUTE WITH 1 VIEW CHEST COMPARISON:  04/01/2019, 04/22/2019 FINDINGS: There is no evidence of dilated bowel loops or free intraperitoneal air. 8 mm rounded calcification projects in the region of the left renal shadow. Tortuous abdominal aorta with atherosclerotic calcifications. Stable right-sided chest port. Cardiomediastinal contours are within normal limits. Worsening bilateral airspace opacities, most confluent within the right upper lobe and left lower lobe. No large pleural fluid collection. No pneumothorax. IMPRESSION: 1. Worsening multifocal pneumonia. 2. Nonobstructive bowel gas pattern. 3. 8 mm calcification projects in the region of the left renal shadow. Electronically Signed   By: Davina Poke D.O.  On: 08/09/2020 16:28       Impression / Plan:   74 y/o gentleman s/p esophagectomy secondary to esophageal cancer with recurrence and now with gastroparesis likely secondary to vagal nerve injury. Currently feeling better after IV fluids. Unfortunately not much can be done at the moment besides adjusting diet and a trial of pro-kinetic agent such as erythromycin which he just started at home and/or reglan. His surgery team mentioned gastrojejunostomy  which would be one option verse j-tube which patient is not interested in.  - provided hand out on gastroparesis diet - continue erythromycin versus prn reglan if QTC normal - can follow-up with his providers as an outpatient to discuss goals of care  Please call with any questions or concerns. We will sign-off.  Raylene Miyamoto MD, MPH [place]

## 2020-08-11 DIAGNOSIS — Z7189 Other specified counseling: Secondary | ICD-10-CM | POA: Diagnosis not present

## 2020-08-11 DIAGNOSIS — R634 Abnormal weight loss: Secondary | ICD-10-CM

## 2020-08-11 DIAGNOSIS — Z515 Encounter for palliative care: Secondary | ICD-10-CM | POA: Diagnosis not present

## 2020-08-11 DIAGNOSIS — J690 Pneumonitis due to inhalation of food and vomit: Secondary | ICD-10-CM | POA: Diagnosis not present

## 2020-08-11 DIAGNOSIS — Z66 Do not resuscitate: Secondary | ICD-10-CM

## 2020-08-11 DIAGNOSIS — E860 Dehydration: Secondary | ICD-10-CM | POA: Diagnosis not present

## 2020-08-11 LAB — COMPREHENSIVE METABOLIC PANEL
ALT: 20 U/L (ref 0–44)
AST: 30 U/L (ref 15–41)
Albumin: 1.9 g/dL — ABNORMAL LOW (ref 3.5–5.0)
Alkaline Phosphatase: 58 U/L (ref 38–126)
Anion gap: 9 (ref 5–15)
BUN: 29 mg/dL — ABNORMAL HIGH (ref 8–23)
CO2: 28 mmol/L (ref 22–32)
Calcium: 7.8 mg/dL — ABNORMAL LOW (ref 8.9–10.3)
Chloride: 101 mmol/L (ref 98–111)
Creatinine, Ser: 1.34 mg/dL — ABNORMAL HIGH (ref 0.61–1.24)
GFR, Estimated: 56 mL/min — ABNORMAL LOW (ref >60)
Glucose, Bld: 144 mg/dL — ABNORMAL HIGH (ref 70–99)
Potassium: 3.8 mmol/L (ref 3.5–5.1)
Sodium: 138 mmol/L (ref 135–145)
Total Bilirubin: 0.3 mg/dL (ref 0.3–1.2)
Total Protein: 6.8 g/dL (ref 6.5–8.1)

## 2020-08-11 LAB — CBC WITH DIFFERENTIAL/PLATELET
Abs Immature Granulocytes: 0.1 10*3/uL — ABNORMAL HIGH (ref 0.00–0.07)
Basophils Absolute: 0 10*3/uL (ref 0.0–0.1)
Basophils Relative: 0 %
Eosinophils Absolute: 0.1 10*3/uL (ref 0.0–0.5)
Eosinophils Relative: 1 %
HCT: 24.1 % — ABNORMAL LOW (ref 39.0–52.0)
Hemoglobin: 7.4 g/dL — ABNORMAL LOW (ref 13.0–17.0)
Immature Granulocytes: 1 %
Lymphocytes Relative: 4 %
Lymphs Abs: 0.4 10*3/uL — ABNORMAL LOW (ref 0.7–4.0)
MCH: 27.3 pg (ref 26.0–34.0)
MCHC: 30.7 g/dL (ref 30.0–36.0)
MCV: 88.9 fL (ref 80.0–100.0)
Monocytes Absolute: 0.6 10*3/uL (ref 0.1–1.0)
Monocytes Relative: 5 %
Neutro Abs: 10.6 10*3/uL — ABNORMAL HIGH (ref 1.7–7.7)
Neutrophils Relative %: 89 %
Platelets: 129 10*3/uL — ABNORMAL LOW (ref 150–400)
RBC: 2.71 MIL/uL — ABNORMAL LOW (ref 4.22–5.81)
RDW: 18.8 % — ABNORMAL HIGH (ref 11.5–15.5)
Smear Review: NORMAL
WBC: 11.8 10*3/uL — ABNORMAL HIGH (ref 4.0–10.5)
nRBC: 0 % (ref 0.0–0.2)

## 2020-08-11 LAB — C DIFFICILE QUICK SCREEN W PCR REFLEX
C Diff antigen: NEGATIVE
C Diff interpretation: NOT DETECTED
C Diff toxin: NEGATIVE

## 2020-08-11 LAB — LACTIC ACID, PLASMA
Lactic Acid, Venous: 1.6 mmol/L (ref 0.5–1.9)
Lactic Acid, Venous: 1.4 mmol/L (ref 0.5–1.9)

## 2020-08-11 LAB — MAGNESIUM: Magnesium: 2 mg/dL (ref 1.7–2.4)

## 2020-08-11 MED ORDER — CHLORHEXIDINE GLUCONATE CLOTH 2 % EX PADS
6.0000 | MEDICATED_PAD | Freq: Every day | CUTANEOUS | Status: DC
  Administered 2020-08-11 – 2020-08-14 (×4): 6 via TOPICAL

## 2020-08-11 MED ORDER — SODIUM CHLORIDE 0.9 % IV SOLN: INTRAVENOUS | Status: AC

## 2020-08-11 MED ORDER — SODIUM CHLORIDE 0.9 % IV SOLN: INTRAVENOUS | Status: DC

## 2020-08-11 NOTE — Care Management Important Message (Signed)
Important Message  Patient Details  Name: Corey Perry MRN: [id number] Date of Birth: [birth year]   Medicare Important Message Given:  N/A - LOS <3 / Initial given by admissions  Initial Medicare IM reviewed with patient by Meredith Mody, Patient Access Associate on 08/11/2020 at 9:57am.    Dannette Barbara 08/11/2020, 12:21 PM

## 2020-08-11 NOTE — Progress Notes (Addendum)
Patient is on Enteric Precautions. CSW attempted call into patient's room x 3 and patient's cell phone for Readmission Screening. Unable to reach patient. Will leave note for Weekend TOC to try again.  CSW was informed by [facility] Representative that they are active with patient for Outpatient Palliative Care.  Oleh Genin, [place]

## 2020-08-11 NOTE — Consult Note (Signed)
Consultation Note Date: 08/11/2020   Patient Name: Corey Perry  DOB: June 09, 1946  MRN: [id number]  Age / Sex: 74 y.o., male  PCP: Valerie Roys, DO Referring Physician: Darliss Cheney, MD  Reason for Consultation: Establishing goals of care  HPI/Patient Profile: 74 y.o. male  with past medical history of esophageal cancer s/p chemo and radiation and esophagectomy, dysphagia, CAD, GERD, HLD, HTN, hypothyroidism, and MI admitted on 08/09/2020 with nausea and vomiting.  He had esophageal dilatation procedure on 08/03/2020, he does feel better than prior to the procedure.  Per GI, patient symptoms are consistent with gastroparesis.  He was started on erythromycin and reports feeling better. Also with respiratory failure secondary to aspiration pneumonia. CT reveals worsening bilateral pna. ?Possibility of pulmonary mets. Patient followed extensively by palliative care through cancer center and outpatient palliative care through [facility]. PMT consulted for [place].  Clinical Assessment and Goals of Care: I have reviewed medical records including EPIC notes, labs and imaging, assessed the patient and then met with patient  to discuss diagnosis prognosis, GOC, EOL wishes, disposition and options.  I introduced Palliative Medicine as specialized medical care for people living with serious illness. It focuses on providing relief from the symptoms and stress of a serious illness. The goal is to improve quality of life for both the patient and the family.  Patient is familiar with palliative care as he sees palliative provider through cancer center and at home through [facility].   Well documented in chart that patient is DNR/DNI. Also documented that he has living will stating desire for a natural death. Has stated he is not interested in J tube.   As far as functional and nutritional status, patient tells me he has been doing well at home - no change in functional  status. He tells me of his significant weight loss. He tells me of his plan to eat very slowly - eat calorie rich foods. Tells me he eats about half of what is given to him.   We discussed patient's current illness and what it means in the larger context of patient's on-going co-morbidities.  Natural disease trajectory and expectations at EOL were discussed.  I attempted to elicit values and goals of care important to the patient.  He tells me his intent to "take it one day at a time". Tells me he would like to keep focusing on "getting stronger" and "gaining weight".  The difference between aggressive medical intervention and comfort care was considered in light of the patient's goals of care.   Discussed with patient the importance of continued conversation with family and the medical providers regarding overall plan of care and treatment options, ensuring decisions are within the context of the patient's values and GOCs.    Hospice services outpatient were explained and offered. Discussed with patient that he is likely eligible for hospice services whenever he is ready. Discussed philosophy of hospice care and type of service provided. He tells me this has not been discussed with him before and he is not sure that he is ready for hospice services but will think about it.   Questions and concerns were addressed. The family was encouraged to call with questions or concerns.   Primary Decision Maker PATIENT    SUMMARY OF RECOMMENDATIONS   - ongoing [place] - patient followed closely by outpatient palliative - have asked they follow up soon - hospice introduced - patient considering but not interested in initiating services currently  Code Status/Advance Care Planning:  DNR  Discharge Planning: To Be Determined      Primary Diagnoses: Present on Admission: . Vomiting   I have reviewed the medical record, interviewed the patient and family, and examined the patient. The following  aspects are pertinent.  Past Medical History:  Diagnosis Date  . Abnormal white blood cell (WBC) count    seeing Dr. [name] at [facility] on 10/25  . Anemia   . Atrial fibrillation ([place])   . BPH (benign prostatic hypertrophy)   . Bradycardia   . CAD (coronary artery disease)   . Dysrhythmia    bradycardia - sees Dr. Humphrey Rolls  . Esophageal cancer ([place]) 05/2016  . GERD (gastroesophageal reflux disease)   . History of kidney stones   . Hyperlipidemia   . Hypertension   . Hypothyroidism   . Kidney stone   . Myocardial infarction ([place]) 2005  . Pneumonia   . Pneumonia 06/22/2020  . S/P angioplasty with stent 2005   x 4 vessels  . Sleep apnea    mild-NO CPAP  . Spinal stenosis    lumbar   Social History   Socioeconomic History  . Marital status: Married    Spouse name: Not on file  . Number of children: Not on file  . Years of education: Not on file  . Highest education level: 9th grade  Occupational History  . Not on file  Tobacco Use  . Smoking status: Former Smoker    Packs/day: 1.00    Years: 40.00    Pack years: 40.00    Types: Cigarettes    Quit date: 07/16/2004    Years since quitting: 16.0  . Smokeless tobacco: Former Systems developer    Types: Secondary school teacher  . Vaping Use: Never used  Substance and Sexual Activity  . Alcohol use: Yes    Alcohol/week: 1.0 standard drink    Types: 1 Cans of beer per week  . Drug use: No  . Sexual activity: Yes    Birth control/protection: None  Other Topics Concern  . Not on file  Social History Narrative  . Not on file   Social Determinants of Health   Financial Resource Strain:   . Difficulty of Paying Living Expenses: Not on file  Food Insecurity:   . Worried About Charity fundraiser in the Last Year: Not on file  . Ran Out of Food in the Last Year: Not on file  Transportation Needs:   . Lack of Transportation (Medical): Not on file  . Lack of Transportation (Non-Medical): Not on file  Physical Activity:   . Days of  Exercise per Week: Not on file  . Minutes of Exercise per Session: Not on file  Stress:   . Feeling of Stress : Not on file  Social Connections:   . Frequency of Communication with Friends and Family: Not on file  . Frequency of Social Gatherings with Friends and Family: Not on file  . Attends Religious Services: Not on file  . Active Member of Clubs or Organizations: Not on file  . Attends Archivist Meetings: Not on file  . Marital Status: Not on file   Family History  Problem Relation Age of Onset  . Hypertension Mother   . Dementia Mother   . Heart disease Father   . Hypertension Father   . Kidney disease Father   . Hypertension Brother   . Heart disease Brother   . Diabetes Son   . Hypertension Son   .  Hyperlipidemia Son   . Hyperlipidemia Daughter   . Hypertension Daughter    Scheduled Meds: . aspirin EC  81 mg Oral Daily  . atorvastatin  40 mg Oral Daily  . Chlorhexidine Gluconate Cloth  6 each Topical Daily  . clopidogrel  75 mg Oral Daily  . enoxaparin (LOVENOX) injection  40 mg Subcutaneous Q24H  . feeding supplement  237 mL Oral BID BM  . levothyroxine  50 mcg Oral Daily  . metoprolol succinate  12.5 mg Oral Daily  . multivitamin with minerals  1 tablet Oral Daily  . pantoprazole  20 mg Oral Daily   Continuous Infusions: . sodium chloride    . ampicillin-sulbactam (UNASYN) IV 3 g (08/11/20 1150)  . erythromycin 250 mg (08/11/20 0756)   PRN Meds:.acetaminophen **OR** acetaminophen, loperamide, morphine injection, nitroGLYCERIN, ondansetron **OR** ondansetron (ZOFRAN) IV, prochlorperazine No Known Allergies Review of Systems  Constitutional: Positive for activity change, appetite change and fatigue.  Respiratory: Positive for cough.     Physical Exam Constitutional:      General: He is not in acute distress. Pulmonary:     Effort: Pulmonary effort is normal.  Skin:    General: Skin is warm and dry.  Neurological:     Mental Status: He is  alert and oriented to person, place, and time.  Psychiatric:        Mood and Affect: Mood normal.        Behavior: Behavior normal.     Vital Signs: BP (!) 91/39 (BP Location: Right Arm)   Pulse 79   Temp 97.9 F (36.6 C)   Resp 20   Ht _0  (1.753 m)   Wt 53.5 kg   SpO2 92%   BMI 17.43 kg/m  Pain Scale: 0-10   Pain Score: 6    SpO2: SpO2: 92 % O2 Device:SpO2: 92 % O2 Flow Rate: .O2 Flow Rate (L/min): 2 L/min  IO: Intake/output summary:   Intake/Output Summary (Last 24 hours) at 08/11/2020 1345 Last data filed at 08/11/2020 0425 Gross per 24 hour  Intake 400 ml  Output 400 ml  Net 0 ml    LBM: Last BM Date: 08/11/20 Baseline Weight: Weight: 53.5 kg Most recent weight: Weight: 53.5 kg     Palliative Assessment/Data: PPS 50%    Time Total: 55 minutes Greater than 50%  of this time was spent counseling and coordinating care related to the above assessment and plan.  Juel Burrow, DNP, AGNP-C Palliative Medicine Team [phone] Pager: [phone]

## 2020-08-11 NOTE — Consult Note (Signed)
Pharmacy Antibiotic Note  Corey Perry is a 74 y.o. male with medical history significant for esophageal cancer, Afib, CAD admitted on 08/09/2020 with pneumonia and N/V with suspected gastroparesis and colitis. Patient initially started on Zosyn and now narrowing to Unasyn.   Pharmacy has been consulted for Unasyn dosing.  Plan: Continue Unasyn 3 g IV q6h  Height: 5\' 9"  (175.3 cm) Weight: 53.5 kg (118 lb) IBW/kg (Calculated) : 70.7  Temp (24hrs), Avg:99 F (37.2 C), Min:98.6 F (37 C), Max:99.5 F (37.5 C)  Recent Labs  Lab 08/09/20 1341 08/10/20 0609 08/10/20 1532 08/10/20 1823 08/11/20 0500  WBC 12.1* 7.4  --   --  11.8*  CREATININE 1.46* 1.30*  --   --  1.34*  LATICACIDVEN  --   --  1.8 1.9  --     Estimated Creatinine Clearance: 37.2 mL/min (A) (by C-G formula based on SCr of 1.34 mg/dL (H)).    No Known Allergies  Antimicrobials this admission: Zosyn 11/24 >> 11/25 Unasyn 11/25 >>   Dose adjustments this admission: N/A  Microbiology results: C diff testing pending  Thank you for allowing pharmacy to be a part of this patient's care.  Lu Duffel, PharmD, BCPS Clinical Pharmacist 08/11/2020 9:25 AM

## 2020-08-11 NOTE — Progress Notes (Signed)
PROGRESS NOTE    Corey Perry  CNO:[id number] DOB: 05-05-1946 DOA: 08/09/2020 PCP: Valerie Roys, DO   Brief Narrative:  HPI: Corey Perry is a 74 y.o. male with medical history significant for   Ate egg and sausage sandwich before coming to hospital and did not have nausea.  He states he understands that he needs to eat slowly and drink slowly.  He reports that he only vomited when asked to drink the 'medicine' in the ED.   He denies fever, headache, dizziness, new cough, chest pain, shortness of breath, abdominal pain, new back pain, dysuria, hematuria, urgency, urinary retention, increased urinary frequency, diarrhea, constipation, blood in stool, changes in strength.  He endorses nausea and vomiting.  He endorses baseline shortness of breath that is unchanged.  He reports that the symptoms are baseline for him.  He endorses that since the esophageal dilatation procedure on 08/03/2020, he does feel better than prior to the procedure.  Lives with spouse. 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 He was a former tobacco user (started smoking at age 37, quit 2005), 4 stents at [facility] in 2005 and one stent in 2017. Endorses infrequent ETOH use (1/2 glass of whiskey per week).   ED Course: Discussed with ED provider, requesting admission for intractable nausea and vomiting and GI study.   Assessment & Plan:   Active Problems:   Vomiting   Dehydration  Vomiting / dehydration/gastroparesis: Patient has a history of adenocarcinoma lower third of esophagus with history of esophagectomy on October 07, 2016.  GI was consulted.  Per GI, patient symptoms are consistent with gastroparesis.  He has been started on erythromycin lately.  No further intervention planned or recommended by GI.  He is feeling better today since he was started on erythromycin yesterday.  No more nausea.  Acute on chronic hypoxic respiratory failure secondary to aspiration  pneumonia: Per his wife, patient has been taking Augmentin for about 3 weeks.  CT chest abdomen and pelvis once again shows findings of bilateral pneumonia, worsening compared to previous imaging.  Possibility of background pulmonary metastasis is also considered based on previous PET scans. Patient is feeling better today.  Now back to his baseline of 2 L of oxygen without having any dyspnea.  Continue Zosyn.  History of CAD-, no symptoms.  Continue aspirin and Plavix  History of A. fib-continue metoprolol succinate 12.5 daily  Hyperlipidemia-continue 40 mg atorvastatin nightly  Hypothyroid-continue levothyroxine 50 mcg in the morning  Possible ascending colitis: CT abdomen pelvis indicates ascending colitis.  Per patient, he had diarrhea before but not anymore.  He had soft bowel movement today.  C. difficile negative.  Remove contact isolation.  Hypotension: Patient's blood pressure is running low with a systolic around 29U.  According to information provided by his wife and him, his systolic usually runs around 110.  Patient has no symptoms.  He received IV fluid bolus this morning.  I am going to start him on IV fluids 75 cc/h to keep his blood pressure high.  We will monitor for another day.  Will check lactic acid.  Goal of care: Due to multiple medical problems and cancer, I have consulted palliative care to have goal of care conversation with patient and his wife.  DVT prophylaxis: enoxaparin (LOVENOX) injection 40 mg Start: 08/10/20 1000 Place TED hose Start: 08/09/20 2207   Code Status: DNR  Family Communication: None present at bedside.  Plan of care discussed with patient.  Updated his wife  over the phone.  Status is: Inpatient  Remains inpatient appropriate because:Hemodynamically unstable   Dispo: The patient is from: Home              Anticipated d/c is to: Home              Anticipated d/c date is: 1 day              Patient currently is not medically stable to  d/c.        Estimated body mass index is 17.43 kg/m as calculated from the following:   Height as of this encounter: 5\' 9"  (1.753 m).   Weight as of this encounter: 53.5 kg.      Nutritional status:               Consultants:   GI  Procedures:   None  Antimicrobials:  Anti-infectives (From admission, onward)   Start     Dose/Rate Route Frequency Ordered Stop   08/10/20 1600  erythromycin 250 mg in sodium chloride 0.9 % 100 mL IVPB        250 mg 100 mL/hr over 60 Minutes Intravenous Every 8 hours 08/10/20 1500     08/10/20 1600  Ampicillin-Sulbactam (UNASYN) 3 g in sodium chloride 0.9 % 100 mL IVPB        3 g 200 mL/hr over 30 Minutes Intravenous Every 6 hours 08/10/20 1514     08/09/20 2200  azithromycin (ZITHROMAX) 500 mg in sodium chloride 0.9 % 250 mL IVPB  Status:  Discontinued        500 mg 250 mL/hr over 60 Minutes Intravenous Every 24 hours 08/09/20 2050 08/09/20 2135   08/09/20 2200  cefTRIAXone (ROCEPHIN) 1 g in sodium chloride 0.9 % 100 mL IVPB  Status:  Discontinued        1 g 200 mL/hr over 30 Minutes Intravenous  Once 08/09/20 2050 08/09/20 2135   08/09/20 2200  piperacillin-tazobactam (ZOSYN) IVPB 3.375 g  Status:  Discontinued        3.375 g 12.5 mL/hr over 240 Minutes Intravenous Every 8 hours 08/09/20 2135 08/10/20 1506         Subjective: Seen and examined.  Feeling better.  No nausea.  No shortness of breath.  More talkative and alert today.  Objective: Vitals:   08/11/20 0450 08/11/20 1008 08/11/20 1159 08/11/20 1203  BP: (!) 95/55 (!) 105/54 (!) 105/48 (!) 91/39  Pulse: 64 68 79 79  Resp: 18 16  20   Temp: 99.1 F (37.3 C) 97.9 F (36.6 C)    TempSrc: Oral     SpO2: (!) 85% (!) 84%  92%  Weight:      Height:        Intake/Output Summary (Last 24 hours) at 08/11/2020 1304 Last data filed at 08/11/2020 0425 Gross per 24 hour  Intake 400 ml  Output 400 ml  Net 0 ml   Filed Weights   08/09/20 1338  Weight: 53.5 kg     Examination:  General exam: Appears calm and comfortable  Respiratory system: Bilateral scattered rhonchi. Respiratory effort normal. Cardiovascular system: S1 & S2 heard, RRR. No JVD, murmurs, rubs, gallops or clicks. No pedal edema. Gastrointestinal system: Abdomen is nondistended, soft and nontender. No organomegaly or masses felt. Normal bowel sounds heard. Central nervous system: Alert and oriented. No focal neurological deficits. Extremities: Symmetric 5 x 5 power. Skin: No rashes, lesions or ulcers.  Psychiatry: Judgement and insight appear poor. Mood & affect  appropriate.   Data Reviewed: I have personally reviewed following labs and imaging studies  CBC: Recent Labs  Lab 08/09/20 1341 08/10/20 0609 08/11/20 0500  WBC 12.1* 7.4 11.8*  NEUTROABS  --   --  10.6*  HGB 9.4* 8.5* 7.4*  HCT 31.5* 28.0* 24.1*  MCV 91.0 90.3 88.9  PLT 180 136* 938*   Basic Metabolic Panel: Recent Labs  Lab 08/09/20 1341 08/10/20 0609 08/11/20 0500  NA 137 134* 138  K 4.5 4.1 3.8  CL 97* 97* 101  CO2 32 29 28  GLUCOSE 134* 75 144*  BUN 34* 32* 29*  CREATININE 1.46* 1.30* 1.34*  CALCIUM 8.7* 8.2* 7.8*  MG  --   --  2.0   GFR: Estimated Creatinine Clearance: 37.2 mL/min (A) (by C-G formula based on SCr of 1.34 mg/dL (H)). Liver Function Tests: Recent Labs  Lab 08/09/20 1341 08/11/20 0500  AST 32 30  ALT 25 20  ALKPHOS 68 58  BILITOT 0.7 0.3  PROT 8.1 6.8  ALBUMIN 2.2* 1.9*   Recent Labs  Lab 08/09/20 1341  LIPASE 28   No results for input(s): AMMONIA in the last 168 hours. Coagulation Profile: No results for input(s): INR, PROTIME in the last 168 hours. Cardiac Enzymes: No results for input(s): CKTOTAL, CKMB, CKMBINDEX, TROPONINI in the last 168 hours. BNP (last 3 results) No results for input(s): PROBNP in the last 8760 hours. HbA1C: No results for input(s): HGBA1C in the last 72 hours. CBG: No results for input(s): GLUCAP in the last 168 hours. Lipid  Profile: No results for input(s): CHOL, HDL, LDLCALC, TRIG, CHOLHDL, LDLDIRECT in the last 72 hours. Thyroid Function Tests: No results for input(s): TSH, T4TOTAL, FREET4, T3FREE, THYROIDAB in the last 72 hours. Anemia Panel: No results for input(s): VITAMINB12, FOLATE, FERRITIN, TIBC, IRON, RETICCTPCT in the last 72 hours. Sepsis Labs: Recent Labs  Lab 08/09/20 1723 08/10/20 1532 08/10/20 1823  PROCALCITON 0.20  --   --   LATICACIDVEN  --  1.8 1.9    Recent Results (from the past 240 hour(s))  Resp Panel by RT-PCR (Flu A&B, Covid) Nasopharyngeal Swab     Status: None   Collection Time: 08/09/20  5:23 PM   Specimen: Nasopharyngeal Swab; Nasopharyngeal(NP) swabs in vial transport medium  Result Value Ref Range Status   SARS Coronavirus 2 by RT PCR NEGATIVE NEGATIVE Final    Comment: (NOTE) SARS-CoV-2 target nucleic acids are NOT DETECTED.  The SARS-CoV-2 RNA is generally detectable in upper respiratory specimens during the acute phase of infection. The lowest concentration of SARS-CoV-2 viral copies this assay can detect is 138 copies/mL. A negative result does not preclude SARS-Cov-2 infection and should not be used as the sole basis for treatment or other patient management decisions. A negative result may occur with  improper specimen collection/handling, submission of specimen other than nasopharyngeal swab, presence of viral mutation(s) within the areas targeted by this assay, and inadequate number of viral copies(<138 copies/mL). A negative result must be combined with clinical observations, patient history, and epidemiological information. The expected result is Negative.  Fact Sheet for Patients:  EntrepreneurPulse.com.au  Fact Sheet for Healthcare Providers:  IncredibleEmployment.be  This test is no t yet approved or cleared by the [place] FDA and  has been authorized for detection and/or diagnosis of SARS-CoV-2 by FDA  under an Emergency Use Authorization (EUA). This EUA will remain  in effect (meaning this test can be used) for the duration of the COVID-19 declaration under Section  564(b)(1) of the Act, 21 U.S.C.section 360bbb-3(b)(1), unless the authorization is terminated  or revoked sooner.       Influenza A by PCR NEGATIVE NEGATIVE Final   Influenza B by PCR NEGATIVE NEGATIVE Final    Comment: (NOTE) The Xpert Xpress SARS-CoV-2/FLU/RSV plus assay is intended as an aid in the diagnosis of influenza from Nasopharyngeal swab specimens and should not be used as a sole basis for treatment. Nasal washings and aspirates are unacceptable for Xpert Xpress SARS-CoV-2/FLU/RSV testing.  Fact Sheet for Patients: EntrepreneurPulse.com.au  Fact Sheet for Healthcare Providers: IncredibleEmployment.be  This test is not yet approved or cleared by the [place] FDA and has been authorized for detection and/or diagnosis of SARS-CoV-2 by FDA under an Emergency Use Authorization (EUA). This EUA will remain in effect (meaning this test can be used) for the duration of the COVID-19 declaration under Section 564(b)(1) of the Act, 21 U.S.C. section 360bbb-3(b)(1), unless the authorization is terminated or revoked.  Performed at [facility], [address] [place], [place] [address]   C Difficile Quick Screen w PCR reflex     Status: None   Collection Time: 08/10/20 10:42 AM   Specimen: STOOL  Result Value Ref Range Status   C Diff antigen NEGATIVE NEGATIVE Final   C Diff toxin NEGATIVE NEGATIVE Final   C Diff interpretation No C. difficile detected.  Final    Comment: Performed at [facility], [address] [place], [place] [address]      Radiology Studies: CT CHEST ABDOMEN PELVIS W CONTRAST  Result Date: 08/09/2020 CLINICAL DATA:  Nausea and vomiting in a patient with known esophageal cancer. EXAM: CT CHEST, ABDOMEN, AND PELVIS WITH  CONTRAST TECHNIQUE: Multidetector CT imaging of the chest, abdomen and pelvis was performed following the standard protocol during bolus administration of intravenous contrast. CONTRAST:  83mL OMNIPAQUE IOHEXOL 300 MG/ML  SOLN COMPARISON:  Multiple prior studies including CT of the abdomen and pelvis from April 22, 2019 FINDINGS: CT CHEST FINDINGS Cardiovascular: Three-vessel coronary artery disease. Heart size is stable. Aortic caliber is stable at approximately 3.6 cm. RIGHT-sided Port-A-Cath terminates in the mid superior vena cava as on the recent chest CT from 05/21/2020. Central pulmonary vasculature is unremarkable on venous phase assessment. Mediastinum/Nodes: Post esophagectomy with gastric pull-through. Patulous appearance of the pull-through which is filled partially with oral contrast media. No adenopathy in the chest. Lungs/Pleura: Numerous areas of airspace disease with nodular features and patchy areas of ground-glass. This is worse than on the study of May 21, 2020 now with involvement in the LEFT chest as well as the RIGHT and with more extensive involvement in the RIGHT chest when compared to the prior study. There is also slight interval enlargement of a very small RIGHT pleural effusion and interval development of a LEFT pleural effusion since the prior study. Musculoskeletal: See below for full musculoskeletal details. CT ABDOMEN PELVIS FINDINGS Hepatobiliary: No focal, suspicious hepatic lesion. The portal vein is patent. Gallbladder is decompressed. No biliary duct dilation. Pancreas: Soft tissue along the posterior pancreas may have diminished in size but obliterates the normal fat about the celiac axis similar to distribution seen on prior study but again perhaps diminished in size. No intrapancreatic lesion or signs of inflammation. Spleen: Normal Adrenals/Urinary Tract: Normal adrenal glands. Symmetric renal enhancement. No hydronephrosis. The LEFT renal pelvic calculus has migrated  slightly further into the renal pelvis measuring approximately 8 mm. Increasing stone burden in the LEFT kidney since previous imaging with another  calculus in the interpolar calyx and infundibulum approximately 5 mm. Distal ureters are difficult to assess. Urinary bladder is under distended. Stomach/Bowel: Soft tissue about the hepatic gastric recess measuring approximately 3.4 x 2.1 cm in axial dimension. Less bulky appearance along the LEFT periaortic region where it previously measured approximately 2.3 cm in greatest thickness and now measures 1.3 cm. Greatest maximal dimension of soft tissue on the prior study was approximately 3.9 by 2.6 cm. No sign of small bowel obstruction. The ingested contrast material passes through the small bowel with early opacification of the cecum which is present in the pelvis. Stool filled colon with signs of colonic diverticulosis. Ascending colonic thickening colonic thickening. Vascular/Lymphatic: Calcified atheromatous plaque in the abdominal aorta. Aneurysmal dilation of the infrarenal abdominal aorta approximately 2.9 cm greatest caliber similar to the prior study accounting for tortuosity in the axial plane. Reproductive: Prostate heterogeneous on CT with nonspecific appearance. Other: Diffuse loss of subcutaneous fat and mesenteric fat since the prior imaging evaluation makes assessment more difficult. Appendix not visualized without overt signs of acute appendicitis. Musculoskeletal: No acute musculoskeletal process. Spinal degenerative changes. IMPRESSION: 1. Findings of bilateral pneumonia with worsening compared to previous imaging. The possibility of background pulmonary metastatic disease is also considered based on previous PET imaging. There is rapid worsening in some areas, particularly the RIGHT upper lobe and LEFT lung base when compared to the previous more recent PET scan which would support a worsening overlay of infection perhaps from aspiration. 2.  Ascending colonic thickening may be related to colitis, correlate with any risk factors for infectious colitis in the setting of recent antibiotic administration. 3. Post esophagectomy with gastric pull-through. Patulous appearance of the pull-through which is filled partially with oral contrast media further supporting the possibility of aspiration in the setting of repeated nausea and vomiting. 4. Decreased soft tissue is suggested in the upper abdomen with limited assessment due to cachexia and early venous phase on the current study. 5. filling of the cecum with ingested contrast material, cecum now in the pelvis. Appendix not visualized. No secondary signs to suggest acute appendicitis. Assessment is however limited due to lack of intra-abdominal fat. If there is continued pain or symptoms that localized to the pelvis, repeat scanning could be considered after passage of oral contrast further into the colon. 6. Increased stone burden in the LEFT kidney. 7. Aneurysmal dilation of the infrarenal abdominal aorta approximately 2.9 cm greatest caliber similar to the prior study accounting for tortuosity in the axial plane. 8. Cachexia. Aortic Atherosclerosis (ICD10-I70.0). Electronically Signed   By: Zetta Bills M.D.   On: 08/09/2020 19:09   DG Abdomen Acute W/Chest  Result Date: 08/09/2020 CLINICAL DATA:  Vomiting for 10 days EXAM: DG ABDOMEN ACUTE WITH 1 VIEW CHEST COMPARISON:  04/01/2019, 04/22/2019 FINDINGS: There is no evidence of dilated bowel loops or free intraperitoneal air. 8 mm rounded calcification projects in the region of the left renal shadow. Tortuous abdominal aorta with atherosclerotic calcifications. Stable right-sided chest port. Cardiomediastinal contours are within normal limits. Worsening bilateral airspace opacities, most confluent within the right upper lobe and left lower lobe. No large pleural fluid collection. No pneumothorax. IMPRESSION: 1. Worsening multifocal pneumonia. 2.  Nonobstructive bowel gas pattern. 3. 8 mm calcification projects in the region of the left renal shadow. Electronically Signed   By: Davina Poke D.O.   On: 08/09/2020 16:28    Scheduled Meds:  aspirin EC  81 mg Oral Daily   atorvastatin  40 mg Oral  Daily   Chlorhexidine Gluconate Cloth  6 each Topical Daily   clopidogrel  75 mg Oral Daily   enoxaparin (LOVENOX) injection  40 mg Subcutaneous Q24H   feeding supplement  237 mL Oral BID BM   levothyroxine  50 mcg Oral Daily   metoprolol succinate  12.5 mg Oral Daily   multivitamin with minerals  1 tablet Oral Daily   pantoprazole  20 mg Oral Daily   Continuous Infusions:  sodium chloride     ampicillin-sulbactam (UNASYN) IV 3 g (08/11/20 1150)   erythromycin 250 mg (08/11/20 0756)     LOS: 2 days   Time spent: 40 minutes   Darliss Cheney, MD [facility]  08/11/2020, 1:04 PM   To contact the attending provider between 7A-7P or the covering provider during after hours 7P-7A, please log into the web site [email or web address].

## 2020-08-12 DIAGNOSIS — Z515 Encounter for palliative care: Secondary | ICD-10-CM | POA: Diagnosis not present

## 2020-08-12 DIAGNOSIS — J690 Pneumonitis due to inhalation of food and vomit: Secondary | ICD-10-CM | POA: Diagnosis not present

## 2020-08-12 LAB — IRON AND TIBC
Iron: 10 ug/dL — ABNORMAL LOW (ref 45–182)
Saturation Ratios: 8 % — ABNORMAL LOW (ref 17.9–39.5)
TIBC: 127 ug/dL — ABNORMAL LOW (ref 250–450)
UIBC: 117 ug/dL

## 2020-08-12 LAB — ABO/RH: ABO/RH(D): O POS

## 2020-08-12 LAB — COMPREHENSIVE METABOLIC PANEL
ALT: 20 U/L (ref 0–44)
AST: 31 U/L (ref 15–41)
Albumin: 1.7 g/dL — ABNORMAL LOW (ref 3.5–5.0)
Alkaline Phosphatase: 64 U/L (ref 38–126)
Anion gap: 6 (ref 5–15)
BUN: 25 mg/dL — ABNORMAL HIGH (ref 8–23)
CO2: 26 mmol/L (ref 22–32)
Calcium: 7.7 mg/dL — ABNORMAL LOW (ref 8.9–10.3)
Chloride: 104 mmol/L (ref 98–111)
Creatinine, Ser: 1.05 mg/dL (ref 0.61–1.24)
GFR, Estimated: >60 mL/min (ref >60)
Glucose, Bld: 89 mg/dL (ref 70–99)
Potassium: 3.4 mmol/L — ABNORMAL LOW (ref 3.5–5.1)
Sodium: 136 mmol/L (ref 135–145)
Total Bilirubin: 0.5 mg/dL (ref 0.3–1.2)
Total Protein: 6.4 g/dL — ABNORMAL LOW (ref 6.5–8.1)

## 2020-08-12 LAB — FERRITIN: Ferritin: 253 ng/mL (ref 24–336)

## 2020-08-12 LAB — CBC WITH DIFFERENTIAL/PLATELET
Abs Immature Granulocytes: 0.07 10*3/uL (ref 0.00–0.07)
Basophils Absolute: 0 10*3/uL (ref 0.0–0.1)
Basophils Relative: 0 %
Eosinophils Absolute: 0.1 10*3/uL (ref 0.0–0.5)
Eosinophils Relative: 1 %
HCT: 23.9 % — ABNORMAL LOW (ref 39.0–52.0)
Hemoglobin: 7.1 g/dL — ABNORMAL LOW (ref 13.0–17.0)
Immature Granulocytes: 1 %
Lymphocytes Relative: 5 %
Lymphs Abs: 0.4 10*3/uL — ABNORMAL LOW (ref 0.7–4.0)
MCH: 26.8 pg (ref 26.0–34.0)
MCHC: 29.7 g/dL — ABNORMAL LOW (ref 30.0–36.0)
MCV: 90.2 fL (ref 80.0–100.0)
Monocytes Absolute: 0.6 10*3/uL (ref 0.1–1.0)
Monocytes Relative: 7 %
Neutro Abs: 8.1 10*3/uL — ABNORMAL HIGH (ref 1.7–7.7)
Neutrophils Relative %: 86 %
Platelets: 113 10*3/uL — ABNORMAL LOW (ref 150–400)
RBC: 2.65 MIL/uL — ABNORMAL LOW (ref 4.22–5.81)
RDW: 18.6 % — ABNORMAL HIGH (ref 11.5–15.5)
WBC: 9.3 10*3/uL (ref 4.0–10.5)
nRBC: 0 % (ref 0.0–0.2)

## 2020-08-12 LAB — RETIC PANEL
Immature Retic Fract: 9.8 % (ref 2.3–15.9)
RBC.: 2.65 MIL/uL — ABNORMAL LOW (ref 4.22–5.81)
Retic Count, Absolute: 16.7 10*3/uL — ABNORMAL LOW (ref 19.0–186.0)
Retic Ct Pct: 0.6 % (ref 0.4–3.1)
Reticulocyte Hemoglobin: 24.5 pg — ABNORMAL LOW (ref >27.9)

## 2020-08-12 LAB — HEMOGLOBIN AND HEMATOCRIT, BLOOD
HCT: 22.1 % — ABNORMAL LOW (ref 39.0–52.0)
Hemoglobin: 6.8 g/dL — ABNORMAL LOW (ref 13.0–17.0)

## 2020-08-12 LAB — TRANSFERRIN: Transferrin: 95 mg/dL — ABNORMAL LOW (ref 180–329)

## 2020-08-12 LAB — PREPARE RBC (CROSSMATCH)

## 2020-08-12 LAB — VITAMIN B12: Vitamin B-12: 305 pg/mL (ref 180–914)

## 2020-08-12 LAB — FOLATE: Folate: 12.6 ng/mL (ref >5.9)

## 2020-08-12 MED ORDER — POTASSIUM CHLORIDE 10 MEQ/100ML IV SOLN
10.0000 meq | INTRAVENOUS | Status: AC
  Administered 2020-08-12 (×3): 10 meq via INTRAVENOUS
  Filled 2020-08-12 (×3): qty 100

## 2020-08-12 MED ORDER — SODIUM CHLORIDE 0.9% IV SOLUTION: Freq: Once | INTRAVENOUS | Status: AC

## 2020-08-12 NOTE — Progress Notes (Signed)
PT Cancellation Note  Patient Details Name: Corey Perry MRN: [id number] DOB: Aug 25, 1946   Cancelled Treatment:    Reason Eval/Treat Not Completed: Patient not medically ready.  PT consult received.  Chart reviewed.  Pt's Hgb noted to be down-trending to 6.8 today and per chart pt pending blood transfusion.  Per PT guidelines for low Hgb, will hold PT at this time and re-attempt PT evaluation at a later date/time as medically appropriate.  Leitha Bleak, PT 08/12/20, 1:52 PM

## 2020-08-12 NOTE — Progress Notes (Addendum)
PROGRESS NOTE    Corey Perry  YSA:[id number] DOB: 02-Jul-1946 DOA: 08/09/2020 PCP: Valerie Roys, DO   Brief Narrative:  Corey Perry is a 74 y.o. male with medical history significant for  multiple medical problems came to ER due to nausea vomiting and problem swallowing.  No other complaint. He endorses baseline shortness of breath that was unchanged.  He reports that the symptoms are baseline for him.    Has a history of esophageal dilatation procedure on 08/03/2020, he does feel better than prior to the procedure.  Lives with spouse. 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 He was a former tobacco user (started smoking at age 47, quit 2005), 4 stents at [facility] in 2005 and one stent in 2017. Endorses infrequent ETOH use (1/2 glass of whiskey per week).   Patient was admitted under hospitalist service due to multiple problems including nausea, vomiting, dehydration and acute on chronic hypoxic respiratory failure secondary to aspiration pneumonia.  Assessment & Plan:   Active Problems:   Vomiting   Dehydration   Aspiration pneumonia of both lungs (HCC)   Weight loss, non-intentional   Goals of care, counseling/discussion   Palliative care by specialist   DNR (do not resuscitate)  Vomiting / dehydration/gastroparesis: Patient has a history of adenocarcinoma lower third of esophagus with history of esophagectomy on October 07, 2016.  GI was consulted.  Per GI, patient symptoms are consistent with gastroparesis.  He has been started on erythromycin lately.  No further intervention planned or recommended by GI.  He is feeling better today since he was started on erythromycin yesterday.  Continue symptoms.  Per RN, patient was noted to be choking with foods today.  Will consult SLP to assess him.  Acute on chronic hypoxic respiratory failure secondary to aspiration pneumonia: Per his wife, patient has been taking Augmentin for about 3 weeks.  CT  chest abdomen and pelvis once again shows findings of bilateral pneumonia, worsening compared to previous imaging.  Possibility of background pulmonary metastasis is also considered based on previous PET scans. Patient is feeling better today.  Now back to his baseline of 2 L of oxygen without having any dyspnea.  Continue Unasyn.  History of CAD-, no symptoms.  Continue aspirin and Plavix  History of A. fib-continue metoprolol succinate 12.5 daily  Hyperlipidemia-continue 40 mg atorvastatin nightly  Hypothyroid-continue levothyroxine 50 mcg in the morning  Possible ascending colitis: CT abdomen pelvis indicates ascending colitis.  Per patient, he had diarrhea before but not anymore.  He had soft bowel movement today.  C. difficile negative.   Hypotension: Finally patient blood pressure is now at his baseline.  Will stop IV fluids now.  Goal of care: Seen by palliative care.  He sees palliative care as outpatient.  Not ready for hospice.  He is already DNR.  Acute on anemia of chronic disease: Patient's hemoglobin has been trending down since last few days.  Iron studies so far indicate anemia of chronic disease.  Hemoglobin 6.8 this afternoon.  Will transfuse 1 unit PRBC.  Checking FOBT.  If positive, will consult GI.  Hypokalemia: 3.4.  Replace.  Recheck in the morning.  DVT prophylaxis: enoxaparin (LOVENOX) injection 40 mg Start: 08/10/20 1000 Place TED hose Start: 08/09/20 2207   Code Status: DNR  Family Communication: None present at bedside.  Plan of care discussed with patient.  Updated his wife over the phone.  Status is: Inpatient  Remains inpatient appropriate because:Hemodynamically unstable   Dispo:  The patient is from: Home              Anticipated d/c is to: Home              Anticipated d/c date is: 1 day              Patient currently is not medically stable to d/c.        Estimated body mass index is 17.43 kg/m as calculated from the following:   Height  as of this encounter: 5\' 9"  (1.753 m).   Weight as of this encounter: 53.5 kg.      Nutritional status:               Consultants:   GI  Procedures:   None  Antimicrobials:  Anti-infectives (From admission, onward)   Start     Dose/Rate Route Frequency Ordered Stop   08/10/20 1600  erythromycin 250 mg in sodium chloride 0.9 % 100 mL IVPB        250 mg 100 mL/hr over 60 Minutes Intravenous Every 8 hours 08/10/20 1500     08/10/20 1600  Ampicillin-Sulbactam (UNASYN) 3 g in sodium chloride 0.9 % 100 mL IVPB        3 g 200 mL/hr over 30 Minutes Intravenous Every 6 hours 08/10/20 1514     08/09/20 2200  azithromycin (ZITHROMAX) 500 mg in sodium chloride 0.9 % 250 mL IVPB  Status:  Discontinued        500 mg 250 mL/hr over 60 Minutes Intravenous Every 24 hours 08/09/20 2050 08/09/20 2135   08/09/20 2200  cefTRIAXone (ROCEPHIN) 1 g in sodium chloride 0.9 % 100 mL IVPB  Status:  Discontinued        1 g 200 mL/hr over 30 Minutes Intravenous  Once 08/09/20 2050 08/09/20 2135   08/09/20 2200  piperacillin-tazobactam (ZOSYN) IVPB 3.375 g  Status:  Discontinued        3.375 g 12.5 mL/hr over 240 Minutes Intravenous Every 8 hours 08/09/20 2135 08/10/20 1506         Subjective: Seen and examined.  He has no complaints.  He feels better.  Objective: Vitals:   08/11/20 2114 08/11/20 2300 08/12/20 0750 08/12/20 1256  BP: 105/60 115/71 118/66 116/79  Pulse: 72 76 65 64  Resp:  18 20 20   Temp:  98.4 F (36.9 C) 98.7 F (37.1 C) 98.5 F (36.9 C)  TempSrc:  Oral Oral Oral  SpO2: 94% 95% 94% 95%  Weight:      Height:        Intake/Output Summary (Last 24 hours) at 08/12/2020 1328 Last data filed at 08/12/2020 0958 Gross per 24 hour  Intake 1850.67 ml  Output 50 ml  Net 1800.67 ml   Filed Weights   08/09/20 1338  Weight: 53.5 kg    Examination:  General exam: Appears calm and comfortable  Respiratory system: Bilateral scattered rhonchi. Respiratory effort  normal. Cardiovascular system: S1 & S2 heard, RRR. No JVD, murmurs, rubs, gallops or clicks. No pedal edema. Gastrointestinal system: Abdomen is nondistended, soft and nontender. No organomegaly or masses felt. Normal bowel sounds heard. Central nervous system: Alert and oriented. No focal neurological deficits. Extremities: Symmetric 5 x 5 power. Skin: No rashes, lesions or ulcers.  Psychiatry: Judgement and insight appear poor. Mood & affect appropriate.    Data Reviewed: I have personally reviewed following labs and imaging studies  CBC: Recent Labs  Lab 08/09/20 1341 08/10/20 0609 08/11/20 0500  08/12/20 0344 08/12/20 1135  WBC 12.1* 7.4 11.8* 9.3  --   NEUTROABS  --   --  10.6* 8.1*  --   HGB 9.4* 8.5* 7.4* 7.1* 6.8*  HCT 31.5* 28.0* 24.1* 23.9* 22.1*  MCV 91.0 90.3 88.9 90.2  --   PLT 180 136* 129* 113*  --    Basic Metabolic Panel: Recent Labs  Lab 08/09/20 1341 08/10/20 0609 08/11/20 0500 08/12/20 0344  NA 137 134* 138 136  K 4.5 4.1 3.8 3.4*  CL 97* 97* 101 104  CO2 32 29 28 26   GLUCOSE 134* 75 144* 89  BUN 34* 32* 29* 25*  CREATININE 1.46* 1.30* 1.34* 1.05  CALCIUM 8.7* 8.2* 7.8* 7.7*  MG  --   --  2.0  --    GFR: Estimated Creatinine Clearance: 47.4 mL/min (by C-G formula based on SCr of 1.05 mg/dL). Liver Function Tests: Recent Labs  Lab 08/09/20 1341 08/11/20 0500 08/12/20 0344  AST 32 30 31  ALT 25 20 20   ALKPHOS 68 58 64  BILITOT 0.7 0.3 0.5  PROT 8.1 6.8 6.4*  ALBUMIN 2.2* 1.9* 1.7*   Recent Labs  Lab 08/09/20 1341  LIPASE 28   No results for input(s): AMMONIA in the last 168 hours. Coagulation Profile: No results for input(s): INR, PROTIME in the last 168 hours. Cardiac Enzymes: No results for input(s): CKTOTAL, CKMB, CKMBINDEX, TROPONINI in the last 168 hours. BNP (last 3 results) No results for input(s): PROBNP in the last 8760 hours. HbA1C: No results for input(s): HGBA1C in the last 72 hours. CBG: No results for input(s):  GLUCAP in the last 168 hours. Lipid Profile: No results for input(s): CHOL, HDL, LDLCALC, TRIG, CHOLHDL, LDLDIRECT in the last 72 hours. Thyroid Function Tests: No results for input(s): TSH, T4TOTAL, FREET4, T3FREE, THYROIDAB in the last 72 hours. Anemia Panel: Recent Labs    08/12/20 0605  FOLATE 12.6  FERRITIN 253  TIBC 127*  IRON 10*  RETICCTPCT 0.6   Sepsis Labs: Recent Labs  Lab 08/09/20 1723 08/10/20 1532 08/10/20 1823 08/11/20 1333 08/11/20 1413  PROCALCITON 0.20  --   --   --   --   LATICACIDVEN  --  1.8 1.9 1.6 1.4    Recent Results (from the past 240 hour(s))  Resp Panel by RT-PCR (Flu A&B, Covid) Nasopharyngeal Swab     Status: None   Collection Time: 08/09/20  5:23 PM   Specimen: Nasopharyngeal Swab; Nasopharyngeal(NP) swabs in vial transport medium  Result Value Ref Range Status   SARS Coronavirus 2 by RT PCR NEGATIVE NEGATIVE Final    Comment: (NOTE) SARS-CoV-2 target nucleic acids are NOT DETECTED.  The SARS-CoV-2 RNA is generally detectable in upper respiratory specimens during the acute phase of infection. The lowest concentration of SARS-CoV-2 viral copies this assay can detect is 138 copies/mL. A negative result does not preclude SARS-Cov-2 infection and should not be used as the sole basis for treatment or other patient management decisions. A negative result may occur with  improper specimen collection/handling, submission of specimen other than nasopharyngeal swab, presence of viral mutation(s) within the areas targeted by this assay, and inadequate number of viral copies(<138 copies/mL). A negative result must be combined with clinical observations, patient history, and epidemiological information. The expected result is Negative.  Fact Sheet for Patients:  EntrepreneurPulse.com.au  Fact Sheet for Healthcare Providers:  IncredibleEmployment.be  This test is no t yet approved or cleared by the [place] FDA and  has  been authorized for detection and/or diagnosis of SARS-CoV-2 by FDA under an Emergency Use Authorization (EUA). This EUA will remain  in effect (meaning this test can be used) for the duration of the COVID-19 declaration under Section 564(b)(1) of the Act, 21 U.S.C.section 360bbb-3(b)(1), unless the authorization is terminated  or revoked sooner.       Influenza A by PCR NEGATIVE NEGATIVE Final   Influenza B by PCR NEGATIVE NEGATIVE Final    Comment: (NOTE) The Xpert Xpress SARS-CoV-2/FLU/RSV plus assay is intended as an aid in the diagnosis of influenza from Nasopharyngeal swab specimens and should not be used as a sole basis for treatment. Nasal washings and aspirates are unacceptable for Xpert Xpress SARS-CoV-2/FLU/RSV testing.  Fact Sheet for Patients: EntrepreneurPulse.com.au  Fact Sheet for Healthcare Providers: IncredibleEmployment.be  This test is not yet approved or cleared by the [place] FDA and has been authorized for detection and/or diagnosis of SARS-CoV-2 by FDA under an Emergency Use Authorization (EUA). This EUA will remain in effect (meaning this test can be used) for the duration of the COVID-19 declaration under Section 564(b)(1) of the Act, 21 U.S.C. section 360bbb-3(b)(1), unless the authorization is terminated or revoked.  Performed at [facility], [address] [place], [place] [address]   C Difficile Quick Screen w PCR reflex     Status: None   Collection Time: 08/10/20 10:42 AM   Specimen: STOOL  Result Value Ref Range Status   C Diff antigen NEGATIVE NEGATIVE Final   C Diff toxin NEGATIVE NEGATIVE Final   C Diff interpretation No C. difficile detected.  Final    Comment: Performed at [facility], [address] [place], [place] [address]      Radiology Studies: No results found.  Scheduled Meds:  sodium chloride   Intravenous Once   aspirin EC  81 mg  Oral Daily   atorvastatin  40 mg Oral Daily   Chlorhexidine Gluconate Cloth  6 each Topical Daily   clopidogrel  75 mg Oral Daily   enoxaparin (LOVENOX) injection  40 mg Subcutaneous Q24H   feeding supplement  237 mL Oral BID BM   levothyroxine  50 mcg Oral Daily   metoprolol succinate  12.5 mg Oral Daily   multivitamin with minerals  1 tablet Oral Daily   pantoprazole  20 mg Oral Daily   Continuous Infusions:  sodium chloride 75 mL/hr at 08/12/20 0634   ampicillin-sulbactam (UNASYN) IV 3 g (08/12/20 1319)   erythromycin 250 mg (08/12/20 0742)     LOS: 3 days   Time spent: 30 minutes   Darliss Cheney, MD [facility]  08/12/2020, 1:28 PM   To contact the attending provider between 7A-7P or the covering provider during after hours 7P-7A, please log into the web site [email or web address].

## 2020-08-13 ENCOUNTER — Encounter: Payer: Self-pay | Admitting: Internal Medicine

## 2020-08-13 ENCOUNTER — Inpatient Hospital Stay: Payer: Medicare Other

## 2020-08-13 DIAGNOSIS — J690 Pneumonitis due to inhalation of food and vomit: Secondary | ICD-10-CM | POA: Diagnosis not present

## 2020-08-13 LAB — TYPE AND SCREEN
ABO/RH(D): O POS
Antibody Screen: NEGATIVE
Unit division: 0

## 2020-08-13 LAB — CBC WITH DIFFERENTIAL/PLATELET
Abs Immature Granulocytes: 0.12 10*3/uL — ABNORMAL HIGH (ref 0.00–0.07)
Basophils Absolute: 0 10*3/uL (ref 0.0–0.1)
Basophils Relative: 0 %
Eosinophils Absolute: 0.2 10*3/uL (ref 0.0–0.5)
Eosinophils Relative: 2 %
HCT: 28.8 % — ABNORMAL LOW (ref 39.0–52.0)
Hemoglobin: 9 g/dL — ABNORMAL LOW (ref 13.0–17.0)
Immature Granulocytes: 1 %
Lymphocytes Relative: 6 %
Lymphs Abs: 0.6 10*3/uL — ABNORMAL LOW (ref 0.7–4.0)
MCH: 27.4 pg (ref 26.0–34.0)
MCHC: 31.3 g/dL (ref 30.0–36.0)
MCV: 87.8 fL (ref 80.0–100.0)
Monocytes Absolute: 0.7 10*3/uL (ref 0.1–1.0)
Monocytes Relative: 7 %
Neutro Abs: 8.4 10*3/uL — ABNORMAL HIGH (ref 1.7–7.7)
Neutrophils Relative %: 84 %
Platelets: 148 10*3/uL — ABNORMAL LOW (ref 150–400)
RBC: 3.28 MIL/uL — ABNORMAL LOW (ref 4.22–5.81)
RDW: 18.1 % — ABNORMAL HIGH (ref 11.5–15.5)
WBC: 9.9 10*3/uL (ref 4.0–10.5)
nRBC: 0 % (ref 0.0–0.2)

## 2020-08-13 LAB — BASIC METABOLIC PANEL
Anion gap: 6 (ref 5–15)
BUN: 19 mg/dL (ref 8–23)
CO2: 26 mmol/L (ref 22–32)
Calcium: 7.7 mg/dL — ABNORMAL LOW (ref 8.9–10.3)
Chloride: 103 mmol/L (ref 98–111)
Creatinine, Ser: 1.13 mg/dL (ref 0.61–1.24)
GFR, Estimated: >60 mL/min (ref >60)
Glucose, Bld: 86 mg/dL (ref 70–99)
Potassium: 3.5 mmol/L (ref 3.5–5.1)
Sodium: 135 mmol/L (ref 135–145)

## 2020-08-13 LAB — BPAM RBC
Blood Product Expiration Date: 202112302359
ISSUE DATE / TIME: 202111271603
Unit Type and Rh: 5100

## 2020-08-13 LAB — OCCULT BLOOD X 1 CARD TO LAB, STOOL: Fecal Occult Bld: NEGATIVE

## 2020-08-13 LAB — CBC
HCT: 24.5 % — ABNORMAL LOW (ref 39.0–52.0)
Hemoglobin: 7.6 g/dL — ABNORMAL LOW (ref 13.0–17.0)
MCH: 27.3 pg (ref 26.0–34.0)
MCHC: 31 g/dL (ref 30.0–36.0)
MCV: 88.1 fL (ref 80.0–100.0)
Platelets: 121 10*3/uL — ABNORMAL LOW (ref 150–400)
RBC: 2.78 MIL/uL — ABNORMAL LOW (ref 4.22–5.81)
RDW: 18 % — ABNORMAL HIGH (ref 11.5–15.5)
WBC: 5.6 10*3/uL (ref 4.0–10.5)
nRBC: 0 % (ref 0.0–0.2)

## 2020-08-13 MED ORDER — IOHEXOL 350 MG/ML SOLN
75.0000 mL | Freq: Once | INTRAVENOUS | Status: AC | PRN
  Administered 2020-08-13: 75 mL via INTRAVENOUS

## 2020-08-13 NOTE — Progress Notes (Addendum)
PROGRESS NOTE    Corey Perry  ZOX:[id number] DOB: 01-09-1946 DOA: 08/09/2020 PCP: Valerie Roys, DO   Brief Narrative:  Corey Perry is a 74 y.o. male with medical history significant for  multiple medical problems came to ER due to nausea vomiting and problem swallowing.  No other complaint. He endorses baseline shortness of breath that was unchanged.  He reports that the symptoms are baseline for him.    Has a history of esophageal dilatation procedure on 08/03/2020, he does feel better than prior to the procedure.  Lives with spouse. 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 He was a former tobacco user (started smoking at age 21, quit 2005), 4 stents at [facility] in 2005 and one stent in 2017. Endorses infrequent ETOH use (1/2 glass of whiskey per week).   Patient was admitted under hospitalist service due to multiple problems including nausea, vomiting, dehydration and acute on chronic hypoxic respiratory failure secondary to aspiration pneumonia.  Assessment & Plan:   Active Problems:   Vomiting   Dehydration   Aspiration pneumonia of both lungs (HCC)   Weight loss, non-intentional   Goals of care, counseling/discussion   Palliative care by specialist   DNR (do not resuscitate)  Vomiting / dehydration/gastroparesis: Patient has a history of adenocarcinoma lower third of esophagus with history of esophagectomy on October 07, 2016.  GI was consulted.  Per GI, patient symptoms are consistent with gastroparesis.  He has been started on erythromycin lately.  No further intervention planned or recommended by GI.  He is feeling better today since he was started on erythromycin yesterday.  There was some concern of possible choking and having dysphagia yesterday by nursing staff.  SLP was consulted.  Acute on chronic hypoxic respiratory failure secondary to aspiration pneumonia: Per his wife, patient has been taking Augmentin for about 3 weeks.  CT  chest abdomen and pelvis once again shows findings of bilateral pneumonia, worsening compared to previous imaging.  Possibility of background pulmonary metastasis is also considered based on previous PET scans. Patient is feeling better today.  Now back to his baseline of 2 L of oxygen without having any dyspnea.  Continue Unasyn.  History of CAD-, no symptoms.  Continue aspirin and Plavix  History of A. fib-continue metoprolol succinate 12.5 daily  Hyperlipidemia-continue 40 mg atorvastatin nightly  Hypothyroid-continue levothyroxine 50 mcg in the morning  Possible ascending colitis: CT abdomen pelvis indicates ascending colitis.  Per patient, he had diarrhea before but not anymore.  He had soft bowel movement today.  C. difficile negative.   Hypotension: Blood pressure better and at his baseline.  Goal of care: Seen by palliative care.  He sees palliative care as outpatient.  Not ready for hospice.  He is already DNR.  Acute on anemia of chronic disease: Patient's hemoglobin continued to trend down and dropped to 6.8 on 08/12/2020.  He was transfused 1 unit of PRBC.  Iron studies indicate anemia of chronic disease.  FOBT has been ordered yesterday and per reports, patient has had a bowel movement as well but nursing staff did not collect the sample.  I had personally talked and instructed the nurses to make sure to collect sample yesterday and I did talk to the nurse again today.  Hemoglobin over 9 today after transfusion.  Monitor later today and tomorrow morning.  Hypokalemia: Resolved.  Hemoptysis/epistaxis: Per reports, patient has had intermittent epistaxis yesterday since he received blood transfusion and this morning, he had hemoptysis.  No hematemesis.  Hemoptysis was observed by physical therapist.  Patient also complains of slightly worsening shortness of breath.  Although he has been receiving DVT prophylaxis but with his symptoms, I am concerned about possible PE and thus I will  proceed with stat CT chest angiogram to rule out PE.  Patient is requiring 2 L of oxygen which is his baseline.   DVT prophylaxis: enoxaparin (LOVENOX) injection 40 mg Start: 08/10/20 1000 Place TED hose Start: 08/09/20 2207   Code Status: DNR  Family Communication: None present at bedside.  Plan of care discussed with patient.  Updated his wife over the phone.  Status is: Inpatient  Remains inpatient appropriate because:Hemodynamically unstable   Dispo: The patient is from: Home              Anticipated d/c is to: Home              Anticipated d/c date is: 1 day              Patient currently is not medically stable to d/c.        Estimated body mass index is 17.43 kg/m as calculated from the following:   Height as of this encounter: 5\' 9"  (1.753 m).   Weight as of this encounter: 53.5 kg.      Nutritional status:               Consultants:   GI  Procedures:   None  Antimicrobials:  Anti-infectives (From admission, onward)   Start     Dose/Rate Route Frequency Ordered Stop   08/10/20 1600  erythromycin 250 mg in sodium chloride 0.9 % 100 mL IVPB        250 mg 100 mL/hr over 60 Minutes Intravenous Every 8 hours 08/10/20 1500     08/10/20 1600  Ampicillin-Sulbactam (UNASYN) 3 g in sodium chloride 0.9 % 100 mL IVPB        3 g 200 mL/hr over 30 Minutes Intravenous Every 6 hours 08/10/20 1514     08/09/20 2200  azithromycin (ZITHROMAX) 500 mg in sodium chloride 0.9 % 250 mL IVPB  Status:  Discontinued        500 mg 250 mL/hr over 60 Minutes Intravenous Every 24 hours 08/09/20 2050 08/09/20 2135   08/09/20 2200  cefTRIAXone (ROCEPHIN) 1 g in sodium chloride 0.9 % 100 mL IVPB  Status:  Discontinued        1 g 200 mL/hr over 30 Minutes Intravenous  Once 08/09/20 2050 08/09/20 2135   08/09/20 2200  piperacillin-tazobactam (ZOSYN) IVPB 3.375 g  Status:  Discontinued        3.375 g 12.5 mL/hr over 240 Minutes Intravenous Every 8 hours 08/09/20 2135 08/10/20  1506         Subjective: Seen and examined.  Patient tells me that he is not feeling well.  He is feeling more short of breath and has had hemoptysis this morning.  Has been having some intermittent epistaxis as well but currently it has stopped.  Objective: Vitals:   08/12/20 1938 08/13/20 0053 08/13/20 0457 08/13/20 0812  BP: 127/77 140/76 128/78 129/76  Pulse: 68 65 64 62  Resp: 20 18 20 20   Temp: 98.5 F (36.9 C) 98.2 F (36.8 C) 98.9 F (37.2 C) 98.2 F (36.8 C)  TempSrc: Oral Oral    SpO2: 92% 90% 93% 94%  Weight:      Height:        Intake/Output Summary (Last  24 hours) at 08/13/2020 1057 Last data filed at 08/13/2020 0948 Gross per 24 hour  Intake 410 ml  Output 200 ml  Net 210 ml   Filed Weights   08/09/20 1338  Weight: 53.5 kg    Examination:  General exam: Appears calm and comfortable  Respiratory system: Bilateral scattered rhonchi, respiratory effort normal. Cardiovascular system: S1 & S2 heard, RRR. No JVD, murmurs, rubs, gallops or clicks. No pedal edema. Gastrointestinal system: Abdomen is nondistended, soft and nontender. No organomegaly or masses felt. Normal bowel sounds heard. Central nervous system: Alert and oriented. No focal neurological deficits. Extremities: Symmetric 5 x 5 power. Skin: No rashes, lesions or ulcers.  Psychiatry: Judgement and insight appear normal. Mood & affect appropriate.    Data Reviewed: I have personally reviewed following labs and imaging studies  CBC: Recent Labs  Lab 08/09/20 1341 08/09/20 1341 08/10/20 0609 08/11/20 0500 08/12/20 0344 08/12/20 1135 08/13/20 0545  WBC 12.1*  --  7.4 11.8* 9.3  --  9.9  NEUTROABS  --   --   --  10.6* 8.1*  --  8.4*  HGB 9.4*   < > 8.5* 7.4* 7.1* 6.8* 9.0*  HCT 31.5*   < > 28.0* 24.1* 23.9* 22.1* 28.8*  MCV 91.0  --  90.3 88.9 90.2  --  87.8  PLT 180  --  136* 129* 113*  --  148*   < > = values in this interval not displayed.   Basic Metabolic Panel: Recent Labs    Lab 08/09/20 1341 08/10/20 0609 08/11/20 0500 08/12/20 0344 08/13/20 0545  NA 137 134* 138 136 135  K 4.5 4.1 3.8 3.4* 3.5  CL 97* 97* 101 104 103  CO2 32 29 28 26 26   GLUCOSE 134* 75 144* 89 86  BUN 34* 32* 29* 25* 19  CREATININE 1.46* 1.30* 1.34* 1.05 1.13  CALCIUM 8.7* 8.2* 7.8* 7.7* 7.7*  MG  --   --  2.0  --   --    GFR: Estimated Creatinine Clearance: 44.1 mL/min (by C-G formula based on SCr of 1.13 mg/dL). Liver Function Tests: Recent Labs  Lab 08/09/20 1341 08/11/20 0500 08/12/20 0344  AST 32 30 31  ALT 25 20 20   ALKPHOS 68 58 64  BILITOT 0.7 0.3 0.5  PROT 8.1 6.8 6.4*  ALBUMIN 2.2* 1.9* 1.7*   Recent Labs  Lab 08/09/20 1341  LIPASE 28   No results for input(s): AMMONIA in the last 168 hours. Coagulation Profile: No results for input(s): INR, PROTIME in the last 168 hours. Cardiac Enzymes: No results for input(s): CKTOTAL, CKMB, CKMBINDEX, TROPONINI in the last 168 hours. BNP (last 3 results) No results for input(s): PROBNP in the last 8760 hours. HbA1C: No results for input(s): HGBA1C in the last 72 hours. CBG: No results for input(s): GLUCAP in the last 168 hours. Lipid Profile: No results for input(s): CHOL, HDL, LDLCALC, TRIG, CHOLHDL, LDLDIRECT in the last 72 hours. Thyroid Function Tests: No results for input(s): TSH, T4TOTAL, FREET4, T3FREE, THYROIDAB in the last 72 hours. Anemia Panel: Recent Labs    08/12/20 0605  VITAMINB12 305  FOLATE 12.6  FERRITIN 253  TIBC 127*  IRON 10*  RETICCTPCT 0.6   Sepsis Labs: Recent Labs  Lab 08/09/20 1723 08/10/20 1532 08/10/20 1823 08/11/20 1333 08/11/20 1413  PROCALCITON 0.20  --   --   --   --   LATICACIDVEN  --  1.8 1.9 1.6 1.4    Recent Results (from the  past 240 hour(s))  Resp Panel by RT-PCR (Flu A&B, Covid) Nasopharyngeal Swab     Status: None   Collection Time: 08/09/20  5:23 PM   Specimen: Nasopharyngeal Swab; Nasopharyngeal(NP) swabs in vial transport medium  Result Value Ref  Range Status   SARS Coronavirus 2 by RT PCR NEGATIVE NEGATIVE Final    Comment: (NOTE) SARS-CoV-2 target nucleic acids are NOT DETECTED.  The SARS-CoV-2 RNA is generally detectable in upper respiratory specimens during the acute phase of infection. The lowest concentration of SARS-CoV-2 viral copies this assay can detect is 138 copies/mL. A negative result does not preclude SARS-Cov-2 infection and should not be used as the sole basis for treatment or other patient management decisions. A negative result may occur with  improper specimen collection/handling, submission of specimen other than nasopharyngeal swab, presence of viral mutation(s) within the areas targeted by this assay, and inadequate number of viral copies(<138 copies/mL). A negative result must be combined with clinical observations, patient history, and epidemiological information. The expected result is Negative.  Fact Sheet for Patients:  EntrepreneurPulse.com.au  Fact Sheet for Healthcare Providers:  IncredibleEmployment.be  This test is no t yet approved or cleared by the [place] FDA and  has been authorized for detection and/or diagnosis of SARS-CoV-2 by FDA under an Emergency Use Authorization (EUA). This EUA will remain  in effect (meaning this test can be used) for the duration of the COVID-19 declaration under Section 564(b)(1) of the Act, 21 U.S.C.section 360bbb-3(b)(1), unless the authorization is terminated  or revoked sooner.       Influenza A by PCR NEGATIVE NEGATIVE Final   Influenza B by PCR NEGATIVE NEGATIVE Final    Comment: (NOTE) The Xpert Xpress SARS-CoV-2/FLU/RSV plus assay is intended as an aid in the diagnosis of influenza from Nasopharyngeal swab specimens and should not be used as a sole basis for treatment. Nasal washings and aspirates are unacceptable for Xpert Xpress SARS-CoV-2/FLU/RSV testing.  Fact Sheet for  Patients: EntrepreneurPulse.com.au  Fact Sheet for Healthcare Providers: IncredibleEmployment.be  This test is not yet approved or cleared by the [place] FDA and has been authorized for detection and/or diagnosis of SARS-CoV-2 by FDA under an Emergency Use Authorization (EUA). This EUA will remain in effect (meaning this test can be used) for the duration of the COVID-19 declaration under Section 564(b)(1) of the Act, 21 U.S.C. section 360bbb-3(b)(1), unless the authorization is terminated or revoked.  Performed at [facility], [address] [place], [place] [address]   C Difficile Quick Screen w PCR reflex     Status: None   Collection Time: 08/10/20 10:42 AM   Specimen: STOOL  Result Value Ref Range Status   C Diff antigen NEGATIVE NEGATIVE Final   C Diff toxin NEGATIVE NEGATIVE Final   C Diff interpretation No C. difficile detected.  Final    Comment: Performed at [facility], [address], [place], [place] [address]      Radiology Studies: No results found.  Scheduled Meds: . aspirin EC  81 mg Oral Daily  . atorvastatin  40 mg Oral Daily  . Chlorhexidine Gluconate Cloth  6 each Topical Daily  . clopidogrel  75 mg Oral Daily  . enoxaparin (LOVENOX) injection  40 mg Subcutaneous Q24H  . feeding supplement  237 mL Oral BID BM  . levothyroxine  50 mcg Oral Daily  . metoprolol succinate  12.5 mg Oral Daily  . multivitamin with minerals  1 tablet Oral Daily  . pantoprazole  20 mg Oral Daily   Continuous Infusions: . ampicillin-sulbactam (UNASYN) IV 3 g (08/13/20 1050)  . erythromycin 250 mg (08/13/20 0707)     LOS: 4 days   Time spent: 33 minutes   Darliss Cheney, MD [facility]  08/13/2020, 10:57 AM   To contact the attending provider between 7A-7P or the covering provider during after hours 7P-7A, please log into the web site [email or web address].   Addendum: CT angiogram negative for PE.  I  was informed by RN that patient had another episode of hemoptysis with large blood clot.  Based on this data available, I suspect that he is likely bleeding from his lower esophagus.  I have reconsulted GI and sent a message to Dr. Haig Prophet.  He may need EGD.

## 2020-08-13 NOTE — Progress Notes (Signed)
PT Cancellation Note  Patient Details Name: Corey Perry MRN: [id number] DOB: 05-Dec-1945   Cancelled Treatment:    Reason Eval/Treat Not Completed: Patient not medically ready.  PT consult received.  Chart reviewed.  Per chart pt had nose bleed with clots multiple times since blood transfusion yesterday: discussed this with pt's nurse who cleared pt for participation in physical therapy.  Pt resting in bed upon PT arrival.  Obtained PLOF and home set-up information.  Room set-up for OOB mobility.  Prior to getting OOB, pt started coughing and productive cough noted to be red in color.  D/t this, deferred OOB mobility (nurse notified immediately).  Will monitor pt's status and re-attempt PT evaluation as medically appropriate.  Leitha Bleak, PT 08/13/20, 9:28 AM

## 2020-08-13 NOTE — Progress Notes (Signed)
Patient had a nose bleed with clots. According to patient it happened yesterday morning after a blood transfusion, last night and this morning.Marland KitchenOn-call Provider Kasandra Knudsen NP notified via [place] / pager.

## 2020-08-13 NOTE — TOC Initial Note (Signed)
Transition of Care [facility]) - Initial/Assessment Note    Patient Details  Name: Corey Perry MRN: [id number] Date of Birth: [birth year]  Transition [facility]) CM/SW Contact:    Meriel Flavors, LCSW Phone Number: 08/13/2020, 9:53 AM  Clinical Narrative:                 Patient here for dehydration, vomiting, GI symptoms. Patient lives with wife, [place] Comment. She is primary caregiver, provides transportation, makes sure patient has medications. [facility] OP Palliative is actively following patient as well. Patient has refused all HH/PT and DME services previously, but CSW will offer again before discharge.   Expected Discharge Plan: Home/Self Care Barriers to Discharge: No Barriers Identified   Patient Goals and CMS Choice Patient states their goals for this hospitalization and ongoing recovery are:: To get better and make it back home   Choice offered to / list presented to : NA  Expected Discharge Plan and Services Expected Discharge Plan: Home/Self Care   Discharge Planning Services: Other - See comment (Patient has declined all services offered previously. CSW will attempt to offer again at discharge) Post Acute Care Choice: NA Living arrangements for the past 2 months: Single Family Home (Lives with wife [place], primary caregiver)                 DME Arranged: N/A DME Agency: NA       HH Arranged: Refused HH [place] Agency: NA        Prior Living Arrangements/Services Living arrangements for the past 2 months: Single Family Home (Lives with wife [place], primary caregiver) Lives with:: Spouse Patient language and need for interpreter reviewed:: No        Need for Family Participation in Patient Care: Yes (Comment) Care giver support system in place?: Yes (comment)   Criminal Activity/Legal Involvement Pertinent to Current Situation/Hospitalization: No - Comment as needed  Activities of Daily Living   ADL Screening (condition at time of admission) Independently  performs ADLs?: No Communication: Independent Dressing (OT): Needs assistance Is this a change from baseline?: Pre-admission baseline Grooming: Needs assistance Is this a change from baseline?: Pre-admission baseline Feeding: Independent Bathing: Needs assistance Is this a change from baseline?: Pre-admission baseline Toileting: Needs assistance Is this a change from baseline?: Pre-admission baseline In/Out Bed: Needs assistance Is this a change from baseline?: Pre-admission baseline Walks in Home: Independent with device (comment) Does the patient have difficulty walking or climbing stairs?: Yes Weakness of Legs: Both Weakness of Arms/Hands: Both  Permission Sought/Granted                  Emotional Assessment Appearance:: Appears older than stated age Attitude/Demeanor/Rapport: Guarded Affect (typically observed): Unable to Assess Orientation: : Oriented to Self Alcohol / Substance Use: Not Applicable Psych Involvement: No (comment)  Admission diagnosis:  Dehydration [E86.0] Vomiting [R11.10] Weight loss, non-intentional [R63.4] Persistent recurrent vomiting [R11.15] Aspiration pneumonia of both lungs, unspecified aspiration pneumonia type, unspecified part of lung ([place]) [J69.0] Patient Active Problem List   Diagnosis Date Noted  . Aspiration pneumonia of both lungs ([place])   . Weight loss, non-intentional   . Goals of care, counseling/discussion   . Palliative care by specialist   . DNR (do not resuscitate)   . Vomiting 08/09/2020  . Dehydration 08/09/2020  . Hypoxia 07/18/2020  . Severe sepsis with septic shock ([place]) 05/24/2020  . Septic shock ([place]) 05/24/2020  . Malnutrition of moderate degree 05/23/2020  . Aspiration pneumonia of right middle lobe ([place])  05/22/2020  . Sepsis ([place]) 05/22/2020  . Hypotension 05/22/2020  . CKD (chronic kidney disease) stage 3, GFR 30-59 ml/min (HCC) 05/22/2020  . Acute respiratory failure with hypoxia ([place]) 05/22/2020  .  Pneumonia of right middle lobe due to infectious organism 04/25/2020  . Cough 04/20/2020  . Diarrhea 11/10/2019  . Atrial fibrillation ([place])   . Senile purpura ([employer]) 08/27/2018  . Advanced care planning/counseling discussion 07/29/2017  . Recurrent inguinal hernia of right side without obstruction or gangrene 07/29/2017  . Kidney stones 02/02/2017  . Esophageal cancer ([place]) 06/23/2016  . Leukopenia 04/10/2016  . Thrombocytopenia ([place]) 04/10/2016  . Hypertensive kidney disease with CKD stage III ([place]) 06/22/2015  . CAD (coronary artery disease) 06/21/2015  . BPH (benign prostatic hyperplasia) 06/21/2015  . Acid reflux 06/21/2015  . Hypothyroid   . Hyperlipidemia    PCP:  Valerie Roys, DO Pharmacy:   [place], [name] [address] Phone: [phone] Fax: [phone]     Social Determinants of Health (SDOH) Interventions    Readmission Risk Interventions Readmission Risk Prevention Plan 08/13/2020  Transportation Screening Complete  PCP or Specialist Appt within 3-5 Days Complete  HRI or [place] Complete  Social Work Consult for [place] Planning/Counseling Complete  Palliative Care Screening Complete  Medication Review Press photographer) Complete  Some recent data might be hidden

## 2020-08-13 NOTE — Progress Notes (Signed)
GI Inpatient Follow-up Note  Subjective:  Called back by hospitalist due to coughing of blood versus vomiting blood. Patient states that after his recent blood transfusion he developed nose bleeds and hemoptysis. Personally witnessed coughing up about a quarter size piece of sputum mixed with blood which he says is consistent with what he has been doing since the blood transfusion. Patient fatigue.  Scheduled Inpatient Medications:  . aspirin EC  81 mg Oral Daily  . atorvastatin  40 mg Oral Daily  . Chlorhexidine Gluconate Cloth  6 each Topical Daily  . clopidogrel  75 mg Oral Daily  . enoxaparin (LOVENOX) injection  40 mg Subcutaneous Q24H  . feeding supplement  237 mL Oral BID BM  . levothyroxine  50 mcg Oral Daily  . metoprolol succinate  12.5 mg Oral Daily  . multivitamin with minerals  1 tablet Oral Daily  . pantoprazole  20 mg Oral Daily    Continuous Inpatient Infusions:   . ampicillin-sulbactam (UNASYN) IV 3 g (08/13/20 1050)  . erythromycin 250 mg (08/13/20 0707)    PRN Inpatient Medications:  acetaminophen **OR** acetaminophen, loperamide, morphine injection, nitroGLYCERIN, ondansetron **OR** ondansetron (ZOFRAN) IV, prochlorperazine  Review of Systems:  Review of Systems  Constitutional: Positive for malaise/fatigue and weight loss. Negative for chills and fever.  HENT: Positive for nosebleeds.   Respiratory: Positive for cough, hemoptysis and shortness of breath.   Cardiovascular: Negative for chest pain.  Gastrointestinal: Positive for vomiting. Negative for blood in stool and melena.  Neurological: Negative for focal weakness.  Psychiatric/Behavioral: Negative for substance abuse.  All other systems reviewed and are negative.    Physical Examination: BP 125/76 (BP Location: Right Arm)   Pulse 70   Temp 98.3 F (36.8 C)   Resp 20   Ht 5\' 9"  (1.753 m)   Wt 53.5 kg   SpO2 95%   BMI 17.43 kg/m  Gen: Cachetic gentleman HEENT: PEERLA, EOMI, Neck: supple,  no JVD or thyromegaly Chest: visibly short of breath Abd: soft, NT, ND, Ext: no edema, well perfused with 2+ pulses, Skin: no rash or lesions noted Lymph: no lymphadenopathy  Data: Lab Results  Component Value Date   WBC 9.9 08/13/2020   HGB 9.0 (L) 08/13/2020   HCT 28.8 (L) 08/13/2020   MCV 87.8 08/13/2020   PLT 148 (L) 08/13/2020   Recent Labs  Lab 08/12/20 0344 08/12/20 1135 08/13/20 0545  HGB 7.1* 6.8* 9.0*   Lab Results  Component Value Date   NA 135 08/13/2020   K 3.5 08/13/2020   CL 103 08/13/2020   CO2 26 08/13/2020   BUN 19 08/13/2020   CREATININE 1.13 08/13/2020   Lab Results  Component Value Date   ALT 20 08/12/2020   AST 31 08/12/2020   ALKPHOS 64 08/12/2020   BILITOT 0.5 08/12/2020   No results for input(s): APTT, INR, PTT in the last 168 hours. Assessment/Plan: Mr. Boen is a 74 y.o. gentleman with history of esophageal cancer s/p esophagectomy with recurrence of cancer but none noted on EGD done on 11/18. He is receiving no more chemotherapy due to his poor performance status. He is on oxygen due to recurrent aspiration from likely gastroparesis. I spoke to him about his current health state and he is against any sort of J tube and wants to go home and be with his family. I discussed how he would be hospice appropriate given his current status and he is in agreement with this. I also spoke with his wife  who agreed.  Recommendations:  - no indication for an EGD due to hemoptysis and not hematemesis. His issues began after blood transfusion with worsening infiltrates on CT scan so TRALI would have to be a consideration but diagnosis difficult given pre-existing lung disease and care is supportive any way - would re-discuss with palliative care or social work to see how hospice could be arranged for him  We will sign-off. Please call with any questions or concerns.  Raylene Miyamoto MD, MPH [facility] GI  Please call with questions or  concerns.

## 2020-08-13 NOTE — Plan of Care (Signed)

## 2020-08-14 ENCOUNTER — Telehealth: Payer: Self-pay | Admitting: Family Medicine

## 2020-08-14 ENCOUNTER — Encounter: Payer: Self-pay | Admitting: Internal Medicine

## 2020-08-14 DIAGNOSIS — E1143 Type 2 diabetes mellitus with diabetic autonomic (poly)neuropathy: Secondary | ICD-10-CM

## 2020-08-14 DIAGNOSIS — J9621 Acute and chronic respiratory failure with hypoxia: Secondary | ICD-10-CM

## 2020-08-14 DIAGNOSIS — K3184 Gastroparesis: Secondary | ICD-10-CM

## 2020-08-14 HISTORY — DX: Type 2 diabetes mellitus with diabetic autonomic (poly)neuropathy: E11.43

## 2020-08-14 LAB — CBC WITH DIFFERENTIAL/PLATELET
Abs Immature Granulocytes: 0.05 10*3/uL (ref 0.00–0.07)
Basophils Absolute: 0 10*3/uL (ref 0.0–0.1)
Basophils Relative: 0 %
Eosinophils Absolute: 0.1 10*3/uL (ref 0.0–0.5)
Eosinophils Relative: 2 %
HCT: 27.4 % — ABNORMAL LOW (ref 39.0–52.0)
Hemoglobin: 8.4 g/dL — ABNORMAL LOW (ref 13.0–17.0)
Immature Granulocytes: 1 %
Lymphocytes Relative: 7 %
Lymphs Abs: 0.5 10*3/uL — ABNORMAL LOW (ref 0.7–4.0)
MCH: 27 pg (ref 26.0–34.0)
MCHC: 30.7 g/dL (ref 30.0–36.0)
MCV: 88.1 fL (ref 80.0–100.0)
Monocytes Absolute: 0.6 10*3/uL (ref 0.1–1.0)
Monocytes Relative: 8 %
Neutro Abs: 6.2 10*3/uL (ref 1.7–7.7)
Neutrophils Relative %: 82 %
Platelets: 141 10*3/uL — ABNORMAL LOW (ref 150–400)
RBC: 3.11 MIL/uL — ABNORMAL LOW (ref 4.22–5.81)
RDW: 17.8 % — ABNORMAL HIGH (ref 11.5–15.5)
WBC: 7.5 10*3/uL (ref 4.0–10.5)
nRBC: 0 % (ref 0.0–0.2)

## 2020-08-14 LAB — BASIC METABOLIC PANEL
Anion gap: 6 (ref 5–15)
BUN: 18 mg/dL (ref 8–23)
CO2: 26 mmol/L (ref 22–32)
Calcium: 7.7 mg/dL — ABNORMAL LOW (ref 8.9–10.3)
Chloride: 104 mmol/L (ref 98–111)
Creatinine, Ser: 1.07 mg/dL (ref 0.61–1.24)
GFR, Estimated: >60 mL/min (ref >60)
Glucose, Bld: 80 mg/dL (ref 70–99)
Potassium: 3.3 mmol/L — ABNORMAL LOW (ref 3.5–5.1)
Sodium: 136 mmol/L (ref 135–145)

## 2020-08-14 MED ORDER — POTASSIUM CHLORIDE CRYS ER 20 MEQ PO TBCR
40.0000 meq | EXTENDED_RELEASE_TABLET | Freq: Once | ORAL | Status: AC
  Administered 2020-08-14: 40 meq via ORAL
  Filled 2020-08-14: qty 2

## 2020-08-14 MED ORDER — AMOXICILLIN-POT CLAVULANATE 875-125 MG PO TABS: 1.0000 | ORAL_TABLET | Freq: Two times a day (BID) | ORAL | 0 refills | Status: AC

## 2020-08-14 NOTE — Discharge Summary (Signed)
Physician Discharge Summary  CO[place] AST:[id number] DOB: 1946-04-23 DOA: 08/09/2020  PCP: Valerie Roys, DO  Admit date: 08/09/2020 Discharge date: 08/14/2020  Admitted From: Home Disposition: Home with hospice  Recommendations for Outpatient Follow-up:  1. Follow up with PCP in 1-2 weeks 2. Please obtain BMP/CBC in one week 3. Please follow up with your PCP on the following pending results: Unresulted Labs (From admission, onward)          Start     Ordered   08/11/20 0500  CBC with Differential/Platelet  Daily,   R      08/10/20 1511           Home Health: None Equipment/Devices: None  Discharge Condition: Stable CODE STATUS: DNR Diet recommendation: Cardiac  Subjective: Seen and examined.  Fully alert and oriented.  Denies any shortness of breath or any other complaint.  Wants to go home with hospice today.  Brief/Interim Summary: Ether Wolters Williamsonis a 74 y.o.malewith medical history significant for multiple medical problems came to ER due to nausea vomiting and problem swallowing.  No other complaint.He endorses baseline shortness of breath that was unchanged.He reports that the symptoms are baseline for him.   Has a history of esophageal dilatation procedure on 08/03/2020, he does feel better than prior to the procedure.  Lives with spouse. 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 He was a former tobacco user (started smoking at age 12, quit 2005), 4 stents at [facility] in 2005 and one stent in 2017. Endorses infrequent ETOH use (1/2 glass of whiskey per week).  Patient was admitted under hospitalist service due to multiple problems including nausea, vomiting, dehydration and acute on chronic hypoxic respiratory failure secondary to aspiration pneumonia.  He was started on broad-spectrum IV antibiotics, initially on Zosyn and then switched to Unasyn.  He was requiring about 4 L of oxygen.  Subsequently he was weaned down to  2 L which is his baseline.  GI was consulted.  They opined that patient's symptoms were secondary to gastroparesis.  Patient was then started on erythromycin and his symptoms improved.  Reportedly, patient was recently started on natamycin as outpatient but he had not started taking that.  He is a strongly recommended to take that.  CT abdomen and pelvis was also done which showed ascending colitis.  He was tested negative for C. difficile.  Patient was medically ready for discharge however then he developed hypotension.  Requiring couple of liters of IV fluid bolus and then subsequent maintenance fluids.  This resolved his hypotension.  His blood pressure has been back to his normal/baseline since last 2 days but then his hemoglobin dropped to 6.8 on 08/12/2020.  He was transfused 1 unit of PRBC.  Iron studies indicate anemia of chronic disease.  FOBT negative.  Posttransfusion, patient started having some hemoptysis as well as epistasis.  CT angiogram of the chest was done and he was ruled out of PE.  Due to concern of possible hematemesis, GI was reconsulted however they opined that patient had hemoptysis and not hematemesis and thus did not recommend any further intervention and signed off.  Patient's epistasis and hemoptysis stopped on its own and it has been about 24 hours that he has not had any symptoms like that.  His hemoglobin improved after transfusion.  Patient was also seen by palliative care.  He is DNR but he was not ready to be hospice.  GI/Dr. Haig Prophet discussed with the patient and his wife, he agreed  to go home with hospice.  Social worker is aware and is arranging hospice.  He is being discharged today.  I will prescribe him 5 more days of oral Augmentin for aspiration pneumonia.  Discharge Diagnoses:  Active Problems:   Acid reflux   Acute on chronic respiratory failure with hypoxia (HCC)   Vomiting   Dehydration   Aspiration pneumonia of both lungs (HCC)   Weight loss,  non-intentional   Goals of care, counseling/discussion   Palliative care by specialist   DNR (do not resuscitate)   Diabetic gastroparesis [facility])    Discharge Instructions  Discharge Instructions    Discharge patient   Complete by: As directed    Discharge disposition: 50-Hospice/Home   Discharge patient date: 08/14/2020     Allergies as of 08/14/2020   No Known Allergies     Medication List    TAKE these medications   acetaminophen 500 MG tablet Commonly known as: TYLENOL Take 500 mg by mouth every 6 (six) hours as needed for mild pain or moderate pain.   amoxicillin-clavulanate 875-125 MG tablet Commonly known as: AUGMENTIN Take 1 tablet by mouth 2 (two) times daily for 5 days.   aspirin 81 MG tablet Take 81 mg by mouth daily.   atorvastatin 40 MG tablet Commonly known as: LIPITOR Take 1 tablet (40 mg total) by mouth daily.   clopidogrel 75 MG tablet Commonly known as: PLAVIX Take 1 tablet (75 mg total) by mouth daily.   diphenoxylate-atropine 2.5-0.025 MG tablet Commonly known as: LOMOTIL Take 2 tablets by mouth 4 (four) times daily as needed for diarrhea or loose stools.   erythromycin 250 MG tablet Commonly known as: E-MYCIN Take 250 mg by mouth 4 (four) times daily.   feeding supplement Liqd Take 237 mLs by mouth 2 (two) times daily between meals.   levothyroxine 50 MCG tablet Commonly known as: SYNTHROID Take 1 tablet (50 mcg total) by mouth daily.   loperamide 2 MG tablet Commonly known as: IMODIUM A-D Take 2 mg by mouth 4 (four) times daily as needed for diarrhea or loose stools.   metoprolol succinate 25 MG 24 hr tablet Commonly known as: TOPROL-XL Take 0.5 tablets (12.5 mg total) by mouth daily.   multivitamin-iron-minerals-folic acid chewable tablet Chew 1 tablet by mouth daily.   nitroGLYCERIN 0.4 MG SL tablet Commonly known as: NITROSTAT Place 1 tablet under the tongue every 5 (five) minutes as needed for chest pain.   ondansetron  8 MG tablet Commonly known as: Zofran Take 1 tablet (8 mg total) by mouth every 8 (eight) hours as needed for nausea.   pantoprazole 20 MG tablet Commonly known as: PROTONIX Take 20 mg by mouth daily.   [facility] Colon Health Caps Take 1 capsule by mouth in the morning.       Follow-up Information    Xxavier, Noon P, DO Follow up in 1 week(s).   Specialty: Family Medicine Contact information: [place] [address] [phone]        [name], MD .   Specialty: [facility] Contact information: [name] [address] [phone]              No Known Allergies  Consultations: GI and palliative care's   Procedures/Studies: DG Chest 2 View  Result Date: 07/26/2020 CLINICAL DATA:  Multifocal pneumonia, productive cough. EXAM: CHEST - 2 VIEW COMPARISON:  July 10, 2020. FINDINGS: The heart size and mediastinal contours are within normal limits. No  pneumothorax is noted. Right subclavian Port-A-Cath is unchanged in position. Stable multiple opacities are noted throughout both lungs consistent with multifocal pneumonia. Small right pleural effusion is noted. The visualized skeletal structures are unremarkable. IMPRESSION: Stable bilateral multifocal pneumonia. Electronically Signed   By: Marijo Conception M.D.   On: 07/26/2020 08:59   CT ANGIO CHEST PE W OR WO CONTRAST  Result Date: 08/13/2020 CLINICAL DATA:  Shortness of breath. Previous surgery for esophageal carcinoma EXAM: CT ANGIOGRAPHY CHEST WITH CONTRAST TECHNIQUE: Multidetector CT imaging of the chest was performed using the standard protocol during bolus administration of intravenous contrast. Multiplanar CT image reconstructions and MIPs were obtained to evaluate the vascular anatomy. CONTRAST:  29mL OMNIPAQUE IOHEXOL 350 MG/ML SOLN COMPARISON:  August 09, 2020 chest CT FINDINGS: Cardiovascular: There is no demonstrable pulmonary embolus. There is no thoracic aortic aneurysm or  dissection. Minimal calcification noted at the origin of the left common carotid artery. No other great vessel calcification noted. There is aortic atherosclerosis. There are foci of coronary artery calcification. There is no appreciable pericardial effusion or pericardial thickening. Port-A-Cath tip in superior vena cava. Mediastinum/Nodes: Thyroid appears unremarkable. There is no appreciable thoracic adenopathy by size criteria. Occasional subcentimeter mediastinal lymph nodes do not meet size criteria for pathologic significance. Patient is undergone previous gastric swing through procedure with stomach above the diaphragm. Moderate food material noted within the stomach. Lungs/Pleura: Moderate free-flowing pleural effusion on the left noted. Minimal pleural effusion on the right. There is airspace consolidation throughout portions of the right middle and lower lobes as well as in portions of the right upper lobe. There has been progression of infiltrate in the right upper lobe compared to 4 days prior. Multiple patchy areas of opacity noted throughout the left lung with consolidation in portions of the left lower lobe, stable. Upper Abdomen: Visualized upper abdominal structures appear unremarkable. Musculoskeletal: No blastic or lytic bone lesions. There are foci of degenerative change in the thoracic spine. No chest wall lesions beyond port present anteriorly on the right. Review of the MIP images confirms the above findings. IMPRESSION: 1. No demonstrable pulmonary embolus. No thoracic aortic aneurysm or dissection. Foci of atherosclerosis in the aorta and foci of coronary artery calcification noted. 2. Status post gastric swing through procedure with stomach above the diaphragm. 3. Moderate left pleural effusion with minimal right pleural effusion. Multifocal areas of pneumonia with overall progression in the right upper lobe compared to recent study. Changes similar elsewhere. Given somewhat patchy  airspace opacity in portions of the upper lobes, check of COVID-19 status advised. Most of the pneumonia shows consolidation is felt to likely have bacterial etiology. Note that a degree of underlying metastatic disease could present with opacity similar to those seen in the upper lobe regions. 4.  No adenopathy appreciable. Aortic Atherosclerosis (ICD10-I70.0). Electronically Signed   By: Lowella Grip III M.D.   On: 08/13/2020 11:30   CT CHEST ABDOMEN PELVIS W CONTRAST  Result Date: 08/09/2020 CLINICAL DATA:  Nausea and vomiting in a patient with known esophageal cancer. EXAM: CT CHEST, ABDOMEN, AND PELVIS WITH CONTRAST TECHNIQUE: Multidetector CT imaging of the chest, abdomen and pelvis was performed following the standard protocol during bolus administration of intravenous contrast. CONTRAST:  66mL OMNIPAQUE IOHEXOL 300 MG/ML  SOLN COMPARISON:  Multiple prior studies including CT of the abdomen and pelvis from April 22, 2019 FINDINGS: CT CHEST FINDINGS Cardiovascular: Three-vessel coronary artery disease. Heart size is stable. Aortic caliber is stable at approximately 3.6 cm. RIGHT-sided  Port-A-Cath terminates in the mid superior vena cava as on the recent chest CT from 05/21/2020. Central pulmonary vasculature is unremarkable on venous phase assessment. Mediastinum/Nodes: Post esophagectomy with gastric pull-through. Patulous appearance of the pull-through which is filled partially with oral contrast media. No adenopathy in the chest. Lungs/Pleura: Numerous areas of airspace disease with nodular features and patchy areas of ground-glass. This is worse than on the study of May 21, 2020 now with involvement in the LEFT chest as well as the RIGHT and with more extensive involvement in the RIGHT chest when compared to the prior study. There is also slight interval enlargement of a very small RIGHT pleural effusion and interval development of a LEFT pleural effusion since the prior study.  Musculoskeletal: See below for full musculoskeletal details. CT ABDOMEN PELVIS FINDINGS Hepatobiliary: No focal, suspicious hepatic lesion. The portal vein is patent. Gallbladder is decompressed. No biliary duct dilation. Pancreas: Soft tissue along the posterior pancreas may have diminished in size but obliterates the normal fat about the celiac axis similar to distribution seen on prior study but again perhaps diminished in size. No intrapancreatic lesion or signs of inflammation. Spleen: Normal Adrenals/Urinary Tract: Normal adrenal glands. Symmetric renal enhancement. No hydronephrosis. The LEFT renal pelvic calculus has migrated slightly further into the renal pelvis measuring approximately 8 mm. Increasing stone burden in the LEFT kidney since previous imaging with another calculus in the interpolar calyx and infundibulum approximately 5 mm. Distal ureters are difficult to assess. Urinary bladder is under distended. Stomach/Bowel: Soft tissue about the hepatic gastric recess measuring approximately 3.4 x 2.1 cm in axial dimension. Less bulky appearance along the LEFT periaortic region where it previously measured approximately 2.3 cm in greatest thickness and now measures 1.3 cm. Greatest maximal dimension of soft tissue on the prior study was approximately 3.9 by 2.6 cm. No sign of small bowel obstruction. The ingested contrast material passes through the small bowel with early opacification of the cecum which is present in the pelvis. Stool filled colon with signs of colonic diverticulosis. Ascending colonic thickening colonic thickening. Vascular/Lymphatic: Calcified atheromatous plaque in the abdominal aorta. Aneurysmal dilation of the infrarenal abdominal aorta approximately 2.9 cm greatest caliber similar to the prior study accounting for tortuosity in the axial plane. Reproductive: Prostate heterogeneous on CT with nonspecific appearance. Other: Diffuse loss of subcutaneous fat and mesenteric fat since  the prior imaging evaluation makes assessment more difficult. Appendix not visualized without overt signs of acute appendicitis. Musculoskeletal: No acute musculoskeletal process. Spinal degenerative changes. IMPRESSION: 1. Findings of bilateral pneumonia with worsening compared to previous imaging. The possibility of background pulmonary metastatic disease is also considered based on previous PET imaging. There is rapid worsening in some areas, particularly the RIGHT upper lobe and LEFT lung base when compared to the previous more recent PET scan which would support a worsening overlay of infection perhaps from aspiration. 2. Ascending colonic thickening may be related to colitis, correlate with any risk factors for infectious colitis in the setting of recent antibiotic administration. 3. Post esophagectomy with gastric pull-through. Patulous appearance of the pull-through which is filled partially with oral contrast media further supporting the possibility of aspiration in the setting of repeated nausea and vomiting. 4. Decreased soft tissue is suggested in the upper abdomen with limited assessment due to cachexia and early venous phase on the current study. 5. filling of the cecum with ingested contrast material, cecum now in the pelvis. Appendix not visualized. No secondary signs to suggest acute appendicitis. Assessment  is however limited due to lack of intra-abdominal fat. If there is continued pain or symptoms that localized to the pelvis, repeat scanning could be considered after passage of oral contrast further into the colon. 6. Increased stone burden in the LEFT kidney. 7. Aneurysmal dilation of the infrarenal abdominal aorta approximately 2.9 cm greatest caliber similar to the prior study accounting for tortuosity in the axial plane. 8. Cachexia. Aortic Atherosclerosis (ICD10-I70.0). Electronically Signed   By: Zetta Bills M.D.   On: 08/09/2020 19:09   NM PET Image Initial (PI) Skull Base To  Thigh  Result Date: 07/27/2020 CLINICAL DATA:  Subsequent treatment strategy for esophageal cancer. EXAM: NUCLEAR MEDICINE PET SKULL BASE TO THIGH TECHNIQUE: 6.2 mCi F-18 FDG was injected intravenously. Full-ring PET imaging was performed from the skull base to thigh after the radiotracer. CT data was obtained and used for attenuation correction and anatomic localization. Fasting blood glucose: 75 mg/dl COMPARISON:  CT 05/21/2020, PET-CT 04/27/2020, PET-CT 12/07/2019 FINDINGS: Mediastinal blood pool activity: SUV max 1.6 Liver activity: SUV max 2.2 NECK: No hypermetabolic lymph nodes in the neck. Incidental CT findings: none CHEST: Progressive bilateral nodular foci with interstitial thickening within the LEFT and RIGHT lower lobe and progressing to the RIGHT upper lobe and inferior lingula over comparison exams from April 27, 2020 and May 21, 2020. These nodules reach consolidation within the RIGHT lower lobe. These nodules have associated metabolic activity. For example cluster of nodules in the RIGHT upper lobe with SUV max equal 4.2. LEFT lingular nodule thickening with SUV max equal 2.9. In the medial RIGHT lower lobe nodular focus with SUV max equal 5.5. Mild hypermetabolic lower paratracheal nodes. For example LEFT lower paratracheal node SUV max equal 3.2. Incidental CT findings: A gastric pull-up anatomy. The stomach is fluid-filled. ABDOMEN/PELVIS: Focus of metabolic activity midline in the upper abdomen there is poorly defined on the noncontrast CT with SUV max equal 3.7. No abnormal activity liver. No hypermetabolic abdominopelvic lymph nodes. Incidental CT findings: Little intra-abdominal fat makes evaluation difficult. SKELETON: No focal hypermetabolic activity to suggest skeletal metastasis. Incidental CT findings: none IMPRESSION: 1. Bilateral progressive interstitial thickening, pulmonary nodules and coalescence of nodularity with associated hypermetabolic activity. Differential includes  chronic progressive pulmonary infection versus malignancy including lymphangitic spread of carcinoma. Consider bronchoscopy and tissue sampling. 2. Mild mediastinal adenopathy with differential include reactive versus metastatic adenopathy. 3. Large volume of fluid within the gastric pull up. This could indicate risk for aspiration pneumonitis providing source of infection for the pulmonary findings. 4. No clear evidence of metastatic disease in the abdomen pelvis. Electronically Signed   By: Suzy Bouchard M.D.   On: 07/27/2020 15:14   DG Abdomen Acute W/Chest  Result Date: 08/09/2020 CLINICAL DATA:  Vomiting for 10 days EXAM: DG ABDOMEN ACUTE WITH 1 VIEW CHEST COMPARISON:  04/01/2019, 04/22/2019 FINDINGS: There is no evidence of dilated bowel loops or free intraperitoneal air. 8 mm rounded calcification projects in the region of the left renal shadow. Tortuous abdominal aorta with atherosclerotic calcifications. Stable right-sided chest port. Cardiomediastinal contours are within normal limits. Worsening bilateral airspace opacities, most confluent within the right upper lobe and left lower lobe. No large pleural fluid collection. No pneumothorax. IMPRESSION: 1. Worsening multifocal pneumonia. 2. Nonobstructive bowel gas pattern. 3. 8 mm calcification projects in the region of the left renal shadow. Electronically Signed   By: Davina Poke D.O.   On: 08/09/2020 16:28      Discharge Exam: Vitals:   08/14/20 9528 08/14/20 4132  BP: 134/77 131/75  Pulse: 65 70  Resp: 17 16  Temp: 97.8 F (36.6 C) 98 F (36.7 C)  SpO2: 90% 94%   Vitals:   08/13/20 1930 08/13/20 2300 08/14/20 0412 08/14/20 0755  BP: 136/81 133/74 134/77 131/75  Pulse: 65 70 65 70  Resp: 16 17 17 16   Temp: 98.9 F (37.2 C) 98.8 F (37.1 C) 97.8 F (36.6 C) 98 F (36.7 C)  TempSrc: Oral Oral    SpO2: 94% 95% 90% 94%  Weight:      Height:        General: Pt is alert, awake, not in acute distress Cardiovascular:  RRR, S1/S2 +, no rubs, no gallops Respiratory: Rhonchi bilaterally, no wheezing,  Abdominal: Soft, NT, ND, bowel sounds + Extremities: no edema, no cyanosis    The results of significant diagnostics from this hospitalization (including imaging, microbiology, ancillary and laboratory) are listed below for reference.     Microbiology: Recent Results (from the past 240 hour(s))  Resp Panel by RT-PCR (Flu A&B, Covid) Nasopharyngeal Swab     Status: None   Collection Time: 08/09/20  5:23 PM   Specimen: Nasopharyngeal Swab; Nasopharyngeal(NP) swabs in vial transport medium  Result Value Ref Range Status   SARS Coronavirus 2 by RT PCR NEGATIVE NEGATIVE Final    Comment: (NOTE) SARS-CoV-2 target nucleic acids are NOT DETECTED.  The SARS-CoV-2 RNA is generally detectable in upper respiratory specimens during the acute phase of infection. The lowest concentration of SARS-CoV-2 viral copies this assay can detect is 138 copies/mL. A negative result does not preclude SARS-Cov-2 infection and should not be used as the sole basis for treatment or other patient management decisions. A negative result may occur with  improper specimen collection/handling, submission of specimen other than nasopharyngeal swab, presence of viral mutation(s) within the areas targeted by this assay, and inadequate number of viral copies(<138 copies/mL). A negative result must be combined with clinical observations, patient history, and epidemiological information. The expected result is Negative.  Fact Sheet for Patients:  EntrepreneurPulse.com.au  Fact Sheet for Healthcare Providers:  IncredibleEmployment.be  This test is no t yet approved or cleared by the [place] FDA and  has been authorized for detection and/or diagnosis of SARS-CoV-2 by FDA under an Emergency Use Authorization (EUA). This EUA will remain  in effect (meaning this test can be used) for the duration of  the COVID-19 declaration under Section 564(b)(1) of the Act, 21 U.S.C.section 360bbb-3(b)(1), unless the authorization is terminated  or revoked sooner.       Influenza A by PCR NEGATIVE NEGATIVE Final   Influenza B by PCR NEGATIVE NEGATIVE Final    Comment: (NOTE) The Xpert Xpress SARS-CoV-2/FLU/RSV plus assay is intended as an aid in the diagnosis of influenza from Nasopharyngeal swab specimens and should not be used as a sole basis for treatment. Nasal washings and aspirates are unacceptable for Xpert Xpress SARS-CoV-2/FLU/RSV testing.  Fact Sheet for Patients: EntrepreneurPulse.com.au  Fact Sheet for Healthcare Providers: IncredibleEmployment.be  This test is not yet approved or cleared by the [place] FDA and has been authorized for detection and/or diagnosis of SARS-CoV-2 by FDA under an Emergency Use Authorization (EUA). This EUA will remain in effect (meaning this test can be used) for the duration of the COVID-19 declaration under Section 564(b)(1) of the Act, 21 U.S.C. section 360bbb-3(b)(1), unless the authorization is terminated or revoked.  Performed at [facility], [address], [place], [place] [address]   C Difficile Quick  Screen w PCR reflex     Status: None   Collection Time: 08/10/20 10:42 AM   Specimen: STOOL  Result Value Ref Range Status   C Diff antigen NEGATIVE NEGATIVE Final   C Diff toxin NEGATIVE NEGATIVE Final   C Diff interpretation No C. difficile detected.  Final    Comment: Performed at [facility], [address] [place], [place] [address]     Labs: BNP (last 3 results) No results for input(s): BNP in the last 8760 hours. Basic Metabolic Panel: Recent Labs  Lab 08/10/20 0609 08/11/20 0500 08/12/20 0344 08/13/20 0545 08/14/20 0505  NA 134* 138 136 135 136  K 4.1 3.8 3.4* 3.5 3.3*  CL 97* 101 104 103 104  CO2 29 28 26 26 26   GLUCOSE 75 144* 89 86 80  BUN 32* 29*  25* 19 18  CREATININE 1.30* 1.34* 1.05 1.13 1.07  CALCIUM 8.2* 7.8* 7.7* 7.7* 7.7*  MG  --  2.0  --   --   --    Liver Function Tests: Recent Labs  Lab 08/09/20 1341 08/11/20 0500 08/12/20 0344  AST 32 30 31  ALT 25 20 20   ALKPHOS 68 58 64  BILITOT 0.7 0.3 0.5  PROT 8.1 6.8 6.4*  ALBUMIN 2.2* 1.9* 1.7*   Recent Labs  Lab 08/09/20 1341  LIPASE 28   No results for input(s): AMMONIA in the last 168 hours. CBC: Recent Labs  Lab 08/11/20 0500 08/11/20 0500 08/12/20 0344 08/12/20 1135 08/13/20 0545 08/13/20 2031 08/14/20 0505  WBC 11.8*  --  9.3  --  9.9 5.6 7.5  NEUTROABS 10.6*  --  8.1*  --  8.4*  --  6.2  HGB 7.4*   < > 7.1* 6.8* 9.0* 7.6* 8.4*  HCT 24.1*   < > 23.9* 22.1* 28.8* 24.5* 27.4*  MCV 88.9  --  90.2  --  87.8 88.1 88.1  PLT 129*  --  113*  --  148* 121* 141*   < > = values in this interval not displayed.   Cardiac Enzymes: No results for input(s): CKTOTAL, CKMB, CKMBINDEX, TROPONINI in the last 168 hours. BNP: Invalid input(s): POCBNP CBG: No results for input(s): GLUCAP in the last 168 hours. D-Dimer No results for input(s): DDIMER in the last 72 hours. Hgb A1c No results for input(s): HGBA1C in the last 72 hours. Lipid Profile No results for input(s): CHOL, HDL, LDLCALC, TRIG, CHOLHDL, LDLDIRECT in the last 72 hours. Thyroid function studies No results for input(s): TSH, T4TOTAL, T3FREE, THYROIDAB in the last 72 hours.  Invalid input(s): FREET3 Anemia work up Recent Labs    08/12/20 0605  VITAMINB12 305  FOLATE 12.6  FERRITIN 253  TIBC 127*  IRON 10*  RETICCTPCT 0.6   Urinalysis    Component Value Date/Time   COLORURINE YELLOW (A) 05/21/2020 2251   APPEARANCEUR CLEAR (A) 05/21/2020 2251   APPEARANCEUR Cloudy (A) 06/23/2019 0822   LABSPEC >1.046 (H) 05/21/2020 2251   PHURINE 5.0 05/21/2020 2251   GLUCOSEU NEGATIVE 05/21/2020 2251   HGBUR NEGATIVE 05/21/2020 2251   BILIRUBINUR NEGATIVE 05/21/2020 2251   BILIRUBINUR Negative  06/23/2019 0822   KETONESUR NEGATIVE 05/21/2020 2251   PROTEINUR NEGATIVE 05/21/2020 2251   NITRITE NEGATIVE 05/21/2020 2251   LEUKOCYTESUR NEGATIVE 05/21/2020 2251   Sepsis Labs Invalid input(s): PROCALCITONIN,  WBC,  LACTICIDVEN Microbiology Recent Results (from the past 240 hour(s))  Resp Panel by RT-PCR (Flu A&B, Covid) Nasopharyngeal Swab     Status:  None   Collection Time: 08/09/20  5:23 PM   Specimen: Nasopharyngeal Swab; Nasopharyngeal(NP) swabs in vial transport medium  Result Value Ref Range Status   SARS Coronavirus 2 by RT PCR NEGATIVE NEGATIVE Final    Comment: (NOTE) SARS-CoV-2 target nucleic acids are NOT DETECTED.  The SARS-CoV-2 RNA is generally detectable in upper respiratory specimens during the acute phase of infection. The lowest concentration of SARS-CoV-2 viral copies this assay can detect is 138 copies/mL. A negative result does not preclude SARS-Cov-2 infection and should not be used as the sole basis for treatment or other patient management decisions. A negative result may occur with  improper specimen collection/handling, submission of specimen other than nasopharyngeal swab, presence of viral mutation(s) within the areas targeted by this assay, and inadequate number of viral copies(<138 copies/mL). A negative result must be combined with clinical observations, patient history, and epidemiological information. The expected result is Negative.  Fact Sheet for Patients:  EntrepreneurPulse.com.au  Fact Sheet for Healthcare Providers:  IncredibleEmployment.be  This test is no t yet approved or cleared by the [place] FDA and  has been authorized for detection and/or diagnosis of SARS-CoV-2 by FDA under an Emergency Use Authorization (EUA). This EUA will remain  in effect (meaning this test can be used) for the duration of the COVID-19 declaration under Section 564(b)(1) of the Act, 21 U.S.C.section  360bbb-3(b)(1), unless the authorization is terminated  or revoked sooner.       Influenza A by PCR NEGATIVE NEGATIVE Final   Influenza B by PCR NEGATIVE NEGATIVE Final    Comment: (NOTE) The Xpert Xpress SARS-CoV-2/FLU/RSV plus assay is intended as an aid in the diagnosis of influenza from Nasopharyngeal swab specimens and should not be used as a sole basis for treatment. Nasal washings and aspirates are unacceptable for Xpert Xpress SARS-CoV-2/FLU/RSV testing.  Fact Sheet for Patients: EntrepreneurPulse.com.au  Fact Sheet for Healthcare Providers: IncredibleEmployment.be  This test is not yet approved or cleared by the [place] FDA and has been authorized for detection and/or diagnosis of SARS-CoV-2 by FDA under an Emergency Use Authorization (EUA). This EUA will remain in effect (meaning this test can be used) for the duration of the COVID-19 declaration under Section 564(b)(1) of the Act, 21 U.S.C. section 360bbb-3(b)(1), unless the authorization is terminated or revoked.  Performed at [facility], [address] [place], [place] [address]   C Difficile Quick Screen w PCR reflex     Status: None   Collection Time: 08/10/20 10:42 AM   Specimen: STOOL  Result Value Ref Range Status   C Diff antigen NEGATIVE NEGATIVE Final   C Diff toxin NEGATIVE NEGATIVE Final   C Diff interpretation No C. difficile detected.  Final    Comment: Performed at [facility], [address] [place], [place] [address]     Time coordinating discharge: Over 30 minutes  SIGNED:   Darliss Cheney, MD  [facility] 08/14/2020, 9:37 AM  If 7PM-7AM, please contact night-coverage [email or web address]

## 2020-08-14 NOTE — Evaluation (Signed)
Physical Therapy Evaluation Patient Details Name: Corey Perry MRN: [id number] DOB: December 23, 1945 Today's Date: 08/14/2020   History of Present Illness  Pt is a 74 y.o. male presenting to hospital 11/24 with emesis; pt with vomiting/dehydration/gastroparesis, possible PNA, and persistent N/V.  Post blood tranfusion pt with nose bleeds with clots and hemoptysis.  PMH includes esophageal CA, anemia, a-fib, bradycardia, htn, MI, sleep apnea, spinal stenosis, h/o back surgery.  Clinical Impression  Prior to hospital admission, pt was independent with ambulation; recent 2 L home O2 use; lives in 1 level home with wife with 4 STE B railings.  Currently pt is modified independent with bed mobility, SBA with transfers, and CGA ambulating 60 feet with RW (pt normally does not use any AD for ambulation but pt requesting to use RW for ambulation today).  Limited distance ambulating d/t SOB (unable to get O2 reading immediately post ambulation but pt's O2 reading 92% on 3 L O2 via nasal cannula once obtained).  Pt would benefit from skilled PT to address noted impairments and functional limitations (see below for any additional details).  Upon hospital discharge, pt would benefit from [place].    Follow Up Recommendations Home health PT    Equipment Recommendations  Other (comment) (pt has RW at home already)    Recommendations for Other Services       Precautions / Restrictions Precautions Precautions: Fall Precaution Comments: Aspiration; R chest port Restrictions Weight Bearing Restrictions: No      Mobility  Bed Mobility Overal bed mobility: Modified Independent             General bed mobility comments: Semi-supine to/from sitting without any noted difficulty    Transfers Overall transfer level: Needs assistance Equipment used: None Transfers: Sit to/from Stand Sit to Stand: Supervision         General transfer comment: fairly strong stand noted and controlled descent  sitting  Ambulation/Gait Ambulation/Gait assistance: Min guard Gait Distance (Feet): 60 Feet Assistive device: Rolling walker (2 wheeled) (pt requesting to use RW)   Gait velocity: decreased   General Gait Details: partial step through gait pattern; steady with RW; limited distance d/t SOB  Stairs            Wheelchair Mobility    Modified Rankin (Stroke Patients Only)       Balance Overall balance assessment: Needs assistance Sitting-balance support: No upper extremity supported;Feet supported Sitting balance-Leahy Scale: Normal Sitting balance - Comments: steady sitting reaching outside BOS   Standing balance support: No upper extremity supported Standing balance-Leahy Scale: Good Standing balance comment: steady standing reaching within BOS                             Pertinent Vitals/Pain Pain Assessment: No/denies pain  HR 72-100 bpm during sessions activities.    Home Living Family/patient expects to be discharged to:: Private residence Living Arrangements: Spouse/significant other Available Help at Discharge: Family Type of Home: Mobile home Home Access: Stairs to enter Entrance Stairs-Rails: Right;Left;Can reach both Entrance Stairs-Number of Steps: [place]: One level Home Equipment: [place] - 2 wheels;Cane - quad      Prior Function Level of Independence: Independent         Comments: 2 L home O2 recently.  No recent falls     Hand Dominance        Extremity/Trunk Assessment   Upper Extremity Assessment Upper Extremity Assessment: Generalized weakness    Lower  Extremity Assessment Lower Extremity Assessment: Generalized weakness    Cervical / Trunk Assessment Cervical / Trunk Assessment: Normal  Communication   Communication: No difficulties  Cognition Arousal/Alertness: Awake/alert Behavior During Therapy: WFL for tasks assessed/performed Overall Cognitive Status: Within Functional Limits for tasks assessed                                         General Comments   Nursing cleared pt for participation in physical therapy.  Pt agreeable to PT session.    Exercises     Assessment/Plan    PT Assessment Patient needs continued PT services  PT Problem List Decreased strength;Decreased activity tolerance;Decreased mobility;Decreased balance;Cardiopulmonary status limiting activity       PT Treatment Interventions DME instruction;Gait training;Stair training;Functional mobility training;Therapeutic activities;Therapeutic exercise;Balance training;Patient/family education    PT Goals (Current goals can be found in the Care Plan section)  Acute Rehab PT Goals Patient Stated Goal: to go home PT Goal Formulation: With patient Time For Goal Achievement: 08/28/20 Potential to Achieve Goals: Good    Frequency Min 2X/week   Barriers to discharge        Co-evaluation               AM-PAC PT "6 Clicks" Mobility  Outcome Measure Help needed turning from your back to your side while in a flat bed without using bedrails?: None Help needed moving from lying on your back to sitting on the side of a flat bed without using bedrails?: None Help needed moving to and from a bed to a chair (including a wheelchair)?: A Little Help needed standing up from a chair using your arms (e.g., wheelchair or bedside chair)?: A Little Help needed to walk in hospital room?: A Little Help needed climbing 3-5 steps with a railing? : A Little 6 Click Score: 20    End of Session Equipment Utilized During Treatment: Gait belt;Oxygen (3 L O2 via nasal cannula) Activity Tolerance: Other (comment) (Limited d/t SOB with activity) Patient left: in bed;with call bell/phone within reach;with bed alarm set Nurse Communication: Mobility status;Precautions PT Visit Diagnosis: Other abnormalities of gait and mobility (R26.89);Muscle weakness (generalized) (M62.81)    Time: 6415-8309 PT Time Calculation  (min) (ACUTE ONLY): 28 min   Charges:   PT Evaluation $PT Eval Low Complexity: 1 Low PT Treatments $Therapeutic Activity: 8-22 mins       Leitha Bleak, PT 08/14/20, 10:32 AM

## 2020-08-14 NOTE — Evaluation (Signed)
Clinical/Bedside Swallow Evaluation Patient Details  Name: Corey Perry MRN: [id number] Date of Birth: Aug 27, 1946  Today's Date: 08/14/2020 Time: SLP Start Time (ACUTE ONLY): 3903 SLP Stop Time (ACUTE ONLY): 1130 SLP Time Calculation (min) (ACUTE ONLY): 48 min  Past Medical History:  Past Medical History:  Diagnosis Date  . Abnormal white blood cell (WBC) count    seeing Dr. [name] at [facility] on 10/25  . Anemia   . Atrial fibrillation ([place])   . BPH (benign prostatic hypertrophy)   . Bradycardia   . CAD (coronary artery disease)   . Diabetic gastroparesis ([place]) 08/14/2020  . Dysrhythmia    bradycardia - sees Dr. Humphrey Rolls  . Esophageal cancer ([place]) 05/2016  . GERD (gastroesophageal reflux disease)   . History of kidney stones   . Hyperlipidemia   . Hypertension   . Hypothyroidism   . Kidney stone   . Myocardial infarction ([place]) 2005  . Pneumonia   . Pneumonia 06/22/2020  . S/P angioplasty with stent 2005   x 4 vessels  . Sleep apnea    mild-NO CPAP  . Spinal stenosis    lumbar   Past Surgical History:  Past Surgical History:  Procedure Laterality Date  . BACK SURGERY    . CARDIAC CATHETERIZATION Left 03/12/2016   Procedure: Left Heart Cath and Coronary Angiography;  Surgeon: [name], MD;  Location: [facility];  Service: Cardiovascular;  Laterality: Left;  . CARDIAC CATHETERIZATION N/A 03/12/2016   Procedure: Coronary Stent Intervention;  Surgeon: Yolonda Kida, MD;  Location: [facility];  Service: Cardiovascular;  Laterality: N/A;  . COLONOSCOPY WITH PROPOFOL N/A 07/13/2015   Procedure: COLONOSCOPY WITH PROPOFOL;  Surgeon: Lucilla Lame, MD;  Location: [facility];  Service: Endoscopy;  Laterality: N/A;  . CORONARY ANGIOPLASTY  10/05 and 12/05   4 DES placed  . CYSTOSCOPY W/ RETROGRADES Left 06/15/2019   Procedure: CYSTOSCOPY WITH RETROGRADE PYELOGRAM;  Surgeon: Abbie Sons, MD;  Location: [facility];  Service: Urology;   Laterality: Left;  . CYSTOSCOPY/URETEROSCOPY/HOLMIUM LASER/STENT PLACEMENT Left 06/15/2019   Procedure: CYSTOSCOPY/URETEROSCOPY/HOLMIUM LASER/STENT PLACEMENT;  Surgeon: Abbie Sons, MD;  Location: [facility];  Service: Urology;  Laterality: Left;  . ESOPHAGECTOMY  10/07/2016   @ [facility]  . ESOPHAGOGASTRODUODENOSCOPY (EGD) WITH PROPOFOL N/A 06/14/2016   Procedure: ESOPHAGOGASTRODUODENOSCOPY (EGD) WITH PROPOFOL;  Surgeon: [name], MD;  Location[facility];  Service: Endoscopy;  Laterality: N/A;  . HERNIA REPAIR Bilateral    x3  . INGUINAL HERNIA REPAIR Right 08/28/2017   Large PerFix plug, recurrent hernia;  Surgeon: Robert Bellow, MD;  Location: [facility];  Service: General;  Laterality: Right;  recurrent  . KIDNEY STONE SURGERY  07/15/2019  . POLYPECTOMY  07/13/2015   Procedure: POLYPECTOMY;  Surgeon: Lucilla Lame, MD;  Location: [facility];  Service: Endoscopy;;  . PORT-A-CATH REMOVAL N/A 01/16/2017   Procedure: REMOVAL PORT-A-CATH;  Surgeon: Olean Ree, MD;  Location: [facility];  Service: General;  Laterality: N/A;  . PORTACATH PLACEMENT N/A 07/08/2016   Procedure: INSERTION PORT-A-CATH;  Surgeon: Olean Ree, MD;  Location: [facility];  Service: General;  Laterality: N/A;  . PORTACATH PLACEMENT Right 08/30/2019   Procedure: INSERTION PORT-A-CATH;  Surgeon: Olean Ree, MD;  Location: [facility];  Service: General;  Laterality: Right;   HPI:  Corey Perry is a 74 y.o. male patient with recent h/o esophageal cancer(2017).  He had a his esophageal dilated recently which he says is "helping" w/ the foods.  He also says he was diagnosed with pneumonia and had some antibiotics given to him after that for the last 10 days he has been vomiting and cannot keep almost anything down.  Sometimes food seems to get stuck and he vomits but other times he just vomits without the food getting stuck.  Lives with spouse. 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 He  was a former tobacco user (started smoking at age 27, quit 2005), 4 stents at [facility] in 2005 and one stent in 2017.  Corey Perry has unintentional weight loss per chart.  Corey Perry denied any oropharyngeal phase deficits.  Chest CT: Moderate left pleural effusion with minimal right pleural effusion.    Assessment / Plan / Recommendation Clinical Impression  Corey Perry appears to present w/ adequate oropharyngeal phase swallow w/ No oropharyngeal phase dysphagia noted, No neuromuscular deficits noted.Corey Perry does have a Baseline h/o Esophageal phase Dysmotility and deficits requiring recent Esophageal Dilation which he states "helps" when eating solid foods. Corey Perry has had recent days of Regurgitation which may have caused some irritation of the Esophageal and pharynx. He stated he is cautious when eating "certain foods if too tough to swallow". Corey Perry consumed po trials w/ No overt, clinical s/s of aspiration during po trials. Corey Perry appears at reduced risk for aspiration following general aspiration precautions; Reflux precautions.  During po trials, Corey Perry consumed all consistencies w/ no overt coughing, decline in vocal quality, or change in respiratory presentation during/post trials. Oral phase appeared [facility] w/ timely bolus management, mastication, and control of bolus propulsion for A-P transfer for swallowing. Oral clearing achieved w/ all trial consistencies. Cursory OM Exam appeared [facility] w/ no unilateral weakness noted. Speech Clear. Corey Perry fed self w/ setup support.  Recommend continue a Regular consistency diet w/ well-Cut meats, moistened foods -- AVOID Solid Foods IF problematic moving through the Esophagus d/t Baseline Dysmotility; Thin liquidsVIA CUP. Recommend general aspiration precautions, Pills WHOLE in Puree if easier swallowing. Education given on Pills in Puree; food consistencies and easy to eat options; general aspiration precautions; handouts on Esophageal precautions. NSG to reconsult if any new needs arise. NSG agreed.  SLP Visit  Diagnosis: Dysphagia, unspecified (R13.10) (Esophageal dysmotility baseline)    Aspiration Risk   (reduced following general precautions; Reflux precs.)    Diet Recommendation  more Mech Soft/Regular consistency -- moist, cut foods; Thin liquids. General aspiration, Reflux precautions  Medication Administration: Whole meds with liquid (in puree IF needed for ease)    Other  Recommendations Recommended Consults: Consider GI evaluation;Consider esophageal assessment (ongoing f/u) Oral Care Recommendations: Oral care BID;Patient independent with oral care Other Recommendations:  (n/a)   Follow up Recommendations None      Frequency and Duration  (n/a)   (n/a)       Prognosis Prognosis for Safe Diet Advancement: Good Barriers to Reach Goals: Severity of deficits;Time post onset (Esophageal phase dysmotility, deficits)      Swallow Study   General Date of Onset: 08/09/20 HPI: Corey Perry is a 74 y.o. male patient with recent h/o esophageal cancer(2017).  He had a his esophageal dilated recently which he says is "helping" w/ the foods.  He also says he was diagnosed with pneumonia and had some antibiotics given to him after that for the last 10 days he has been vomiting and cannot keep almost anything down.  Sometimes food seems to get stuck and he vomits but other times he just vomits without the food getting stuck.  Lives with spouse. 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 He was a former tobacco user (started smoking at age 14, quit 2005), 4 stents at [facility] in 2005 and one stent in 2017.  Corey Perry has unintentional weight loss per chart.  Corey Perry denied any oropharyngeal phase deficits.  Chest CT: Moderate left pleural effusion with minimal right pleural effusion.  Type of Study: Bedside Swallow Evaluation Previous Swallow Assessment: none Diet Prior to this Study: Regular;Thin liquids Temperature Spikes Noted: No (wbc 7.5) Respiratory Status: Nasal cannula (2 L) History  of Recent Intubation: No Behavior/Cognition: Alert;Cooperative;Pleasant mood Oral Cavity Assessment: Within Functional Limits Oral Care Completed by SLP: Recent completion by staff Oral Cavity - Dentition: Missing dentition Vision: Functional for self-feeding Self-Feeding Abilities: Able to feed self Patient Positioning: Upright in bed Baseline Vocal Quality: Normal Volitional Cough: Strong Volitional Swallow: Able to elicit    Oral/Motor/Sensory Function Overall Oral Motor/Sensory Function: Within functional limits (w/ bolus management; speech)   Ice Chips Ice chips: Not tested   Thin Liquid Thin Liquid: Within functional limits Presentation: Cup;Self Fed (~2-3 ozs)    Nectar Thick Nectar Thick Liquid: Not tested   Honey Thick Honey Thick Liquid: Not tested   Puree Puree: Not tested   Solid     Solid: Not tested       Orinda Kenner, MS, CCC-SLP Speech Language Pathologist Rehab Services [phone] [facility] 08/14/2020,1:04 PM

## 2020-08-14 NOTE — Discharge Instructions (Signed)
Aspiration Pneumonia Aspiration pneumonia is an infection in the lungs. It occurs when saliva or liquid contaminated with bacteria is inhaled (aspirated) into the lungs. When these things get into the lungs, swelling (inflammation) and infection can occur. This can make it difficult to breathe. Aspiration pneumonia is a serious condition and can be life threatening. What are the causes? This condition is caused when saliva or liquid from the mouth, throat, or stomach is inhaled into the lungs, and when those fluids are contaminated with bacteria. What increases the risk? The following factors may make you more likely to develop this condition:  A narrowing of the tube that carries food to the stomach (esophageal narrowing).  Having gastroesophageal reflux disease (GERD).  Having a weak immune system.  Having diabetes.  Having poor oral hygiene.  Being malnourished. The condition is more likely to occur when a person's cough (gag) reflex, or ability to swallow, has decreased. Some things that can cause this decrease include:  Having a brain injury or disease, such as stroke, seizures, Parkinson disease, dementia, or amyotrophic lateral sclerosis (ALS).  Being given a general anesthetic for procedures.  Drinking too much alcohol. If a person passes out and vomits, vomit can be inhaled into the lungs.  Taking certain medicines, such as tranquilizers or sedatives. What are the signs or symptoms? Symptoms of this condition include:  Fever.  A cough with secretions that are yellow, tan, or green.  Breathing problems, such as wheezing or shortness of breath.  Chest pain.  Being more tired than usual (fatigue).  Having a history of coughing while eating or drinking.  Bad breath.  Bluish color to the lips, skin, or fingers. How is this diagnosed? This condition may be diagnosed based on:  A physical exam.  Tests, such as: ? Chest X-ray. ? Sputum culture. Saliva and mucus  (sputum) are collected from the lungs or the tubes that carry air to the lungs (bronchi). The sputum is then tested for bacteria. ? Oximetry. A sensor or clip is placed on areas such as a finger, earlobe, or toe to measure the oxygen level in your blood. ? Blood tests. ? Swallowing study. This test looks at how food is swallowed and whether it goes into your breathing tube (trachea) or esophagus. ? Bronchoscopy. This test uses a flexible tube (bronchoscope) to see inside the lungs. How is this treated? This condition may be treated with:  Medicines. Antibiotic medicine will be given to kill the pneumonia bacteria. Other medicines may also be used to reduce fever or pain.  Breathing assistance and oxygen therapy. Depending on how well you are breathing, you may need to be given oxygen, or you may need breathing support from a breathing machine (ventilator).  Thoracentesis. This is a procedure to remove fluid that has built up in the space between the linings of the chest wall and the lungs.  Feeding tube and diet change. For people who have difficulty swallowing, a feeding tube might be placed in the stomach, or they may be asked to avoid certain food textures or liquids when eating. Follow these instructions at home: Medicines  Take over-the-counter and prescription medicines only as told by your health care provider. ? If you were prescribed an antibiotic medicine, take it as told by your health care provider. Do not stop taking the antibiotic even if you start to feel better. ? Take cough medicine only if you are losing sleep. Cough medicine can prevent your body's natural ability to remove mucus   from your lungs. General instructions  Carefully follow any eating instructions you were given, such as avoiding certain food textures or thickening your liquids. Thickening liquids reduces the risk of developing aspiration pneumonia again.  Use breathing exercises such as postural drainage, deep  breathing, and incentive spirometry to help expel secretions.  Rest as instructed by your health care provider.  Sleep in a semi-upright position at night. Try to sleep in a reclining chair, or place a few pillows under your head.  Do not use any products that contain nicotine or tobacco, such as cigarettes and e-cigarettes. If you need help quitting, ask your health care provider.  Keep all follow-up visits as told by your health care provider. This is important. Contact a health care provider if:  You have a fever.  You have a worsening cough with yellow, tan, or green secretions.  You have coughing while eating or drinking. Get help right away if:  You have worsening shortness of breath, wheezing, or difficulty breathing.  You have chest pain. Summary  Aspiration pneumonia is an infection in the lungs. It is caused when saliva or liquid from the mouth, throat, or stomach is inhaled into the lungs.  Aspiration pneumonia is more likely to occur when a person's cough reflex or ability to swallow has decreased.  Symptoms of aspiration pneumonia include coughing, breathing problems, fever, and chest pain.  Aspiration pneumonia may be treated with antibiotic medicine, other medicines to reduce pain or fever, and breathing assistance or oxygen therapy. This information is not intended to replace advice given to you by your health care provider. Make sure you discuss any questions you have with your health care provider. Document Revised: 08/15/2017 Document Reviewed: 10/08/2016 Elsevier Patient Education  2020 [employer]  

## 2020-08-14 NOTE — Progress Notes (Addendum)
[place] Room [facility]) Hospital Liaison RN note:  Received request from Dr. Darliss Cheney and Doran Clay, [facility] for hospice services at home after discharge. Chart and patient information under review by [facility] physician. Hospice eligibility has been approved.  Spoke with Mr. Tomas in the room to initiate education related to hospice philosophy, services and to answer any questions. Patient verbalized understanding of information given. Per discussion, plan is for discharge home today by private vehicle.  DME needs discussed. Patient already has oxygen at home but does not know what company that he is using. He dose not need any other equipment at this time. Address has been verified and is correct in the chart. Contact is spouse, Meryl Crutch and her phone number is [phone].  Please send signed and completed DNR home with patient. Please provide prescriptions at discharge as needed to ensure ongoing symptom management.  [facility] information and contact numbers given to patient. Above information shared with hospital care team.  Please call with any hospice related questions or concerns.  Thank you for the opportunity to participate in this patient's care.  Zandra Abts, RN [facility] Liaison [phone]

## 2020-08-14 NOTE — Progress Notes (Signed)
Pt d/c to home via family friend. IV removed intact. Port deacessed. Signed DNR paper sent with pt. Education completed. All belongings sent with pt.

## 2020-08-14 NOTE — Telephone Encounter (Signed)
Absolutely.

## 2020-08-14 NOTE — Telephone Encounter (Signed)
Called and left a detailed message for Corey Perry.

## 2020-08-14 NOTE — Care Management Important Message (Signed)
Important Message  Patient Details  Name: Corey Perry MRN: [id number] Date of Birth: 07/11/1946   Medicare Important Message Given:  Yes     Corey Perry 08/14/2020, 10:27 AM

## 2020-08-14 NOTE — Telephone Encounter (Signed)
Case manager called to ask which company the doctor used to order the patient's oxygen.  She stated that the patient did not know.  Please call to discuss at [phone]

## 2020-08-14 NOTE — Plan of Care (Signed)

## 2020-08-14 NOTE — Telephone Encounter (Signed)
Corey Perry with [facility] called to see if Dr. Wynetta Emery would agree to be hospice attending phy for this pt and that the pt is terminally ill  [phone]

## 2020-08-14 NOTE — TOC Transition Note (Signed)
Transition of Care [facility]) - CM/SW Discharge Note   Patient Details  Name: Corey Perry MRN: [id number] Date of Birth: 21-Sep-1945  [facility]) CM/SW Contact:  Shelbie Hutching, RN Phone Number: 08/14/2020, 10:09 AM   Clinical Narrative:    Patient is medically cleared for discharge home.  Patient chooses to go home with hospice and chooses [facility].  Santiago Glad and Kieth Brightly with [facility] given referral.  Patient declines any DME, he has a walker at home, and he currently has oxygen at home but cannot tell me the name of the company it came from.   RNCM will call PCP office to see who they ordered the oxygen through.  PCP is [facility], Dr. Wynetta Emery.  Patient's cousin will be picking him up today at discharge.    Final next level of care: Home w Hospice Care Barriers to Discharge: Barriers Resolved   Patient Goals and CMS Choice Patient states their goals for this hospitalization and ongoing recovery are:: To go home with hospice CMS Medicare.gov Compare Post Acute Care list provided to:: Patient Choice offered to / list presented to : Patient  Discharge Placement                       Discharge Plan and Services In-house Referral: Hospice / Palliative Care Discharge Planning Services: CM Consult Post Acute Care Choice: Hospice          DME Arranged: N/A DME Agency: NA       HH Arranged: Patient Refused [place] Agency: NA        Social Determinants of Health ([place]) Interventions     Readmission Risk Interventions Readmission Risk Prevention Plan 08/13/2020  Transportation Screening Complete  PCP or Specialist Appt within 3-5 Days Complete  HRI or [place] Complete  Social Work Consult for [place] Planning/Counseling Complete  Palliative Care Screening Complete  Medication Review [employer]) Complete  Some recent data might be hidden

## 2020-08-16 ENCOUNTER — Inpatient Hospital Stay (HOSPITAL_BASED_OUTPATIENT_CLINIC_OR_DEPARTMENT_OTHER): Payer: Medicare Other | Admitting: Hospice and Palliative Medicine

## 2020-08-16 ENCOUNTER — Inpatient Hospital Stay (HOSPITAL_BASED_OUTPATIENT_CLINIC_OR_DEPARTMENT_OTHER): Payer: Medicare Other | Admitting: Oncology

## 2020-08-16 ENCOUNTER — Inpatient Hospital Stay: Payer: Medicare Other | Attending: Oncology

## 2020-08-16 ENCOUNTER — Encounter: Payer: Self-pay | Admitting: Oncology

## 2020-08-16 VITALS — BP 101/73 | HR 83 | Temp 96.2°F | Resp 16 | Wt 129.2 lb

## 2020-08-16 DIAGNOSIS — N200 Calculus of kidney: Secondary | ICD-10-CM | POA: Diagnosis not present

## 2020-08-16 DIAGNOSIS — I252 Old myocardial infarction: Secondary | ICD-10-CM | POA: Diagnosis not present

## 2020-08-16 DIAGNOSIS — E785 Hyperlipidemia, unspecified: Secondary | ICD-10-CM | POA: Diagnosis not present

## 2020-08-16 DIAGNOSIS — Z87891 Personal history of nicotine dependence: Secondary | ICD-10-CM | POA: Insufficient documentation

## 2020-08-16 DIAGNOSIS — E1143 Type 2 diabetes mellitus with diabetic autonomic (poly)neuropathy: Secondary | ICD-10-CM | POA: Insufficient documentation

## 2020-08-16 DIAGNOSIS — I4891 Unspecified atrial fibrillation: Secondary | ICD-10-CM | POA: Insufficient documentation

## 2020-08-16 DIAGNOSIS — Z681 Body mass index (BMI) 19 or less, adult: Secondary | ICD-10-CM | POA: Insufficient documentation

## 2020-08-16 DIAGNOSIS — R0600 Dyspnea, unspecified: Secondary | ICD-10-CM

## 2020-08-16 DIAGNOSIS — Z8249 Family history of ischemic heart disease and other diseases of the circulatory system: Secondary | ICD-10-CM | POA: Diagnosis not present

## 2020-08-16 DIAGNOSIS — Z9049 Acquired absence of other specified parts of digestive tract: Secondary | ICD-10-CM | POA: Insufficient documentation

## 2020-08-16 DIAGNOSIS — R531 Weakness: Secondary | ICD-10-CM | POA: Diagnosis not present

## 2020-08-16 DIAGNOSIS — E039 Hypothyroidism, unspecified: Secondary | ICD-10-CM | POA: Diagnosis not present

## 2020-08-16 DIAGNOSIS — N400 Enlarged prostate without lower urinary tract symptoms: Secondary | ICD-10-CM | POA: Diagnosis not present

## 2020-08-16 DIAGNOSIS — D649 Anemia, unspecified: Secondary | ICD-10-CM | POA: Insufficient documentation

## 2020-08-16 DIAGNOSIS — F419 Anxiety disorder, unspecified: Secondary | ICD-10-CM | POA: Diagnosis not present

## 2020-08-16 DIAGNOSIS — C155 Malignant neoplasm of lower third of esophagus: Secondary | ICD-10-CM | POA: Diagnosis not present

## 2020-08-16 DIAGNOSIS — Z515 Encounter for palliative care: Secondary | ICD-10-CM

## 2020-08-16 DIAGNOSIS — I2583 Coronary atherosclerosis due to lipid rich plaque: Secondary | ICD-10-CM | POA: Diagnosis not present

## 2020-08-16 DIAGNOSIS — Z79899 Other long term (current) drug therapy: Secondary | ICD-10-CM | POA: Insufficient documentation

## 2020-08-16 DIAGNOSIS — R197 Diarrhea, unspecified: Secondary | ICD-10-CM | POA: Insufficient documentation

## 2020-08-16 DIAGNOSIS — I2510 Atherosclerotic heart disease of native coronary artery without angina pectoris: Secondary | ICD-10-CM

## 2020-08-16 DIAGNOSIS — Z8349 Family history of other endocrine, nutritional and metabolic diseases: Secondary | ICD-10-CM | POA: Insufficient documentation

## 2020-08-16 DIAGNOSIS — R630 Anorexia: Secondary | ICD-10-CM | POA: Diagnosis not present

## 2020-08-16 DIAGNOSIS — D72829 Elevated white blood cell count, unspecified: Secondary | ICD-10-CM | POA: Diagnosis not present

## 2020-08-16 DIAGNOSIS — R634 Abnormal weight loss: Secondary | ICD-10-CM | POA: Diagnosis not present

## 2020-08-16 DIAGNOSIS — M47814 Spondylosis without myelopathy or radiculopathy, thoracic region: Secondary | ICD-10-CM | POA: Diagnosis not present

## 2020-08-16 DIAGNOSIS — Z833 Family history of diabetes mellitus: Secondary | ICD-10-CM | POA: Insufficient documentation

## 2020-08-16 DIAGNOSIS — K219 Gastro-esophageal reflux disease without esophagitis: Secondary | ICD-10-CM | POA: Diagnosis not present

## 2020-08-16 DIAGNOSIS — G893 Neoplasm related pain (acute) (chronic): Secondary | ICD-10-CM | POA: Diagnosis not present

## 2020-08-16 DIAGNOSIS — I700 Atherosclerosis of aorta: Secondary | ICD-10-CM | POA: Insufficient documentation

## 2020-08-16 DIAGNOSIS — J90 Pleural effusion, not elsewhere classified: Secondary | ICD-10-CM | POA: Diagnosis not present

## 2020-08-16 DIAGNOSIS — Z841 Family history of disorders of kidney and ureter: Secondary | ICD-10-CM | POA: Insufficient documentation

## 2020-08-16 LAB — CBC WITH DIFFERENTIAL/PLATELET
Abs Immature Granulocytes: 0.09 10*3/uL — ABNORMAL HIGH (ref 0.00–0.07)
Basophils Absolute: 0.1 10*3/uL (ref 0.0–0.1)
Basophils Relative: 1 %
Eosinophils Absolute: 0.1 10*3/uL (ref 0.0–0.5)
Eosinophils Relative: 1 %
HCT: 32.1 % — ABNORMAL LOW (ref 39.0–52.0)
Hemoglobin: 9.8 g/dL — ABNORMAL LOW (ref 13.0–17.0)
Immature Granulocytes: 1 %
Lymphocytes Relative: 6 %
Lymphs Abs: 0.7 10*3/uL (ref 0.7–4.0)
MCH: 26.9 pg (ref 26.0–34.0)
MCHC: 30.5 g/dL (ref 30.0–36.0)
MCV: 88.2 fL (ref 80.0–100.0)
Monocytes Absolute: 0.6 10*3/uL (ref 0.1–1.0)
Monocytes Relative: 5 %
Neutro Abs: 11.5 10*3/uL — ABNORMAL HIGH (ref 1.7–7.7)
Neutrophils Relative %: 86 %
Platelets: 254 10*3/uL (ref 150–400)
RBC: 3.64 MIL/uL — ABNORMAL LOW (ref 4.22–5.81)
RDW: 17.7 % — ABNORMAL HIGH (ref 11.5–15.5)
WBC: 13.1 10*3/uL — ABNORMAL HIGH (ref 4.0–10.5)
nRBC: 0 % (ref 0.0–0.2)

## 2020-08-16 MED ORDER — LORAZEPAM 0.5 MG PO TABS
0.5000 mg | ORAL_TABLET | Freq: Three times a day (TID) | ORAL | 0 refills | Status: AC | PRN
Start: 2020-08-16 — End: ?

## 2020-08-16 MED ORDER — MORPHINE SULFATE (CONCENTRATE) 10 MG /0.5 ML PO SOLN
5.0000 mg | ORAL | 0 refills | Status: AC | PRN
Start: 2020-08-16 — End: ?

## 2020-08-16 NOTE — Telephone Encounter (Signed)
Called and spoke with patient's wife, it is through [facility] in [place]. Case manager notified.

## 2020-08-16 NOTE — Progress Notes (Signed)
[place]  [phone] [phone]   Name: Corey Perry Date: 08/16/2020 MRN: [id number]  DOB: 11-14-1945  Patient Care Team: Valerie Roys, DO as PCP - General (Family Medicine) [name], MD as PCP - Family Medicine (Family Medicine) [name], MD as Consulting Physician (Oncology) [name], MD as Referring Physician [name], MD as Consulting Physician (Cardiology)    REASON FOR CONSULTATION: Corey Perry is a 74 y.o. male with multiple medical problems including recurrent adenocarcinoma of the esophagus status post chemotherapy/XRT in 2017 with esophagectomy on 10/07/2016.  Patient has history of dysphagia with esophageal stricture requiring dilation.  He has had recent worsening dysphagia with weight loss.  Patient was found to have recurrent adenocarcinoma in the lower third of the esophagus now s/p FOLFOX on active surveillance.    Patient was hospitalized 05/21/2020 -05/24/2020 with sepsis from multifocal pneumonia.  Patient was referred to palliative care to help address goals and manage ongoing symptoms.  SOCIAL HISTORY:     reports that he quit smoking about 16 years ago. His smoking use included cigarettes. He has a 40.00 pack-year smoking history. He has quit using smokeless tobacco.  His smokeless tobacco use included chew. He reports current alcohol use of about 1.0 standard drink of alcohol per week. He reports that he does not use drugs.  Patient is married and lives at home with his wife.  He has a daughter who lives in [place].  Patient had a son who died in his 56E from complications of COPD.  Patient worked in a [employer] techn[employer].  ADVANCE DIRECTIVES:  Does not have  CODE STATUS: DNR/DNI (DNR form completed on 11/24/19)  PAST MEDICAL HISTORY: Past Medical History:  Diagnosis Date  . Abnormal white blood cell (WBC) count    seeing Dr. [name] at [facility] on 10/25  . Anemia   . Atrial fibrillation ([place])   . BPH (benign prostatic hypertrophy)   . Bradycardia   . CAD (coronary artery disease)   . Diabetic gastroparesis ([place]) 08/14/2020  . Dysrhythmia    bradycardia - sees Dr. Humphrey Rolls  . Esophageal cancer ([place]) 05/2016  . GERD (gastroesophageal reflux disease)   . History of kidney stones   . Hyperlipidemia   . Hypertension   . Hypothyroidism   . Kidney stone   . Myocardial infarction ([place]) 2005  . Pneumonia   . Pneumonia 06/22/2020  . S/P angioplasty with stent 2005   x 4 vessels  . Sleep apnea    mild-NO CPAP  . Spinal stenosis    lumbar    PAST SURGICAL HISTORY:  Past Surgical History:  Procedure Laterality Date  . BACK SURGERY    . CARDIAC CATHETERIZATION Left 03/12/2016   Procedure: Left Heart Cath and Coronary Angiography;  Surgeon: [name], MD;  Location: [facility];  Service: Cardiovascular;  Laterality: Left;  . CARDIAC CATHETERIZATION N/A 03/12/2016   Procedure: Coronary Stent Intervention;  Surgeon: Yolonda Kida, MD;  Location: [facility];  Service: Cardiovascular;  Laterality: N/A;  . COLONOSCOPY WITH PROPOFOL N/A 07/13/2015   Procedure: COLONOSCOPY WITH PROPOFOL;  Surgeon: Lucilla Lame, MD;  Location: [facility];  Service: Endoscopy;  Laterality: N/A;  . CORONARY ANGIOPLASTY  10/05 and 12/05   4 DES placed  . CYSTOSCOPY W/ RETROGRADES Left 06/15/2019   Procedure: CYSTOSCOPY WITH RETROGRADE PYELOGRAM;  Surgeon: Abbie Sons, MD;  Location:  [facility];  Service: Urology;  Laterality: Left;  . CYSTOSCOPY/URETEROSCOPY/HOLMIUM LASER/STENT PLACEMENT Left 06/15/2019   Procedure: CYSTOSCOPY/URETEROSCOPY/HOLMIUM LASER/STENT PLACEMENT;  Surgeon: Abbie Sons, MD;  Location: [facility];  Service: Urology;  Laterality: Left;  . ESOPHAGECTOMY  10/07/2016   @ [facility]  . ESOPHAGOGASTRODUODENOSCOPY (EGD) WITH PROPOFOL N/A 06/14/2016   Procedure: ESOPHAGOGASTRODUODENOSCOPY (EGD) WITH PROPOFOL;   Surgeon: [name], MD;  Location: [facility];  Service: Endoscopy;  Laterality: N/A;  . HERNIA REPAIR Bilateral    x3  . INGUINAL HERNIA REPAIR Right 08/28/2017   Large PerFix plug, recurrent hernia;  Surgeon: Robert Bellow, MD;  Location: [facility];  Service: General;  Laterality: Right;  recurrent  . KIDNEY STONE SURGERY  07/15/2019  . POLYPECTOMY  07/13/2015   Procedure: POLYPECTOMY;  Surgeon: Lucilla Lame, MD;  Location: [place] [facility];  Service: Endoscopy;;  . PORT-A-CATH REMOVAL N/A 01/16/2017   Procedure: REMOVAL PORT-A-CATH;  Surgeon: Olean Ree, MD;  Location: [facility];  Service: General;  Laterality: N/A;  . PORTACATH PLACEMENT N/A 07/08/2016   Procedure: INSERTION PORT-A-CATH;  Surgeon: Olean Ree, MD;  Location: [facility];  Service: General;  Laterality: N/A;  . PORTACATH PLACEMENT Right 08/30/2019   Procedure: INSERTION PORT-A-CATH;  Surgeon: Olean Ree, MD;  Location: [facility];  Service: General;  Laterality: Right;    HEMATOLOGY/ONCOLOGY HISTORY:  Oncology History  Esophageal cancer ([place])  06/23/2016 Initial Diagnosis   Esophageal cancer ([place])   09/01/2019 -  Chemotherapy   The patient had palonosetron (ALOXI) injection 0.25 mg, 0.25 mg, Intravenous,  Once, 12 of 12 cycles Administration: 0.25 mg (09/01/2019), 0.25 mg (09/22/2019), 0.25 mg (11/10/2019), 0.25 mg (11/24/2019), 0.25 mg (10/06/2019), 0.25 mg (10/20/2019), 0.25 mg (12/15/2019), 0.25 mg (01/05/2020), 0.25 mg (01/26/2020), 0.25 mg (02/16/2020), 0.25 mg (03/08/2020), 0.25 mg (03/29/2020) pegfilgrastim-cbqv (UDENYCA) injection 6 mg, 6 mg, Subcutaneous, Once, 10 of 10 cycles Administration: 6 mg (10/08/2019), 6 mg (10/22/2019), 6 mg (11/12/2019), 6 mg (12/17/2019), 6 mg (01/07/2020), 6 mg (01/28/2020), 6 mg (02/18/2020), 6 mg (03/10/2020), 6 mg (03/31/2020) leucovorin 700 mg in dextrose 5 % 250 mL infusion, 704 mg, Intravenous,  Once, 12 of 12 cycles Administration: 700 mg (09/01/2019), 700 mg (11/10/2019), 700 mg  (11/24/2019), 700 mg (10/06/2019), 700 mg (10/20/2019), 700 mg (12/15/2019), 700 mg (01/05/2020), 700 mg (01/26/2020), 700 mg (02/16/2020), 700 mg (03/08/2020), 700 mg (03/29/2020) oxaliplatin (ELOXATIN) 150 mg in dextrose 5 % 500 mL chemo infusion, 85 mg/m2 = 150 mg, Intravenous,  Once, 12 of 12 cycles Administration: 150 mg (09/01/2019), 150 mg (09/22/2019), 150 mg (11/10/2019), 150 mg (11/24/2019), 150 mg (10/06/2019), 150 mg (10/20/2019), 150 mg (12/15/2019), 150 mg (01/05/2020), 150 mg (01/26/2020), 150 mg (02/16/2020), 150 mg (03/08/2020), 150 mg (03/29/2020) fluorouracil (ADRUCIL) chemo injection 700 mg, 400 mg/m2 = 700 mg, Intravenous,  Once, 12 of 12 cycles Administration: 700 mg (09/01/2019), 700 mg (09/22/2019), 700 mg (11/10/2019), 700 mg (11/24/2019), 700 mg (10/06/2019), 700 mg (10/20/2019), 700 mg (12/15/2019), 700 mg (01/05/2020), 700 mg (01/26/2020), 700 mg (02/16/2020), 700 mg (03/08/2020), 700 mg (03/29/2020) fluorouracil (ADRUCIL) 4,200 mg in sodium chloride 0.9 % 66 mL chemo infusion, 2,400 mg/m2 = 4,200 mg, Intravenous, 1 Day/Dose, 12 of 12 cycles Administration: 4,200 mg (09/01/2019), 4,200 mg (09/22/2019), 4,200 mg (11/10/2019), 4,200 mg (11/24/2019), 4,200 mg (10/06/2019), 4,200 mg (10/20/2019), 4,200 mg (12/15/2019), 4,200 mg (01/05/2020), 4,200 mg (01/26/2020), 4,200 mg (02/16/2020), 4,200 mg (03/08/2020), 4,200 mg (03/29/2020)  for chemotherapy treatment.      ALLERGIES:  has No Known Allergies.  MEDICATIONS:  Current Outpatient Medications  Medication Sig Dispense Refill  . acetaminophen (TYLENOL) 500 MG tablet Take 500 mg by mouth every 6 (six) hours as needed for mild pain or moderate pain.    Marland Kitchen amoxicillin-clavulanate (AUGMENTIN) 875-125 MG tablet Take 1 tablet by mouth 2 (two) times daily for 5 days. 10 tablet 0  . aspirin 81 MG tablet Take 81 mg by mouth daily.     Marland Kitchen atorvastatin (LIPITOR) 40 MG tablet Take 1 tablet (40 mg total) by mouth daily. 90 tablet 1  . clopidogrel (PLAVIX) 75 MG tablet Take 1 tablet (75 mg  total) by mouth daily. 90 tablet 3  . diphenoxylate-atropine (LOMOTIL) 2.5-0.025 MG tablet Take 2 tablets by mouth 4 (four) times daily as needed for diarrhea or loose stools. 30 tablet 0  . erythromycin (E-MYCIN) 250 MG tablet Take 250 mg by mouth 4 (four) times daily.    . feeding supplement, ENSURE ENLIVE, (ENSURE ENLIVE) LIQD Take 237 mLs by mouth 2 (two) times daily between meals. 237 mL 12  . levothyroxine (SYNTHROID) 50 MCG tablet Take 1 tablet (50 mcg total) by mouth daily. 90 tablet 1  . loperamide (IMODIUM A-D) 2 MG tablet Take 2 mg by mouth 4 (four) times daily as needed for diarrhea or loose stools.     . metoprolol succinate (TOPROL-XL) 25 MG 24 hr tablet Take 0.5 tablets (12.5 mg total) by mouth daily. 90 tablet 1  . multivitamin-iron-minerals-folic acid (CENTRUM) chewable tablet Chew 1 tablet by mouth daily.    . nitroGLYCERIN (NITROSTAT) 0.4 MG SL tablet Place 1 tablet under the tongue every 5 (five) minutes as needed for chest pain.   98  . ondansetron (ZOFRAN) 8 MG tablet Take 1 tablet (8 mg total) by mouth every 8 (eight) hours as needed for nausea. 60 tablet 2  . pantoprazole (PROTONIX) 20 MG tablet Take 20 mg by mouth daily.    . Probiotic Product ([place]) CAPS Take 1 capsule by mouth in the morning. (Patient not taking: Reported on 08/16/2020)     No current facility-administered medications for this visit.    VITAL SIGNS: There were no vitals taken for this visit. There were no vitals filed for this visit.  Estimated body mass index is 19.09 kg/m as calculated from the following:   Height as of 08/09/20: 5\' 9"  (1.753 m).   Weight as of an earlier encounter on 08/16/20: 129 lb 4 oz (58.6 kg).  LABS: CBC:    Component Value Date/Time   WBC 13.1 (H) 08/16/2020 1034   HGB 9.8 (L) 08/16/2020 1034   HGB 9.2 (L) 07/25/2020 1403   HCT 32.1 (L) 08/16/2020 1034   HCT 30.1 (L) 07/25/2020 1403   PLT 254 08/16/2020 1034   PLT 222 07/25/2020 1403   MCV 88.2  08/16/2020 1034   MCV 91 07/25/2020 1403   NEUTROABS 11.5 (H) 08/16/2020 1034   NEUTROABS 5.3 07/25/2020 1403   LYMPHSABS 0.7 08/16/2020 1034   LYMPHSABS 0.8 07/25/2020 1403   MONOABS 0.6 08/16/2020 1034   EOSABS 0.1 08/16/2020 1034   EOSABS 0.1 07/25/2020 1403   BASOSABS 0.1 08/16/2020 1034   BASOSABS 0.0 07/25/2020 1403   Comprehensive Metabolic Panel:    Component Value Date/Time   NA 136 08/14/2020 0505   NA 140 07/25/2020 1403   K 3.3 (L) 08/14/2020 0505   CL 104 08/14/2020 0505   CO2 26 08/14/2020 0505   BUN 18 08/14/2020 0505   BUN 26 07/25/2020 1403  CREATININE 1.07 08/14/2020 0505   GLUCOSE 80 08/14/2020 0505   CALCIUM 7.7 (L) 08/14/2020 0505   AST 31 08/12/2020 0344   AST 31 03/15/2019 1022   ALT 20 08/12/2020 0344   ALT 28 03/15/2019 1022   ALKPHOS 64 08/12/2020 0344   BILITOT 0.5 08/12/2020 0344   BILITOT 0.4 07/25/2020 1403   PROT 6.4 (L) 08/12/2020 0344   PROT 7.8 07/25/2020 1403   ALBUMIN 1.7 (L) 08/12/2020 0344   ALBUMIN 3.0 (L) 07/25/2020 1403    RADIOGRAPHIC STUDIES: DG Chest 2 View  Result Date: 07/26/2020 CLINICAL DATA:  Multifocal pneumonia, productive cough. EXAM: CHEST - 2 VIEW COMPARISON:  July 10, 2020. FINDINGS: The heart size and mediastinal contours are within normal limits. No pneumothorax is noted. Right subclavian Port-A-Cath is unchanged in position. Stable multiple opacities are noted throughout both lungs consistent with multifocal pneumonia. Small right pleural effusion is noted. The visualized skeletal structures are unremarkable. IMPRESSION: Stable bilateral multifocal pneumonia. Electronically Signed   By: Marijo Conception M.D.   On: 07/26/2020 08:59   CT ANGIO CHEST PE W OR WO CONTRAST  Result Date: 08/13/2020 CLINICAL DATA:  Shortness of breath. Previous surgery for esophageal carcinoma EXAM: CT ANGIOGRAPHY CHEST WITH CONTRAST TECHNIQUE: Multidetector CT imaging of the chest was performed using the standard protocol during  bolus administration of intravenous contrast. Multiplanar CT image reconstructions and MIPs were obtained to evaluate the vascular anatomy. CONTRAST:  78mL OMNIPAQUE IOHEXOL 350 MG/ML SOLN COMPARISON:  August 09, 2020 chest CT FINDINGS: Cardiovascular: There is no demonstrable pulmonary embolus. There is no thoracic aortic aneurysm or dissection. Minimal calcification noted at the origin of the left common carotid artery. No other great vessel calcification noted. There is aortic atherosclerosis. There are foci of coronary artery calcification. There is no appreciable pericardial effusion or pericardial thickening. Port-A-Cath tip in superior vena cava. Mediastinum/Nodes: Thyroid appears unremarkable. There is no appreciable thoracic adenopathy by size criteria. Occasional subcentimeter mediastinal lymph nodes do not meet size criteria for pathologic significance. Patient is undergone previous gastric swing through procedure with stomach above the diaphragm. Moderate food material noted within the stomach. Lungs/Pleura: Moderate free-flowing pleural effusion on the left noted. Minimal pleural effusion on the right. There is airspace consolidation throughout portions of the right middle and lower lobes as well as in portions of the right upper lobe. There has been progression of infiltrate in the right upper lobe compared to 4 days prior. Multiple patchy areas of opacity noted throughout the left lung with consolidation in portions of the left lower lobe, stable. Upper Abdomen: Visualized upper abdominal structures appear unremarkable. Musculoskeletal: No blastic or lytic bone lesions. There are foci of degenerative change in the thoracic spine. No chest wall lesions beyond port present anteriorly on the right. Review of the MIP images confirms the above findings. IMPRESSION: 1. No demonstrable pulmonary embolus. No thoracic aortic aneurysm or dissection. Foci of atherosclerosis in the aorta and foci of coronary  artery calcification noted. 2. Status post gastric swing through procedure with stomach above the diaphragm. 3. Moderate left pleural effusion with minimal right pleural effusion. Multifocal areas of pneumonia with overall progression in the right upper lobe compared to recent study. Changes similar elsewhere. Given somewhat patchy airspace opacity in portions of the upper lobes, check of COVID-19 status advised. Most of the pneumonia shows consolidation is felt to likely have bacterial etiology. Note that a degree of underlying metastatic disease could present with opacity similar to those seen in the  upper lobe regions. 4.  No adenopathy appreciable. Aortic Atherosclerosis (ICD10-I70.0). Electronically Signed   By: Lowella Grip III M.D.   On: 08/13/2020 11:30   CT CHEST ABDOMEN PELVIS W CONTRAST  Result Date: 08/09/2020 CLINICAL DATA:  Nausea and vomiting in a patient with known esophageal cancer. EXAM: CT CHEST, ABDOMEN, AND PELVIS WITH CONTRAST TECHNIQUE: Multidetector CT imaging of the chest, abdomen and pelvis was performed following the standard protocol during bolus administration of intravenous contrast. CONTRAST:  84mL OMNIPAQUE IOHEXOL 300 MG/ML  SOLN COMPARISON:  Multiple prior studies including CT of the abdomen and pelvis from April 22, 2019 FINDINGS: CT CHEST FINDINGS Cardiovascular: Three-vessel coronary artery disease. Heart size is stable. Aortic caliber is stable at approximately 3.6 cm. RIGHT-sided Port-A-Cath terminates in the mid superior vena cava as on the recent chest CT from 05/21/2020. Central pulmonary vasculature is unremarkable on venous phase assessment. Mediastinum/Nodes: Post esophagectomy with gastric pull-through. Patulous appearance of the pull-through which is filled partially with oral contrast media. No adenopathy in the chest. Lungs/Pleura: Numerous areas of airspace disease with nodular features and patchy areas of ground-glass. This is worse than on the study of  May 21, 2020 now with involvement in the LEFT chest as well as the RIGHT and with more extensive involvement in the RIGHT chest when compared to the prior study. There is also slight interval enlargement of a very small RIGHT pleural effusion and interval development of a LEFT pleural effusion since the prior study. Musculoskeletal: See below for full musculoskeletal details. CT ABDOMEN PELVIS FINDINGS Hepatobiliary: No focal, suspicious hepatic lesion. The portal vein is patent. Gallbladder is decompressed. No biliary duct dilation. Pancreas: Soft tissue along the posterior pancreas may have diminished in size but obliterates the normal fat about the celiac axis similar to distribution seen on prior study but again perhaps diminished in size. No intrapancreatic lesion or signs of inflammation. Spleen: Normal Adrenals/Urinary Tract: Normal adrenal glands. Symmetric renal enhancement. No hydronephrosis. The LEFT renal pelvic calculus has migrated slightly further into the renal pelvis measuring approximately 8 mm. Increasing stone burden in the LEFT kidney since previous imaging with another calculus in the interpolar calyx and infundibulum approximately 5 mm. Distal ureters are difficult to assess. Urinary bladder is under distended. Stomach/Bowel: Soft tissue about the hepatic gastric recess measuring approximately 3.4 x 2.1 cm in axial dimension. Less bulky appearance along the LEFT periaortic region where it previously measured approximately 2.3 cm in greatest thickness and now measures 1.3 cm. Greatest maximal dimension of soft tissue on the prior study was approximately 3.9 by 2.6 cm. No sign of small bowel obstruction. The ingested contrast material passes through the small bowel with early opacification of the cecum which is present in the pelvis. Stool filled colon with signs of colonic diverticulosis. Ascending colonic thickening colonic thickening. Vascular/Lymphatic: Calcified atheromatous plaque in  the abdominal aorta. Aneurysmal dilation of the infrarenal abdominal aorta approximately 2.9 cm greatest caliber similar to the prior study accounting for tortuosity in the axial plane. Reproductive: Prostate heterogeneous on CT with nonspecific appearance. Other: Diffuse loss of subcutaneous fat and mesenteric fat since the prior imaging evaluation makes assessment more difficult. Appendix not visualized without overt signs of acute appendicitis. Musculoskeletal: No acute musculoskeletal process. Spinal degenerative changes. IMPRESSION: 1. Findings of bilateral pneumonia with worsening compared to previous imaging. The possibility of background pulmonary metastatic disease is also considered based on previous PET imaging. There is rapid worsening in some areas, particularly the RIGHT upper lobe and LEFT  lung base when compared to the previous more recent PET scan which would support a worsening overlay of infection perhaps from aspiration. 2. Ascending colonic thickening may be related to colitis, correlate with any risk factors for infectious colitis in the setting of recent antibiotic administration. 3. Post esophagectomy with gastric pull-through. Patulous appearance of the pull-through which is filled partially with oral contrast media further supporting the possibility of aspiration in the setting of repeated nausea and vomiting. 4. Decreased soft tissue is suggested in the upper abdomen with limited assessment due to cachexia and early venous phase on the current study. 5. filling of the cecum with ingested contrast material, cecum now in the pelvis. Appendix not visualized. No secondary signs to suggest acute appendicitis. Assessment is however limited due to lack of intra-abdominal fat. If there is continued pain or symptoms that localized to the pelvis, repeat scanning could be considered after passage of oral contrast further into the colon. 6. Increased stone burden in the LEFT kidney. 7. Aneurysmal  dilation of the infrarenal abdominal aorta approximately 2.9 cm greatest caliber similar to the prior study accounting for tortuosity in the axial plane. 8. Cachexia. Aortic Atherosclerosis (ICD10-I70.0). Electronically Signed   By: Zetta Bills M.D.   On: 08/09/2020 19:09   NM PET Image Initial (PI) Skull Base To Thigh  Result Date: 07/27/2020 CLINICAL DATA:  Subsequent treatment strategy for esophageal cancer. EXAM: NUCLEAR MEDICINE PET SKULL BASE TO THIGH TECHNIQUE: 6.2 mCi F-18 FDG was injected intravenously. Full-ring PET imaging was performed from the skull base to thigh after the radiotracer. CT data was obtained and used for attenuation correction and anatomic localization. Fasting blood glucose: 75 mg/dl COMPARISON:  CT 05/21/2020, PET-CT 04/27/2020, PET-CT 12/07/2019 FINDINGS: Mediastinal blood pool activity: SUV max 1.6 Liver activity: SUV max 2.2 NECK: No hypermetabolic lymph nodes in the neck. Incidental CT findings: none CHEST: Progressive bilateral nodular foci with interstitial thickening within the LEFT and RIGHT lower lobe and progressing to the RIGHT upper lobe and inferior lingula over comparison exams from April 27, 2020 and May 21, 2020. These nodules reach consolidation within the RIGHT lower lobe. These nodules have associated metabolic activity. For example cluster of nodules in the RIGHT upper lobe with SUV max equal 4.2. LEFT lingular nodule thickening with SUV max equal 2.9. In the medial RIGHT lower lobe nodular focus with SUV max equal 5.5. Mild hypermetabolic lower paratracheal nodes. For example LEFT lower paratracheal node SUV max equal 3.2. Incidental CT findings: A gastric pull-up anatomy. The stomach is fluid-filled. ABDOMEN/PELVIS: Focus of metabolic activity midline in the upper abdomen there is poorly defined on the noncontrast CT with SUV max equal 3.7. No abnormal activity liver. No hypermetabolic abdominopelvic lymph nodes. Incidental CT findings: Little  intra-abdominal fat makes evaluation difficult. SKELETON: No focal hypermetabolic activity to suggest skeletal metastasis. Incidental CT findings: none IMPRESSION: 1. Bilateral progressive interstitial thickening, pulmonary nodules and coalescence of nodularity with associated hypermetabolic activity. Differential includes chronic progressive pulmonary infection versus malignancy including lymphangitic spread of carcinoma. Consider bronchoscopy and tissue sampling. 2. Mild mediastinal adenopathy with differential include reactive versus metastatic adenopathy. 3. Large volume of fluid within the gastric pull up. This could indicate risk for aspiration pneumonitis providing source of infection for the pulmonary findings. 4. No clear evidence of metastatic disease in the abdomen pelvis. Electronically Signed   By: Suzy Bouchard M.D.   On: 07/27/2020 15:14   DG Abdomen Acute W/Chest  Result Date: 08/09/2020 CLINICAL DATA:  Vomiting for 10  days EXAM: DG ABDOMEN ACUTE WITH 1 VIEW CHEST COMPARISON:  04/01/2019, 04/22/2019 FINDINGS: There is no evidence of dilated bowel loops or free intraperitoneal air. 8 mm rounded calcification projects in the region of the left renal shadow. Tortuous abdominal aorta with atherosclerotic calcifications. Stable right-sided chest port. Cardiomediastinal contours are within normal limits. Worsening bilateral airspace opacities, most confluent within the right upper lobe and left lower lobe. No large pleural fluid collection. No pneumothorax. IMPRESSION: 1. Worsening multifocal pneumonia. 2. Nonobstructive bowel gas pattern. 3. 8 mm calcification projects in the region of the left renal shadow. Electronically Signed   By: Davina Poke D.O.   On: 08/09/2020 16:28    PERFORMANCE STATUS (ECOG) : 1 - Symptomatic but completely ambulatory  Review of Systems Unless otherwise noted, a complete review of systems is negative.  Physical Exam General: NAD Pulmonary: on o2, labored  with exertion Extremities: no edema, no joint deformities Skin: no rashes Neurological: Weakness but otherwise nonfocal  IMPRESSION: Post hospital follow-up.  Patient was hospitalized 08/09/2020-08/14/2020 with aspiration pneumonia.  He had recent esophageal dilation at [facility] and reportedly biopsy did not show recurrence of cancer.  Patient decided to discharge home under hospice.  He is pending hospice admission tomorrow afternoon with [facility].   Symptomatically, he endorses exertional dyspnea.  We discussed dyspnea management.  We will start him on as needed morphine elixir and lorazepam.  We discussed hospice care in detail.  Again, they are already scheduled to admit him to services tomorrow afternoon at home.  Patient is DNR/DNI   PLAN: -Best supportive care -Hospice care at home -Start morphine elixir and lorazepam as needed for pain/dyspnea/anxiety -DNR/DNI -RTC in 6 weeks  Case and plan discussed with Dr. [name]   Patient expressed understanding and was in agreement with this plan. He also understands that He can call the clinic at any time with any questions, concerns, or complaints.     Time Total: 15 minutes  Visit consisted of counseling and education dealing with the complex and emotionally intense issues of symptom management and palliative care in the setting of serious and potentially life-threatening illness.Greater than 50%  of this time was spent counseling and coordinating care related to the above assessment and plan.  Signed by: Altha Harm, PhD, NP-C

## 2020-08-17 DIAGNOSIS — J690 Pneumonitis due to inhalation of food and vomit: Secondary | ICD-10-CM | POA: Diagnosis not present

## 2020-08-17 DIAGNOSIS — R001 Bradycardia, unspecified: Secondary | ICD-10-CM | POA: Diagnosis not present

## 2020-08-17 DIAGNOSIS — D630 Anemia in neoplastic disease: Secondary | ICD-10-CM | POA: Diagnosis not present

## 2020-08-17 DIAGNOSIS — Z9981 Dependence on supplemental oxygen: Secondary | ICD-10-CM | POA: Diagnosis not present

## 2020-08-17 DIAGNOSIS — R64 Cachexia: Secondary | ICD-10-CM | POA: Diagnosis not present

## 2020-08-17 DIAGNOSIS — C155 Malignant neoplasm of lower third of esophagus: Secondary | ICD-10-CM | POA: Diagnosis not present

## 2020-08-17 DIAGNOSIS — Z87891 Personal history of nicotine dependence: Secondary | ICD-10-CM | POA: Diagnosis not present

## 2020-08-17 DIAGNOSIS — E43 Unspecified severe protein-calorie malnutrition: Secondary | ICD-10-CM | POA: Diagnosis not present

## 2020-08-17 DIAGNOSIS — I4891 Unspecified atrial fibrillation: Secondary | ICD-10-CM | POA: Diagnosis not present

## 2020-08-17 DIAGNOSIS — I252 Old myocardial infarction: Secondary | ICD-10-CM | POA: Diagnosis not present

## 2020-08-17 DIAGNOSIS — E785 Hyperlipidemia, unspecified: Secondary | ICD-10-CM | POA: Diagnosis not present

## 2020-08-17 DIAGNOSIS — I10 Essential (primary) hypertension: Secondary | ICD-10-CM | POA: Diagnosis not present

## 2020-08-17 DIAGNOSIS — K219 Gastro-esophageal reflux disease without esophagitis: Secondary | ICD-10-CM | POA: Diagnosis not present

## 2020-08-17 DIAGNOSIS — Z9221 Personal history of antineoplastic chemotherapy: Secondary | ICD-10-CM | POA: Diagnosis not present

## 2020-08-17 DIAGNOSIS — G4733 Obstructive sleep apnea (adult) (pediatric): Secondary | ICD-10-CM | POA: Diagnosis not present

## 2020-08-17 DIAGNOSIS — Z923 Personal history of irradiation: Secondary | ICD-10-CM | POA: Diagnosis not present

## 2020-08-17 DIAGNOSIS — D6959 Other secondary thrombocytopenia: Secondary | ICD-10-CM | POA: Diagnosis not present

## 2020-08-17 DIAGNOSIS — E8809 Other disorders of plasma-protein metabolism, not elsewhere classified: Secondary | ICD-10-CM | POA: Diagnosis not present

## 2020-08-17 DIAGNOSIS — K3184 Gastroparesis: Secondary | ICD-10-CM | POA: Diagnosis not present

## 2020-08-17 DIAGNOSIS — I25119 Atherosclerotic heart disease of native coronary artery with unspecified angina pectoris: Secondary | ICD-10-CM | POA: Diagnosis not present

## 2020-08-17 DIAGNOSIS — R918 Other nonspecific abnormal finding of lung field: Secondary | ICD-10-CM | POA: Diagnosis not present

## 2020-08-17 DIAGNOSIS — R1310 Dysphagia, unspecified: Secondary | ICD-10-CM | POA: Diagnosis not present

## 2020-08-17 DIAGNOSIS — J9611 Chronic respiratory failure with hypoxia: Secondary | ICD-10-CM | POA: Diagnosis not present

## 2020-08-17 DIAGNOSIS — K529 Noninfective gastroenteritis and colitis, unspecified: Secondary | ICD-10-CM | POA: Diagnosis not present

## 2020-08-17 DIAGNOSIS — I700 Atherosclerosis of aorta: Secondary | ICD-10-CM | POA: Diagnosis not present

## 2020-08-18 ENCOUNTER — Institutional Professional Consult (permissible substitution): Payer: Medicare Other | Admitting: Internal Medicine

## 2020-08-18 DIAGNOSIS — C155 Malignant neoplasm of lower third of esophagus: Secondary | ICD-10-CM | POA: Diagnosis not present

## 2020-08-18 DIAGNOSIS — K3184 Gastroparesis: Secondary | ICD-10-CM | POA: Diagnosis not present

## 2020-08-18 DIAGNOSIS — Z9221 Personal history of antineoplastic chemotherapy: Secondary | ICD-10-CM | POA: Diagnosis not present

## 2020-08-18 DIAGNOSIS — Z923 Personal history of irradiation: Secondary | ICD-10-CM | POA: Diagnosis not present

## 2020-08-18 DIAGNOSIS — R1310 Dysphagia, unspecified: Secondary | ICD-10-CM | POA: Diagnosis not present

## 2020-08-18 DIAGNOSIS — K219 Gastro-esophageal reflux disease without esophagitis: Secondary | ICD-10-CM | POA: Diagnosis not present

## 2020-08-22 DIAGNOSIS — C155 Malignant neoplasm of lower third of esophagus: Secondary | ICD-10-CM | POA: Diagnosis not present

## 2020-08-22 DIAGNOSIS — R1310 Dysphagia, unspecified: Secondary | ICD-10-CM | POA: Diagnosis not present

## 2020-08-22 DIAGNOSIS — Z923 Personal history of irradiation: Secondary | ICD-10-CM | POA: Diagnosis not present

## 2020-08-22 DIAGNOSIS — K219 Gastro-esophageal reflux disease without esophagitis: Secondary | ICD-10-CM | POA: Diagnosis not present

## 2020-08-22 DIAGNOSIS — K3184 Gastroparesis: Secondary | ICD-10-CM | POA: Diagnosis not present

## 2020-08-22 DIAGNOSIS — Z9221 Personal history of antineoplastic chemotherapy: Secondary | ICD-10-CM | POA: Diagnosis not present

## 2020-08-23 ENCOUNTER — Telehealth: Payer: Self-pay

## 2020-08-23 DIAGNOSIS — Z923 Personal history of irradiation: Secondary | ICD-10-CM | POA: Diagnosis not present

## 2020-08-23 DIAGNOSIS — C155 Malignant neoplasm of lower third of esophagus: Secondary | ICD-10-CM | POA: Diagnosis not present

## 2020-08-23 DIAGNOSIS — R1310 Dysphagia, unspecified: Secondary | ICD-10-CM | POA: Diagnosis not present

## 2020-08-23 DIAGNOSIS — Z9221 Personal history of antineoplastic chemotherapy: Secondary | ICD-10-CM | POA: Diagnosis not present

## 2020-08-23 DIAGNOSIS — K219 Gastro-esophageal reflux disease without esophagitis: Secondary | ICD-10-CM | POA: Diagnosis not present

## 2020-08-23 DIAGNOSIS — K3184 Gastroparesis: Secondary | ICD-10-CM | POA: Diagnosis not present

## 2020-08-23 NOTE — Telephone Encounter (Signed)
Routing to provider. Would you be ok with doing a virtual for this patient?

## 2020-08-23 NOTE — Telephone Encounter (Signed)
Copied from [place] [id number]. Topic: Quick Communication - See Telephone Encounter >> Aug 23, 2020  2:52 PM Loma Boston wrote: CRM for notification. See Telephone encounter for: 08/23/20. Pt has appt 12/9 with Dr Lenna Sciara.  Pt wife, Meryl Crutch wants phone visit instead as pt not able to come in. Pt is not doing very well. Definetly want appt,lots of questions  will put on speaker. Call [phone]  Can this be a a virtual?

## 2020-08-24 ENCOUNTER — Ambulatory Visit (INDEPENDENT_AMBULATORY_CARE_PROVIDER_SITE_OTHER): Payer: Medicare Other | Admitting: Family Medicine

## 2020-08-24 DIAGNOSIS — K3184 Gastroparesis: Secondary | ICD-10-CM | POA: Diagnosis not present

## 2020-08-24 DIAGNOSIS — E1143 Type 2 diabetes mellitus with diabetic autonomic (poly)neuropathy: Secondary | ICD-10-CM | POA: Diagnosis not present

## 2020-08-24 DIAGNOSIS — C155 Malignant neoplasm of lower third of esophagus: Secondary | ICD-10-CM | POA: Diagnosis not present

## 2020-08-24 DIAGNOSIS — J9621 Acute and chronic respiratory failure with hypoxia: Secondary | ICD-10-CM | POA: Diagnosis not present

## 2020-08-24 DIAGNOSIS — Z515 Encounter for palliative care: Secondary | ICD-10-CM | POA: Diagnosis not present

## 2020-08-24 DIAGNOSIS — J690 Pneumonitis due to inhalation of food and vomit: Secondary | ICD-10-CM | POA: Diagnosis not present

## 2020-08-24 NOTE — Telephone Encounter (Signed)
Absolutely.

## 2020-08-24 NOTE — Progress Notes (Signed)
There were no vitals taken for this visit.   Subjective:    Patient ID: Corey Perry, male    DOB: 1946/03/06, 74 y.o.   MRN: [id number]  HPI: Corey Perry is a 74 y.o. male  Chief Complaint  Patient presents with  . Hospitalization Follow-up   Transition of Care Hospital Follow up.   Hospital/Facility: [facility] Physician: Dr. Doristine Bosworth D/C Date: 08/14/20  Records Requested: 08/24/20 Records Received:  08/24/20 Records Reviewed:  08/24/20  Diagnoses on Discharge: Acid reflux   Acute on chronic respiratory failure with hypoxia (HCC)   Vomiting   Dehydration   Aspiration pneumonia of both lungs (HCC)   Weight loss, non-intentional   Goals of care, counseling/discussion   Palliative care by specialist   DNR (do not resuscitate)   Diabetic gastroparesis ([place])  Date of interactive Contact within 48 hours of discharge: 08/14/20 Contact was through: direct  Date of 7 day or 14 day face-to-face visit: 08/24/20   within 14 days  Outpatient Encounter Medications as of 08/24/2020  Medication Sig Note  . acetaminophen (TYLENOL) 500 MG tablet Take 500 mg by mouth every 6 (six) hours as needed for mild pain or moderate pain.   Marland Kitchen aspirin 81 MG tablet Take 81 mg by mouth daily.    Marland Kitchen atorvastatin (LIPITOR) 40 MG tablet Take 1 tablet (40 mg total) by mouth daily.   . clopidogrel (PLAVIX) 75 MG tablet Take 1 tablet (75 mg total) by mouth daily. 08/10/2020: Being held since 07/26/20, for procedure  . diphenoxylate-atropine (LOMOTIL) 2.5-0.025 MG tablet Take 2 tablets by mouth 4 (four) times daily as needed for diarrhea or loose stools.   Marland Kitchen erythromycin (E-MYCIN) 250 MG tablet Take 250 mg by mouth 4 (four) times daily.   . feeding supplement, ENSURE ENLIVE, (ENSURE ENLIVE) LIQD Take 237 mLs by mouth 2 (two) times daily between meals.   Marland Kitchen levothyroxine (SYNTHROID) 50 MCG tablet Take 1 tablet (50 mcg total) by mouth daily.   Marland Kitchen loperamide (IMODIUM A-D) 2 MG tablet Take 2 mg by mouth 4  (four) times daily as needed for diarrhea or loose stools.    Marland Kitchen LORazepam (ATIVAN) 0.5 MG tablet Take 1 tablet (0.5 mg total) by mouth every 8 (eight) hours as needed for anxiety.   . metoprolol succinate (TOPROL-XL) 25 MG 24 hr tablet Take 0.5 tablets (12.5 mg total) by mouth daily.   . Morphine Sulfate (MORPHINE CONCENTRATE) 10 mg / 0.5 ml concentrated solution Take 0.25 mLs (5 mg total) by mouth every 2 (two) hours as needed for severe pain or shortness of breath.   . multivitamin-iron-minerals-folic acid (CENTRUM) chewable tablet Chew 1 tablet by mouth daily.   . nitroGLYCERIN (NITROSTAT) 0.4 MG SL tablet Place 1 tablet under the tongue every 5 (five) minutes as needed for chest pain.    Marland Kitchen ondansetron (ZOFRAN) 8 MG tablet Take 1 tablet (8 mg total) by mouth every 8 (eight) hours as needed for nausea.   . pantoprazole (PROTONIX) 20 MG tablet Take 20 mg by mouth daily.   . Probiotic Product ([place]) CAPS Take 1 capsule by mouth in the morning. (Patient not taking: Reported on 08/16/2020)    No facility-administered encounter medications on file as of 08/24/2020.  Per Hospitalist:Brief/Interim Summary: Corey Chaput Williamsonis a 73 y.o.malewith medical history significant formultiple medical problems came to ER due to nausea vomiting and problem swallowing. No other complaint.He endorses baseline shortness of breath that was unchanged.He reports that the symptoms  are baseline for him. Has a history of esophageal dilatation procedure on 08/03/2020, he does feel better than prior to the procedure.  Lives with spouse. 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 He was a former tobacco user (started smoking at age 81, quit 2005), 4 stents at [facility] in 2005 and one stent in 2017. Endorses infrequent ETOH use (1/2 glass of whiskey per week).  Patient was admitted under hospitalist service due to multiple problems including nausea, vomiting, dehydration and  acute on chronic hypoxic respiratory failure secondary to aspiration pneumonia.  He was started on broad-spectrum IV antibiotics, initially on Zosyn and then switched to Unasyn.  He was requiring about 4 L of oxygen.  Subsequently he was weaned down to 2 L which is his baseline.  GI was consulted.  They opined that patient's symptoms were secondary to gastroparesis.  Patient was then started on erythromycin and his symptoms improved.  Reportedly, patient was recently started on natamycin as outpatient but he had not started taking that.  He is a strongly recommended to take that.  CT abdomen and pelvis was also done which showed ascending colitis.  He was tested negative for C. difficile.  Patient was medically ready for discharge however then he developed hypotension.  Requiring couple of liters of IV fluid bolus and then subsequent maintenance fluids.  This resolved his hypotension.  His blood pressure has been back to his normal/baseline since last 2 days but then his hemoglobin dropped to6.8on 08/12/2020. He was transfused 1 unit of PRBC. Iron studies indicate anemia of chronic disease. FOBT negative.  Posttransfusion, patient started having some hemoptysis as well as epistasis.  CT angiogram of the chest was done and he was ruled out of PE.  Due to concern of possible hematemesis, GI was reconsulted however they opined that patient had hemoptysis and not hematemesis and thus did not recommend any further intervention and signed off.  Patient's epistasis and hemoptysis stopped on its own and it has been about 24 hours that he has not had any symptoms like that.  His hemoglobin improved after transfusion.  Patient was also seen by palliative care.  He is DNR but he was not ready to be hospice.  GI/Dr. Haig Prophet discussed with the patient and his wife, he agreed to go home with hospice.  Social worker is aware and is arranging hospice.  He is being discharged today.  I will prescribe him 5 more days of oral  Augmentin for aspiration pneumonia."  Diagnostic Tests Reviewed: CLINICAL DATA:  Vomiting for 10 days  EXAM: DG ABDOMEN ACUTE WITH 1 VIEW CHEST  COMPARISON:  04/01/2019, 04/22/2019  FINDINGS: There is no evidence of dilated bowel loops or free intraperitoneal air. 8 mm rounded calcification projects in the region of the left renal shadow. Tortuous abdominal aorta with atherosclerotic calcifications.  Stable right-sided chest port. Cardiomediastinal contours are within normal limits. Worsening bilateral airspace opacities, most confluent within the right upper lobe and left lower lobe. No large pleural fluid collection. No pneumothorax.  IMPRESSION: 1. Worsening multifocal pneumonia. 2. Nonobstructive bowel gas pattern. 3. 8 mm calcification projects in the region of the left renal Shadow.  CLINICAL DATA:  Nausea and vomiting in a patient with known esophageal cancer.  EXAM: CT CHEST, ABDOMEN, AND PELVIS WITH CONTRAST  TECHNIQUE: Multidetector CT imaging of the chest, abdomen and pelvis was performed following the standard protocol during bolus administration of intravenous contrast.  CONTRAST:  68mL OMNIPAQUE IOHEXOL 300 MG/ML  SOLN  COMPARISON:  Multiple prior studies including CT of the abdomen and pelvis from April 22, 2019  FINDINGS: CT CHEST FINDINGS  Cardiovascular: Three-vessel coronary artery disease. Heart size is stable. Aortic caliber is stable at approximately 3.6 cm.  RIGHT-sided Port-A-Cath terminates in the mid superior vena cava as on the recent chest CT from 05/21/2020.  Central pulmonary vasculature is unremarkable on venous phase assessment.  Mediastinum/Nodes: Post esophagectomy with gastric pull-through. Patulous appearance of the pull-through which is filled partially with oral contrast media. No adenopathy in the chest.  Lungs/Pleura: Numerous areas of airspace disease with nodular features and patchy areas of  ground-glass. This is worse than on the study of May 21, 2020 now with involvement in the LEFT chest as well as the RIGHT and with more extensive involvement in the RIGHT chest when compared to the prior study. There is also slight interval enlargement of a very small RIGHT pleural effusion and interval development of a LEFT pleural effusion since the prior study.  Musculoskeletal: See below for full musculoskeletal details.  CT ABDOMEN PELVIS FINDINGS  Hepatobiliary: No focal, suspicious hepatic lesion. The portal vein is patent. Gallbladder is decompressed. No biliary duct dilation.  Pancreas: Soft tissue along the posterior pancreas may have diminished in size but obliterates the normal fat about the celiac axis similar to distribution seen on prior study but again perhaps diminished in size. No intrapancreatic lesion or signs of inflammation.  Spleen: Normal  Adrenals/Urinary Tract: Normal adrenal glands.  Symmetric renal enhancement. No hydronephrosis. The LEFT renal pelvic calculus has migrated slightly further into the renal pelvis measuring approximately 8 mm. Increasing stone burden in the LEFT kidney since previous imaging with another calculus in the interpolar calyx and infundibulum approximately 5 mm. Distal ureters are difficult to assess. Urinary bladder is under distended.  Stomach/Bowel: Soft tissue about the hepatic gastric recess measuring approximately 3.4 x 2.1 cm in axial dimension. Less bulky appearance along the LEFT periaortic region where it previously measured approximately 2.3 cm in greatest thickness and now measures 1.3 cm. Greatest maximal dimension of soft tissue on the prior study was approximately 3.9 by 2.6 cm. No sign of small bowel obstruction. The ingested contrast material passes through the small bowel with early opacification of the cecum which is present in the pelvis. Stool filled colon with signs of colonic  diverticulosis. Ascending colonic thickening colonic thickening.  Vascular/Lymphatic: Calcified atheromatous plaque in the abdominal aorta. Aneurysmal dilation of the infrarenal abdominal aorta approximately 2.9 cm greatest caliber similar to the prior study accounting for tortuosity in the axial plane.  Reproductive: Prostate heterogeneous on CT with nonspecific appearance.  Other: Diffuse loss of subcutaneous fat and mesenteric fat since the prior imaging evaluation makes assessment more difficult.  Appendix not visualized without overt signs of acute appendicitis.  Musculoskeletal: No acute musculoskeletal process. Spinal degenerative changes.  IMPRESSION: 1. Findings of bilateral pneumonia with worsening compared to previous imaging. The possibility of background pulmonary metastatic disease is also considered based on previous PET imaging. There is rapid worsening in some areas, particularly the RIGHT upper lobe and LEFT lung base when compared to the previous more recent PET scan which would support a worsening overlay of infection perhaps from aspiration. 2. Ascending colonic thickening may be related to colitis, correlate with any risk factors for infectious colitis in the setting of recent antibiotic administration. 3. Post esophagectomy with gastric pull-through. Patulous appearance of the pull-through which is filled partially with oral contrast media further supporting the possibility of aspiration in  the setting of repeated nausea and vomiting. 4. Decreased soft tissue is suggested in the upper abdomen with limited assessment due to cachexia and early venous phase on the current study. 5. filling of the cecum with ingested contrast material, cecum now in the pelvis. Appendix not visualized. No secondary signs to suggest acute appendicitis. Assessment is however limited due to lack of intra-abdominal fat. If there is continued pain or symptoms that localized  to the pelvis, repeat scanning could be considered after passage of oral contrast further into the colon. 6. Increased stone burden in the LEFT kidney. 7. Aneurysmal dilation of the infrarenal abdominal aorta approximately 2.9 cm greatest caliber similar to the prior study accounting for tortuosity in the axial plane. 8. Cachexia.  Aortic Atherosclerosis (ICD10-I70.0).   Disposition: Home on hospice  Consults: GI, palliative  Discharge Instructions:  1. Follow up with PCP in 1-2 weeks 2. Please obtain BMP/CBC in one week 3. Please follow up with your PCP on the following pending results:  Disease/illness Education: Discussed today  Home Health/Community Services Discussions/Referrals: Hospice in place  Establishment or re-establishment of referral orders for community resources: Hospice in place  Discussion with other health care providers: none  Assessment and Support of treatment regimen adherence: excellent  Appointments Coordinated with: wife  Education for self-management, independent living, and ADLs: Discussed today.  Corey Perry has been OK since he got out of the hospital. He has been sleeping most of the time. He fell about 3:30 this AM, tried to get out of bed, and fell onto the carpet, did not hurt himself. He only has a walker at home and tried to get out of bed. No wheelchair or bedside commode at home. His wife is feeling OK with having him home. She notes that he is not in pain. + coughing and throwing up. Continues to run fevers. Daughter is coming over from [place] this weekend. They are working on FMLA to get her to be able to stay with him. We will fill out anything needed. Hospice has come in 1x so far, and they are still getting situated with them. No other concerns or complaints at this time.   Relevant past medical, surgical, family and social history reviewed and updated as indicated. Interim medical history since our last visit reviewed. Allergies and  medications reviewed and updated.  Review of Systems  Constitutional: Positive for activity change, appetite change, fatigue, fever and unexpected weight change. Negative for chills and diaphoresis.  HENT: Negative.   Respiratory: Negative.   Cardiovascular: Negative.   Gastrointestinal: Positive for nausea and vomiting. Negative for abdominal distention, abdominal pain, anal bleeding, blood in stool, constipation, diarrhea and rectal pain.  Musculoskeletal: Negative.   Skin: Negative.   Neurological: Negative.   Psychiatric/Behavioral: Negative.     Per HPI unless specifically indicated above     Objective:    There were no vitals taken for this visit.  Wt Readings from Last 3 Encounters:  08/16/20 129 lb 4 oz (58.6 kg)  08/09/20 118 lb (53.5 kg)  07/31/20 118 lb (53.5 kg)    Physical Exam Vitals and nursing note reviewed.  Pulmonary:     Effort: Pulmonary effort is normal. No respiratory distress.     Comments: Speaking in full sentences Neurological:     Mental Status: He is alert.  Psychiatric:        Mood and Affect: Mood normal.        Behavior: Behavior normal.  Thought Content: Thought content normal.        Judgment: Judgment normal.     Results for orders placed or performed in visit on 08/16/20  CBC with Differential  Result Value Ref Range   WBC 13.1 (H) 4.0 - 10.5 K/uL   RBC 3.64 (L) 4.22 - 5.81 MIL/uL   Hemoglobin 9.8 (L) 13.0 - 17.0 g/dL   HCT 32.1 (L) 39.0 - 52.0 %   MCV 88.2 80.0 - 100.0 fL   MCH 26.9 26.0 - 34.0 pg   MCHC 30.5 30.0 - 36.0 g/dL   RDW 17.7 (H) 11.5 - 15.5 %   Platelets 254 150 - 400 K/uL   nRBC 0.0 0.0 - 0.2 %   Neutrophils Relative % 86 %   Neutro Abs 11.5 (H) 1.7 - 7.7 K/uL   Lymphocytes Relative 6 %   Lymphs Abs 0.7 0.7 - 4.0 K/uL   Monocytes Relative 5 %   Monocytes Absolute 0.6 0.1 - 1.0 K/uL   Eosinophils Relative 1 %   Eosinophils Absolute 0.1 0.0 - 0.5 K/uL   Basophils Relative 1 %   Basophils Absolute 0.1  0.0 - 0.1 K/uL   Immature Granulocytes 1 %   Abs Immature Granulocytes 0.09 (H) 0.00 - 0.07 K/uL      Assessment & Plan:   Problem List Items Addressed This Visit      Respiratory   Aspiration pneumonia of right middle lobe ([place])    Has finished his antibiotics. Currently on hospice. Continue to follow with oncology and palliative. Call with any concerns.      Acute on chronic respiratory failure with hypoxia (HCC)    Currently on hospice. Continue to follow with oncology and palliative. Call with any concerns.      Aspiration pneumonia of both lungs ([place])    Has finished his antibiotics. Currently on hospice. Continue to follow with oncology and palliative. Call with any concerns.        Digestive   Esophageal cancer [facility])    Currently on hospice. Continue to follow with oncology and palliative. Call with any concerns.      Diabetic gastroparesis ([place])    Currently on hospice. Continue to follow with oncology and palliative. Call with any concerns.       Other Visit Diagnoses    Hospice care patient    -  Primary   Currently comfortable. Hospice is coming in. Continue to monitor. Call with any concerns.        Follow up plan: Return if symptoms worsen or fail to improve.  >40 minutes spent with patient and wife today

## 2020-08-24 NOTE — Telephone Encounter (Signed)
Patients wife notified

## 2020-08-25 ENCOUNTER — Encounter: Payer: Self-pay | Admitting: Family Medicine

## 2020-08-25 DIAGNOSIS — R1310 Dysphagia, unspecified: Secondary | ICD-10-CM | POA: Diagnosis not present

## 2020-08-25 DIAGNOSIS — Z923 Personal history of irradiation: Secondary | ICD-10-CM | POA: Diagnosis not present

## 2020-08-25 DIAGNOSIS — C155 Malignant neoplasm of lower third of esophagus: Secondary | ICD-10-CM | POA: Diagnosis not present

## 2020-08-25 DIAGNOSIS — K3184 Gastroparesis: Secondary | ICD-10-CM | POA: Diagnosis not present

## 2020-08-25 DIAGNOSIS — K219 Gastro-esophageal reflux disease without esophagitis: Secondary | ICD-10-CM | POA: Diagnosis not present

## 2020-08-25 DIAGNOSIS — Z9221 Personal history of antineoplastic chemotherapy: Secondary | ICD-10-CM | POA: Diagnosis not present

## 2020-08-25 NOTE — Assessment & Plan Note (Signed)
Currently on hospice. Continue to follow with oncology and palliative. Call with any concerns.

## 2020-08-25 NOTE — Assessment & Plan Note (Signed)
Has finished his antibiotics. Currently on hospice. Continue to follow with oncology and palliative. Call with any concerns.

## 2020-08-26 DIAGNOSIS — Z9221 Personal history of antineoplastic chemotherapy: Secondary | ICD-10-CM | POA: Diagnosis not present

## 2020-08-26 DIAGNOSIS — R1310 Dysphagia, unspecified: Secondary | ICD-10-CM | POA: Diagnosis not present

## 2020-08-26 DIAGNOSIS — K219 Gastro-esophageal reflux disease without esophagitis: Secondary | ICD-10-CM | POA: Diagnosis not present

## 2020-08-26 DIAGNOSIS — K3184 Gastroparesis: Secondary | ICD-10-CM | POA: Diagnosis not present

## 2020-08-26 DIAGNOSIS — Z923 Personal history of irradiation: Secondary | ICD-10-CM | POA: Diagnosis not present

## 2020-08-26 DIAGNOSIS — C155 Malignant neoplasm of lower third of esophagus: Secondary | ICD-10-CM | POA: Diagnosis not present

## 2020-08-28 DIAGNOSIS — C155 Malignant neoplasm of lower third of esophagus: Secondary | ICD-10-CM | POA: Diagnosis not present

## 2020-08-28 DIAGNOSIS — R1310 Dysphagia, unspecified: Secondary | ICD-10-CM | POA: Diagnosis not present

## 2020-08-28 DIAGNOSIS — Z923 Personal history of irradiation: Secondary | ICD-10-CM | POA: Diagnosis not present

## 2020-08-28 DIAGNOSIS — K3184 Gastroparesis: Secondary | ICD-10-CM | POA: Diagnosis not present

## 2020-08-28 DIAGNOSIS — Z9221 Personal history of antineoplastic chemotherapy: Secondary | ICD-10-CM | POA: Diagnosis not present

## 2020-08-28 DIAGNOSIS — K219 Gastro-esophageal reflux disease without esophagitis: Secondary | ICD-10-CM | POA: Diagnosis not present

## 2020-08-29 DIAGNOSIS — K3184 Gastroparesis: Secondary | ICD-10-CM | POA: Diagnosis not present

## 2020-08-29 DIAGNOSIS — K219 Gastro-esophageal reflux disease without esophagitis: Secondary | ICD-10-CM | POA: Diagnosis not present

## 2020-08-29 DIAGNOSIS — R1310 Dysphagia, unspecified: Secondary | ICD-10-CM | POA: Diagnosis not present

## 2020-08-29 DIAGNOSIS — C155 Malignant neoplasm of lower third of esophagus: Secondary | ICD-10-CM | POA: Diagnosis not present

## 2020-08-29 DIAGNOSIS — Z923 Personal history of irradiation: Secondary | ICD-10-CM | POA: Diagnosis not present

## 2020-08-29 DIAGNOSIS — Z9221 Personal history of antineoplastic chemotherapy: Secondary | ICD-10-CM | POA: Diagnosis not present

## 2020-08-30 DIAGNOSIS — Z923 Personal history of irradiation: Secondary | ICD-10-CM | POA: Diagnosis not present

## 2020-08-30 DIAGNOSIS — Z9221 Personal history of antineoplastic chemotherapy: Secondary | ICD-10-CM | POA: Diagnosis not present

## 2020-08-30 DIAGNOSIS — C155 Malignant neoplasm of lower third of esophagus: Secondary | ICD-10-CM | POA: Diagnosis not present

## 2020-08-30 DIAGNOSIS — K219 Gastro-esophageal reflux disease without esophagitis: Secondary | ICD-10-CM | POA: Diagnosis not present

## 2020-08-30 DIAGNOSIS — R1310 Dysphagia, unspecified: Secondary | ICD-10-CM | POA: Diagnosis not present

## 2020-08-30 DIAGNOSIS — K3184 Gastroparesis: Secondary | ICD-10-CM | POA: Diagnosis not present

## 2020-08-31 ENCOUNTER — Telehealth: Payer: Self-pay | Admitting: Family Medicine

## 2020-08-31 NOTE — Telephone Encounter (Signed)
Copied from [place] [id number]. Topic: Quick Communication - See Telephone Encounter >> 16-Sep-2020 11:26 AM Loma Boston wrote: CRM for notification. See Telephone encounter for: 09/16/2020. Lorriane Shire with [employer] called to inform Dr Lenna Sciara that this pt passed away at 10:30 am on September 16, 2020. >> 2020-09-16  5:15 PM Barth Kirks B wrote: Dr. Wynetta Emery,  Patient passed away 16-Sep-2020 at 2:30pm.  I have marked the patient's chart as deceased.

## 2020-09-01 ENCOUNTER — Telehealth: Payer: Self-pay

## 2020-09-01 NOTE — Telephone Encounter (Signed)
fyi

## 2020-09-01 NOTE — Telephone Encounter (Signed)
Copied from [place] [id number]. Topic: General - Deceased Patient >> 09-24-2020 10:20 AM Rainey Pines A wrote: Patients daughter called to inform Dr. Wynetta Emery that patient passed away on 12/20/2022 but wanted to make sure Dr. Wynetta Emery knew that the family appreciates everything that she has done for them and that patients wife  can be reached at [phone]

## 2020-09-01 NOTE — Telephone Encounter (Signed)
Copied from [place] [id number]. Topic: General - Deceased Patient >> 2020-09-27 10:20 AM Rainey Pines A wrote: Patients daughter called to inform Dr. Wynetta Emery that patient passed away on December 23, 2022 but wanted to make sure Dr. Wynetta Emery knew that the family appreciates everything that she has done for them and that patients wife  can be reached at [phone]

## 2020-09-06 ENCOUNTER — Ambulatory Visit: Payer: Medicare Other | Admitting: Family Medicine

## 2020-09-16 DEATH — deceased

## 2020-09-28 ENCOUNTER — Ambulatory Visit: Payer: Medicare Other | Admitting: Oncology

## 2020-09-28 ENCOUNTER — Encounter: Payer: Medicare Other | Admitting: Hospice and Palliative Medicine

## 2020-09-28 ENCOUNTER — Other Ambulatory Visit: Payer: Medicare Other

## 2020-09-29 NOTE — Addendum Note (Signed)
Addended by: Valerie Roys on: 09/29/2020 08:15 AM   Modules accepted: Level of Service

## 2022-09-16 DEATH — deceased
